# Patient Record
Sex: Female | Born: 1960
Health system: Southern US, Community
[De-identification: ages and names within clinical notes are randomized; demographics above are authoritative.]

## PROBLEM LIST (undated history)

## (undated) DIAGNOSIS — I5042 Chronic combined systolic (congestive) and diastolic (congestive) heart failure: Secondary | ICD-10-CM

## (undated) DIAGNOSIS — K219 Gastro-esophageal reflux disease without esophagitis: Secondary | ICD-10-CM

## (undated) DIAGNOSIS — E669 Obesity, unspecified: Secondary | ICD-10-CM

## (undated) DIAGNOSIS — B019 Varicella without complication: Secondary | ICD-10-CM

## (undated) DIAGNOSIS — I2721 Secondary pulmonary arterial hypertension: Secondary | ICD-10-CM

## (undated) DIAGNOSIS — M1990 Unspecified osteoarthritis, unspecified site: Secondary | ICD-10-CM

## (undated) DIAGNOSIS — G43909 Migraine, unspecified, not intractable, without status migrainosus: Secondary | ICD-10-CM

## (undated) DIAGNOSIS — I255 Ischemic cardiomyopathy: Secondary | ICD-10-CM

## (undated) DIAGNOSIS — G35 Multiple sclerosis: Secondary | ICD-10-CM

## (undated) DIAGNOSIS — I340 Nonrheumatic mitral (valve) insufficiency: Secondary | ICD-10-CM

## (undated) DIAGNOSIS — H469 Unspecified optic neuritis: Secondary | ICD-10-CM

## (undated) DIAGNOSIS — I2510 Atherosclerotic heart disease of native coronary artery without angina pectoris: Secondary | ICD-10-CM

## (undated) DIAGNOSIS — J449 Chronic obstructive pulmonary disease, unspecified: Secondary | ICD-10-CM

## (undated) DIAGNOSIS — Z8619 Personal history of other infectious and parasitic diseases: Secondary | ICD-10-CM

## (undated) DIAGNOSIS — R269 Unspecified abnormalities of gait and mobility: Secondary | ICD-10-CM

## (undated) DIAGNOSIS — H9190 Unspecified hearing loss, unspecified ear: Secondary | ICD-10-CM

## (undated) DIAGNOSIS — R51 Headache: Secondary | ICD-10-CM

## (undated) DIAGNOSIS — T7840XA Allergy, unspecified, initial encounter: Secondary | ICD-10-CM

## (undated) HISTORY — PX: OTHER SURGICAL HISTORY: SHX169

## (undated) HISTORY — DX: Unspecified abnormalities of gait and mobility: R26.9

## (undated) HISTORY — DX: Unspecified optic neuritis: H46.9

## (undated) HISTORY — DX: Multiple sclerosis: G35

## (undated) HISTORY — DX: Obesity, unspecified: E66.9

## (undated) HISTORY — DX: Headache: R51

## (undated) HISTORY — DX: Unspecified osteoarthritis, unspecified site: M19.90

## (undated) HISTORY — DX: Varicella without complication: B01.9

## (undated) HISTORY — DX: Migraine, unspecified, not intractable, without status migrainosus: G43.909

## (undated) HISTORY — PX: TUMOR REMOVAL: SHX12

## (undated) HISTORY — PX: PILONIDAL CYST EXCISION: SHX744

## (undated) HISTORY — DX: Personal history of other infectious and parasitic diseases: Z86.19

## (undated) HISTORY — DX: Allergy, unspecified, initial encounter: T78.40XA

## (undated) HISTORY — DX: Gastro-esophageal reflux disease without esophagitis: K21.9

## (undated) HISTORY — DX: Unspecified hearing loss, unspecified ear: H91.90

## (undated) HISTORY — PX: TUBAL LIGATION: SHX77

---
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
## 1999-03-07 ENCOUNTER — Ambulatory Visit (HOSPITAL_COMMUNITY): Admission: RE | Admit: 1999-03-07 | Discharge: 1999-03-07 | Payer: Self-pay | Admitting: Family Medicine

## 1999-04-05 ENCOUNTER — Ambulatory Visit (HOSPITAL_COMMUNITY): Admission: RE | Admit: 1999-04-05 | Discharge: 1999-04-05 | Payer: Self-pay | Admitting: Specialist

## 1999-04-05 ENCOUNTER — Encounter: Payer: Self-pay | Admitting: Specialist

## 1999-04-08 ENCOUNTER — Inpatient Hospital Stay (HOSPITAL_COMMUNITY): Admission: AD | Admit: 1999-04-08 | Discharge: 1999-04-11 | Payer: Self-pay | Admitting: Neurology

## 2001-03-26 ENCOUNTER — Encounter: Payer: Self-pay | Admitting: Psychiatry

## 2001-03-26 ENCOUNTER — Ambulatory Visit (HOSPITAL_COMMUNITY): Admission: RE | Admit: 2001-03-26 | Discharge: 2001-03-26 | Payer: Self-pay | Admitting: Psychiatry

## 2001-08-30 ENCOUNTER — Emergency Department (HOSPITAL_COMMUNITY): Admission: EM | Admit: 2001-08-30 | Discharge: 2001-08-30 | Payer: Self-pay | Admitting: Emergency Medicine

## 2001-10-24 ENCOUNTER — Encounter: Payer: Self-pay | Admitting: Emergency Medicine

## 2001-10-24 ENCOUNTER — Emergency Department (HOSPITAL_COMMUNITY): Admission: EM | Admit: 2001-10-24 | Discharge: 2001-10-24 | Payer: Self-pay | Admitting: Emergency Medicine

## 2002-08-12 ENCOUNTER — Other Ambulatory Visit: Admission: RE | Admit: 2002-08-12 | Discharge: 2002-08-12 | Payer: Self-pay | Admitting: Obstetrics and Gynecology

## 2003-10-25 ENCOUNTER — Ambulatory Visit (HOSPITAL_COMMUNITY): Admission: RE | Admit: 2003-10-25 | Discharge: 2003-10-25 | Payer: Self-pay | Admitting: Neurology

## 2004-05-09 ENCOUNTER — Encounter (INDEPENDENT_AMBULATORY_CARE_PROVIDER_SITE_OTHER): Payer: Self-pay | Admitting: Cardiology

## 2004-05-09 ENCOUNTER — Ambulatory Visit: Admission: RE | Admit: 2004-05-09 | Discharge: 2004-05-09 | Payer: Self-pay | Admitting: Neurology

## 2004-05-31 ENCOUNTER — Ambulatory Visit (HOSPITAL_COMMUNITY): Admission: RE | Admit: 2004-05-31 | Discharge: 2004-05-31 | Payer: Self-pay | Admitting: Neurology

## 2004-06-13 ENCOUNTER — Encounter (HOSPITAL_COMMUNITY): Admission: RE | Admit: 2004-06-13 | Discharge: 2004-09-11 | Payer: Self-pay | Admitting: Neurology

## 2004-08-27 ENCOUNTER — Encounter (INDEPENDENT_AMBULATORY_CARE_PROVIDER_SITE_OTHER): Payer: Self-pay | Admitting: Cardiology

## 2004-08-27 ENCOUNTER — Ambulatory Visit (HOSPITAL_COMMUNITY): Admission: RE | Admit: 2004-08-27 | Discharge: 2004-08-27 | Payer: Self-pay | Admitting: Psychiatry

## 2006-03-15 ENCOUNTER — Observation Stay (HOSPITAL_COMMUNITY): Admission: EM | Admit: 2006-03-15 | Discharge: 2006-03-17 | Payer: Self-pay | Admitting: Emergency Medicine

## 2006-03-16 ENCOUNTER — Ambulatory Visit: Payer: Self-pay | Admitting: Oncology

## 2006-03-17 ENCOUNTER — Ambulatory Visit: Payer: Self-pay | Admitting: Oncology

## 2006-03-19 ENCOUNTER — Ambulatory Visit (HOSPITAL_COMMUNITY): Admission: RE | Admit: 2006-03-19 | Discharge: 2006-03-19 | Payer: Self-pay | Admitting: Oncology

## 2006-04-02 ENCOUNTER — Ambulatory Visit (HOSPITAL_COMMUNITY): Admission: RE | Admit: 2006-04-02 | Discharge: 2006-04-02 | Payer: Self-pay | Admitting: Oncology

## 2006-04-02 ENCOUNTER — Encounter (INDEPENDENT_AMBULATORY_CARE_PROVIDER_SITE_OTHER): Payer: Self-pay | Admitting: *Deleted

## 2007-11-26 ENCOUNTER — Emergency Department (HOSPITAL_COMMUNITY): Admission: EM | Admit: 2007-11-26 | Discharge: 2007-11-27 | Payer: Self-pay | Admitting: Emergency Medicine

## 2008-07-07 ENCOUNTER — Encounter (HOSPITAL_COMMUNITY): Admission: RE | Admit: 2008-07-07 | Discharge: 2008-10-05 | Payer: Self-pay | Admitting: Neurology

## 2008-10-26 ENCOUNTER — Encounter (HOSPITAL_COMMUNITY): Admission: RE | Admit: 2008-10-26 | Discharge: 2009-01-24 | Payer: Self-pay | Admitting: Neurology

## 2009-02-15 ENCOUNTER — Encounter (HOSPITAL_COMMUNITY): Admission: RE | Admit: 2009-02-15 | Discharge: 2009-05-16 | Payer: Self-pay | Admitting: Neurology

## 2009-05-17 ENCOUNTER — Encounter (HOSPITAL_COMMUNITY): Admission: RE | Admit: 2009-05-17 | Discharge: 2009-08-15 | Payer: Self-pay | Admitting: Neurology

## 2009-09-06 ENCOUNTER — Encounter (HOSPITAL_COMMUNITY): Admission: RE | Admit: 2009-09-06 | Discharge: 2009-12-05 | Payer: Self-pay | Admitting: Neurology

## 2010-01-03 ENCOUNTER — Encounter (HOSPITAL_COMMUNITY): Admission: RE | Admit: 2010-01-03 | Discharge: 2010-04-03 | Payer: Self-pay | Admitting: Neurology

## 2010-04-04 ENCOUNTER — Encounter (HOSPITAL_COMMUNITY): Admission: RE | Admit: 2010-04-04 | Discharge: 2010-07-03 | Payer: Self-pay | Admitting: Neurology

## 2010-07-25 ENCOUNTER — Encounter (HOSPITAL_COMMUNITY)
Admission: RE | Admit: 2010-07-25 | Discharge: 2010-10-16 | Payer: Self-pay | Source: Home / Self Care | Admitting: Neurology

## 2010-10-17 ENCOUNTER — Encounter (HOSPITAL_COMMUNITY)
Admission: RE | Admit: 2010-10-17 | Discharge: 2011-01-07 | Payer: Self-pay | Source: Home / Self Care | Attending: Neurology | Admitting: Neurology

## 2011-01-03 ENCOUNTER — Emergency Department (HOSPITAL_COMMUNITY)
Admission: EM | Admit: 2011-01-03 | Discharge: 2011-01-04 | Payer: Self-pay | Source: Home / Self Care | Admitting: Emergency Medicine

## 2011-01-03 ENCOUNTER — Encounter: Payer: Self-pay | Admitting: Cardiology

## 2011-01-03 LAB — POCT CARDIAC MARKERS: Troponin i, poc: <0.05 ng/mL (ref 0.00–0.09)

## 2011-01-03 LAB — PROTIME-INR: INR: 1.02 (ref 0.00–1.49)

## 2011-01-03 LAB — CBC
HCT: 38.2 % (ref 36.0–46.0)
MCV: 90.5 fL (ref 78.0–100.0)
Platelets: 195 10*3/uL (ref 150–400)
RDW: 15.1 % (ref 11.5–15.5)
WBC: 10.5 10*3/uL (ref 4.0–10.5)

## 2011-01-03 LAB — BASIC METABOLIC PANEL
CO2: 22 mEq/L (ref 19–32)
Chloride: 111 mEq/L (ref 96–112)
Creatinine, Ser: 1.08 mg/dL (ref 0.4–1.2)
Glucose, Bld: 94 mg/dL (ref 70–99)
Potassium: 3.4 mEq/L — ABNORMAL LOW (ref 3.5–5.1)

## 2011-01-03 LAB — DIFFERENTIAL
Eosinophils Relative: 2 % (ref 0–5)
Lymphs Abs: 5.1 10*3/uL — ABNORMAL HIGH (ref 0.7–4.0)
Neutro Abs: 4.5 10*3/uL (ref 1.7–7.7)
Neutrophils Relative %: 43 % (ref 43–77)

## 2011-01-04 LAB — POCT CARDIAC MARKERS
Myoglobin, poc: 51.5 ng/mL (ref 12–200)
Myoglobin, poc: 46.5 ng/mL (ref 12–200)
Troponin i, poc: <0.05 ng/mL (ref 0.00–0.09)
Troponin i, poc: <0.05 ng/mL (ref 0.00–0.09)

## 2011-01-06 ENCOUNTER — Ambulatory Visit
Admission: RE | Admit: 2011-01-06 | Discharge: 2011-01-06 | Payer: Self-pay | Source: Home / Self Care | Attending: Cardiology | Admitting: Cardiology

## 2011-01-06 DIAGNOSIS — F17200 Nicotine dependence, unspecified, uncomplicated: Secondary | ICD-10-CM | POA: Insufficient documentation

## 2011-01-06 DIAGNOSIS — R072 Precordial pain: Secondary | ICD-10-CM | POA: Insufficient documentation

## 2011-01-08 ENCOUNTER — Encounter: Payer: Self-pay | Admitting: Cardiology

## 2011-01-10 ENCOUNTER — Encounter (HOSPITAL_COMMUNITY): Payer: BC Managed Care – PPO | Attending: Neurology

## 2011-01-10 DIAGNOSIS — Z79899 Other long term (current) drug therapy: Secondary | ICD-10-CM | POA: Insufficient documentation

## 2011-01-10 DIAGNOSIS — G35 Multiple sclerosis: Secondary | ICD-10-CM | POA: Insufficient documentation

## 2011-01-15 ENCOUNTER — Encounter: Payer: Self-pay | Admitting: Physician Assistant

## 2011-01-15 NOTE — Assessment & Plan Note (Signed)
Summary: NP6/PT SEEN IN [place] FOR CHEST PAIN BY DR. Rogelia Boga OUR FELLOW/LG   Visit Type:  Initial Consult Primary Provider:  Dr. Marjory Lies  CC:  CAD.  History of Present Illness: The patient presents for evaluation of chest discomfort. She was in the emergency room 2 days ago with a discomfort that started that day. It was mild and started as a stabbing discomfort but became dull and then pressure-like. It was midsternal without radiation. It was unlike previous reflux. She became mildly dizzy and slightly nauseated and then presented to the emergency room. There she had no acute changes on EKG and cardiac markers x3 were negative. She was treated with aspirin but still had some discomfort at the time of discharge from the ER. She has since had no further discomfort over the past 24 hours. She does have risk factors to include family history and tobacco. She denies any shortness of breath, PND or orthopnea. She denies any palpitations, presyncope or syncope. She is slightly limited in activities because of multiple sclerosis. However, she takes stairs and can vacuum without bringing on these symptoms.  Current Medications (verified): 1)  Mobic 15 Mg Tabs (Meloxicam) .Marland Kitchen.. 1 By Mouth Daily 2)  Zyrtec Allergy 10 Mg Tabs (Cetirizine Hcl) .... 1/2 By Mouth Daily 3)  Topiramate 50 Mg Tabs (Topiramate) .... 3 1/2 By Mouth Daily 4)  Ambien 5 Mg Tabs (Zolpidem Tartrate) .... As Needed 5)  Tysabri 300 Mg/65ml Conc (Natalizumab) .... Monthly  Allergies (verified): 1)  ! Codeine 2)  ! Prednisone  Past History:  Past Medical History: Multiple Sclerosis Tobacco user  Past Surgical History: Ganglion cyst resected (left kidney) Cyst removed from cocyx  Family History: Brother with MI age 48  Social History: Divorced, two children, recptionist Cigarettes x 30 years/1 ppd  Review of Systems       Positive for headaches, joint pains, reflux. Otherwise as stated in each eye and negative for  all other systems.  Vital Signs:  Patient profile:   50 year old female Height:      63 inches Weight:      169 pounds BMI:     30.05 Pulse rate:   78 / minute Resp:     18 per minute BP sitting:   149 / 87  (left arm)  Vitals Entered By: Marrion Coy, CNA (January 06, 2011 11:57 AM)  Physical Exam  General:  Well developed, well nourished, in no acute distress. Head:  normocephalic and atraumatic Eyes:  PERRLA/EOM intact; conjunctiva and lids normal. Mouth:  Edentulous. Oral mucosa normal. Neck:  Neck supple, no JVD. No masses, thyromegaly or abnormal cervical nodes. Chest Wall:  no deformities or breast masses noted Lungs:  Clear bilaterally to auscultation and percussion. Abdomen:  Bowel sounds positive; abdomen soft and non-tender without masses, organomegaly, or hernias noted. No hepatosplenomegaly. Msk:  Back normal, normal gait. Muscle strength and tone normal. Extremities:  No clubbing or cyanosis. Neurologic:  Alert and oriented x 3. Skin:  Intact without lesions or rashes. Cervical Nodes:  no significant adenopathy Axillary Nodes:  no significant adenopathy Inguinal Nodes:  no significant adenopathy Psych:  Normal affect.   Detailed Cardiovascular Exam  Neck    Carotids: Carotids full and equal bilaterally without bruits.      Neck Veins: Normal, no JVD.    Heart    Inspection: no deformities or lifts noted.      Palpation: normal PMI with no thrills palpable.  Auscultation: regular rate and rhythm, S1, S2 without murmurs, rubs, gallops, or clicks.    Vascular    Abdominal Aorta: no palpable masses, pulsations, or audible bruits.      Femoral Pulses: normal femoral pulses bilaterally.      Pedal Pulses: normal pedal pulses bilaterally.      Radial Pulses: normal radial pulses bilaterally.      Peripheral Circulation: no clubbing, cyanosis, or edema noted with normal capillary refill.     EKG  Procedure date:  01/03/2011  Findings:      Sinus  rhythm, rate 77, axis within normal limits, intervals within normal limits, no acute ST-T wave changes.  Impression & Recommendations:  Problem # 1:  PRECORDIAL PAIN (ICD-786.51) Her chest pain is somewhat atypical but she does have cardiovascular risk factors. Despite her MS she thinks she could walk on a treadmill. I will order an exercise treadmill test. Orders: Treadmill (Treadmill)  Problem # 2:  TOBACCO ABUSE (ICD-305.1) We spent greater than 3 minutes talking about strategies to quit smoking. She did not tolerate Chantix in the past. I have suggested patches, nicotine lozenge and the electronic cigarette.  Patient Instructions: 1)  Your physician recommends that you schedule a follow-up appointment at the time of your Treadmill 2)  Your physician recommends that you continue on your current medications as directed. Please refer to the Current Medication list given to you today. 3)  Your physician has requested that you have an exercise tolerance test.  For further information please visit [email or web address]  Please also follow instruction sheet, as given.

## 2011-02-04 NOTE — Progress Notes (Signed)
Summary: Current Med List   Current Med List   Imported By: Roderic Ovens 01/29/2011 12:55:47  _____________________________________________________________________  External Attachment:    Type:   Image     Comment:   External Document

## 2011-02-07 ENCOUNTER — Encounter (HOSPITAL_COMMUNITY)
Admission: RE | Admit: 2011-02-07 | Discharge: 2011-02-07 | Disposition: A | Discharge status: Home / Self Care | Payer: BC Managed Care – PPO | Source: Ambulatory Visit | Attending: Neurology | Admitting: Neurology

## 2011-02-07 DIAGNOSIS — G35 Multiple sclerosis: Secondary | ICD-10-CM | POA: Insufficient documentation

## 2011-02-07 DIAGNOSIS — Z79899 Other long term (current) drug therapy: Secondary | ICD-10-CM | POA: Insufficient documentation

## 2011-02-24 LAB — COMPREHENSIVE METABOLIC PANEL
ALT: 12 U/L (ref 0–35)
AST: 12 U/L (ref 0–37)
Albumin: 3.6 g/dL (ref 3.5–5.2)
Alkaline Phosphatase: 51 U/L (ref 39–117)
CO2: 23 mEq/L (ref 19–32)
GFR calc non Af Amer: 55 mL/min — ABNORMAL LOW (ref >60)
Glucose, Bld: 110 mg/dL — ABNORMAL HIGH (ref 70–99)
Total Bilirubin: 0.2 mg/dL — ABNORMAL LOW (ref 0.3–1.2)

## 2011-02-24 LAB — CBC
Hemoglobin: 12.4 g/dL (ref 12.0–15.0)
MCHC: 34 g/dL (ref 30.0–36.0)
MCV: 87.6 fL (ref 78.0–100.0)
RDW: 16.5 % — ABNORMAL HIGH (ref 11.5–15.5)

## 2011-02-24 LAB — DIFFERENTIAL
Basophils Absolute: 0.1 10*3/uL (ref 0.0–0.1)
Eosinophils Relative: 3 % (ref 0–5)
Lymphocytes Relative: 39 % (ref 12–46)
Lymphs Abs: 3.5 10*3/uL (ref 0.7–4.0)
Monocytes Absolute: 0.6 10*3/uL (ref 0.1–1.0)
Neutrophils Relative %: 50 % (ref 43–77)

## 2011-03-07 ENCOUNTER — Encounter (HOSPITAL_COMMUNITY): Payer: BC Managed Care – PPO

## 2011-04-04 ENCOUNTER — Encounter (HOSPITAL_COMMUNITY)
Admission: RE | Admit: 2011-04-04 | Discharge: 2011-04-04 | Disposition: A | Discharge status: Home / Self Care | Payer: BC Managed Care – PPO | Source: Ambulatory Visit | Attending: Neurology | Admitting: Neurology

## 2011-04-04 DIAGNOSIS — G35 Multiple sclerosis: Secondary | ICD-10-CM | POA: Insufficient documentation

## 2011-04-25 NOTE — H&P (Signed)
Anita [name], Anita [name]         ACCOUNT NO.:  [id number]   MEDICAL RECORD NO.:  [id number]          PATIENT TYPE:  INP   LOCATION:  0102                         FACILITY:  [facility]   PHYSICIAN:  Lonia Blood, M.D.DATE OF BIRTH:  [birth year]   DATE OF ADMISSION:  03/15/2006  DATE OF DISCHARGE:                                HISTORY & PHYSICAL   PRIMARY CARE PHYSICIAN:  Dr. Doristine Counter at [facility].   NEUROLOGIST:  Dr. Marlan Palau.   CHIEF COMPLAINT:  Abdominal pain.   HISTORY OF PRESENT ILLNESS:  [name] is a 50 year old  female who presented to the [facility] Emergency Room with  complaints of right lower quadrant, sharp continuous type abdominal pain.  She reports that she was diagnosed with chronic appendicitis in her late  teens and has had intermittent migratory abdominal pain since that time.  Therefore, when her pain began this morning at around 9 she paid little  attention to it.  She reports that her usual abdominal discomfort, however,  is self-limited and disappears within a number of minutes if not hours at  most.  The patient's pain today was different because it was continuous.  It  did not radiate.  It did not improve.  Because her pain continued all  morning, the family decided that she should be evaluated and encouraged her  to present to the emergency room.  In the emergency room, urinalysis was  done which was not consistent with a significant urinary tract infection.  Urine pregnancy was found to be negative.  CT scan of the abdomen was  obtained and revealed a large left-sided retroperitoneal mass.  I am asked  to evaluate the patient for this issue.   At the present time, the patient's abdominal discomfort has resolved.  She  says that it has responded well to the Dilaudid she was given in the  emergency room.  The patient is not nauseated or vomiting.  She is not  having diarrhea or severe constipation.   There has been no unintentional  weight loss or weight gain.  There has been no change in appetite.  There  have been no flushing episodes.  There has been no unexplained diaphoresis.  There have been no palpitations or extreme nervousness.  The patient's  family reports that she has been in her usual state of health and agrees  with the patient in supporting this.   REVIEW OF SYSTEMS:  Comprehensive review of systems is wholly unremarkable  with the exception that was noted in the history of present illness above.   PAST MEDICAL HISTORY:  1.  Multiple sclerosis which is presently quiescent and for which the      patient takes no medications.  2.  Tobacco abuse in the amount of one pack per day since the patient was a      teenager.  3.  Chronic appendicitis since late teens.  4.  Right knee cyst followed by Dr. Priscille Kluver.   MEDICATIONS:  1.  Relafen.  2.  Allegra.   ALLERGIES:  CODEINE and PREDNISONE.   FAMILY HISTORY:  The  patient's mother is alive and has hypertension,  Meniere's disease and diet controlled diabetes.  The patient's father is  alive and has COPD.  The patient has a brother with coronary artery disease  who suffered a very large MI in his early 60s.  The patient has a sister who  was recently diagnosed with possibly ovarian cancer.  The patient has  another sister who is bipolar and committed suicide.   SOCIAL HISTORY:  The patient lives in the [place] area.  She does not  drink.   LABORATORY DATA:  Urinalysis reveals moderate hemoglobin, 0 to 2 white blood  cells and red cells, and small leukocyte esterase.  Urine pregnancy is  negative.  CT scan of the abdomen reveals a large retroperitoneal mass  displacing the left adrenal gland/kidney measuring 6.6 x 6 cm roughly.  Hemoglobin, white count, platelet count, MCV are all normal.  Electrolytes  are normal and balanced.  Serum glucose is normal.  Albumin is 3.6 with  total protein of 6.7.  Coagulases are  normal.   PHYSICAL EXAMINATION:  VITAL SIGNS:  Temperature 99.1, blood pressure  149/98, respiratory rate 20, heart rate 76, O2 saturation 99% on room air.  Weight is 190.  GENERAL:  Well-developed, well-nourished mildly overweight female in no  acute respiratory distress.  HEENT:  Normocephalic and atraumatic.  Pupils are equal, round, and reactive  to light and accommodation.  Extraocular movements intact bilaterally.  OC/OP clear.  NECK:  No JVD and no lymphadenopathy.  LUNGS:  Clear to auscultation bilaterally without wheezes or rhonchi.  CARDIOVASCULAR:  Regular rate and rhythm without murmur, rub, or gallop.  Normal S1 and S2.  ABDOMEN:  Mildly overweight.  Bowel sounds positive.  No hepatosplenomegaly,  no rebound and no ascites.  EXTREMITIES:  No significant clubbing, cyanosis, or edema bilateral lower  extremities.  NEUROLOGICAL:  Nonfocal.  The patient was alert and oriented x4.  Cranial  nerves II through XII intact bilaterally.  5/5 strength is displayed in  bilateral upper and lower extremities.   IMPRESSION/PLAN:  1.  Large retroperitoneal mass:  This definitely has the appearance of a      neoplastic lesion.  Primary elements on the differential would be      pheochromocytoma, lymphoma or other solid organ tumors.  We will begin      workup with 24-hour urine for metanephrine and catecholamines.  If this      does not reveal evidence of pheochromocytoma, we will then pursue CT      scan guided biopsy of the lesion. I am holding off on further scanning      until we have a tissue biopsy on the off chance that this does not      represent a neoplasm.  I have spent lengthy amount of time at the      patient's bedside discussing the possibility and the differential and      detailing possible courses of treatment to be pursued based upon our      results.  2.  Multiple sclerosis:  The patient's multiple sclerosis appears to be very     well controlled at present.  The  patient is not on any multiple      sclerosis altering medications.  We will simply follow the patient.  If      her multiple sclerosis becomes active, we will consult Dr. Anne Hahn for      evaluation.  3.  Tobacco abuse:  I have not  counseled the patient concerning her tobacco      abuse at present because of the weight of the recent diagnosis that I      have just given her.  We will encourage this, however, during her      hospital stay.      Lonia Blood, M.D.  Electronically Signed     JTM/MEDQ  D:  03/15/2006  T:  03/16/2006  Job:  [id number]   cc:   Marjory Lies, M.D.  Fax: [phone]   C. Lesia Sago, M.D.  Fax: [phone]

## 2011-04-25 NOTE — Consult Note (Signed)
Anita [name], Anita [name]         ACCOUNT NO.:  [id number]   MEDICAL RECORD NO.:  [id number]          PATIENT TYPE:  INP   LOCATION:  1416                         FACILITY:  [facility]   PHYSICIAN:  Pierce Crane, M.D.      DATE OF BIRTH:  26-Aug-1961   DATE OF CONSULTATION:  03/19/2006  DATE OF DISCHARGE:  03/17/2006                                   CONSULTATION   CONSULTING PHYSICIAN:  Dr. Donnie Coffin   REASON FOR CONSULTATION:  Retroperitoneal mass.   REFERRING PHYSICIAN:  Dr. Lavera Guise   HISTORY OF PRESENT ILLNESS:  Anita [name] is a pleasant 50 year old white  female with a history of multiple sclerosis and a history of tobacco use  found to have a large retroperitoneal mass displacing the left adrenal gland  and kidney measuring about 6.6 x 6 cm suspicious for neoplasm as she was  being admitted with recurrent abdominal pain for which a CT of the abdomen  had been performed.  Few small retroperitoneal lymph nodes were identified.  Per radiology report differential diagnosis would include retroperitoneal  sarcoma versus lymphoma.  CT of the pelvis was negative.  No other radiology  studies are available for review at this time.  No tissue diagnosis at this  time.  We were asked to see the patient anticipating that she will need  follow-up regarding potential disease.   PAST MEDICAL HISTORY:  1.  Multiple sclerosis on no medications diagnosed in May of 2000.  2.  History of what is described by her as chronic appendicitis since the      1980s.  3.  Chronic tobacco use.  4.  Right knee pilonidal cyst followed by Dr. Priscille Kluver  5.  Seasonal allergies.  6.  History of optic neuritis in the past.  7.  Insomnia.   SURGERY:  Status post pilonidal cyst removal 20 years ago.   ALLERGIES:  CODEINE and PREDNISONE.   CURRENT MEDICATIONS:  Dulcolax, Dilaudid, Ativan, Maalox, Zofran,  Roxicodone, and Phenergan p.r.n.  The patient was on no medications on  admission.   REVIEW OF SYSTEMS:   Remarkable for fatigue, migraine headaches, and  intermittent abdominal pain secondary for what she describes as chronic  appendicitis.  No medical records available for review regarding this  diagnosis.  No GI or GU complaints.  She continues to smoke.   FAMILY HISTORY:  Mother alive with a history of diabetes.  Father alive with  COPD.  One sister alive with a history of ovarian cancer.  One sister died  secondary to bipolar disorder complicated with suicide.  One brother alive  status post MI in his 70s.  No history of lymphoma in the family.   SOCIAL HISTORY:  Patient is married.  She is working the account in  department of a firm.  No alcohol history.  She smokes one pack of tobacco a  day for at least 20 years.  Lives in [place].   PHYSICAL EXAMINATION:  GENERAL:  This is a moderately obese 50 year old  white female in no acute distress, alert and oriented x3.  VITAL SIGNS:  Blood pressure 108/65, pulse 74, respirations 17,  temperature  97.8, pulse oxygen saturation 98% on room air, weight 190 pounds, height 5  feet 4 inches.  HEENT:  Normocephalic, atraumatic.  PERRLA.  Mucous without thrush or  lesions.  NECK:  Supple.  No cervical or supraclavicular masses.  LUNGS:  Clear to auscultation bilaterally.  No axillary masses.  CARDIOVASCULAR:  Regular rate and rhythm without murmurs, rubs, or gallops.  ABDOMEN:  Soft, nontender.  Bowel sounds x4.  Cannot palpate the  retroperitoneal mass mentioned.  No palpable spleen or liver.  GENITOURINARY:  Deferred.  RECTAL:  Deferred.  EXTREMITIES:  No clubbing or cyanosis.  No edema.  SKIN:  Without lesions, bruising, or petechiae.  NEUROLOGIC:  Nonfocal.   Last hemoglobin 13.3, hematocrit 38.8, white count 9.8, platelets 271,  neutrophils 5.1, MCV 83.3. PT 12.3, PTT 12.8, INR 0.9.  LDH 131.  Sodium  139, potassium 3.7, BUN 9, creatinine 1, glucose 90, total bilirubin 0.6,  alkaline phosphatase 66, AST 19, ALT 18, total protein 6.7,  albumin 3.6,  calcium 9.4.  CEA 1.9.  Pregnancy test negative.  UA shows moderate blood.  Urinary culture negative.  Metanephrines are pending.   ASSESSMENT/PLAN:  Dr. Donnie Coffin has seen and evaluated the patient and the chart  has been reviewed.  This is a 50 year old white female asked to see for  evaluation of a retroperitoneal mass suspicious for neoplasm.  Dr. Donnie Coffin has  recommended that a tissue diagnosis because made in order to proceed with  recommendations.  She may benefit from a PET CT done at [facility]  to rule out occult malignancy.  If the patient is being discharged prior to  all the results being available I will be glad to follow the patient in  outpatient basis at the [facility], once again, if the diagnosis  of malignancy has been made.  Thank you very much for allowing [place] the  opportunity to participate in the care of Anita [name].      Marlowe Kays, P.A.      Pierce Crane, M.D.  Electronically Signed    SW/MEDQ  D:  03/19/2006  T:  03/19/2006  Job:  [id number]   cc:   Marlan Palau, M.D.  Fax: [phone]   Marjory Lies, M.D.  Fax: [phone]

## 2011-04-25 NOTE — Discharge Summary (Signed)
NAMECLEONE, [name]         ACCOUNT NO.:  [id number]   MEDICAL RECORD NO.:  [id number]          PATIENT TYPE:  INP   LOCATION:  1416                         FACILITY:  [facility]   PHYSICIAN:  Lonia Blood, M.D.       DATE OF BIRTH:  1961-02-05   DATE OF ADMISSION:  03/15/2006  DATE OF DISCHARGE:  03/17/2006                                 DISCHARGE SUMMARY   PRIMARY CARE PHYSICIAN:  [facility].   DISCHARGE DIAGNOSES:  1.  Left retroperitoneal mass.  2.  Abdominal pain, resolved.  3.  Multiple sclerosis.  4.  Tobacco abuse.  5.  History of chronic appendicitis.   DISCHARGE MEDICATIONS:  Allegra daily.   PROCEDURES DURING ADMISSION:  Patient had a computerized tomography scan of  the abdomen that showed a left retroperitoneal mass.   CONSULTATIONS:  Patient was seen in consultation by Dr. Donnie Coffin with oncology.   CONDITION ON DISCHARGE:  Anita [name] was discharged in good condition.   At the time of discharge, she was instructed to follow up with Dr. Donnie Coffin  with oncology.  She will also have a PET CT done at [facility] if  the insurance company will allow it.  Patient will need to follow up with  her primary care physician as well as Dr. Donnie Coffin for the results of the 24-  hour urine collection for metanephrines, mandelic acid, and creatinine.   For admission history and physical, refer to the dictated H&P done by Dr.  Sharon Seller.   HOSPITAL COURSE:  Problem 1:  Left retroperitoneal mass:  Anita [name] was  admitted, and she had a 24-hour urine collection for metanephrines.  She was  seen in consultation by Dr. Donnie Coffin from oncology.  At this point in time, the  workup for her left retroperitoneal mass will be completed as an outpatient  with possible biopsy versus  excision versus further testing, PET scan.  Problem 2:  Abdominal pain:  Anita [name] had no recurrence of her  abdominal pain in the hospital.  Problem 3:  Multiple sclerosis:  [name] will continue her treatment  as needed with Dr. Anne Hahn.      Lonia Blood, M.D.  Electronically Signed     SL/MEDQ  D:  03/17/2006  T:  03/17/2006  Job:  [id number]   cc:   Pierce Crane, M.D.  Fax: [phone]   Marjory Lies, M.D.  Fax: [phone]   C. Lesia Sago, M.D.  Fax: [phone]

## 2011-05-02 ENCOUNTER — Encounter (HOSPITAL_COMMUNITY): Payer: BC Managed Care – PPO

## 2011-09-12 LAB — CBC
HCT: 37.9
Hemoglobin: 13.4
MCHC: 35.3
Platelets: 288
RBC: 4.41
WBC: 10.2

## 2011-09-12 LAB — COMPREHENSIVE METABOLIC PANEL
ALT: 16
AST: 17
CO2: 24
Calcium: 9.3
Chloride: 105
Potassium: 3.6
Sodium: 137

## 2011-09-12 LAB — DIFFERENTIAL
Basophils Relative: 0
Lymphs Abs: 3.4
Neutro Abs: 6

## 2012-02-19 DIAGNOSIS — Z5181 Encounter for therapeutic drug level monitoring: Secondary | ICD-10-CM | POA: Insufficient documentation

## 2012-03-26 DIAGNOSIS — G35 Multiple sclerosis: Secondary | ICD-10-CM | POA: Insufficient documentation

## 2012-03-26 DIAGNOSIS — R209 Unspecified disturbances of skin sensation: Secondary | ICD-10-CM | POA: Insufficient documentation

## 2012-03-26 DIAGNOSIS — M549 Dorsalgia, unspecified: Secondary | ICD-10-CM | POA: Insufficient documentation

## 2012-03-26 DIAGNOSIS — M79609 Pain in unspecified limb: Secondary | ICD-10-CM | POA: Insufficient documentation

## 2012-03-26 DIAGNOSIS — H469 Unspecified optic neuritis: Secondary | ICD-10-CM | POA: Insufficient documentation

## 2013-02-24 ENCOUNTER — Telehealth: Payer: Self-pay | Admitting: Neurology

## 2013-02-24 NOTE — Telephone Encounter (Signed)
Our old records indicate that the patient has an intolerance to prednisone, not an allergy. This was documented in may of 2011, but I see nothing that correlates with any conversation with the patient at around that time. I have asked the home health nurse to reduce the Solu-Medrol dose to 250 mg daily for 4 days, rather than the 500 mg dose. They will contact me if there are any problems.

## 2013-02-24 NOTE — Telephone Encounter (Signed)
Larita Fife called from [facility] and wanted to relay that pt has allerg to prednisone.  Wanting clarification about giving the IV solumedrol to the pt.  I called and spoke to pt and then Lyla Son, RN from [facility] at the home and pt has had type of rage, psychosis? , pt found in bed lying looking like in shock when she has had oral prednisone before.  I consulted Dr. Anne Hahn and he is to call and speak to pt and Lyla Son, Charity fundraiser.

## 2013-02-28 ENCOUNTER — Telehealth: Payer: Self-pay | Admitting: Neurology

## 2013-02-28 NOTE — Telephone Encounter (Addendum)
Anita [name] called and is letting us know that pt did improve with IV solumedrol treatments.  (250mg  IV x 4 days).  Is ambulating now along with upper thigh pain dissipated.  Pt wanting to go back to work [employer]).  FYI

## 2013-02-28 NOTE — Telephone Encounter (Signed)
Pt is back at work.  I called pt at work.   She was away from desk.   She may call if needed. ssy

## 2013-02-28 NOTE — Telephone Encounter (Signed)
Noted. The patient may return to work if she feels ready.

## 2013-03-02 ENCOUNTER — Telehealth: Payer: Self-pay | Admitting: *Deleted

## 2013-03-02 ENCOUNTER — Ambulatory Visit (INDEPENDENT_AMBULATORY_CARE_PROVIDER_SITE_OTHER): Payer: BC Managed Care – PPO | Admitting: Neurology

## 2013-03-02 ENCOUNTER — Encounter: Payer: Self-pay | Admitting: Neurology

## 2013-03-02 VITALS — BP 143/98 | HR 109

## 2013-03-02 DIAGNOSIS — M549 Dorsalgia, unspecified: Secondary | ICD-10-CM

## 2013-03-02 DIAGNOSIS — H469 Unspecified optic neuritis: Secondary | ICD-10-CM

## 2013-03-02 DIAGNOSIS — M79609 Pain in unspecified limb: Secondary | ICD-10-CM

## 2013-03-02 DIAGNOSIS — Z5181 Encounter for therapeutic drug level monitoring: Secondary | ICD-10-CM

## 2013-03-02 DIAGNOSIS — G35 Multiple sclerosis: Secondary | ICD-10-CM

## 2013-03-02 DIAGNOSIS — R209 Unspecified disturbances of skin sensation: Secondary | ICD-10-CM

## 2013-03-02 MED ORDER — DEXAMETHASONE 0.5 MG PO TABS: ORAL_TABLET | ORAL | Status: DC

## 2013-03-02 NOTE — Progress Notes (Signed)
   Reason for visit: Multiple sclerosis  Anita [name] is an 53 y.o. female  History of present illness:  Anita [name] is a 52 year old right-handed white female with a history of multiple sclerosis. The patient in the past had been on Tysabri, but she was taken off the medication as she was JC antibody positive. The patient has elected not to be on any medications for her multiple sclerosis. The patient has done relatively well over the last year, but within the last 3 weeks, the patient began having issues with ambulation. The patient feels as if there is some weakness of the right greater than left lower extremities. The patient has not had any falls, but it is difficult for her to ambulate. The patient has had some urinary frequency, but no urinary incontinence. The patient has had some constipation problems. The patient denies any changes in the way her arms are working, and she denies any visual changes, speech changes, or problems swallowing. The patient was placed on a four-day course of Solu-Medrol, and she had excellent benefit, but she has since relapsed over the last 1 or 2 days. The patient returns to this office for an evaluation.  ROS:  Out of a complete 14 system review of symptoms, the patient complains only of the following symptoms, and all other reviewed systems are negative.  Gait disturbance Weakness Constipation  Blood pressure 143/98, pulse 109.  Physical Exam  General: The patient is alert and cooperative at the time of the examination. The patient is moderately obese.  Skin: No significant peripheral edema is noted.   Neurologic Exam  Cranial nerves: Facial symmetry is present. Speech is normal, no aphasia or dysarthria is noted. Extraocular movements are full. Visual fields are full. Pupils are equal, round, and reactive to light. Discs are flat bilaterally.  Motor: The patient has good strength in all 4 extremities to direct  testing.  Coordination: The patient has good finger-nose-finger and heel-to-shin bilaterally.  Gait and station: The patient has a wide-based gait, unsteady and slow. Tandem gait is poor. Romberg is negative, but it is unsteady.. No drift is seen.  Reflexes: Deep tendon reflexes are symmetric.   Assessment/Plan:  1. Multiple sclerosis  2. Gait disturbance  The patient will require another tapering course of steroids. In the past, the patient has been intolerant of prednisone. The patient will be placed on dexamethasone with a taper over 10-12 days. The patient will need to decide about instituting a disease modifying medication. We have discussed possibly going on Gilenya. Information was given to the patient, and she is to contact our office in the near future about her decision to initiate the medication. The patient will followup through this office in 3 months. If the gait does not improve, physical therapy will be ordered. Blood work will be done today.  Marlan Palau MD 03/02/2013 4:23 PM

## 2013-03-02 NOTE — Telephone Encounter (Signed)
The patient will be seen in our office today. Please refer to this revisit note.

## 2013-03-02 NOTE — Patient Instructions (Signed)
  Dexamethasone taper over the next 10 to 12 days. Need to decide on therapy for the MS.

## 2013-03-02 NOTE — Telephone Encounter (Signed)
Patient called stating she needs to speak with physician concerning her legs because she thinks she back to square one when the flare up begin.

## 2013-03-14 ENCOUNTER — Telehealth: Payer: Self-pay | Admitting: *Deleted

## 2013-03-14 DIAGNOSIS — G35 Multiple sclerosis: Secondary | ICD-10-CM

## 2013-03-14 MED ORDER — CORTICOTROPIN 80 UNIT/ML IJ GEL: 80.0000 [IU] | Freq: Every day | INTRAMUSCULAR | Status: DC

## 2013-03-14 NOTE — Telephone Encounter (Signed)
I called patient. The patient is having ongoing problems with leg weakness, and difficulty with the arm on the right. The patient is finding it difficult to work. The patient indicated no benefit from the dexamethasone. We will give a trial on ACTHAR. The patient will receive 80 units intramuscularly daily for 2 weeks. The patient may pursue disability through work. The patient needs to make a decision on her medical therapy, possibly going on Gilenya. The patient has not been on any disease modifying agents for some time.

## 2013-03-14 NOTE — Telephone Encounter (Signed)
Patient called stating she is finishing the steroid and her condition isn't getting any better. Patient is having problems with her right arm, and having congnitive issues.

## 2013-04-12 ENCOUNTER — Telehealth: Payer: Self-pay | Admitting: Neurology

## 2013-04-12 DIAGNOSIS — G35 Multiple sclerosis: Secondary | ICD-10-CM

## 2013-04-12 NOTE — Telephone Encounter (Signed)
Pt here has questions about gilenya.  She was told that she would be starting gilenya.    Anita [name], gave her information about Velora Heckler, had her sign service request form.  S he needs to have lab work drawn, this she would do today.  Needs orders for cardiac consult and opthamology exam.  Gave letter to her for her eye doctor re: gilenya.  Form filled out and will fax today to [employer].

## 2013-04-12 NOTE — Addendum Note (Signed)
Addended by: Guy Begin on: 04/12/2013 02:02 PM   Modules accepted: Orders

## 2013-04-12 NOTE — Telephone Encounter (Signed)
Orders done for pt relating to gilenya workup.  (amb referral for cardiology and opthamology).

## 2013-04-13 ENCOUNTER — Telehealth: Payer: Self-pay | Admitting: Neurology

## 2013-04-13 NOTE — Telephone Encounter (Signed)
No one call the patient.

## 2013-04-14 ENCOUNTER — Ambulatory Visit: Payer: BC Managed Care – PPO | Attending: Neurology | Admitting: Physical Therapy

## 2013-04-14 DIAGNOSIS — R269 Unspecified abnormalities of gait and mobility: Secondary | ICD-10-CM | POA: Insufficient documentation

## 2013-04-14 DIAGNOSIS — M6281 Muscle weakness (generalized): Secondary | ICD-10-CM | POA: Insufficient documentation

## 2013-04-14 DIAGNOSIS — IMO0001 Reserved for inherently not codable concepts without codable children: Secondary | ICD-10-CM | POA: Insufficient documentation

## 2013-04-14 DIAGNOSIS — Z9181 History of falling: Secondary | ICD-10-CM | POA: Insufficient documentation

## 2013-04-15 ENCOUNTER — Telehealth: Payer: Self-pay | Admitting: Neurology

## 2013-04-20 ENCOUNTER — Ambulatory Visit: Payer: BC Managed Care – PPO

## 2013-04-20 ENCOUNTER — Encounter: Payer: Self-pay | Admitting: Neurology

## 2013-04-20 ENCOUNTER — Telehealth: Payer: Self-pay

## 2013-04-20 NOTE — Telephone Encounter (Signed)
Catina from [employer] called and left a message saying Acthar was denied by insurance.  They would like a letter of medical necessity to try and appeal denial, if the provider wishes to proceed with appeal.  If the letter is written, they would like ot faxed to [phone].  Any questions, call back number is [phone]

## 2013-04-20 NOTE — Telephone Encounter (Signed)
I'll write a letter of medical necessity.

## 2013-04-22 ENCOUNTER — Ambulatory Visit: Payer: BC Managed Care – PPO

## 2013-04-27 ENCOUNTER — Ambulatory Visit: Payer: BC Managed Care – PPO

## 2013-04-29 ENCOUNTER — Ambulatory Visit: Payer: BC Managed Care – PPO | Admitting: Physical Therapy

## 2013-05-04 ENCOUNTER — Ambulatory Visit: Payer: BC Managed Care – PPO | Admitting: Physical Therapy

## 2013-05-06 ENCOUNTER — Ambulatory Visit: Payer: BC Managed Care – PPO | Admitting: Physical Therapy

## 2013-05-06 ENCOUNTER — Telehealth: Payer: Self-pay | Admitting: *Deleted

## 2013-05-06 NOTE — Telephone Encounter (Signed)
Noted. The patient will hopefully contact her office when she desires to go on Gilenya.

## 2013-05-06 NOTE — Telephone Encounter (Signed)
Toniann Fail from [employer] in today to go over [place] work up progress.   She relayed that pt stated to her that she was not going to proceed with gilenya at this time due to could not handle all that was going on in her life at this time.  Case closed, can reopen if and when pt decides further course in the future.  FYI

## 2013-05-09 ENCOUNTER — Ambulatory Visit: Payer: BC Managed Care – PPO | Attending: Neurology | Admitting: Physical Therapy

## 2013-05-09 DIAGNOSIS — R269 Unspecified abnormalities of gait and mobility: Secondary | ICD-10-CM | POA: Insufficient documentation

## 2013-05-09 DIAGNOSIS — M6281 Muscle weakness (generalized): Secondary | ICD-10-CM | POA: Insufficient documentation

## 2013-05-09 DIAGNOSIS — Z9181 History of falling: Secondary | ICD-10-CM | POA: Insufficient documentation

## 2013-05-09 DIAGNOSIS — IMO0001 Reserved for inherently not codable concepts without codable children: Secondary | ICD-10-CM | POA: Insufficient documentation

## 2013-05-11 ENCOUNTER — Ambulatory Visit: Payer: BC Managed Care – PPO | Admitting: Occupational Therapy

## 2013-05-11 ENCOUNTER — Ambulatory Visit: Payer: BC Managed Care – PPO | Admitting: Speech Pathology

## 2013-05-11 ENCOUNTER — Ambulatory Visit: Payer: BC Managed Care – PPO | Admitting: Physical Therapy

## 2013-05-16 ENCOUNTER — Ambulatory Visit: Payer: BC Managed Care – PPO | Admitting: Physical Therapy

## 2013-05-16 ENCOUNTER — Telehealth: Payer: Self-pay | Admitting: Nurse Practitioner

## 2013-05-16 NOTE — Telephone Encounter (Signed)
Debbie from [facility] [phone] is calling to tell us she needs someone to call and authorize her new medication.  ASAP please

## 2013-05-16 NOTE — Telephone Encounter (Signed)
Rx has been faxed to Pharmacy at [phone] per Lafonda Mosses at [facility]

## 2013-05-17 ENCOUNTER — Telehealth: Payer: Self-pay | Admitting: Nurse Practitioner

## 2013-05-17 NOTE — Telephone Encounter (Signed)
Please call Debbie back regarding this prescription---Acthar HP 80 unit/ML injection---conflicting directions.

## 2013-05-18 ENCOUNTER — Ambulatory Visit: Payer: BC Managed Care – PPO | Admitting: Physical Therapy

## 2013-05-18 ENCOUNTER — Ambulatory Visit: Payer: BC Managed Care – PPO | Admitting: Occupational Therapy

## 2013-05-20 ENCOUNTER — Ambulatory Visit: Payer: BC Managed Care – PPO | Admitting: *Deleted

## 2013-05-20 ENCOUNTER — Ambulatory Visit: Payer: BC Managed Care – PPO | Admitting: Occupational Therapy

## 2013-05-23 ENCOUNTER — Ambulatory Visit: Payer: BC Managed Care – PPO

## 2013-05-25 ENCOUNTER — Ambulatory Visit: Payer: BC Managed Care – PPO | Admitting: Physical Therapy

## 2013-05-25 ENCOUNTER — Ambulatory Visit: Payer: BC Managed Care – PPO

## 2013-05-30 ENCOUNTER — Ambulatory Visit: Payer: BC Managed Care – PPO | Admitting: Physical Therapy

## 2013-06-01 ENCOUNTER — Ambulatory Visit: Payer: BC Managed Care – PPO | Admitting: Physical Therapy

## 2013-06-06 ENCOUNTER — Ambulatory Visit: Payer: BC Managed Care – PPO | Admitting: Physical Therapy

## 2013-06-08 ENCOUNTER — Ambulatory Visit: Payer: BC Managed Care – PPO | Attending: Neurology | Admitting: Occupational Therapy

## 2013-06-08 ENCOUNTER — Ambulatory Visit: Payer: BC Managed Care – PPO | Admitting: Physical Therapy

## 2013-06-08 ENCOUNTER — Ambulatory Visit: Payer: BC Managed Care – PPO | Admitting: Speech Pathology

## 2013-06-08 DIAGNOSIS — R269 Unspecified abnormalities of gait and mobility: Secondary | ICD-10-CM | POA: Insufficient documentation

## 2013-06-08 DIAGNOSIS — M6281 Muscle weakness (generalized): Secondary | ICD-10-CM | POA: Insufficient documentation

## 2013-06-08 DIAGNOSIS — IMO0001 Reserved for inherently not codable concepts without codable children: Secondary | ICD-10-CM | POA: Insufficient documentation

## 2013-06-08 DIAGNOSIS — Z9181 History of falling: Secondary | ICD-10-CM | POA: Insufficient documentation

## 2013-06-13 ENCOUNTER — Ambulatory Visit: Payer: BC Managed Care – PPO | Admitting: Physical Therapy

## 2013-06-13 ENCOUNTER — Ambulatory Visit: Payer: BC Managed Care – PPO | Admitting: Occupational Therapy

## 2013-06-13 ENCOUNTER — Ambulatory Visit: Payer: BC Managed Care – PPO | Admitting: Speech Pathology

## 2013-06-15 ENCOUNTER — Ambulatory Visit: Payer: BC Managed Care – PPO | Admitting: Occupational Therapy

## 2013-06-15 ENCOUNTER — Ambulatory Visit: Payer: BC Managed Care – PPO | Admitting: Physical Therapy

## 2013-06-15 ENCOUNTER — Ambulatory Visit: Payer: BC Managed Care – PPO

## 2013-06-20 ENCOUNTER — Ambulatory Visit: Payer: BC Managed Care – PPO

## 2013-06-20 ENCOUNTER — Ambulatory Visit: Payer: BC Managed Care – PPO | Admitting: Occupational Therapy

## 2013-06-22 ENCOUNTER — Ambulatory Visit: Payer: BC Managed Care – PPO | Admitting: Occupational Therapy

## 2013-06-22 ENCOUNTER — Ambulatory Visit: Payer: BC Managed Care – PPO

## 2013-06-28 ENCOUNTER — Telehealth: Payer: Self-pay | Admitting: *Deleted

## 2013-06-28 NOTE — Telephone Encounter (Signed)
Medication reaction to Acthar noted.

## 2013-06-28 NOTE — Telephone Encounter (Signed)
Received call from [employer] about not being able to get in touch with pt about upcoming deliver of med. Pt then proceeded to tell me that she only took for 10days and then had a reaction of throat swelling and coughing up blood.  I called and spoke to [facility] with [employer] and gave her the information pt is not taking anymore and had adverse reaction.  Gave her as much information about reaction and details as I could from pts chart and her conversation with me.

## 2013-07-06 ENCOUNTER — Other Ambulatory Visit: Payer: Self-pay | Admitting: Neurology

## 2013-07-26 ENCOUNTER — Ambulatory Visit (INDEPENDENT_AMBULATORY_CARE_PROVIDER_SITE_OTHER): Payer: BC Managed Care – PPO | Admitting: Neurology

## 2013-07-26 ENCOUNTER — Encounter: Payer: Self-pay | Admitting: Neurology

## 2013-07-26 VITALS — BP 150/93 | HR 74 | Wt 147.0 lb

## 2013-07-26 DIAGNOSIS — G35 Multiple sclerosis: Secondary | ICD-10-CM

## 2013-07-26 DIAGNOSIS — Z5181 Encounter for therapeutic drug level monitoring: Secondary | ICD-10-CM

## 2013-07-26 DIAGNOSIS — R269 Unspecified abnormalities of gait and mobility: Secondary | ICD-10-CM

## 2013-07-26 HISTORY — DX: Unspecified abnormalities of gait and mobility: R26.9

## 2013-07-26 MED ORDER — MELOXICAM 15 MG PO TABS: 15.0000 mg | ORAL_TABLET | Freq: Every day | ORAL | Status: DC

## 2013-07-26 NOTE — Progress Notes (Signed)
Reason for visit: Multiple sclerosis  Anita [name] is an 52 y.o. female  History of present illness:  Anita [name] is a 52 year old right-handed white female with a history of multiple sclerosis. The patient has not wished to be on disease modifying agents over the last several years, and she has begun to have significant progression of her disease within the last 6 months. The patient has developed a gait disorder, and she likely is developing some mild cognitive impairment as well. The patient reports some occasional fecal incontinence. The patient indicates that the bladder is working relatively well. The patient is now using a walker since May of 2014. The patient went on ACTHAR, and she had swelling in the throat and coughing up blood. The patient had to stop this medication. The patient was set up for Gilenya, but she decided not to go on this medication. The patient remains off of all disease modifying agents. The patient returns for an evaluation. The patient is now not employed, and she indicates that she will be losing her health insurance within the next week.   Past Medical History  Diagnosis Date  . Multiple sclerosis   . Obese   . Headache(784.0)     Migraine  . Hearing loss     Right ear secondary to infection  . Abnormality of gait 07/26/2013  . History of shingles     Past Surgical History  Procedure Laterality Date  . Pilonidal cyst excision    . Ganglioneuroma      Resection    Family History  Problem Relation Age of Onset  . Cancer Father   . Cancer Sister   . Heart disease Brother   . Bipolar disorder Sister     Social history:  reports that she has been smoking Cigarettes.  She has been smoking about 1.00 pack per day. She has never used smokeless tobacco. She reports that  drinks alcohol. Her drug history is not on file.  Allergies:  Allergies  Allergen Reactions  . Acthar Hp [Corticotropin] Other (See Comments)    Throat swelling and  coughing up blood  . Codeine   . Prednisone     Medications:  Current Outpatient Prescriptions on File Prior to Visit  Medication Sig Dispense Refill  . cetirizine (ZYRTEC) 10 MG chewable tablet Chew 10 mg by mouth daily.      Marland Kitchen topiramate (TOPAMAX) 200 MG tablet Take 200 mg by mouth 2 (two) times daily.      Marland Kitchen zolpidem (AMBIEN) 10 MG tablet TAKE 1 TABLET BY MOUTH AT NIGHT IF NEEDED  30 tablet  5   No current facility-administered medications on file prior to visit.    ROS:  Out of a complete 14 system review of symptoms, the patient complains only of the following symptoms, and all other reviewed systems are negative.  Weight loss  Joint pain Weakness, slurred speech Gait disorder Change in appetite  Blood pressure 150/93, pulse 74, weight 147 lb (66.679 kg).  Physical Exam  General: The patient is alert and cooperative at the time of the examination.  Skin: No significant peripheral edema is noted.   Neurologic Exam  Cranial nerves: Facial symmetry is present. Speech is normal, no aphasia or dysarthria is noted. Extraocular movements are full. Visual fields are full. pupils are equal, round, and reactive to light. Discs are flat bilaterally.   Motor: The patient has good strength in all 4 extremities.  Coordination: The patient has good finger-nose-finger and heel-to-shin  bilaterally.  Gait and station: The patient has a wide-based, unsteady gait. Tandem gait is poor. The patient walks with a walker. Romberg is negative. No drift is seen.  Reflexes: Deep tendon reflexes are symmetric.   Assessment/Plan:   1. Multiple sclerosis  2. Gait disorder  I have once again discussed with her the possibility of getting back on any disease modifying agent. The patient has been given information regarding Tecfidera and Gilenya. The patient will try to make a decision within the next several days, and signed the appropriate form and mail it back to our office. The patient  will have blood work done today. A prescription was sent for Mobic. The patient will followup in 5 or 6 months.   Marlan Palau MD 07/26/2013 12:42 PM  [facility] [address] [place], [place] [address]  Phone [phone] Fax [phone]

## 2014-01-26 ENCOUNTER — Telehealth: Payer: Self-pay | Admitting: Nurse Practitioner

## 2014-01-26 ENCOUNTER — Ambulatory Visit: Payer: BC Managed Care – PPO | Admitting: Nurse Practitioner

## 2014-01-26 NOTE — Telephone Encounter (Signed)
No show for scheduled appt 

## 2014-02-01 ENCOUNTER — Other Ambulatory Visit: Payer: Self-pay | Admitting: Neurology

## 2014-02-01 NOTE — Telephone Encounter (Signed)
Patient No Showed last appt.  I called her, got no answer.  Left message asking that she call our office to reschedule appt.

## 2014-02-03 ENCOUNTER — Other Ambulatory Visit: Payer: Self-pay

## 2014-02-03 MED ORDER — ZOLPIDEM TARTRATE 10 MG PO TABS: 10.0000 mg | ORAL_TABLET | Freq: Every evening | ORAL | Status: DC | PRN

## 2014-02-03 NOTE — Telephone Encounter (Signed)
Rx signed and faxed.

## 2018-02-15 ENCOUNTER — Ambulatory Visit (INDEPENDENT_AMBULATORY_CARE_PROVIDER_SITE_OTHER): Payer: Medicare Other | Admitting: Primary Care

## 2018-02-15 ENCOUNTER — Encounter: Payer: Self-pay | Admitting: Primary Care

## 2018-02-15 VITALS — BP 152/96 | HR 87 | Temp 98.2°F | Ht 63.0 in | Wt 145.0 lb

## 2018-02-15 DIAGNOSIS — Z30431 Encounter for routine checking of intrauterine contraceptive device: Secondary | ICD-10-CM | POA: Diagnosis not present

## 2018-02-15 DIAGNOSIS — M150 Primary generalized (osteo)arthritis: Secondary | ICD-10-CM

## 2018-02-15 DIAGNOSIS — K219 Gastro-esophageal reflux disease without esophagitis: Secondary | ICD-10-CM | POA: Insufficient documentation

## 2018-02-15 DIAGNOSIS — M159 Polyosteoarthritis, unspecified: Secondary | ICD-10-CM

## 2018-02-15 DIAGNOSIS — M1990 Unspecified osteoarthritis, unspecified site: Secondary | ICD-10-CM | POA: Insufficient documentation

## 2018-02-15 DIAGNOSIS — G43701 Chronic migraine without aura, not intractable, with status migrainosus: Secondary | ICD-10-CM

## 2018-02-15 DIAGNOSIS — I10 Essential (primary) hypertension: Secondary | ICD-10-CM | POA: Diagnosis not present

## 2018-02-15 DIAGNOSIS — G35 Multiple sclerosis: Secondary | ICD-10-CM

## 2018-02-15 MED ORDER — TOPIRAMATE 25 MG PO TABS: ORAL_TABLET | ORAL | 0 refills | Status: DC

## 2018-02-15 MED ORDER — RANITIDINE HCL 150 MG PO TABS: ORAL_TABLET | ORAL | 1 refills | Status: DC

## 2018-02-15 MED ORDER — SUMATRIPTAN SUCCINATE 25 MG PO TABS: ORAL_TABLET | ORAL | 0 refills | Status: DC

## 2018-02-15 NOTE — Progress Notes (Signed)
Subjective:    Patient ID: Anita [name], female    DOB: [birth year], 57 y.o.   MRN: [id number]  HPI  Ms. Attwood is a 57 year old female who presents today to establish care and discuss the problems mentioned below. Will obtain old records. Her last physical was years ago.   1) Multiple Sclerosis: Diagnosed in 2000. Previously following with Dr. Jannifer Franklin through [facility], no recent visit in years as she could no longer get to appointments. She's having difficulty with walking, speech, walking, overall strength that has progressed over the last 6+ months. She uses a walker to assist with ambulation and balance.    2) Migraines: Diagnosed years ago. She was once managed on Topamax in the past which helped with migraine prevention. She's not taken this in years as she lost her insurance at one point. She typically experienced migraines once every 1-2 months, but she's had daily migraines over the past 1 month. Her migraines are located to either side of her frontal, parietal, occipital lobes. She'll experience photophobia. She's currently taking Iburpofen (600 mg once daily) without improvement.   She has an IUD for which has been in place since 2009. She has not had a menstrual cycle in 3 years.   3) GERD: Chronic for years. She'll experience symptoms with certain foods and is aware of her triggers. She'll take Tums 5 times weekly on average depending on her diet. She's not tried Pepcid or Zantac.   4) Arthritis: Located to bilateral knees and hands for which she's been struggling with for years. She is mostly inactive during the day as she sits on her couch. She has a known "cyst" to the right knee that was discovered 10 years ago and was told that they'd have to "break the knee cap" to remove it.   Review of Systems  Eyes: Positive for photophobia.  Respiratory: Negative for shortness of breath.   Cardiovascular: Negative for chest pain.  Gastrointestinal:    GERD  Genitourinary:       IUD in place  Musculoskeletal: Positive for arthralgias.  Neurological: Positive for speech difficulty, weakness and headaches.       Multiple sclerosis       Past Medical History:  Diagnosis Date  . Abnormality of gait 07/26/2013  . Allergy   . Arthritis   . Chickenpox   . GERD (gastroesophageal reflux disease)   . Headache(784.0)    Migraine  . Hearing loss    Right ear secondary to infection  . History of shingles   . Migraines   . Multiple sclerosis ([place])   . Obese      Social History   Socioeconomic History  . Marital status: Divorced    Spouse name: Not on file  . Number of children: Not on file  . Years of education: Not on file  . Highest education level: Not on file  Social Needs  . Financial resource strain: Not on file  . Food insecurity - worry: Not on file  . Food insecurity - inability: Not on file  . Transportation needs - medical: Not on file  . Transportation needs - non-medical: Not on file  Occupational History  . Not on file  Tobacco Use  . Smoking status: Current Every Day Smoker    Packs/day: 1.00    Types: Cigarettes  . Smokeless tobacco: Never Used  Substance and Sexual Activity  . Alcohol use: Yes    Comment: Occasional  . Drug use:  Not on file  . Sexual activity: Not on file  Other Topics Concern  . Not on file  Social History Narrative  . Not on file    Past Surgical History:  Procedure Laterality Date  . Ganglioneuroma     Resection  . PILONIDAL CYST EXCISION    . PILONIDAL CYST EXCISION  1983  . TUMOR REMOVAL  2007/2008    Family History  Problem Relation Age of Onset  . Cancer Father   . Cancer Sister   . Heart disease Brother   . Bipolar disorder Sister     Allergies  Allergen Reactions  . Acthar Hp [Corticotropin] Other (See Comments)    Throat swelling and coughing up blood  . Codeine   . Prednisone     Current Outpatient Medications on File Prior to Visit  Medication Sig  Dispense Refill  . Doxylamine Succinate, Sleep, (SLEEP AID PO) Take by mouth as needed.    Marland Kitchen ibuprofen (ADVIL,MOTRIN) 200 MG tablet Take 200 mg by mouth every 6 (six) hours as needed.     No current facility-administered medications on file prior to visit.     BP (!) 152/96   Pulse 87   Temp 98.2 F (36.8 C) (Oral)   Ht 5\' 3"  (1.6 m)   Wt 145 lb (65.8 kg)   SpO2 99%   BMI 25.69 kg/m    Objective:   Physical Exam  Constitutional: She appears well-nourished.  Neck: Neck supple.  Cardiovascular: Normal rate and regular rhythm.  Pulmonary/Chest: Effort normal and breath sounds normal.  Skin: Skin is warm and dry.  Psychiatric: She has a normal mood and affect.          Assessment & Plan:  IUD In Place:  In place for the past 9-10 years. Could be the cause for increased migraines recently as the IUD is likely too old. Referral placed to GYN for further evaluation and removal.  [name], NP

## 2018-02-15 NOTE — Assessment & Plan Note (Signed)
Will start low dose Topamax nightly for daily migraines. Rx for sumatriptan sent for abortive treatment. She'll up date if no improvement.

## 2018-02-15 NOTE — Assessment & Plan Note (Signed)
Deteriorated, no recent neurology evaluation.  Referral placed today.

## 2018-02-15 NOTE — Patient Instructions (Signed)
Stop by the front desk and speak with either Rosaria Ferries  regarding your referral to Gynecology to have the IUD removed and to Neurology.  Start topirimate (Topamax) 25 mg tablets for migraine prevention. Take 1 tablet by mouth every evening for migraine prevention. Please call me if no improvement in 2 weeks.   You can take sumatriptan 25 mg tablets as needed for migraine. Take 1 tablet by mouth at migraine onset, may repeat in 2 hours if migraine persists. Do not exceed 2 tablets in 24 hours.  Start ranitidine 150 mg tablets once daily for heartburn symptoms. Try to avoid recurrent use of Tums. Please call me if no improvement in 2 weeks.  Please schedule a follow up appointment in 3 months.  It was a pleasure to meet you today! Please don't hesitate to call or message me with any questions. Welcome to [facility]!

## 2018-02-15 NOTE — Assessment & Plan Note (Signed)
No prior diagnosis, noted elevated readings in chart from 2014. Did not get to discuss this today given numerous other complaints. Will send phone note to have patient start monitoring, report readings at or above 140/90.  Will re-evaluate at next visit.

## 2018-02-15 NOTE — Assessment & Plan Note (Signed)
Likely secondary to inactivity and MS.  Recommend home PT, will get set up with neurology first.  Discussed importance of regular movement throughout the day to increase ROM and reduce stiffness.

## 2018-02-15 NOTE — Assessment & Plan Note (Signed)
Uncontrolled, recurrent use of Tums. Start Zantac 150 mg once to twice daily. Discussed to avoid triggers.

## 2018-03-09 ENCOUNTER — Encounter: Payer: Medicare Other | Admitting: Obstetrics and Gynecology

## 2018-03-19 ENCOUNTER — Encounter: Payer: Self-pay | Admitting: Obstetrics & Gynecology

## 2018-03-19 ENCOUNTER — Ambulatory Visit (INDEPENDENT_AMBULATORY_CARE_PROVIDER_SITE_OTHER): Payer: Medicare Other | Admitting: Obstetrics & Gynecology

## 2018-03-19 VITALS — BP 160/90 | Ht 63.5 in | Wt 141.0 lb

## 2018-03-19 DIAGNOSIS — Z1239 Encounter for other screening for malignant neoplasm of breast: Secondary | ICD-10-CM

## 2018-03-19 DIAGNOSIS — Z1231 Encounter for screening mammogram for malignant neoplasm of breast: Secondary | ICD-10-CM

## 2018-03-19 DIAGNOSIS — Z30432 Encounter for removal of intrauterine contraceptive device: Secondary | ICD-10-CM | POA: Diagnosis not present

## 2018-03-19 DIAGNOSIS — Z124 Encounter for screening for malignant neoplasm of cervix: Secondary | ICD-10-CM | POA: Diagnosis not present

## 2018-03-19 DIAGNOSIS — Z780 Asymptomatic menopausal state: Secondary | ICD-10-CM | POA: Diagnosis not present

## 2018-03-19 NOTE — Progress Notes (Signed)
HPI:      [name] is a 57 y.o.  who is postmenopausal, presents today for a problem visit.  She complains of hot flashes, insomnia, moodiness, no energy.   Symptoms have been present for a few years.  Stopped periods 3 years ago. Has MS and has been dealing with that for the last few years.  Has not had PAP in 12 years.  Had Mirena placed 12 years ago. Denies PostMenopausal Bleeding  PMHx: She  has a past medical history of Abnormality of gait (07/26/2013), Allergy, Arthritis, Chickenpox, GERD (gastroesophageal reflux disease), Headache(784.0), Hearing loss, History of shingles, Migraines, Multiple sclerosis (Anita [name]), Obese, and Optic neuritis. Also,  has a past surgical history that includes Pilonidal cyst excision; Ganglioneuroma; Tumor removal (2007/2008); and Pilonidal cyst excision (1983)., family history includes Bipolar disorder in her sister; Cancer in her father and sister; Heart disease in her brother.,  reports that she has been smoking cigarettes.  She has been smoking about 1.00 pack per day. She has never used smokeless tobacco. She reports that she drinks alcohol.  She has a current medication list which includes the following prescription(s): levonorgestrel, doxylamine succinate (sleep), ibuprofen, ranitidine, sumatriptan, and topiramate.  Also, is allergic to acthar hp [corticotropin]; codeine; and prednisone.  Review of Systems  Constitutional: Negative for chills, fever and malaise/fatigue.  HENT: Negative for congestion, sinus pain and sore throat.   Eyes: Negative for blurred vision and pain.  Respiratory: Negative for cough and wheezing.   Cardiovascular: Negative for chest pain and leg swelling.  Gastrointestinal: Negative for abdominal pain, constipation, diarrhea, heartburn, nausea and vomiting.  Genitourinary: Negative for dysuria, frequency, hematuria and urgency.  Musculoskeletal: Negative for back pain, joint pain, myalgias and neck pain.  Skin: Negative  for itching and rash.  Neurological: Negative for dizziness, tremors and weakness.  Endo/Heme/Allergies: Does not bruise/bleed easily.  Psychiatric/Behavioral: Negative for depression. The patient is not nervous/anxious and does not have insomnia.     Objective: BP (!) 160/90   Ht 5' 3.5" (1.613 m)   Wt 141 lb (64 kg)   BMI 24.59 kg/m  Physical Exam  Constitutional: She is oriented to person, place, and time. She appears well-developed and well-nourished. No distress.  Genitourinary: Rectum normal, vagina normal and uterus normal. Pelvic exam was performed with patient supine. There is no rash or lesion on the right labia. There is no rash or lesion on the left labia. Vagina exhibits no lesion. No bleeding in the vagina. Right adnexum does not display mass and does not display tenderness. Left adnexum does not display mass and does not display tenderness. Cervix does not exhibit motion tenderness, lesion, friability or polyp.   Uterus is mobile and midaxial. Uterus is not enlarged or exhibiting a mass.  HENT:  Head: Normocephalic and atraumatic. Head is without laceration.  Right Ear: Hearing normal.  Left Ear: Hearing normal.  Nose: No epistaxis.  No foreign bodies.  Mouth/Throat: Uvula is midline, oropharynx is clear and moist and mucous membranes are normal.  Eyes: Pupils are equal, round, and reactive to light.  Neck: Normal range of motion. Neck supple. No thyromegaly present.  Cardiovascular: Normal rate and regular rhythm. Exam reveals no gallop and no friction rub.  No murmur heard. Pulmonary/Chest: Effort normal and breath sounds normal. No respiratory distress. She has no wheezes. Right breast exhibits no mass, no skin change and no tenderness. Left breast exhibits no mass, no skin change and no tenderness.  Abdominal: Soft. Bowel sounds  are normal. She exhibits no distension. There is no tenderness. There is no rebound.  Musculoskeletal: Normal range of motion.  Neurological:  She is alert and oriented to person, place, and time. No cranial nerve deficit.  Skin: Skin is warm and dry.  Psychiatric: She has a normal mood and affect. Judgment normal.  Vitals reviewed.   Patient Active Problem List   Diagnosis Date Noted  . Gastroesophageal reflux disease 02/15/2018  . Chronic migraine without aura with status migrainosus, not intractable 02/15/2018  . Osteoarthritis 02/15/2018  . Essential hypertension 02/15/2018  . Disturbance of skin sensation 03/26/2012  . Multiple sclerosis (Anita [name]) 03/26/2012  . TOBACCO ABUSE 01/06/2011   Physical Exam:  BP (!) 160/90   Ht 5' 3.5" (1.613 m)   Wt 141 lb (64 kg)   BMI 24.59 kg/m  Body mass index is 24.59 kg/m. Constitutional: Well nourished, well developed female in no acute distress.  Chest clear B Heart Reg R/R, no m/g/r Extr 2+ pulses, no edema Skin- no lesions Abdomen: diffusely non tender to palpation, non distended, and no masses, hernias Neuro: Grossly intact Psych:  Normal mood and affect.   Pelvic exam:  Two IUD strings present seen coming from the cervical os. EGBUS, vaginal vault and cervix: within normal limits Uterus small, no mass Adnexa no mass, NT  IUD Removal Strings of IUD identified and grasped.  IUD removed without problem.  Pt tolerated this well.  IUD noted to be intact.  ASSESSMENT/PLAN:  Menopause. IUD removal. IUD removed, monitor for bleeding PAP MMG needed, encouraged Colonoscopy encouraged, declined Monitor for worsening sx's of menopause HTN- address w PCP  Barnett Applebaum, MD, [facility] 03/19/2018  3:49 PM

## 2018-03-19 NOTE — Patient Instructions (Signed)
PAP every three years Mammogram every year    Call [phone] to schedule at [place] Colonoscopy every 10 years Labs yearly (with PCP) 

## 2018-03-23 LAB — PAP IG (IMAGE GUIDED): PAP Smear Comment: 0

## 2018-05-19 ENCOUNTER — Ambulatory Visit: Payer: Medicare Other | Admitting: Primary Care

## 2018-06-23 ENCOUNTER — Telehealth: Payer: Self-pay

## 2018-06-23 NOTE — Telephone Encounter (Signed)
-----   Message from Gae Dry, MD sent at 06/23/2018 10:41 AM EDT ----- Regarding: MMG Received notice she has not received MMG yet as ordered at her last appt. Please check and encourage her to do this, and document conversation.

## 2018-06-23 NOTE — Telephone Encounter (Signed)
Called pt and left vm stating we have seen that she had not had her MMG done yet and we encourage her to get that done but to give [place] a call if she has any concerns or questions. I stated in the vm I would try again in a day or so

## 2020-01-23 DIAGNOSIS — L02213 Cutaneous abscess of chest wall: Secondary | ICD-10-CM | POA: Diagnosis not present

## 2020-03-20 ENCOUNTER — Emergency Department (HOSPITAL_COMMUNITY): Payer: Medicare Other

## 2020-03-20 ENCOUNTER — Inpatient Hospital Stay (HOSPITAL_COMMUNITY): Payer: Medicare Other

## 2020-03-20 ENCOUNTER — Inpatient Hospital Stay (HOSPITAL_COMMUNITY)
Admission: EM | Admit: 2020-03-20 | Discharge: 2020-03-28 | DRG: 286 | Disposition: A | Discharge status: Skilled Nursing Facility | Payer: Medicare Other | Attending: Internal Medicine | Admitting: Internal Medicine

## 2020-03-20 ENCOUNTER — Encounter (HOSPITAL_COMMUNITY): Payer: Self-pay | Admitting: Emergency Medicine

## 2020-03-20 DIAGNOSIS — G35 Multiple sclerosis: Secondary | ICD-10-CM | POA: Diagnosis not present

## 2020-03-20 DIAGNOSIS — E872 Acidosis: Secondary | ICD-10-CM | POA: Diagnosis not present

## 2020-03-20 DIAGNOSIS — I340 Nonrheumatic mitral (valve) insufficiency: Secondary | ICD-10-CM

## 2020-03-20 DIAGNOSIS — F5101 Primary insomnia: Secondary | ICD-10-CM | POA: Diagnosis not present

## 2020-03-20 DIAGNOSIS — R6510 Systemic inflammatory response syndrome (SIRS) of non-infectious origin without acute organ dysfunction: Secondary | ICD-10-CM

## 2020-03-20 DIAGNOSIS — I081 Rheumatic disorders of both mitral and tricuspid valves: Secondary | ICD-10-CM | POA: Diagnosis present

## 2020-03-20 DIAGNOSIS — I429 Cardiomyopathy, unspecified: Secondary | ICD-10-CM | POA: Diagnosis not present

## 2020-03-20 DIAGNOSIS — Z79899 Other long term (current) drug therapy: Secondary | ICD-10-CM

## 2020-03-20 DIAGNOSIS — R7989 Other specified abnormal findings of blood chemistry: Secondary | ICD-10-CM | POA: Diagnosis present

## 2020-03-20 DIAGNOSIS — I5023 Acute on chronic systolic (congestive) heart failure: Secondary | ICD-10-CM | POA: Diagnosis not present

## 2020-03-20 DIAGNOSIS — J9601 Acute respiratory failure with hypoxia: Secondary | ICD-10-CM

## 2020-03-20 DIAGNOSIS — Z809 Family history of malignant neoplasm, unspecified: Secondary | ICD-10-CM | POA: Diagnosis not present

## 2020-03-20 DIAGNOSIS — R9431 Abnormal electrocardiogram [ECG] [EKG]: Secondary | ICD-10-CM | POA: Diagnosis not present

## 2020-03-20 DIAGNOSIS — F17200 Nicotine dependence, unspecified, uncomplicated: Secondary | ICD-10-CM | POA: Diagnosis not present

## 2020-03-20 DIAGNOSIS — R1909 Other intra-abdominal and pelvic swelling, mass and lump: Secondary | ICD-10-CM | POA: Diagnosis present

## 2020-03-20 DIAGNOSIS — R000 Tachycardia, unspecified: Secondary | ICD-10-CM | POA: Diagnosis not present

## 2020-03-20 DIAGNOSIS — Z6827 Body mass index (BMI) 27.0-27.9, adult: Secondary | ICD-10-CM

## 2020-03-20 DIAGNOSIS — I2510 Atherosclerotic heart disease of native coronary artery without angina pectoris: Secondary | ICD-10-CM | POA: Diagnosis not present

## 2020-03-20 DIAGNOSIS — R042 Hemoptysis: Secondary | ICD-10-CM | POA: Diagnosis not present

## 2020-03-20 DIAGNOSIS — R778 Other specified abnormalities of plasma proteins: Secondary | ICD-10-CM | POA: Diagnosis present

## 2020-03-20 DIAGNOSIS — R1900 Intra-abdominal and pelvic swelling, mass and lump, unspecified site: Secondary | ICD-10-CM | POA: Diagnosis not present

## 2020-03-20 DIAGNOSIS — Z885 Allergy status to narcotic agent status: Secondary | ICD-10-CM | POA: Diagnosis not present

## 2020-03-20 DIAGNOSIS — R0603 Acute respiratory distress: Secondary | ICD-10-CM | POA: Diagnosis present

## 2020-03-20 DIAGNOSIS — Z7401 Bed confinement status: Secondary | ICD-10-CM | POA: Diagnosis not present

## 2020-03-20 DIAGNOSIS — Z818 Family history of other mental and behavioral disorders: Secondary | ICD-10-CM

## 2020-03-20 DIAGNOSIS — N179 Acute kidney failure, unspecified: Secondary | ICD-10-CM

## 2020-03-20 DIAGNOSIS — Z888 Allergy status to other drugs, medicaments and biological substances status: Secondary | ICD-10-CM

## 2020-03-20 DIAGNOSIS — I214 Non-ST elevation (NSTEMI) myocardial infarction: Secondary | ICD-10-CM

## 2020-03-20 DIAGNOSIS — I272 Pulmonary hypertension, unspecified: Secondary | ICD-10-CM | POA: Diagnosis present

## 2020-03-20 DIAGNOSIS — D649 Anemia, unspecified: Secondary | ICD-10-CM | POA: Diagnosis not present

## 2020-03-20 DIAGNOSIS — Z9981 Dependence on supplemental oxygen: Secondary | ICD-10-CM

## 2020-03-20 DIAGNOSIS — H469 Unspecified optic neuritis: Secondary | ICD-10-CM | POA: Diagnosis present

## 2020-03-20 DIAGNOSIS — G4700 Insomnia, unspecified: Secondary | ICD-10-CM | POA: Diagnosis present

## 2020-03-20 DIAGNOSIS — R2689 Other abnormalities of gait and mobility: Secondary | ICD-10-CM | POA: Diagnosis present

## 2020-03-20 DIAGNOSIS — Z720 Tobacco use: Secondary | ICD-10-CM | POA: Diagnosis not present

## 2020-03-20 DIAGNOSIS — I5021 Acute systolic (congestive) heart failure: Secondary | ICD-10-CM | POA: Diagnosis present

## 2020-03-20 DIAGNOSIS — M62838 Other muscle spasm: Secondary | ICD-10-CM | POA: Diagnosis present

## 2020-03-20 DIAGNOSIS — R197 Diarrhea, unspecified: Secondary | ICD-10-CM | POA: Diagnosis present

## 2020-03-20 DIAGNOSIS — K219 Gastro-esophageal reflux disease without esophagitis: Secondary | ICD-10-CM | POA: Diagnosis present

## 2020-03-20 DIAGNOSIS — J189 Pneumonia, unspecified organism: Secondary | ICD-10-CM | POA: Diagnosis present

## 2020-03-20 DIAGNOSIS — I361 Nonrheumatic tricuspid (valve) insufficiency: Secondary | ICD-10-CM | POA: Diagnosis not present

## 2020-03-20 DIAGNOSIS — G43909 Migraine, unspecified, not intractable, without status migrainosus: Secondary | ICD-10-CM | POA: Diagnosis present

## 2020-03-20 DIAGNOSIS — M1990 Unspecified osteoarthritis, unspecified site: Secondary | ICD-10-CM | POA: Diagnosis present

## 2020-03-20 DIAGNOSIS — F17210 Nicotine dependence, cigarettes, uncomplicated: Secondary | ICD-10-CM | POA: Diagnosis not present

## 2020-03-20 DIAGNOSIS — Z20822 Contact with and (suspected) exposure to covid-19: Secondary | ICD-10-CM | POA: Diagnosis not present

## 2020-03-20 DIAGNOSIS — D3610 Benign neoplasm of peripheral nerves and autonomic nervous system, unspecified: Secondary | ICD-10-CM | POA: Diagnosis present

## 2020-03-20 DIAGNOSIS — Z741 Need for assistance with personal care: Secondary | ICD-10-CM | POA: Diagnosis present

## 2020-03-20 DIAGNOSIS — I051 Rheumatic mitral insufficiency: Secondary | ICD-10-CM | POA: Diagnosis not present

## 2020-03-20 DIAGNOSIS — E669 Obesity, unspecified: Secondary | ICD-10-CM | POA: Diagnosis present

## 2020-03-20 DIAGNOSIS — R0602 Shortness of breath: Secondary | ICD-10-CM | POA: Diagnosis not present

## 2020-03-20 DIAGNOSIS — F419 Anxiety disorder, unspecified: Secondary | ICD-10-CM | POA: Diagnosis present

## 2020-03-20 DIAGNOSIS — I255 Ischemic cardiomyopathy: Secondary | ICD-10-CM | POA: Diagnosis not present

## 2020-03-20 DIAGNOSIS — D509 Iron deficiency anemia, unspecified: Secondary | ICD-10-CM | POA: Diagnosis present

## 2020-03-20 DIAGNOSIS — M6259 Muscle wasting and atrophy, not elsewhere classified, multiple sites: Secondary | ICD-10-CM | POA: Diagnosis present

## 2020-03-20 DIAGNOSIS — R5381 Other malaise: Secondary | ICD-10-CM | POA: Diagnosis not present

## 2020-03-20 DIAGNOSIS — H9191 Unspecified hearing loss, right ear: Secondary | ICD-10-CM | POA: Diagnosis not present

## 2020-03-20 DIAGNOSIS — M2550 Pain in unspecified joint: Secondary | ICD-10-CM | POA: Diagnosis not present

## 2020-03-20 DIAGNOSIS — R531 Weakness: Secondary | ICD-10-CM | POA: Diagnosis not present

## 2020-03-20 DIAGNOSIS — N189 Chronic kidney disease, unspecified: Secondary | ICD-10-CM | POA: Diagnosis present

## 2020-03-20 DIAGNOSIS — Z8249 Family history of ischemic heart disease and other diseases of the circulatory system: Secondary | ICD-10-CM

## 2020-03-20 DIAGNOSIS — M6281 Muscle weakness (generalized): Secondary | ICD-10-CM | POA: Diagnosis present

## 2020-03-20 HISTORY — DX: Acute respiratory distress: R06.03

## 2020-03-20 HISTORY — DX: Abnormal electrocardiogram (ECG) (EKG): R94.31

## 2020-03-20 HISTORY — DX: Systemic inflammatory response syndrome (sirs) of non-infectious origin without acute organ dysfunction: R65.10

## 2020-03-20 HISTORY — DX: Chronic kidney disease, unspecified: N17.9

## 2020-03-20 HISTORY — DX: Acute respiratory failure with hypoxia: J96.01

## 2020-03-20 LAB — BASIC METABOLIC PANEL
Anion gap: 18 — ABNORMAL HIGH (ref 5–15)
Anion gap: 13 (ref 5–15)
BUN: 21 mg/dL — ABNORMAL HIGH (ref 6–20)
BUN: 19 mg/dL (ref 6–20)
CO2: 16 mmol/L — ABNORMAL LOW (ref 22–32)
CO2: 16 mmol/L — ABNORMAL LOW (ref 22–32)
Calcium: 9.1 mg/dL (ref 8.9–10.3)
Calcium: 8.3 mg/dL — ABNORMAL LOW (ref 8.9–10.3)
Chloride: 102 mmol/L (ref 98–111)
Chloride: 105 mmol/L (ref 98–111)
Creatinine, Ser: 1.53 mg/dL — ABNORMAL HIGH (ref 0.44–1.00)
Creatinine, Ser: 1.48 mg/dL — ABNORMAL HIGH (ref 0.44–1.00)
GFR calc Af Amer: 43 mL/min — ABNORMAL LOW (ref >60)
GFR calc Af Amer: 45 mL/min — ABNORMAL LOW (ref >60)
GFR calc non Af Amer: 37 mL/min — ABNORMAL LOW (ref >60)
GFR calc non Af Amer: 39 mL/min — ABNORMAL LOW (ref >60)
Glucose, Bld: 185 mg/dL — ABNORMAL HIGH (ref 70–99)
Glucose, Bld: 115 mg/dL — ABNORMAL HIGH (ref 70–99)
Potassium: 4 mmol/L (ref 3.5–5.1)
Potassium: 3.2 mmol/L — ABNORMAL LOW (ref 3.5–5.1)
Sodium: 136 mmol/L (ref 135–145)
Sodium: 134 mmol/L — ABNORMAL LOW (ref 135–145)

## 2020-03-20 LAB — CBC
HCT: 33.3 % — ABNORMAL LOW (ref 36.0–46.0)
Hemoglobin: 10.3 g/dL — ABNORMAL LOW (ref 12.0–15.0)
MCH: 29.5 pg (ref 26.0–34.0)
MCHC: 30.9 g/dL (ref 30.0–36.0)
MCV: 95.4 fL (ref 80.0–100.0)
Platelets: 363 10*3/uL (ref 150–400)
RBC: 3.49 MIL/uL — ABNORMAL LOW (ref 3.87–5.11)
RDW: 14.9 % (ref 11.5–15.5)
WBC: 8.1 10*3/uL (ref 4.0–10.5)
nRBC: 0 % (ref 0.0–0.2)

## 2020-03-20 LAB — HIV ANTIBODY (ROUTINE TESTING W REFLEX): HIV Screen 4th Generation wRfx: NONREACTIVE

## 2020-03-20 LAB — RESPIRATORY PANEL BY RT PCR (FLU A&B, COVID)
Influenza A by PCR: NEGATIVE
Influenza B by PCR: NEGATIVE
SARS Coronavirus 2 by RT PCR: NEGATIVE

## 2020-03-20 LAB — URINALYSIS, ROUTINE W REFLEX MICROSCOPIC
Bilirubin Urine: NEGATIVE
Glucose, UA: NEGATIVE mg/dL
Ketones, ur: NEGATIVE mg/dL
Nitrite: NEGATIVE
Protein, ur: NEGATIVE mg/dL
Specific Gravity, Urine: 1.026 (ref 1.005–1.030)
pH: 5 (ref 5.0–8.0)

## 2020-03-20 LAB — RESPIRATORY PANEL BY PCR

## 2020-03-20 LAB — HEPATIC FUNCTION PANEL
ALT: 11 U/L (ref 0–44)
AST: 18 U/L (ref 15–41)
Albumin: 3.1 g/dL — ABNORMAL LOW (ref 3.5–5.0)
Alkaline Phosphatase: 56 U/L (ref 38–126)
Bilirubin, Direct: 0.1 mg/dL (ref 0.0–0.2)
Indirect Bilirubin: 0.6 mg/dL (ref 0.3–0.9)
Total Bilirubin: 0.7 mg/dL (ref 0.3–1.2)
Total Protein: 6.2 g/dL — ABNORMAL LOW (ref 6.5–8.1)

## 2020-03-20 LAB — LACTATE DEHYDROGENASE: LDH: 273 U/L — ABNORMAL HIGH (ref 98–192)

## 2020-03-20 LAB — CK: Total CK: 205 U/L (ref 38–234)

## 2020-03-20 LAB — MAGNESIUM: Magnesium: 1.8 mg/dL (ref 1.7–2.4)

## 2020-03-20 LAB — HEMOGLOBIN AND HEMATOCRIT, BLOOD
HCT: 28.9 % — ABNORMAL LOW (ref 36.0–46.0)
Hemoglobin: 9.2 g/dL — ABNORMAL LOW (ref 12.0–15.0)

## 2020-03-20 LAB — ECHOCARDIOGRAM COMPLETE
Height: 63 in
Weight: 2491.2 oz

## 2020-03-20 LAB — C-REACTIVE PROTEIN: CRP: 5.9 mg/dL — ABNORMAL HIGH (ref <1.0)

## 2020-03-20 LAB — SEDIMENTATION RATE: Sed Rate: 64 mm/hr — ABNORMAL HIGH (ref 0–22)

## 2020-03-20 LAB — FIBRINOGEN: Fibrinogen: 542 mg/dL — ABNORMAL HIGH (ref 210–475)

## 2020-03-20 LAB — TROPONIN I (HIGH SENSITIVITY)
Troponin I (High Sensitivity): 2581 ng/L (ref <18)
Troponin I (High Sensitivity): 2513 ng/L (ref <18)

## 2020-03-20 LAB — MRSA PCR SCREENING: MRSA by PCR: NEGATIVE

## 2020-03-20 LAB — STREP PNEUMONIAE URINARY ANTIGEN: Strep Pneumo Urinary Antigen: NEGATIVE

## 2020-03-20 LAB — PROCALCITONIN: Procalcitonin: 1.01 ng/mL

## 2020-03-20 LAB — BRAIN NATRIURETIC PEPTIDE: B Natriuretic Peptide: 1255.6 pg/mL — ABNORMAL HIGH (ref 0.0–100.0)

## 2020-03-20 LAB — FERRITIN: Ferritin: 123 ng/mL (ref 11–307)

## 2020-03-20 LAB — ACETAMINOPHEN LEVEL: Acetaminophen (Tylenol), Serum: <10 ug/mL — ABNORMAL LOW (ref 10–30)

## 2020-03-20 LAB — LACTIC ACID, PLASMA: Lactic Acid, Venous: 1 mmol/L (ref 0.5–1.9)

## 2020-03-20 LAB — TRIGLYCERIDES: Triglycerides: 80 mg/dL (ref <150)

## 2020-03-20 LAB — TSH: TSH: 0.963 u[IU]/mL (ref 0.350–4.500)

## 2020-03-20 LAB — SALICYLATE LEVEL: Salicylate Lvl: <7 mg/dL — ABNORMAL LOW (ref 7.0–30.0)

## 2020-03-20 MED ORDER — ONDANSETRON HCL 4 MG PO TABS: 4.0000 mg | ORAL_TABLET | Freq: Four times a day (QID) | ORAL | Status: DC | PRN

## 2020-03-20 MED ORDER — SODIUM CHLORIDE 0.9 % IV SOLN: INTRAVENOUS | Status: DC

## 2020-03-20 MED ORDER — SODIUM CHLORIDE 0.9 % IV SOLN
1.0000 g | INTRAVENOUS | Status: DC
  Administered 2020-03-21 – 2020-03-27 (×7): 1 g via INTRAVENOUS
  Filled 2020-03-20 (×7): qty 10

## 2020-03-20 MED ORDER — POTASSIUM CHLORIDE CRYS ER 20 MEQ PO TBCR
40.0000 meq | EXTENDED_RELEASE_TABLET | Freq: Once | ORAL | Status: AC
  Administered 2020-03-20: 40 meq via ORAL
  Filled 2020-03-20: qty 2

## 2020-03-20 MED ORDER — SODIUM CHLORIDE 0.9% FLUSH
3.0000 mL | Freq: Two times a day (BID) | INTRAVENOUS | Status: DC
  Administered 2020-03-20 – 2020-03-27 (×10): 3 mL via INTRAVENOUS

## 2020-03-20 MED ORDER — ALBUTEROL SULFATE (2.5 MG/3ML) 0.083% IN NEBU: 2.5000 mg | INHALATION_SOLUTION | Freq: Four times a day (QID) | RESPIRATORY_TRACT | Status: DC | PRN

## 2020-03-20 MED ORDER — MELATONIN 3 MG PO TABS
3.0000 mg | ORAL_TABLET | Freq: Every day | ORAL | Status: DC
  Administered 2020-03-20 – 2020-03-21 (×2): 3 mg via ORAL
  Filled 2020-03-20 (×2): qty 1

## 2020-03-20 MED ORDER — METHOCARBAMOL 500 MG PO TABS
500.0000 mg | ORAL_TABLET | Freq: Four times a day (QID) | ORAL | Status: DC | PRN
  Administered 2020-03-21 – 2020-03-27 (×4): 500 mg via ORAL
  Filled 2020-03-20 (×4): qty 1

## 2020-03-20 MED ORDER — ACETAMINOPHEN 650 MG RE SUPP
650.0000 mg | Freq: Four times a day (QID) | RECTAL | Status: DC | PRN
  Filled 2020-03-20: qty 1

## 2020-03-20 MED ORDER — DOXYLAMINE SUCCINATE (SLEEP) 25 MG PO TABS: 25.0000 mg | ORAL_TABLET | Freq: Every evening | ORAL | Status: DC | PRN

## 2020-03-20 MED ORDER — ACETAMINOPHEN 325 MG PO TABS
650.0000 mg | ORAL_TABLET | Freq: Four times a day (QID) | ORAL | Status: DC | PRN
  Administered 2020-03-20 – 2020-03-28 (×15): 650 mg via ORAL
  Filled 2020-03-20 (×16): qty 2

## 2020-03-20 MED ORDER — DOXYCYCLINE HYCLATE 100 MG PO TABS
100.0000 mg | ORAL_TABLET | Freq: Two times a day (BID) | ORAL | Status: DC
  Administered 2020-03-20 – 2020-03-27 (×15): 100 mg via ORAL
  Filled 2020-03-20 (×15): qty 1

## 2020-03-20 MED ORDER — MAGNESIUM SULFATE IN D5W 1-5 GM/100ML-% IV SOLN
1.0000 g | Freq: Once | INTRAVENOUS | Status: AC
  Administered 2020-03-20: 20:00:00 1 g via INTRAVENOUS
  Filled 2020-03-20: qty 100

## 2020-03-20 MED ORDER — SODIUM CHLORIDE 0.9 % IV SOLN
1.0000 g | Freq: Once | INTRAVENOUS | Status: AC
  Administered 2020-03-20: 1 g via INTRAVENOUS
  Filled 2020-03-20: qty 10

## 2020-03-20 MED ORDER — AZITHROMYCIN 250 MG PO TABS
500.0000 mg | ORAL_TABLET | Freq: Once | ORAL | Status: AC
  Administered 2020-03-20: 500 mg via ORAL
  Filled 2020-03-20: qty 2

## 2020-03-20 MED ORDER — SODIUM CHLORIDE 0.9 % IV SOLN: Freq: Once | INTRAVENOUS | Status: AC

## 2020-03-20 MED ORDER — HEPARIN (PORCINE) 25000 UT/250ML-% IV SOLN
1100.0000 [IU]/h | INTRAVENOUS | Status: DC
  Administered 2020-03-20: 05:00:00 1100 [IU]/h via INTRAVENOUS
  Filled 2020-03-20: qty 250

## 2020-03-20 MED ORDER — NICOTINE 14 MG/24HR TD PT24
14.0000 mg | MEDICATED_PATCH | Freq: Every day | TRANSDERMAL | Status: DC
  Administered 2020-03-20 – 2020-03-22 (×3): 14 mg via TRANSDERMAL
  Filled 2020-03-20 (×4): qty 1

## 2020-03-20 MED ORDER — SUMATRIPTAN SUCCINATE 25 MG PO TABS
25.0000 mg | ORAL_TABLET | ORAL | Status: DC | PRN
  Filled 2020-03-20: qty 1

## 2020-03-20 MED ORDER — SODIUM CHLORIDE 0.9% FLUSH
3.0000 mL | Freq: Once | INTRAVENOUS | Status: AC
  Administered 2020-03-20: 05:00:00 3 mL via INTRAVENOUS

## 2020-03-20 MED ORDER — IOHEXOL 350 MG/ML SOLN
60.0000 mL | Freq: Once | INTRAVENOUS | Status: AC | PRN
  Administered 2020-03-20: 05:00:00 60 mL via INTRAVENOUS

## 2020-03-20 MED ORDER — GUAIFENESIN ER 600 MG PO TB12: 600.0000 mg | ORAL_TABLET | Freq: Two times a day (BID) | ORAL | Status: DC

## 2020-03-20 MED ORDER — ONDANSETRON HCL 4 MG/2ML IJ SOLN: 4.0000 mg | Freq: Four times a day (QID) | INTRAMUSCULAR | Status: DC | PRN

## 2020-03-20 MED ORDER — HEPARIN BOLUS VIA INFUSION
4000.0000 [IU] | Freq: Once | INTRAVENOUS | Status: AC
  Administered 2020-03-20: 4000 [IU] via INTRAVENOUS
  Filled 2020-03-20: qty 4000

## 2020-03-20 MED ORDER — DIPHENHYDRAMINE HCL 25 MG PO CAPS
25.0000 mg | ORAL_CAPSULE | Freq: Every evening | ORAL | Status: DC | PRN
  Administered 2020-03-21 – 2020-03-27 (×7): 25 mg via ORAL
  Filled 2020-03-20 (×7): qty 1

## 2020-03-20 NOTE — ED Notes (Signed)
Patient taken to CT.

## 2020-03-20 NOTE — Progress Notes (Signed)
  Echocardiogram 2D Echocardiogram has been performed.  Anita [name] 03/20/2020, 8:54 AM

## 2020-03-20 NOTE — Progress Notes (Signed)
Patient is stable with her existing Tachycardia and Tachypnea. She was admitted with these same symptoms and patient states she feels normal. MD noted these symptoms in their progress notes as well. Patient is in no distress. Will continue to monitor closely.

## 2020-03-20 NOTE — ED Provider Notes (Signed)
[facility] EMERGENCY DEPARTMENT Provider Note   CSN: [id number] Arrival date & time: 03/20/20  0204     History Chief Complaint  Patient presents with  . Shortness of Breath    Anita [name] is a 59 y.o. female.  59 y/o female with hx of MS, GERD, migraines, obesity presents to the ED for c/o SOB.  She reports having fleeting episodes of shortness of breath over the course of the weekend with an associated congested sounding cough.  Tried Mucinex for this, but discontinued due to diarrheal side effects.  Her shortness of breath became more severe and constant at 1 AM this morning.  Reports slight aggravation of her SOB when lying back or supine.  Has had some difficulty with mobility and sitting up as a result of her MS.  She has felt lightheaded at times, but has not experienced any syncope or near syncope.  Denies hemoptysis, leg swelling, weight gain, chest pain, new or worsening back or abdominal pain, vomiting.  No sick contacts, recent surgeries or hospitalizations.  Patient, herself, without known HTN, HLD, DM, PE/VTE. No hx of ACS.  She is a 0.5ppd smoker. Brother survived "widow-maker" at [employer] y/o. Patient's mother with hx of HTN.   PCP: [place] @ [facility] with walker per outpatient notes in 2019.   Shortness of Breath      Past Medical History:  Diagnosis Date  . Abnormality of gait 07/26/2013  . Allergy   . Arthritis   . Chickenpox   . GERD (gastroesophageal reflux disease)   . Headache(784.0)    Migraine  . Hearing loss    Right ear secondary to infection  . History of shingles   . Migraines   . Multiple sclerosis ([place])   . Obese   . Optic neuritis     Patient Active Problem List   Diagnosis Date Noted  . Acute respiratory failure with hypoxia ([place]) 03/20/2020  . Gastroesophageal reflux disease 02/15/2018  . Chronic migraine without aura with status migrainosus, not intractable 02/15/2018  . Osteoarthritis 02/15/2018   . Essential hypertension 02/15/2018  . Disturbance of skin sensation 03/26/2012  . Multiple sclerosis ([place]) 03/26/2012  . TOBACCO ABUSE 01/06/2011    Past Surgical History:  Procedure Laterality Date  . Ganglioneuroma     Resection  . PILONIDAL CYST EXCISION    . PILONIDAL CYST EXCISION  1983  . TUMOR REMOVAL  2007/2008     OB History   No obstetric history on file.     Family History  Problem Relation Age of Onset  . Cancer Father   . Cancer Sister   . Heart disease Brother   . Bipolar disorder Sister     Social History   Tobacco Use  . Smoking status: Current Every Day Smoker    Packs/day: 1.00    Types: Cigarettes  . Smokeless tobacco: Never Used  Substance Use Topics  . Alcohol use: Yes    Comment: Occasional  . Drug use: Not on file    Home Medications Prior to Admission medications   Medication Sig Start Date End Date Taking? Authorizing Provider  Doxylamine Succinate, Sleep, (SLEEP AID PO) Take by mouth as needed.    [provider]  ibuprofen (ADVIL,MOTRIN) 200 MG tablet Take 200 mg by mouth every 6 (six) hours as needed.    [provider]  levonorgestrel (MIRENA) 20 MCG/24HR IUD 1 each by Intrauterine route once.    [provider]  ranitidine (  ZANTAC) 150 MG tablet Take 1 tablet by mouth once to twice daily for recurrent heartburn. Patient not taking: Reported on 03/20/2020 02/15/18   [name], NP  SUMAtriptan (IMITREX) 25 MG tablet Take 1 tablet by mouth at migraine onset, may repeat in 2 hours if headache persists or recurs. Do not exceed 2 tablets in 24 hours. 02/15/18   [name], NP  topiramate (TOPAMAX) 25 MG tablet Take 1 tablet by mouth every evening for migraine prevention. 02/15/18   [name], NP    Allergies    Acthar hp [corticotropin], Codeine, and Prednisone  Review of Systems   Review of Systems  Respiratory: Positive for shortness of breath.   Ten systems reviewed and are  negative for acute change, except as noted in the HPI.    Physical Exam Updated Vital Signs BP (!) 133/91   Pulse (!) 115   Temp 98.4 F (36.9 C) (Oral)   Resp (!) 23   Ht 5\' 3"  (1.6 m)   Wt 70.6 kg   SpO2 96%   BMI 27.58 kg/m   Physical Exam Vitals and nursing note reviewed.  Constitutional:      General: She is not in acute distress.    Appearance: She is well-developed. She is not diaphoretic.     Comments: Alert and pleasant.  HENT:     Head: Normocephalic and atraumatic.  Eyes:     General: No scleral icterus.    Conjunctiva/sclera: Conjunctivae normal.  Cardiovascular:     Rate and Rhythm: Regular rhythm. Tachycardia present.     Pulses: Normal pulses.     Comments: Tachycardia in the 120-130's Pulmonary:     Effort: Tachypnea present. No accessory muscle usage or respiratory distress.     Breath sounds: Examination of the right-lower field reveals decreased breath sounds. Examination of the left-lower field reveals decreased breath sounds. Decreased breath sounds present. No wheezing.     Comments: Patient with tachypnea and dyspnea. Speaks in truncated sentences. Decreased breath sounds b/l bases. No wheezes or rales noted.  Musculoskeletal:        General: Normal range of motion.     Cervical back: Normal range of motion.     Comments: No BLE edema  Skin:    General: Skin is warm and dry.     Coloration: Skin is not pale.     Findings: No erythema or rash.  Neurological:     Mental Status: She is alert and oriented to person, place, and time.     Coordination: Coordination normal.     Comments: GCS 15. Speech is clear, goal oriented. Able to transition in the bed without assistance.  Psychiatric:        Behavior: Behavior normal.     ED Results / Procedures / Treatments   Labs (all labs ordered are listed, but only abnormal results are displayed) Labs Reviewed  BASIC METABOLIC PANEL - Abnormal; Notable for the following components:      Result Value     CO2 16 (*)    Glucose, Bld 185 (*)    BUN 21 (*)    Creatinine, Ser 1.53 (*)    GFR calc non Af Amer 37 (*)    GFR calc Af Amer 43 (*)    Anion gap 18 (*)    All other components within normal limits  CBC - Abnormal; Notable for the following components:   RBC 3.49 (*)    Hemoglobin 10.3 (*)  HCT 33.3 (*)    All other components within normal limits  BRAIN NATRIURETIC PEPTIDE - Abnormal; Notable for the following components:   B Natriuretic Peptide 1,255.6 (*)    All other components within normal limits  TROPONIN I (HIGH SENSITIVITY) - Abnormal; Notable for the following components:   Troponin I (High Sensitivity) 2,581 (*)    All other components within normal limits  TROPONIN I (HIGH SENSITIVITY) - Abnormal; Notable for the following components:   Troponin I (High Sensitivity) 2,513 (*)    All other components within normal limits  CULTURE, BLOOD (ROUTINE X 2)  CULTURE, BLOOD (ROUTINE X 2)  RESPIRATORY PANEL BY RT PCR (FLU A&B, COVID)  HEPARIN LEVEL (UNFRACTIONATED)  LACTIC ACID, PLASMA  PROCALCITONIN  LACTATE DEHYDROGENASE  FERRITIN  TRIGLYCERIDES  FIBRINOGEN  C-REACTIVE PROTEIN    EKG EKG Interpretation  Date/Time:  Tuesday March 20 2020 02:06:24 EDT Ventricular Rate:  129 PR Interval:  82 QRS Duration: 88 QT Interval:  400 QTC Calculation: 586 R Axis:   41 Text Interpretation: ** Critical Test Result: Long QTc Sinus tachycardia with short PR Low voltage QRS Nonspecific ST and T wave abnormality Abnormal ECG Confirmed by Orpah Greek [phone]) on 03/20/2020 3:54:34 AM   Radiology DG Chest 2 View  Result Date: 03/20/2020 CLINICAL DATA:  Shortness of breath EXAM: CHEST - 2 VIEW COMPARISON:  01/03/2011 FINDINGS: No consolidation or effusion. Emphysematous disease. Mild diffuse increased interstitial opacity. Heart size within normal limits. No pneumothorax. Aortic atherosclerosis. IMPRESSION: Diffuse interstitial process. Consider atypical infection  versus interstitial edema. There is probable underlying emphysema. Electronically Signed   By: Donavan Foil M.D.   On: 03/20/2020 02:39   CT Angio Chest PE W and/or Wo Contrast  Result Date: 03/20/2020 CLINICAL DATA:  Fever and chills with shortness of breath that began last night. EXAM: CT ANGIOGRAPHY CHEST WITH CONTRAST TECHNIQUE: Multidetector CT imaging of the chest was performed using the standard protocol during bolus administration of intravenous contrast. Multiplanar CT image reconstructions and MIPs were obtained to evaluate the vascular anatomy. CONTRAST:  27mL OMNIPAQUE IOHEXOL 350 MG/ML SOLN COMPARISON:  03/19/2006 chest CT FINDINGS: Cardiovascular: Normal heart size. No pericardial effusion. Aortic atherosclerosis. No pulmonary artery filling defect. Mediastinum/Nodes: Mild prominence of hilar lymph nodes, likely reactive/congestive. Lungs/Pleura: Patchy ground-glass opacity in the upper more than lower lungs. Small right and trace left pleural effusion with interlobular septal thickening at the bases. Upper Abdomen: Left-sided retroperitoneal mass measuring 5.6 cm. Along the inferior aspect of the mass or surgical clips and the mass is smaller than on 2007 abdominal CT. Musculoskeletal: No acute finding Review of the MIP images confirms the above findings. IMPRESSION: 1. Ground-glass opacities with history favoring atypical/viral pneumonia. 2. Small pleural effusions and interlobular septal thickening not typically associated with viral pneumonia, findings could reflect atypical edema or failure superimposed on infection. 3. 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 There is residual or recurrent mass adjacent to the retroperitoneal clips, 5.6 cm. Was the patient is prior resection subtotal? Electronically Signed   By: Monte Fantasia M.D.   On: 03/20/2020 05:27    Procedures .Critical Care Performed by: Antonietta Breach, PA-C Authorized by: Antonietta Breach, PA-C   Critical  care provider statement:    Critical care time (minutes):  45   Critical care was necessary to treat or prevent imminent or life-threatening deterioration of the following conditions: NSTEMI.   Critical care was time spent personally by me on the following activities:  Discussions with  consultants, evaluation of patient's response to treatment, examination of patient, ordering and performing treatments and interventions, ordering and review of laboratory studies, ordering and review of radiographic studies, pulse oximetry, re-evaluation of patient's condition, obtaining history from patient or surrogate and review of old charts   (including critical care time)  Medications Ordered in ED Medications  cefTRIAXone (ROCEPHIN) 1 g in sodium chloride 0.9 % 100 mL IVPB (has no administration in time range)  azithromycin (ZITHROMAX) tablet 500 mg (has no administration in time range)  sodium chloride flush (NS) 0.9 % injection 3 mL (3 mLs Intravenous Given 03/20/20 0444)  0.9 %  sodium chloride infusion ( Intravenous New Bag/Given 03/20/20 0507)  heparin bolus via infusion 4,000 Units (4,000 Units Intravenous Bolus from Bag 03/20/20 0509)  iohexol (OMNIPAQUE) 350 MG/ML injection 60 mL (60 mLs Intravenous Contrast Given 03/20/20 0504)    ED Course  I have reviewed the triage vital signs and the nursing notes.  Pertinent labs & imaging results that were available during my care of the patient were reviewed by me and considered in my medical decision making (see chart for details).  Clinical Course as of Mar 21 615  Tue Mar 20, 2020  0530 CTA without evidence of PE. There are findings suggestive of atypical or viral PNA. This would fit with preceding, intermittent SOB and cough. Will give initial dose of Rocephin/Azithromycin in the ED. Admitting team can continue if felt clinically indicated. COVID pending.  Given degree of troponin elevation, there may be a component of viral myocarditis. Imaging does  not presently suggest large pericardial effusion. BNP is pending to assess for heart failure superimposed on presumed infection. No prior hx of CHF.   [KH]  0540 BNP elevated at 1255. Troponin is stable on recheck. Cardiology paged to discuss whether or not to continue with heparin infusion.   [KH]  [phone] Spoke with Dr. Kalman Shan of Cardiology. Feels that heparin can presently be discontinued, especially in light of stable delta. Suspects troponin elevation may be related to stress cardiomyopathy versus myocarditis rather than ACS. TRH paged for admit.   [KH]    Clinical Course User Index [KH] Antonietta Breach, PA-C   MDM Rules/Calculators/A&P                      59 year old female with hx of MS to be admitted for further management of respiratory infection.  Presumed atypical versus viral pneumonia based on chest CT.  There is no evidence of pulmonary embolus.  This was initially a clinical concern on arrival given degree of tachypnea and tachycardia as well as oxygen saturations dropping below 95%.  Clinical picture is presently most consistent with atypical/viral pneumonia resulting in myocarditis versus stress cardiomyopathy contributing to troponin elevation.  There is also suspected to be a degree of superimposed edema based on imaging results and elevated BNP.  The patient does not appear overtly fluid overloaded on exam.  She has no prior hx of CHF or ACS.  Denies any associated chest pain.  Initially given heparin due to clinical concern for PE.  This has since been discontinued following consultation with cardiology.  Patient ordered to receive Rocephin and azithromycin for coverage of bacterial pneumonia pending COVID results.  Will require admission for further management.  The patient is presently stable, in no distress, with no supplemental O2 requirement.    Case discussed with Dr. Hal Hope. AM hospitalist team to assess in the ED for admission.  Vitals:   03/20/20  [id number] 03/20/20 0430  03/20/20 0445 03/20/20 0506  BP: (!) 152/92 (!) 133/94 (!) 133/91   Pulse: (!) 127 (!) 118 (!) 115   Resp: (!) 30 (!) 25 (!) 23   Temp:      TempSrc:      SpO2: 98% 97% 96%   Weight:    70.6 kg  Height:    5\' 3"  (1.6 m)    Final Clinical Impression(s) / ED Diagnoses Final diagnoses:  Atypical pneumonia  SIRS (systemic inflammatory response syndrome) (HCC)  Elevated troponin  AKI (acute kidney injury) [facility])    Rx / DC Orders ED Discharge Orders    None       Antonietta Breach, PA-C 03/20/20 [id number]    Orpah Greek, MD 03/25/20 [phone]

## 2020-03-20 NOTE — H&P (Addendum)
History and Physical    Anita [name] [id number] DOB: March 04, 1961 DOA: 03/20/2020  Referring MD/NP/PA: Gean Birchwood, MD PCP: [name], NP  Patient coming from: Home  Chief Complaint: Shortness of breath  I have personally briefly reviewed patient's old medical records in [place]   HPI: Anita [name] is a 59 y.o. female with medical history significant of multiple sclerosis with optic neuritis, right ear hearing loss, GERD, arthritis, and allergies presents with complaints of shortness of breath.  Reports having intermittent episodes of shortness of breath lasting approximately 2 minutes before self resolving over the last 3-4 days.  During episode she would have palpitations, diaphoresis, and feel anxious.  Symptoms would gradually resolve on their own.  Over this weekend she also has reported having diarrhea 3-4 episodes per day, nausea, and mostly nonproductive cough.  Diarrhea was noted to be yellowish-orange in she did not notice any blood.  She had tried taking Mucinex, but thought that it may have been causing her to have diarrhea.  Denied having any recent sick contacts or knowledge of chest pain, abdominal pain, leg swelling, weight gain, dysuria, or vomiting,.  However, last night while trying to go to bed developed severe shortness of breath which did not go away. Laying seem to worsen symptoms.   Reports smoking half a pack cigarettes per day on average.  She has been off of treatment for MS for at least 10 years now because she felt that they did not help.  Ambulates with difficulty using a walker.  She has been muscle spasms and utilizes Tylenol to treat symptoms.  Patient notes family history significant for mother who had a heart attack at age 63.   ED Course: Upon admission into the emergency department patient was seen to be afebrile with pulse 113-153, respirations 23-30, blood pressures maintained, and O2 saturations 93-98% on room air.   Labs significant for WBC 8.1, hemoglobin 10.3, CO2 16, BUN 21, creatinine 1.3, glucose 195, anion gap 18, troponin 2581-> 2513, and BNP 1255.6.  Chest x-ray showed diffuse interstitial process concerning for atypical pneumonia versus edema.  Patient had been given heparin bolus of 4000 units, Rocephin azithromycin, and 1 L normal saline IV fluids.  Cardiology has been consulted due to the elevated troponin.  TRH called to admit.  Review of Systems  Constitutional: Positive for diaphoresis and malaise/fatigue.  HENT: Negative for ear discharge and nosebleeds.   Eyes: Negative for photophobia and pain.  Respiratory: Positive for cough and shortness of breath.   Cardiovascular: Positive for palpitations. Negative for chest pain and leg swelling.  Gastrointestinal: Positive for diarrhea and nausea. Negative for blood in stool and vomiting.  Genitourinary: Positive for frequency (Chronic). Negative for dysuria.  Musculoskeletal: Positive for joint pain and myalgias.  Skin: Negative for itching.  Neurological: Positive for weakness and headaches. Negative for loss of consciousness.  Psychiatric/Behavioral: Positive for substance abuse. The patient has insomnia.     Past Medical History:  Diagnosis Date  . Abnormality of gait 07/26/2013  . Allergy   . Arthritis   . Chickenpox   . GERD (gastroesophageal reflux disease)   . Headache(784.0)    Migraine  . Hearing loss    Right ear secondary to infection  . History of shingles   . Migraines   . Multiple sclerosis ([place])   . Obese   . Optic neuritis     Past Surgical History:  Procedure Laterality Date  . Ganglioneuroma  Resection  . PILONIDAL CYST EXCISION    . PILONIDAL CYST EXCISION  1983  . TUMOR REMOVAL  2007/2008     reports that she has been smoking cigarettes. She has been smoking about 1.00 pack per day. She has never used smokeless tobacco. She reports current alcohol use. No history on file for drug.  Allergies  Allergen  Reactions  . Acthar Hp [Corticotropin] Other (See Comments)    Throat swelling and coughing up blood  . Codeine   . Prednisone     Family History  Problem Relation Age of Onset  . Cancer Father   . Cancer Sister   . Heart disease Brother   . Bipolar disorder Sister     Prior to Admission medications   Medication Sig Start Date End Date Taking? Authorizing Provider  Doxylamine Succinate, Sleep, (SLEEP AID PO) Take by mouth as needed.    [provider]  ibuprofen (ADVIL,MOTRIN) 200 MG tablet Take 200 mg by mouth every 6 (six) hours as needed.    [provider]  levonorgestrel (MIRENA) 20 MCG/24HR IUD 1 each by Intrauterine route once.    [provider]  ranitidine (ZANTAC) 150 MG tablet Take 1 tablet by mouth once to twice daily for recurrent heartburn. Patient not taking: Reported on 03/20/2020 02/15/18   [name], NP  SUMAtriptan (IMITREX) 25 MG tablet Take 1 tablet by mouth at migraine onset, may repeat in 2 hours if headache persists or recurs. Do not exceed 2 tablets in 24 hours. 02/15/18   [name], NP  topiramate (TOPAMAX) 25 MG tablet Take 1 tablet by mouth every evening for migraine prevention. 02/15/18   [name], NP    Physical Exam:  Constitutional: Middle-aged female who appears NAD, calm, comfortable Vitals:   03/20/20 0530 03/20/20 0545 03/20/20 0600 03/20/20 0615  BP: 130/86 127/83 117/75 115/76  Pulse: (!) 117 (!) 113 (!) 116 (!) 113  Resp: (!) 25 (!) 25 (!) 23 (!) 24  Temp:      TempSrc:      SpO2: 96% 96% 96% 96%  Weight:      Height:       Eyes: PERRL, lids and conjunctivae normal ENMT: Mucous membranes are dry. Posterior pharynx clear of any exudate or lesions. Neck: normal, supple, no masses, no thyromegaly Respiratory: Tachypneic with some crackles noted in the mid to lower lung fields.  No significant wheezing appreciated. Cardiovascular: Tachycardic, no murmurs / rubs / gallops.  No significant  lower extremity edema. 2+ pedal pulses. No carotid bruits.  Abdomen: no tenderness, no masses palpated. No hepatosplenomegaly. Bowel sounds positive.  Musculoskeletal: no clubbing / cyanosis. No joint deformity upper and lower extremities. Good ROM, no contractures. Normal muscle tone.  Skin: no rashes, lesions, ulcers. No induration Neurologic: CN 2-12 grossly intact.  Able to move all extremities. Psychiatric: Normal judgment and insight. Alert and oriented x 3. Normal mood.     Labs on Admission: I have personally reviewed following labs and imaging studies  CBC: Recent Labs  Lab 03/20/20 0232  WBC 8.1  HGB 10.3*  HCT 33.3*  MCV 95.4  PLT [id number]   Basic Metabolic Panel: Recent Labs  Lab 03/20/20 0232  NA 136  K 4.0  CL 102  CO2 16*  GLUCOSE 185*  BUN 21*  CREATININE 1.53*  CALCIUM 9.1   GFR: Estimated Creatinine Clearance: 38.2 mL/min (A) (by C-G formula based on SCr of 1.53 mg/dL (H)). Liver Function Tests: No  results for input(s): AST, ALT, ALKPHOS, BILITOT, PROT, ALBUMIN in the last 168 hours. No results for input(s): LIPASE, AMYLASE in the last 168 hours. No results for input(s): AMMONIA in the last 168 hours. Coagulation Profile: No results for input(s): INR, PROTIME in the last 168 hours. Cardiac Enzymes: No results for input(s): CKTOTAL, CKMB, CKMBINDEX, TROPONINI in the last 168 hours. BNP (last 3 results) No results for input(s): PROBNP in the last 8760 hours. HbA1C: No results for input(s): HGBA1C in the last 72 hours. CBG: No results for input(s): GLUCAP in the last 168 hours. Lipid Profile: No results for input(s): CHOL, HDL, LDLCALC, TRIG, CHOLHDL, LDLDIRECT in the last 72 hours. Thyroid Function Tests: No results for input(s): TSH, T4TOTAL, FREET4, T3FREE, THYROIDAB in the last 72 hours. Anemia Panel: No results for input(s): VITAMINB12, FOLATE, FERRITIN, TIBC, IRON, RETICCTPCT in the last 72 hours. Urine analysis: No results found for:  COLORURINE, APPEARANCEUR, LABSPEC, [place], GLUCOSEU, HGBUR, BILIRUBINUR, KETONESUR, PROTEINUR, UROBILINOGEN, NITRITE, LEUKOCYTESUR Sepsis Labs: Recent Results (from the past 240 hour(s))  Respiratory Panel by RT PCR (Flu A&B, Covid) -     Status: None   Collection Time: 03/20/20  5:54 AM  Result Value Ref Range Status   SARS Coronavirus 2 by RT PCR NEGATIVE NEGATIVE Final    Comment: (NOTE) SARS-CoV-2 target nucleic acids are NOT DETECTED. The SARS-CoV-2 RNA is generally detectable in upper respiratoy specimens during the acute phase of infection. The lowest concentration of SARS-CoV-2 viral copies this assay can detect is 131 copies/mL. A negative result does not preclude SARS-Cov-2 infection and should not be used as the sole basis for treatment or other patient management decisions. A negative result may occur with  improper specimen collection/handling, submission of specimen other than nasopharyngeal swab, presence of viral mutation(s) within the areas targeted by this assay, and inadequate number of viral copies (<131 copies/mL). A negative result must be combined with clinical observations, patient history, and epidemiological information. The expected result is Negative. Fact Sheet for Patients:  PinkCheek.be Fact Sheet for Healthcare Providers:  GravelBags.it This test is not yet ap proved or cleared by the [place] FDA and  has been authorized for detection and/or diagnosis of SARS-CoV-2 by FDA under an Emergency Use Authorization (EUA). This EUA will remain  in effect (meaning this test can be used) for the duration of the COVID-19 declaration under Section 564(b)(1) of the Act, 21 U.S.C. section 360bbb-3(b)(1), unless the authorization is terminated or revoked sooner.    Influenza A by PCR NEGATIVE NEGATIVE Final   Influenza B by PCR NEGATIVE NEGATIVE Final    Comment: (NOTE) The Xpert Xpress  SARS-CoV-2/FLU/RSV assay is intended as an aid in  the diagnosis of influenza from Nasopharyngeal swab specimens and  should not be used as a sole basis for treatment. Nasal washings and  aspirates are unacceptable for Xpert Xpress SARS-CoV-2/FLU/RSV  testing. Fact Sheet for Patients: PinkCheek.be Fact Sheet for Healthcare Providers: GravelBags.it This test is not yet approved or cleared by the [place] FDA and  has been authorized for detection and/or diagnosis of SARS-CoV-2 by  FDA under an Emergency Use Authorization (EUA). This EUA will remain  in effect (meaning this test can be used) for the duration of the  Covid-19 declaration under Section 564(b)(1) of the Act, 21  U.S.C. section 360bbb-3(b)(1), unless the authorization is  terminated or revoked. Performed at [facility], [place] [address] [place], [place] [address]      Radiological Exams on Admission:  DG Chest 2 View  Result Date: 03/20/2020 CLINICAL DATA:  Shortness of breath EXAM: CHEST - 2 VIEW COMPARISON:  01/03/2011 FINDINGS: No consolidation or effusion. Emphysematous disease. Mild diffuse increased interstitial opacity. Heart size within normal limits. No pneumothorax. Aortic atherosclerosis. IMPRESSION: Diffuse interstitial process. Consider atypical infection versus interstitial edema. There is probable underlying emphysema. Electronically Signed   By: Donavan Foil M.D.   On: 03/20/2020 02:39   CT Angio Chest PE W and/or Wo Contrast  Result Date: 03/20/2020 CLINICAL DATA:  Fever and chills with shortness of breath that began last night. EXAM: CT ANGIOGRAPHY CHEST WITH CONTRAST TECHNIQUE: Multidetector CT imaging of the chest was performed using the standard protocol during bolus administration of intravenous contrast. Multiplanar CT image reconstructions and MIPs were obtained to evaluate the vascular anatomy. CONTRAST:  84mL OMNIPAQUE IOHEXOL 350  MG/ML SOLN COMPARISON:  03/19/2006 chest CT FINDINGS: Cardiovascular: Normal heart size. No pericardial effusion. Aortic atherosclerosis. No pulmonary artery filling defect. Mediastinum/Nodes: Mild prominence of hilar lymph nodes, likely reactive/congestive. Lungs/Pleura: Patchy ground-glass opacity in the upper more than lower lungs. Small right and trace left pleural effusion with interlobular septal thickening at the bases. Upper Abdomen: Left-sided retroperitoneal mass measuring 5.6 cm. Along the inferior aspect of the mass or surgical clips and the mass is smaller than on 2007 abdominal CT. Musculoskeletal: No acute finding Review of the MIP images confirms the above findings. IMPRESSION: 1. Ground-glass opacities with history favoring atypical/viral pneumonia. 2. Small pleural effusions and interlobular septal thickening not typically associated with viral pneumonia, findings could reflect atypical edema or failure superimposed on infection. 3. 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 There is residual or recurrent mass adjacent to the retroperitoneal clips, 5.6 cm. Was the patient is prior resection subtotal? Electronically Signed   By: Monte Fantasia M.D.   On: 03/20/2020 05:27    EKG: Independently reviewed. Sinus tachycardia at 129 bpm with QT prolonged at 586.  Assessment/Plan Acute respiratory distress: Patient presents with acute onset of shortness of breath.  Patient currently maintaining O2 saturations on room air.  COVID-19 and influenza screening were negative.  Based off imaging studies question pneumonia and/or edema. -Admit to a cardiac telemetry bed -Continuous pulse oximetry with nasal cannula oxygen as needed to maintain O2 saturations greater than 90% -Albuterol inhaler as needed  Elevated troponin: Acute.  High-sensitivity troponin elevated at 2581->2513.  She denies any complaints of chest pain.  Patient had initially been given heparin bolus, but was  discontinued after discussions with cardiology.  Question of secondary to demand. -Check echocardiogram  -Appreciate cardiology consultative services, will follow-up for any further recommendations.  Elevated BNP: Acute.  Initial BNP elevated at 1255.6.   Patient does not appear to be fluid overloaded. No significant lower extremity edema or JVD appreciated on physical exam, but did have some lower lung field crackles.  Imaging studies significant for possible edema and some small pleural effusion.   -Strict intake and output -Daily weight -Check TSH  -Will avoid diuretics at this time and monitor as heart rate and respiratory rates have improved since receiving IV fluids.  Possible pneumonia: She complains of cough and diarrhea.  Imaging studies concerning for possible pneumonia.  Influenza and COVID-19 screening both negative.  Initially given Rocephin and azithromycin.  -Follow-up procalcitonin -Add-on a respiratory virus panel  -Discontinue azithromycin due to prolonged QT interval -Continue Rocephin and add doxycycline  SIRS: Patient initially presented with tachycardia and tachypnea.  White blood cell count was within normal  limits.  Suspect respiratory cause for patient symptoms. -Follow-up blood cultures  Acute kidney injury: On admission creatinine elevated up to 1.53 with BUN 21.  No recent labs to compare.  Patient had been given 1 L normal saline IV fluids.  Suspect symptoms could be secondary to dehydration with reports of diarrhea. -Check urinalysis and CK -IV fluids at 75 ml/hr -Recheck kidney function in a.m.  Prolonged QT interval: Acute.  QTc initially 586. -Correct any electrolyte abnormalities -Avoid any QT prolonging medication -Recheck EKG in a.m.  Metabolic acidosis with elevated anion gap: On admission anion gap noted to be 18 with CO2 16.  Unclear cause of symptoms at this time. -  Multiple sclerosis: Patient reports that she has been off of any kind of  treatment over the last 10 years as she felt they were having no effect.  Patient reports that she has been using Tylenol and ibuprofen. -Check acetaminophen and salicylate level -Robaxin as needed for muscle spasms   Normocytic anemia: Acute.  Hemoglobin 10.3 g/dL on admission, but previously noted to be within normal limits. -Recheck H&H  Left retroperitoneal mass: Patient with prior history of ganglioneuroma back in 2007. -May warrant further investigation  Tobacco abuse: Patient smokes 0.5 pack cigarettes per day on average but has recently cut back. -Counseled on the need of cessation of tobacco use -Nicotine patch offered  DVT prophylaxis: SCDs Code Status: Full Family Communication: Family updated Disposition Plan: Likely discharge home in 2 to 3 days once medically stable Consults called: Cardiology Admission status: Inpatient  Norval Morton MD [facility] Pager [phone]   If 7PM-7AM, please contact night-coverage [email or web address] Password [facility]  03/20/2020, 7:12 AM

## 2020-03-20 NOTE — ED Triage Notes (Signed)
Pt in [place] with daughter. Daughter states pt woke up her C/O sudden onset SOB. Reports dry cough, subjective fevers, chills/body aches.

## 2020-03-20 NOTE — Plan of Care (Signed)

## 2020-03-20 NOTE — Progress Notes (Signed)
ANTICOAGULATION CONSULT NOTE - Initial Consult  Pharmacy Consult for Heparin Indication: R/PE/ACS  Allergies  Allergen Reactions  . Acthar Hp [Corticotropin] Other (See Comments)    Throat swelling and coughing up blood  . Codeine   . Prednisone     Patient Measurements:     Vital Signs: Temp: 98.4 F (36.9 C) (04/13 0209) Temp Source: Oral (04/13 0209) BP: 174/99 (04/13 0209) Pulse Rate: 152 (04/13 0209)  Labs: Recent Labs    03/20/20 0232  HGB 10.3*  HCT 33.3*  PLT 363  CREATININE 1.53*  TROPONINIHS 2,581*    CrCl cannot be calculated (Unknown ideal weight.).   Medical History: Past Medical History:  Diagnosis Date  . Abnormality of gait 07/26/2013  . Allergy   . Arthritis   . Chickenpox   . GERD (gastroesophageal reflux disease)   . Headache(784.0)    Migraine  . Hearing loss    Right ear secondary to infection  . History of shingles   . Migraines   . Multiple sclerosis ([place])   . Obese   . Optic neuritis     Medications:  No current facility-administered medications on file prior to encounter.   Current Outpatient Medications on File Prior to Encounter  Medication Sig Dispense Refill  . Doxylamine Succinate, Sleep, (SLEEP AID PO) Take by mouth as needed.    Marland Kitchen ibuprofen (ADVIL,MOTRIN) 200 MG tablet Take 200 mg by mouth every 6 (six) hours as needed.    Marland Kitchen levonorgestrel (MIRENA) 20 MCG/24HR IUD 1 each by Intrauterine route once.    . ranitidine (ZANTAC) 150 MG tablet Take 1 tablet by mouth once to twice daily for recurrent heartburn. 180 tablet 1  . SUMAtriptan (IMITREX) 25 MG tablet Take 1 tablet by mouth at migraine onset, may repeat in 2 hours if headache persists or recurs. Do not exceed 2 tablets in 24 hours. 10 tablet 0  . topiramate (TOPAMAX) 25 MG tablet Take 1 tablet by mouth every evening for migraine prevention. 90 tablet 0     Assessment: 59 y.o. female with SOB, possible PE vs ACS, for heparin  Goal of Therapy:  Heparin level  0.3-0.7 units/ml Monitor platelets by anticoagulation protocol: Yes   Plan:  Heparin 4000 units IV bolus, then start heparin 1100 units/hr Check heparin level in 8 hours.   [name] 03/20/2020,4:25 AM

## 2020-03-20 NOTE — Consult Note (Signed)
Cardiology Consultation:   Patient ID: Anita [name] MRN: [id number]; DOB: [birth year]  Admit date: 03/20/2020 Date of Consult: 03/20/2020  Primary Care Provider: [name], NP Primary Cardiologist:New   Patient Profile:   Anita [name] is a 59 y.o. female with a hx of multiple sclerosis, optic neuritis, right-sided hearing loss and ongoing tobacco smoking who is being seen today for the evaluation of shortness of breath/CHF at the request of Dr. Tamala Julian.  Remotely seen by Dr. Percival Spanish for atypical chest pain.  No work-up.  History of Present Illness:   Anita [name] reported few days history of intermittent shortness of breath, fever, cough, congestion and palpitation.  Same subjective fever.  No Covid exposure.  Over the weekend she had sudden onset shortness of breath with and without association of palpitation.  Last for few minutes with self resolution.  Also had diarrhea, cough and nausea.  Tried Mucinex without any improvement.  Yesterday had worsening symptoms with long duration and came to ER.  She was noted tachycardic in 130s.  Afebrile.  BUN serum creatinine 21/1.3.  Anion gap 18.  High-sensitivity troponin 2581>>2513.  BNP 1255.  Chest x-ray with pulmonary edema versus atypical pneumonia.  Elevated LDH, fibrinogen, sed rate and CRP.  CT Angie of the chest negative for pulmonary embolism with concern for pulmonary edema versus atypical pneumonia.  Respiratory panel negative for influenza and Covid.  Normal TSH.  Admitted for acute respiratory failure secondary to CHF versus atypical viral pneumonia.  Given 1 dose of azithromycin and ceftriaxone.  On heparin.  Pending blood culture  Echocardiogram abnormal showing mildly reduced LV function and rheumatic mitral valve as below.  1. Left ventricular ejection fraction, by estimation, is 40 to 45%. The  left ventricle has mildly decreased function. The left ventricle  demonstrates global hypokinesis. The left  ventricular internal cavity size  was mildly dilated. Left ventricular  diastolic function could not be evaluated.  2. Right ventricular systolic function is normal. The right ventricular  size is normal. There is moderately elevated pulmonary artery systolic  pressure.  3. Left atrial size was mildly dilated.  4. Moderate mitral subvalvular thickening/fibrosis.  5. The mitral valve is rheumatic. Moderate to severe mitral valve  regurgitation. Mild mitral stenosis. The mean mitral valve gradient is 7.3  mmHg with average heart rate of 105 bpm.  6. The aortic valve is normal in structure. Aortic valve regurgitation is  not visualized.  7. The inferior vena cava is dilated in size with <50% respiratory  variability, suggesting right atrial pressure of 15 mmHg.    Past Medical History:  Diagnosis Date   Abnormality of gait 07/26/2013   Allergy    Arthritis    Chickenpox    GERD (gastroesophageal reflux disease)    Headache(784.0)    Migraine   Hearing loss    Right ear secondary to infection   History of shingles    Migraines    Multiple sclerosis (HCC)    Obese    Optic neuritis     Past Surgical History:  Procedure Laterality Date   Ganglioneuroma     Resection   PILONIDAL CYST EXCISION     PILONIDAL CYST EXCISION  1983   TUMOR REMOVAL  2007/2008     Inpatient Medications: Scheduled Meds:  nicotine  14 mg Transdermal Daily   sodium chloride flush  3 mL Intravenous Q12H   Continuous Infusions:  sodium chloride 75 mL/hr at 03/20/20 0947   PRN Meds: acetaminophen **OR**  acetaminophen, albuterol, methocarbamol  Allergies:    Allergies  Allergen Reactions   Acthar Hp [Corticotropin] Other (See Comments)    Throat swelling and coughing up blood   Codeine    Prednisone     Social History:   Social History   Socioeconomic History   Marital status: Divorced    Spouse name: Not on file   Number of children: Not on file   Years  of education: Not on file   Highest education level: Not on file  Occupational History   Not on file  Tobacco Use   Smoking status: Current Every Day Smoker    Packs/day: 1.00    Types: Cigarettes   Smokeless tobacco: Never Used  Substance and Sexual Activity   Alcohol use: Yes    Comment: Occasional   Drug use: Not on file   Sexual activity: Not on file  Other Topics Concern   Not on file  Social History Narrative   Not on file   Social Determinants of Health   Financial Resource Strain:    Difficulty of Paying Living Expenses:   Food Insecurity:    Worried About Charity fundraiser in the Last Year:    Arboriculturist in the Last Year:   Transportation Needs:    Film/video editor (Medical):    Lack of Transportation (Non-Medical):   Physical Activity:    Days of Exercise per Week:    Minutes of Exercise per Session:   Stress:    Feeling of Stress :   Social Connections:    Frequency of Communication with Friends and Family:    Frequency of Social Gatherings with Friends and Family:    Attends Religious Services:    Active Member of Clubs or Organizations:    Attends Music therapist:    Marital Status:   Intimate Partner Violence:    Fear of Current or Ex-Partner:    Emotionally Abused:    Physically Abused:    Sexually Abused:     Family History:   Family History  Problem Relation Age of Onset   Cancer Father    Cancer Sister    Heart disease Brother    Bipolar disorder Sister      ROS:  Please see the history of present illness.  All other ROS reviewed and negative.     Physical Exam/Data:   Vitals:   03/20/20 0830 03/20/20 0900 03/20/20 0930 03/20/20 1104  BP: 124/86   120/81  Pulse: (!) 111 (!) 107 (!) 104 (!) 106  Resp: (!) 30 (!) 23 (!) 27 (!) 22  Temp:      TempSrc:      SpO2: 96% 96% 95% 97%  Weight:      Height:        Intake/Output Summary (Last 24 hours) at 03/20/2020 1215 Last data  filed at 03/20/2020 0730 Gross per 24 hour  Intake 100 ml  Output --  Net 100 ml   Last 3 Weights 03/20/2020 03/19/2018 02/15/2018  Weight (lbs) 155 lb 11.2 oz 141 lb 145 lb  Weight (kg) 70.625 kg 63.957 kg 65.772 kg     Body mass index is 27.58 kg/m.  General: Ill-appearing female in no acute distress HEENT: normal Lymph: no adenopathy Neck: no JVD Endocrine:  No thryomegaly Vascular: No carotid bruits; FA pulses 2+ bilaterally without bruits  Cardiac:  normal S1, S2, regular tachycardic, 3 out of 6 systolic murmur Lungs: Scattered rales Abd: soft, nontender,  no hepatomegaly  Ext: no edema Musculoskeletal:  No deformities, BUE and BLE strength normal and equal Skin: warm and dry  Neuro:  no focal abnormalities noted Psych:  Normal affect   EKG:  The EKG was personally reviewed and demonstrates: Sinus tachycardia, QTC 586 MS Telemetry:  Telemetry was personally reviewed and demonstrates: Sinus tachycardia at 100-130s  Relevant CV Studies:  As summarized above  Laboratory Data:  High Sensitivity Troponin:   Recent Labs  Lab 03/20/20 0232 03/20/20 0441  TROPONINIHS 2,581* 2,513*     Chemistry Recent Labs  Lab 03/20/20 0232  NA 136  K 4.0  CL 102  CO2 16*  GLUCOSE 185*  BUN 21*  CREATININE 1.53*  CALCIUM 9.1  GFRNONAA 37*  GFRAA 43*  ANIONGAP 18*    Recent Labs  Lab 03/20/20 0826  PROT 6.2*  ALBUMIN 3.1*  AST 18  ALT 11  ALKPHOS 56  BILITOT 0.7   Hematology Recent Labs  Lab 03/20/20 0232  WBC 8.1  RBC 3.49*  HGB 10.3*  HCT 33.3*  MCV 95.4  MCH 29.5  MCHC 30.9  RDW 14.9  PLT 363   BNP Recent Labs  Lab 03/20/20 0441  BNP 1,255.6*     Radiology/Studies:  DG Chest 2 View  Result Date: 03/20/2020 CLINICAL DATA:  Shortness of breath EXAM: CHEST - 2 VIEW COMPARISON:  01/03/2011 FINDINGS: No consolidation or effusion. Emphysematous disease. Mild diffuse increased interstitial opacity. Heart size within normal limits. No pneumothorax.  Aortic atherosclerosis. IMPRESSION: Diffuse interstitial process. Consider atypical infection versus interstitial edema. There is probable underlying emphysema. Electronically Signed   By: Donavan Foil M.D.   On: 03/20/2020 02:39   CT Angio Chest PE W and/or Wo Contrast  Result Date: 03/20/2020 CLINICAL DATA:  Fever and chills with shortness of breath that began last night. EXAM: CT ANGIOGRAPHY CHEST WITH CONTRAST TECHNIQUE: Multidetector CT imaging of the chest was performed using the standard protocol during bolus administration of intravenous contrast. Multiplanar CT image reconstructions and MIPs were obtained to evaluate the vascular anatomy. CONTRAST:  84mL OMNIPAQUE IOHEXOL 350 MG/ML SOLN COMPARISON:  03/19/2006 chest CT FINDINGS: Cardiovascular: Normal heart size. No pericardial effusion. Aortic atherosclerosis. No pulmonary artery filling defect. Mediastinum/Nodes: Mild prominence of hilar lymph nodes, likely reactive/congestive. Lungs/Pleura: Patchy ground-glass opacity in the upper more than lower lungs. Small right and trace left pleural effusion with interlobular septal thickening at the bases. Upper Abdomen: Left-sided retroperitoneal mass measuring 5.6 cm. Along the inferior aspect of the mass or surgical clips and the mass is smaller than on 2007 abdominal CT. Musculoskeletal: No acute finding Review of the MIP images confirms the above findings. IMPRESSION: 1. Ground-glass opacities with history favoring atypical/viral pneumonia. 2. Small pleural effusions and interlobular septal thickening not typically associated with viral pneumonia, findings could reflect atypical edema or failure superimposed on infection. 3. 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 There is residual or recurrent mass adjacent to the retroperitoneal clips, 5.6 cm. Was the patient is prior resection subtotal? Electronically Signed   By: Monte Fantasia M.D.   On: 03/20/2020 05:27   ECHOCARDIOGRAM  COMPLETE  Result Date: 03/20/2020    ECHOCARDIOGRAM REPORT   Patient Name:   Anita [name] Date of Exam: 03/20/2020 Medical Rec #:  [id number]             Height:       63.0 in Accession #:    [id number]  Weight:       155.7 lb Date of Birth:  [birth year]             BSA:          1.738 m Patient Age:    89 years              BP:           124/91 mmHg Patient Gender: F                     HR:           111 bpm. Exam Location:  Inpatient Procedure: 2D Echo Indications:    dyspnea 428.31  History:        Patient has no prior history of Echocardiogram examinations.                 Risk Factors:Current Smoker.  Sonographer:    Jannett Celestine RDCS (AE) Referring Phys: [phone] RONDELL A SMITH IMPRESSIONS  1. Left ventricular ejection fraction, by estimation, is 40 to 45%. The left ventricle has mildly decreased function. The left ventricle demonstrates global hypokinesis. The left ventricular internal cavity size was mildly dilated. Left ventricular diastolic function could not be evaluated.  2. Right ventricular systolic function is normal. The right ventricular size is normal. There is moderately elevated pulmonary artery systolic pressure.  3. Left atrial size was mildly dilated.  4. Moderate mitral subvalvular thickening/fibrosis.  5. The mitral valve is rheumatic. Moderate to severe mitral valve regurgitation. Mild mitral stenosis. The mean mitral valve gradient is 7.3 mmHg with average heart rate of 105 bpm.  6. The aortic valve is normal in structure. Aortic valve regurgitation is not visualized.  7. The inferior vena cava is dilated in size with <50% respiratory variability, suggesting right atrial pressure of 15 mmHg. Conclusion(s)/Recommendation(s): Consider TEE for better mitral regurgitation evaluation. FINDINGS  Left Ventricle: Left ventricular ejection fraction, by estimation, is 40 to 45%. The left ventricle has mildly decreased function. The left ventricle demonstrates global hypokinesis.  The left ventricular internal cavity size was mildly dilated. There is  no left ventricular hypertrophy. Left ventricular diastolic function could not be evaluated due to mitral stenosis. Left ventricular diastolic function could not be evaluated. Right Ventricle: The right ventricular size is normal. No increase in right ventricular wall thickness. Right ventricular systolic function is normal. There is moderately elevated pulmonary artery systolic pressure. The tricuspid regurgitant velocity is 3.30 m/s, and with an assumed right atrial pressure of 15 mmHg, the estimated right ventricular systolic pressure is 0000000 mmHg. Left Atrium: Left atrial size was mildly dilated. Right Atrium: Right atrial size was normal in size. Pericardium: There is no evidence of pericardial effusion. Mitral Valve: The mitral valve is rheumatic. There is moderate thickening of the mitral valve leaflet(s). Moderate mitral subvalvular thickening/fibrosis. Moderate to severe mitral valve regurgitation, with posteriorly-directed jet. Mild mitral valve stenosis. The mean mitral valve gradient is 7.3 mmHg with average heart rate of 105 bpm. Tricuspid Valve: The tricuspid valve is normal in structure. Tricuspid valve regurgitation is mild. Aortic Valve: The aortic valve is normal in structure. Aortic valve regurgitation is not visualized. Pulmonic Valve: The pulmonic valve was not well visualized. Pulmonic valve regurgitation is not visualized. Aorta: The aortic root and ascending aorta are structurally normal, with no evidence of dilitation. Venous: The inferior vena cava is dilated in size with less than 50% respiratory variability, suggesting right atrial pressure of 15 mmHg. IAS/Shunts: No  atrial level shunt detected by color flow Doppler.  LEFT VENTRICLE PLAX 2D LVIDd:         5.70 cm LVIDs:         4.60 cm LV PW:         0.90 cm LV IVS:        0.80 cm LVOT diam:     2.30 cm LV SV:         62 LV SV Index:   36 LVOT Area:     4.15 cm   RIGHT VENTRICLE TAPSE (M-mode): 1.3 cm LEFT ATRIUM             Index LA diam:        3.30 cm 1.90 cm/m LA Vol (A2C):   48.2 ml 27.73 ml/m LA Vol (A4C):   52.5 ml 30.20 ml/m LA Biplane Vol: 52.4 ml 30.14 ml/m  AORTIC VALVE LVOT Vmax:   102.31 cm/s LVOT Vmean:  65.204 cm/s LVOT VTI:    0.150 m  AORTA Ao Root diam: 3.30 cm MITRAL VALVE                 TRICUSPID VALVE MV Area (PHT): 2.42 cm      TR Peak grad:   43.6 mmHg MV Mean grad:  7.3 mmHg      TR Vmax:        330.00 cm/s MV Decel Time: 313 msec MR Peak grad:    76.7 mmHg   SHUNTS MR Mean grad:    49.0 mmHg   Systemic VTI:  0.15 m MR Vmax:         438.00 cm/s Systemic Diam: 2.30 cm MR Vmean:        328.0 cm/s MR PISA:         1.90 cm MR PISA Eff ROA: 17 mm MR PISA Radius:  0.55 cm MV E velocity: 145.00 cm/s MV A velocity: 120.00 cm/s MV E/A ratio:  1.21 Mihai Croitoru MD Electronically signed by Sanda Klein MD Signature Date/Time: 03/20/2020/10:00:41 AM    Final    {  Assessment and Plan:   1.  Acute respiratory distress secondary to pulmonary edema versus atypical viral pneumonia -No pulmonary embolism on CTA of the chest.  COVID-19 and influenza panel negative. -Elevated BNP with inflammatory marker  2.  Rheumatic heart disease/mitral regurgitation -Echocardiogram showed LV function at 40 to 45%.  Moderately elevated pulmonary artery systolic pressure.  Rheumatic mitral valve with moderate to severe mitral valve regurgitation and mild mitral valve stenosis.  Mean gradient is 7.3 mmHg.  3.  Sinus tachycardia -No arrhythmia on telemetry -Given rheumatic mitral valve watch for arrhythmia  4.  Chronic systolic heart failure -Echo with LV function of 40 to 45%.  Work-up with pulmonary edema versus atypical pneumonia.  No significant volume overload.  5.  Elevated troponin -No chest pain but some pleuritic component. -Given heparin bolus and is discontinued -Continue to observe for now -She certainly has risk factor for cardiac  disease  6.  Multiple sclerosis -Not on treatment for last 10 years -Per primary team  7.  AKI -Follow  8.  Prolonged QT -Avoid QT prolonging agent -Repeat EKG tomorrow  9.  Retroperitoneal mass  Seems patient has some sort of inflammatory process going on, continue work-up.  Dr. Harrell Gave to see.  We will continue to monitor her symptoms.  No ischemic evaluation today, may be cath after few days once clear understanding of etiology.  For questions or updates, please  contact [place] Please consult [email or web address] for contact info under     Jarrett Soho, PA  03/20/2020 12:15 PM

## 2020-03-20 NOTE — ED Notes (Signed)
Patient returned from CT

## 2020-03-20 NOTE — Plan of Care (Signed)
  Problem: Education: Goal: Knowledge of General Education information will improve Description Including pain rating scale, medication(s)/side effects and non-pharmacologic comfort measures Outcome: Progressing   

## 2020-03-20 NOTE — ED Triage Notes (Signed)
Labored breathing in triage, sats 93% RA.

## 2020-03-21 ENCOUNTER — Other Ambulatory Visit: Payer: Self-pay

## 2020-03-21 DIAGNOSIS — R0603 Acute respiratory distress: Secondary | ICD-10-CM | POA: Diagnosis not present

## 2020-03-21 DIAGNOSIS — I272 Pulmonary hypertension, unspecified: Secondary | ICD-10-CM

## 2020-03-21 DIAGNOSIS — I051 Rheumatic mitral insufficiency: Secondary | ICD-10-CM

## 2020-03-21 DIAGNOSIS — F5101 Primary insomnia: Secondary | ICD-10-CM

## 2020-03-21 DIAGNOSIS — R778 Other specified abnormalities of plasma proteins: Secondary | ICD-10-CM | POA: Diagnosis not present

## 2020-03-21 DIAGNOSIS — Z720 Tobacco use: Secondary | ICD-10-CM

## 2020-03-21 DIAGNOSIS — I5021 Acute systolic (congestive) heart failure: Secondary | ICD-10-CM

## 2020-03-21 DIAGNOSIS — R1900 Intra-abdominal and pelvic swelling, mass and lump, unspecified site: Secondary | ICD-10-CM

## 2020-03-21 DIAGNOSIS — J9601 Acute respiratory failure with hypoxia: Secondary | ICD-10-CM

## 2020-03-21 DIAGNOSIS — I429 Cardiomyopathy, unspecified: Secondary | ICD-10-CM | POA: Diagnosis not present

## 2020-03-21 LAB — MAGNESIUM: Magnesium: 2 mg/dL (ref 1.7–2.4)

## 2020-03-21 LAB — BASIC METABOLIC PANEL
Anion gap: 13 (ref 5–15)
BUN: 20 mg/dL (ref 6–20)
CO2: 19 mmol/L — ABNORMAL LOW (ref 22–32)
Calcium: 8.5 mg/dL — ABNORMAL LOW (ref 8.9–10.3)
Chloride: 106 mmol/L (ref 98–111)
Creatinine, Ser: 1.29 mg/dL — ABNORMAL HIGH (ref 0.44–1.00)
GFR calc Af Amer: 53 mL/min — ABNORMAL LOW (ref >60)
GFR calc non Af Amer: 46 mL/min — ABNORMAL LOW (ref >60)
Glucose, Bld: 107 mg/dL — ABNORMAL HIGH (ref 70–99)
Potassium: 3.8 mmol/L (ref 3.5–5.1)
Sodium: 138 mmol/L (ref 135–145)

## 2020-03-21 LAB — CBC
HCT: 28.6 % — ABNORMAL LOW (ref 36.0–46.0)
Hemoglobin: 9.2 g/dL — ABNORMAL LOW (ref 12.0–15.0)
MCH: 30.2 pg (ref 26.0–34.0)
MCHC: 32.2 g/dL (ref 30.0–36.0)
MCV: 93.8 fL (ref 80.0–100.0)
Platelets: 306 10*3/uL (ref 150–400)
RBC: 3.05 MIL/uL — ABNORMAL LOW (ref 3.87–5.11)
RDW: 15.3 % (ref 11.5–15.5)
WBC: 8.6 10*3/uL (ref 4.0–10.5)
nRBC: 0 % (ref 0.0–0.2)

## 2020-03-21 MED ORDER — LEVALBUTEROL HCL 1.25 MG/0.5ML IN NEBU
1.2500 mg | INHALATION_SOLUTION | Freq: Four times a day (QID) | RESPIRATORY_TRACT | Status: DC
  Administered 2020-03-21 (×2): 1.25 mg via RESPIRATORY_TRACT
  Filled 2020-03-21 (×2): qty 0.5

## 2020-03-21 NOTE — TOC Transition Note (Signed)
Transition of Care [facility]) - CM/SW Discharge Note   Patient Details  Name: Anita [name] MRN: [id number] Date of Birth: [birth year]  Transition of Care [facility]) CM/SW Contact:  Zenon Mayo, RN Phone Number: 03/21/2020, 4:06 PM   Clinical Narrative:    Patient from home, NCM asked if she wanted [facility] for CHF disease management, she states yes, NCM offered choice, she chose [facility].  Referral given to [facility] , he is able to take referral . Soc will begin in 24 to 48 hrs post dc. Will need HHRN orders with face to face.     Final next level of care: [place] Barriers to Discharge: Continued Medical Work up   Patient Goals and CMS Choice Patient states their goals for this hospitalization and ongoing recovery are:: get better CMS Medicare.gov Compare Post Acute Care list provided to:: Patient Choice offered to / list presented to : Patient  Discharge Placement                       Discharge Plan and Services                  DME Agency: NA       HH Arranged: RN, Disease Management [place] Agency: [place] Date [place]: 03/21/20 Time [place]: [id number] Representative spoke with at [place]: [place] ([place]) Interventions     Readmission Risk Interventions No flowsheet data found.

## 2020-03-21 NOTE — Progress Notes (Signed)
Progress Note  Patient Name: Anita [name] Date of Encounter: 03/21/2020  Primary Cardiologist: Buford Dresser, MD   Subjective   Remains tachycardic and mildly tachypneic. Last fever yesterday afternoon. Cultures negative thus far, respiratory panel negative. UA mildly abnormal but culture not positive at this time. White count remains normal. ESR 64, CRP 5.9. She reports that she has had nearly incessant diarrhea, no blood. Stomach is very upset. No chest pressure/pain. Shortness of breath similar to yesterday.  Inpatient Medications    Scheduled Meds: . doxycycline  100 mg Oral Q12H  . melatonin  3 mg Oral QHS  . nicotine  14 mg Transdermal Daily  . sodium chloride flush  3 mL Intravenous Q12H   Continuous Infusions: . cefTRIAXone (ROCEPHIN)  IV 1 g (03/21/20 0618)   PRN Meds: acetaminophen **OR** acetaminophen, albuterol, diphenhydrAMINE, methocarbamol, SUMAtriptan   Vital Signs    Vitals:   03/21/20 0000 03/21/20 0243 03/21/20 0400 03/21/20 0749  BP:  127/87  120/79  Pulse: (!) 115 (!) 120 (!) 119 (!) 120  Resp: (!) 25 (!) 24 (!) 28 20  Temp:  98.7 F (37.1 C)  99.6 F (37.6 C)  TempSrc:  Oral  Oral  SpO2: 98% 96% 95% 98%  Weight:  68.7 kg    Height:        Intake/Output Summary (Last 24 hours) at 03/21/2020 0911 Last data filed at 03/21/2020 0400 Gross per 24 hour  Intake 1085 ml  Output 1000 ml  Net 85 ml   Last 3 Weights 03/21/2020 03/20/2020 03/19/2018  Weight (lbs) 151 lb 7.3 oz 155 lb 11.2 oz 141 lb  Weight (kg) 68.7 kg 70.625 kg 63.957 kg      Telemetry    Sinus tachycardia - Personally Reviewed  ECG    Sinus tachycardia - Personally Reviewed  Physical Exam   GEN: No acute distress.   Neck: No JVD visible Cardiac: tachycardic but RRR, no gallops. Did appreciate a rub today, though in more of a pleuritic pattern (across chest) than pericardial. Softer 2/6 HSM today. Respiratory: diffusely coarse, with faint rales and mild  wheezing. GI: Soft, mildly tender, non-distended  MS: No edema; No deformity. Neuro:  Nonfocal  Psych: Normal affect   Labs    High Sensitivity Troponin:   Recent Labs  Lab 03/20/20 0232 03/20/20 0441  TROPONINIHS 2,581* 2,513*      Chemistry Recent Labs  Lab 03/20/20 0232 03/20/20 0826 03/20/20 1701 03/21/20 0645  NA 136  --  134* 138  K 4.0  --  3.2* 3.8  CL 102  --  105 106  CO2 16*  --  16* 19*  GLUCOSE 185*  --  115* 107*  BUN 21*  --  19 20  CREATININE 1.53*  --  1.48* 1.29*  CALCIUM 9.1  --  8.3* 8.5*  PROT  --  6.2*  --   --   ALBUMIN  --  3.1*  --   --   AST  --  18  --   --   ALT  --  11  --   --   ALKPHOS  --  56  --   --   BILITOT  --  0.7  --   --   GFRNONAA 37*  --  39* 46*  GFRAA 43*  --  45* 53*  ANIONGAP 18*  --  13 13     Hematology Recent Labs  Lab 03/20/20 0232 03/20/20 1701 03/21/20 0645  WBC 8.1  --  8.6  RBC 3.49*  --  3.05*  HGB 10.3* 9.2* 9.2*  HCT 33.3* 28.9* 28.6*  MCV 95.4  --  93.8  MCH 29.5  --  30.2  MCHC 30.9  --  32.2  RDW 14.9  --  15.3  PLT 363  --  306    BNP Recent Labs  Lab 03/20/20 0441  BNP 1,255.6*     DDimer No results for input(s): DDIMER in the last 168 hours.   Radiology    DG Chest 2 View  Result Date: 03/20/2020 CLINICAL DATA:  Shortness of breath EXAM: CHEST - 2 VIEW COMPARISON:  01/03/2011 FINDINGS: No consolidation or effusion. Emphysematous disease. Mild diffuse increased interstitial opacity. Heart size within normal limits. No pneumothorax. Aortic atherosclerosis. IMPRESSION: Diffuse interstitial process. Consider atypical infection versus interstitial edema. There is probable underlying emphysema. Electronically Signed   By: Donavan Foil M.D.   On: 03/20/2020 02:39   CT Angio Chest PE W and/or Wo Contrast  Result Date: 03/20/2020 CLINICAL DATA:  Fever and chills with shortness of breath that began last night. EXAM: CT ANGIOGRAPHY CHEST WITH CONTRAST TECHNIQUE: Multidetector CT imaging of  the chest was performed using the standard protocol during bolus administration of intravenous contrast. Multiplanar CT image reconstructions and MIPs were obtained to evaluate the vascular anatomy. CONTRAST:  71m OMNIPAQUE IOHEXOL 350 MG/ML SOLN COMPARISON:  03/19/2006 chest CT FINDINGS: Cardiovascular: Normal heart size. No pericardial effusion. Aortic atherosclerosis. No pulmonary artery filling defect. Mediastinum/Nodes: Mild prominence of hilar lymph nodes, likely reactive/congestive. Lungs/Pleura: Patchy ground-glass opacity in the upper more than lower lungs. Small right and trace left pleural effusion with interlobular septal thickening at the bases. Upper Abdomen: Left-sided retroperitoneal mass measuring 5.6 cm. Along the inferior aspect of the mass or surgical clips and the mass is smaller than on 2007 abdominal CT. Musculoskeletal: No acute finding Review of the MIP images confirms the above findings. IMPRESSION: 1. Ground-glass opacities with history favoring atypical/viral pneumonia. 2. Small pleural effusions and interlobular septal thickening not typically associated with viral pneumonia, findings could reflect atypical edema or failure superimposed on infection. 3. 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 There is residual or recurrent mass adjacent to the retroperitoneal clips, 5.6 cm. Was the patient is prior resection subtotal? Electronically Signed   By: JMonte FantasiaM.D.   On: 03/20/2020 05:27   ECHOCARDIOGRAM COMPLETE  Result Date: 03/20/2020    ECHOCARDIOGRAM REPORT   Patient Name:   Anita MASRIDate of Exam: 03/20/2020 Medical Rec #:  [id number]            Height:       63.0 in Accession #:    [id number]           Weight:       155.7 lb Date of Birth:  327-Jul-1962            BSA:          1.738 m Patient Age:    89years              BP:           124/91 mmHg Patient Gender: F                     HR:           111 bpm. Exam Location:  Inpatient  Procedure: 2D Echo Indications:    dyspnea 428.3[place]  History:        Patient has no prior history of Echocardiogram examinations.                 Risk Factors:Current Smoker.  Sonographer:    Jannett Celestine RDCS (AE) Referring Phys: [phone] RONDELL A SMITH IMPRESSIONS  1. Left ventricular ejection fraction, by estimation, is 40 to 45%. The left ventricle has mildly decreased function. The left ventricle demonstrates global hypokinesis. The left ventricular internal cavity size was mildly dilated. Left ventricular diastolic function could not be evaluated.  2. Right ventricular systolic function is normal. The right ventricular size is normal. There is moderately elevated pulmonary artery systolic pressure.  3. Left atrial size was mildly dilated.  4. Moderate mitral subvalvular thickening/fibrosis.  5. The mitral valve is rheumatic. Moderate to severe mitral valve regurgitation. Mild mitral stenosis. The mean mitral valve gradient is 7.3 mmHg with average heart rate of 105 bpm.  6. The aortic valve is normal in structure. Aortic valve regurgitation is not visualized.  7. The inferior vena cava is dilated in size with <50% respiratory variability, suggesting right atrial pressure of 15 mmHg. Conclusion(s)/Recommendation(s): Consider TEE for better mitral regurgitation evaluation. FINDINGS  Left Ventricle: Left ventricular ejection fraction, by estimation, is 40 to 45%. The left ventricle has mildly decreased function. The left ventricle demonstrates global hypokinesis. The left ventricular internal cavity size was mildly dilated. There is  no left ventricular hypertrophy. Left ventricular diastolic function could not be evaluated due to mitral stenosis. Left ventricular diastolic function could not be evaluated. Right Ventricle: The right ventricular size is normal. No increase in right ventricular wall thickness. Right ventricular systolic function is normal. There is moderately elevated pulmonary artery systolic  pressure. The tricuspid regurgitant velocity is 3.30 m/s, and with an assumed right atrial pressure of 15 mmHg, the estimated right ventricular systolic pressure is 23.5 mmHg. Left Atrium: Left atrial size was mildly dilated. Right Atrium: Right atrial size was normal in size. Pericardium: There is no evidence of pericardial effusion. Mitral Valve: The mitral valve is rheumatic. There is moderate thickening of the mitral valve leaflet(s). Moderate mitral subvalvular thickening/fibrosis. Moderate to severe mitral valve regurgitation, with posteriorly-directed jet. Mild mitral valve stenosis. The mean mitral valve gradient is 7.3 mmHg with average heart rate of 105 bpm. Tricuspid Valve: The tricuspid valve is normal in structure. Tricuspid valve regurgitation is mild. Aortic Valve: The aortic valve is normal in structure. Aortic valve regurgitation is not visualized. Pulmonic Valve: The pulmonic valve was not well visualized. Pulmonic valve regurgitation is not visualized. Aorta: The aortic root and ascending aorta are structurally normal, with no evidence of dilitation. Venous: The inferior vena cava is dilated in size with less than 50% respiratory variability, suggesting right atrial pressure of 15 mmHg. IAS/Shunts: No atrial level shunt detected by color flow Doppler.  LEFT VENTRICLE PLAX 2D LVIDd:         5.70 cm LVIDs:         4.60 cm LV PW:         0.90 cm LV IVS:        0.80 cm LVOT diam:     2.30 cm LV SV:         62 LV SV Index:   36 LVOT Area:     4.15 cm  RIGHT VENTRICLE TAPSE (M-mode): 1.3 cm LEFT ATRIUM             Index LA diam:  3.30 cm 1.90 cm/m LA Vol (A2C):   48.2 ml 27.73 ml/m LA Vol (A4C):   52.5 ml 30.20 ml/m LA Biplane Vol: 52.4 ml 30.14 ml/m  AORTIC VALVE LVOT Vmax:   102.31 cm/s LVOT Vmean:  65.204 cm/s LVOT VTI:    0.150 m  AORTA Ao Root diam: 3.30 cm MITRAL VALVE                 TRICUSPID VALVE MV Area (PHT): 2.42 cm      TR Peak grad:   43.6 mmHg MV Mean grad:  7.3 mmHg      TR  Vmax:        330.00 cm/s MV Decel Time: 313 msec MR Peak grad:    76.7 mmHg   SHUNTS MR Mean grad:    49.0 mmHg   Systemic VTI:  0.15 m MR Vmax:         438.00 cm/s Systemic Diam: 2.30 cm MR Vmean:        328.0 cm/s MR PISA:         1.90 cm MR PISA Eff ROA: 17 mm MR PISA Radius:  0.55 cm MV E velocity: 145.00 cm/s MV A velocity: 120.00 cm/s MV E/A ratio:  1.21 Mihai Croitoru MD Electronically signed by Sanda Klein MD Signature Date/Time: 03/20/2020/10:00:41 AM    Final     Cardiac Studies   Echo 03/20/20 1. Left ventricular ejection fraction, by estimation, is 40 to 45%. The  left ventricle has mildly decreased function. The left ventricle  demonstrates global hypokinesis. The left ventricular internal cavity size  was mildly dilated. Left ventricular  diastolic function could not be evaluated.  2. Right ventricular systolic function is normal. The right ventricular  size is normal. There is moderately elevated pulmonary artery systolic  pressure.  3. Left atrial size was mildly dilated.  4. Moderate mitral subvalvular thickening/fibrosis.  5. The mitral valve is rheumatic. Moderate to severe mitral valve  regurgitation. Mild mitral stenosis. The mean mitral valve gradient is 7.3  mmHg with average heart rate of 105 bpm.  6. The aortic valve is normal in structure. Aortic valve regurgitation is  not visualized.  7. The inferior vena cava is dilated in size with <50% respiratory  variability, suggesting right atrial pressure of 15 mmHg.  Patient Profile     59 y.o. female with PMH multiple sclerosis presented with acute respiratory distress and fevers. Echo with mildly reduced EF, elevated troponins, but no chest pain  Assessment & Plan    Respiratory distress, pulmonary infiltrates, fever, elevated inflammatory markers: -diarrhea as well, since 4/8 per her report -cultures NGTD -highly suspicious for inflammatory process -sinus tachycardia, tachypnea likely reactive to  this process -no PE on CT  Abnormal EF (40-45%), no prior. Unclear cardiomyopathy. Elevated troponins -denies any chest discomfort. No prior cardiac evaluation or history.  -would be reasonable to do [facility] this admission vs. Nuclear stress test, but would like her fever free at least 48 hours prior to this, and ideally would like more sense of whether/what infectious process is causing her symptoms. She would like to wait as well until more is understood about what is causing her fevers and diarrhea. Would also like to make sure her acute kidney injury continues to improve. -CT with patchy infiltrates, less common for heart failure and more common for infection -she is saturating well on room air. No LE edema or JVD. Has bilateral pleural effusions, but small, may be reactive. Would use lasix  PRN, does not appear to need today.  Moderate to severe mitral regurgitation with rheumatic appearing mitral valve, without severe mitral stenosis -thickened leaflets with rheumatic movement -MVA >3 based on pressure half time -no indication for intervention at this time.  For questions or updates, please contact [place] Please consult [email or web address] for contact info under     Signed, Buford Dresser, MD  03/21/2020, 9:11 AM

## 2020-03-21 NOTE — Plan of Care (Signed)

## 2020-03-21 NOTE — Progress Notes (Signed)
PROGRESS NOTE    Anita [name]  [id number] DOB: 02/11/1961 DOA: 03/20/2020 PCP: [name], NP   Brief Narrative:  Anita [name] is a 59 y.o. WF PMHx Multiple Sclerosis with Optic neuritis, RIGHT ear hearing loss, migraine headache, obese, GERD, arthritis, tobacco abuse, and allergies   Presents with complaints of shortness of breath.  Reports having intermittent episodes of shortness of breath lasting approximately 2 minutes before self resolving over the last 3-4 days.  During episode she would have palpitations, diaphoresis, and feel anxious.  Symptoms would gradually resolve on their own.  Over this weekend she also has reported having diarrhea 3-4 episodes per day, nausea, and mostly nonproductive cough.  Diarrhea was noted to be yellowish-orange in she did not notice any blood.  She had tried taking Mucinex, but thought that it may have been causing her to have diarrhea.  Denied having any recent sick contacts or knowledge of chest pain, abdominal pain, leg swelling, weight gain, dysuria, or vomiting,.  However, last night while trying to go to bed developed severe shortness of breath which did not go away. Laying seem to worsen symptoms.   Reports smoking half a pack cigarettes per day on average.  She has been off of treatment for MS for at least 10 years now because she felt that they did not help.  Ambulates with difficulty using a walker.  She has been muscle spasms and utilizes Tylenol to treat symptoms.  Patient notes family history significant for mother who had a heart attack at age 52.   ED Course: Upon admission into the emergency department patient was seen to be afebrile with pulse 113-153, respirations 23-30, blood pressures maintained, and O2 saturations 93-98% on room air.  Labs significant for WBC 8.1, hemoglobin 10.3, CO2 16, BUN 21, creatinine 1.3, glucose 195, anion gap 18, troponin 2581-> 2513, and BNP 1255.6.  Chest x-ray showed diffuse  interstitial process concerning for atypical pneumonia versus edema.  Patient had been given heparin bolus of 4000 units, Rocephin azithromycin, and 1 L normal saline IV fluids.  Cardiology has been consulted due to the elevated troponin.  TRH called to admit.   Subjective: Afebrile overnight, A/O x4, negative CP, positive S OB.  Not on home O2.  Smokes 1/2 PPD since she was 59 years old.  States watery/soft diarrhea since Thursday yellow/green in color.   Assessment & Plan:   Principal Problem:   Acute respiratory distress Active Problems:   TOBACCO ABUSE   Multiple sclerosis (HCC)   Acute respiratory failure with hypoxia (HCC)   Prolonged QT interval   NSTEMI (non-ST elevated myocardial infarction) (HCC)   SIRS (systemic inflammatory response syndrome) (HCC)   Normocytic anemia   Pneumonia   AKI (acute kidney injury) ([place])   Respiratory distress   Atypical pneumonia   Tobacco abuse   Elevated troponin   Acute systolic CHF (congestive heart failure) (HCC)   Pulmonary hypertension (HCC)   Retroperitoneal mass   Insomnia  Acute respiratory failure with hypoxia -Xopenex QID -Flutter valve -Incentive spirometer -Complete 7-day course antibiotics.  Azithromycin discontinued secondary to prolonged QT interval. -Would like to start Solu-Medrol 60 mg daily however patient lists steroids as an allergy.  Pharmacy will clarify. -Titrate O2 to maintain SPO2> 88%  Atypical pneumonia -Patient CT angiogram 4/13 consistent with viral/atypical pneumonia see results below -See acute respiratory distress -All viral panels negative see results below   SIRS:  -Initially presented tachycardia, tachypnea, WBC were WNL. -Most likely respiratory  cause. -Blood cultures NGTD   Tobacco abuse -Patient was counseled on need to absolutely discontinue smoking forever.  Had been smoking 1/2 PPD since 60 years old -Nicotine patch  Elevated troponin -Initially thought to be demand ischemia however  echocardiogram shows acute systolic CHF and acute pulmonary HTN. -4/14 cardiology notified concerning findings awaiting recommendations on if cardiac catheterization to occur. -Cardiology recommends [facility] this admission but would like her to be fever free at least 48 hours prior to procedure. -We will monitor patient's symptoms to ensure she meets cardiology parameters for cardiac catheterization.  Acute systolic CHF/NSTEMI -EF 40 to 45% see echocardiogram results below -Strict in and out -Daily weight -See elevated troponin  Acute pulmonary hypertension -See CHF/elevated troponin  Acute kidney injury vs CKD -Previous creatinine= 1.08 on 01/03/2011 -Most likely patient's renal function has been worsening over the years and she has not known this. Recent Labs  Lab 03/20/20 0232 03/20/20 1701 03/21/20 0645 03/22/20 0233  CREATININE 1.53* 1.48* 1.29* 1.21*  -Gently hydrate normal saline 61ml/hr  Prolonged QT interval: Acute.  QTc initially 586. -Correct any electrolyte abnormalities -Avoid any QT prolonging medication -Recheck EKG in a.m.    Left retroperitoneal mass -Patient with prior history of ganglioneuroma back in 2007.-May warrant further investigation  Metabolic acidosis with elevated anion gap -4/14 resolved  Multiple sclerosis:  -Patient reports that she has been off of any kind of treatment over the last 10 years as she felt they were having no effect.  Patient reports that she has been using Tylenol and ibuprofen. -Check acetaminophen and salicylate level -Robaxin as needed for muscle spasms   Normocytic anemia: Acute.  Hemoglobin 10.3 g/dL on admission, but previously noted to be within normal limits. -Recheck H&H -Transfuse for hemoglobin<7   Diarrhea  -C. difficile pending -Stool culture pending  Insomnia -Trazodone 50 mg QHS PRN     DVT prophylaxis: Subcu heparin Code Status:  Family Communication:  Disposition Plan:  1.  Where the  patient is from 2.  Anticipated d/c place. 3.  Barriers to d/c per cardiology   Consultants:  Cardiology    Procedures/Significant Events:  4/13 Echocardiogram;Left Ventricle: LVEF=40 -45%.-Global hypokinesis.  -Left ventricular diastolic function could not be evaluated due to mitral stenosis.  Right Ventricle:  moderately elevated pulmonary artery systolic pressure.  The tricuspid regurgitant velocity is 3.30 m/s, and with an assumed right atrial pressure of 15 mmHg, the estimated right ventricular systolic pressure is 0000000 mmHg.  Mitral Valve: Rheumatic. There is moderate thickening of the mitral valve leaflet(s). Moderate mitral subvalvular thickening/fibrosis. Moderate to severe mitral valve regurgitation, with  posteriorly-directed jet. Mild mitral valve stenosis. The mean mitral valve gradient is 7.3 mmHg with average heart  rate of 105 bpm.  Venous: The inferior vena cava is dilated in size with less than 50% respiratory variability, suggesting right atrial pressure of 15 mmHg.  4/14 CTA chest PE protocol;-ground-glass opacities with history favoring atypical/viral pneumonia. -Small pleural effusions and interlobular septal thickening not typically associated with viral pneumonia, findings could reflect atypical edema or failure superimposed on infection. -Left retroperitoneal mass, ganglioneuroma at 2007 pathology.There is residual or recurrent mass adjacent to the retroperitoneal clips, 5.6 cm. Was the patient is prior resection subtotal?    I have personally reviewed and interpreted all radiology studies and my findings are as above.  VENTILATOR SETTINGS:    Cultures 4/13 SARS coronavirus negative 4/13 influenza A/B negative 4/13 respiratory virus panel negative 4/13 HIV nonreactive    Antimicrobials: Anti-infectives (From  admission, onward)   Start     Stop   03/21/20 0630  cefTRIAXone (ROCEPHIN) 1 g in sodium chloride 0.9 % 100 mL IVPB         03/20/20 1430   doxycycline (VIBRA-TABS) tablet 100 mg         03/20/20 0545  cefTRIAXone (ROCEPHIN) 1 g in sodium chloride 0.9 % 100 mL IVPB     03/20/20 0730   03/20/20 0545  azithromycin (ZITHROMAX) tablet 500 mg     03/20/20 [id number]       Devices    LINES / TUBES:      Continuous Infusions: . sodium chloride Stopped (03/22/20 0543)  . sodium chloride    . cefTRIAXone (ROCEPHIN)  IV 200 mL/hr at 03/22/20 0600     Objective: Vitals:   03/22/20 0031 03/22/20 0308 03/22/20 0540 03/22/20 0715  BP: 122/80 122/86  (!) 136/94  Pulse: (!) 110 (!) 115  74  Resp: (!) 25 20  (!) 24  Temp: 98.3 F (36.8 C) 98.4 F (36.9 C)  99.1 F (37.3 C)  TempSrc: Oral Oral  Oral  SpO2: 94% 97%  98%  Weight:   67.2 kg   Height:        Intake/Output Summary (Last 24 hours) at 03/22/2020 0813 Last data filed at 03/22/2020 0600 Gross per 24 hour  Intake 644.83 ml  Output 350 ml  Net 294.83 ml   Filed Weights   03/20/20 0506 03/21/20 0243 03/22/20 0540  Weight: 70.6 kg 68.7 kg 67.2 kg    Examination:  General: A/O x4, positive acute respiratory distress Eyes: negative scleral hemorrhage, negative anisocoria, negative icterus ENT: Negative Runny nose, negative gingival bleeding, Neck:  Negative scars, masses, torticollis, lymphadenopathy, JVD Lungs: Tachypneic positive expiratory wheezes, negative crackles Cardiovascular: Tachycardic without murmur gallop or rub normal S1 and S2 Abdomen: negative abdominal pain, nondistended, positive soft, bowel sounds, no rebound, no ascites, no appreciable mass Extremities: No significant cyanosis, clubbing, or edema bilateral lower extremities Skin: Negative rashes, lesions, ulcers Psychiatric:  Negative depression, negative anxiety, negative fatigue, negative mania  Central nervous system:  Cranial nerves II through XII intact, tongue/uvula midline, all extremities muscle strength 5/5, sensation intact throughout, negative dysarthria, negative expressive  aphasia, negative receptive aphasia.  .     Data Reviewed: Care during the described time interval was provided by me .  I have reviewed this patient's available data, including medical history, events of note, physical examination, and all test results as part of my evaluation.   CBC: Recent Labs  Lab 03/20/20 0232 03/20/20 1701 03/21/20 0645 03/22/20 0233  WBC 8.1  --  8.6 8.9  HGB 10.3* 9.2* 9.2* 8.7*  HCT 33.3* 28.9* 28.6* 27.3*  MCV 95.4  --  93.8 93.8  PLT 363  --  306 [id number]   Basic Metabolic Panel: Recent Labs  Lab 03/20/20 0232 03/20/20 0743 03/20/20 1701 03/21/20 0645 03/22/20 0233  NA 136  --  134* 138 137  K 4.0  --  3.2* 3.8 3.9  CL 102  --  105 106 105  CO2 16*  --  16* 19* 19*  GLUCOSE 185*  --  115* 107* 106*  BUN 21*  --  19 20 18   CREATININE 1.53*  --  1.48* 1.29* 1.21*  CALCIUM 9.1  --  8.3* 8.5* 8.7*  MG  --  1.8  --  2.0 2.1  PHOS  --   --   --   --  3.6  GFR: Estimated Creatinine Clearance: 47.2 mL/min (A) (by C-G formula based on SCr of 1.21 mg/dL (H)). Liver Function Tests: Recent Labs  Lab 03/20/20 0826 03/22/20 0233  AST 18 30  ALT 11 17  ALKPHOS 56 55  BILITOT 0.7 0.7  PROT 6.2* 6.3*  ALBUMIN 3.1* 2.8*   No results for input(s): LIPASE, AMYLASE in the last 168 hours. No results for input(s): AMMONIA in the last 168 hours. Coagulation Profile: No results for input(s): INR, PROTIME in the last 168 hours. Cardiac Enzymes: Recent Labs  Lab 03/20/20 0810  CKTOTAL 205   BNP (last 3 results) No results for input(s): PROBNP in the last 8760 hours. HbA1C: No results for input(s): HGBA1C in the last 72 hours. CBG: No results for input(s): GLUCAP in the last 168 hours. Lipid Profile: Recent Labs    03/20/20 0743  TRIG 80   Thyroid Function Tests: Recent Labs    03/20/20 0743  TSH 0.963   Anemia Panel: Recent Labs    03/20/20 0743  FERRITIN 123   Urine analysis:    Component Value Date/Time   COLORURINE YELLOW  03/20/2020 1600   APPEARANCEUR CLEAR 03/20/2020 1600   LABSPEC 1.026 03/20/2020 1600   PHURINE 5.0 03/20/2020 1600   GLUCOSEU NEGATIVE 03/20/2020 1600   HGBUR MODERATE (A) 03/20/2020 1600   BILIRUBINUR NEGATIVE 03/20/2020 1600   KETONESUR NEGATIVE 03/20/2020 1600   PROTEINUR NEGATIVE 03/20/2020 1600   NITRITE NEGATIVE 03/20/2020 1600   LEUKOCYTESUR SMALL (A) 03/20/2020 1600   Sepsis Labs: @LABRCNTIP (procalcitonin:4,lacticidven:4)  ) Recent Results (from the past 240 hour(s))  Respiratory Panel by RT PCR (Flu A&B, Covid) -     Status: None   Collection Time: 03/20/20  5:54 AM  Result Value Ref Range Status   SARS Coronavirus 2 by RT PCR NEGATIVE NEGATIVE Final    Comment: (NOTE) SARS-CoV-2 target nucleic acids are NOT DETECTED. The SARS-CoV-2 RNA is generally detectable in upper respiratoy specimens during the acute phase of infection. The lowest concentration of SARS-CoV-2 viral copies this assay can detect is 131 copies/mL. A negative result does not preclude SARS-Cov-2 infection and should not be used as the sole basis for treatment or other patient management decisions. A negative result may occur with  improper specimen collection/handling, submission of specimen other than nasopharyngeal swab, presence of viral mutation(s) within the areas targeted by this assay, and inadequate number of viral copies (<131 copies/mL). A negative result must be combined with clinical observations, patient history, and epidemiological information. The expected result is Negative. Fact Sheet for Patients:  PinkCheek.be Fact Sheet for Healthcare Providers:  GravelBags.it This test is not yet ap proved or cleared by the [place] FDA and  has been authorized for detection and/or diagnosis of SARS-CoV-2 by FDA under an Emergency Use Authorization (EUA). This EUA will remain  in effect (meaning this test can be used) for the duration  of the COVID-19 declaration under Section 564(b)(1) of the Act, 21 U.S.C. section 360bbb-3(b)(1), unless the authorization is terminated or revoked sooner.    Influenza A by PCR NEGATIVE NEGATIVE Final   Influenza B by PCR NEGATIVE NEGATIVE Final    Comment: (NOTE) The Xpert Xpress SARS-CoV-2/FLU/RSV assay is intended as an aid in  the diagnosis of influenza from Nasopharyngeal swab specimens and  should not be used as a sole basis for treatment. Nasal washings and  aspirates are unacceptable for Xpert Xpress SARS-CoV-2/FLU/RSV  testing. Fact Sheet for Patients: PinkCheek.be Fact Sheet for Healthcare Providers: GravelBags.it This test  is not yet approved or cleared by the [place] and  has been authorized for detection and/or diagnosis of SARS-CoV-2 by  FDA under an Emergency Use Authorization (EUA). This EUA will remain  in effect (meaning this test can be used) for the duration of the  Covid-19 declaration under Section 564(b)(1) of the Act, 21  U.S.C. section 360bbb-3(b)(1), unless the authorization is  terminated or revoked. Performed at [facility], [place] [address], [place], [place] [address]   Respiratory Panel by PCR     Status: None   Collection Time: 03/20/20  5:54 AM   Specimen: Nasopharyngeal Swab; Respiratory  Result Value Ref Range Status   Adenovirus NOT DETECTED NOT DETECTED Final   Coronavirus 229E NOT DETECTED NOT DETECTED Final    Comment: (NOTE) The Coronavirus on the Respiratory Panel, DOES NOT test for the novel  Coronavirus (2019 nCoV)    Coronavirus HKU1 NOT DETECTED NOT DETECTED Final   Coronavirus NL63 NOT DETECTED NOT DETECTED Final   Coronavirus OC43 NOT DETECTED NOT DETECTED Final   Metapneumovirus NOT DETECTED NOT DETECTED Final   Rhinovirus / Enterovirus NOT DETECTED NOT DETECTED Final   Influenza A NOT DETECTED NOT DETECTED Final   Influenza B NOT DETECTED NOT DETECTED  Final   Parainfluenza Virus 1 NOT DETECTED NOT DETECTED Final   Parainfluenza Virus 2 NOT DETECTED NOT DETECTED Final   Parainfluenza Virus 3 NOT DETECTED NOT DETECTED Final   Parainfluenza Virus 4 NOT DETECTED NOT DETECTED Final   Respiratory Syncytial Virus NOT DETECTED NOT DETECTED Final   Bordetella pertussis NOT DETECTED NOT DETECTED Final   Chlamydophila pneumoniae NOT DETECTED NOT DETECTED Final   Mycoplasma pneumoniae NOT DETECTED NOT DETECTED Final    Comment: Performed at [facility], [place]. [address], [place], [place] [address]  Blood Culture (routine x 2)     Status: None (Preliminary result)   Collection Time: 03/20/20  7:31 AM   Specimen: BLOOD RIGHT FOREARM  Result Value Ref Range Status   Specimen Description BLOOD RIGHT FOREARM  Final   Special Requests   Final    BOTTLES DRAWN AEROBIC AND ANAEROBIC Blood Culture adequate volume Performed at [facility], [place] [address], [place], [place] [address]    Culture NO GROWTH 1 DAY  Final   Report Status PENDING  Incomplete  Blood Culture (routine x 2)     Status: None (Preliminary result)   Collection Time: 03/20/20  8:06 AM   Specimen: BLOOD LEFT ARM  Result Value Ref Range Status   Specimen Description BLOOD LEFT ARM  Final   Special Requests   Final    BOTTLES DRAWN AEROBIC AND ANAEROBIC Blood Culture adequate volume Performed at [facility], [place] [address] [place], [place] [address]    Culture NO GROWTH 1 DAY  Final   Report Status PENDING  Incomplete  MRSA PCR Screening     Status: None   Collection Time: 03/20/20  3:52 PM   Specimen: Nasopharyngeal  Result Value Ref Range Status   MRSA by PCR NEGATIVE NEGATIVE Final    Comment:        The GeneXpert MRSA Assay (FDA approved for NASAL specimens only), is one component of a comprehensive MRSA colonization surveillance program. It is not intended to diagnose MRSA infection nor to guide or monitor treatment for MRSA infections. Performed at  [facility], [place] [address] [place], [place] [address]  Radiology Studies: ECHOCARDIOGRAM COMPLETE  Result Date: 03/20/2020    ECHOCARDIOGRAM REPORT   Patient Name:   TANICE CONDOR Date of Exam: 03/20/2020 Medical Rec #:  [id number]             Height:       63.0 in Accession #:    [id number]            Weight:       155.7 lb Date of Birth:  1961-02-20             BSA:          1.738 m Patient Age:    72 years              BP:           124/91 mmHg Patient Gender: F                     HR:           111 bpm. Exam Location:  Inpatient Procedure: 2D Echo Indications:    dyspnea 428.31  History:        Patient has no prior history of Echocardiogram examinations.                 Risk Factors:Current Smoker.  Sonographer:    Jannett Celestine RDCS (AE) Referring Phys: [id number] RONDELL A SMITH IMPRESSIONS  1. Left ventricular ejection fraction, by estimation, is 40 to 45%. The left ventricle has mildly decreased function. The left ventricle demonstrates global hypokinesis. The left ventricular internal cavity size was mildly dilated. Left ventricular diastolic function could not be evaluated.  2. Right ventricular systolic function is normal. The right ventricular size is normal. There is moderately elevated pulmonary artery systolic pressure.  3. Left atrial size was mildly dilated.  4. Moderate mitral subvalvular thickening/fibrosis.  5. The mitral valve is rheumatic. Moderate to severe mitral valve regurgitation. Mild mitral stenosis. The mean mitral valve gradient is 7.3 mmHg with average heart rate of 105 bpm.  6. The aortic valve is normal in structure. Aortic valve regurgitation is not visualized.  7. The inferior vena cava is dilated in size with <50% respiratory variability, suggesting right atrial pressure of 15 mmHg. Conclusion(s)/Recommendation(s): Consider TEE for better mitral regurgitation evaluation. FINDINGS  Left Ventricle: Left ventricular ejection fraction, by estimation, is  40 to 45%. The left ventricle has mildly decreased function. The left ventricle demonstrates global hypokinesis. The left ventricular internal cavity size was mildly dilated. There is  no left ventricular hypertrophy. Left ventricular diastolic function could not be evaluated due to mitral stenosis. Left ventricular diastolic function could not be evaluated. Right Ventricle: The right ventricular size is normal. No increase in right ventricular wall thickness. Right ventricular systolic function is normal. There is moderately elevated pulmonary artery systolic pressure. The tricuspid regurgitant velocity is 3.30 m/s, and with an assumed right atrial pressure of 15 mmHg, the estimated right ventricular systolic pressure is 0000000 mmHg. Left Atrium: Left atrial size was mildly dilated. Right Atrium: Right atrial size was normal in size. Pericardium: There is no evidence of pericardial effusion. Mitral Valve: The mitral valve is rheumatic. There is moderate thickening of the mitral valve leaflet(s). Moderate mitral subvalvular thickening/fibrosis. Moderate to severe mitral valve regurgitation, with posteriorly-directed jet. Mild mitral valve stenosis. The mean mitral valve gradient is 7.3 mmHg with average heart rate of 105 bpm. Tricuspid Valve: The tricuspid valve is normal in structure. Tricuspid valve regurgitation is mild.  Aortic Valve: The aortic valve is normal in structure. Aortic valve regurgitation is not visualized. Pulmonic Valve: The pulmonic valve was not well visualized. Pulmonic valve regurgitation is not visualized. Aorta: The aortic root and ascending aorta are structurally normal, with no evidence of dilitation. Venous: The inferior vena cava is dilated in size with less than 50% respiratory variability, suggesting right atrial pressure of 15 mmHg. IAS/Shunts: No atrial level shunt detected by color flow Doppler.  LEFT VENTRICLE PLAX 2D LVIDd:         5.70 cm LVIDs:         4.60 cm LV PW:         0.90  cm LV IVS:        0.80 cm LVOT diam:     2.30 cm LV SV:         62 LV SV Index:   36 LVOT Area:     4.15 cm  RIGHT VENTRICLE TAPSE (M-mode): 1.3 cm LEFT ATRIUM             Index LA diam:        3.30 cm 1.90 cm/m LA Vol (A2C):   48.2 ml 27.73 ml/m LA Vol (A4C):   52.5 ml 30.20 ml/m LA Biplane Vol: 52.4 ml 30.14 ml/m  AORTIC VALVE LVOT Vmax:   102.31 cm/s LVOT Vmean:  65.204 cm/s LVOT VTI:    0.150 m  AORTA Ao Root diam: 3.30 cm MITRAL VALVE                 TRICUSPID VALVE MV Area (PHT): 2.42 cm      TR Peak grad:   43.6 mmHg MV Mean grad:  7.3 mmHg      TR Vmax:        330.00 cm/s MV Decel Time: 313 msec MR Peak grad:    76.7 mmHg   SHUNTS MR Mean grad:    49.0 mmHg   Systemic VTI:  0.15 m MR Vmax:         438.00 cm/s Systemic Diam: 2.30 cm MR Vmean:        328.0 cm/s MR PISA:         1.90 cm MR PISA Eff ROA: 17 mm MR PISA Radius:  0.55 cm MV E velocity: 145.00 cm/s MV A velocity: 120.00 cm/s MV E/A ratio:  1.21 Mihai Croitoru MD Electronically signed by Sanda Klein MD Signature Date/Time: 03/20/2020/10:00:41 AM    Final         Scheduled Meds: . doxycycline  100 mg Oral Q12H  . levalbuterol  1.25 mg Nebulization BID  . nicotine  21 mg Transdermal Daily  . sodium chloride flush  3 mL Intravenous Q12H   Continuous Infusions: . sodium chloride Stopped (03/22/20 0543)  . sodium chloride    . cefTRIAXone (ROCEPHIN)  IV 200 mL/hr at 03/22/20 0600     LOS: 2 days   The patient is critically ill with multiple organ systems failure and requires high complexity decision making for assessment and support, frequent evaluation and titration of therapies, application of advanced monitoring technologies and extensive interpretation of multiple databases. Critical Care Time devoted to patient care services described in this note  Time spent: 40 minutes     Ambrose Wile, Geraldo Docker, MD [facility] Pager [phone]  If 7PM-7AM, please contact night-coverage [email or web address] Password  [facility] 03/22/2020, 8:13 AM

## 2020-03-22 DIAGNOSIS — I5023 Acute on chronic systolic (congestive) heart failure: Secondary | ICD-10-CM

## 2020-03-22 DIAGNOSIS — G4700 Insomnia, unspecified: Secondary | ICD-10-CM | POA: Diagnosis present

## 2020-03-22 DIAGNOSIS — R7989 Other specified abnormal findings of blood chemistry: Secondary | ICD-10-CM

## 2020-03-22 DIAGNOSIS — J189 Pneumonia, unspecified organism: Secondary | ICD-10-CM

## 2020-03-22 DIAGNOSIS — R778 Other specified abnormalities of plasma proteins: Secondary | ICD-10-CM | POA: Diagnosis present

## 2020-03-22 DIAGNOSIS — Z720 Tobacco use: Secondary | ICD-10-CM | POA: Diagnosis present

## 2020-03-22 DIAGNOSIS — R1900 Intra-abdominal and pelvic swelling, mass and lump, unspecified site: Secondary | ICD-10-CM | POA: Diagnosis present

## 2020-03-22 DIAGNOSIS — I272 Pulmonary hypertension, unspecified: Secondary | ICD-10-CM | POA: Diagnosis present

## 2020-03-22 DIAGNOSIS — I5021 Acute systolic (congestive) heart failure: Secondary | ICD-10-CM | POA: Diagnosis present

## 2020-03-22 DIAGNOSIS — I429 Cardiomyopathy, unspecified: Secondary | ICD-10-CM | POA: Diagnosis not present

## 2020-03-22 HISTORY — DX: Acute on chronic systolic (congestive) heart failure: I50.23

## 2020-03-22 HISTORY — DX: Pneumonia, unspecified organism: J18.9

## 2020-03-22 HISTORY — DX: Other specified abnormal findings of blood chemistry: R79.89

## 2020-03-22 HISTORY — DX: Other specified abnormalities of plasma proteins: R77.8

## 2020-03-22 LAB — CBC
HCT: 27.3 % — ABNORMAL LOW (ref 36.0–46.0)
Hemoglobin: 8.7 g/dL — ABNORMAL LOW (ref 12.0–15.0)
MCH: 29.9 pg (ref 26.0–34.0)
MCHC: 31.9 g/dL (ref 30.0–36.0)
MCV: 93.8 fL (ref 80.0–100.0)
Platelets: 294 10*3/uL (ref 150–400)
RBC: 2.91 MIL/uL — ABNORMAL LOW (ref 3.87–5.11)
RDW: 15.3 % (ref 11.5–15.5)
WBC: 8.9 10*3/uL (ref 4.0–10.5)
nRBC: 0 % (ref 0.0–0.2)

## 2020-03-22 LAB — MAGNESIUM: Magnesium: 2.1 mg/dL (ref 1.7–2.4)

## 2020-03-22 LAB — COMPREHENSIVE METABOLIC PANEL
ALT: 17 U/L (ref 0–44)
AST: 30 U/L (ref 15–41)
Albumin: 2.8 g/dL — ABNORMAL LOW (ref 3.5–5.0)
Alkaline Phosphatase: 55 U/L (ref 38–126)
Anion gap: 13 (ref 5–15)
BUN: 18 mg/dL (ref 6–20)
CO2: 19 mmol/L — ABNORMAL LOW (ref 22–32)
Calcium: 8.7 mg/dL — ABNORMAL LOW (ref 8.9–10.3)
Chloride: 105 mmol/L (ref 98–111)
Creatinine, Ser: 1.21 mg/dL — ABNORMAL HIGH (ref 0.44–1.00)
GFR calc Af Amer: 58 mL/min — ABNORMAL LOW (ref >60)
GFR calc non Af Amer: 50 mL/min — ABNORMAL LOW (ref >60)
Glucose, Bld: 106 mg/dL — ABNORMAL HIGH (ref 70–99)
Potassium: 3.9 mmol/L (ref 3.5–5.1)
Sodium: 137 mmol/L (ref 135–145)
Total Bilirubin: 0.7 mg/dL (ref 0.3–1.2)
Total Protein: 6.3 g/dL — ABNORMAL LOW (ref 6.5–8.1)

## 2020-03-22 LAB — C DIFFICILE QUICK SCREEN W PCR REFLEX
C Diff antigen: NEGATIVE
C Diff interpretation: NOT DETECTED
C Diff toxin: NEGATIVE

## 2020-03-22 LAB — PHOSPHORUS: Phosphorus: 3.6 mg/dL (ref 2.5–4.6)

## 2020-03-22 MED ORDER — SODIUM CHLORIDE 0.9 % IV SOLN
INTRAVENOUS | Status: DC | PRN
  Administered 2020-03-22: 250 mL via INTRAVENOUS

## 2020-03-22 MED ORDER — NICOTINE 21 MG/24HR TD PT24
21.0000 mg | MEDICATED_PATCH | Freq: Every day | TRANSDERMAL | Status: DC
  Administered 2020-03-22 – 2020-03-28 (×7): 21 mg via TRANSDERMAL
  Filled 2020-03-22 (×7): qty 1

## 2020-03-22 MED ORDER — ASPIRIN 81 MG PO CHEW
81.0000 mg | CHEWABLE_TABLET | ORAL | Status: AC
  Administered 2020-03-23: 81 mg via ORAL
  Filled 2020-03-22: qty 1

## 2020-03-22 MED ORDER — SODIUM CHLORIDE 0.9% FLUSH: 3.0000 mL | INTRAVENOUS | Status: DC | PRN

## 2020-03-22 MED ORDER — TRAZODONE HCL 50 MG PO TABS
50.0000 mg | ORAL_TABLET | Freq: Every evening | ORAL | Status: DC | PRN
  Administered 2020-03-22 – 2020-03-23 (×2): 50 mg via ORAL
  Filled 2020-03-22 (×3): qty 1

## 2020-03-22 MED ORDER — SODIUM CHLORIDE 0.9 % IV SOLN: 250.0000 mL | INTRAVENOUS | Status: DC | PRN

## 2020-03-22 MED ORDER — SODIUM CHLORIDE 0.9 % IV SOLN: INTRAVENOUS | Status: DC

## 2020-03-22 MED ORDER — LEVALBUTEROL HCL 1.25 MG/0.5ML IN NEBU: 1.2500 mg | INHALATION_SOLUTION | Freq: Two times a day (BID) | RESPIRATORY_TRACT | Status: DC

## 2020-03-22 MED ORDER — LEVALBUTEROL HCL 1.25 MG/0.5ML IN NEBU
1.2500 mg | INHALATION_SOLUTION | Freq: Four times a day (QID) | RESPIRATORY_TRACT | Status: DC | PRN
  Administered 2020-03-22 – 2020-03-23 (×2): 1.25 mg via RESPIRATORY_TRACT
  Filled 2020-03-22 (×2): qty 0.5

## 2020-03-22 MED ORDER — SODIUM CHLORIDE 0.9% FLUSH
3.0000 mL | Freq: Two times a day (BID) | INTRAVENOUS | Status: DC
  Administered 2020-03-24 – 2020-03-26 (×3): 3 mL via INTRAVENOUS

## 2020-03-22 MED ORDER — HEPARIN SODIUM (PORCINE) 5000 UNIT/ML IJ SOLN
5000.0000 [IU] | Freq: Three times a day (TID) | INTRAMUSCULAR | Status: DC
  Administered 2020-03-22 – 2020-03-23 (×4): 5000 [IU] via SUBCUTANEOUS
  Filled 2020-03-22 (×4): qty 1

## 2020-03-22 NOTE — Progress Notes (Signed)
ANTICOAGULATION CONSULT NOTE - Initial Consult  Pharmacy Consult for heparin Indication: VTE prophylaxis  Allergies  Allergen Reactions  . Acthar Hp [Corticotropin] Other (See Comments)    Throat swelling and coughing up blood  . Codeine   . Prednisone     Patient Measurements: Height: 5\' 3"  (160 cm) Weight: 67.2 kg (148 lb 2.4 oz) IBW/kg (Calculated) : 52.4   Labs: Recent Labs    03/20/20 0232 03/20/20 0232 03/20/20 0441 03/20/20 0810 03/20/20 1701 03/20/20 1701 03/21/20 0645 03/22/20 0233  HGB 10.3*   < >  --   --  9.2*   < > 9.2* 8.7*  HCT 33.3*   < >  --   --  28.9*  --  28.6* 27.3*  PLT 363  --   --   --   --   --  306 294  CREATININE 1.53*   < >  --   --  1.48*  --  1.29* 1.21*  CKTOTAL  --   --   --  205  --   --   --   --   TROPONINIHS 2,581*  --  2,513*  --   --   --   --   --    < > = values in this interval not displayed.    Estimated Creatinine Clearance: 47.2 mL/min (A) (by C-G formula based on SCr of 1.21 mg/dL (H)).   Assessment: 59 yo female initially on IV heparin for possible PE/NSTEMI, turned off 4/13.  Pharmacy asked to add heparin for DVT prophylaxis.  Goal of Therapy:  Monitor platelets by anticoagulation protocol: Yes   Plan:  Heparin 5000 units sq q 8 hrs. Pharmacy will sign-off.  Marguerite Olea, [name] Clinical Pharmacist Phone [phone]  03/22/2020 9:33 AM

## 2020-03-22 NOTE — Progress Notes (Signed)
PROGRESS NOTE    Anita [name]  [id number] DOB: March 05, 1961 DOA: 03/20/2020 PCP: [name], NP   Brief Narrative:  SHARLENE [name] is a 59 y.o. WF PMHx Multiple Sclerosis with Optic neuritis, RIGHT ear hearing loss, migraine headache, obese, GERD, arthritis, tobacco abuse, and allergies   Presents with complaints of shortness of breath.  Reports having intermittent episodes of shortness of breath lasting approximately 2 minutes before self resolving over the last 3-4 days.  During episode she would have palpitations, diaphoresis, and feel anxious.  Symptoms would gradually resolve on their own.  Over this weekend she also has reported having diarrhea 3-4 episodes per day, nausea, and mostly nonproductive cough.  Diarrhea was noted to be yellowish-orange in she did not notice any blood.  She had tried taking Mucinex, but thought that it may have been causing her to have diarrhea.  Denied having any recent sick contacts or knowledge of chest pain, abdominal pain, leg swelling, weight gain, dysuria, or vomiting,.  However, last night while trying to go to bed developed severe shortness of breath which did not go away. Laying seem to worsen symptoms.   Reports smoking half a pack cigarettes per day on average.  She has been off of treatment for MS for at least 10 years now because she felt that they did not help.  Ambulates with difficulty using a walker.  She has been muscle spasms and utilizes Tylenol to treat symptoms.  Patient notes family history significant for mother who had a heart attack at age 52.   ED Course: Upon admission into the emergency department patient was seen to be afebrile with pulse 113-153, respirations 23-30, blood pressures maintained, and O2 saturations 93-98% on room air.  Labs significant for WBC 8.1, hemoglobin 10.3, CO2 16, BUN 21, creatinine 1.3, glucose 195, anion gap 18, troponin 2581-> 2513, and BNP 1255.6.  Chest x-ray showed diffuse  interstitial process concerning for atypical pneumonia versus edema.  Patient had been given heparin bolus of 4000 units, Rocephin azithromycin, and 1 L normal saline IV fluids.  Cardiology has been consulted due to the elevated troponin.  TRH called to admit.   Subjective: 4/15 afebrile overnight, A/O x4, negative CP, positive S OB but improving.  Positive diarrhea but improving.  Assessment & Plan:   Principal Problem:   Acute respiratory distress Active Problems:   TOBACCO ABUSE   Multiple sclerosis (HCC)   Acute respiratory failure with hypoxia (HCC)   Prolonged QT interval   NSTEMI (non-ST elevated myocardial infarction) (HCC)   SIRS (systemic inflammatory response syndrome) (HCC)   Normocytic anemia   Pneumonia   AKI (acute kidney injury) ([place])   Respiratory distress   Atypical pneumonia   Tobacco abuse   Elevated troponin   Acute systolic CHF (congestive heart failure) (HCC)   Pulmonary hypertension (HCC)   Retroperitoneal mass   Insomnia  Acute respiratory failure with hypoxia -Xopenex QID -Flutter valve -Incentive spirometer -Complete 7-day course antibiotics.  Azithromycin discontinued secondary to prolonged QT interval. -Discussed case with pharmacy and patient has been on high dose of steroids in the past for her MS.  Solu-Medrol 60 mg daily. -Titrate O2 to maintain SPO2> 88%  Atypical pneumonia -Patient CT angiogram 4/13 consistent with viral/atypical pneumonia see results below -See acute respiratory distress -All viral panels negative see results below   SIRS:  -Initially presented tachycardia, tachypnea, WBC were WNL. -Most likely respiratory cause. -Blood cultures NGTD   Tobacco abuse -Patient was counseled on  need to absolutely discontinue smoking forever.  Had been smoking 1/2 PPD since 59 years old -Nicotine patch  Elevated troponin -Initially thought to be demand ischemia however echocardiogram shows acute systolic CHF and acute pulmonary  HTN. -4/14 cardiology notified concerning findings awaiting recommendations on if cardiac catheterization to occur. -Cardiology recommends [facility] this admission but would like her to be fever free at least 48 hours prior to procedure. -We will monitor patient's symptoms to ensure she meets cardiology parameters for cardiac catheterization. -4/15 patient has been fever free for 48 hours, normal WBC.  Should meet criteria for cardiology to perform cardiac catheterization.  Acute systolic CHF/NSTEMI -EF 40 to 45% see echocardiogram results below -Strict in and out +683.54ml -Daily weight -See elevated troponin -Scheduled for cardiac catheterization 4/16  Acute pulmonary hypertension -See CHF/elevated troponin  Acute kidney injury vs CKD -Previous creatinine= 1.08 on 01/03/2011 -Most likely patient's renal function has been worsening over the years and she has not known this. Recent Labs  Lab 03/20/20 0232 03/20/20 1701 03/21/20 0645 03/22/20 0233  CREATININE 1.53* 1.48* 1.29* 1.21*  -Gently hydrate normal saline 46ml/hr  Prolonged QT interval: Acute.  QTc initially 586. -Correct any electrolyte abnormalities -Avoid any QT prolonging medication -Recheck EKG in a.m.   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 -After resolution of cardiac issues will consult surgery and address.    Metabolic acidosis with elevated anion gap -4/14 resolved  Multiple sclerosis:  -Patient reports that she has been off of any kind of treatment over the last 10 years as she felt they were having no effect.  Patient reports that she has been using Tylenol and ibuprofen. -Check acetaminophen and salicylate level -Robaxin as needed for muscle spasms   Normocytic anemia: Acute.  Hemoglobin 10.3 g/dL on admission, but previously noted to be within normal limits. -Recheck H&H -Transfuse for hemoglobin<7   Diarrhea  -C. difficile pending -Stool culture  pending  Insomnia -Trazodone 50 mg QHS PRN     DVT prophylaxis: Subcu heparin Code Status:  Family Communication: 4/15 spoke with [name] (daughter) counseled on plan of care answered all questions Disposition Plan:  1.  Where the patient is from 2.  Anticipated d/c place. 3.  Barriers to d/c per cardiology   Consultants:  Cardiology    Procedures/Significant Events:  4/13 Echocardiogram;Left Ventricle: LVEF=40 -45%.-Global hypokinesis.  -Left ventricular diastolic function could not be evaluated due to mitral stenosis.  Right Ventricle:  moderately elevated pulmonary artery systolic pressure.  The tricuspid regurgitant velocity is 3.30 m/s, and with an assumed right atrial pressure of 15 mmHg, the estimated right ventricular systolic pressure is 0000000 mmHg.  Mitral Valve: Rheumatic. There is moderate thickening of the mitral valve leaflet(s). Moderate mitral subvalvular thickening/fibrosis. Moderate to severe mitral valve regurgitation, with  posteriorly-directed jet. Mild mitral valve stenosis. The mean mitral valve gradient is 7.3 mmHg with average heart  rate of 105 bpm.  Venous: The inferior vena cava is dilated in size with less than 50% respiratory variability, suggesting right atrial pressure of 15 mmHg.  4/14 CTA chest PE protocol;-ground-glass opacities with history favoring atypical/viral pneumonia. -Small pleural effusions and interlobular septal thickening not typically associated with viral pneumonia, findings could reflect atypical edema or failure superimposed on infection. -Left retroperitoneal mass, ganglioneuroma at 2007 pathology.There is residual or recurrent mass adjacent to the retroperitoneal clips, 5.6 cm. Was the patient is prior resection subtotal?    I have personally reviewed and interpreted all radiology studies  and my findings are as above.  VENTILATOR SETTINGS:    Cultures 4/13 SARS coronavirus negative 4/13 influenza A/B negative 4/13  respiratory virus panel negative 4/13 HIV nonreactive 4/13 blood right forearm NGTD 4/13 blood left arm NGTD 4/14 C. difficile antigen/toxin negative 4/14 GI panel pending     Antimicrobials: Anti-infectives (From admission, onward)   Start     Stop   03/21/20 0630  cefTRIAXone (ROCEPHIN) 1 g in sodium chloride 0.9 % 100 mL IVPB         03/20/20 1430  doxycycline (VIBRA-TABS) tablet 100 mg         03/20/20 0545  cefTRIAXone (ROCEPHIN) 1 g in sodium chloride 0.9 % 100 mL IVPB     03/20/20 0730   03/20/20 0545  azithromycin (ZITHROMAX) tablet 500 mg     03/20/20 [id number]       Devices    LINES / TUBES:      Continuous Infusions: . sodium chloride Stopped (03/22/20 0543)  . sodium chloride    . cefTRIAXone (ROCEPHIN)  IV 200 mL/hr at 03/22/20 0600     Objective: Vitals:   03/22/20 0031 03/22/20 0308 03/22/20 0540 03/22/20 0715  BP: 122/80 122/86  (!) 136/94  Pulse: (!) 110 (!) 115  74  Resp: (!) 25 20  (!) 24  Temp: 98.3 F (36.8 C) 98.4 F (36.9 C)  99.1 F (37.3 C)  TempSrc: Oral Oral  Oral  SpO2: 94% 97%  98%  Weight:   67.2 kg   Height:        Intake/Output Summary (Last 24 hours) at 03/22/2020 0817 Last data filed at 03/22/2020 0600 Gross per 24 hour  Intake 644.83 ml  Output 350 ml  Net 294.83 ml   Filed Weights   03/20/20 0506 03/21/20 0243 03/22/20 0540  Weight: 70.6 kg 68.7 kg 67.2 kg   Physical Exam:  General: A/O x4, positive acute respiratory distress Eyes: negative scleral hemorrhage, negative anisocoria, negative icterus ENT: Negative Runny nose, negative gingival bleeding, Neck:  Negative scars, masses, torticollis, lymphadenopathy, JVD Lungs: Tachypneic clear to auscultation bilaterally without wheezes or crackles Cardiovascular: Tachycardic without murmur gallop or rub normal S1 and S2 Abdomen: negative abdominal pain, nondistended, positive soft, bowel sounds, no rebound, no ascites, no appreciable mass Extremities: No significant  cyanosis, clubbing, or edema bilateral lower extremities Skin: Negative rashes, lesions, ulcers Psychiatric:  Negative depression, positive anxiety, negative fatigue, negative mania  Central nervous system:  Cranial nerves II through XII intact, tongue/uvula midline, all extremities muscle strength 5/5, sensation intact throughout, negative dysarthria, negative expressive aphasia, negative receptive aphasia.  .     Data Reviewed: Care during the described time interval was provided by me .  I have reviewed this patient's available data, including medical history, events of note, physical examination, and all test results as part of my evaluation.   CBC: Recent Labs  Lab 03/20/20 0232 03/20/20 1701 03/21/20 0645 03/22/20 0233  WBC 8.1  --  8.6 8.9  HGB 10.3* 9.2* 9.2* 8.7*  HCT 33.3* 28.9* 28.6* 27.3*  MCV 95.4  --  93.8 93.8  PLT 363  --  306 [id number]   Basic Metabolic Panel: Recent Labs  Lab 03/20/20 0232 03/20/20 0743 03/20/20 1701 03/21/20 0645 03/22/20 0233  NA 136  --  134* 138 137  K 4.0  --  3.2* 3.8 3.9  CL 102  --  105 106 105  CO2 16*  --  16* 19* 19*  GLUCOSE 185*  --  115* 107* 106*  BUN 21*  --  19 20 18   CREATININE 1.53*  --  1.48* 1.29* 1.21*  CALCIUM 9.1  --  8.3* 8.5* 8.7*  MG  --  1.8  --  2.0 2.1  PHOS  --   --   --   --  3.6   GFR: Estimated Creatinine Clearance: 47.2 mL/min (A) (by C-G formula based on SCr of 1.21 mg/dL (H)). Liver Function Tests: Recent Labs  Lab 03/20/20 0826 03/22/20 0233  AST 18 30  ALT 11 17  ALKPHOS 56 55  BILITOT 0.7 0.7  PROT 6.2* 6.3*  ALBUMIN 3.1* 2.8*   No results for input(s): LIPASE, AMYLASE in the last 168 hours. No results for input(s): AMMONIA in the last 168 hours. Coagulation Profile: No results for input(s): INR, PROTIME in the last 168 hours. Cardiac Enzymes: Recent Labs  Lab 03/20/20 0810  CKTOTAL 205   BNP (last 3 results) No results for input(s): PROBNP in the last 8760 hours. HbA1C: No  results for input(s): HGBA1C in the last 72 hours. CBG: No results for input(s): GLUCAP in the last 168 hours. Lipid Profile: Recent Labs    03/20/20 0743  TRIG 80   Thyroid Function Tests: Recent Labs    03/20/20 0743  TSH 0.963   Anemia Panel: Recent Labs    03/20/20 0743  FERRITIN 123   Urine analysis:    Component Value Date/Time   COLORURINE YELLOW 03/20/2020 1600   APPEARANCEUR CLEAR 03/20/2020 1600   LABSPEC 1.026 03/20/2020 1600   PHURINE 5.0 03/20/2020 1600   GLUCOSEU NEGATIVE 03/20/2020 1600   HGBUR MODERATE (A) 03/20/2020 1600   BILIRUBINUR NEGATIVE 03/20/2020 1600   KETONESUR NEGATIVE 03/20/2020 1600   PROTEINUR NEGATIVE 03/20/2020 1600   NITRITE NEGATIVE 03/20/2020 1600   LEUKOCYTESUR SMALL (A) 03/20/2020 1600   Sepsis Labs: @LABRCNTIP (procalcitonin:4,lacticidven:4)  ) Recent Results (from the past 240 hour(s))  Respiratory Panel by RT PCR (Flu A&B, Covid) -     Status: None   Collection Time: 03/20/20  5:54 AM  Result Value Ref Range Status   SARS Coronavirus 2 by RT PCR NEGATIVE NEGATIVE Final    Comment: (NOTE) SARS-CoV-2 target nucleic acids are NOT DETECTED. The SARS-CoV-2 RNA is generally detectable in upper respiratoy specimens during the acute phase of infection. The lowest concentration of SARS-CoV-2 viral copies this assay can detect is 131 copies/mL. A negative result does not preclude SARS-Cov-2 infection and should not be used as the sole basis for treatment or other patient management decisions. A negative result may occur with  improper specimen collection/handling, submission of specimen other than nasopharyngeal swab, presence of viral mutation(s) within the areas targeted by this assay, and inadequate number of viral copies (<131 copies/mL). A negative result must be combined with clinical observations, patient history, and epidemiological information. The expected result is Negative. Fact Sheet for Patients:    PinkCheek.be Fact Sheet for Healthcare Providers:  GravelBags.it This test is not yet ap proved or cleared by the [place] FDA and  has been authorized for detection and/or diagnosis of SARS-CoV-2 by FDA under an Emergency Use Authorization (EUA). This EUA will remain  in effect (meaning this test can be used) for the duration of the COVID-19 declaration under Section 564(b)(1) of the Act, 21 U.S.C. section 360bbb-3(b)(1), unless the authorization is terminated or revoked sooner.    Influenza A by PCR NEGATIVE NEGATIVE Final   Influenza B by PCR NEGATIVE NEGATIVE Final    Comment: (  NOTE) The Xpert Xpress SARS-CoV-2/FLU/RSV assay is intended as an aid in  the diagnosis of influenza from Nasopharyngeal swab specimens and  should not be used as a sole basis for treatment. Nasal washings and  aspirates are unacceptable for Xpert Xpress SARS-CoV-2/FLU/RSV  testing. Fact Sheet for Patients: PinkCheek.be Fact Sheet for Healthcare Providers: GravelBags.it This test is not yet approved or cleared by the [place] FDA and  has been authorized for detection and/or diagnosis of SARS-CoV-2 by  FDA under an Emergency Use Authorization (EUA). This EUA will remain  in effect (meaning this test can be used) for the duration of the  Covid-19 declaration under Section 564(b)(1) of the Act, 21  U.S.C. section 360bbb-3(b)(1), unless the authorization is  terminated or revoked. Performed at [facility], [place] [address] [place], [place] [address]   Respiratory Panel by PCR     Status: None   Collection Time: 03/20/20  5:54 AM   Specimen: Nasopharyngeal Swab; Respiratory  Result Value Ref Range Status   Adenovirus NOT DETECTED NOT DETECTED Final   Coronavirus 229E NOT DETECTED NOT DETECTED Final    Comment: (NOTE) The Coronavirus on the Respiratory Panel, DOES NOT test  for the novel  Coronavirus (2019 nCoV)    Coronavirus HKU1 NOT DETECTED NOT DETECTED Final   Coronavirus NL63 NOT DETECTED NOT DETECTED Final   Coronavirus OC43 NOT DETECTED NOT DETECTED Final   Metapneumovirus NOT DETECTED NOT DETECTED Final   Rhinovirus / Enterovirus NOT DETECTED NOT DETECTED Final   Influenza A NOT DETECTED NOT DETECTED Final   Influenza B NOT DETECTED NOT DETECTED Final   Parainfluenza Virus 1 NOT DETECTED NOT DETECTED Final   Parainfluenza Virus 2 NOT DETECTED NOT DETECTED Final   Parainfluenza Virus 3 NOT DETECTED NOT DETECTED Final   Parainfluenza Virus 4 NOT DETECTED NOT DETECTED Final   Respiratory Syncytial Virus NOT DETECTED NOT DETECTED Final   Bordetella pertussis NOT DETECTED NOT DETECTED Final   Chlamydophila pneumoniae NOT DETECTED NOT DETECTED Final   Mycoplasma pneumoniae NOT DETECTED NOT DETECTED Final    Comment: Performed at [facility], [place]. [address] [place], [place] [address]  Blood Culture (routine x 2)     Status: None (Preliminary result)   Collection Time: 03/20/20  7:31 AM   Specimen: BLOOD RIGHT FOREARM  Result Value Ref Range Status   Specimen Description BLOOD RIGHT FOREARM  Final   Special Requests   Final    BOTTLES DRAWN AEROBIC AND ANAEROBIC Blood Culture adequate volume Performed at [facility], [place] [address], [place], [place] [address]    Culture NO GROWTH 1 DAY  Final   Report Status PENDING  Incomplete  Blood Culture (routine x 2)     Status: None (Preliminary result)   Collection Time: 03/20/20  8:06 AM   Specimen: BLOOD LEFT ARM  Result Value Ref Range Status   Specimen Description BLOOD LEFT ARM  Final   Special Requests   Final    BOTTLES DRAWN AEROBIC AND ANAEROBIC Blood Culture adequate volume Performed at [facility], [place] [address] [place], [place] [address]    Culture NO GROWTH 1 DAY  Final   Report Status PENDING  Incomplete  MRSA PCR Screening     Status: None   Collection Time:  03/20/20  3:52 PM   Specimen: Nasopharyngeal  Result Value Ref Range Status   MRSA by PCR NEGATIVE NEGATIVE Final    Comment:  The GeneXpert MRSA Assay (FDA approved for NASAL specimens only), is one component of a comprehensive MRSA colonization surveillance program. It is not intended to diagnose MRSA infection nor to guide or monitor treatment for MRSA infections. Performed at [facility], [place] [address] [place], [place] [address]          Radiology Studies: ECHOCARDIOGRAM COMPLETE  Result Date: 03/20/2020    ECHOCARDIOGRAM REPORT   Patient Name:   Anita [name] Date of Exam: 03/20/2020 Medical Rec #:  [id number]             Height:       63.0 in Accession #:    [id number]            Weight:       155.7 lb Date of Birth:  [birth year]             BSA:          1.738 m Patient Age:    59 years              BP:           124/91 mmHg Patient Gender: F                     HR:           111 bpm. Exam Location:  Inpatient Procedure: 2D Echo Indications:    dyspnea 428.31  History:        Patient has no prior history of Echocardiogram examinations.                 Risk Factors:Current Smoker.  Sonographer:    Jannett Celestine RDCS (AE) Referring Phys: [phone] RONDELL A SMITH IMPRESSIONS  1. Left ventricular ejection fraction, by estimation, is 40 to 45%. The left ventricle has mildly decreased function. The left ventricle demonstrates global hypokinesis. The left ventricular internal cavity size was mildly dilated. Left ventricular diastolic function could not be evaluated.  2. Right ventricular systolic function is normal. The right ventricular size is normal. There is moderately elevated pulmonary artery systolic pressure.  3. Left atrial size was mildly dilated.  4. Moderate mitral subvalvular thickening/fibrosis.  5. The mitral valve is rheumatic. Moderate to severe mitral valve regurgitation. Mild mitral stenosis. The mean mitral valve gradient is 7.3 mmHg with average heart  rate of 105 bpm.  6. The aortic valve is normal in structure. Aortic valve regurgitation is not visualized.  7. The inferior vena cava is dilated in size with <50% respiratory variability, suggesting right atrial pressure of 15 mmHg. Conclusion(s)/Recommendation(s): Consider TEE for better mitral regurgitation evaluation. FINDINGS  Left Ventricle: Left ventricular ejection fraction, by estimation, is 40 to 45%. The left ventricle has mildly decreased function. The left ventricle demonstrates global hypokinesis. The left ventricular internal cavity size was mildly dilated. There is  no left ventricular hypertrophy. Left ventricular diastolic function could not be evaluated due to mitral stenosis. Left ventricular diastolic function could not be evaluated. Right Ventricle: The right ventricular size is normal. No increase in right ventricular wall thickness. Right ventricular systolic function is normal. There is moderately elevated pulmonary artery systolic pressure. The tricuspid regurgitant velocity is 3.30 m/s, and with an assumed right atrial pressure of 15 mmHg, the estimated right ventricular systolic pressure is 0000000 mmHg. Left Atrium: Left atrial size was mildly dilated. Right Atrium: Right atrial size was normal in size. Pericardium: There is no evidence of pericardial effusion. Mitral Valve: The  mitral valve is rheumatic. There is moderate thickening of the mitral valve leaflet(s). Moderate mitral subvalvular thickening/fibrosis. Moderate to severe mitral valve regurgitation, with posteriorly-directed jet. Mild mitral valve stenosis. The mean mitral valve gradient is 7.3 mmHg with average heart rate of 105 bpm. Tricuspid Valve: The tricuspid valve is normal in structure. Tricuspid valve regurgitation is mild. Aortic Valve: The aortic valve is normal in structure. Aortic valve regurgitation is not visualized. Pulmonic Valve: The pulmonic valve was not well visualized. Pulmonic valve regurgitation is not  visualized. Aorta: The aortic root and ascending aorta are structurally normal, with no evidence of dilitation. Venous: The inferior vena cava is dilated in size with less than 50% respiratory variability, suggesting right atrial pressure of 15 mmHg. IAS/Shunts: No atrial level shunt detected by color flow Doppler.  LEFT VENTRICLE PLAX 2D LVIDd:         5.70 cm LVIDs:         4.60 cm LV PW:         0.90 cm LV IVS:        0.80 cm LVOT diam:     2.30 cm LV SV:         62 LV SV Index:   36 LVOT Area:     4.15 cm  RIGHT VENTRICLE TAPSE (M-mode): 1.3 cm LEFT ATRIUM             Index LA diam:        3.30 cm 1.90 cm/m LA Vol (A2C):   48.2 ml 27.73 ml/m LA Vol (A4C):   52.5 ml 30.20 ml/m LA Biplane Vol: 52.4 ml 30.14 ml/m  AORTIC VALVE LVOT Vmax:   102.31 cm/s LVOT Vmean:  65.204 cm/s LVOT VTI:    0.150 m  AORTA Ao Root diam: 3.30 cm MITRAL VALVE                 TRICUSPID VALVE MV Area (PHT): 2.42 cm      TR Peak grad:   43.6 mmHg MV Mean grad:  7.3 mmHg      TR Vmax:        330.00 cm/s MV Decel Time: 313 msec MR Peak grad:    76.7 mmHg   SHUNTS MR Mean grad:    49.0 mmHg   Systemic VTI:  0.15 m MR Vmax:         438.00 cm/s Systemic Diam: 2.30 cm MR Vmean:        328.0 cm/s MR PISA:         1.90 cm MR PISA Eff ROA: 17 mm MR PISA Radius:  0.55 cm MV E velocity: 145.00 cm/s MV A velocity: 120.00 cm/s MV E/A ratio:  1.21 Mihai Croitoru MD Electronically signed by Sanda Klein MD Signature Date/Time: 03/20/2020/10:00:41 AM    Final         Scheduled Meds: . doxycycline  100 mg Oral Q12H  . levalbuterol  1.25 mg Nebulization BID  . nicotine  21 mg Transdermal Daily  . sodium chloride flush  3 mL Intravenous Q12H   Continuous Infusions: . sodium chloride Stopped (03/22/20 0543)  . sodium chloride    . cefTRIAXone (ROCEPHIN)  IV 200 mL/hr at 03/22/20 0600     LOS: 2 days   The patient is critically ill with multiple organ systems failure and requires high complexity decision making for assessment and  support, frequent evaluation and titration of therapies, application of advanced monitoring technologies and extensive interpretation of multiple databases. Critical Care  Time devoted to patient care services described in this note  Time spent: 40 minutes     Lynnett Langlinais, Geraldo Docker, MD [facility] Pager [phone]  If 7PM-7AM, please contact night-coverage [email or web address] Password [facility] 03/22/2020, 8:17 AM

## 2020-03-22 NOTE — Progress Notes (Signed)
Progress Note  Patient Name: Anita [name] Date of Encounter: 03/22/2020  Primary Cardiologist: Buford Dresser, MD   Subjective   No further fevers. HR improved from 120s, though still sinus tach. She is asking about the next steps, see below. Feels better since starting antibiotics, with some residual cough. No chest pain.  ID workup: -Blood Cultures negative thus far -respiratory panel negative.  -UA mildly abnormal but culture not positive at this time.  -White count remains normal.  -ESR 64, CRP 5.9 -c diff negative  Inpatient Medications    Scheduled Meds: . doxycycline  100 mg Oral Q12H  . heparin injection (subcutaneous)  5,000 Units Subcutaneous Q8H  . nicotine  21 mg Transdermal Daily  . sodium chloride flush  3 mL Intravenous Q12H   Continuous Infusions: . sodium chloride Stopped (03/22/20 0543)  . sodium chloride 50 mL/hr at 03/22/20 1013  . cefTRIAXone (ROCEPHIN)  IV 200 mL/hr at 03/22/20 0600   PRN Meds: sodium chloride, acetaminophen **OR** acetaminophen, diphenhydrAMINE, levalbuterol, methocarbamol, SUMAtriptan, traZODone   Vital Signs    Vitals:   03/22/20 0308 03/22/20 0540 03/22/20 0715 03/22/20 1137  BP: 122/86  (!) 136/94 117/90  Pulse: (!) 115  (!) 112 (!) 104  Resp: 20  (!) 24 (!) 25  Temp: 98.4 F (36.9 C)  99.1 F (37.3 C) 97.8 F (36.6 C)  TempSrc: Oral  Oral Oral  SpO2: 97%  98% 98%  Weight:  67.2 kg    Height:        Intake/Output Summary (Last 24 hours) at 03/22/2020 1322 Last data filed at 03/22/2020 0900 Gross per 24 hour  Intake 64.83 ml  Output 550 ml  Net -485.17 ml   Last 3 Weights 03/22/2020 03/21/2020 03/20/2020  Weight (lbs) 148 lb 2.4 oz 151 lb 7.3 oz 155 lb 11.2 oz  Weight (kg) 67.2 kg 68.7 kg 70.625 kg      Telemetry    Sinus tachycardia - Personally Reviewed  ECG    Sinus tachycardia - Personally Reviewed  Physical Exam   GEN: in NAD HEENT: Normal, moist mucous membranes NECK: No  JVD CARDIAC: tachycardic, regular rhythm, normal S1 and S2, no gallops. 2/6 HS murmur.Rub not appreciable today. VASCULAR: Radial pulses 2+ bilaterally.  RESPIRATORY:  Diffusely coarse ABDOMEN: Soft, non-tender, non-distended MUSCULOSKELETAL: moves all 4 limbs independently SKIN: Warm and dry, no edema NEUROLOGIC:  Alert and oriented x 3. No focal neuro deficits noted. PSYCHIATRIC:  Normal affect   Labs    High Sensitivity Troponin:   Recent Labs  Lab 03/20/20 0232 03/20/20 0441  TROPONINIHS 2,581* 2,513*      Chemistry Recent Labs  Lab 03/20/20 0232 03/20/20 0826 03/20/20 1701 03/21/20 0645 03/22/20 0233  NA   < >  --  134* 138 137  K   < >  --  3.2* 3.8 3.9  CL   < >  --  105 106 105  CO2   < >  --  16* 19* 19*  GLUCOSE   < >  --  115* 107* 106*  BUN   < >  --  _0 CREATININE   < >  --  1.48* 1.29* 1.21*  CALCIUM   < >  --  8.3* 8.5* 8.7*  PROT  --  6.2*  --   --  6.3*  ALBUMIN  --  3.1*  --   --  2.8*  AST  --  18  --   --  30  ALT  --  11  --   --  17  ALKPHOS  --  56  --   --  55  BILITOT  --  0.7  --   --  0.7  GFRNONAA   < >  --  39* 46* 50*  GFRAA   < >  --  45* 53* 58*  ANIONGAP   < >  --  _0 < > = values in this interval not displayed.     Hematology Recent Labs  Lab 03/20/20 0232 03/20/20 0232 03/20/20 1701 03/21/20 0645 03/22/20 0233  WBC 8.1  --   --  8.6 8.9  RBC 3.49*  --   --  3.05* 2.91*  HGB 10.3*   < > 9.2* 9.2* 8.7*  HCT 33.3*   < > 28.9* 28.6* 27.3*  MCV 95.4  --   --  93.8 93.8  MCH 29.5  --   --  30.2 29.9  MCHC 30.9  --   --  32.2 31.9  RDW 14.9  --   --  15.3 15.3  PLT 363  --   --  306 294   < > = values in this interval not displayed.    BNP Recent Labs  Lab 03/20/20 0441  BNP 1,255.6*     DDimer No results for input(s): DDIMER in the last 168 hours.   Radiology    No results found.  Cardiac Studies   Echo 03/20/20 1. Left ventricular ejection fraction, by estimation, is 40 to 45%. The  left  ventricle has mildly decreased function. The left ventricle  demonstrates global hypokinesis. The left ventricular internal cavity size  was mildly dilated. Left ventricular  diastolic function could not be evaluated.  2. Right ventricular systolic function is normal. The right ventricular  size is normal. There is moderately elevated pulmonary artery systolic  pressure.  3. Left atrial size was mildly dilated.  4. Moderate mitral subvalvular thickening/fibrosis.  5. The mitral valve is rheumatic. Moderate to severe mitral valve  regurgitation. Mild mitral stenosis. The mean mitral valve gradient is 7.3  mmHg with average heart rate of 105 bpm.  6. The aortic valve is normal in structure. Aortic valve regurgitation is  not visualized.  7. The inferior vena cava is dilated in size with <50% respiratory  variability, suggesting right atrial pressure of 15 mmHg.  Patient Profile     59 y.o. female with PMH multiple sclerosis presented with acute respiratory distress and fevers. Echo with mildly reduced EF, elevated troponins, but no chest pain  Assessment & Plan    Respiratory distress, pulmonary infiltrates, fever, elevated inflammatory markers: -diarrhea as well, since 4/8 per her report -cultures NGTD, c diff negative, no clear source -highly suspicious for inflammatory process -sinus tachycardia, tachypnea likely reactive to this process -no PE on CT  Abnormal EF (40-45%), no prior. Unclear cardiomyopathy. Elevated troponins -denies any chest discomfort. No prior cardiac evaluation or history. Marland Kitchen -CT with patchy infiltrates, less common for heart failure and more common for infection -we discussed cath today. With her lung abnormalities, low utility for RHC at this time. Will pursue LHC tomorrow to determine if there is an ischemic component to her cardiomyopathy -we discussed cath at length, including the entire procedure and risk of contrast nephropathy. We also discussed  options for management, depending on findings. She is in agreement for diagnostic cath and PCI if straightforward/required. She would like to avoid complex PCI  and CABG if possible, preferring medical management over those. I discussed with her that we will not know what we are dealing with until we take the pictures, which she understands. -Risks and benefits of cardiac catheterization have been discussed with the patient.  These include bleeding, infection, kidney damage, stroke, heart attack, death.  The patient understands these risks and is willing to proceed. -we spent >40 minutes discussing options at length. -NPO at midnight, LHC tomorrow.  Moderate to severe mitral regurgitation with rheumatic appearing mitral valve, without severe mitral stenosis -thickened leaflets with rheumatic movement -MVA >3 based on pressure half time -no indication for intervention at this time.  For questions or updates, please contact [place] Please consult [email or web address] for contact info under     Signed, Buford Dresser, MD  03/22/2020, 1:22 PM

## 2020-03-22 NOTE — Plan of Care (Signed)
  Problem: Education: Goal: Knowledge of General Education information will improve Description: Including pain rating scale, medication(s)/side effects and non-pharmacologic comfort measures Outcome: Progressing   Problem: Health Behavior/Discharge Planning: Goal: Ability to manage health-related needs will improve Outcome: Progressing   Problem: Clinical Measurements: Goal: Diagnostic test results will improve Outcome: Progressing Goal: Respiratory complications will improve Outcome: Progressing   Problem: Nutrition: Goal: Adequate nutrition will be maintained Outcome: Progressing   Problem: Coping: Goal: Level of anxiety will decrease Outcome: Progressing

## 2020-03-23 ENCOUNTER — Encounter (HOSPITAL_COMMUNITY)
Admission: EM | Disposition: A | Discharge status: Skilled Nursing Facility | Payer: Self-pay | Source: Home / Self Care | Attending: Internal Medicine

## 2020-03-23 ENCOUNTER — Ambulatory Visit (HOSPITAL_COMMUNITY): Admission: RE | Admit: 2020-03-23 | Payer: Medicare Other | Source: Home / Self Care | Admitting: Cardiology

## 2020-03-23 DIAGNOSIS — I2510 Atherosclerotic heart disease of native coronary artery without angina pectoris: Secondary | ICD-10-CM | POA: Diagnosis not present

## 2020-03-23 DIAGNOSIS — I429 Cardiomyopathy, unspecified: Secondary | ICD-10-CM | POA: Diagnosis not present

## 2020-03-23 DIAGNOSIS — R778 Other specified abnormalities of plasma proteins: Secondary | ICD-10-CM | POA: Diagnosis not present

## 2020-03-23 DIAGNOSIS — J189 Pneumonia, unspecified organism: Secondary | ICD-10-CM | POA: Diagnosis not present

## 2020-03-23 HISTORY — PX: LEFT HEART CATH AND CORONARY ANGIOGRAPHY: CATH118249

## 2020-03-23 LAB — GASTROINTESTINAL PANEL BY PCR, STOOL (REPLACES STOOL CULTURE)

## 2020-03-23 LAB — COMPREHENSIVE METABOLIC PANEL
ALT: 39 U/L (ref 0–44)
AST: 57 U/L — ABNORMAL HIGH (ref 15–41)
Albumin: 2.7 g/dL — ABNORMAL LOW (ref 3.5–5.0)
Alkaline Phosphatase: 64 U/L (ref 38–126)
Anion gap: 13 (ref 5–15)
BUN: 17 mg/dL (ref 6–20)
CO2: 18 mmol/L — ABNORMAL LOW (ref 22–32)
Calcium: 8.3 mg/dL — ABNORMAL LOW (ref 8.9–10.3)
Chloride: 106 mmol/L (ref 98–111)
Creatinine, Ser: 1.24 mg/dL — ABNORMAL HIGH (ref 0.44–1.00)
GFR calc Af Amer: 56 mL/min — ABNORMAL LOW (ref >60)
GFR calc non Af Amer: 48 mL/min — ABNORMAL LOW (ref >60)
Glucose, Bld: 114 mg/dL — ABNORMAL HIGH (ref 70–99)
Potassium: 3.4 mmol/L — ABNORMAL LOW (ref 3.5–5.1)
Sodium: 137 mmol/L (ref 135–145)
Total Bilirubin: 0.4 mg/dL (ref 0.3–1.2)
Total Protein: 6.1 g/dL — ABNORMAL LOW (ref 6.5–8.1)

## 2020-03-23 LAB — CBC
HCT: 26.6 % — ABNORMAL LOW (ref 36.0–46.0)
Hemoglobin: 8.5 g/dL — ABNORMAL LOW (ref 12.0–15.0)
MCH: 29.9 pg (ref 26.0–34.0)
MCHC: 32 g/dL (ref 30.0–36.0)
MCV: 93.7 fL (ref 80.0–100.0)
Platelets: 285 10*3/uL (ref 150–400)
RBC: 2.84 MIL/uL — ABNORMAL LOW (ref 3.87–5.11)
RDW: 15.2 % (ref 11.5–15.5)
WBC: 7.1 10*3/uL (ref 4.0–10.5)
nRBC: 0 % (ref 0.0–0.2)

## 2020-03-23 LAB — PHOSPHORUS: Phosphorus: 3.3 mg/dL (ref 2.5–4.6)

## 2020-03-23 LAB — LACTOFERRIN, FECAL, QUALITATIVE: Lactoferrin, Fecal, Qual: NEGATIVE

## 2020-03-23 LAB — MAGNESIUM: Magnesium: 2.2 mg/dL (ref 1.7–2.4)

## 2020-03-23 SURGERY — LEFT HEART CATH AND CORONARY ANGIOGRAPHY
Anesthesia: LOCAL

## 2020-03-23 MED ORDER — VERAPAMIL HCL 2.5 MG/ML IV SOLN
INTRAVENOUS | Status: DC | PRN
  Administered 2020-03-23: 10 mL via INTRA_ARTERIAL

## 2020-03-23 MED ORDER — METHYLPREDNISOLONE SODIUM SUCC 125 MG IJ SOLR
60.0000 mg | INTRAMUSCULAR | Status: AC
  Administered 2020-03-23 – 2020-03-26 (×4): 60 mg via INTRAVENOUS
  Filled 2020-03-23 (×4): qty 2

## 2020-03-23 MED ORDER — SODIUM CHLORIDE 0.9% FLUSH
3.0000 mL | Freq: Two times a day (BID) | INTRAVENOUS | Status: DC
  Administered 2020-03-23 – 2020-03-28 (×7): 3 mL via INTRAVENOUS

## 2020-03-23 MED ORDER — MIDAZOLAM HCL 2 MG/2ML IJ SOLN
INTRAMUSCULAR | Status: AC
  Filled 2020-03-23: qty 2

## 2020-03-23 MED ORDER — HYDRALAZINE HCL 20 MG/ML IJ SOLN: 10.0000 mg | INTRAMUSCULAR | Status: AC | PRN

## 2020-03-23 MED ORDER — LABETALOL HCL 5 MG/ML IV SOLN: 10.0000 mg | INTRAVENOUS | Status: AC | PRN

## 2020-03-23 MED ORDER — VERAPAMIL HCL 2.5 MG/ML IV SOLN
INTRAVENOUS | Status: AC
  Filled 2020-03-23: qty 2

## 2020-03-23 MED ORDER — LEVALBUTEROL HCL 1.25 MG/0.5ML IN NEBU: 1.2500 mg | INHALATION_SOLUTION | Freq: Four times a day (QID) | RESPIRATORY_TRACT | Status: DC

## 2020-03-23 MED ORDER — SODIUM CHLORIDE 0.9% FLUSH: 3.0000 mL | INTRAVENOUS | Status: DC | PRN

## 2020-03-23 MED ORDER — LIDOCAINE HCL (PF) 1 % IJ SOLN
INTRAMUSCULAR | Status: AC
  Filled 2020-03-23: qty 30

## 2020-03-23 MED ORDER — CARVEDILOL 3.125 MG PO TABS
3.1250 mg | ORAL_TABLET | Freq: Two times a day (BID) | ORAL | Status: DC
  Administered 2020-03-23 – 2020-03-25 (×4): 3.125 mg via ORAL
  Filled 2020-03-23 (×4): qty 1

## 2020-03-23 MED ORDER — POTASSIUM CHLORIDE CRYS ER 20 MEQ PO TBCR: 20.0000 meq | EXTENDED_RELEASE_TABLET | Freq: Once | ORAL | Status: AC

## 2020-03-23 MED ORDER — SODIUM CHLORIDE 0.9 % IV SOLN: INTRAVENOUS | Status: AC

## 2020-03-23 MED ORDER — HEPARIN (PORCINE) IN NACL 1000-0.9 UT/500ML-% IV SOLN
INTRAVENOUS | Status: DC | PRN
  Administered 2020-03-23: 500 mL

## 2020-03-23 MED ORDER — HEPARIN SODIUM (PORCINE) 1000 UNIT/ML IJ SOLN
INTRAMUSCULAR | Status: AC
  Filled 2020-03-23: qty 1

## 2020-03-23 MED ORDER — FENTANYL CITRATE (PF) 100 MCG/2ML IJ SOLN
INTRAMUSCULAR | Status: DC | PRN
  Administered 2020-03-23 (×2): 25 ug via INTRAVENOUS

## 2020-03-23 MED ORDER — SODIUM CHLORIDE 0.9 % IV SOLN
250.0000 mL | INTRAVENOUS | Status: DC | PRN
  Administered 2020-03-25: 08:00:00 1000 mL via INTRAVENOUS

## 2020-03-23 MED ORDER — HEPARIN SODIUM (PORCINE) 5000 UNIT/ML IJ SOLN
5000.0000 [IU] | Freq: Three times a day (TID) | INTRAMUSCULAR | Status: DC
  Administered 2020-03-24 – 2020-03-28 (×14): 5000 [IU] via SUBCUTANEOUS
  Filled 2020-03-23 (×15): qty 1

## 2020-03-23 MED ORDER — IOHEXOL 350 MG/ML SOLN
INTRAVENOUS | Status: DC | PRN
  Administered 2020-03-23: 45 mL

## 2020-03-23 MED ORDER — LEVALBUTEROL HCL 1.25 MG/0.5ML IN NEBU
1.2500 mg | INHALATION_SOLUTION | Freq: Four times a day (QID) | RESPIRATORY_TRACT | Status: DC | PRN
  Administered 2020-03-23: 15:00:00 1.25 mg via RESPIRATORY_TRACT
  Filled 2020-03-23: qty 0.5

## 2020-03-23 MED ORDER — HEPARIN (PORCINE) IN NACL 1000-0.9 UT/500ML-% IV SOLN
INTRAVENOUS | Status: AC
  Filled 2020-03-23: qty 1000

## 2020-03-23 MED ORDER — HEPARIN SODIUM (PORCINE) 1000 UNIT/ML IJ SOLN
INTRAMUSCULAR | Status: DC | PRN
  Administered 2020-03-23: 4000 [IU] via INTRAVENOUS

## 2020-03-23 MED ORDER — FENTANYL CITRATE (PF) 100 MCG/2ML IJ SOLN
INTRAMUSCULAR | Status: AC
  Filled 2020-03-23: qty 2

## 2020-03-23 MED ORDER — LIDOCAINE HCL (PF) 1 % IJ SOLN
INTRAMUSCULAR | Status: DC | PRN
  Administered 2020-03-23: 2 mL

## 2020-03-23 MED ORDER — POTASSIUM CHLORIDE CRYS ER 20 MEQ PO TBCR
40.0000 meq | EXTENDED_RELEASE_TABLET | Freq: Once | ORAL | Status: AC
  Administered 2020-03-23: 40 meq via ORAL
  Filled 2020-03-23: qty 2

## 2020-03-23 MED ORDER — MIDAZOLAM HCL 2 MG/2ML IJ SOLN
INTRAMUSCULAR | Status: DC | PRN
  Administered 2020-03-23 (×2): 1 mg via INTRAVENOUS

## 2020-03-23 MED ORDER — BUDESONIDE 0.25 MG/2ML IN SUSP
0.2500 mg | Freq: Two times a day (BID) | RESPIRATORY_TRACT | Status: DC
  Administered 2020-03-24 – 2020-03-28 (×9): 0.25 mg via RESPIRATORY_TRACT
  Filled 2020-03-23 (×9): qty 2

## 2020-03-23 SURGICAL SUPPLY — 11 items
CATH INFINITI 5FR ANG PIGTAIL (CATHETERS) ×2 IMPLANT
CATH OPTITORQUE TIG 4.0 5F (CATHETERS) ×2 IMPLANT
DEVICE RAD COMP TR BAND LRG (VASCULAR PRODUCTS) ×2 IMPLANT
GLIDESHEATH SLEND SS 6F .021 (SHEATH) ×2 IMPLANT
GUIDEWIRE INQWIRE 1.5J.035X260 (WIRE) ×1 IMPLANT
INQWIRE 1.5J .035X260CM (WIRE) ×2
KIT HEART LEFT (KITS) ×2 IMPLANT
PACK CARDIAC CATHETERIZATION (CUSTOM PROCEDURE TRAY) ×2 IMPLANT
SHEATH PROBE COVER 6X72 (BAG) ×2 IMPLANT
TRANSDUCER W/STOPCOCK (MISCELLANEOUS) ×2 IMPLANT
TUBING CIL FLEX 10 FLL-RA (TUBING) ×2 IMPLANT

## 2020-03-23 NOTE — Interval H&P Note (Signed)
History and Physical Interval Note:  03/23/2020 9:16 AM  Anita [name]  has presented today for surgery, with the diagnosis of cardiomyopathy.  The various methods of treatment have been discussed with the patient and family. After consideration of risks, benefits and other options for treatment, the patient has consented to  Procedure(s): LEFT HEART CATH AND CORONARY ANGIOGRAPHY (N/A) as a surgical intervention.  The patient's history has been reviewed, patient examined, no change in status, stable for surgery.  I have reviewed the patient's chart and labs.  Questions were answered to the patient's satisfaction.    Cath Lab Visit (complete for each Cath Lab visit)  Clinical Evaluation Leading to the Procedure:   ACS: No.  Non-ACS:    Anginal Classification: No Symptoms CHF symptoms   Anti-ischemic medical therapy: Minimal Therapy (1 class of medications)  Non-Invasive Test Results: Equivocal test results + Troponin with mildly reduced LVEF & grossly abnormal Chest CT  Prior CABG: No previous CABG   Anita [name]

## 2020-03-23 NOTE — Progress Notes (Signed)
Progress Note  Patient Name: Anita [name] Date of Encounter: 03/23/2020  Primary Cardiologist: Buford Dresser, MD   Subjective   Remains afebrile. Reviewed plans for cath today. Again she emphasized that she would not like complex PCI or CABG, but she would like to know if there is any disease, and she would be open to a regular risk PCI. She is most concerned about the dye affecting her kidneys. Her Cr has trended down this admission, but she wants to minimize risk of it worsening.  Inpatient Medications    Scheduled Meds: . doxycycline  100 mg Oral Q12H  . heparin injection (subcutaneous)  5,000 Units Subcutaneous Q8H  . methylPREDNISolone (SOLU-MEDROL) injection  60 mg Intravenous Q24H  . nicotine  21 mg Transdermal Daily  . sodium chloride flush  3 mL Intravenous Q12H  . sodium chloride flush  3 mL Intravenous Q12H   Continuous Infusions: . sodium chloride Stopped (03/22/20 0543)  . sodium chloride 50 mL/hr at 03/23/20 0546  . sodium chloride    . sodium chloride    . cefTRIAXone (ROCEPHIN)  IV 1 g (03/23/20 0547)   PRN Meds: sodium chloride, sodium chloride, acetaminophen **OR** acetaminophen, diphenhydrAMINE, levalbuterol, methocarbamol, sodium chloride flush, SUMAtriptan, traZODone   Vital Signs    Vitals:   03/23/20 0000 03/23/20 0321 03/23/20 0500 03/23/20 0754  BP: 120/81 124/83  (!) 131/95  Pulse:  (!) 110  (!) 111  Resp: 20 (!) 25  (!) 25  Temp: 98.6 F (37 C) 98 F (36.7 C)  98.8 F (37.1 C)  TempSrc: Oral Oral  Oral  SpO2: 97% 98%  98%  Weight:   67.9 kg   Height:        Intake/Output Summary (Last 24 hours) at 03/23/2020 0837 Last data filed at 03/23/2020 0321 Gross per 24 hour  Intake 1003.91 ml  Output 900 ml  Net 103.91 ml   Last 3 Weights 03/23/2020 03/22/2020 03/21/2020  Weight (lbs) 149 lb 11.1 oz 148 lb 2.4 oz 151 lb 7.3 oz  Weight (kg) 67.9 kg 67.2 kg 68.7 kg      Telemetry    Sinus tachycardia, 100s-120s - Personally  Reviewed  ECG    4/15 Sinus tachycardia - Personally Reviewed  Physical Exam   GEN:  in no acute distress HEENT: Normal, moist mucous membranes NECK: No JVD CARDIAC: tachycardic, regular rhythm, normal S1 and S2, no rubs or gallops. 2/6 HS murmur. VASCULAR: Radial pulses 2+ bilaterally.  RESPIRATORY:  Diffusely coarse ABDOMEN: Soft, non-tender, non-distended MUSCULOSKELETAL:  Moves all 4 limbs independently SKIN: Warm and dry, no edema NEUROLOGIC:  Alert and oriented x 3. No focal neuro deficits noted. PSYCHIATRIC:  Normal affect   Labs    High Sensitivity Troponin:   Recent Labs  Lab 03/20/20 0232 03/20/20 0441  TROPONINIHS 2,581* 2,513*      Chemistry Recent Labs  Lab 03/20/20 0826 03/20/20 1701 03/21/20 0645 03/22/20 0233 03/23/20 0218  NA  --    < > 138 137 137  K  --    < > 3.8 3.9 3.4*  CL  --    < > 106 105 106  CO2  --    < > 19* 19* 18*  GLUCOSE  --    < > 107* 106* 114*  BUN  --    < > _0 CREATININE  --    < > 1.29* 1.21* 1.24*  CALCIUM  --    < > 8.5*  8.7* 8.3*  PROT 6.2*  --   --  6.3* 6.1*  ALBUMIN 3.1*  --   --  2.8* 2.7*  AST 18  --   --  30 57*  ALT 11  --   --  17 39  ALKPHOS 56  --   --  55 64  BILITOT 0.7  --   --  0.7 0.4  GFRNONAA  --    < > 46* 50* 48*  GFRAA  --    < > 53* 58* 56*  ANIONGAP  --    < > _0 < > = values in this interval not displayed.     Hematology Recent Labs  Lab 03/21/20 0645 03/22/20 0233 03/23/20 0218  WBC 8.6 8.9 7.1  RBC 3.05* 2.91* 2.84*  HGB 9.2* 8.7* 8.5*  HCT 28.6* 27.3* 26.6*  MCV 93.8 93.8 93.7  MCH 30.2 29.9 29.9  MCHC 32.2 31.9 32.0  RDW 15.3 15.3 15.2  PLT 306 294 285    BNP Recent Labs  Lab 03/20/20 0441  BNP 1,255.6*     DDimer No results for input(s): DDIMER in the last 168 hours.   Radiology    No results found.  Cardiac Studies   Echo 03/20/20 1. Left ventricular ejection fraction, by estimation, is 40 to 45%. The  left ventricle has mildly decreased  function. The left ventricle  demonstrates global hypokinesis. The left ventricular internal cavity size  was mildly dilated. Left ventricular  diastolic function could not be evaluated.  2. Right ventricular systolic function is normal. The right ventricular  size is normal. There is moderately elevated pulmonary artery systolic  pressure.  3. Left atrial size was mildly dilated.  4. Moderate mitral subvalvular thickening/fibrosis.  5. The mitral valve is rheumatic. Moderate to severe mitral valve  regurgitation. Mild mitral stenosis. The mean mitral valve gradient is 7.3  mmHg with average heart rate of 105 bpm.  6. The aortic valve is normal in structure. Aortic valve regurgitation is  not visualized.  7. The inferior vena cava is dilated in size with <50% respiratory  variability, suggesting right atrial pressure of 15 mmHg.  Patient Profile     59 y.o. female with PMH multiple sclerosis presented with acute respiratory distress and fevers. Echo with mildly reduced EF, elevated troponins, but no chest pain  Assessment & Plan    Respiratory distress, pulmonary infiltrates, fever, elevated inflammatory markers: -Blood Cultures negative thus far -respiratory panel negative.  -UA mildly abnormal but culture not positive at this time.  -White count remains normal.  -ESR 64, CRP 5.9 -c diff negative, diarrhea as well, since 4/8 per her report -highly suspicious for inflammatory process -sinus tachycardia, tachypnea likely reactive to this process -no PE on CT  Abnormal EF (40-45%), no prior. Unclear cardiomyopathy. -Elevated troponins this admission, but denies any chest discomfort. No prior cardiac evaluation or history. -CT with patchy infiltrates, less common for heart failure and more common for infection -see extensive discussion re: cath yesterday. She is amenable and wants to know if there is CAD, but we both agreed that with minimal symptoms a high risk intervention  is unlikely to give long term benefit. She would prefer medical management in that case. The goal is to further evaluate her cardiomyopathy. Given her active lung process (likely infectious), right heart cath low yield at this time, so will pursue coronary angiography to determine if there is an ischemic component to her cardiomyopathy. She  is open to straightforward PCI (though still understands this carries some risk) if it would help her heart pump function potentially.  Moderate to severe mitral regurgitation with rheumatic appearing mitral valve, without severe mitral stenosis -thickened leaflets with rheumatic movement -MVA >3 based on pressure half time -no indication for intervention at this time.  Acute kidney injury -peak Cr 1.52 on admission, today 1.24 -K 3.4, supplemented this AM  For questions or updates, please contact [place] Please consult [email or web address] for contact info under     Signed, Buford Dresser, MD  03/23/2020, 8:37 AM

## 2020-03-23 NOTE — H&P (View-Only) (Signed)
 Progress Note  Patient Name: Anita [name] Date of Encounter: 03/23/2020  Primary Cardiologist: Inger Wiest, MD   Subjective   Remains afebrile. Reviewed plans for cath today. Again she emphasized that she would not like complex PCI or CABG, but she would like to know if there is any disease, and she would be open to a regular risk PCI. She is most concerned about the dye affecting her kidneys. Her Cr has trended down this admission, but she wants to minimize risk of it worsening.  Inpatient Medications    Scheduled Meds: . doxycycline  100 mg Oral Q12H  . heparin injection (subcutaneous)  5,000 Units Subcutaneous Q8H  . methylPREDNISolone (SOLU-MEDROL) injection  60 mg Intravenous Q24H  . nicotine  21 mg Transdermal Daily  . sodium chloride flush  3 mL Intravenous Q12H  . sodium chloride flush  3 mL Intravenous Q12H   Continuous Infusions: . sodium chloride Stopped (03/22/20 0543)  . sodium chloride 50 mL/hr at 03/23/20 0546  . sodium chloride    . sodium chloride    . cefTRIAXone (ROCEPHIN)  IV 1 g (03/23/20 0547)   PRN Meds: sodium chloride, sodium chloride, acetaminophen **OR** acetaminophen, diphenhydrAMINE, levalbuterol, methocarbamol, sodium chloride flush, SUMAtriptan, traZODone   Vital Signs    Vitals:   03/23/20 0000 03/23/20 0321 03/23/20 0500 03/23/20 0754  BP: 120/81 124/83  (!) 131/95  Pulse:  (!) 110  (!) 111  Resp: 20 (!) 25  (!) 25  Temp: 98.6 F (37 C) 98 F (36.7 C)  98.8 F (37.1 C)  TempSrc: Oral Oral  Oral  SpO2: 97% 98%  98%  Weight:   67.9 kg   Height:        Intake/Output Summary (Last 24 hours) at 03/23/2020 0837 Last data filed at 03/23/2020 0321 Gross per 24 hour  Intake 1003.91 ml  Output 900 ml  Net 103.91 ml   Last 3 Weights 03/23/2020 03/22/2020 03/21/2020  Weight (lbs) 149 lb 11.1 oz 148 lb 2.4 oz 151 lb 7.3 oz  Weight (kg) 67.9 kg 67.2 kg 68.7 kg      Telemetry    Sinus tachycardia, 100s-120s - Personally  Reviewed  ECG    4/15 Sinus tachycardia - Personally Reviewed  Physical Exam   GEN:  in no acute distress HEENT: Normal, moist mucous membranes NECK: No JVD CARDIAC: tachycardic, regular rhythm, normal S1 and S2, no rubs or gallops. 2/6 HS murmur. VASCULAR: Radial pulses 2+ bilaterally.  RESPIRATORY:  Diffusely coarse ABDOMEN: Soft, non-tender, non-distended MUSCULOSKELETAL:  Moves all 4 limbs independently SKIN: Warm and dry, no edema NEUROLOGIC:  Alert and oriented x 3. No focal neuro deficits noted. PSYCHIATRIC:  Normal affect   Labs    High Sensitivity Troponin:   Recent Labs  Lab 03/20/20 0232 03/20/20 0441  TROPONINIHS 2,581* 2,513*      Chemistry Recent Labs  Lab 03/20/20 0826 03/20/20 1701 03/21/20 0645 03/22/20 0233 03/23/20 0218  NA  --    < > 138 137 137  K  --    < > 3.8 3.9 3.4*  CL  --    < > 106 105 106  CO2  --    < > 19* 19* 18*  GLUCOSE  --    < > 107* 106* 114*  BUN  --    < > 20 18 17  CREATININE  --    < > 1.29* 1.21* 1.24*  CALCIUM  --    < > 8.5*   8.7* 8.3*  PROT 6.2*  --   --  6.3* 6.1*  ALBUMIN 3.1*  --   --  2.8* 2.7*  AST 18  --   --  30 57*  ALT 11  --   --  17 39  ALKPHOS 56  --   --  55 64  BILITOT 0.7  --   --  0.7 0.4  GFRNONAA  --    < > 46* 50* 48*  GFRAA  --    < > 53* 58* 56*  ANIONGAP  --    < > 13 13 13   < > = values in this interval not displayed.     Hematology Recent Labs  Lab 03/21/20 0645 03/22/20 0233 03/23/20 0218  WBC 8.6 8.9 7.1  RBC 3.05* 2.91* 2.84*  HGB 9.2* 8.7* 8.5*  HCT 28.6* 27.3* 26.6*  MCV 93.8 93.8 93.7  MCH 30.2 29.9 29.9  MCHC 32.2 31.9 32.0  RDW 15.3 15.3 15.2  PLT 306 294 285    BNP Recent Labs  Lab 03/20/20 0441  BNP 1,255.6*     DDimer No results for input(s): DDIMER in the last 168 hours.   Radiology    No results found.  Cardiac Studies   Echo 03/20/20 1. Left ventricular ejection fraction, by estimation, is 40 to 45%. The  left ventricle has mildly decreased  function. The left ventricle  demonstrates global hypokinesis. The left ventricular internal cavity size  was mildly dilated. Left ventricular  diastolic function could not be evaluated.  2. Right ventricular systolic function is normal. The right ventricular  size is normal. There is moderately elevated pulmonary artery systolic  pressure.  3. Left atrial size was mildly dilated.  4. Moderate mitral subvalvular thickening/fibrosis.  5. The mitral valve is rheumatic. Moderate to severe mitral valve  regurgitation. Mild mitral stenosis. The mean mitral valve gradient is 7.3  mmHg with average heart rate of 105 bpm.  6. The aortic valve is normal in structure. Aortic valve regurgitation is  not visualized.  7. The inferior vena cava is dilated in size with <50% respiratory  variability, suggesting right atrial pressure of 15 mmHg.  Patient Profile     59 y.o. female with PMH multiple sclerosis presented with acute respiratory distress and fevers. Echo with mildly reduced EF, elevated troponins, but no chest pain  Assessment & Plan    Respiratory distress, pulmonary infiltrates, fever, elevated inflammatory markers: -Blood Cultures negative thus far -respiratory panel negative.  -UA mildly abnormal but culture not positive at this time.  -White count remains normal.  -ESR 64, CRP 5.9 -c diff negative, diarrhea as well, since 4/8 per her report -highly suspicious for inflammatory process -sinus tachycardia, tachypnea likely reactive to this process -no PE on CT  Abnormal EF (40-45%), no prior. Unclear cardiomyopathy. -Elevated troponins this admission, but denies any chest discomfort. No prior cardiac evaluation or history. -CT with patchy infiltrates, less common for heart failure and more common for infection -see extensive discussion re: cath yesterday. She is amenable and wants to know if there is CAD, but we both agreed that with minimal symptoms a high risk intervention  is unlikely to give long term benefit. She would prefer medical management in that case. The goal is to further evaluate her cardiomyopathy. Given her active lung process (likely infectious), right heart cath low yield at this time, so will pursue coronary angiography to determine if there is an ischemic component to her cardiomyopathy. She   is open to straightforward PCI (though still understands this carries some risk) if it would help her heart pump function potentially.  Moderate to severe mitral regurgitation with rheumatic appearing mitral valve, without severe mitral stenosis -thickened leaflets with rheumatic movement -MVA >3 based on pressure half time -no indication for intervention at this time.  Acute kidney injury -peak Cr 1.52 on admission, today 1.24 -K 3.4, supplemented this AM  For questions or updates, please contact [facility] Please consult [email or web address] for contact info under     Signed, Sharbel Sahagun, MD  03/23/2020, 8:37 AM   

## 2020-03-23 NOTE — Plan of Care (Signed)

## 2020-03-23 NOTE — Plan of Care (Signed)
  Problem: Education: Goal: Knowledge of General Education information will improve Description: Including pain rating scale, medication(s)/side effects and non-pharmacologic comfort measures Outcome: Progressing   Problem: Health Behavior/Discharge Planning: Goal: Ability to manage health-related needs will improve Outcome: Progressing   Problem: Clinical Measurements: Goal: Will remain free from infection Outcome: Progressing Goal: Respiratory complications will improve Outcome: Progressing   Problem: Activity: Goal: Risk for activity intolerance will decrease Outcome: Progressing   Problem: Coping: Goal: Level of anxiety will decrease Outcome: Progressing   

## 2020-03-23 NOTE — Progress Notes (Signed)
PROGRESS NOTE    Anita [name]  [id number] DOB: March 09, 1961 DOA: 03/20/2020 PCP: [name], NP   Brief Narrative:  Anita [name] is a 59 y.o. WF PMHx Multiple Sclerosis with Optic neuritis, RIGHT ear hearing loss, migraine headache, obese, GERD, arthritis, tobacco abuse, and allergies   Presents with complaints of shortness of breath.  Reports having intermittent episodes of shortness of breath lasting approximately 2 minutes before self resolving over the last 3-4 days.  During episode she would have palpitations, diaphoresis, and feel anxious.  Symptoms would gradually resolve on their own.  Over this weekend she also has reported having diarrhea 3-4 episodes per day, nausea, and mostly nonproductive cough.  Diarrhea was noted to be yellowish-orange in she did not notice any blood.  She had tried taking Mucinex, but thought that it may have been causing her to have diarrhea.  Denied having any recent sick contacts or knowledge of chest pain, abdominal pain, leg swelling, weight gain, dysuria, or vomiting,.  However, last night while trying to go to bed developed severe shortness of breath which did not go away. Laying seem to worsen symptoms.   Reports smoking half a pack cigarettes per day on average.  She has been off of treatment for MS for at least 10 years now because she felt that they did not help.  Ambulates with difficulty using a walker.  She has been muscle spasms and utilizes Tylenol to treat symptoms.  Patient notes family history significant for mother who had a heart attack at age 81.   ED Course: Upon admission into the emergency department patient was seen to be afebrile with pulse 113-153, respirations 23-30, blood pressures maintained, and O2 saturations 93-98% on room air.  Labs significant for WBC 8.1, hemoglobin 10.3, CO2 16, BUN 21, creatinine 1.3, glucose 195, anion gap 18, troponin 2581-> 2513, and BNP 1255.6.  Chest x-ray showed diffuse  interstitial process concerning for atypical pneumonia versus edema.  Patient had been given heparin bolus of 4000 units, Rocephin azithromycin, and 1 L normal saline IV fluids.  Cardiology has been consulted due to the elevated troponin.  TRH called to admit.   Subjective: 4/16    afebrile overnight, A/O x4, negative CP, positive S OB but improving.  Positive diarrhea but improving.  Assessment & Plan:   Principal Problem:   Acute respiratory distress Active Problems:   TOBACCO ABUSE   Multiple sclerosis (HCC)   Acute respiratory failure with hypoxia (HCC)   Prolonged QT interval   NSTEMI (non-ST elevated myocardial infarction) (HCC)   SIRS (systemic inflammatory response syndrome) (HCC)   Normocytic anemia   Pneumonia   AKI (acute kidney injury) ([place])   Respiratory distress   Atypical pneumonia   Tobacco abuse   Elevated troponin   Acute systolic CHF (congestive heart failure) (HCC)   Pulmonary hypertension (HCC)   Retroperitoneal mass   Insomnia  Acute respiratory failure with hypoxia -Xopenex QID -Flutter valve -Incentive spirometer -Complete 7-day course antibiotics.  Azithromycin discontinued secondary to prolonged QT interval. -Discussed case with pharmacy and patient has been on high dose of steroids in the past for her MS.  Solu-Medrol 60 mg daily.  Short course. -4/16 Pulmicort nebulizer BID -4/17 PCXR pending -Continuous pulse ox -Titrate O2 to maintain SPO2> 88%  Atypical pneumonia -Patient CT angiogram 4/13 consistent with viral/atypical pneumonia see results below -See acute respiratory distress -All viral panels negative see results below   SIRS:  -Initially presented tachycardia, tachypnea, WBC were  WNL. -Most likely respiratory cause. -Blood cultures NGTD   Tobacco abuse -Patient was counseled on need to absolutely discontinue smoking forever.  Had been smoking 1/2 PPD since 59 years old -Nicotine patch  Elevated troponin -Initially thought to  be demand ischemia however echocardiogram shows acute systolic CHF and acute pulmonary HTN. -4/14 cardiology notified concerning findings awaiting recommendations on if cardiac catheterization to occur. -Cardiology recommends [facility] this admission but would like her to be fever free at least 48 hours prior to procedure. -We will monitor patient's symptoms to ensure she meets cardiology parameters for cardiac catheterization. -4/15 patient has been fever free for 48 hours, normal WBC.  Should meet criteria for cardiology to perform cardiac catheterization.  Acute systolic CHF/NSTEMI -EF 40 to 45% see echocardiogram results below -Strict in and out +683.10ml -Daily weight -See elevated troponin -Scheduled for cardiac catheterization 4/16 -4/16 Coreg 3.125 mg BID -We will also start patient on ACE I if BP tolerates Coreg.  Acute pulmonary hypertension -See CHF/elevated troponin  Acute kidney injury vs CKD -Previous creatinine= 1.08 on 01/03/2011 -Most likely patient's renal function has been worsening over the years and she has not known this. Recent Labs  Lab 03/20/20 0232 03/20/20 1701 03/21/20 0645 03/22/20 0233 03/23/20 0218  CREATININE 1.53* 1.48* 1.29* 1.21* 1.24*  -Gently hydrate normal saline 28ml/hr  Prolonged QT interval: Acute.  QTc initially 586. -Correct any electrolyte abnormalities -Avoid any QT prolonging medication -Recheck EKG in a.m.   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 -After resolution of cardiac issues will consult surgery and address.    Metabolic acidosis with elevated anion gap -4/14 resolved  Multiple sclerosis:  -Patient reports that she has been off of any kind of treatment over the last 10 years as she felt they were having no effect.  Patient reports that she has been using Tylenol and ibuprofen. -Check acetaminophen and salicylate level -Robaxin as needed for muscle spasms   Normocytic  anemia: Acute.  Hemoglobin 10.3 g/dL on admission, but previously noted to be within normal limits. -Recheck H&H -Transfuse for hemoglobin<7  Diarrhea  -C. difficile pending -Stool culture pending  Insomnia -Trazodone 50 mg QHS PRN  Hypokalemia --K-Dur 71meq      DVT prophylaxis: Subcu heparin Code Status:  Family Communication: 4/16 spoke with [name] (daughter) counseled on plan of care answered all questions Disposition Plan:  1.  Where the patient is from 2.  Anticipated d/c place. 3.  Barriers to d/c per cardiology   Consultants:  Cardiology    Procedures/Significant Events:  4/13 Echocardiogram;Left Ventricle: LVEF=40 -45%.-Global hypokinesis.  -Left ventricular diastolic function could not be evaluated due to mitral stenosis.  Right Ventricle:  moderately elevated pulmonary artery systolic pressure.  The tricuspid regurgitant velocity is 3.30 m/s, and with an assumed right atrial pressure of 15 mmHg, the estimated right ventricular systolic pressure is 0000000 mmHg.  Mitral Valve: Rheumatic. There is moderate thickening of the mitral valve leaflet(s). Moderate mitral subvalvular thickening/fibrosis. Moderate to severe mitral valve regurgitation, with  posteriorly-directed jet. Mild mitral valve stenosis. The mean mitral valve gradient is 7.3 mmHg with average heart  rate of 105 bpm.  Venous: The inferior vena cava is dilated in size with less than 50% respiratory variability, suggesting right atrial pressure of 15 mmHg.  4/14 CTA chest PE protocol;-ground-glass opacities with history favoring atypical/viral pneumonia. -Small pleural effusions and interlobular septal thickening not typically associated with viral pneumonia, findings could reflect atypical edema or failure superimposed  on infection. -Left retroperitoneal mass, ganglioneuroma at 2007 pathology.There is residual or recurrent mass adjacent to the retroperitoneal clips, 5.6 cm. Was the patient is prior  resection subtotal? 4/16 cardiac catheterization;  Hemodynamics: LV end diastolic pressure is moderately elevated.  ----Coronary Angiography-----  Ost RCA to Dist RCA lesion is 100% stenosed.-The PDA and PL system fills via faint collaterals from the AV groove LCx and LAD septals.  Mid LAD-1 lesion is 60% stenosed. Mid LAD-2 lesion is 50% stenosed. Dist LAD lesion is 55% stenosed with 70% stenosed side branch in 2nd Diag.  1st Diag lesion is 70% stenosed.  Ost Cx to Prox Cx lesion is 45% stenosed. Prox-MID Cx lesion is 65% stenosed.    MODERATE-SEVERE THREE-VESSEL CAD: ? 100% proximal RCA occlusion with left-to-right collaterals faintly filling PDA and PL system (AV groove LCx-PL and LAD septal-PDA) ? Diffuse moderate mid LAD disease with 60% to 50% stenosis. ? Tandem 50% and 65% proximal and mid LCx with the 65% lesion being the most significant lesion.  Moderately elevated LVEDP of 18-20 mmHg  Given the extent of disease in the LAD and LCx, neither lesion is very amenable to PCI, and RCA is chronically occluded.  Given her comorbidities, I do not think she would be a good CABG candidate especially in light of the fact that she would likely require valve surgery as well.  She would not recover well.  Recommendations aggressive medical management.   I have personally reviewed and interpreted all radiology studies and my findings are as above.  VENTILATOR SETTINGS:    Cultures 4/13 SARS coronavirus negative 4/13 influenza A/B negative 4/13 respiratory virus panel negative 4/13 HIV nonreactive 4/13 blood right forearm NGTD 4/13 blood left arm NGTD 4/14 C. difficile antigen/toxin negative 4/14 GI panel pending     Antimicrobials: Anti-infectives (From admission, onward)   Start     Stop   03/21/20 0630  cefTRIAXone (ROCEPHIN) 1 g in sodium chloride 0.9 % 100 mL IVPB         03/20/20 1430  doxycycline (VIBRA-TABS) tablet 100 mg         03/20/20 0545  cefTRIAXone  (ROCEPHIN) 1 g in sodium chloride 0.9 % 100 mL IVPB     03/20/20 0730   03/20/20 0545  azithromycin (ZITHROMAX) tablet 500 mg     03/20/20 [id number]       Devices    LINES / TUBES:      Continuous Infusions:  sodium chloride Stopped (03/22/20 0543)   sodium chloride 50 mL/hr at 03/23/20 0546   sodium chloride     sodium chloride     cefTRIAXone (ROCEPHIN)  IV 1 g (03/23/20 0547)     Objective: Vitals:   03/22/20 2359 03/23/20 0000 03/23/20 0321 03/23/20 0500  BP: 120/81 120/81 124/83   Pulse: (!) 118  (!) 110   Resp: (!) 27 20 (!) 25   Temp: 98.6 F (37 C) 98.6 F (37 C) 98 F (36.7 C)   TempSrc: Oral Oral Oral   SpO2: 97% 97% 98%   Weight:    67.9 kg  Height:        Intake/Output Summary (Last 24 hours) at 03/23/2020 0750 Last data filed at 03/23/2020 0321 Gross per 24 hour  Intake 1003.91 ml  Output 900 ml  Net 103.91 ml   Filed Weights   03/21/20 0243 03/22/20 0540 03/23/20 0500  Weight: 68.7 kg 67.2 kg 67.9 kg   Physical Exam:  General: A/O x4, positive  acute  respiratory distress Eyes: negative scleral hemorrhage, negative anisocoria, negative icterus ENT: Negative Runny nose, negative gingival bleeding, Neck:  Negative scars, masses, torticollis, lymphadenopathy, JVD Lungs: Tachypnea, diffuse expiratory wheeze, negative crackles  Cardiovascular: Tachycardic, without murmur gallop or rub normal S1 and S2 Abdomen: negative abdominal pain, nondistended, positive soft, bowel sounds, no rebound, no ascites, no appreciable mass Extremities: No significant cyanosis, clubbing, or edema bilateral lower extremities Skin: Negative rashes, lesions, ulcers Psychiatric:  Negative depression, positive anxiety, negative fatigue, negative mania  Central nervous system:  Cranial nerves II through XII intact, tongue/uvula midline, all extremities muscle strength 5/5, sensation intact throughout, negative dysarthria, negative expressive aphasia, negative receptive  aphasia.      Data Reviewed: Care during the described time interval was provided by me .  I have reviewed this patient's available data, including medical history, events of note, physical examination, and all test results as part of my evaluation.   CBC: Recent Labs  Lab 03/20/20 0232 03/20/20 1701 03/21/20 0645 03/22/20 0233 03/23/20 0218  WBC 8.1  --  8.6 8.9 7.1  HGB 10.3* 9.2* 9.2* 8.7* 8.5*  HCT 33.3* 28.9* 28.6* 27.3* 26.6*  MCV 95.4  --  93.8 93.8 93.7  PLT 363  --  306 294 [id number]   Basic Metabolic Panel: Recent Labs  Lab 03/20/20 0232 03/20/20 0743 03/20/20 1701 03/21/20 0645 03/22/20 0233 03/23/20 0218  NA 136  --  134* 138 137 137  K 4.0  --  3.2* 3.8 3.9 3.4*  CL 102  --  105 106 105 106  CO2 16*  --  16* 19* 19* 18*  GLUCOSE 185*  --  115* 107* 106* 114*  BUN 21*  --  19 20 18 17   CREATININE 1.53*  --  1.48* 1.29* 1.21* 1.24*  CALCIUM 9.1  --  8.3* 8.5* 8.7* 8.3*  MG  --  1.8  --  2.0 2.1 2.2  PHOS  --   --   --   --  3.6 3.3   GFR: Estimated Creatinine Clearance: 46.3 mL/min (A) (by C-G formula based on SCr of 1.24 mg/dL (H)). Liver Function Tests: Recent Labs  Lab 03/20/20 0826 03/22/20 0233 03/23/20 0218  AST 18 30 57*  ALT 11 17 39  ALKPHOS 56 55 64  BILITOT 0.7 0.7 0.4  PROT 6.2* 6.3* 6.1*  ALBUMIN 3.1* 2.8* 2.7*   No results for input(s): LIPASE, AMYLASE in the last 168 hours. No results for input(s): AMMONIA in the last 168 hours. Coagulation Profile: No results for input(s): INR, PROTIME in the last 168 hours. Cardiac Enzymes: Recent Labs  Lab 03/20/20 0810  CKTOTAL 205   BNP (last 3 results) No results for input(s): PROBNP in the last 8760 hours. HbA1C: No results for input(s): HGBA1C in the last 72 hours. CBG: No results for input(s): GLUCAP in the last 168 hours. Lipid Profile: No results for input(s): CHOL, HDL, LDLCALC, TRIG, CHOLHDL, LDLDIRECT in the last 72 hours. Thyroid Function Tests: No results for input(s): TSH,  T4TOTAL, FREET4, T3FREE, THYROIDAB in the last 72 hours. Anemia Panel: No results for input(s): VITAMINB12, FOLATE, FERRITIN, TIBC, IRON, RETICCTPCT in the last 72 hours. Urine analysis:    Component Value Date/Time   COLORURINE YELLOW 03/20/2020 1600   APPEARANCEUR CLEAR 03/20/2020 1600   LABSPEC 1.026 03/20/2020 1600   PHURINE 5.0 03/20/2020 1600   GLUCOSEU NEGATIVE 03/20/2020 1600   HGBUR MODERATE (A) 03/20/2020 1600   BILIRUBINUR NEGATIVE 03/20/2020 1600   KETONESUR NEGATIVE 03/20/2020  [place] 03/20/2020 1600   NITRITE NEGATIVE 03/20/2020 1600   LEUKOCYTESUR SMALL (A) 03/20/2020 1600   Sepsis Labs: @LABRCNTIP (procalcitonin:4,lacticidven:4)  ) Recent Results (from the past 240 hour(s))  Respiratory Panel by RT PCR (Flu A&B, Covid) -     Status: None   Collection Time: 03/20/20  5:54 AM  Result Value Ref Range Status   SARS Coronavirus 2 by RT PCR NEGATIVE NEGATIVE Final    Comment: (NOTE) SARS-CoV-2 target nucleic acids are NOT DETECTED. The SARS-CoV-2 RNA is generally detectable in upper respiratoy specimens during the acute phase of infection. The lowest concentration of SARS-CoV-2 viral copies this assay can detect is 131 copies/mL. A negative result does not preclude SARS-Cov-2 infection and should not be used as the sole basis for treatment or other patient management decisions. A negative result may occur with  improper specimen collection/handling, submission of specimen other than nasopharyngeal swab, presence of viral mutation(s) within the areas targeted by this assay, and inadequate number of viral copies (<131 copies/mL). A negative result must be combined with clinical observations, patient history, and epidemiological information. The expected result is Negative. Fact Sheet for Patients:  PinkCheek.be Fact Sheet for Healthcare Providers:  GravelBags.it This test is not yet ap  proved or cleared by the [place] FDA and  has been authorized for detection and/or diagnosis of SARS-CoV-2 by FDA under an Emergency Use Authorization (EUA). This EUA will remain  in effect (meaning this test can be used) for the duration of the COVID-19 declaration under Section 564(b)(1) of the Act, 21 U.S.C. section 360bbb-3(b)(1), unless the authorization is terminated or revoked sooner.    Influenza A by PCR NEGATIVE NEGATIVE Final   Influenza B by PCR NEGATIVE NEGATIVE Final    Comment: (NOTE) The Xpert Xpress SARS-CoV-2/FLU/RSV assay is intended as an aid in  the diagnosis of influenza from Nasopharyngeal swab specimens and  should not be used as a sole basis for treatment. Nasal washings and  aspirates are unacceptable for Xpert Xpress SARS-CoV-2/FLU/RSV  testing. Fact Sheet for Patients: PinkCheek.be Fact Sheet for Healthcare Providers: GravelBags.it This test is not yet approved or cleared by the [place] FDA and  has been authorized for detection and/or diagnosis of SARS-CoV-2 by  FDA under an Emergency Use Authorization (EUA). This EUA will remain  in effect (meaning this test can be used) for the duration of the  Covid-19 declaration under Section 564(b)(1) of the Act, 21  U.S.C. section 360bbb-3(b)(1), unless the authorization is  terminated or revoked. Performed at [facility], [place] [address], [place], [place] [address]   Respiratory Panel by PCR     Status: None   Collection Time: 03/20/20  5:54 AM   Specimen: Nasopharyngeal Swab; Respiratory  Result Value Ref Range Status   Adenovirus NOT DETECTED NOT DETECTED Final   Coronavirus 229E NOT DETECTED NOT DETECTED Final    Comment: (NOTE) The Coronavirus on the Respiratory Panel, DOES NOT test for the novel  Coronavirus (2019 nCoV)    Coronavirus HKU1 NOT DETECTED NOT DETECTED Final   Coronavirus NL63 NOT DETECTED NOT DETECTED Final    Coronavirus OC43 NOT DETECTED NOT DETECTED Final   Metapneumovirus NOT DETECTED NOT DETECTED Final   Rhinovirus / Enterovirus NOT DETECTED NOT DETECTED Final   Influenza A NOT DETECTED NOT DETECTED Final   Influenza B NOT DETECTED NOT DETECTED Final   Parainfluenza Virus 1 NOT DETECTED NOT DETECTED Final   Parainfluenza Virus 2 NOT DETECTED NOT  DETECTED Final   Parainfluenza Virus 3 NOT DETECTED NOT DETECTED Final   Parainfluenza Virus 4 NOT DETECTED NOT DETECTED Final   Respiratory Syncytial Virus NOT DETECTED NOT DETECTED Final   Bordetella pertussis NOT DETECTED NOT DETECTED Final   Chlamydophila pneumoniae NOT DETECTED NOT DETECTED Final   Mycoplasma pneumoniae NOT DETECTED NOT DETECTED Final    Comment: Performed at [facility], [place] [address], [place], [place] [address]  Blood Culture (routine x 2)     Status: None (Preliminary result)   Collection Time: 03/20/20  7:31 AM   Specimen: BLOOD RIGHT FOREARM  Result Value Ref Range Status   Specimen Description BLOOD RIGHT FOREARM  Final   Special Requests   Final    BOTTLES DRAWN AEROBIC AND ANAEROBIC Blood Culture adequate volume   Culture   Final    NO GROWTH 2 DAYS Performed at [facility], [place] [address], [place], [place] [address]    Report Status PENDING  Incomplete  Blood Culture (routine x 2)     Status: None (Preliminary result)   Collection Time: 03/20/20  8:06 AM   Specimen: BLOOD LEFT ARM  Result Value Ref Range Status   Specimen Description BLOOD LEFT ARM  Final   Special Requests   Final    BOTTLES DRAWN AEROBIC AND ANAEROBIC Blood Culture adequate volume   Culture   Final    NO GROWTH 2 DAYS Performed at [facility], [place] [address] [place], [place] [address]    Report Status PENDING  Incomplete  MRSA PCR Screening     Status: None   Collection Time: 03/20/20  3:52 PM   Specimen: Nasopharyngeal  Result Value Ref Range Status   MRSA by PCR NEGATIVE NEGATIVE Final    Comment:        The  GeneXpert MRSA Assay (FDA approved for NASAL specimens only), is one component of a comprehensive MRSA colonization surveillance program. It is not intended to diagnose MRSA infection nor to guide or monitor treatment for MRSA infections. Performed at [facility], [place] [address], [place], [place] [address]   Gastrointestinal Panel by PCR , Stool     Status: None   Collection Time: 03/21/20  1:50 PM   Specimen: STOOL  Result Value Ref Range Status   Campylobacter species NOT DETECTED NOT DETECTED Final   Plesimonas shigelloides NOT DETECTED NOT DETECTED Final   Salmonella species NOT DETECTED NOT DETECTED Final   Yersinia enterocolitica NOT DETECTED NOT DETECTED Final   Vibrio species NOT DETECTED NOT DETECTED Final   Vibrio cholerae NOT DETECTED NOT DETECTED Final   Enteroaggregative E coli (EAEC) NOT DETECTED NOT DETECTED Final   Enteropathogenic E coli (EPEC) NOT DETECTED NOT DETECTED Final   Enterotoxigenic E coli (ETEC) NOT DETECTED NOT DETECTED Final   Shiga like toxin producing E coli (STEC) NOT DETECTED NOT DETECTED Final   Shigella/Enteroinvasive E coli (EIEC) NOT DETECTED NOT DETECTED Final   Cryptosporidium NOT DETECTED NOT DETECTED Final   Cyclospora cayetanensis NOT DETECTED NOT DETECTED Final   Entamoeba histolytica NOT DETECTED NOT DETECTED Final   Giardia lamblia NOT DETECTED NOT DETECTED Final   Adenovirus F40/41 NOT DETECTED NOT DETECTED Final   Astrovirus NOT DETECTED NOT DETECTED Final   Norovirus GI/GII NOT DETECTED NOT DETECTED Final   Rotavirus A NOT DETECTED NOT DETECTED Final   Sapovirus (I, II, IV, and V) NOT DETECTED NOT DETECTED Final    Comment: Performed at [facility]  [facility], [place], [place] [address]  C Difficile Quick Screen w PCR reflex     Status: None   Collection Time: 03/21/20  1:50 PM   Specimen: STOOL  Result Value Ref Range Status   C Diff antigen NEGATIVE NEGATIVE Final   C Diff toxin NEGATIVE NEGATIVE Final    C Diff interpretation No C. difficile detected.  Final    Comment: Performed at [facility], [place] [address] [place], [place] [address]         Radiology Studies: No results found.      Scheduled Meds:  doxycycline  100 mg Oral Q12H   heparin injection (subcutaneous)  5,000 Units Subcutaneous Q8H   methylPREDNISolone (SOLU-MEDROL) injection  60 mg Intravenous Q24H   nicotine  21 mg Transdermal Daily   potassium chloride  40 mEq Oral Once   sodium chloride flush  3 mL Intravenous Q12H   sodium chloride flush  3 mL Intravenous Q12H   Continuous Infusions:  sodium chloride Stopped (03/22/20 0543)   sodium chloride 50 mL/hr at 03/23/20 0546   sodium chloride     sodium chloride     cefTRIAXone (ROCEPHIN)  IV 1 g (03/23/20 0547)     LOS: 3 days   The patient is critically ill with multiple organ systems failure and requires high complexity decision making for assessment and support, frequent evaluation and titration of therapies, application of advanced monitoring technologies and extensive interpretation of multiple databases. Critical Care Time devoted to patient care services described in this note  Time spent: 40 minutes     Kory Panjwani, Geraldo Docker, MD [facility] Pager [phone]  If 7PM-7AM, please contact night-coverage [email or web address] Password [email or web address] 03/23/2020, 7:50 AM

## 2020-03-24 ENCOUNTER — Inpatient Hospital Stay (HOSPITAL_COMMUNITY): Payer: Medicare Other

## 2020-03-24 DIAGNOSIS — J189 Pneumonia, unspecified organism: Secondary | ICD-10-CM | POA: Diagnosis not present

## 2020-03-24 DIAGNOSIS — R778 Other specified abnormalities of plasma proteins: Secondary | ICD-10-CM | POA: Diagnosis not present

## 2020-03-24 DIAGNOSIS — I255 Ischemic cardiomyopathy: Secondary | ICD-10-CM

## 2020-03-24 DIAGNOSIS — I2510 Atherosclerotic heart disease of native coronary artery without angina pectoris: Secondary | ICD-10-CM | POA: Diagnosis not present

## 2020-03-24 LAB — COMPREHENSIVE METABOLIC PANEL
ALT: 41 U/L (ref 0–44)
AST: 41 U/L (ref 15–41)
Albumin: 2.7 g/dL — ABNORMAL LOW (ref 3.5–5.0)
Alkaline Phosphatase: 68 U/L (ref 38–126)
Anion gap: 12 (ref 5–15)
BUN: 24 mg/dL — ABNORMAL HIGH (ref 6–20)
CO2: 16 mmol/L — ABNORMAL LOW (ref 22–32)
Calcium: 9 mg/dL (ref 8.9–10.3)
Chloride: 110 mmol/L (ref 98–111)
Creatinine, Ser: 1.25 mg/dL — ABNORMAL HIGH (ref 0.44–1.00)
GFR calc Af Amer: 55 mL/min — ABNORMAL LOW (ref >60)
GFR calc non Af Amer: 48 mL/min — ABNORMAL LOW (ref >60)
Glucose, Bld: 133 mg/dL — ABNORMAL HIGH (ref 70–99)
Potassium: 4.4 mmol/L (ref 3.5–5.1)
Sodium: 138 mmol/L (ref 135–145)
Total Bilirubin: 0.5 mg/dL (ref 0.3–1.2)
Total Protein: 6.6 g/dL (ref 6.5–8.1)

## 2020-03-24 LAB — MAGNESIUM: Magnesium: 2.1 mg/dL (ref 1.7–2.4)

## 2020-03-24 LAB — CBC
HCT: 26 % — ABNORMAL LOW (ref 36.0–46.0)
Hemoglobin: 8.3 g/dL — ABNORMAL LOW (ref 12.0–15.0)
MCH: 30 pg (ref 26.0–34.0)
MCHC: 31.9 g/dL (ref 30.0–36.0)
MCV: 93.9 fL (ref 80.0–100.0)
Platelets: 357 10*3/uL (ref 150–400)
RBC: 2.77 MIL/uL — ABNORMAL LOW (ref 3.87–5.11)
RDW: 15.5 % (ref 11.5–15.5)
WBC: 12.5 10*3/uL — ABNORMAL HIGH (ref 4.0–10.5)
nRBC: 0 % (ref 0.0–0.2)

## 2020-03-24 LAB — PHOSPHORUS: Phosphorus: 4.1 mg/dL (ref 2.5–4.6)

## 2020-03-24 MED ORDER — ROSUVASTATIN CALCIUM 20 MG PO TABS
20.0000 mg | ORAL_TABLET | Freq: Every day | ORAL | Status: DC
  Administered 2020-03-24 – 2020-03-28 (×5): 20 mg via ORAL
  Filled 2020-03-24 (×5): qty 1

## 2020-03-24 MED ORDER — ASPIRIN EC 81 MG PO TBEC
81.0000 mg | DELAYED_RELEASE_TABLET | Freq: Every day | ORAL | Status: DC
  Administered 2020-03-24 – 2020-03-28 (×5): 81 mg via ORAL
  Filled 2020-03-24 (×5): qty 1

## 2020-03-24 MED ORDER — LEVALBUTEROL HCL 1.25 MG/0.5ML IN NEBU: 1.2500 mg | INHALATION_SOLUTION | Freq: Four times a day (QID) | RESPIRATORY_TRACT | Status: DC

## 2020-03-24 MED ORDER — LEVALBUTEROL HCL 1.25 MG/0.5ML IN NEBU
1.2500 mg | INHALATION_SOLUTION | Freq: Four times a day (QID) | RESPIRATORY_TRACT | Status: DC
  Administered 2020-03-24 (×2): 1.25 mg via RESPIRATORY_TRACT
  Filled 2020-03-24 (×2): qty 0.5

## 2020-03-24 MED ORDER — ALPRAZOLAM 0.25 MG PO TABS
0.2500 mg | ORAL_TABLET | Freq: Once | ORAL | Status: AC
  Administered 2020-03-24: 04:00:00 0.25 mg via ORAL
  Filled 2020-03-24: qty 1

## 2020-03-24 MED ORDER — LOSARTAN POTASSIUM 25 MG PO TABS
25.0000 mg | ORAL_TABLET | Freq: Every day | ORAL | Status: DC
  Administered 2020-03-24 – 2020-03-25 (×2): 25 mg via ORAL
  Filled 2020-03-24 (×2): qty 1

## 2020-03-24 NOTE — Plan of Care (Signed)

## 2020-03-24 NOTE — Progress Notes (Signed)
Progress Note  Patient Name: Anita [name] Date of Encounter: 03/24/2020  Primary Cardiologist: Buford Dresser, MD   Subjective   I saw her late yesterday and reviewed the cath, and we reviewed again this AM. She has diffuse moderate CAD in the left system and a chronically occluded right that fills via collaterals. This is not acute in appearance. I watched her cath, and her initial LVEDP appeared normal to me, and then repeat was mildly elevated.   We discussed this at length today. Her CAD is not acute, but it is likely she has not had strenuous enough activity to be symptomatic prior. We discussed medical management for this, and she is amenable. She remains very short of breath, which does not appear to match her cardiac testing.   She is frustrated, hasn't been sleeping, had an episode yesterday when she felt very stressed, started breathing rapidly, felt like her heart was pounding. She received a Xanax and this helped. Overall she is frustrated and wants to do whatever needs done to leave the hospital.  She is bringing up more sputum, and today it has small streaks of blood. She showed me thick beige mucus. Coughing frequently.   Inpatient Medications    Scheduled Meds: . aspirin EC  81 mg Oral Daily  . budesonide (PULMICORT) nebulizer solution  0.25 mg Nebulization BID  . carvedilol  3.125 mg Oral BID WC  . doxycycline  100 mg Oral Q12H  . heparin  5,000 Units Subcutaneous Q8H  . losartan  25 mg Oral Daily  . methylPREDNISolone (SOLU-MEDROL) injection  60 mg Intravenous Q24H  . nicotine  21 mg Transdermal Daily  . rosuvastatin  20 mg Oral Daily  . sodium chloride flush  3 mL Intravenous Q12H  . sodium chloride flush  3 mL Intravenous Q12H  . sodium chloride flush  3 mL Intravenous Q12H   Continuous Infusions: . sodium chloride    . cefTRIAXone (ROCEPHIN)  IV 1 g (03/24/20 0607)   PRN Meds: sodium chloride, acetaminophen **OR** acetaminophen,  diphenhydrAMINE, levalbuterol, methocarbamol, sodium chloride flush, traZODone   Vital Signs    Vitals:   03/24/20 0559 03/24/20 0749 03/24/20 0753 03/24/20 0755  BP:  (!) 122/92    Pulse:   (!) 104 (!) 105  Resp:  (!) 22 (!) 22 18  Temp:  98.1 F (36.7 C)  (!) 97.5 F (36.4 C)  TempSrc:  Oral  Oral  SpO2:   95% 95%  Weight: 70.6 kg     Height:        Intake/Output Summary (Last 24 hours) at 03/24/2020 0901 Last data filed at 03/23/2020 1600 Gross per 24 hour  Intake 764.17 ml  Output --  Net 764.17 ml   Last 3 Weights 03/24/2020 03/23/2020 03/22/2020  Weight (lbs) 155 lb 10.3 oz 149 lb 11.1 oz 148 lb 2.4 oz  Weight (kg) 70.6 kg 67.9 kg 67.2 kg      Telemetry    Sinus tachycardia, 100s-110s - Personally Reviewed  ECG    4/15 Sinus tachycardia - Personally Reviewed  Physical Exam   GEN: in no acute distress HEENT: Normal, moist mucous membranes NECK: No JVD CARDIAC: tachycardic regular rhythm, normal S1 and S2, no rubs or gallops. 2/6 HS murmur. VASCULAR: Radial pulses 2+ bilaterally. Cath site c/d/i RESPIRATORY:  Diffusely coarse ABDOMEN: Soft, non-tender, non-distended MUSCULOSKELETAL:  Moves all 4 limbs independently SKIN: Warm and dry, no edema NEUROLOGIC:  Alert and oriented x 3. No focal neuro deficits  noted. PSYCHIATRIC:  Normal affect   Labs    High Sensitivity Troponin:   Recent Labs  Lab 03/20/20 0232 03/20/20 0441  TROPONINIHS 2,581* 2,513*      Chemistry Recent Labs  Lab 03/22/20 0233 03/23/20 0218 03/24/20 0346  NA 137 137 138  K 3.9 3.4* 4.4  CL 105 106 110  CO2 19* 18* 16*  GLUCOSE 106* 114* 133*  BUN 18 17 24*  CREATININE 1.21* 1.24* 1.25*  CALCIUM 8.7* 8.3* 9.0  PROT 6.3* 6.1* 6.6  ALBUMIN 2.8* 2.7* 2.7*  AST 30 57* 41  ALT 17 39 41  ALKPHOS 55 64 68  BILITOT 0.7 0.4 0.5  GFRNONAA 50* 48* 48*  GFRAA 58* 56* 55*  ANIONGAP _0 Hematology Recent Labs  Lab 03/22/20 0233 03/23/20 0218 03/24/20 0346  WBC 8.9  7.1 12.5*  RBC 2.91* 2.84* 2.77*  HGB 8.7* 8.5* 8.3*  HCT 27.3* 26.6* 26.0*  MCV 93.8 93.7 93.9  MCH 29.9 29.9 30.0  MCHC 31.9 32.0 31.9  RDW 15.3 15.2 15.5  PLT 294 285 357    BNP Recent Labs  Lab 03/20/20 0441  BNP 1,255.6*     DDimer No results for input(s): DDIMER in the last 168 hours.   Radiology    CARDIAC CATHETERIZATION  Result Date: 03/23/2020  Hemodynamics: LV end diastolic pressure is moderately elevated.  ----Coronary Angiography-----  Ost RCA to Dist RCA lesion is 100% stenosed.-The PDA and PL system fills via faint collaterals from the AV groove LCx and LAD septals.  Mid LAD-1 lesion is 60% stenosed. Mid LAD-2 lesion is 50% stenosed. Dist LAD lesion is 55% stenosed with 70% stenosed side branch in 2nd Diag.  1st Diag lesion is 70% stenosed.  Ost Cx to Prox Cx lesion is 45% stenosed. Prox-MID Cx lesion is 65% stenosed.   MODERATE-SEVERE THREE-VESSEL CAD:  100% proximal RCA occlusion with left-to-right collaterals faintly filling PDA and PL system (AV groove LCx-PL and LAD septal-PDA)  Diffuse moderate mid LAD disease with 60% to 50% stenosis.  Tandem 50% and 65% proximal and mid LCx with the 65% lesion being the most significant lesion.  Moderately elevated LVEDP of 18-20 mmHg Given the extent of disease in the LAD and LCx, neither lesion is very amenable to PCI, and RCA is chronically occluded.  Given her comorbidities, I do not think she would be a good CABG candidate especially in light of the fact that she would likely require valve surgery as well.  She would not recover well. Recommendations aggressive medical management. [name], MD   Cardiac Studies   Echo 03/20/20 1. Left ventricular ejection fraction, by estimation, is 40 to 45%. The  left ventricle has mildly decreased function. The left ventricle  demonstrates global hypokinesis. The left ventricular internal cavity size  was mildly dilated. Left ventricular  diastolic function could not be  evaluated.  2. Right ventricular systolic function is normal. The right ventricular  size is normal. There is moderately elevated pulmonary artery systolic  pressure.  3. Left atrial size was mildly dilated.  4. Moderate mitral subvalvular thickening/fibrosis.  5. The mitral valve is rheumatic. Moderate to severe mitral valve  regurgitation. Mild mitral stenosis. The mean mitral valve gradient is 7.3  mmHg with average heart rate of 105 bpm.  6. The aortic valve is normal in structure. Aortic valve regurgitation is  not visualized.  7. The inferior vena cava is dilated in size with <50% respiratory  variability,  suggesting right atrial pressure of 15 mmHg.  Cath 03/23/20  Hemodynamics: LV end diastolic pressure is moderately elevated.  ----Coronary Angiography-----  Ost RCA to Dist RCA lesion is 100% stenosed.-The PDA and PL system fills via faint collaterals from the AV groove LCx and LAD septals.  Mid LAD-1 lesion is 60% stenosed. Mid LAD-2 lesion is 50% stenosed. Dist LAD lesion is 55% stenosed with 70% stenosed side branch in 2nd Diag.  1st Diag lesion is 70% stenosed.  Ost Cx to Prox Cx lesion is 45% stenosed. Prox-MID Cx lesion is 65% stenosed.    MODERATE-SEVERE THREE-VESSEL CAD: ? 100% proximal RCA occlusion with left-to-right collaterals faintly filling PDA and PL system (AV groove LCx-PL and LAD septal-PDA) ? Diffuse moderate mid LAD disease with 60% to 50% stenosis. ? Tandem 50% and 65% proximal and mid LCx with the 65% lesion being the most significant lesion.  Moderately elevated LVEDP of 18-20 mmHg  Given the extent of disease in the LAD and LCx, neither lesion is very amenable to PCI, and RCA is chronically occluded.  Given her comorbidities, I do not think she would be a good CABG candidate especially in light of the fact that she would likely require valve surgery as well.  She would not recover well.  Recommendations aggressive medical  management.  Patient Profile     59 y.o. female with PMH multiple sclerosis presented with acute respiratory distress and fevers. Echo with mildly reduced EF, elevated troponins, but no chest pain. Found to have chronic diffuse moderate CAD with CTO RCA that fills by collaterals.  Assessment & Plan    Respiratory distress, pulmonary infiltrates, fever, elevated inflammatory markers: -Blood Cultures negative thus far -respiratory panel negative.  -UA mildly abnormal but culture not positive at this time.  -White count remains normal.  -ESR 64, CRP 5.9 -c diff negative, diarrhea as well, since 4/8 per her report -highly suspicious for inflammatory process -sinus tachycardia, tachypnea likely reactive to this process -no PE on CT -initial LVEDP normal, then slightly elevated on cath. Out of proportion to her symptoms -still short of breath with frequent cough, with thick beige sputum with small streaks of blood. May need to consult pulmonology given lack of clear etiology, defer to primary team  Abnormal EF (40-45%), no prior. Cath with diffuse moderate disease and CTO RCA, not acute. Suggests ischemic cardiomyopathy. -started aspirin, high intensity rosuvastatin this admission -stopped sumatriptan given CAD. She reports she has not taken in a long time -already on low dose carvedilol -will give low dose ARB starting today, as blood pressure has more room and renal function stable -PRN diuresis, no JVD or edema today  Moderate to severe mitral regurgitation with rheumatic appearing mitral valve, without severe mitral stenosis -thickened leaflets with rheumatic movement -MVA >3 based on pressure half time -no indication for intervention at this time.  Acute kidney injury, resolved -peak Cr 1.52 on admission, today 1.25  I spent >40 minutes talking with her. She is understandably frustrated and wants to go home when she is able, as she has not been sleeping/eating well in the hospital  and has had considerable stress/anxiety. She is stable from a cardiac perspective, and she has gradually improved with antibiotics. We will follow while she is admitted, and I discussed with her that timing on discharge is up to her primary team and plan for her lungs.   For questions or updates, please contact [place] Please consult [email or web address] for contact info under  Signed, Buford Dresser, MD  03/24/2020, 9:01 AM

## 2020-03-24 NOTE — Progress Notes (Signed)
Pt states she has not slept in 2 days and would like to try and rest, so a bed weight was taken instead of standing. This RN will inform day shift that pt will stand for a weight after she has rested some.

## 2020-03-24 NOTE — Progress Notes (Signed)
PROGRESS NOTE    Anita [name]  [id number] DOB: [birth year] DOA: 03/20/2020 PCP: [name], NP   Brief Narrative:  Anita [name] is a 59 y.o. WF PMHx Multiple Sclerosis with Optic neuritis, RIGHT ear hearing loss, migraine headache, obese, GERD, arthritis, tobacco abuse, and allergies   Presents with complaints of shortness of breath.  Reports having intermittent episodes of shortness of breath lasting approximately 2 minutes before self resolving over the last 3-4 days.  During episode she would have palpitations, diaphoresis, and feel anxious.  Symptoms would gradually resolve on their own.  Over this weekend she also has reported having diarrhea 3-4 episodes per day, nausea, and mostly nonproductive cough.  Diarrhea was noted to be yellowish-orange in she did not notice any blood.  She had tried taking Mucinex, but thought that it may have been causing her to have diarrhea.  Denied having any recent sick contacts or knowledge of chest pain, abdominal pain, leg swelling, weight gain, dysuria, or vomiting,.  However, last night while trying to go to bed developed severe shortness of breath which did not go away. Laying seem to worsen symptoms.   Reports smoking half a pack cigarettes per day on average.  She has been off of treatment for MS for at least 10 years now because she felt that they did not help.  Ambulates with difficulty using a walker.  She has been muscle spasms and utilizes Tylenol to treat symptoms.  Patient notes family history significant for mother who had a heart attack at age 5.   ED Course: Upon admission into the emergency department patient was seen to be afebrile with pulse 113-153, respirations 23-30, blood pressures maintained, and O2 saturations 93-98% on room air.  Labs significant for WBC 8.1, hemoglobin 10.3, CO2 16, BUN 21, creatinine 1.3, glucose 195, anion gap 18, troponin 2581-> 2513, and BNP 1255.6.  Chest x-ray showed diffuse  interstitial process concerning for atypical pneumonia versus edema.  Patient had been given heparin bolus of 4000 units, Rocephin azithromycin, and 1 L normal saline IV fluids.  Cardiology has been consulted due to the elevated troponin.  TRH called to admit.   Subjective: 4/17 A/O x4, negative CP, positive S OB especially when she is talking (SPO2 decreases into the 80s).  Assessment & Plan:   Principal Problem:   Acute respiratory distress Active Problems:   TOBACCO ABUSE   Multiple sclerosis (HCC)   Acute respiratory failure with hypoxia (HCC)   Prolonged QT interval   NSTEMI (non-ST elevated myocardial infarction) (HCC)   SIRS (systemic inflammatory response syndrome) (HCC)   Normocytic anemia   Pneumonia   AKI (acute kidney injury) ([place])   Respiratory distress   Atypical pneumonia   Tobacco abuse   Elevated troponin   Acute systolic CHF (congestive heart failure) (HCC)   Pulmonary hypertension (HCC)   Retroperitoneal mass   Insomnia  Acute respiratory failure with hypoxia -Xopenex QID -Flutter valve -Incentive spirometer -Complete 7-day course antibiotics.  Azithromycin discontinued secondary to prolonged QT interval. -Discussed case with pharmacy and patient has been on high dose of steroids in the past for her MS.  Solu-Medrol 60 mg daily.  Short course. -4/16 Pulmicort nebulizer BID -4/17 PCXR pending -Continuous pulse ox -4/17 maintain patient on 2 L O2 at all times desats whenever she talks, most likely desats when sleeping. -Titrate O2 to maintain SPO2> 94%  Atypical pneumonia -Patient CT angiogram 4/13 consistent with viral/atypical pneumonia see results below -See acute respiratory distress -  All viral panels negative see results below   SIRS:  -Initially presented tachycardia, tachypnea, WBC were WNL. -Most likely respiratory cause. -Blood cultures NGTD   Tobacco abuse -Patient was counseled on need to absolutely discontinue smoking forever.  Had been  smoking 1/2 PPD since 59 years old -Nicotine patch  Elevated troponin -Initially thought to be demand ischemia however echocardiogram shows acute systolic CHF and acute pulmonary HTN. -4/14 cardiology notified concerning findings awaiting recommendations on if cardiac catheterization to occur. -Cardiology recommends [facility] this admission but would like her to be fever free at least 48 hours prior to procedure. -We will monitor patient's symptoms to ensure she meets cardiology parameters for cardiac catheterization. -4/15 patient has been fever free for 48 hours, normal WBC.  Should meet criteria for cardiology to perform cardiac catheterization.  Acute systolic CHF/NSTEMI/severe three-vessel disease -EF 40 to 45% see echocardiogram results below -Strict in and out +683.27ml -Daily weight -See elevated troponin -Scheduled for cardiac catheterization 4/16 -4/16 Coreg 3.125 mg BID.  Increase on 4/18 if BP tolerates -4/17 losartan 25 mg daily -4/16 cardiac catheterization showed severe three-vessel disease, given patient's other multiple medical conditions cardiology feels patient would not survive CABG see results below -Aggressive medical management  Acute pulmonary hypertension -See CHF/elevated troponin  Acute kidney injury vs CKD -Previous creatinine= 1.08 on 01/03/2011 -Most likely patient's renal function has been worsening over the years and she has not known this. Recent Labs  Lab 03/20/20 1701 03/21/20 0645 03/22/20 0233 03/23/20 0218 03/24/20 0346  CREATININE 1.48* 1.29* 1.21* 1.24* 1.25*  -Gently hydrate normal saline 70ml/hr  Prolonged QT interval: Acute.  QTc initially 586. -Correct any electrolyte abnormalities -Avoid any QT prolonging medication -Recheck EKG in a.m.   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 -After resolution of cardiac issues will consult surgery and address.    Metabolic acidosis with elevated  anion gap -4/14 resolved  Multiple sclerosis:  -Patient reports that she has been off of any kind of treatment over the last 10 years as she felt they were having no effect.  Patient reports that she has been using Tylenol and ibuprofen. -Check acetaminophen and salicylate level -Robaxin as needed for muscle spasms   Normocytic anemia: Acute.   -Hemoglobin 10.3 g/dL on admission, but previously noted to be within normal limits. Recent Labs  Lab 03/20/20 1701 03/21/20 0645 03/22/20 0233 03/23/20 0218 03/24/20 0346  HGB 9.2* 9.2* 8.7* 8.5* 8.3*  -Transfuse for hemoglobin<7  Diarrhea  -C. difficile negative -GI panel negative  Insomnia -Trazodone 50 mg QHS PRN  Hypokalemia --K-Dur 47meq      DVT prophylaxis: Subcu heparin Code Status:  Family Communication: 4/16 spoke with [name] (daughter) counseled on plan of care answered all questions Disposition Plan:  1.  Where the patient is from 2.  Anticipated d/c place. 3.  Barriers to d/c per cardiology   Consultants:  Cardiology    Procedures/Significant Events:  4/13 Echocardiogram;Left Ventricle: LVEF=40 -45%.-Global hypokinesis.  -Left ventricular diastolic function could not be evaluated due to mitral stenosis.  Right Ventricle:  moderately elevated pulmonary artery systolic pressure.  The tricuspid regurgitant velocity is 3.30 m/s, and with an assumed right atrial pressure of 15 mmHg, the estimated right ventricular systolic pressure is 0000000 mmHg.  Mitral Valve: Rheumatic. There is moderate thickening of the mitral valve leaflet(s). Moderate mitral subvalvular thickening/fibrosis. Moderate to severe mitral valve regurgitation, with  posteriorly-directed jet. Mild mitral valve stenosis. The mean mitral valve gradient is  7.3 mmHg with average heart  rate of 105 bpm.  Venous: The inferior vena cava is dilated in size with less than 50% respiratory variability, suggesting right atrial pressure of 15 mmHg.  4/14 CTA  chest PE protocol;-ground-glass opacities with history favoring atypical/viral pneumonia. -Small pleural effusions and interlobular septal thickening not typically associated with viral pneumonia, findings could reflect atypical edema or failure superimposed on infection. -Left retroperitoneal mass, ganglioneuroma at 2007 pathology.There is residual or recurrent mass adjacent to the retroperitoneal clips, 5.6 cm. Was the patient is prior resection subtotal? 4/16 cardiac catheterization;- LV end diastolic pressure is moderately elevated. -Ost RCA to Dist RCA lesion is 100% stenosed. - PDA and PL system fills via faint collaterals from the AV groove LCx and LAD septals. -Mid LAD-1 lesion is 60% stenosed. Mid LAD-2 lesion is 50% stenosed. Dist LAD lesion is 55% stenosed with 70% stenosed side branch in 2nd Diag. -1st Diag lesion is 70% stenosed. -Ost Cx to Prox Cx lesion is 45% stenosed. Prox-MID Cx lesion is 65% stenosed.    MODERATE-SEVERE THREE-VESSEL CAD: ? 100% proximal RCA occlusion with left-to-right collaterals faintly filling PDA and PL system (AV groove LCx-PL and LAD septal-PDA) ? Diffuse moderate mid LAD disease with 60% to 50% stenosis. ? Tandem 50% and 65% proximal and mid LCx with the 65% lesion being the most significant lesion.  Moderately elevated LVEDP of 18-20 mmHg  Given the extent of disease in the LAD and LCx, neither lesion is very amenable to PCI, and RCA is chronically occluded.  Given her comorbidities, I do not think she would be a good CABG candidate especially in light of the fact that she would likely require valve surgery as well.  She would not recover well.  4/17 PCXR;Stable bilateral interstitial densities are noted concerning for edema or possibly atypical infection   I have personally reviewed and interpreted all radiology studies and my findings are as above.  VENTILATOR SETTINGS:    Cultures 4/13 SARS coronavirus negative 4/13 influenza A/B  negative 4/13 respiratory virus panel negative 4/13 HIV nonreactive 4/13 blood right forearm NGTD 4/13 blood left arm NGTD 4/14 C. difficile antigen/toxin negative 4/14 GI panel negative     Antimicrobials: Anti-infectives (From admission, onward)   Start     Stop   03/21/20 0630  cefTRIAXone (ROCEPHIN) 1 g in sodium chloride 0.9 % 100 mL IVPB         03/20/20 1430  doxycycline (VIBRA-TABS) tablet 100 mg         03/20/20 0545  cefTRIAXone (ROCEPHIN) 1 g in sodium chloride 0.9 % 100 mL IVPB     03/20/20 0730   03/20/20 0545  azithromycin (ZITHROMAX) tablet 500 mg     03/20/20 [id number]       Devices    LINES / TUBES:      Continuous Infusions: . sodium chloride    . cefTRIAXone (ROCEPHIN)  IV 1 g (03/24/20 0607)     Objective: Vitals:   03/24/20 0559 03/24/20 0749 03/24/20 0753 03/24/20 0755  BP:  (!) 122/92  118/90  Pulse:   (!) 104 (!) 105  Resp:  (!) 22 (!) 22 18  Temp:  98.1 F (36.7 C)  (!) 97.5 F (36.4 C)  TempSrc:  Oral  Oral  SpO2:   95% 95%  Weight: 70.6 kg     Height:        Intake/Output Summary (Last 24 hours) at 03/24/2020 1125 Last data filed at 03/23/2020 1600 Gross per  24 hour  Intake 564.17 ml  Output --  Net 564.17 ml   Filed Weights   03/22/20 0540 03/23/20 0500 03/24/20 0559  Weight: 67.2 kg 67.9 kg 70.6 kg   Physical Exam:  General: No x4, positive acute respiratory distress Eyes: negative scleral hemorrhage, negative anisocoria, negative icterus ENT: Negative Runny nose, negative gingival bleeding, Neck:  Negative scars, masses, torticollis, lymphadenopathy, JVD Lungs: Tachypnea diffuse expiratory wheezes, negative crackles, poor air movement Cardiovascular: Tachycardic without murmur gallop or rub normal S1 and S2 Abdomen: negative abdominal pain, nondistended, positive soft, bowel sounds, no rebound, no ascites, no appreciable mass Extremities: No significant cyanosis, clubbing, or edema bilateral lower extremities Skin:  Negative rashes, lesions, ulcers Psychiatric:  Negative depression, positive anxiety, negative fatigue, negative mania  Central nervous system:  Cranial nerves II through XII intact, tongue/uvula midline, all extremities muscle strength 5/5, sensation intact throughout,  negative dysarthria, negative expressive aphasia, negative receptive aphasia.      Data Reviewed: Care during the described time interval was provided by me .  I have reviewed this patient's available data, including medical history, events of note, physical examination, and all test results as part of my evaluation.   CBC: Recent Labs  Lab 03/20/20 0232 03/20/20 0232 03/20/20 1701 03/21/20 0645 03/22/20 0233 03/23/20 0218 03/24/20 0346  WBC 8.1  --   --  8.6 8.9 7.1 12.5*  HGB 10.3*   < > 9.2* 9.2* 8.7* 8.5* 8.3*  HCT 33.3*   < > 28.9* 28.6* 27.3* 26.6* 26.0*  MCV 95.4  --   --  93.8 93.8 93.7 93.9  PLT 363  --   --  306 294 285 357   < > = values in this interval not displayed.   Basic Metabolic Panel: Recent Labs  Lab 03/20/20 0232 03/20/20 0743 03/20/20 1701 03/21/20 0645 03/22/20 0233 03/23/20 0218 03/24/20 0346  NA   < >  --  134* 138 137 137 138  K   < >  --  3.2* 3.8 3.9 3.4* 4.4  CL   < >  --  105 106 105 106 110  CO2   < >  --  16* 19* 19* 18* 16*  GLUCOSE   < >  --  115* 107* 106* 114* 133*  BUN   < >  --  19 20 18 17  24*  CREATININE   < >  --  1.48* 1.29* 1.21* 1.24* 1.25*  CALCIUM   < >  --  8.3* 8.5* 8.7* 8.3* 9.0  MG  --  1.8  --  2.0 2.1 2.2 2.1  PHOS  --   --   --   --  3.6 3.3 4.1   < > = values in this interval not displayed.   GFR: Estimated Creatinine Clearance: 46.8 mL/min (A) (by C-G formula based on SCr of 1.25 mg/dL (H)). Liver Function Tests: Recent Labs  Lab 03/20/20 0826 03/22/20 0233 03/23/20 0218 03/24/20 0346  AST 18 30 57* 41  ALT 11 17 39 41  ALKPHOS 56 55 64 68  BILITOT 0.7 0.7 0.4 0.5  PROT 6.2* 6.3* 6.1* 6.6  ALBUMIN 3.1* 2.8* 2.7* 2.7*   No results  for input(s): LIPASE, AMYLASE in the last 168 hours. No results for input(s): AMMONIA in the last 168 hours. Coagulation Profile: No results for input(s): INR, PROTIME in the last 168 hours. Cardiac Enzymes: Recent Labs  Lab 03/20/20 0810  CKTOTAL 205   BNP (last 3 results) No  results for input(s): PROBNP in the last 8760 hours. HbA1C: No results for input(s): HGBA1C in the last 72 hours. CBG: No results for input(s): GLUCAP in the last 168 hours. Lipid Profile: No results for input(s): CHOL, HDL, LDLCALC, TRIG, CHOLHDL, LDLDIRECT in the last 72 hours. Thyroid Function Tests: No results for input(s): TSH, T4TOTAL, FREET4, T3FREE, THYROIDAB in the last 72 hours. Anemia Panel: No results for input(s): VITAMINB12, FOLATE, FERRITIN, TIBC, IRON, RETICCTPCT in the last 72 hours. Urine analysis:    Component Value Date/Time   COLORURINE YELLOW 03/20/2020 1600   APPEARANCEUR CLEAR 03/20/2020 1600   LABSPEC 1.026 03/20/2020 1600   PHURINE 5.0 03/20/2020 1600   GLUCOSEU NEGATIVE 03/20/2020 1600   HGBUR MODERATE (A) 03/20/2020 1600   BILIRUBINUR NEGATIVE 03/20/2020 1600   KETONESUR NEGATIVE 03/20/2020 1600   PROTEINUR NEGATIVE 03/20/2020 1600   NITRITE NEGATIVE 03/20/2020 1600   LEUKOCYTESUR SMALL (A) 03/20/2020 1600   Sepsis Labs: @LABRCNTIP (procalcitonin:4,lacticidven:4)  ) Recent Results (from the past 240 hour(s))  Respiratory Panel by RT PCR (Flu A&B, Covid) -     Status: None   Collection Time: 03/20/20  5:54 AM  Result Value Ref Range Status   SARS Coronavirus 2 by RT PCR NEGATIVE NEGATIVE Final    Comment: (NOTE) SARS-CoV-2 target nucleic acids are NOT DETECTED. The SARS-CoV-2 RNA is generally detectable in upper respiratoy specimens during the acute phase of infection. The lowest concentration of SARS-CoV-2 viral copies this assay can detect is 131 copies/mL. A negative result does not preclude SARS-Cov-2 infection and should not be used as the sole basis for  treatment or other patient management decisions. A negative result may occur with  improper specimen collection/handling, submission of specimen other than nasopharyngeal swab, presence of viral mutation(s) within the areas targeted by this assay, and inadequate number of viral copies (<131 copies/mL). A negative result must be combined with clinical observations, patient history, and epidemiological information. The expected result is Negative. Fact Sheet for Patients:  PinkCheek.be Fact Sheet for Healthcare Providers:  GravelBags.it This test is not yet ap proved or cleared by the [place] FDA and  has been authorized for detection and/or diagnosis of SARS-CoV-2 by FDA under an Emergency Use Authorization (EUA). This EUA will remain  in effect (meaning this test can be used) for the duration of the COVID-19 declaration under Section 564(b)(1) of the Act, 21 U.S.C. section 360bbb-3(b)(1), unless the authorization is terminated or revoked sooner.    Influenza A by PCR NEGATIVE NEGATIVE Final   Influenza B by PCR NEGATIVE NEGATIVE Final    Comment: (NOTE) The Xpert Xpress SARS-CoV-2/FLU/RSV assay is intended as an aid in  the diagnosis of influenza from Nasopharyngeal swab specimens and  should not be used as a sole basis for treatment. Nasal washings and  aspirates are unacceptable for Xpert Xpress SARS-CoV-2/FLU/RSV  testing. Fact Sheet for Patients: PinkCheek.be Fact Sheet for Healthcare Providers: GravelBags.it This test is not yet approved or cleared by the [place] FDA and  has been authorized for detection and/or diagnosis of SARS-CoV-2 by  FDA under an Emergency Use Authorization (EUA). This EUA will remain  in effect (meaning this test can be used) for the duration of the  Covid-19 declaration under Section 564(b)(1) of the Act, 21  U.S.C. section  360bbb-3(b)(1), unless the authorization is  terminated or revoked. Performed at [facility], [place] [address], [place], [place] [address]   Respiratory Panel by PCR     Status: None  Collection Time: 03/20/20  5:54 AM   Specimen: Nasopharyngeal Swab; Respiratory  Result Value Ref Range Status   Adenovirus NOT DETECTED NOT DETECTED Final   Coronavirus 229E NOT DETECTED NOT DETECTED Final    Comment: (NOTE) The Coronavirus on the Respiratory Panel, DOES NOT test for the novel  Coronavirus (2019 nCoV)    Coronavirus HKU1 NOT DETECTED NOT DETECTED Final   Coronavirus NL63 NOT DETECTED NOT DETECTED Final   Coronavirus OC43 NOT DETECTED NOT DETECTED Final   Metapneumovirus NOT DETECTED NOT DETECTED Final   Rhinovirus / Enterovirus NOT DETECTED NOT DETECTED Final   Influenza A NOT DETECTED NOT DETECTED Final   Influenza B NOT DETECTED NOT DETECTED Final   Parainfluenza Virus 1 NOT DETECTED NOT DETECTED Final   Parainfluenza Virus 2 NOT DETECTED NOT DETECTED Final   Parainfluenza Virus 3 NOT DETECTED NOT DETECTED Final   Parainfluenza Virus 4 NOT DETECTED NOT DETECTED Final   Respiratory Syncytial Virus NOT DETECTED NOT DETECTED Final   Bordetella pertussis NOT DETECTED NOT DETECTED Final   Chlamydophila pneumoniae NOT DETECTED NOT DETECTED Final   Mycoplasma pneumoniae NOT DETECTED NOT DETECTED Final    Comment: Performed at [facility], [place]. [address] [place], [place] [address]  Blood Culture (routine x 2)     Status: None (Preliminary result)   Collection Time: 03/20/20  7:31 AM   Specimen: BLOOD RIGHT FOREARM  Result Value Ref Range Status   Specimen Description BLOOD RIGHT FOREARM  Final   Special Requests   Final    BOTTLES DRAWN AEROBIC AND ANAEROBIC Blood Culture adequate volume   Culture   Final    NO GROWTH 4 DAYS Performed at [facility], [place] [address], [place], [place] [address]    Report Status PENDING  Incomplete  Blood Culture (routine x 2)      Status: None (Preliminary result)   Collection Time: 03/20/20  8:06 AM   Specimen: BLOOD LEFT ARM  Result Value Ref Range Status   Specimen Description BLOOD LEFT ARM  Final   Special Requests   Final    BOTTLES DRAWN AEROBIC AND ANAEROBIC Blood Culture adequate volume   Culture   Final    NO GROWTH 4 DAYS Performed at [facility], [place] [address] [place], [place] [address]    Report Status PENDING  Incomplete  MRSA PCR Screening     Status: None   Collection Time: 03/20/20  3:52 PM   Specimen: Nasopharyngeal  Result Value Ref Range Status   MRSA by PCR NEGATIVE NEGATIVE Final    Comment:        The GeneXpert MRSA Assay (FDA approved for NASAL specimens only), is one component of a comprehensive MRSA colonization surveillance program. It is not intended to diagnose MRSA infection nor to guide or monitor treatment for MRSA infections. Performed at [facility], [place] [address], [place], [place] [address]   Gastrointestinal Panel by PCR , Stool     Status: None   Collection Time: 03/21/20  1:50 PM   Specimen: STOOL  Result Value Ref Range Status   Campylobacter species NOT DETECTED NOT DETECTED Final   Plesimonas shigelloides NOT DETECTED NOT DETECTED Final   Salmonella species NOT DETECTED NOT DETECTED Final   Yersinia enterocolitica NOT DETECTED NOT DETECTED Final   Vibrio species NOT DETECTED NOT DETECTED Final   Vibrio cholerae NOT DETECTED NOT DETECTED Final   Enteroaggregative E coli (EAEC) NOT DETECTED NOT DETECTED  Final   Enteropathogenic E coli (EPEC) NOT DETECTED NOT DETECTED Final   Enterotoxigenic E coli (ETEC) NOT DETECTED NOT DETECTED Final   Shiga like toxin producing E coli (STEC) NOT DETECTED NOT DETECTED Final   Shigella/Enteroinvasive E coli (EIEC) NOT DETECTED NOT DETECTED Final   Cryptosporidium NOT DETECTED NOT DETECTED Final   Cyclospora cayetanensis NOT DETECTED NOT DETECTED Final   Entamoeba histolytica NOT DETECTED NOT DETECTED Final     Giardia lamblia NOT DETECTED NOT DETECTED Final   Adenovirus F40/41 NOT DETECTED NOT DETECTED Final   Astrovirus NOT DETECTED NOT DETECTED Final   Norovirus GI/GII NOT DETECTED NOT DETECTED Final   Rotavirus A NOT DETECTED NOT DETECTED Final   Sapovirus (I, II, IV, and V) NOT DETECTED NOT DETECTED Final    Comment: Performed at [facility], [place] [place], [place] [address]  C Difficile Quick Screen w PCR reflex     Status: None   Collection Time: 03/21/20  1:50 PM   Specimen: STOOL  Result Value Ref Range Status   C Diff antigen NEGATIVE NEGATIVE Final   C Diff toxin NEGATIVE NEGATIVE Final   C Diff interpretation No C. difficile detected.  Final    Comment: Performed at [facility], [place] [address] [place], [place] [address]         Radiology Studies: CARDIAC CATHETERIZATION  Result Date: 03/23/2020  Hemodynamics: LV end diastolic pressure is moderately elevated.  ----Coronary Angiography-----  Ost RCA to Dist RCA lesion is 100% stenosed.-The PDA and PL system fills via faint collaterals from the AV groove LCx and LAD septals.  Mid LAD-1 lesion is 60% stenosed. Mid LAD-2 lesion is 50% stenosed. Dist LAD lesion is 55% stenosed with 70% stenosed side branch in 2nd Diag.  1st Diag lesion is 70% stenosed.  Ost Cx to Prox Cx lesion is 45% stenosed. Prox-MID Cx lesion is 65% stenosed.   MODERATE-SEVERE THREE-VESSEL CAD:  100% proximal RCA occlusion with left-to-right collaterals faintly filling PDA and PL system (AV groove LCx-PL and LAD septal-PDA)  Diffuse moderate mid LAD disease with 60% to 50% stenosis.  Tandem 50% and 65% proximal and mid LCx with the 65% lesion being the most significant lesion.  Moderately elevated LVEDP of 18-20 mmHg Given the extent of disease in the LAD and LCx, neither lesion is very amenable to PCI, and RCA is chronically occluded.  Given her comorbidities, I do not think she would be a good CABG candidate especially in light  of the fact that she would likely require valve surgery as well.  She would not recover well. Recommendations aggressive medical management. [name], MD  DG CHEST PORT 1 VIEW  Result Date: 03/24/2020 CLINICAL DATA:  Community-acquired pneumonia. EXAM: PORTABLE CHEST 1 VIEW COMPARISON:  March 20, 2020. FINDINGS: The heart size and mediastinal contours are within normal limits. No pneumothorax or pleural effusion is noted. Stable bilateral interstitial densities are noted concerning for edema or possibly atypical infection. The visualized skeletal structures are unremarkable. IMPRESSION: Stable bilateral interstitial densities are noted concerning for edema or possibly atypical infection. Electronically Signed   By: Marijo Conception M.D.   On: 03/24/2020 08:52        Scheduled Meds: . aspirin EC  81 mg Oral Daily  . budesonide (PULMICORT) nebulizer solution  0.25 mg Nebulization BID  . carvedilol  3.125 mg Oral BID WC  . doxycycline  100 mg Oral Q12H  . heparin  5,000 Units Subcutaneous Q8H  .  losartan  25 mg Oral Daily  . methylPREDNISolone (SOLU-MEDROL) injection  60 mg Intravenous Q24H  . nicotine  21 mg Transdermal Daily  . rosuvastatin  20 mg Oral Daily  . sodium chloride flush  3 mL Intravenous Q12H  . sodium chloride flush  3 mL Intravenous Q12H  . sodium chloride flush  3 mL Intravenous Q12H   Continuous Infusions: . sodium chloride    . cefTRIAXone (ROCEPHIN)  IV 1 g (03/24/20 0607)     LOS: 4 days   The patient is critically ill with multiple organ systems failure and requires high complexity decision making for assessment and support, frequent evaluation and titration of therapies, application of advanced monitoring technologies and extensive interpretation of multiple databases. Critical Care Time devoted to patient care services described in this note  Time spent: 40 minutes     Brecklynn Jian, Geraldo Docker, MD [facility] Pager [phone]  If 7PM-7AM, please  contact night-coverage [email or web address] Password [facility] 03/24/2020, 11:25 AM

## 2020-03-25 DIAGNOSIS — I255 Ischemic cardiomyopathy: Secondary | ICD-10-CM | POA: Diagnosis not present

## 2020-03-25 DIAGNOSIS — I2510 Atherosclerotic heart disease of native coronary artery without angina pectoris: Secondary | ICD-10-CM | POA: Diagnosis not present

## 2020-03-25 DIAGNOSIS — R778 Other specified abnormalities of plasma proteins: Secondary | ICD-10-CM | POA: Diagnosis not present

## 2020-03-25 DIAGNOSIS — J189 Pneumonia, unspecified organism: Secondary | ICD-10-CM | POA: Diagnosis not present

## 2020-03-25 DIAGNOSIS — C801 Malignant (primary) neoplasm, unspecified: Secondary | ICD-10-CM

## 2020-03-25 LAB — CBC
HCT: 25.2 % — ABNORMAL LOW (ref 36.0–46.0)
Hemoglobin: 7.9 g/dL — ABNORMAL LOW (ref 12.0–15.0)
MCH: 29.4 pg (ref 26.0–34.0)
MCHC: 31.3 g/dL (ref 30.0–36.0)
MCV: 93.7 fL (ref 80.0–100.0)
Platelets: 342 10*3/uL (ref 150–400)
RBC: 2.69 MIL/uL — ABNORMAL LOW (ref 3.87–5.11)
RDW: 15.6 % — ABNORMAL HIGH (ref 11.5–15.5)
WBC: 11.3 10*3/uL — ABNORMAL HIGH (ref 4.0–10.5)
nRBC: 0 % (ref 0.0–0.2)

## 2020-03-25 LAB — MAGNESIUM: Magnesium: 2.1 mg/dL (ref 1.7–2.4)

## 2020-03-25 LAB — COMPREHENSIVE METABOLIC PANEL
ALT: 181 U/L — ABNORMAL HIGH (ref 0–44)
AST: 265 U/L — ABNORMAL HIGH (ref 15–41)
Albumin: 2.7 g/dL — ABNORMAL LOW (ref 3.5–5.0)
Alkaline Phosphatase: 59 U/L (ref 38–126)
Anion gap: 12 (ref 5–15)
BUN: 34 mg/dL — ABNORMAL HIGH (ref 6–20)
CO2: 16 mmol/L — ABNORMAL LOW (ref 22–32)
Calcium: 8.9 mg/dL (ref 8.9–10.3)
Chloride: 107 mmol/L (ref 98–111)
Creatinine, Ser: 1.43 mg/dL — ABNORMAL HIGH (ref 0.44–1.00)
GFR calc Af Amer: 47 mL/min — ABNORMAL LOW (ref >60)
GFR calc non Af Amer: 41 mL/min — ABNORMAL LOW (ref >60)
Glucose, Bld: 146 mg/dL — ABNORMAL HIGH (ref 70–99)
Potassium: 4.7 mmol/L (ref 3.5–5.1)
Sodium: 135 mmol/L (ref 135–145)
Total Bilirubin: 0.7 mg/dL (ref 0.3–1.2)
Total Protein: 6.1 g/dL — ABNORMAL LOW (ref 6.5–8.1)

## 2020-03-25 LAB — CULTURE, BLOOD (ROUTINE X 2)
Culture: NO GROWTH
Culture: NO GROWTH
Special Requests: ADEQUATE
Special Requests: ADEQUATE

## 2020-03-25 LAB — LACTIC ACID, PLASMA
Lactic Acid, Venous: 1.7 mmol/L (ref 0.5–1.9)
Lactic Acid, Venous: 1.4 mmol/L (ref 0.5–1.9)

## 2020-03-25 LAB — PROCALCITONIN: Procalcitonin: 0.22 ng/mL

## 2020-03-25 LAB — PHOSPHORUS: Phosphorus: 5.3 mg/dL — ABNORMAL HIGH (ref 2.5–4.6)

## 2020-03-25 MED ORDER — CARVEDILOL 6.25 MG PO TABS
6.2500 mg | ORAL_TABLET | Freq: Two times a day (BID) | ORAL | Status: DC
  Administered 2020-03-25 – 2020-03-28 (×6): 6.25 mg via ORAL
  Filled 2020-03-25 (×6): qty 1

## 2020-03-25 MED ORDER — LEVALBUTEROL HCL 0.63 MG/3ML IN NEBU
0.6300 mg | INHALATION_SOLUTION | Freq: Four times a day (QID) | RESPIRATORY_TRACT | Status: DC
  Administered 2020-03-25 (×2): 0.63 mg via RESPIRATORY_TRACT
  Filled 2020-03-25 (×2): qty 3

## 2020-03-25 MED ORDER — LEVALBUTEROL HCL 0.63 MG/3ML IN NEBU
0.6300 mg | INHALATION_SOLUTION | Freq: Two times a day (BID) | RESPIRATORY_TRACT | Status: DC
  Administered 2020-03-25 – 2020-03-28 (×6): 0.63 mg via RESPIRATORY_TRACT
  Filled 2020-03-25 (×6): qty 3

## 2020-03-25 MED ORDER — LEVALBUTEROL HCL 0.63 MG/3ML IN NEBU: 0.6300 mg | INHALATION_SOLUTION | Freq: Four times a day (QID) | RESPIRATORY_TRACT | Status: DC | PRN

## 2020-03-25 MED ORDER — CALCIUM CARBONATE ANTACID 500 MG PO CHEW
1.0000 | CHEWABLE_TABLET | Freq: Three times a day (TID) | ORAL | Status: DC
  Administered 2020-03-25 – 2020-03-28 (×9): 200 mg via ORAL
  Filled 2020-03-25 (×9): qty 1

## 2020-03-25 NOTE — Progress Notes (Signed)
PROGRESS NOTE    Anita [name]  [id number] DOB: 1960-12-29 DOA: 03/20/2020 PCP: [name], NP   Brief Narrative:  Anita [name] is a 59 y.o. WF PMHx Multiple Sclerosis with Optic neuritis, RIGHT ear hearing loss, migraine headache, obese, GERD, arthritis, tobacco abuse, and allergies   Presents with complaints of shortness of breath.  Reports having intermittent episodes of shortness of breath lasting approximately 2 minutes before self resolving over the last 3-4 days.  During episode she would have palpitations, diaphoresis, and feel anxious.  Symptoms would gradually resolve on their own.  Over this weekend she also has reported having diarrhea 3-4 episodes per day, nausea, and mostly nonproductive cough.  Diarrhea was noted to be yellowish-orange in she did not notice any blood.  She had tried taking Mucinex, but thought that it may have been causing her to have diarrhea.  Denied having any recent sick contacts or knowledge of chest pain, abdominal pain, leg swelling, weight gain, dysuria, or vomiting,.  However, last night while trying to go to bed developed severe shortness of breath which did not go away. Laying seem to worsen symptoms.   Reports smoking half a pack cigarettes per day on average.  She has been off of treatment for MS for at least 10 years now because she felt that they did not help.  Ambulates with difficulty using a walker.  She has been muscle spasms and utilizes Tylenol to treat symptoms.  Patient notes family history significant for mother who had a heart attack at age 22.   ED Course: Upon admission into the emergency department patient was seen to be afebrile with pulse 113-153, respirations 23-30, blood pressures maintained, and O2 saturations 93-98% on room air.  Labs significant for WBC 8.1, hemoglobin 10.3, CO2 16, BUN 21, creatinine 1.3, glucose 195, anion gap 18, troponin 2581-> 2513, and BNP 1255.6.  Chest x-ray showed diffuse  interstitial process concerning for atypical pneumonia versus edema.  Patient had been given heparin bolus of 4000 units, Rocephin azithromycin, and 1 L normal saline IV fluids.  Cardiology has been consulted due to the elevated troponin.  TRH called to admit.   Subjective: 4/18 A/O x4, negative CP, positive S OB but improved from 4/17.  Positive productive cough with use of flutter valve.    Assessment & Plan:   Principal Problem:   Acute respiratory distress Active Problems:   TOBACCO ABUSE   Multiple sclerosis (HCC)   Acute respiratory failure with hypoxia (HCC)   Prolonged QT interval   NSTEMI (non-ST elevated myocardial infarction) (HCC)   SIRS (systemic inflammatory response syndrome) (HCC)   Normocytic anemia   Pneumonia   AKI (acute kidney injury) ([place])   Respiratory distress   Atypical pneumonia   Tobacco abuse   Elevated troponin   Acute systolic CHF (congestive heart failure) (HCC)   Pulmonary hypertension (HCC)   Retroperitoneal mass   Insomnia  Acute respiratory failure with hypoxia -Xopenex QID -Flutter valve -Incentive spirometer -Complete 7-day course antibiotics.  Azithromycin discontinued secondary to prolonged QT interval. -Discussed case with pharmacy and patient has been on high dose of steroids in the past for her MS.  Solu-Medrol 60 mg daily. -->  DC 4/19 -4/16 Pulmicort nebulizer BID -4/17 PCXR; bilateral interstitial densities see results below  -Continuous pulse ox -4/17 maintain patient on 2 L O2 at all times desats whenever she talks, most likely desats when sleeping. -Titrate O2 to maintain SPO2> 94%  Atypical pneumonia -Patient CT angiogram  4/13 consistent with viral/atypical pneumonia see results below -See acute respiratory distress -All viral panels negative see results below -4/19 PCXR pending -Procalcitonin and lactic acid pending   SIRS:  -Initially presented tachycardia, tachypnea, WBC were WNL. -Most likely respiratory  cause. -Blood cultures NGTD   Tobacco abuse -Patient was counseled on need to absolutely discontinue smoking forever.  Had been smoking 1/2 PPD since 59 years old -Nicotine patch  Elevated troponin -Initially thought to be demand ischemia however echocardiogram shows acute systolic CHF and acute pulmonary HTN. -4/14 cardiology notified concerning findings awaiting recommendations on if cardiac catheterization to occur. -Cardiology recommends [facility] this admission but would like her to be fever free at least 48 hours prior to procedure. -We will monitor patient's symptoms to ensure she meets cardiology parameters for cardiac catheterization. -4/15 patient has been fever free for 48 hours, normal WBC.  Should meet criteria for cardiology to perform cardiac catheterization.  Acute systolic CHF/NSTEMI/severe three-vessel disease -EF 40 to 45% see echocardiogram results below -Strict in and out +2.6 L -Daily weight Filed Weights   03/24/20 0559 03/24/20 1536 03/25/20 0500  Weight: 70.6 kg 66.4 kg 68.9 kg  -See elevated troponin -4/18 increase Coreg 6.25 mg BID, HR still hovering around 100  -4/17 losartan 25 mg daily -4/16 cardiac catheterization showed severe three-vessel disease, given patient's other multiple medical conditions cardiology feels patient would not survive CABG see results below -Aggressive medical management -4/18 Nutrition; patient requests to speak with nutritionist concerning cardiac diet at home  Acute pulmonary hypertension -See CHF/elevated troponin  Acute kidney injury vs CKD -Previous creatinine= 1.08 on 01/03/2011 -Most likely patient's renal function has been worsening over the years and she has not known this. Recent Labs  Lab 03/21/20 0645 03/22/20 0233 03/23/20 0218 03/24/20 0346 03/25/20 0226  CREATININE 1.29* 1.21* 1.24* 1.25* 1.43*  -Slight increase in creatinine most likely secondary to starting ARB  Prolonged QT interval: Acute.  QTc initially  586. -Correct any electrolyte abnormalities -Avoid any QT prolonging medication -Recheck EKG in a.m.   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 -Was seen by Dr. Eston Esters oncology in 2007; no longer practices -4/18 discussed case with NP Katie [facility] feels that patient probably not a surgical candidate, best course of action would be to reestablish care with Oncologist first if patient desires and then come up with plan..   -4/18 after explaining options patient not interested in pursuing further diagnosis/therapeutic options.  Metabolic acidosis with elevated anion gap -4/14 resolved  Multiple sclerosis:  -Patient reports that she has been off of any kind of treatment over the last 10 years as she felt they were having no effect.  Patient reports that she has been using Tylenol and ibuprofen. -Robaxin as needed for muscle spasms   Normocytic anemia: Acute.   -Hemoglobin 10.3 g/dL on admission, but previously noted to be within normal limits. Recent Labs  Lab 03/21/20 0645 03/22/20 0233 03/23/20 0218 03/24/20 0346 03/25/20 0226  HGB 9.2* 8.7* 8.5* 8.3* 7.9*  -Transfuse for hemoglobin<7  Diarrhea  -C. difficile negative -GI panel negative  Insomnia -Trazodone 50 mg QHS PRN  Hypokalemia --K-Dur 76meq      DVT prophylaxis: Subcu heparin Code Status:  Family Communication: 4/18 [name] (daughter) at bedside counseled on plan of care answered all questions Disposition Plan:  1.  Where the patient is from 2.  Anticipated d/c place. 3.  Barriers to d/c per cardiology   Consultants:  Cardiology Phone consult [facility] NP Katie   Procedures/Significant Events:  4/13 Echocardiogram;Left Ventricle: LVEF=40 -45%.-Global hypokinesis.  -Left ventricular diastolic function could not be evaluated due to mitral stenosis.  Right Ventricle:  moderately elevated pulmonary artery systolic pressure.  The tricuspid  regurgitant velocity is 3.30 m/s, and with an assumed right atrial pressure of 15 mmHg, the estimated right ventricular systolic pressure is 0000000 mmHg.  Mitral Valve: Rheumatic. There is moderate thickening of the mitral valve leaflet(s). Moderate mitral subvalvular thickening/fibrosis. Moderate to severe mitral valve regurgitation, with  posteriorly-directed jet. Mild mitral valve stenosis. The mean mitral valve gradient is 7.3 mmHg with average heart  rate of 105 bpm.  Venous: The inferior vena cava is dilated in size with less than 50% respiratory variability, suggesting right atrial pressure of 15 mmHg.  4/14 CTA chest PE protocol;-ground-glass opacities with history favoring atypical/viral pneumonia. -Small pleural effusions and interlobular septal thickening not typically associated with viral pneumonia, findings could reflect atypical edema or failure superimposed on infection. -Left retroperitoneal mass, ganglioneuroma at 2007 pathology.There is residual or recurrent mass adjacent to the retroperitoneal clips, 5.6 cm. Was the patient is prior resection subtotal? 4/16 cardiac catheterization;- LV end diastolic pressure is moderately elevated. -Ost RCA to Dist RCA lesion is 100% stenosed. - PDA and PL system fills via faint collaterals from the AV groove LCx and LAD septals. -Mid LAD-1 lesion is 60% stenosed. Mid LAD-2 lesion is 50% stenosed. Dist LAD lesion is 55% stenosed with 70% stenosed side branch in 2nd Diag. -1st Diag lesion is 70% stenosed. -Ost Cx to Prox Cx lesion is 45% stenosed. Prox-MID Cx lesion is 65% stenosed.    MODERATE-SEVERE THREE-VESSEL CAD: ? 100% proximal RCA occlusion with left-to-right collaterals faintly filling PDA and PL system (AV groove LCx-PL and LAD septal-PDA) ? Diffuse moderate mid LAD disease with 60% to 50% stenosis. ? Tandem 50% and 65% proximal and mid LCx with the 65% lesion being the most significant lesion.  Moderately elevated LVEDP of 18-20  mmHg  Given the extent of disease in the LAD and LCx, neither lesion is very amenable to PCI, and RCA is chronically occluded.  Given her comorbidities, I do not think she would be a good CABG candidate especially in light of the fact that she would likely require valve surgery as well.  She would not recover well.  4/17 PCXR;Stable bilateral interstitial densities are noted concerning for edema or possibly atypical infection   I have personally reviewed and interpreted all radiology studies and my findings are as above.  VENTILATOR SETTINGS:    Cultures 4/13 SARS coronavirus negative 4/13 influenza A/B negative 4/13 respiratory virus panel negative 4/13 HIV nonreactive 4/13 blood right forearm NGTD 4/13 blood left arm NGTD 4/14 C. difficile antigen/toxin negative 4/14 GI panel negative 4/18 sputum pending     Antimicrobials: Anti-infectives (From admission, onward)   Start     Stop   03/21/20 0630  cefTRIAXone (ROCEPHIN) 1 g in sodium chloride 0.9 % 100 mL IVPB         03/20/20 1430  doxycycline (VIBRA-TABS) tablet 100 mg         03/20/20 0545  cefTRIAXone (ROCEPHIN) 1 g in sodium chloride 0.9 % 100 mL IVPB     03/20/20 0730   03/20/20 0545  azithromycin (ZITHROMAX) tablet 500 mg     03/20/20 [id number]       Devices    LINES / TUBES:      Continuous Infusions: . sodium chloride 1,000  mL (03/25/20 0737)  . cefTRIAXone (ROCEPHIN)  IV 1 g (03/25/20 [id number])     Objective: Vitals:   03/25/20 0724 03/25/20 0800 03/25/20 0823 03/25/20 1106  BP: 103/83     Pulse: 90   93  Resp: 16 (!) 22 (!) 23 18  Temp: 98.3 F (36.8 C)     TempSrc: Axillary     SpO2: 96%  97% 97%  Weight:      Height:        Intake/Output Summary (Last 24 hours) at 03/25/2020 1108 Last data filed at 03/25/2020 0737 Gross per 24 hour  Intake 500 ml  Output --  Net 500 ml   Filed Weights   03/24/20 0559 03/24/20 1536 03/25/20 0500  Weight: 70.6 kg 66.4 kg 68.9 kg   Physical  Exam:  General: A/O x4, positive acute respiratory distress (improving) Eyes: negative scleral hemorrhage, negative anisocoria, negative icterus ENT: Negative Runny nose, negative gingival bleeding, Neck:  Negative scars, masses, torticollis, lymphadenopathy, JVD Lungs: Tachypnea positive expiratory wheezes, improving.  Negative crackles Cardiovascular: Tachycardic without murmur gallop or rub normal S1 and S2 Abdomen: negative abdominal pain, nondistended, positive soft, bowel sounds, no rebound, no ascites, no appreciable mass Extremities: No significant cyanosis, clubbing, or edema bilateral lower extremities Skin: Negative rashes, lesions, ulcers Psychiatric:  Negative depression, negative anxiety, negative fatigue, negative mania  Central nervous system:  Cranial nerves II through XII intact, tongue/uvula midline, all extremities muscle strength 5/5, sensation intact throughout, negative dysarthria, negative expressive aphasia, negative receptive aphasia. Physical Exam:       Data Reviewed: Care during the described time interval was provided by me .  I have reviewed this patient's available data, including medical history, events of note, physical examination, and all test results as part of my evaluation.   CBC: Recent Labs  Lab 03/21/20 0645 03/22/20 0233 03/23/20 0218 03/24/20 0346 03/25/20 0226  WBC 8.6 8.9 7.1 12.5* 11.3*  HGB 9.2* 8.7* 8.5* 8.3* 7.9*  HCT 28.6* 27.3* 26.6* 26.0* 25.2*  MCV 93.8 93.8 93.7 93.9 93.7  PLT 306 294 285 357 [id number]   Basic Metabolic Panel: Recent Labs  Lab 03/21/20 0645 03/22/20 0233 03/23/20 0218 03/24/20 0346 03/25/20 0226  NA 138 137 137 138 135  K 3.8 3.9 3.4* 4.4 4.7  CL 106 105 106 110 107  CO2 19* 19* 18* 16* 16*  GLUCOSE 107* 106* 114* 133* 146*  BUN 20 18 17  24* 34*  CREATININE 1.29* 1.21* 1.24* 1.25* 1.43*  CALCIUM 8.5* 8.7* 8.3* 9.0 8.9  MG 2.0 2.1 2.2 2.1 2.1  PHOS  --  3.6 3.3 4.1 5.3*   GFR: Estimated Creatinine  Clearance: 40.4 mL/min (A) (by C-G formula based on SCr of 1.43 mg/dL (H)). Liver Function Tests: Recent Labs  Lab 03/20/20 0826 03/22/20 0233 03/23/20 0218 03/24/20 0346 03/25/20 0226  AST 18 30 57* 41 265*  ALT 11 17 39 41 181*  ALKPHOS 56 55 64 68 59  BILITOT 0.7 0.7 0.4 0.5 0.7  PROT 6.2* 6.3* 6.1* 6.6 6.1*  ALBUMIN 3.1* 2.8* 2.7* 2.7* 2.7*   No results for input(s): LIPASE, AMYLASE in the last 168 hours. No results for input(s): AMMONIA in the last 168 hours. Coagulation Profile: No results for input(s): INR, PROTIME in the last 168 hours. Cardiac Enzymes: Recent Labs  Lab 03/20/20 0810  CKTOTAL 205   BNP (last 3 results) No results for input(s): PROBNP in the last 8760 hours. HbA1C: No results for input(s): HGBA1C in the  last 72 hours. CBG: No results for input(s): GLUCAP in the last 168 hours. Lipid Profile: No results for input(s): CHOL, HDL, LDLCALC, TRIG, CHOLHDL, LDLDIRECT in the last 72 hours. Thyroid Function Tests: No results for input(s): TSH, T4TOTAL, FREET4, T3FREE, THYROIDAB in the last 72 hours. Anemia Panel: No results for input(s): VITAMINB12, FOLATE, FERRITIN, TIBC, IRON, RETICCTPCT in the last 72 hours. Urine analysis:    Component Value Date/Time   COLORURINE YELLOW 03/20/2020 1600   APPEARANCEUR CLEAR 03/20/2020 1600   LABSPEC 1.026 03/20/2020 1600   PHURINE 5.0 03/20/2020 1600   GLUCOSEU NEGATIVE 03/20/2020 1600   HGBUR MODERATE (A) 03/20/2020 1600   BILIRUBINUR NEGATIVE 03/20/2020 1600   KETONESUR NEGATIVE 03/20/2020 1600   PROTEINUR NEGATIVE 03/20/2020 1600   NITRITE NEGATIVE 03/20/2020 1600   LEUKOCYTESUR SMALL (A) 03/20/2020 1600   Sepsis Labs: @LABRCNTIP (procalcitonin:4,lacticidven:4)  ) Recent Results (from the past 240 hour(s))  Respiratory Panel by RT PCR (Flu A&B, Covid) -     Status: None   Collection Time: 03/20/20  5:54 AM  Result Value Ref Range Status   SARS Coronavirus 2 by RT PCR NEGATIVE NEGATIVE Final     Comment: (NOTE) SARS-CoV-2 target nucleic acids are NOT DETECTED. The SARS-CoV-2 RNA is generally detectable in upper respiratoy specimens during the acute phase of infection. The lowest concentration of SARS-CoV-2 viral copies this assay can detect is 131 copies/mL. A negative result does not preclude SARS-Cov-2 infection and should not be used as the sole basis for treatment or other patient management decisions. A negative result may occur with  improper specimen collection/handling, submission of specimen other than nasopharyngeal swab, presence of viral mutation(s) within the areas targeted by this assay, and inadequate number of viral copies (<131 copies/mL). A negative result must be combined with clinical observations, patient history, and epidemiological information. The expected result is Negative. Fact Sheet for Patients:  PinkCheek.be Fact Sheet for Healthcare Providers:  GravelBags.it This test is not yet ap proved or cleared by the [place] FDA and  has been authorized for detection and/or diagnosis of SARS-CoV-2 by FDA under an Emergency Use Authorization (EUA). This EUA will remain  in effect (meaning this test can be used) for the duration of the COVID-19 declaration under Section 564(b)(1) of the Act, 21 U.S.C. section 360bbb-3(b)(1), unless the authorization is terminated or revoked sooner.    Influenza A by PCR NEGATIVE NEGATIVE Final   Influenza B by PCR NEGATIVE NEGATIVE Final    Comment: (NOTE) The Xpert Xpress SARS-CoV-2/FLU/RSV assay is intended as an aid in  the diagnosis of influenza from Nasopharyngeal swab specimens and  should not be used as a sole basis for treatment. Nasal washings and  aspirates are unacceptable for Xpert Xpress SARS-CoV-2/FLU/RSV  testing. Fact Sheet for Patients: PinkCheek.be Fact Sheet for Healthcare  Providers: GravelBags.it This test is not yet approved or cleared by the [place] FDA and  has been authorized for detection and/or diagnosis of SARS-CoV-2 by  FDA under an Emergency Use Authorization (EUA). This EUA will remain  in effect (meaning this test can be used) for the duration of the  Covid-19 declaration under Section 564(b)(1) of the Act, 21  U.S.C. section 360bbb-3(b)(1), unless the authorization is  terminated or revoked. Performed at [facility], [place] [address] [place], [place] [address]   Respiratory Panel by PCR     Status: None   Collection Time: 03/20/20  5:54 AM   Specimen: Nasopharyngeal Swab; Respiratory  Result Value Ref Range  Status   Adenovirus NOT DETECTED NOT DETECTED Final   Coronavirus 229E NOT DETECTED NOT DETECTED Final    Comment: (NOTE) The Coronavirus on the Respiratory Panel, DOES NOT test for the novel  Coronavirus (2019 nCoV)    Coronavirus HKU1 NOT DETECTED NOT DETECTED Final   Coronavirus NL63 NOT DETECTED NOT DETECTED Final   Coronavirus OC43 NOT DETECTED NOT DETECTED Final   Metapneumovirus NOT DETECTED NOT DETECTED Final   Rhinovirus / Enterovirus NOT DETECTED NOT DETECTED Final   Influenza A NOT DETECTED NOT DETECTED Final   Influenza B NOT DETECTED NOT DETECTED Final   Parainfluenza Virus 1 NOT DETECTED NOT DETECTED Final   Parainfluenza Virus 2 NOT DETECTED NOT DETECTED Final   Parainfluenza Virus 3 NOT DETECTED NOT DETECTED Final   Parainfluenza Virus 4 NOT DETECTED NOT DETECTED Final   Respiratory Syncytial Virus NOT DETECTED NOT DETECTED Final   Bordetella pertussis NOT DETECTED NOT DETECTED Final   Chlamydophila pneumoniae NOT DETECTED NOT DETECTED Final   Mycoplasma pneumoniae NOT DETECTED NOT DETECTED Final    Comment: Performed at [facility], [place] [address] [place], [place] [address]  Blood Culture (routine x 2)     Status: None   Collection Time: 03/20/20  7:31 AM    Specimen: BLOOD RIGHT FOREARM  Result Value Ref Range Status   Specimen Description BLOOD RIGHT FOREARM  Final   Special Requests   Final    BOTTLES DRAWN AEROBIC AND ANAEROBIC Blood Culture adequate volume   Culture   Final    NO GROWTH 5 DAYS Performed at [facility], [place] [address] [place], [place] [address]    Report Status 03/25/2020 FINAL  Final  Blood Culture (routine x 2)     Status: None   Collection Time: 03/20/20  8:06 AM   Specimen: BLOOD LEFT ARM  Result Value Ref Range Status   Specimen Description BLOOD LEFT ARM  Final   Special Requests   Final    BOTTLES DRAWN AEROBIC AND ANAEROBIC Blood Culture adequate volume   Culture   Final    NO GROWTH 5 DAYS Performed at [facility], [place] [address] [place], [place] [address]    Report Status 03/25/2020 FINAL  Final  MRSA PCR Screening     Status: None   Collection Time: 03/20/20  3:52 PM   Specimen: Nasopharyngeal  Result Value Ref Range Status   MRSA by PCR NEGATIVE NEGATIVE Final    Comment:        The GeneXpert MRSA Assay (FDA approved for NASAL specimens only), is one component of a comprehensive MRSA colonization surveillance program. It is not intended to diagnose MRSA infection nor to guide or monitor treatment for MRSA infections. Performed at [facility], [place] [address], [place], [place] [address]   Gastrointestinal Panel by PCR , Stool     Status: None   Collection Time: 03/21/20  1:50 PM   Specimen: STOOL  Result Value Ref Range Status   Campylobacter species NOT DETECTED NOT DETECTED Final   Plesimonas shigelloides NOT DETECTED NOT DETECTED Final   Salmonella species NOT DETECTED NOT DETECTED Final   Yersinia enterocolitica NOT DETECTED NOT DETECTED Final   Vibrio species NOT DETECTED NOT DETECTED Final   Vibrio cholerae NOT DETECTED NOT DETECTED Final   Enteroaggregative E coli (EAEC) NOT DETECTED NOT DETECTED Final   Enteropathogenic E coli (EPEC) NOT DETECTED NOT DETECTED  Final   Enterotoxigenic E coli (ETEC)  NOT DETECTED NOT DETECTED Final   Shiga like toxin producing E coli (STEC) NOT DETECTED NOT DETECTED Final   Shigella/Enteroinvasive E coli (EIEC) NOT DETECTED NOT DETECTED Final   Cryptosporidium NOT DETECTED NOT DETECTED Final   Cyclospora cayetanensis NOT DETECTED NOT DETECTED Final   Entamoeba histolytica NOT DETECTED NOT DETECTED Final   Giardia lamblia NOT DETECTED NOT DETECTED Final   Adenovirus F40/41 NOT DETECTED NOT DETECTED Final   Astrovirus NOT DETECTED NOT DETECTED Final   Norovirus GI/GII NOT DETECTED NOT DETECTED Final   Rotavirus A NOT DETECTED NOT DETECTED Final   Sapovirus (I, II, IV, and V) NOT DETECTED NOT DETECTED Final    Comment: Performed at [facility], [place] [place], [place] [address]  C Difficile Quick Screen w PCR reflex     Status: None   Collection Time: 03/21/20  1:50 PM   Specimen: STOOL  Result Value Ref Range Status   C Diff antigen NEGATIVE NEGATIVE Final   C Diff toxin NEGATIVE NEGATIVE Final   C Diff interpretation No C. difficile detected.  Final    Comment: Performed at [facility], [place] [address] [place], [place] [address]         Radiology Studies: DG CHEST PORT 1 VIEW  Result Date: 03/24/2020 CLINICAL DATA:  Community-acquired pneumonia. EXAM: PORTABLE CHEST 1 VIEW COMPARISON:  March 20, 2020. FINDINGS: The heart size and mediastinal contours are within normal limits. No pneumothorax or pleural effusion is noted. Stable bilateral interstitial densities are noted concerning for edema or possibly atypical infection. The visualized skeletal structures are unremarkable. IMPRESSION: Stable bilateral interstitial densities are noted concerning for edema or possibly atypical infection. Electronically Signed   By: Marijo Conception M.D.   On: 03/24/2020 08:52        Scheduled Meds: . aspirin EC  81 mg Oral Daily  . budesonide (PULMICORT) nebulizer solution  0.25 mg Nebulization  BID  . carvedilol  3.125 mg Oral BID WC  . doxycycline  100 mg Oral Q12H  . heparin  5,000 Units Subcutaneous Q8H  . levalbuterol  0.63 mg Nebulization QID  . losartan  25 mg Oral Daily  . methylPREDNISolone (SOLU-MEDROL) injection  60 mg Intravenous Q24H  . nicotine  21 mg Transdermal Daily  . rosuvastatin  20 mg Oral Daily  . sodium chloride flush  3 mL Intravenous Q12H  . sodium chloride flush  3 mL Intravenous Q12H  . sodium chloride flush  3 mL Intravenous Q12H   Continuous Infusions: . sodium chloride 1,000 mL (03/25/20 0737)  . cefTRIAXone (ROCEPHIN)  IV 1 g (03/25/20 [id number])     LOS: 5 days   The patient is critically ill with multiple organ systems failure and requires high complexity decision making for assessment and support, frequent evaluation and titration of therapies, application of advanced monitoring technologies and extensive interpretation of multiple databases. Critical Care Time devoted to patient care services described in this note  Time spent: 40 minutes     Jamarquis Crull, Geraldo Docker, MD [facility] Pager [phone]  If 7PM-7AM, please contact night-coverage [email or web address] Password [facility] 03/25/2020, 11:08 AM

## 2020-03-25 NOTE — Progress Notes (Addendum)
Respiratory therapist came to room to collect exp sputum assessment w/ rflex---patient unable to provide sample right now, sputum cup with label on it has been placed at bedside, pt to let nurse know when this is collected in cup, we need to send to lab

## 2020-03-25 NOTE — Progress Notes (Signed)
Progress Note  Patient Name: Anita [name] Date of Encounter: 03/25/2020  Primary Cardiologist: Buford Dresser, MD   Subjective   Spent >45 minutes today with patient and her daughter in the room. Many questions about steps once she leaves the hospital, all options reviewed.  She continues to cough and bring up sputum. Breathing somewhat better today.  Blood pressures have been 88S systolic. She has only been in bed, but has felt some lightheadedness. She says she had this intermittently before as well, so cannot be sure if it is due to the medication. No chest pain.  Inpatient Medications    Scheduled Meds: . aspirin EC  81 mg Oral Daily  . budesonide (PULMICORT) nebulizer solution  0.25 mg Nebulization BID  . carvedilol  6.25 mg Oral BID WC  . doxycycline  100 mg Oral Q12H  . heparin  5,000 Units Subcutaneous Q8H  . levalbuterol  0.63 mg Nebulization BID  . methylPREDNISolone (SOLU-MEDROL) injection  60 mg Intravenous Q24H  . nicotine  21 mg Transdermal Daily  . rosuvastatin  20 mg Oral Daily  . sodium chloride flush  3 mL Intravenous Q12H  . sodium chloride flush  3 mL Intravenous Q12H  . sodium chloride flush  3 mL Intravenous Q12H   Continuous Infusions: . sodium chloride 1,000 mL (03/25/20 0737)  . cefTRIAXone (ROCEPHIN)  IV 1 g (03/25/20 5733)   PRN Meds: sodium chloride, acetaminophen **OR** acetaminophen, diphenhydrAMINE, levalbuterol, methocarbamol, sodium chloride flush, traZODone   Vital Signs    Vitals:   03/25/20 0800 03/25/20 0823 03/25/20 1106 03/25/20 1200  BP:    (!) 91/57  Pulse:   93   Resp: (!) 22 (!) _0 Temp:    97.8 F (36.6 C)  TempSrc:    Oral  SpO2:  97% 97%   Weight:      Height:        Intake/Output Summary (Last 24 hours) at 03/25/2020 1437 Last data filed at 03/25/2020 1200 Gross per 24 hour  Intake 580 ml  Output --  Net 580 ml   Last 3 Weights 03/25/2020 03/24/2020 03/24/2020  Weight (lbs) 151 lb 14.4 oz  146 lb 6.2 oz 155 lb 10.3 oz  Weight (kg) 68.9 kg 66.4 kg 70.6 kg      Telemetry    Sinus rhythm - Personally Reviewed  ECG    4/15 Sinus tachycardia - Personally Reviewed  Physical Exam   GEN: in no acute distress HEENT: Normal, moist mucous membranes NECK: No JVD CARDIAC: regular rhythm, normal S1 and S2, no rubs or gallops. 2/6 HS murmur. VASCULAR: Radial pulses 2+ bilaterally. Cath site c/d/i RESPIRATORY:  Diffusely coarse ABDOMEN: Soft, non-tender, non-distended MUSCULOSKELETAL:  Moves all 4 limbs independently SKIN: Warm and dry, no edema NEUROLOGIC:  nonfocal PSYCHIATRIC:  Normal affect   Labs    High Sensitivity Troponin:   Recent Labs  Lab 03/20/20 0232 03/20/20 0441  TROPONINIHS 2,581* 2,513*      Chemistry Recent Labs  Lab 03/23/20 0218 03/24/20 0346 03/25/20 0226  NA 137 138 135  K 3.4* 4.4 4.7  CL 106 110 107  CO2 18* 16* 16*  GLUCOSE 114* 133* 146*  BUN 17 24* 34*  CREATININE 1.24* 1.25* 1.43*  CALCIUM 8.3* 9.0 8.9  PROT 6.1* 6.6 6.1*  ALBUMIN 2.7* 2.7* 2.7*  AST 57* 41 265*  ALT 39 41 181*  ALKPHOS 64 68 59  BILITOT 0.4 0.5 0.7  GFRNONAA 48* 48* 41*  GFRAA 56* 55* 47*  ANIONGAP _0 Hematology Recent Labs  Lab 03/23/20 0218 03/24/20 0346 03/25/20 0226  WBC 7.1 12.5* 11.3*  RBC 2.84* 2.77* 2.69*  HGB 8.5* 8.3* 7.9*  HCT 26.6* 26.0* 25.2*  MCV 93.7 93.9 93.7  MCH 29.9 30.0 29.4  MCHC 32.0 31.9 31.3  RDW 15.2 15.5 15.6*  PLT 285 357 342    BNP Recent Labs  Lab 03/20/20 0441  BNP 1,255.6*     DDimer No results for input(s): DDIMER in the last 168 hours.   Radiology    DG CHEST PORT 1 VIEW  Result Date: 03/24/2020 CLINICAL DATA:  Community-acquired pneumonia. EXAM: PORTABLE CHEST 1 VIEW COMPARISON:  March 20, 2020. FINDINGS: The heart size and mediastinal contours are within normal limits. No pneumothorax or pleural effusion is noted. Stable bilateral interstitial densities are noted concerning for edema or  possibly atypical infection. The visualized skeletal structures are unremarkable. IMPRESSION: Stable bilateral interstitial densities are noted concerning for edema or possibly atypical infection. Electronically Signed   By: Marijo Conception M.D.   On: 03/24/2020 08:52    Cardiac Studies   Echo 03/20/20 1. Left ventricular ejection fraction, by estimation, is 40 to 45%. The  left ventricle has mildly decreased function. The left ventricle  demonstrates global hypokinesis. The left ventricular internal cavity size  was mildly dilated. Left ventricular  diastolic function could not be evaluated.  2. Right ventricular systolic function is normal. The right ventricular  size is normal. There is moderately elevated pulmonary artery systolic  pressure.  3. Left atrial size was mildly dilated.  4. Moderate mitral subvalvular thickening/fibrosis.  5. The mitral valve is rheumatic. Moderate to severe mitral valve  regurgitation. Mild mitral stenosis. The mean mitral valve gradient is 7.3  mmHg with average heart rate of 105 bpm.  6. The aortic valve is normal in structure. Aortic valve regurgitation is  not visualized.  7. The inferior vena cava is dilated in size with <50% respiratory  variability, suggesting right atrial pressure of 15 mmHg.  Cath 03/23/20  Hemodynamics: LV end diastolic pressure is moderately elevated.  ----Coronary Angiography-----  Ost RCA to Dist RCA lesion is 100% stenosed.-The PDA and PL system fills via faint collaterals from the AV groove LCx and LAD septals.  Mid LAD-1 lesion is 60% stenosed. Mid LAD-2 lesion is 50% stenosed. Dist LAD lesion is 55% stenosed with 70% stenosed side branch in 2nd Diag.  1st Diag lesion is 70% stenosed.  Ost Cx to Prox Cx lesion is 45% stenosed. Prox-MID Cx lesion is 65% stenosed.    MODERATE-SEVERE THREE-VESSEL CAD: ? 100% proximal RCA occlusion with left-to-right collaterals faintly filling PDA and PL system (AV groove  LCx-PL and LAD septal-PDA) ? Diffuse moderate mid LAD disease with 60% to 50% stenosis. ? Tandem 50% and 65% proximal and mid LCx with the 65% lesion being the most significant lesion.  Moderately elevated LVEDP of 18-20 mmHg  Given the extent of disease in the LAD and LCx, neither lesion is very amenable to PCI, and RCA is chronically occluded.  Given her comorbidities, I do not think she would be a good CABG candidate especially in light of the fact that she would likely require valve surgery as well.  She would not recover well.  Recommendations aggressive medical management.  Patient Profile     60 y.o. female with PMH multiple sclerosis presented with acute respiratory distress and fevers. Echo with mildly reduced  EF, elevated troponins, but no chest pain. Found to have chronic diffuse moderate CAD with CTO RCA that fills by collaterals.  Assessment & Plan    Respiratory distress, pulmonary infiltrates, fever, elevated inflammatory markers: -Blood Cultures negative thus far -respiratory panel negative.  -UA mildly abnormal but culture not positive at this time.  -White count remains normal.  -ESR 64, CRP 5.9 -c diff negative, diarrhea as well, since 4/8 per her report -highly suspicious for inflammatory process -sinus tachycardia, tachypnea likely reactive to this process -no PE on CT -initial LVEDP normal, then slightly elevated on cath. Out of proportion to her symptoms -still short of breath with frequent cough, with thick beige sputum with small streaks of blood. May need to consult pulmonology given lack of clear etiology, defer to primary team  Abnormal EF (40-45%), no prior. Cath with diffuse moderate disease and CTO RCA, not acute. Suggests ischemic cardiomyopathy. -started aspirin, high intensity rosuvastatin this admission -stopped sumatriptan given CAD. She reports she has not taken in a long time -already on low dose carvedilol -started ARB 4/17, slight bump in Cr  but within expected amount. BP has been running low with some lightheadedness. Given this, will stop ARB. -PRN diuresis, no JVD or edema today  Moderate to severe mitral regurgitation with rheumatic appearing mitral valve, without severe mitral stenosis -thickened leaflets with rheumatic movement -MVA >3 based on pressure half time -no indication for intervention at this time.  Acute kidney injury, resolved -peak Cr 1.52 on admission, today 1.43, up slightly likely due to start of ARB  [facility] will sign off.   Medication Recommendations:   NEW: Aspirin 81 mg daily Carvedilol 6.25 mg BID Rosuvastatin 20 mg daily  STOP: Sumatriptan All NSAIDs  Other recommendations (labs, testing, etc): none Follow up as an outpatient: We will arrange for her to be followed up with me in several weeks.  For questions or updates, please contact [place] Please consult [email or web address] for contact info under     Signed, Buford Dresser, MD  03/25/2020, 2:37 PM

## 2020-03-25 NOTE — Plan of Care (Signed)

## 2020-03-26 ENCOUNTER — Inpatient Hospital Stay (HOSPITAL_COMMUNITY): Payer: Medicare Other

## 2020-03-26 LAB — EXPECTORATED SPUTUM ASSESSMENT W GRAM STAIN, RFLX TO RESP C

## 2020-03-26 LAB — CBC WITH DIFFERENTIAL/PLATELET
Abs Immature Granulocytes: 0.17 10*3/uL — ABNORMAL HIGH (ref 0.00–0.07)
Basophils Absolute: 0 10*3/uL (ref 0.0–0.1)
Basophils Relative: 0 %
Eosinophils Absolute: 0 10*3/uL (ref 0.0–0.5)
Eosinophils Relative: 0 %
HCT: 26.9 % — ABNORMAL LOW (ref 36.0–46.0)
Hemoglobin: 8.4 g/dL — ABNORMAL LOW (ref 12.0–15.0)
Immature Granulocytes: 1 %
Lymphocytes Relative: 12 %
Lymphs Abs: 1.9 10*3/uL (ref 0.7–4.0)
MCH: 29.9 pg (ref 26.0–34.0)
MCHC: 31.2 g/dL (ref 30.0–36.0)
MCV: 95.7 fL (ref 80.0–100.0)
Monocytes Absolute: 0.6 10*3/uL (ref 0.1–1.0)
Monocytes Relative: 4 %
Neutro Abs: 13.2 10*3/uL — ABNORMAL HIGH (ref 1.7–7.7)
Neutrophils Relative %: 83 %
Platelets: 416 10*3/uL — ABNORMAL HIGH (ref 150–400)
RBC: 2.81 MIL/uL — ABNORMAL LOW (ref 3.87–5.11)
RDW: 15.9 % — ABNORMAL HIGH (ref 11.5–15.5)
WBC: 15.9 10*3/uL — ABNORMAL HIGH (ref 4.0–10.5)
nRBC: 0.2 % (ref 0.0–0.2)

## 2020-03-26 LAB — PROCALCITONIN: Procalcitonin: 0.29 ng/mL

## 2020-03-26 LAB — COMPREHENSIVE METABOLIC PANEL
ALT: 225 U/L — ABNORMAL HIGH (ref 0–44)
AST: 203 U/L — ABNORMAL HIGH (ref 15–41)
Albumin: 2.8 g/dL — ABNORMAL LOW (ref 3.5–5.0)
Alkaline Phosphatase: 84 U/L (ref 38–126)
Anion gap: 13 (ref 5–15)
BUN: 54 mg/dL — ABNORMAL HIGH (ref 6–20)
CO2: 16 mmol/L — ABNORMAL LOW (ref 22–32)
Calcium: 9 mg/dL (ref 8.9–10.3)
Chloride: 107 mmol/L (ref 98–111)
Creatinine, Ser: 1.53 mg/dL — ABNORMAL HIGH (ref 0.44–1.00)
GFR calc Af Amer: 43 mL/min — ABNORMAL LOW (ref >60)
GFR calc non Af Amer: 37 mL/min — ABNORMAL LOW (ref >60)
Glucose, Bld: 128 mg/dL — ABNORMAL HIGH (ref 70–99)
Potassium: 4.9 mmol/L (ref 3.5–5.1)
Sodium: 136 mmol/L (ref 135–145)
Total Bilirubin: 0.2 mg/dL — ABNORMAL LOW (ref 0.3–1.2)
Total Protein: 6.2 g/dL — ABNORMAL LOW (ref 6.5–8.1)

## 2020-03-26 LAB — PHOSPHORUS: Phosphorus: 4.9 mg/dL — ABNORMAL HIGH (ref 2.5–4.6)

## 2020-03-26 LAB — MAGNESIUM: Magnesium: 2.4 mg/dL (ref 1.7–2.4)

## 2020-03-26 MED ORDER — ADULT MULTIVITAMIN W/MINERALS CH
1.0000 | ORAL_TABLET | Freq: Every day | ORAL | Status: DC
  Administered 2020-03-26 – 2020-03-28 (×3): 1 via ORAL
  Filled 2020-03-26 (×3): qty 1

## 2020-03-26 MED ORDER — ENSURE ENLIVE PO LIQD
237.0000 mL | Freq: Two times a day (BID) | ORAL | Status: DC
  Administered 2020-03-27 – 2020-03-28 (×3): 237 mL via ORAL

## 2020-03-26 NOTE — Progress Notes (Addendum)
PROGRESS NOTE    Anita [name]  [id number] DOB: November 11, 1961 DOA: 03/20/2020 PCP: [name], NP   Brief Narrative:  Anita [name] is a 59 y.o. WF PMHx Multiple Sclerosis with Optic neuritis, RIGHT ear hearing loss, migraine headache, obese, GERD, arthritis, tobacco abuse, and allergies   Presents with complaints of shortness of breath.  Reports having intermittent episodes of shortness of breath lasting approximately 2 minutes before self resolving over the last 3-4 days.  During episode she would have palpitations, diaphoresis, and feel anxious.  Symptoms would gradually resolve on their own.  Over this weekend she also has reported having diarrhea 3-4 episodes per day, nausea, and mostly nonproductive cough.  Diarrhea was noted to be yellowish-orange in she did not notice any blood.  She had tried taking Mucinex, but thought that it may have been causing her to have diarrhea.  Denied having any recent sick contacts or knowledge of chest pain, abdominal pain, leg swelling, weight gain, dysuria, or vomiting,.  However, last night while trying to go to bed developed severe shortness of breath which did not go away. Laying seem to worsen symptoms.   Reports smoking half a pack cigarettes per day on average.  She has been off of treatment for MS for at least 10 years now because she felt that they did not help.  Ambulates with difficulty using a walker.  She has been muscle spasms and utilizes Tylenol to treat symptoms.  Patient notes family history significant for mother who had a heart attack at age 25.   ED Course: Upon admission into the emergency department patient was seen to be afebrile with pulse 113-153, respirations 23-30, blood pressures maintained, and O2 saturations 93-98% on room air.  Labs significant for WBC 8.1, hemoglobin 10.3, CO2 16, BUN 21, creatinine 1.3, glucose 195, anion gap 18, troponin 2581-> 2513, and BNP 1255.6.  Chest x-ray showed diffuse  interstitial process concerning for atypical pneumonia versus edema.  Patient had been given heparin bolus of 4000 units, Rocephin azithromycin, and 1 L normal saline IV fluids.  Cardiology has been consulted due to the elevated troponin.  TRH called to admit.   Subjective: 4/19 A/O x4, negative CP, positive S OB, positive productive cough with use of flutter valve.    Assessment & Plan:   Principal Problem:   Acute respiratory distress Active Problems:   TOBACCO ABUSE   Multiple sclerosis (HCC)   Acute respiratory failure with hypoxia (HCC)   Prolonged QT interval   NSTEMI (non-ST elevated myocardial infarction) (HCC)   SIRS (systemic inflammatory response syndrome) (HCC)   Normocytic anemia   Pneumonia   AKI (acute kidney injury) ([place])   Respiratory distress   Atypical pneumonia   Tobacco abuse   Elevated troponin   Acute systolic CHF (congestive heart failure) (HCC)   Pulmonary hypertension (HCC)   Retroperitoneal mass   Insomnia  Acute respiratory failure with hypoxia -Xopenex QID -Flutter valve -Incentive spirometer -Complete 7-day course antibiotics.  Azithromycin discontinued secondary to prolonged QT interval. -Discussed case with pharmacy and patient has been on high dose of steroids in the past for her MS.  Solu-Medrol 60 mg daily. -->  DC 4/19 -4/16 Pulmicort nebulizer BID -4/17 PCXR; bilateral interstitial densities see results below  -Continuous pulse ox -4/17 maintain patient on 2 L O2 at all times desats whenever she talks, most likely desats when sleeping. -Titrate O2 to maintain SPO2> 94% -4/19 ambulatory SPO2 pending  Atypical pneumonia -Patient CT angiogram 4/13  consistent with viral/atypical pneumonia see results below -See acute respiratory distress -All viral panels negative see results below -4/19 PCXR pending -Procalcitonin and lactic acid pending   SIRS:  -Initially presented tachycardia, tachypnea, WBC were WNL. -Most likely respiratory  cause. -Blood cultures NGTD   Tobacco abuse -Patient was counseled on need to absolutely discontinue smoking forever.  Had been smoking 1/2 PPD since 59 years old -Nicotine patch  Elevated troponin -Initially thought to be demand ischemia however echocardiogram shows acute systolic CHF and acute pulmonary HTN. -4/14 cardiology notified concerning findings awaiting recommendations on if cardiac catheterization to occur. -Cardiology recommends [facility] this admission but would like her to be fever free at least 48 hours prior to procedure. -We will monitor patient's symptoms to ensure she meets cardiology parameters for cardiac catheterization. -4/15 patient has been fever free for 48 hours, normal WBC.  Should meet criteria for cardiology to perform cardiac catheterization.  Acute systolic CHF/NSTEMI/severe three-vessel disease -EF 40 to 45% see echocardiogram results below -Strict in and out +2.6 L -Daily weight Filed Weights   03/24/20 1536 03/25/20 0500 03/26/20 0500  Weight: 66.4 kg 68.9 kg 70 kg  -See elevated troponin -4/18 increase Coreg 6.25 mg BID, HR still hovering around 100  -4/17 losartan 25 mg daily -4/16 cardiac catheterization showed severe three-vessel disease, given patient's other multiple medical conditions cardiology feels patient would not survive CABG see results below -Aggressive medical management -4/18 Nutrition; patient requests to speak with nutritionist concerning cardiac diet at home -4/18 cardiology signed off -Cardiology has arranged home health RN for CHF -Follow-up with NP Alma Friendly 4/27 @1020   Acute pulmonary hypertension -See CHF/elevated troponin  Acute kidney injury vs CKD -Previous creatinine= 1.08 on 01/03/2011 -Most likely patient's renal function has been worsening over the years and she has not known this. Recent Labs  Lab 03/22/20 0233 03/23/20 0218 03/24/20 0346 03/25/20 0226 03/26/20 0235  CREATININE 1.21* 1.24* 1.25* 1.43*  1.53*  -Slight increase in creatinine most likely secondary to starting ARB  Prolonged QT interval: Acute.  QTc initially 586. -Correct any electrolyte abnormalities -Avoid any QT prolonging medication -Recheck EKG in a.m.   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 -Was seen by Dr. Eston Esters oncology in 2007; no longer practices -4/18 discussed case with NP Katie [facility] feels that patient probably not a surgical candidate, best course of action would be to reestablish care with Oncologist first if patient desires and then come up with plan..   -4/18 after explaining options patient not interested in pursuing further diagnosis/therapeutic options.  Metabolic acidosis with elevated anion gap -4/14 resolved  Multiple sclerosis:  -Patient reports that she has been off of any kind of treatment over the last 10 years as she felt they were having no effect.  Patient reports that she has been using Tylenol and ibuprofen. -Robaxin as needed for muscle spasms   Normocytic anemia: Acute.   -Hemoglobin 10.3 g/dL on admission, but previously noted to be within normal limits. Recent Labs  Lab 03/22/20 0233 03/23/20 0218 03/24/20 0346 03/25/20 0226 03/26/20 0235  HGB 8.7* 8.5* 8.3* 7.9* 8.4*  -Transfuse for hemoglobin<7  Diarrhea  -C. difficile negative -GI panel negative  Insomnia -Trazodone 50 mg QHS PRN  Hypokalemia --K-Dur 30meq   HLD -Rosuvastatin 20 mg daily per cardiology.  Goals of care -4/19 PT/OT; patient with severe three-vessel cardiac disease as well as emphysema/COPD, multiple other medical problems evaluate for CIR vs SNF  DVT prophylaxis: Subcu heparin Code Status:  Family Communication: 4/18 [name] (daughter) at bedside counseled on plan of care answered all questions Disposition Plan:  1.  Where the patient is from 2.  Anticipated d/c place. 3.  Barriers to d/c per  cardiology   Consultants:  Cardiology Phone consult [facility] NP Katie   Procedures/Significant Events:  4/13 Echocardiogram;Left Ventricle: LVEF=40 -45%.-Global hypokinesis.  -Left ventricular diastolic function could not be evaluated due to mitral stenosis.  Right Ventricle:  moderately elevated pulmonary artery systolic pressure.  The tricuspid regurgitant velocity is 3.30 m/s, and with an assumed right atrial pressure of 15 mmHg, the estimated right ventricular systolic pressure is 0000000 mmHg.  Mitral Valve: Rheumatic. There is moderate thickening of the mitral valve leaflet(s). Moderate mitral subvalvular thickening/fibrosis. Moderate to severe mitral valve regurgitation, with  posteriorly-directed jet. Mild mitral valve stenosis. The mean mitral valve gradient is 7.3 mmHg with average heart  rate of 105 bpm.  Venous: The inferior vena cava is dilated in size with less than 50% respiratory variability, suggesting right atrial pressure of 15 mmHg.  4/14 CTA chest PE protocol;-ground-glass opacities with history favoring atypical/viral pneumonia. -Small pleural effusions and interlobular septal thickening not typically associated with viral pneumonia, findings could reflect atypical edema or failure superimposed on infection. -Left retroperitoneal mass, ganglioneuroma at 2007 pathology.There is residual or recurrent mass adjacent to the retroperitoneal clips, 5.6 cm. Was the patient is prior resection subtotal? 4/16 cardiac catheterization;- LV end diastolic pressure is moderately elevated. -Ost RCA to Dist RCA lesion is 100% stenosed. - PDA and PL system fills via faint collaterals from the AV groove LCx and LAD septals. -Mid LAD-1 lesion is 60% stenosed. Mid LAD-2 lesion is 50% stenosed. Dist LAD lesion is 55% stenosed with 70% stenosed side branch in 2nd Diag. -1st Diag lesion is 70% stenosed. -Ost Cx to Prox Cx lesion is 45% stenosed. Prox-MID Cx lesion is 65% stenosed.     MODERATE-SEVERE THREE-VESSEL CAD: ? 100% proximal RCA occlusion with left-to-right collaterals faintly filling PDA and PL system (AV groove LCx-PL and LAD septal-PDA) ? Diffuse moderate mid LAD disease with 60% to 50% stenosis. ? Tandem 50% and 65% proximal and mid LCx with the 65% lesion being the most significant lesion.  Moderately elevated LVEDP of 18-20 mmHg  Given the extent of disease in the LAD and LCx, neither lesion is very amenable to PCI, and RCA is chronically occluded.  Given her comorbidities, I do not think she would be a good CABG candidate especially in light of the fact that she would likely require valve surgery as well.  She would not recover well.  4/17 PCXR;Stable bilateral interstitial densities are noted concerning for edema or possibly atypical infection 4/19 PCXR;-persistent interstitial densities throughout both lungs -Slightly improved aeration in the perihilar regions.   I have personally reviewed and interpreted all radiology studies and my findings are as above.  VENTILATOR SETTINGS:    Cultures 4/13 SARS coronavirus negative 4/13 influenza A/B negative 4/13 respiratory virus panel negative 4/13 HIV nonreactive 4/13 blood right forearm NGTD 4/13 blood left arm NGTD 4/14 C. difficile antigen/toxin negative 4/14 GI panel negative 4/18 sputum pending     Antimicrobials: Anti-infectives (From admission, onward)   Start     Stop   03/21/20 0630  cefTRIAXone (ROCEPHIN) 1 g in sodium chloride 0.9 % 100 mL IVPB         03/20/20 1430  doxycycline (VIBRA-TABS) tablet 100 mg  03/20/20 0545  cefTRIAXone (ROCEPHIN) 1 g in sodium chloride 0.9 % 100 mL IVPB     03/20/20 0730   03/20/20 0545  azithromycin (ZITHROMAX) tablet 500 mg     03/20/20 [id number]       Devices    LINES / TUBES:      Continuous Infusions: . sodium chloride 1,000 mL (03/25/20 0737)  . cefTRIAXone (ROCEPHIN)  IV 1 g (03/26/20 0649)     Objective: Vitals:    03/26/20 0500 03/26/20 0723 03/26/20 0757 03/26/20 0759  BP:  129/89    Pulse:  93    Resp:  (!) 22    Temp:  (!) 97.5 F (36.4 C)    TempSrc:  Oral    SpO2:  95% 100% 100%  Weight: 70 kg     Height:        Intake/Output Summary (Last 24 hours) at 03/26/2020 0916 Last data filed at 03/26/2020 0800 Gross per 24 hour  Intake 392.57 ml  Output --  Net 392.57 ml   Filed Weights   03/24/20 1536 03/25/20 0500 03/26/20 0500  Weight: 66.4 kg 68.9 kg 70 kg   Physical Exam:  General: A/O x4, positive acute respiratory distress (improving) Eyes: negative scleral hemorrhage, negative anisocoria, negative icterus ENT: Negative Runny nose, negative gingival bleeding, Neck:  Negative scars, masses, torticollis, lymphadenopathy, JVD Lungs: Tachypnea positive expiratory wheezes, improving.  Negative crackles Cardiovascular: Tachycardic without murmur gallop or rub normal S1 and S2 Abdomen: negative abdominal pain, nondistended, positive soft, bowel sounds, no rebound, no ascites, no appreciable mass Extremities: No significant cyanosis, clubbing, or edema bilateral lower extremities Skin: Negative rashes, lesions, ulcers Psychiatric:  Negative depression, negative anxiety, negative fatigue, negative mania  Central nervous system:  Cranial nerves II through XII intact, tongue/uvula midline, all extremities muscle strength 5/5, sensation intact throughout, negative dysarthria, negative expressive aphasia, negative receptive aphasia. Physical Exam:       Data Reviewed: Care during the described time interval was provided by me .  I have reviewed this patient's available data, including medical history, events of note, physical examination, and all test results as part of my evaluation.   CBC: Recent Labs  Lab 03/22/20 0233 03/23/20 0218 03/24/20 0346 03/25/20 0226 03/26/20 0235  WBC 8.9 7.1 12.5* 11.3* 15.9*  NEUTROABS  --   --   --   --  13.2*  HGB 8.7* 8.5* 8.3* 7.9* 8.4*  HCT  27.3* 26.6* 26.0* 25.2* 26.9*  MCV 93.8 93.7 93.9 93.7 95.7  PLT 294 285 357 342 [id number]*   Basic Metabolic Panel: Recent Labs  Lab 03/22/20 0233 03/23/20 0218 03/24/20 0346 03/25/20 0226 03/26/20 0235  NA 137 137 138 135 136  K 3.9 3.4* 4.4 4.7 4.9  CL 105 106 110 107 107  CO2 19* 18* 16* 16* 16*  GLUCOSE 106* 114* 133* 146* 128*  BUN 18 17 24* 34* 54*  CREATININE 1.21* 1.24* 1.25* 1.43* 1.53*  CALCIUM 8.7* 8.3* 9.0 8.9 9.0  MG 2.1 2.2 2.1 2.1 2.4  PHOS 3.6 3.3 4.1 5.3* 4.9*   GFR: Estimated Creatinine Clearance: 38 mL/min (A) (by C-G formula based on SCr of 1.53 mg/dL (H)). Liver Function Tests: Recent Labs  Lab 03/22/20 0233 03/23/20 0218 03/24/20 0346 03/25/20 0226 03/26/20 0235  AST 30 57* 41 265* 203*  ALT 17 39 41 181* 225*  ALKPHOS 55 64 68 59 84  BILITOT 0.7 0.4 0.5 0.7 0.2*  PROT 6.3* 6.1* 6.6 6.1* 6.2*  ALBUMIN 2.8*  2.7* 2.7* 2.7* 2.8*   No results for input(s): LIPASE, AMYLASE in the last 168 hours. No results for input(s): AMMONIA in the last 168 hours. Coagulation Profile: No results for input(s): INR, PROTIME in the last 168 hours. Cardiac Enzymes: Recent Labs  Lab 03/20/20 0810  CKTOTAL 205   BNP (last 3 results) No results for input(s): PROBNP in the last 8760 hours. HbA1C: No results for input(s): HGBA1C in the last 72 hours. CBG: No results for input(s): GLUCAP in the last 168 hours. Lipid Profile: No results for input(s): CHOL, HDL, LDLCALC, TRIG, CHOLHDL, LDLDIRECT in the last 72 hours. Thyroid Function Tests: No results for input(s): TSH, T4TOTAL, FREET4, T3FREE, THYROIDAB in the last 72 hours. Anemia Panel: No results for input(s): VITAMINB12, FOLATE, FERRITIN, TIBC, IRON, RETICCTPCT in the last 72 hours. Urine analysis:    Component Value Date/Time   COLORURINE YELLOW 03/20/2020 1600   APPEARANCEUR CLEAR 03/20/2020 1600   LABSPEC 1.026 03/20/2020 1600   PHURINE 5.0 03/20/2020 1600   GLUCOSEU NEGATIVE 03/20/2020 1600   HGBUR  MODERATE (A) 03/20/2020 1600   BILIRUBINUR NEGATIVE 03/20/2020 1600   KETONESUR NEGATIVE 03/20/2020 1600   PROTEINUR NEGATIVE 03/20/2020 1600   NITRITE NEGATIVE 03/20/2020 1600   LEUKOCYTESUR SMALL (A) 03/20/2020 1600   Sepsis Labs: @LABRCNTIP (procalcitonin:4,lacticidven:4)  ) Recent Results (from the past 240 hour(s))  Respiratory Panel by RT PCR (Flu A&B, Covid) -     Status: None   Collection Time: 03/20/20  5:54 AM  Result Value Ref Range Status   SARS Coronavirus 2 by RT PCR NEGATIVE NEGATIVE Final    Comment: (NOTE) SARS-CoV-2 target nucleic acids are NOT DETECTED. The SARS-CoV-2 RNA is generally detectable in upper respiratoy specimens during the acute phase of infection. The lowest concentration of SARS-CoV-2 viral copies this assay can detect is 131 copies/mL. A negative result does not preclude SARS-Cov-2 infection and should not be used as the sole basis for treatment or other patient management decisions. A negative result may occur with  improper specimen collection/handling, submission of specimen other than nasopharyngeal swab, presence of viral mutation(s) within the areas targeted by this assay, and inadequate number of viral copies (<131 copies/mL). A negative result must be combined with clinical observations, patient history, and epidemiological information. The expected result is Negative. Fact Sheet for Patients:  PinkCheek.be Fact Sheet for Healthcare Providers:  GravelBags.it This test is not yet ap proved or cleared by the [place] FDA and  has been authorized for detection and/or diagnosis of SARS-CoV-2 by FDA under an Emergency Use Authorization (EUA). This EUA will remain  in effect (meaning this test can be used) for the duration of the COVID-19 declaration under Section 564(b)(1) of the Act, 21 U.S.C. section 360bbb-3(b)(1), unless the authorization is terminated or revoked sooner.     Influenza A by PCR NEGATIVE NEGATIVE Final   Influenza B by PCR NEGATIVE NEGATIVE Final    Comment: (NOTE) The Xpert Xpress SARS-CoV-2/FLU/RSV assay is intended as an aid in  the diagnosis of influenza from Nasopharyngeal swab specimens and  should not be used as a sole basis for treatment. Nasal washings and  aspirates are unacceptable for Xpert Xpress SARS-CoV-2/FLU/RSV  testing. Fact Sheet for Patients: PinkCheek.be Fact Sheet for Healthcare Providers: GravelBags.it This test is not yet approved or cleared by the [place] FDA and  has been authorized for detection and/or diagnosis of SARS-CoV-2 by  FDA under an Emergency Use Authorization (EUA). This EUA will remain  in effect (meaning this  test can be used) for the duration of the  Covid-19 declaration under Section 564(b)(1) of the Act, 21  U.S.C. section 360bbb-3(b)(1), unless the authorization is  terminated or revoked. Performed at [facility], [place] [address], [place], [place] [address]   Respiratory Panel by PCR     Status: None   Collection Time: 03/20/20  5:54 AM   Specimen: Nasopharyngeal Swab; Respiratory  Result Value Ref Range Status   Adenovirus NOT DETECTED NOT DETECTED Final   Coronavirus 229E NOT DETECTED NOT DETECTED Final    Comment: (NOTE) The Coronavirus on the Respiratory Panel, DOES NOT test for the novel  Coronavirus (2019 nCoV)    Coronavirus HKU1 NOT DETECTED NOT DETECTED Final   Coronavirus NL63 NOT DETECTED NOT DETECTED Final   Coronavirus OC43 NOT DETECTED NOT DETECTED Final   Metapneumovirus NOT DETECTED NOT DETECTED Final   Rhinovirus / Enterovirus NOT DETECTED NOT DETECTED Final   Influenza A NOT DETECTED NOT DETECTED Final   Influenza B NOT DETECTED NOT DETECTED Final   Parainfluenza Virus 1 NOT DETECTED NOT DETECTED Final   Parainfluenza Virus 2 NOT DETECTED NOT DETECTED Final   Parainfluenza Virus 3 NOT DETECTED NOT  DETECTED Final   Parainfluenza Virus 4 NOT DETECTED NOT DETECTED Final   Respiratory Syncytial Virus NOT DETECTED NOT DETECTED Final   Bordetella pertussis NOT DETECTED NOT DETECTED Final   Chlamydophila pneumoniae NOT DETECTED NOT DETECTED Final   Mycoplasma pneumoniae NOT DETECTED NOT DETECTED Final    Comment: Performed at [facility], [place]. [address] [place], [place] [address]  Blood Culture (routine x 2)     Status: None   Collection Time: 03/20/20  7:31 AM   Specimen: BLOOD RIGHT FOREARM  Result Value Ref Range Status   Specimen Description BLOOD RIGHT FOREARM  Final   Special Requests   Final    BOTTLES DRAWN AEROBIC AND ANAEROBIC Blood Culture adequate volume   Culture   Final    NO GROWTH 5 DAYS Performed at [facility], [place] [address], [place], [place] [address]    Report Status 03/25/2020 FINAL  Final  Blood Culture (routine x 2)     Status: None   Collection Time: 03/20/20  8:06 AM   Specimen: BLOOD LEFT ARM  Result Value Ref Range Status   Specimen Description BLOOD LEFT ARM  Final   Special Requests   Final    BOTTLES DRAWN AEROBIC AND ANAEROBIC Blood Culture adequate volume   Culture   Final    NO GROWTH 5 DAYS Performed at [facility], [place] [address], [place], [place] [address]    Report Status 03/25/2020 FINAL  Final  MRSA PCR Screening     Status: None   Collection Time: 03/20/20  3:52 PM   Specimen: Nasopharyngeal  Result Value Ref Range Status   MRSA by PCR NEGATIVE NEGATIVE Final    Comment:        The GeneXpert MRSA Assay (FDA approved for NASAL specimens only), is one component of a comprehensive MRSA colonization surveillance program. It is not intended to diagnose MRSA infection nor to guide or monitor treatment for MRSA infections. Performed at [facility], [place] [address] [place], [place] [address]   Gastrointestinal Panel by PCR , Stool     Status: None   Collection Time: 03/21/20  1:50 PM   Specimen: STOOL   Result Value Ref Range Status   Campylobacter species NOT DETECTED NOT  DETECTED Final   Plesimonas shigelloides NOT DETECTED NOT DETECTED Final   Salmonella species NOT DETECTED NOT DETECTED Final   Yersinia enterocolitica NOT DETECTED NOT DETECTED Final   Vibrio species NOT DETECTED NOT DETECTED Final   Vibrio cholerae NOT DETECTED NOT DETECTED Final   Enteroaggregative E coli (EAEC) NOT DETECTED NOT DETECTED Final   Enteropathogenic E coli (EPEC) NOT DETECTED NOT DETECTED Final   Enterotoxigenic E coli (ETEC) NOT DETECTED NOT DETECTED Final   Shiga like toxin producing E coli (STEC) NOT DETECTED NOT DETECTED Final   Shigella/Enteroinvasive E coli (EIEC) NOT DETECTED NOT DETECTED Final   Cryptosporidium NOT DETECTED NOT DETECTED Final   Cyclospora cayetanensis NOT DETECTED NOT DETECTED Final   Entamoeba histolytica NOT DETECTED NOT DETECTED Final   Giardia lamblia NOT DETECTED NOT DETECTED Final   Adenovirus F40/41 NOT DETECTED NOT DETECTED Final   Astrovirus NOT DETECTED NOT DETECTED Final   Norovirus GI/GII NOT DETECTED NOT DETECTED Final   Rotavirus A NOT DETECTED NOT DETECTED Final   Sapovirus (I, II, IV, and V) NOT DETECTED NOT DETECTED Final    Comment: Performed at [facility] [place], [address] [place], [place] [address]  C Difficile Quick Screen w PCR reflex     Status: None   Collection Time: 03/21/20  1:50 PM   Specimen: STOOL  Result Value Ref Range Status   C Diff antigen NEGATIVE NEGATIVE Final   C Diff toxin NEGATIVE NEGATIVE Final   C Diff interpretation No C. difficile detected.  Final    Comment: Performed at [facility], [place] [address], [place], [place] [address]  Expectorated sputum assessment w rflx to resp cult     Status: None (Preliminary result)   Collection Time: 03/25/20  4:00 PM   Specimen: Expectorated Sputum  Result Value Ref Range Status   Specimen Description Expect. Sput  Final   Special Requests NONE  Final   Sputum evaluation    Final    Sputum specimen not acceptable for testing.  Please recollect.   RESULT CALLED TO, READ BACK BY AND VERIFIED WITH: Su Hilt RN @ [id number] ON 03/25/20 BT ROBINSON Z.  Performed at [facility], [place] [address], [place], [place] [address]    Report Status PENDING  Incomplete         Radiology Studies: DG CHEST PORT 1 VIEW  Result Date: 03/26/2020 CLINICAL DATA:  Pneumonia. EXAM: PORTABLE CHEST 1 VIEW COMPARISON:  03/24/2020 FINDINGS: Diffuse interstitial lung densities. Aeration in the perihilar region has slightly improved. Heart size is upper limits of normal but stable. Trachea remains midline. Negative for pneumothorax. Bone structures are unremarkable. IMPRESSION: Persistent interstitial densities throughout both lungs with slightly improved aeration in the perihilar regions. Electronically Signed   By: Markus Daft M.D.   On: 03/26/2020 08:41        Scheduled Meds: . aspirin EC  81 mg Oral Daily  . budesonide (PULMICORT) nebulizer solution  0.25 mg Nebulization BID  . calcium carbonate  1 tablet Oral TID WC  . carvedilol  6.25 mg Oral BID WC  . doxycycline  100 mg Oral Q12H  . heparin  5,000 Units Subcutaneous Q8H  . levalbuterol  0.63 mg Nebulization BID  . nicotine  21 mg Transdermal Daily  . rosuvastatin  20 mg Oral Daily  . sodium chloride flush  3 mL Intravenous Q12H  . sodium chloride flush  3 mL Intravenous Q12H  . sodium chloride flush  3  mL Intravenous Q12H   Continuous Infusions: . sodium chloride 1,000 mL (03/25/20 0737)  . cefTRIAXone (ROCEPHIN)  IV 1 g (03/26/20 0649)     LOS: 6 days   The patient is critically ill with multiple organ systems failure and requires high complexity decision making for assessment and support, frequent evaluation and titration of therapies, application of advanced monitoring technologies and extensive interpretation of multiple databases. Critical Care Time devoted to patient care services described in this note   Time spent: 40 minutes     Sheylin Scharnhorst, Geraldo Docker, MD [facility] Pager [phone]  If 7PM-7AM, please contact night-coverage [email or web address] Password [facility] 03/26/2020, 9:16 AM

## 2020-03-26 NOTE — Evaluation (Signed)
Occupational Therapy Evaluation Patient Details Name: Anita [name] MRN: [id number] DOB: [birth year] Today's Date: 03/26/2020    History of Present Illness Pt is a 59 y/o female admitted secondary to acute hypoxic respiratory failure and SIRS. PMH includes multiple sclerosis with optic neuritis, right ear hearing loss, GERD, arthritis.    Clinical Impression   Pt PTA: Pt Living with daughter in handicapped accessible home. Pt reports near independence with ADL and mobility with rollator. Pt currently performing ADL tasks with maxA overall and set-upA for grooming task. Pt modA +2 for transfers. >O2 90% on RA. Pt would benefit from continued OT skilled services for ADL, mobility and safety in CIR setting. OT following acutely.    Follow Up Recommendations  CIR    Equipment Recommendations  Other (comment)(to be determined)    Recommendations for Other Services Rehab consult     Precautions / Restrictions Precautions Precautions: Fall Restrictions Weight Bearing Restrictions: No      Mobility Bed Mobility Overal bed mobility: Needs Assistance Bed Mobility: Supine to Sit     Supine to sit: Mod assist     General bed mobility comments: Mod A for trunk elevation to come to sitting. Required assist to scoot hips to EOB.   Transfers Overall transfer level: Needs assistance Equipment used: Rolling walker (2 wheeled);2 person hand held assist Transfers: Sit to/from [facility] Sit to Stand: Mod assist;+2 physical assistance Stand pivot transfers: Mod assist;+2 physical assistance       General transfer comment: Mod A +2 for lift assist and steadying to stand. Pt unable to take steps to recliner with use of RW, so used HHA instead. Physical assist for weightshifting to take steps. oxygen sats >92% on RA throughout.     Balance Overall balance assessment: Needs assistance Sitting-balance support: Bilateral upper extremity supported;Feet  supported Sitting balance-Leahy Scale: Poor Sitting balance - Comments: L lateral lean at times requiring min A for balance.    Standing balance support: Bilateral upper extremity supported;During functional activity Standing balance-Leahy Scale: Poor Standing balance comment: Reliant on UE and external support                            ADL either performed or assessed with clinical judgement   ADL Overall ADL's : Needs assistance/impaired Eating/Feeding: Set up;Sitting   Grooming: Set up;Sitting   Upper Body Bathing: Minimal assistance;Sitting   Lower Body Bathing: Moderate assistance;+2 for physical assistance;+2 for safety/equipment;Cueing for safety;Sitting/lateral leans   Upper Body Dressing : Minimal assistance;Sitting   Lower Body Dressing: Maximal assistance;+2 for safety/equipment;Cueing for sequencing   Toilet Transfer: Moderate assistance;+2 for physical assistance;+2 for safety/equipment;BSC;Stand-pivot;Cueing for safety;Cueing for sequencing   Toileting- Clothing Manipulation and Hygiene: Maximal assistance;+2 for safety/equipment;Sitting/lateral lean;Sit to/from stand;Cueing for sequencing       Functional mobility during ADLs: Moderate assistance;+2 for physical assistance;Cueing for safety General ADL Comments: Pt sitting up in bed eating finger foods, Pt reports grooming and feeding, pt set-upA.     Vision Baseline Vision/History: Wears glasses Wears Glasses: At all times Patient Visual Report: No change from baseline Vision Assessment?: No apparent visual deficits     Perception     Praxis      Pertinent Vitals/Pain Pain Assessment: No/denies pain     Hand Dominance Right   Extremity/Trunk Assessment Upper Extremity Assessment Upper Extremity Assessment: Generalized weakness;RUE deficits/detail;LUE deficits/detail RUE Deficits / Details:  shoulder FF 80*; elbow through hands, WFLs. RUE Coordination: decreased  gross motor LUE  Deficits / Details:  shoulder FF 80*; elbow through hands, WFLs. LUE Coordination: decreased gross motor   Lower Extremity Assessment Lower Extremity Assessment: Generalized weakness   Cervical / Trunk Assessment Cervical / Trunk Assessment: Kyphotic   Communication Communication Communication: Expressive difficulties(slow speech, sometimes slurred)   Cognition Arousal/Alertness: Awake/alert Behavior During Therapy: WFL for tasks assessed/performed Overall Cognitive Status: Within Functional Limits for tasks assessed                                     General Comments  Pt's daughter present and interjecting as needed while pt finished eating her handburger    Exercises     Shoulder Instructions      Home Living Family/patient expects to be discharged to:: Private residence Living Arrangements: Children Available Help at Discharge: Family;Available 24 hours/day Type of Home: House Home Access: Stairs to enter [employer] of Steps: 1 Entrance Stairs-Rails: None(uses door jam) Home Layout: One level     Bathroom Shower/Tub: Teacher, early years/pre: Handicapped height     Home Equipment: Grab bars - tub/shower;Walker - 4 wheels   Additional Comments: Daughter is home 24/7      Prior Functioning/Environment Level of Independence: Needs assistance;Independent with assistive device(s)  Gait / Transfers Assistance Needed: Rollator for mobility; transfers in/out of garden tub; ADL's / Homemaking Assistance Needed: BADLs with independence; IADLs provided for her cooking, cleaning; pt can make her own bed and clean the dishes.            OT Problem List: Decreased strength;Decreased activity tolerance;Impaired balance (sitting and/or standing);Decreased coordination;Decreased safety awareness;Pain;Increased edema;Impaired UE functional use;Decreased knowledge of use of DME or AE      OT Treatment/Interventions: Self-care/ADL  training;Therapeutic exercise;DME and/or AE instruction;Energy conservation;Therapeutic activities;Patient/family education;Balance training    OT Goals(Current goals can be found in the care plan section) Acute Rehab OT Goals Patient Stated Goal: to be more independent OT Goal Formulation: With patient Time For Goal Achievement: 04/09/20 Potential to Achieve Goals: Good ADL Goals Pt Will Perform Grooming: with min guard assist;standing Pt Will Perform Lower Body Dressing: sitting/lateral leans;with mod assist;sit to/from stand Pt Will Transfer to Toilet: with min assist;ambulating;bedside commode Pt/caregiver will Perform Home Exercise Program: Increased strength;Both right and left upper extremity;With Supervision Additional ADL Goal #1: Pt will increase to x3 mins standing with miguardA for BADL in order to increase endurance.  OT Frequency: Min 2X/week   Barriers to D/C:            Co-evaluation PT/OT/SLP Co-Evaluation/Treatment: Yes Reason for Co-Treatment: For patient/therapist safety PT goals addressed during session: Balance;Mobility/safety with mobility OT goals addressed during session: ADL's and self-care      AM-PAC OT "6 Clicks" Daily Activity     Outcome Measure Help from another person eating meals?: None Help from another person taking care of personal grooming?: A Little Help from another person toileting, which includes using toliet, bedpan, or urinal?: A Lot Help from another person bathing (including washing, rinsing, drying)?: A Lot Help from another person to put on and taking off regular upper body clothing?: A Little Help from another person to put on and taking off regular lower body clothing?: A Lot 6 Click Score: 16   End of Session Equipment Utilized During Treatment: Gait belt;Rolling walker Nurse Communication: Mobility status;Other (comment);Need for lift equipment(talked with NT to use stedy for safest transfers)  Activity Tolerance: Patient  tolerated treatment well Patient left: in chair;with call bell/phone within reach;with chair alarm set  OT Visit Diagnosis: Unsteadiness on feet (R26.81);Muscle weakness (generalized) (M62.81);Pain Pain - part of body: (back)                Time: 1223-1300 OT Time Calculation (min): 37 min Charges:  OT General Charges $OT Visit: 1 Visit OT Evaluation $OT Eval Moderate Complexity: [place], OTR/L Acute Rehabilitation Services Pager: [phone] Office: [phone]  C 03/26/2020, 3:27 PM

## 2020-03-26 NOTE — Plan of Care (Signed)

## 2020-03-26 NOTE — Progress Notes (Signed)
Rehab Admissions Coordinator Note:  Patient was screened by Cleatrice Burke for appropriateness for an Inpatient Acute Rehab Consult per PT recs. .  At this time, we are recommending Inpatient Rehab consult. I will place order per protocol.  Cleatrice Burke RN MSN 03/26/2020, 2:47 PM  I can be reached at [phone].

## 2020-03-26 NOTE — Evaluation (Signed)
Physical Therapy Evaluation Patient Details Name: Anita [name] MRN: [id number] DOB: [birth year] Today's Date: 03/26/2020   History of Present Illness  Pt is a 59 y/o female admitted secondary to acute hypoxic respiratory failure and SIRS. PMH includes multiple sclerosis with optic neuritis, right ear hearing loss, GERD, arthritis.   Clinical Impression  Pt admitted secondary to problem above with deficits below. Pt requiring mod A +2 to stand and transfer to chair this session. Was unable to take steps using RW. Pt currently very weak and would benefit from CIR level intensities to increase independence and safety with mobility. Has 24/7 assist at home. Will continue to follow acutely to maximize functional mobility independence and safety.     Follow Up Recommendations CIR    Equipment Recommendations  Other (comment)(TBD)    Recommendations for Other Services       Precautions / Restrictions Precautions Precautions: Fall Restrictions Weight Bearing Restrictions: No      Mobility  Bed Mobility Overal bed mobility: Needs Assistance Bed Mobility: Supine to Sit     Supine to sit: Mod assist     General bed mobility comments: Mod A for trunk elevation to come to sitting. Required assist to scoot hips to EOB.   Transfers Overall transfer level: Needs assistance Equipment used: Rolling walker (2 wheeled);2 person hand held assist Transfers: Sit to/from [facility] Sit to Stand: Mod assist;+2 physical assistance Stand pivot transfers: Mod assist;+2 physical assistance       General transfer comment: Mod A +2 for lift assist and steadying to stand. Pt unable to take steps to recliner with use of RW, so used HHA instead. Physical assist for weightshifting to take steps. oxygen sats >92% on RA throughout.   Ambulation/Gait                Stairs            Wheelchair Mobility    Modified Rankin (Stroke Patients Only)       Balance  Overall balance assessment: Needs assistance Sitting-balance support: Bilateral upper extremity supported;Feet supported Sitting balance-Leahy Scale: Poor Sitting balance - Comments: L lateral lean at times requiring min A for balance.    Standing balance support: Bilateral upper extremity supported;During functional activity Standing balance-Leahy Scale: Poor Standing balance comment: Reliant on UE and external support                              Pertinent Vitals/Pain Pain Assessment: No/denies pain    Home Living Family/patient expects to be discharged to:: Private residence Living Arrangements: Children Available Help at Discharge: Family;Available 24 hours/day Type of Home: House Home Access: Stairs to enter Entrance Stairs-Rails: None(uses door jam) Entrance Stairs-Number of Steps: 1 Home Layout: One level Home Equipment: Grab bars - tub/shower;Walker - 4 wheels Additional Comments: Daughter is home 24/7    Prior Function Level of Independence: Needs assistance;Independent with assistive device(s)   Gait / Transfers Assistance Needed: Rollator for mobility; transfers in/out of garden tub;  ADL's / Homemaking Assistance Needed: BADLs with independence; IADLs provided for her cooking, cleaning; pt can make her own bed and clean the dishes.  Comments: Pt will do the dishes; BADLs perfo     Hand Dominance   Dominant Hand: Right    Extremity/Trunk Assessment   Upper Extremity Assessment Upper Extremity Assessment: Defer to OT evaluation    Lower Extremity Assessment Lower Extremity Assessment: Generalized weakness  Cervical / Trunk Assessment Cervical / Trunk Assessment: Kyphotic  Communication   Communication: Expressive difficulties(slow speech, sometimes slurred)  Cognition Arousal/Alertness: Awake/alert Behavior During Therapy: WFL for tasks assessed/performed Overall Cognitive Status: Within Functional Limits for tasks assessed                                         General Comments General comments (skin integrity, edema, etc.): Pt's daughter present throughout     Exercises     Assessment/Plan    PT Assessment Patient needs continued PT services  PT Problem List Decreased strength;Decreased balance;Decreased mobility;Decreased activity tolerance;Decreased knowledge of use of DME;Decreased knowledge of precautions       PT Treatment Interventions Gait training;DME instruction;Functional mobility training;Therapeutic activities;Therapeutic exercise;Balance training;Patient/family education    PT Goals (Current goals can be found in the Care Plan section)  Acute Rehab PT Goals Patient Stated Goal: to be more independent PT Goal Formulation: With patient Time For Goal Achievement: 04/09/20 Potential to Achieve Goals: Good    Frequency Min 3X/week   Barriers to discharge        Co-evaluation PT/OT/SLP Co-Evaluation/Treatment: Yes Reason for Co-Treatment: To address functional/ADL transfers;For patient/therapist safety PT goals addressed during session: Balance;Mobility/safety with mobility         AM-PAC PT "6 Clicks" Mobility  Outcome Measure Help needed turning from your back to your side while in a flat bed without using bedrails?: A Little Help needed moving from lying on your back to sitting on the side of a flat bed without using bedrails?: A Lot Help needed moving to and from a bed to a chair (including a wheelchair)?: A Lot Help needed standing up from a chair using your arms (e.g., wheelchair or bedside chair)?: A Lot Help needed to walk in hospital room?: A Lot Help needed climbing 3-5 steps with a railing? : Total 6 Click Score: 12    End of Session Equipment Utilized During Treatment: Gait belt Activity Tolerance: Patient tolerated treatment well Patient left: in chair;with call bell/phone within reach;with chair alarm set;with family/visitor present Nurse Communication:  Mobility status PT Visit Diagnosis: Unsteadiness on feet (R26.81);Muscle weakness (generalized) (M62.81)    Time: [id number] PT Time Calculation (min) (ACUTE ONLY): 36 min   Charges:   PT Evaluation $PT Eval Moderate Complexity: 1 Mod          Reuel Derby, PT, DPT  Acute Rehabilitation Services  Pager: [phone] Office: [phone]   Rudean Hitt 03/26/2020, 2:37 PM

## 2020-03-26 NOTE — Progress Notes (Signed)
SATURATION QUALIFICATIONS: (This note is used to comply with regulatory documentation for home oxygen)  Patient Saturations on Room Air at Rest = 96%  Patient Saturations on Room Air while Ambulating = 92%  Pt was not able to ambulate in halls at rest in bed saturations 96% on room air, needed to assist x2 to chair has too weak to ambulate saturations remained above 90% on room air.

## 2020-03-26 NOTE — Progress Notes (Signed)
Initial Nutrition Assessment  DOCUMENTATION CODES:   Not applicable  INTERVENTION:    Ensure Enlive po BID, each supplement provides 350 kcal and 20 grams of protein  MVI daily   NUTRITION DIAGNOSIS:   Increased nutrient needs related to acute illness as evidenced by estimated needs.  GOAL:   Patient will meet greater than or equal to 90% of their needs  MONITOR:   PO intake, Supplement acceptance, Weight trends, Labs, I & O's  REASON FOR ASSESSMENT:   Consult Diet education  ASSESSMENT:   Patient with PMH significant for multiple sclerosis with optic neuritis, R hearing loss, GERD, and tobacco abuse. Presents this admission with atypical PNA.   Reports having decline in appetite over the last couple of month for unknown reasons. States she typically consumes 3 meals daily that consist of B- PB&J uncrustable L- snack foods D- meat, grain. Does not use supplementation. Discussed the importance of protein intake for preservation of lean body mass. Provided examples on ways to decrease sodium intake in diet. Discouraged intake of processed foods and use of salt shaker. Pt willing to try Ensure this admission. Meal completions charted 0-70% for her last 5 meals.   RD provided "Low Sodium Nutrition Therapy" handout from the Academy of Nutrition [employer] Dietetics. Reviewed patient's dietary recall.   Endorses a weight loss of 50 lb over the last 3 years. Weight history limited over the last year. Suspect some muscle depletion is a result of immobility.   Medications: reviewed  Labs: Phosphorous 4.9 (H)  Cr 1.53-trending up LFTs elevated    Diet Order:   Diet Order            Diet Heart Room service appropriate? Yes; Fluid consistency: Thin  Diet effective now              EDUCATION NEEDS:   Education needs have been addressed  Skin:  Skin Assessment: Reviewed RN Assessment  Last BM:  4/18  Height:   Ht Readings from Last 1 Encounters:  03/20/20 5\' 3"  (1.6 m)     Weight:   Wt Readings from Last 1 Encounters:  03/26/20 70 kg    BMI:  Body mass index is 27.34 kg/m.  Estimated Nutritional Needs:   Kcal:  1750-1950 kcal  Protein:  90-105 grams  Fluid:  >/= 1.7 L/day   Mariana Single RD, LDN Clinical Nutrition Pager listed in [place]

## 2020-03-27 LAB — SARS CORONAVIRUS 2 (TAT 6-24 HRS): SARS Coronavirus 2: NEGATIVE

## 2020-03-27 LAB — CBC WITH DIFFERENTIAL/PLATELET
Abs Immature Granulocytes: 0.35 10*3/uL — ABNORMAL HIGH (ref 0.00–0.07)
Basophils Absolute: 0 10*3/uL (ref 0.0–0.1)
Basophils Relative: 0 %
Eosinophils Absolute: 0 10*3/uL (ref 0.0–0.5)
Eosinophils Relative: 0 %
HCT: 27.5 % — ABNORMAL LOW (ref 36.0–46.0)
Hemoglobin: 8.6 g/dL — ABNORMAL LOW (ref 12.0–15.0)
Immature Granulocytes: 2 %
Lymphocytes Relative: 15 %
Lymphs Abs: 2.5 10*3/uL (ref 0.7–4.0)
MCH: 29.9 pg (ref 26.0–34.0)
MCHC: 31.3 g/dL (ref 30.0–36.0)
MCV: 95.5 fL (ref 80.0–100.0)
Monocytes Absolute: 0.8 10*3/uL (ref 0.1–1.0)
Monocytes Relative: 5 %
Neutro Abs: 13.3 10*3/uL — ABNORMAL HIGH (ref 1.7–7.7)
Neutrophils Relative %: 78 %
Platelets: 417 10*3/uL — ABNORMAL HIGH (ref 150–400)
RBC: 2.88 MIL/uL — ABNORMAL LOW (ref 3.87–5.11)
RDW: 15.9 % — ABNORMAL HIGH (ref 11.5–15.5)
WBC: 17 10*3/uL — ABNORMAL HIGH (ref 4.0–10.5)
nRBC: 0.5 % — ABNORMAL HIGH (ref 0.0–0.2)

## 2020-03-27 LAB — COMPREHENSIVE METABOLIC PANEL
ALT: 225 U/L — ABNORMAL HIGH (ref 0–44)
AST: 138 U/L — ABNORMAL HIGH (ref 15–41)
Albumin: 2.9 g/dL — ABNORMAL LOW (ref 3.5–5.0)
Alkaline Phosphatase: 165 U/L — ABNORMAL HIGH (ref 38–126)
Anion gap: 13 (ref 5–15)
BUN: 68 mg/dL — ABNORMAL HIGH (ref 6–20)
CO2: 17 mmol/L — ABNORMAL LOW (ref 22–32)
Calcium: 9.2 mg/dL (ref 8.9–10.3)
Chloride: 105 mmol/L (ref 98–111)
Creatinine, Ser: 1.58 mg/dL — ABNORMAL HIGH (ref 0.44–1.00)
GFR calc Af Amer: 42 mL/min — ABNORMAL LOW (ref >60)
GFR calc non Af Amer: 36 mL/min — ABNORMAL LOW (ref >60)
Glucose, Bld: 142 mg/dL — ABNORMAL HIGH (ref 70–99)
Potassium: 4.7 mmol/L (ref 3.5–5.1)
Sodium: 135 mmol/L (ref 135–145)
Total Bilirubin: 0.6 mg/dL (ref 0.3–1.2)
Total Protein: 6 g/dL — ABNORMAL LOW (ref 6.5–8.1)

## 2020-03-27 LAB — MAGNESIUM: Magnesium: 2.5 mg/dL — ABNORMAL HIGH (ref 1.7–2.4)

## 2020-03-27 LAB — PHOSPHORUS: Phosphorus: 5 mg/dL — ABNORMAL HIGH (ref 2.5–4.6)

## 2020-03-27 LAB — PROCALCITONIN: Procalcitonin: 0.24 ng/mL

## 2020-03-27 NOTE — TOC Initial Note (Signed)
Transition of Care [facility]) - Initial/Assessment Note    Patient Details  Name: Anita [name] MRN: [id number] Date of Birth: [birth year]  [facility]) CM/SW Contact:    Zenon Mayo, RN Phone Number: 03/27/2020, 3:07 PM  Clinical Narrative:                 Patient lives with daughter, NCM spoke with cyny she gave permission for NCM to fax her out to [place] [facility] for SNF.  NCM spoke with Cyndy and Patient informed them of 3 acceptances already, she chose [facility].  NCM made referral to Tammy with [facility], she states she will need a Covid, NCM informed Staff RN [place] , need a covid test. Tammy states will plan for dc to SNF when receives Covid.   Expected Discharge Plan: Skilled Nursing Facility Barriers to Discharge: (await covid)   Patient Goals and CMS Choice Patient states their goals for this hospitalization and ongoing recovery are:: snf CMS Medicare.gov Compare Post Acute Care list provided to:: Patient Represenative (must comment)(daughter, cyndy) Choice offered to / list presented to : Adult Children, Patient  Expected Discharge Plan and Services Expected Discharge Plan: [place]   Discharge Planning Services: CM Consult Post Acute Care Choice: [place] Living arrangements for the past 2 months: Single Family Home                   DME Agency: NA       HH Arranged: NA HH Agency: [facility] [facility] Date [facility] Agency Contacted: 03/21/20 Time HH Agency Contacted: [id number] Representative spoke with at [place]: Tommi Rumps  Prior Living Arrangements/Services Living arrangements for the past 2 months: [place] Lives with:: Adult Children Patient language and need for interpreter reviewed:: Yes Do you feel safe going back to the place where you live?: Yes      Need for Family Participation in Patient Care: Yes (Comment) Care giver support system in place?: Yes (comment)   Criminal  Activity/Legal Involvement Pertinent to Current Situation/Hospitalization: No - Comment as needed  Activities of Daily Living Home Assistive Devices/Equipment: Environmental consultant (specify type), Wheelchair, Dentures (specify type) ADL Screening (condition at time of admission) Patient's cognitive ability adequate to safely complete daily activities?: Yes Is the patient deaf or have difficulty hearing?: Yes Does the patient have difficulty seeing, even when wearing glasses/contacts?: Yes(patient is deaf in right ear) Does the patient have difficulty concentrating, remembering, or making decisions?: No Patient able to express need for assistance with ADLs?: Yes Does the patient have difficulty dressing or bathing?: Yes Independently performs ADLs?: Yes (appropriate for developmental age) Does the patient have difficulty walking or climbing stairs?: Yes Weakness of Legs: Both Weakness of Arms/Hands: Both  Permission Sought/Granted                  Emotional Assessment Appearance:: Appears stated age Attitude/Demeanor/Rapport: Engaged Affect (typically observed): Appropriate Orientation: : Oriented to Self, Oriented to Place, Oriented to  Time, Oriented to Situation Alcohol / Substance Use: Not Applicable Psych Involvement: No (comment)  Admission diagnosis:  Respiratory distress [R06.03] Atypical pneumonia [J18.9] Elevated troponin [R77.8] SIRS (systemic inflammatory response syndrome) (HCC) [R65.10] Acute respiratory failure with hypoxia ([place]) [J96.01] AKI (acute kidney injury) ([place]) [N17.9] Patient Active Problem List   Diagnosis Date Noted  . Atypical pneumonia 03/22/2020  . Tobacco abuse 03/22/2020  . Elevated troponin 03/22/2020  . Acute systolic CHF (congestive heart failure) ([place]) 03/22/2020  . Pulmonary hypertension (  [place]) 03/22/2020  . Retroperitoneal mass 03/22/2020  . Insomnia 03/22/2020  . Acute respiratory failure with hypoxia ([place]) 03/20/2020  . Prolonged QT interval  03/20/2020  . NSTEMI (non-ST elevated myocardial infarction) ([place]) 03/20/2020  . SIRS (systemic inflammatory response syndrome) ([place]) 03/20/2020  . Normocytic anemia 03/20/2020  . Pneumonia 03/20/2020  . AKI (acute kidney injury) ([place]) 03/20/2020  . Acute respiratory distress 03/20/2020  . Respiratory distress 03/20/2020  . Gastroesophageal reflux disease 02/15/2018  . Chronic migraine without aura with status migrainosus, not intractable 02/15/2018  . Osteoarthritis 02/15/2018  . Essential hypertension 02/15/2018  . Disturbance of skin sensation 03/26/2012  . Multiple sclerosis ([place]) 03/26/2012  . TOBACCO ABUSE 01/06/2011   PCP:  [name], NP Pharmacy:   [id number] [place], [place] - [address] [place] [address] Phone: [phone] Fax: [phone]  [id number] - [place], [address] [place] [address] Phone: [phone] Fax: [phone]     Social Determinants of Health (SDOH) Interventions    Readmission Risk Interventions No flowsheet data found.

## 2020-03-27 NOTE — NC FL2 (Signed)
[place] LEVEL OF CARE SCREENING TOOL     IDENTIFICATION  Patient Name: Anita [name] Birthdate: 11/26/1961 Sex: female Admission Date (Current Location): 03/20/2020  [facility] and [place] Number:  Herbalist and Address:  [place], [place] [address], [place], [place] [address]      Provider Number: [id number]  Attending Physician Name and Address:  Allie Bossier, MD  Relative Name and Phone Number:  Inis Sizer, [phone]    Current Level of Care:   Recommended Level of Care: [place] Prior Approval Number:    Date Approved/Denied:   PASRR Number: [id number]  Discharge Plan: SNF    Current Diagnoses: Patient Active Problem List   Diagnosis Date Noted  . Atypical pneumonia 03/22/2020  . Tobacco abuse 03/22/2020  . Elevated troponin 03/22/2020  . Acute systolic CHF (congestive heart failure) ([place]) 03/22/2020  . Pulmonary hypertension ([place]) 03/22/2020  . Retroperitoneal mass 03/22/2020  . Insomnia 03/22/2020  . Acute respiratory failure with hypoxia ([place]) 03/20/2020  . Prolonged QT interval 03/20/2020  . NSTEMI (non-ST elevated myocardial infarction) ([place]) 03/20/2020  . SIRS (systemic inflammatory response syndrome) ([place]) 03/20/2020  . Normocytic anemia 03/20/2020  . Pneumonia 03/20/2020  . AKI (acute kidney injury) ([place]) 03/20/2020  . Acute respiratory distress 03/20/2020  . Respiratory distress 03/20/2020  . Gastroesophageal reflux disease 02/15/2018  . Chronic migraine without aura with status migrainosus, not intractable 02/15/2018  . Osteoarthritis 02/15/2018  . Essential hypertension 02/15/2018  . Disturbance of skin sensation 03/26/2012  . Multiple sclerosis ([place]) 03/26/2012  . TOBACCO ABUSE 01/06/2011    Orientation RESPIRATION BLADDER Height & Weight     Self, Time, Situation, Place  O2([place] 2) External catheter Weight: 155 lb 10.3 oz (70.6 kg) Height:  5\' 3"  (160 cm)   BEHAVIORAL SYMPTOMS/MOOD NEUROLOGICAL BOWEL NUTRITION STATUS      Continent Diet(See discharge summary)  AMBULATORY STATUS COMMUNICATION OF NEEDS Skin   Extensive Assist Verbally Normal                       Personal Care Assistance Level of Assistance  Bathing, Feeding, Dressing Bathing Assistance: Maximum assistance Feeding assistance: Limited assistance Dressing Assistance: Maximum assistance     Functional Limitations Info  Sight, Hearing, Speech Sight Info: Adequate Hearing Info: Adequate Speech Info: Impaired(Slurred at baseline)    [place]  PT (By licensed PT), OT (By licensed OT)     PT Frequency: 5x week OT Frequency: 5x week            Contractures Contractures Info: Not present    Additional Factors Info  Code Status, Allergies Code Status Info: Full Allergies Info: Acthar, codeine, prednisone           Current Medications (03/27/2020):  This is the current hospital active medication list Current Facility-Administered Medications  Medication Dose Route Frequency Provider Last Rate Last Admin  . 0.9 %  sodium chloride infusion  250 mL Intravenous PRN Leonie Man, MD 10 mL/hr at 03/25/20 0737 1,000 mL at 03/25/20 0737  . acetaminophen (TYLENOL) tablet 650 mg  650 mg Oral Q6H PRN Leonie Man, MD   650 mg at 03/27/20 [id number]   Or  . acetaminophen (TYLENOL) suppository 650 mg  650 mg Rectal Q6H PRN Leonie Man, MD      . aspirin EC tablet 81 mg  81 mg Oral Daily Buford Dresser, MD   81 mg  at 03/27/20 1053  . budesonide (PULMICORT) nebulizer solution 0.25 mg  0.25 mg Nebulization BID Allie Bossier, MD   0.25 mg at 03/27/20 [id number]  . calcium carbonate (TUMS - dosed in mg elemental calcium) chewable tablet 200 mg of elemental calcium  1 tablet Oral TID WC Allie Bossier, MD   200 mg of elemental calcium at 03/27/20 1252  . carvedilol (COREG) tablet 6.25 mg  6.25 mg Oral BID WC Allie Bossier, MD   6.25 mg at  03/27/20 0827  . diphenhydrAMINE (BENADRYL) capsule 25 mg  25 mg Oral QHS PRN Leonie Man, MD   25 mg at 03/26/20 2131  . feeding supplement (ENSURE ENLIVE) (ENSURE ENLIVE) liquid 237 mL  237 mL Oral BID BM Allie Bossier, MD   237 mL at 03/27/20 1105  . heparin injection 5,000 Units  5,000 Units Subcutaneous Q8H Leonie Man, MD   5,000 Units at 03/27/20 [phone]  . levalbuterol (XOPENEX) nebulizer solution 0.63 mg  0.63 mg Nebulization Q6H PRN Allie Bossier, MD      . levalbuterol Penne Lash) nebulizer solution 0.63 mg  0.63 mg Nebulization BID Allie Bossier, MD   0.63 mg at 03/27/20 [id number]  . methocarbamol (ROBAXIN) tablet 500 mg  500 mg Oral Q6H PRN Leonie Man, MD   500 mg at 03/27/20 1101  . multivitamin with minerals tablet 1 tablet  1 tablet Oral Daily Allie Bossier, MD   1 tablet at 03/27/20 1053  . nicotine (NICODERM CQ - dosed in mg/24 hours) patch 21 mg  21 mg Transdermal Daily Leonie Man, MD   21 mg at 03/27/20 1053  . rosuvastatin (CRESTOR) tablet 20 mg  20 mg Oral Daily Buford Dresser, MD   20 mg at 03/27/20 1053  . sodium chloride flush (NS) 0.9 % injection 3 mL  3 mL Intravenous Q12H Leonie Man, MD   3 mL at 03/27/20 1055  . sodium chloride flush (NS) 0.9 % injection 3 mL  3 mL Intravenous Q12H Leonie Man, MD   3 mL at 03/26/20 2131  . sodium chloride flush (NS) 0.9 % injection 3 mL  3 mL Intravenous Q12H Leonie Man, MD   3 mL at 03/26/20 2131  . sodium chloride flush (NS) 0.9 % injection 3 mL  3 mL Intravenous PRN Leonie Man, MD      . traZODone (DESYREL) tablet 50 mg  50 mg Oral QHS PRN Leonie Man, MD   50 mg at 03/23/20 2145     Discharge Medications: Please see discharge summary for a list of discharge medications.  Relevant Imaging Results:  Relevant Lab Results:   Additional Information SS# [place] Dameka Younker, Student-Social Work

## 2020-03-27 NOTE — Plan of Care (Signed)
  Problem: Education: Goal: Knowledge of General Education information will improve Description: Including pain rating scale, medication(s)/side effects and non-pharmacologic comfort measures Outcome: Progressing   Problem: Health Behavior/Discharge Planning: Goal: Ability to manage health-related needs will improve Outcome: Progressing   Problem: Clinical Measurements: Goal: Will remain free from infection Outcome: Progressing Goal: Diagnostic test results will improve Outcome: Progressing   Problem: Clinical Measurements: Goal: Diagnostic test results will improve Outcome: Progressing   Problem: Clinical Measurements: Goal: Will remain free from infection Outcome: Progressing Goal: Diagnostic test results will improve Outcome: Progressing   Problem: Coping: Goal: Level of anxiety will decrease Outcome: Progressing   Problem: Pain Managment: Goal: General experience of comfort will improve Outcome: Progressing

## 2020-03-27 NOTE — Progress Notes (Addendum)
Inpatient Rehabilitation Admissions Coordinator  Inpatient rehab consult received. I met with patient at bedside for rehab assessment. We discussed goals and expectations of a possible inpt rehab admit. Patient would like to discuss with her daughter Anita [name], I have left a voicemail for Anita [name] to contact me to further discuss.  Danne Baxter, RN, MSN Rehab Admissions Coordinator [phone] 03/27/2020 12:09 PM  I met with patient and her daughter , Anita [name] at bedside. Daughter would like patient to go to SNF rehab for prolonged recovery and is looking for 3 weeks or more of rehab before returning home with her daughter. I have alerted Dr. Sherral Hammers, RN CM , Neoma Laming and SW, [place]. Please call me with any questions. We will sign off at this time.  Danne Baxter, RN, MSN Rehab Admissions Coordinator [phone] 03/27/2020 1:35 PM

## 2020-03-27 NOTE — Plan of Care (Signed)
  Problem: Nutrition: Goal: Adequate nutrition will be maintained Outcome: Progressing   Problem: Coping: Goal: Level of anxiety will decrease Outcome: Progressing   Problem: Pain Managment: Goal: General experience of comfort will improve Outcome: Progressing   Problem: Safety: Goal: Ability to remain free from injury will improve Outcome: Progressing   Problem: Education: Goal: Knowledge of General Education information will improve Description: Including pain rating scale, medication(s)/side effects and non-pharmacologic comfort measures Outcome: Progressing

## 2020-03-27 NOTE — Progress Notes (Signed)
PROGRESS NOTE    Anita [name]  [id number] DOB: January 16, 1961 DOA: 03/20/2020 PCP: [name], NP   Brief Narrative:  Anita [name] is a 59 y.o. WF PMHx Multiple Sclerosis with Optic neuritis, RIGHT ear hearing loss, migraine headache, obese, GERD, arthritis, tobacco abuse, and allergies   Presents with complaints of shortness of breath.  Reports having intermittent episodes of shortness of breath lasting approximately 2 minutes before self resolving over the last 3-4 days.  During episode she would have palpitations, diaphoresis, and feel anxious.  Symptoms would gradually resolve on their own.  Over this weekend she also has reported having diarrhea 3-4 episodes per day, nausea, and mostly nonproductive cough.  Diarrhea was noted to be yellowish-orange in she did not notice any blood.  She had tried taking Mucinex, but thought that it may have been causing her to have diarrhea.  Denied having any recent sick contacts or knowledge of chest pain, abdominal pain, leg swelling, weight gain, dysuria, or vomiting,.  However, last night while trying to go to bed developed severe shortness of breath which did not go away. Laying seem to worsen symptoms.   Reports smoking half a pack cigarettes per day on average.  She has been off of treatment for MS for at least 10 years now because she felt that they did not help.  Ambulates with difficulty using a walker.  She has been muscle spasms and utilizes Tylenol to treat symptoms.  Patient notes family history significant for mother who had a heart attack at age 49.   ED Course: Upon admission into the emergency department patient was seen to be afebrile with pulse 113-153, respirations 23-30, blood pressures maintained, and O2 saturations 93-98% on room air.  Labs significant for WBC 8.1, hemoglobin 10.3, CO2 16, BUN 21, creatinine 1.3, glucose 195, anion gap 18, troponin 2581-> 2513, and BNP 1255.6.  Chest x-ray showed diffuse  interstitial process concerning for atypical pneumonia versus edema.  Patient had been given heparin bolus of 4000 units, Rocephin azithromycin, and 1 L normal saline IV fluids.  Cardiology has been consulted due to the elevated troponin.  TRH called to admit.   Subjective: 4/20 A/O x4, negative CP positive S OB.  Positive productive cough.  States had one episode of hemoptysis this A.m.  Assessment & Plan:   Principal Problem:   Acute respiratory distress Active Problems:   TOBACCO ABUSE   Multiple sclerosis (HCC)   Acute respiratory failure with hypoxia (HCC)   Prolonged QT interval   NSTEMI (non-ST elevated myocardial infarction) (HCC)   SIRS (systemic inflammatory response syndrome) (HCC)   Normocytic anemia   Pneumonia   AKI (acute kidney injury) ([place])   Respiratory distress   Atypical pneumonia   Tobacco abuse   Elevated troponin   Acute systolic CHF (congestive heart failure) (HCC)   Pulmonary hypertension (HCC)   Retroperitoneal mass   Insomnia  Acute respiratory failure with hypoxia -Xopenex QID -Flutter valve -Incentive spirometer -Complete 7-day course antibiotics.  Azithromycin discontinued secondary to prolonged QT interval. -Discussed case with pharmacy and patient has been on high dose of steroids in the past for her MS.  Solu-Medrol 60 mg daily. -->  DC 4/19 -4/16 Pulmicort nebulizer BID -4/17 PCXR; bilateral interstitial densities see results below  -Continuous pulse ox -4/17 maintain patient on 2 L O2 at all times desats whenever she talks, most likely desats when sleeping. -Titrate O2 to maintain SPO2> 94% -SATURATION QUALIFICATIONS: (This note is used to comply  with regulatory documentation for home oxygen) Patient Saturations on Room Air at Rest = 96% Patient Saturations on Room Air while Ambulating = 92% Pt was not able to ambulate in halls at rest in bed saturations 96% on room air, needed to assist x2 to chair has too weak to ambulate saturations  remained above 90% on room air. -Does not qualify for home O2  Atypical pneumonia -Patient CT angiogram 4/13 consistent with viral/atypical pneumonia see results below -See acute respiratory distress -All viral panels negative see results below -4/19 PCXR; slight improvement see results below -4/20 ambulate patient q shift   SIRS:  -Initially presented tachycardia, tachypnea, WBC were WNL. -Most likely respiratory cause. -Blood cultures NGTD   Tobacco abuse -Patient was counseled on need to absolutely discontinue smoking forever.  Had been smoking 1/2 PPD since 59 years old -Nicotine patch  Elevated troponin -Initially thought to be demand ischemia however echocardiogram shows acute systolic CHF and acute pulmonary HTN. -4/14 cardiology notified concerning findings awaiting recommendations on if cardiac catheterization to occur. -Cardiology recommends [facility]   Acute systolic CHF/NSTEMI/severe three-vessel disease -EF 40 to 45% see echocardiogram results below -Strict in and out +2.6 L -Daily weight Filed Weights   03/25/20 0500 03/26/20 0500 03/27/20 0538  Weight: 68.9 kg 70 kg 70.6 kg  -See elevated troponin -4/18 increase Coreg 6.25 mg BID, HR still hovering around 100  -4/17 losartan 25 mg daily -4/16 cardiac catheterization showed severe three-vessel disease, given patient's other multiple medical conditions cardiology feels patient would not survive CABG see results below -Aggressive medical management -4/18 Nutrition; patient requests to speak with nutritionist concerning cardiac diet at home -4/18 cardiology signed off -Cardiology has arranged home health RN for CHF -Follow-up with NP Alma Friendly 4/27 @1020   Acute pulmonary hypertension -See CHF/elevated troponin  Acute kidney injury vs CKD -Previous creatinine= 1.08 on 01/03/2011 -Most likely patient's renal function has been worsening over the years and she has not known this. Recent Labs  Lab 03/23/20 0218  03/24/20 0346 03/25/20 0226 03/26/20 0235 03/27/20 0849  CREATININE 1.24* 1.25* 1.43* 1.53* 1.58*  -Slight increase in creatinine most likely secondary to starting ARB.  If creatinine continues to trend up will decrease losartan to 12.5 mg  Prolonged QT interval: Acute.  QTc initially 586. 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 -Was seen by Dr. Eston Esters oncology in 2007; no longer practices -4/18 discussed case with NP Katie [facility]CS feels that patient probably not a surgical candidate, best course of action would be to reestablish care with Oncologist first if patient desires and then come up with plan..   -4/18 after explaining options patient not interested in pursuing further diagnosis/therapeutic options.  Metabolic acidosis with elevated anion gap -4/14 resolved  Multiple sclerosis:  -Patient reports that she has been off of any kind of treatment over the last 10 years as she felt they were having no effect.  Patient reports that she has been using Tylenol and ibuprofen. -Robaxin as needed for muscle spasms   Normocytic anemia: Acute.   -Hemoglobin 10.3 g/dL on admission, but previously noted to be within normal limits. Recent Labs  Lab 03/23/20 0218 03/24/20 0346 03/25/20 0226 03/26/20 0235 03/27/20 0244  HGB 8.5* 8.3* 7.9* 8.4* 8.6*  -Transfuse for hemoglobin<7  Diarrhea  -C. difficile negative -GI panel negative  Insomnia -Trazodone 50 mg QHS PRN  Hypokalemia Potassium goal> 4  HLD -Rosuvastatin 20 mg daily per cardiology.  Goals of care -4/19 PT/OT; patient with severe three-vessel cardiac disease as well as emphysema/COPD, multiple other medical problems evaluate for CIR vs SNF    DVT prophylaxis: Subcu heparin Code Status:  Family Communication: 4/18 [name] (daughter) at bedside counseled on plan of care  answered all questions Disposition Plan:  1.  Where the patient is from 2.  Anticipated d/c place. 3.  Barriers to d/c awaiting placement in CIR or SNF   Consultants:  Cardiology Phone consult CCS NP Katie [facility]   Procedures/Significant Events:  4/13 Echocardiogram;Left Ventricle: LVEF=40 -45%.-Global hypokinesis.  -Left ventricular diastolic function could not be evaluated due to mitral stenosis.  Right Ventricle:  moderately elevated pulmonary artery systolic pressure.  The tricuspid regurgitant velocity is 3.30 m/s, and with an assumed right atrial pressure of 15 mmHg, the estimated right ventricular systolic pressure is 0000000 mmHg.  Mitral Valve: Rheumatic. There is moderate thickening of the mitral valve leaflet(s). Moderate mitral subvalvular thickening/fibrosis. Moderate to severe mitral valve regurgitation, with  posteriorly-directed jet. Mild mitral valve stenosis. The mean mitral valve gradient is 7.3 mmHg with average heart  rate of 105 bpm.  Venous: The inferior vena cava is dilated in size with less than 50% respiratory variability, suggesting right atrial pressure of 15 mmHg.  4/14 CTA chest PE protocol;-ground-glass opacities with history favoring atypical/viral pneumonia. -Small pleural effusions and interlobular septal thickening not typically associated with viral pneumonia, findings could reflect atypical edema or failure superimposed on infection. -Left retroperitoneal mass, ganglioneuroma at 2007 pathology.There is residual or recurrent mass adjacent to the retroperitoneal clips, 5.6 cm. Was the patient is prior resection subtotal? 4/16 cardiac catheterization;- LV end diastolic pressure is moderately elevated. -Ost RCA to Dist RCA lesion is 100% stenosed. - PDA and PL system fills via faint collaterals from the AV groove LCx and LAD septals. -Mid LAD-1 lesion is 60% stenosed. Mid LAD-2 lesion is 50% stenosed. Dist LAD lesion is 55% stenosed with 70%  stenosed side branch in 2nd Diag. -1st Diag lesion is 70% stenosed. -Ost Cx to Prox Cx lesion is 45% stenosed. Prox-MID Cx lesion is 65% stenosed.    MODERATE-SEVERE THREE-VESSEL CAD: ? 100% proximal RCA occlusion with left-to-right collaterals faintly filling PDA and PL system (AV groove LCx-PL and LAD septal-PDA) ? Diffuse moderate mid LAD disease with 60% to 50% stenosis. ? Tandem 50% and 65% proximal and mid LCx with the 65% lesion being the most significant lesion.  Moderately elevated LVEDP of 18-20 mmHg  Given the extent of disease in the LAD and LCx, neither lesion is very amenable to PCI, and RCA is chronically occluded.  Given her comorbidities, I do not think she would be a good CABG candidate especially in light of the fact that she would likely require valve surgery as well.  She would not recover well.  4/17 PCXR;Stable bilateral interstitial densities are noted concerning for edema or possibly atypical infection 4/19 PCXR;-persistent interstitial densities throughout both lungs -Slightly improved aeration in the perihilar regions.   I have personally reviewed and interpreted all radiology studies and my findings are as above.  VENTILATOR SETTINGS:    Cultures 4/13 SARS coronavirus negative 4/13 influenza A/B negative 4/13 respiratory virus panel negative 4/13 HIV nonreactive 4/13 blood right forearm NGTD 4/13 blood left arm NGTD 4/14 C. difficile antigen/toxin negative 4/14 GI panel negative 4/18 sputum pending     Antimicrobials: Anti-infectives (From admission, onward)   Start  Stop   03/21/20 0630  cefTRIAXone (ROCEPHIN) 1 g in sodium chloride 0.9 % 100 mL IVPB         03/20/20 1430  doxycycline (VIBRA-TABS) tablet 100 mg         03/20/20 0545  cefTRIAXone (ROCEPHIN) 1 g in sodium chloride 0.9 % 100 mL IVPB     03/20/20 0730   03/20/20 0545  azithromycin (ZITHROMAX) tablet 500 mg     03/20/20 [id number]       Devices    LINES / TUBES:       Continuous Infusions: . sodium chloride 1,000 mL (03/25/20 0737)  . cefTRIAXone (ROCEPHIN)  IV 1 g (03/27/20 [id number])     Objective: Vitals:   03/27/20 0733 03/27/20 0738 03/27/20 0741 03/27/20 0826  BP: 133/85 133/85    Pulse: 93 87  84  Resp: (!) 27 (!) 24    Temp: (!) 97.5 F (36.4 C)     TempSrc: Oral     SpO2: 91% 98% 98%   Weight:      Height:        Intake/Output Summary (Last 24 hours) at 03/27/2020 [id number] Last data filed at 03/26/2020 1900 Gross per 24 hour  Intake 235.51 ml  Output --  Net 235.51 ml   Filed Weights   03/25/20 0500 03/26/20 0500 03/27/20 0538  Weight: 68.9 kg 70 kg 70.6 kg   Physical Exam:  General: A/O x4, positive acute respiratory distress Eyes: negative scleral hemorrhage, negative anisocoria, negative icterus ENT: Negative Runny nose, negative gingival bleeding, Neck:  Negative scars, masses, torticollis, lymphadenopathy, JVD Lungs: Clear to auscultation bilaterally without wheezes or crackles Cardiovascular: Regular rate and rhythm without murmur gallop or rub normal S1 and S2 Abdomen: negative abdominal pain, nondistended, positive soft, bowel sounds, no rebound, no ascites, no appreciable mass Extremities: No significant cyanosis, clubbing, or edema bilateral lower extremities Skin: Negative rashes, lesions, ulcers Psychiatric:  Negative depression, negative anxiety, negative fatigue, negative mania  Central nervous system:  Cranial nerves II through XII intact, tongue/uvula midline, all extremities muscle strength 5/5, sensation intact throughout, negative dysarthria, negative expressive aphasia, negative receptive aphasia.       Data Reviewed: Care during the described time interval was provided by me .  I have reviewed this patient's available data, including medical history, events of note, physical examination, and all test results as part of my evaluation.   CBC: Recent Labs  Lab 03/23/20 0218 03/24/20 0346  03/25/20 0226 03/26/20 0235 03/27/20 0244  WBC 7.1 12.5* 11.3* 15.9* 17.0*  NEUTROABS  --   --   --  13.2* 13.3*  HGB 8.5* 8.3* 7.9* 8.4* 8.6*  HCT 26.6* 26.0* 25.2* 26.9* 27.5*  MCV 93.7 93.9 93.7 95.7 95.5  PLT 285 357 342 416* [id number]*   Basic Metabolic Panel: Recent Labs  Lab 03/22/20 0233 03/22/20 0233 03/23/20 0218 03/24/20 0346 03/25/20 0226 03/26/20 0235 03/27/20 0244  NA 137  --  137 138 135 136  --   K 3.9  --  3.4* 4.4 4.7 4.9  --   CL 105  --  106 110 107 107  --   CO2 19*  --  18* 16* 16* 16*  --   GLUCOSE 106*  --  114* 133* 146* 128*  --   BUN 18  --  17 24* 34* 54*  --   CREATININE 1.21*  --  1.24* 1.25* 1.43* 1.53*  --   CALCIUM 8.7*  --  8.3* 9.0 8.9  9.0  --   MG 2.1   < > 2.2 2.1 2.1 2.4 2.5*  PHOS 3.6   < > 3.3 4.1 5.3* 4.9* 5.0*   < > = values in this interval not displayed.   GFR: Estimated Creatinine Clearance: 38.2 mL/min (A) (by C-G formula based on SCr of 1.53 mg/dL (H)). Liver Function Tests: Recent Labs  Lab 03/22/20 0233 03/23/20 0218 03/24/20 0346 03/25/20 0226 03/26/20 0235  AST 30 57* 41 265* 203*  ALT 17 39 41 181* 225*  ALKPHOS 55 64 68 59 84  BILITOT 0.7 0.4 0.5 0.7 0.2*  PROT 6.3* 6.1* 6.6 6.1* 6.2*  ALBUMIN 2.8* 2.7* 2.7* 2.7* 2.8*   No results for input(s): LIPASE, AMYLASE in the last 168 hours. No results for input(s): AMMONIA in the last 168 hours. Coagulation Profile: No results for input(s): INR, PROTIME in the last 168 hours. Cardiac Enzymes: No results for input(s): CKTOTAL, CKMB, CKMBINDEX, TROPONINI in the last 168 hours. BNP (last 3 results) No results for input(s): PROBNP in the last 8760 hours. HbA1C: No results for input(s): HGBA1C in the last 72 hours. CBG: No results for input(s): GLUCAP in the last 168 hours. Lipid Profile: No results for input(s): CHOL, HDL, LDLCALC, TRIG, CHOLHDL, LDLDIRECT in the last 72 hours. Thyroid Function Tests: No results for input(s): TSH, T4TOTAL, FREET4, T3FREE, THYROIDAB in  the last 72 hours. Anemia Panel: No results for input(s): VITAMINB12, FOLATE, FERRITIN, TIBC, IRON, RETICCTPCT in the last 72 hours. Urine analysis:    Component Value Date/Time   COLORURINE YELLOW 03/20/2020 1600   APPEARANCEUR CLEAR 03/20/2020 1600   LABSPEC 1.026 03/20/2020 1600   PHURINE 5.0 03/20/2020 1600   GLUCOSEU NEGATIVE 03/20/2020 1600   HGBUR MODERATE (A) 03/20/2020 1600   BILIRUBINUR NEGATIVE 03/20/2020 1600   KETONESUR NEGATIVE 03/20/2020 1600   PROTEINUR NEGATIVE 03/20/2020 1600   NITRITE NEGATIVE 03/20/2020 1600   LEUKOCYTESUR SMALL (A) 03/20/2020 1600   Sepsis Labs: @LABRCNTIP (procalcitonin:4,lacticidven:4)  ) Recent Results (from the past 240 hour(s))  Respiratory Panel by RT PCR (Flu A&B, Covid) -     Status: None   Collection Time: 03/20/20  5:54 AM  Result Value Ref Range Status   SARS Coronavirus 2 by RT PCR NEGATIVE NEGATIVE Final    Comment: (NOTE) SARS-CoV-2 target nucleic acids are NOT DETECTED. The SARS-CoV-2 RNA is generally detectable in upper respiratoy specimens during the acute phase of infection. The lowest concentration of SARS-CoV-2 viral copies this assay can detect is 131 copies/mL. A negative result does not preclude SARS-Cov-2 infection and should not be used as the sole basis for treatment or other patient management decisions. A negative result may occur with  improper specimen collection/handling, submission of specimen other than nasopharyngeal swab, presence of viral mutation(s) within the areas targeted by this assay, and inadequate number of viral copies (<131 copies/mL). A negative result must be combined with clinical observations, patient history, and epidemiological information. The expected result is Negative. Fact Sheet for Patients:  PinkCheek.be Fact Sheet for Healthcare Providers:  GravelBags.it This test is not yet ap proved or cleared by the [place] FDA  and  has been authorized for detection and/or diagnosis of SARS-CoV-2 by FDA under an Emergency Use Authorization (EUA). This EUA will remain  in effect (meaning this test can be used) for the duration of the COVID-19 declaration under Section 564(b)(1) of the Act, 21 U.S.C. section 360bbb-3(b)(1), unless the authorization is terminated or revoked sooner.    Influenza A by PCR  NEGATIVE NEGATIVE Final   Influenza B by PCR NEGATIVE NEGATIVE Final    Comment: (NOTE) The Xpert Xpress SARS-CoV-2/FLU/RSV assay is intended as an aid in  the diagnosis of influenza from Nasopharyngeal swab specimens and  should not be used as a sole basis for treatment. Nasal washings and  aspirates are unacceptable for Xpert Xpress SARS-CoV-2/FLU/RSV  testing. Fact Sheet for Patients: PinkCheek.be Fact Sheet for Healthcare Providers: GravelBags.it This test is not yet approved or cleared by the [place] FDA and  has been authorized for detection and/or diagnosis of SARS-CoV-2 by  FDA under an Emergency Use Authorization (EUA). This EUA will remain  in effect (meaning this test can be used) for the duration of the  Covid-19 declaration under Section 564(b)(1) of the Act, 21  U.S.C. section 360bbb-3(b)(1), unless the authorization is  terminated or revoked. Performed at [facility], [place] [address], [place], [place] [address]   Respiratory Panel by PCR     Status: None   Collection Time: 03/20/20  5:54 AM   Specimen: Nasopharyngeal Swab; Respiratory  Result Value Ref Range Status   Adenovirus NOT DETECTED NOT DETECTED Final   Coronavirus 229E NOT DETECTED NOT DETECTED Final    Comment: (NOTE) The Coronavirus on the Respiratory Panel, DOES NOT test for the novel  Coronavirus (2019 nCoV)    Coronavirus HKU1 NOT DETECTED NOT DETECTED Final   Coronavirus NL63 NOT DETECTED NOT DETECTED Final   Coronavirus OC43 NOT DETECTED NOT DETECTED  Final   Metapneumovirus NOT DETECTED NOT DETECTED Final   Rhinovirus / Enterovirus NOT DETECTED NOT DETECTED Final   Influenza A NOT DETECTED NOT DETECTED Final   Influenza B NOT DETECTED NOT DETECTED Final   Parainfluenza Virus 1 NOT DETECTED NOT DETECTED Final   Parainfluenza Virus 2 NOT DETECTED NOT DETECTED Final   Parainfluenza Virus 3 NOT DETECTED NOT DETECTED Final   Parainfluenza Virus 4 NOT DETECTED NOT DETECTED Final   Respiratory Syncytial Virus NOT DETECTED NOT DETECTED Final   Bordetella pertussis NOT DETECTED NOT DETECTED Final   Chlamydophila pneumoniae NOT DETECTED NOT DETECTED Final   Mycoplasma pneumoniae NOT DETECTED NOT DETECTED Final    Comment: Performed at [facility], [place]. [address], [place], [place] [address]  Blood Culture (routine x 2)     Status: None   Collection Time: 03/20/20  7:31 AM   Specimen: BLOOD RIGHT FOREARM  Result Value Ref Range Status   Specimen Description BLOOD RIGHT FOREARM  Final   Special Requests   Final    BOTTLES DRAWN AEROBIC AND ANAEROBIC Blood Culture adequate volume   Culture   Final    NO GROWTH 5 DAYS Performed at [facility], [place] [address] [place], [place] [address]    Report Status 03/25/2020 FINAL  Final  Blood Culture (routine x 2)     Status: None   Collection Time: 03/20/20  8:06 AM   Specimen: BLOOD LEFT ARM  Result Value Ref Range Status   Specimen Description BLOOD LEFT ARM  Final   Special Requests   Final    BOTTLES DRAWN AEROBIC AND ANAEROBIC Blood Culture adequate volume   Culture   Final    NO GROWTH 5 DAYS Performed at [facility], [place] [address], [place], [place] [address]    Report Status 03/25/2020 FINAL  Final  MRSA PCR Screening     Status: None   Collection Time: 03/20/20  3:52 PM   Specimen: Nasopharyngeal  Result Value Ref Range Status   MRSA by PCR NEGATIVE NEGATIVE Final    Comment:        The GeneXpert MRSA Assay (FDA approved for NASAL specimens only), is one  component of a comprehensive MRSA colonization surveillance program. It is not intended to diagnose MRSA infection nor to guide or monitor treatment for MRSA infections. Performed at [facility], [place] [address], [place], [place] [address]   Gastrointestinal Panel by PCR , Stool     Status: None   Collection Time: 03/21/20  1:50 PM   Specimen: STOOL  Result Value Ref Range Status   Campylobacter species NOT DETECTED NOT DETECTED Final   Plesimonas shigelloides NOT DETECTED NOT DETECTED Final   Salmonella species NOT DETECTED NOT DETECTED Final   Yersinia enterocolitica NOT DETECTED NOT DETECTED Final   Vibrio species NOT DETECTED NOT DETECTED Final   Vibrio cholerae NOT DETECTED NOT DETECTED Final   Enteroaggregative E coli (EAEC) NOT DETECTED NOT DETECTED Final   Enteropathogenic E coli (EPEC) NOT DETECTED NOT DETECTED Final   Enterotoxigenic E coli (ETEC) NOT DETECTED NOT DETECTED Final   Shiga like toxin producing E coli (STEC) NOT DETECTED NOT DETECTED Final   Shigella/Enteroinvasive E coli (EIEC) NOT DETECTED NOT DETECTED Final   Cryptosporidium NOT DETECTED NOT DETECTED Final   Cyclospora cayetanensis NOT DETECTED NOT DETECTED Final   Entamoeba histolytica NOT DETECTED NOT DETECTED Final   Giardia lamblia NOT DETECTED NOT DETECTED Final   Adenovirus F40/41 NOT DETECTED NOT DETECTED Final   Astrovirus NOT DETECTED NOT DETECTED Final   Norovirus GI/GII NOT DETECTED NOT DETECTED Final   Rotavirus A NOT DETECTED NOT DETECTED Final   Sapovirus (I, II, IV, and V) NOT DETECTED NOT DETECTED Final    Comment: Performed at [facility], [place] [place], [place] [address]  C Difficile Quick Screen w PCR reflex     Status: None   Collection Time: 03/21/20  1:50 PM   Specimen: STOOL  Result Value Ref Range Status   C Diff antigen NEGATIVE NEGATIVE Final   C Diff toxin NEGATIVE NEGATIVE Final   C Diff interpretation No C. difficile detected.  Final    Comment:  Performed at [facility], [place] [address], [place], [place] [address]  Expectorated sputum assessment w rflx to resp cult     Status: None   Collection Time: 03/25/20  4:00 PM   Specimen: Expectorated Sputum  Result Value Ref Range Status   Specimen Description Expect. Sput  Final   Special Requests NONE  Final   Sputum evaluation   Final    Sputum specimen not acceptable for testing.  Please recollect.   RESULT CALLED TO, READ BACK BY AND VERIFIED WITH: Su Hilt RN @ [id number] ON 03/25/20 BT ROBINSON Z.  Performed at [facility], [place] [address], [place], [place] [address]    Report Status 03/26/2020 FINAL  Final  Culture, respiratory     Status: None (Preliminary result)   Collection Time: 03/26/20  7:25 AM   Specimen: INDUCED SPUTUM  Result Value Ref Range Status   Specimen Description INDUCED SPUTUM  Final   Special Requests NONE  Final   Gram Stain   Final    FEW WBC PRESENT,BOTH PMN AND MONONUCLEAR FEW SQUAMOUS EPITHELIAL CELLS PRESENT FEW BUDDING YEAST SEEN RARE GRAM POSITIVE RODS Performed at [facility], [place] [address] [address] [place], [place] [id number]    Culture PENDING  Incomplete   Report Status PENDING  Incomplete         Radiology Studies: DG CHEST PORT 1 VIEW  Result Date: 03/26/2020 CLINICAL DATA:  Pneumonia. EXAM: PORTABLE CHEST 1 VIEW COMPARISON:  03/24/2020 FINDINGS: Diffuse interstitial lung densities. Aeration in the perihilar region has slightly improved. Heart size is upper limits of normal but stable. Trachea remains midline. Negative for pneumothorax. Bone structures are unremarkable. IMPRESSION: Persistent interstitial densities throughout both lungs with slightly improved aeration in the perihilar regions. Electronically Signed   By: Markus Daft M.D.   On: 03/26/2020 08:41        Scheduled Meds: . aspirin EC  81 mg Oral Daily  . budesonide (PULMICORT) nebulizer solution  0.25 mg Nebulization BID  . calcium carbonate  1 tablet Oral  TID WC  . carvedilol  6.25 mg Oral BID WC  . doxycycline  100 mg Oral Q12H  . feeding supplement (ENSURE ENLIVE)  237 mL Oral BID BM  . heparin  5,000 Units Subcutaneous Q8H  . levalbuterol  0.63 mg Nebulization BID  . multivitamin with minerals  1 tablet Oral Daily  . nicotine  21 mg Transdermal Daily  . rosuvastatin  20 mg Oral Daily  . sodium chloride flush  3 mL Intravenous Q12H  . sodium chloride flush  3 mL Intravenous Q12H  . sodium chloride flush  3 mL Intravenous Q12H   Continuous Infusions: . sodium chloride 1,000 mL (03/25/20 0737)  . cefTRIAXone (ROCEPHIN)  IV 1 g (03/27/20 0648)     LOS: 7 days   The patient is critically ill with multiple organ systems failure and requires high complexity decision making for assessment and support, frequent evaluation and titration of therapies, application of advanced monitoring technologies and extensive interpretation of multiple databases. Critical Care Time devoted to patient care services described in this note  Time spent: 40 minutes     Darrian Goodwill, Geraldo Docker, MD [facility] Pager [phone]  If 7PM-7AM, please contact night-coverage [email or web address] Password [facility] 03/27/2020, 8:33 AM

## 2020-03-28 DIAGNOSIS — D649 Anemia, unspecified: Secondary | ICD-10-CM | POA: Diagnosis not present

## 2020-03-28 DIAGNOSIS — D509 Iron deficiency anemia, unspecified: Secondary | ICD-10-CM | POA: Diagnosis present

## 2020-03-28 DIAGNOSIS — F17200 Nicotine dependence, unspecified, uncomplicated: Secondary | ICD-10-CM | POA: Diagnosis not present

## 2020-03-28 DIAGNOSIS — I2510 Atherosclerotic heart disease of native coronary artery without angina pectoris: Secondary | ICD-10-CM | POA: Diagnosis not present

## 2020-03-28 DIAGNOSIS — D3610 Benign neoplasm of peripheral nerves and autonomic nervous system, unspecified: Secondary | ICD-10-CM | POA: Diagnosis present

## 2020-03-28 DIAGNOSIS — M2550 Pain in unspecified joint: Secondary | ICD-10-CM | POA: Diagnosis not present

## 2020-03-28 DIAGNOSIS — J9601 Acute respiratory failure with hypoxia: Secondary | ICD-10-CM | POA: Diagnosis not present

## 2020-03-28 DIAGNOSIS — M6259 Muscle wasting and atrophy, not elsewhere classified, multiple sites: Secondary | ICD-10-CM | POA: Diagnosis present

## 2020-03-28 DIAGNOSIS — I272 Pulmonary hypertension, unspecified: Secondary | ICD-10-CM | POA: Diagnosis present

## 2020-03-28 DIAGNOSIS — R7989 Other specified abnormal findings of blood chemistry: Secondary | ICD-10-CM | POA: Diagnosis present

## 2020-03-28 DIAGNOSIS — R0603 Acute respiratory distress: Secondary | ICD-10-CM | POA: Diagnosis not present

## 2020-03-28 DIAGNOSIS — M6281 Muscle weakness (generalized): Secondary | ICD-10-CM | POA: Diagnosis present

## 2020-03-28 DIAGNOSIS — R2689 Other abnormalities of gait and mobility: Secondary | ICD-10-CM | POA: Diagnosis present

## 2020-03-28 DIAGNOSIS — Z741 Need for assistance with personal care: Secondary | ICD-10-CM | POA: Diagnosis present

## 2020-03-28 DIAGNOSIS — R778 Other specified abnormalities of plasma proteins: Secondary | ICD-10-CM | POA: Diagnosis not present

## 2020-03-28 DIAGNOSIS — G35 Multiple sclerosis: Secondary | ICD-10-CM | POA: Diagnosis not present

## 2020-03-28 DIAGNOSIS — I214 Non-ST elevation (NSTEMI) myocardial infarction: Secondary | ICD-10-CM | POA: Diagnosis present

## 2020-03-28 DIAGNOSIS — Z7401 Bed confinement status: Secondary | ICD-10-CM | POA: Diagnosis not present

## 2020-03-28 DIAGNOSIS — Z720 Tobacco use: Secondary | ICD-10-CM | POA: Diagnosis not present

## 2020-03-28 DIAGNOSIS — R531 Weakness: Secondary | ICD-10-CM | POA: Diagnosis not present

## 2020-03-28 DIAGNOSIS — I5021 Acute systolic (congestive) heart failure: Secondary | ICD-10-CM | POA: Diagnosis present

## 2020-03-28 DIAGNOSIS — I509 Heart failure, unspecified: Secondary | ICD-10-CM | POA: Diagnosis not present

## 2020-03-28 DIAGNOSIS — R5381 Other malaise: Secondary | ICD-10-CM | POA: Diagnosis not present

## 2020-03-28 DIAGNOSIS — I10 Essential (primary) hypertension: Secondary | ICD-10-CM | POA: Diagnosis not present

## 2020-03-28 DIAGNOSIS — R1909 Other intra-abdominal and pelvic swelling, mass and lump: Secondary | ICD-10-CM | POA: Diagnosis present

## 2020-03-28 DIAGNOSIS — J189 Pneumonia, unspecified organism: Secondary | ICD-10-CM | POA: Diagnosis not present

## 2020-03-28 LAB — CBC WITH DIFFERENTIAL/PLATELET
Abs Immature Granulocytes: 0.54 10*3/uL — ABNORMAL HIGH (ref 0.00–0.07)
Basophils Absolute: 0 10*3/uL (ref 0.0–0.1)
Basophils Relative: 0 %
Eosinophils Absolute: 0 10*3/uL (ref 0.0–0.5)
Eosinophils Relative: 0 %
HCT: 29.3 % — ABNORMAL LOW (ref 36.0–46.0)
Hemoglobin: 9.2 g/dL — ABNORMAL LOW (ref 12.0–15.0)
Immature Granulocytes: 3 %
Lymphocytes Relative: 14 %
Lymphs Abs: 2.9 10*3/uL (ref 0.7–4.0)
MCH: 29.8 pg (ref 26.0–34.0)
MCHC: 31.4 g/dL (ref 30.0–36.0)
MCV: 94.8 fL (ref 80.0–100.0)
Monocytes Absolute: 1.6 10*3/uL — ABNORMAL HIGH (ref 0.1–1.0)
Monocytes Relative: 8 %
Neutro Abs: 15.5 10*3/uL — ABNORMAL HIGH (ref 1.7–7.7)
Neutrophils Relative %: 75 %
Platelets: 448 10*3/uL — ABNORMAL HIGH (ref 150–400)
RBC: 3.09 MIL/uL — ABNORMAL LOW (ref 3.87–5.11)
RDW: 16.1 % — ABNORMAL HIGH (ref 11.5–15.5)
WBC: 20.6 10*3/uL — ABNORMAL HIGH (ref 4.0–10.5)
nRBC: 1 % — ABNORMAL HIGH (ref 0.0–0.2)

## 2020-03-28 LAB — COMPREHENSIVE METABOLIC PANEL
ALT: 208 U/L — ABNORMAL HIGH (ref 0–44)
AST: 108 U/L — ABNORMAL HIGH (ref 15–41)
Albumin: 3 g/dL — ABNORMAL LOW (ref 3.5–5.0)
Alkaline Phosphatase: 235 U/L — ABNORMAL HIGH (ref 38–126)
Anion gap: 9 (ref 5–15)
BUN: 68 mg/dL — ABNORMAL HIGH (ref 6–20)
CO2: 21 mmol/L — ABNORMAL LOW (ref 22–32)
Calcium: 9.4 mg/dL (ref 8.9–10.3)
Chloride: 105 mmol/L (ref 98–111)
Creatinine, Ser: 1.51 mg/dL — ABNORMAL HIGH (ref 0.44–1.00)
GFR calc Af Amer: 44 mL/min — ABNORMAL LOW (ref >60)
GFR calc non Af Amer: 38 mL/min — ABNORMAL LOW (ref >60)
Glucose, Bld: 116 mg/dL — ABNORMAL HIGH (ref 70–99)
Potassium: 5.1 mmol/L (ref 3.5–5.1)
Sodium: 135 mmol/L (ref 135–145)
Total Bilirubin: 0.8 mg/dL (ref 0.3–1.2)
Total Protein: 6.5 g/dL (ref 6.5–8.1)

## 2020-03-28 LAB — MAGNESIUM: Magnesium: 2.7 mg/dL — ABNORMAL HIGH (ref 1.7–2.4)

## 2020-03-28 LAB — PHOSPHORUS: Phosphorus: 3.9 mg/dL (ref 2.5–4.6)

## 2020-03-28 MED ORDER — ROSUVASTATIN CALCIUM 20 MG PO TABS: 20.0000 mg | ORAL_TABLET | Freq: Every day | ORAL | Status: DC

## 2020-03-28 MED ORDER — ASPIRIN 81 MG PO TBEC: 81.0000 mg | DELAYED_RELEASE_TABLET | Freq: Every day | ORAL | Status: DC

## 2020-03-28 MED ORDER — NICOTINE 21 MG/24HR TD PT24: 21.0000 mg | MEDICATED_PATCH | Freq: Every day | TRANSDERMAL | 0 refills | Status: DC

## 2020-03-28 MED ORDER — LOSARTAN POTASSIUM 25 MG PO TABS: 12.5000 mg | ORAL_TABLET | Freq: Every day | ORAL | Status: DC

## 2020-03-28 MED ORDER — ENSURE ENLIVE PO LIQD: 237.0000 mL | Freq: Two times a day (BID) | ORAL | Status: AC

## 2020-03-28 MED ORDER — BUDESONIDE 0.25 MG/2ML IN SUSP: 0.5000 mg | Freq: Two times a day (BID) | RESPIRATORY_TRACT | Status: DC

## 2020-03-28 MED ORDER — CARVEDILOL 6.25 MG PO TABS: 6.2500 mg | ORAL_TABLET | Freq: Two times a day (BID) | ORAL | Status: DC

## 2020-03-28 MED ORDER — ADULT MULTIVITAMIN W/MINERALS CH: 1.0000 | ORAL_TABLET | Freq: Every day | ORAL | Status: DC

## 2020-03-28 MED ORDER — LEVALBUTEROL HCL 0.63 MG/3ML IN NEBU: 0.6300 mg | INHALATION_SOLUTION | Freq: Four times a day (QID) | RESPIRATORY_TRACT | Status: DC | PRN

## 2020-03-28 MED ORDER — ACETAMINOPHEN 325 MG PO TABS: 650.0000 mg | ORAL_TABLET | Freq: Four times a day (QID) | ORAL | Status: DC | PRN

## 2020-03-28 NOTE — Progress Notes (Signed)
[facility] here to transport pt to SNF, pt cellphone and belongigns sent with pt via [facility]. No complaints at this time.

## 2020-03-28 NOTE — Progress Notes (Signed)
PT Progress Note for Charges    03/28/20 1300  PT Visit Information  Last PT Received On 03/28/20  PT General Charges  $$ ACUTE PT VISIT 1 Visit  PT Treatments  $Therapeutic Activity 23-37 mins  Nolyn Eilert A, PT, DPT  Acute Rehabilitation Services Pager [phone] Office [phone]   

## 2020-03-28 NOTE — Progress Notes (Signed)
Report called to [phone] pt now going to room 116. Waiting on [facility] to transport

## 2020-03-28 NOTE — TOC Transition Note (Signed)
Transition of Care [facility]) - CM/SW Discharge Note   Patient Details  Name: SABRIE MENZIES MRN: [id number] Date of Birth: [birth year]  Transition of Care [facility]) CM/SW Contact:  Alberteen Sam, LCSW Phone Number: 03/28/2020, 1:24 PM   Clinical Narrative:     Patient will DC to: [facility] Anticipated DC date: 03/28/20 Family notified: [name] Transport by: Corey Harold  Per MD patient ready for DC to [place] . RN, patient, patient's family, and facility notified of DC. Discharge Summary sent to facility. RN given number for report  [phone] Room 117. DC packet on chart. Ambulance transport requested for patient.  CSW signing off.  [place], [place]   Final next level of care: Skilled Nursing Facility Barriers to Discharge: No Barriers Identified   Patient Goals and CMS Choice Patient states their goals for this hospitalization and ongoing recovery are:: snf CMS Medicare.gov Compare Post Acute Care list provided to:: Patient Represenative (must comment)(daughter [name]) Choice offered to / list presented to : Adult Children  Discharge Placement              Patient chooses bed at: ([facility]) Patient to be transferred to facility by: [place] Name of family member notified: [name] Patient and family notified of of transfer: 03/28/20  Discharge Plan and Services   Discharge Planning Services: CM Consult Post Acute Care Choice: [place]            DME Agency: NA       HH Arranged: NA [facility] Agency: [place] Date [facility] Agency Contacted: 03/21/20 Time [place]: [id number] Representative spoke with at [place]: Tommi Rumps  Social Determinants of Health ([place]) Interventions     Readmission Risk Interventions No flowsheet data found.

## 2020-03-28 NOTE — Consult Note (Signed)
   [facility] Inpatient Consult   03/28/2020  Anita [name] 1961-02-20 [id number]   Medicare patient:  [employer].   PCP: Alma Friendly  Patient went to [place], RN BSN [facility] Liaison  [phone] business mobile phone Toll free office [phone]  Fax number: [phone] [email or web address]

## 2020-03-28 NOTE — Progress Notes (Signed)
Physical Therapy Treatment Patient Details Name: Anita [name] MRN: [id number] DOB: July 15, 1961 Today's Date: 03/28/2020    History of Present Illness Pt is a 59 y/o female admitted secondary to acute hypoxic respiratory failure and SIRS. PMH includes multiple sclerosis with optic neuritis, right ear hearing loss, GERD, arthritis.     PT Comments    Pt is progressing with functional mobility and activity tolerance. She was min guard for bed mobility, using bedrails and inc time and effort to scoot to EOB. She required minA for initial power up into standing from EOB and from [facility]. She was able to shuffle her feet to pivot to the chair, BSC, then back to the chair with minA mainly for RW management. No overt LOB noted throughout session. Pt is easily frustrated, she removed her pulseox stating it made things more difficult and bothered her, she requires simple and concise cues for mobility management to dec her frustration. VSS throughout session on RA. Recommend d/c to SNF once medically appropriate to address deficits and maximize function, safety, and independence. Pt would continue to benefit from skilled physical therapy services at this time while admitted and after d/c to address the below listed limitations in order to improve overall safety and independence with functional mobility.   Follow Up Recommendations  SNF     Equipment Recommendations  Other (comment)(defer to next veue of care)    Recommendations for Other Services       Precautions / Restrictions Precautions Precautions: Fall    Mobility  Bed Mobility Overal bed mobility: Needs Assistance Bed Mobility: Supine to Sit     Supine to sit: Min guard     General bed mobility comments: min gaurd for safety and inc time and effort to scoot to EOB. Pt became frustrated with pulseox line and took it off (spot checked with personal poluse ox monitor)- replaced pulseox post-session  Transfers Overall transfer  level: Needs assistance Equipment used: Rolling walker (2 wheeled) Transfers: Sit to/from [facility] Sit to Stand: Min assist Stand pivot transfers: Min assist       General transfer comment: minA for power up from EOB. Pt demo unsafe handeling with RW even with VC for proper management. Able to shuffle feet to pivot to chair on L side, then again to [facility] where she voided, then pivoted abck to R to chair.   Ambulation/Gait Ambulation/Gait assistance: Min assist Gait Distance (Feet): 3 Feet Assistive device: Rolling walker (2 wheeled) Gait Pattern/deviations: Step-to pattern;Shuffle;Decreased stride length Gait velocity: markedly dec   General Gait Details: shuffled feet to pivot to chair and BSC, minA for RW management   Stairs             Wheelchair Mobility    Modified Rankin (Stroke Patients Only)       Balance Overall balance assessment: Needs assistance Sitting-balance support: Bilateral upper extremity supported;Feet supported Sitting balance-Leahy Scale: Fair     Standing balance support: Bilateral upper extremity supported;During functional activity Standing balance-Leahy Scale: Poor Standing balance comment: Reliant on UE and external support                             Cognition Arousal/Alertness: Awake/alert Behavior During Therapy: WFL for tasks assessed/performed Overall Cognitive Status: Within Functional Limits for tasks assessed  Exercises General Exercises - Lower Extremity Ankle Circles/Pumps: AROM;Strengthening;Both;5 reps;Seated    General Comments General comments (skin integrity, edema, etc.): VSS throughout on RA. Pt states and is apparent that L LE is more difficult to move vs R LE      Pertinent Vitals/Pain Pain Assessment: No/denies pain    Home Living                      Prior Function            PT Goals (current goals can now be  found in the care plan section) Acute Rehab PT Goals PT Goal Formulation: With patient Time For Goal Achievement: 04/09/20 Potential to Achieve Goals: Good Progress towards PT goals: Progressing toward goals    Frequency    Min 2X/week      PT Plan Discharge plan needs to be updated;Frequency needs to be updated    Co-evaluation              AM-PAC PT "6 Clicks" Mobility   Outcome Measure  Help needed turning from your back to your side while in a flat bed without using bedrails?: A Little Help needed moving from lying on your back to sitting on the side of a flat bed without using bedrails?: A Little Help needed moving to and from a bed to a chair (including a wheelchair)?: A Lot Help needed standing up from a chair using your arms (e.g., wheelchair or bedside chair)?: A Little Help needed to walk in hospital room?: A Lot Help needed climbing 3-5 steps with a railing? : Total 6 Click Score: 14    End of Session Equipment Utilized During Treatment: Gait belt Activity Tolerance: Patient tolerated treatment well Patient left: in chair;with call bell/phone within reach;with chair alarm set Nurse Communication: Mobility status PT Visit Diagnosis: Unsteadiness on feet (R26.81);Muscle weakness (generalized) (M62.81)     Time: [id number] PT Time Calculation (min) (ACUTE ONLY): 25 min  Charges:  $Therapeutic Activity: 23-37 mins                    Shakiyla Kook, SPT Acute Rehab  [id number]  Amir Fick 03/28/2020, 1:16 PM

## 2020-03-28 NOTE — Plan of Care (Signed)
Problem: Education: Goal: Knowledge of General Education information will improve Description: Including pain rating scale, medication(s)/side effects and non-pharmacologic comfort measures 03/28/2020 1240 by Don Perking, RN Outcome: Adequate for Discharge 03/28/2020 0743 by Don Perking, RN Outcome: Progressing   Problem: Health Behavior/Discharge Planning: Goal: Ability to manage health-related needs will improve 03/28/2020 1240 by Don Perking, RN Outcome: Adequate for Discharge 03/28/2020 0743 by Don Perking, RN Outcome: Progressing   Problem: Clinical Measurements: Goal: Ability to maintain clinical measurements within normal limits will improve 03/28/2020 1240 by Don Perking, RN Outcome: Adequate for Discharge 03/28/2020 0743 by Don Perking, RN Outcome: Progressing Goal: Will remain free from infection 03/28/2020 1240 by Don Perking, RN Outcome: Adequate for Discharge 03/28/2020 0743 by Don Perking, RN Outcome: Progressing Goal: Diagnostic test results will improve 03/28/2020 1240 by Don Perking, RN Outcome: Adequate for Discharge 03/28/2020 0743 by Don Perking, RN Outcome: Progressing Goal: Respiratory complications will improve 03/28/2020 1240 by Don Perking, RN Outcome: Adequate for Discharge 03/28/2020 0743 by Don Perking, RN Outcome: Progressing Goal: Cardiovascular complication will be avoided 03/28/2020 1240 by Don Perking, RN Outcome: Adequate for Discharge 03/28/2020 0743 by Don Perking, RN Outcome: Progressing   Problem: Activity: Goal: Risk for activity intolerance will decrease 03/28/2020 1240 by Don Perking, RN Outcome: Adequate for Discharge 03/28/2020 [phone] by Don Perking, RN Outcome: Progressing   Problem: Nutrition: Goal: Adequate nutrition will be maintained 03/28/2020 1240 by Don Perking, RN Outcome: Adequate for Discharge 03/28/2020 0743 by  Don Perking, RN Outcome: Progressing   Problem: Coping: Goal: Level of anxiety will decrease 03/28/2020 1240 by Don Perking, RN Outcome: Adequate for Discharge 03/28/2020 0743 by Don Perking, RN Outcome: Progressing   Problem: Elimination: Goal: Will not experience complications related to bowel motility 03/28/2020 1240 by Don Perking, RN Outcome: Adequate for Discharge 03/28/2020 0743 by Don Perking, RN Outcome: Progressing Goal: Will not experience complications related to urinary retention 03/28/2020 1240 by Don Perking, RN Outcome: Adequate for Discharge 03/28/2020 0743 by Don Perking, RN Outcome: Progressing   Problem: Pain Managment: Goal: General experience of comfort will improve 03/28/2020 1240 by Don Perking, RN Outcome: Adequate for Discharge 03/28/2020 0743 by Don Perking, RN Outcome: Progressing   Problem: Safety: Goal: Ability to remain free from injury will improve 03/28/2020 1240 by Don Perking, RN Outcome: Adequate for Discharge 03/28/2020 0743 by Don Perking, RN Outcome: Progressing   Problem: Skin Integrity: Goal: Risk for impaired skin integrity will decrease 03/28/2020 1240 by Don Perking, RN Outcome: Adequate for Discharge 03/28/2020 0743 by Don Perking, RN Outcome: Progressing   Problem: Education: Goal: Understanding of CV disease, CV risk reduction, and recovery process will improve 03/28/2020 1240 by Don Perking, RN Outcome: Adequate for Discharge 03/28/2020 0743 by Don Perking, RN Outcome: Progressing Goal: Individualized Educational Video(s) 03/28/2020 1240 by Don Perking, RN Outcome: Adequate for Discharge 03/28/2020 0743 by Don Perking, RN Outcome: Progressing   Problem: Activity: Goal: Ability to return to baseline activity level will improve 03/28/2020 1240 by Don Perking, RN Outcome: Adequate for Discharge 03/28/2020  [phone] by Don Perking, RN Outcome: Progressing   Problem: Cardiovascular: Goal: Ability to achieve and maintain adequate cardiovascular perfusion will improve 03/28/2020 1240 by Don Perking, RN Outcome: Adequate for Discharge 03/28/2020 0743 by Don Perking, RN Outcome: Progressing Goal: Vascular access  site(s) Level 0-1 will be maintained 03/28/2020 1240 by Don Perking, RN Outcome: Adequate for Discharge 03/28/2020 [phone] by Don Perking, RN Outcome: Progressing   Problem: Health Behavior/Discharge Planning: Goal: Ability to safely manage health-related needs after discharge will improve 03/28/2020 1240 by Don Perking, RN Outcome: Adequate for Discharge 03/28/2020 0743 by Don Perking, RN Outcome: Progressing  Pt going to SNF today via [facility].

## 2020-03-28 NOTE — Plan of Care (Signed)

## 2020-03-28 NOTE — Discharge Summary (Signed)
Physician Discharge Summary  Anita [name] [id number] DOB: 1961-06-28 DOA: 03/20/2020  PCP: [name], NP  Admit date: 03/20/2020 Discharge date: 03/28/2020  Time spent: 35 minutes  Recommendations for Outpatient Follow-up:  1. Repeat CBC to assure resolution of WBCs and assess hemoglobin trend. 2. Repeat basic metabolic panel to follow electrolytes and renal function 3. Repeat chest x-ray in 6 weeks to assure complete resolution of infiltrates 4. Continue assisting patient with a smoking cessation and make sure she has follow-up with cardiology service as instructed.   Discharge Diagnoses:  Principal Problem:   Acute respiratory distress Active Problems:   TOBACCO ABUSE   Multiple sclerosis (HCC)   Acute respiratory failure with hypoxia (HCC)   Prolonged QT interval   NSTEMI (non-ST elevated myocardial infarction) (HCC)   SIRS (systemic inflammatory response syndrome) (HCC)   Normocytic anemia   Pneumonia   AKI (acute kidney injury) ([place])   Respiratory distress   Atypical pneumonia   Tobacco abuse   Elevated troponin   Acute systolic CHF (congestive heart failure) ([place])   Pulmonary hypertension ([place])   Retroperitoneal mass   Insomnia   Discharge Condition: Stable and improved.  Patient discharged to skilled nursing facility for further care and rehabilitation.  Follow-up with PCP and cardiology as instructed.  Code status: full code  Diet recommendation: heart healthy/low sodium diet  Filed Weights   03/26/20 0500 03/27/20 0538 03/28/20 0522  Weight: 70 kg 70.6 kg 70.5 kg    History of present illness:  Anita Brautigan Willoughbyis a 59 y.o.WF PMHx Multiple Sclerosis with Optic neuritis, RIGHT ear hearing loss, migraine headache, obese, GERD, arthritis, tobacco abuse, and allergies   Presents with complaints ofshortness of breath. Reports having intermittent episodes of shortness of breath lasting approximately 2 minutes before self resolving  over the last 3-4days. During episode she would have palpitations, diaphoresis, and feel anxious. Symptoms would gradually resolve on their own. Over this weekend she also has reported having diarrhea 3-4 episodes per day, nausea, and mostly nonproductive cough. Diarrhea was noted to be yellowish-orange in she did not notice any blood. She had tried taking Mucinex, but thought that it may have been causing her to have diarrhea. Denied having any recent sick contacts or knowledge of chest pain, abdominal pain, leg swelling, weight gain, dysuria, or vomiting,. However, last night while trying to go to bed developed severe shortness of breath which did not go away. Laying seem to worsen symptoms.   Reports smoking half a pack cigarettes per day on average. She has been off of treatment for MS for at least 10 years now because she felt that they did not help. Ambulates with difficulty using a walker. She has been muscle spasms and utilizes Tylenol to treat symptoms. Patient notes family history significant for mother who had a heart attack at age 63.  ED Course:Upon admission into the emergency department patient was seen to be afebrile with pulse 113-153, respirations 23-30, blood pressures maintained, and O2 saturations 93-98% on room air. Labs significant for WBC 8.1, hemoglobin 10.3, CO2 16, BUN 21, creatinine 1.3, glucose 195,anion gap 18, troponin 2581->2513, and RI:[id number].Chest x-ray showed diffuse interstitial process concerning for atypical pneumonia versus edema. Patient had been given heparin bolus of 4000 units, Rocephin azithromycin, and 1 L normal saline IV fluids. Cardiology has been consulted due to the elevated troponin. [facility] called to admit.   Hospital Course:  Acute respiratory failure with hypoxia Response to the use of steroids, nebulizer management, bronchodilators and  mucolytic's -Patient instructed to continue wound exercises with for about an incentive  spirometer -Outpatient follow-up with pulmonology service to further assess lung function and adjust maintenance therapy for COPD. -Continue Xopenex as needed and Pulmicort twice a day. -Patient completed antibiotics and steroid therapy while inpatient.  Atypical pneumonia -Patient CT angiogram 4/13 consistent with viral/atypical pneumonia see results below -See acute respiratory distress -All viral panels negative. -4/19 PCXR; slight improvement in air movement -Recommending repeat chest x-ray in 6 weeks to assure complete resolution of infiltrates.     Tobacco abuse -Patient was counseled on need to absolutely discontinue smoking forever.  Has been smoking 1/2 PPD since 59 years old -Nicotine patch  Elevated troponin/Acute systolic CHF/NSTEMI/severe three-vessel disease/pulmonary hypertension -EF 40 to 45% see echocardiogram results below -Recommending to follow low-sodium diet, daily weight and appropriate hydration. -Patient has been discharged on carvedilol and losartan. -Continue outpatient follow-up with cardiology service.  Leukocytosis -In the setting of his steroid usage -No fever, has completed antibiotic therapy -Repeat CBC at follow-up visit to reassess WBCs trend.   Acute kidney injury vs CKD -Previous creatinine= 1.08 on 01/03/2011 -Most likely patient's renal function has been worsening over the years and she has not known this. Last Labs          Recent Labs  Lab 03/23/20 0218 03/24/20 0346 03/25/20 0226 03/26/20 0235 03/27/20 0849  CREATININE 1.24* 1.25* 1.43* 1.53* 1.58*    -Slight increase in creatinine most likely secondary to starting ARB, decrease conditions blood pressure and further control of volume. -Creatinine stable at discharge -Will require further follow-up as an outpatient. -No complaints of urinary retention, infection in her urine or obstructive uropathy.  Prolonged QT interval: Acute. -Follow and replete electrolytes as  needed. 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 -Was seen by Dr. Eston Esters oncology in 2007; no longer practices -4/18 discussed case with NP Katie CCS feels that patient probably not a surgical candidate, best course of action would be to reestablish care with Oncologist first if patient desires and then come up with plan..   -4/18 after explaining options patient not interested in pursuing further diagnosis/therapeutic options.  Metabolic acidosis with elevated anion gap -Most likely 18-year-old by respiratory distress -Resolved and stable renal discharge.  Multiple sclerosis:  -Patient reports that she has been off of any kind of treatment over the last 10 years as she felt they were having no effect. Patient reports that she has been using Tylenol and ibuprofen. -Robaxin as needed for muscle spasms intermittently use inside the hospital. -Denies muscle cramps or pain at time of discharge.  Normocytic anemia: Acute.  -Hemoglobin 10.3 g/dL on admission -Most likely a component of anemia of chronic disease from renal failure. -No signs of overt bleeding. -Recommended transfusion for hemoglobin less than 7.  Diarrhea  -C. difficile negative -GI panel negative -No diarrhea at time of discharge -Recommended increase fiber in diet and maintaining adequate hydration.  Insomnia -Continue as needed sleeping aid medication -Long discussion about sleep hygiene sustained with patient.  Hypokalemia -Stable and within normal limits at discharge -Potassium goal more than 4.  Repeat basic metabolic panel follow-up visit to reassess electrolytes and renal function.  HLD -Rosuvastatin 20 mg daily -Heart healthy diet has been encouraged -Follow-up LFTs and lipid panel as an outpatient.  Disposition -Patient will be transferred to skilled nursing facility  for further care and rehabilitation.  Outpatient  follow-up with PCP and cardiology recommended.    Procedures: 4/13 Echocardiogram;Left Ventricle: LVEF=40 -45%.-Global hypokinesis.  -Left ventricular diastolic function could not be evaluated due to mitral stenosis.  Right Ventricle:  moderately elevated pulmonary artery systolic pressure.  The tricuspid regurgitant velocity is 3.30 m/s, and with an assumed right atrial pressure of 15 mmHg, the estimated right ventricular systolic pressure is 0000000 mmHg.  Mitral Valve: Rheumatic. There is moderate thickening of the mitral valve leaflet(s). Moderate mitral subvalvular thickening/fibrosis. Moderate to severe mitral valve regurgitation, with  posteriorly-directed jet. Mild mitral valve stenosis. The mean mitral valve gradient is 7.3 mmHg with average heart  rate of 105 bpm.  Venous: The inferior vena cava is dilated in size with less than 50% respiratory variability, suggesting right atrial pressure of 15 mmHg.  4/14 CTA chest PE protocol;-ground-glass opacities with history favoring atypical/viral pneumonia. -Small pleural effusions and interlobular septal thickening not typically associated with viral pneumonia, findings could reflect atypical edema or failure superimposed on infection. -Left retroperitoneal mass, ganglioneuroma at 2007 pathology.There is residual or recurrent mass adjacent to the retroperitoneal clips, 5.6 cm. Was the patient is prior resection subtotal? 4/16 cardiac catheterization;- LV end diastolic pressure is moderately elevated. -Ost RCA to Dist RCA lesion is 100% stenosed. - PDA and PL system fills via faint collaterals from the AV groove LCx and LAD septals. -Mid LAD-1 lesion is 60% stenosed. Mid LAD-2 lesion is 50% stenosed. Dist LAD lesion is 55% stenosed with 70% stenosed side branch in 2nd Diag. -1st Diag lesion is 70% stenosed. -Ost Cx to Prox Cx lesion is 45% stenosed. Prox-MID Cx lesion is 65%  stenosed.   MODERATE-SEVERE THREE-VESSEL CAD: ? 100% proximal RCA occlusion with left-to-right collaterals faintly filling PDA and PL system (AV groove LCx-PL and LAD septal-PDA) ? Diffuse moderate mid LAD disease with 60% to 50% stenosis. ? Tandem 50% and 65% proximal and mid LCx with the 65% lesion being the most significant lesion.  Moderately elevated LVEDP of 18-20 mmHg  Given the extent of disease in the LAD and LCx, neither lesion is very amenable to PCI, and RCA is chronically occluded. Given her comorbidities, I do not think she would be a good CABG candidate especially in light of the fact that she would likely require valve surgery as well. She would not recover well.  4/17 PCXR;Stable bilateral interstitial densities are noted concerning for edema or possibly atypical infection 4/19 PCXR;-persistent interstitial densities throughout both lungs -Slightly improved aeration in the perihilar regions.  Consultations:  Cardiology service  Physical rehabilitation   Discharge Exam: Vitals:   03/28/20 0943 03/28/20 1108  BP:  134/79  Pulse: 84 80  Resp: (!) 21 19  Temp:  97.8 F (36.6 C)  SpO2: 97% 93%    General: Afebrile, no chest pain, no nausea, no vomiting.  Good oxygen saturation and no requiring supplementation.  No orthopnea. Cardiovascular: Soft systolic ejection murmur, no JVD, no rubs, no gallops. Respiratory: Positive scattered rhonchi; no wheezing, no using accessory muscles. Abdomen: Soft, nontender, nondistended, positive bowel sounds Extremities: No cyanosis, no clubbing.  Discharge Instructions   Discharge Instructions    Diet - low sodium heart healthy   Complete by: As directed    Discharge instructions   Complete by: As directed    Take medications as prescribed Maintain adequate hydration Follow low-sodium diet Follow-up with PCP in 2 weeks after discharge from the skilled nursing facility Repeat basic metabolic panel in 1 week to  evaluate electrolyte and renal function  Follow daily weights and heart healthy diet.   Increase activity slowly   Complete by: As directed      Allergies as of 03/28/2020      Reactions   Acthar Hp [corticotropin] Other (See Comments)   Throat swelling and coughing up blood   Codeine    Prednisone       Medication List    STOP taking these medications   ibuprofen 200 MG tablet Commonly known as: ADVIL   ranitidine 150 MG tablet Commonly known as: Zantac   topiramate 25 MG tablet Commonly known as: Topamax     TAKE these medications   acetaminophen 325 MG tablet Commonly known as: TYLENOL Take 2 tablets (650 mg total) by mouth every 6 (six) hours as needed for mild pain (or Fever >/= 101).   aspirin 81 MG EC tablet Take 1 tablet (81 mg total) by mouth daily. Start taking on: March 29, 2020   budesonide 0.25 MG/2ML nebulizer solution Commonly known as: PULMICORT Take 4 mLs (0.5 mg total) by nebulization 2 (two) times daily.   calcium carbonate 750 MG chewable tablet Commonly known as: TUMS EX Chew 1 tablet by mouth daily as needed for heartburn.   carvedilol 6.25 MG tablet Commonly known as: COREG Take 1 tablet (6.25 mg total) by mouth 2 (two) times daily with a meal.   cetirizine 10 MG tablet Commonly known as: ZYRTEC Take 10 mg by mouth daily.   feeding supplement (ENSURE ENLIVE) Liqd Take 237 mLs by mouth 2 (two) times daily between meals.   guaiFENesin 600 MG 12 hr tablet Commonly known as: MUCINEX Take 600 mg by mouth 2 (two) times daily as needed for cough.   levalbuterol 0.63 MG/3ML nebulizer solution Commonly known as: XOPENEX Take 3 mLs (0.63 mg total) by nebulization every 6 (six) hours as needed for wheezing or shortness of breath.   losartan 25 MG tablet Commonly known as: Cozaar Take 0.5 tablets (12.5 mg total) by mouth daily.   multivitamin with minerals Tabs tablet Take 1 tablet by mouth daily. Start taking on: March 29, 2020    nicotine 21 mg/24hr patch Commonly known as: NICODERM CQ - dosed in mg/24 hours Place 1 patch (21 mg total) onto the skin daily. Start taking on: March 29, 2020   rosuvastatin 20 MG tablet Commonly known as: CRESTOR Take 1 tablet (20 mg total) by mouth daily. Start taking on: March 29, 2020   SLEEP AID PO Take 1 tablet by mouth at bedtime as needed (sleep).   SUMAtriptan 25 MG tablet Commonly known as: IMITREX Take 1 tablet by mouth at migraine onset, may repeat in 2 hours if headache persists or recurs. Do not exceed 2 tablets in 24 hours.      Allergies  Allergen Reactions  . Acthar Hp [Corticotropin] Other (See Comments)    Throat swelling and coughing up blood  . Codeine   . Prednisone    Follow-up Information    [facility] Follow [facility].   Specialty: Home Health Services Why: [facility] Contact information: [place] [address] [phone]        [name], NP Follow up on 04/03/2020.   Specialty: Internal Medicine Why: 10:20 for hospital follow up. Contact information: [place] [address] [phone]           The results of significant diagnostics from this hospitalization (including imaging, microbiology, ancillary and laboratory) are listed  below for reference.    Significant Diagnostic Studies: DG Chest 2 View  Result Date: 03/20/2020 CLINICAL DATA:  Shortness of breath EXAM: CHEST - 2 VIEW COMPARISON:  01/03/2011 FINDINGS: No consolidation or effusion. Emphysematous disease. Mild diffuse increased interstitial opacity. Heart size within normal limits. No pneumothorax. Aortic atherosclerosis. IMPRESSION: Diffuse interstitial process. Consider atypical infection versus interstitial edema. There is probable underlying emphysema. Electronically Signed   By: Donavan Foil M.D.   On: 03/20/2020 02:39   CT Angio Chest PE W and/or Wo Contrast  Result Date: 03/20/2020 CLINICAL DATA:   Fever and chills with shortness of breath that began last night. EXAM: CT ANGIOGRAPHY CHEST WITH CONTRAST TECHNIQUE: Multidetector CT imaging of the chest was performed using the standard protocol during bolus administration of intravenous contrast. Multiplanar CT image reconstructions and MIPs were obtained to evaluate the vascular anatomy. CONTRAST:  61mL OMNIPAQUE IOHEXOL 350 MG/ML SOLN COMPARISON:  03/19/2006 chest CT FINDINGS: Cardiovascular: Normal heart size. No pericardial effusion. Aortic atherosclerosis. No pulmonary artery filling defect. Mediastinum/Nodes: Mild prominence of hilar lymph nodes, likely reactive/congestive. Lungs/Pleura: Patchy ground-glass opacity in the upper more than lower lungs. Small right and trace left pleural effusion with interlobular septal thickening at the bases. Upper Abdomen: Left-sided retroperitoneal mass measuring 5.6 cm. Along the inferior aspect of the mass or surgical clips and the mass is smaller than on 2007 abdominal CT. Musculoskeletal: No acute finding Review of the MIP images confirms the above findings. IMPRESSION: 1. Ground-glass opacities with history favoring atypical/viral pneumonia. 2. Small pleural effusions and interlobular septal thickening not typically associated with viral pneumonia, findings could reflect atypical edema or failure superimposed on infection. 3. 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 There is residual or recurrent mass adjacent to the retroperitoneal clips, 5.6 cm. Was the patient is prior resection subtotal? Electronically Signed   By: Monte Fantasia M.D.   On: 03/20/2020 05:27   CARDIAC CATHETERIZATION  Result Date: 03/23/2020  Hemodynamics: LV end diastolic pressure is moderately elevated.  ----Coronary Angiography-----  Ost RCA to Dist RCA lesion is 100% stenosed.-The PDA and PL system fills via faint collaterals from the AV groove LCx and LAD septals.  Mid LAD-1 lesion is 60% stenosed. Mid LAD-2  lesion is 50% stenosed. Dist LAD lesion is 55% stenosed with 70% stenosed side branch in 2nd Diag.  1st Diag lesion is 70% stenosed.  Ost Cx to Prox Cx lesion is 45% stenosed. Prox-MID Cx lesion is 65% stenosed.   MODERATE-SEVERE THREE-VESSEL CAD:  100% proximal RCA occlusion with left-to-right collaterals faintly filling PDA and PL system (AV groove LCx-PL and LAD septal-PDA)  Diffuse moderate mid LAD disease with 60% to 50% stenosis.  Tandem 50% and 65% proximal and mid LCx with the 65% lesion being the most significant lesion.  Moderately elevated LVEDP of 18-20 mmHg Given the extent of disease in the LAD and LCx, neither lesion is very amenable to PCI, and RCA is chronically occluded.  Given her comorbidities, I do not think she would be a good CABG candidate especially in light of the fact that she would likely require valve surgery as well.  She would not recover well. Recommendations aggressive medical management. [name], MD  DG CHEST PORT 1 VIEW  Result Date: 03/26/2020 CLINICAL DATA:  Pneumonia. EXAM: PORTABLE CHEST 1 VIEW COMPARISON:  03/24/2020 FINDINGS: Diffuse interstitial lung densities. Aeration in the perihilar region has slightly improved. Heart size is upper limits of normal but stable. Trachea remains midline. Negative for pneumothorax. Bone  structures are unremarkable. IMPRESSION: Persistent interstitial densities throughout both lungs with slightly improved aeration in the perihilar regions. Electronically Signed   By: Markus Daft M.D.   On: 03/26/2020 08:41   DG CHEST PORT 1 VIEW  Result Date: 03/24/2020 CLINICAL DATA:  Community-acquired pneumonia. EXAM: PORTABLE CHEST 1 VIEW COMPARISON:  March 20, 2020. FINDINGS: The heart size and mediastinal contours are within normal limits. No pneumothorax or pleural effusion is noted. Stable bilateral interstitial densities are noted concerning for edema or possibly atypical infection. The visualized skeletal structures are  unremarkable. IMPRESSION: Stable bilateral interstitial densities are noted concerning for edema or possibly atypical infection. Electronically Signed   By: Marijo Conception M.D.   On: 03/24/2020 08:52   ECHOCARDIOGRAM COMPLETE  Result Date: 03/20/2020    ECHOCARDIOGRAM REPORT   Patient Name:   Anita [name] Date of Exam: 03/20/2020 Medical Rec #:  [id number]             Height:       63.0 in Accession #:    [id number]            Weight:       155.7 lb Date of Birth:  1961/09/07             BSA:          1.738 m Patient Age:    24 years              BP:           124/91 mmHg Patient Gender: F                     HR:           111 bpm. Exam Location:  Inpatient Procedure: 2D Echo Indications:    dyspnea 428.31  History:        Patient has no prior history of Echocardiogram examinations.                 Risk Factors:Current Smoker.  Sonographer:    Jannett Celestine RDCS (AE) Referring Phys: [phone] RONDELL A SMITH IMPRESSIONS  1. Left ventricular ejection fraction, by estimation, is 40 to 45%. The left ventricle has mildly decreased function. The left ventricle demonstrates global hypokinesis. The left ventricular internal cavity size was mildly dilated. Left ventricular diastolic function could not be evaluated.  2. Right ventricular systolic function is normal. The right ventricular size is normal. There is moderately elevated pulmonary artery systolic pressure.  3. Left atrial size was mildly dilated.  4. Moderate mitral subvalvular thickening/fibrosis.  5. The mitral valve is rheumatic. Moderate to severe mitral valve regurgitation. Mild mitral stenosis. The mean mitral valve gradient is 7.3 mmHg with average heart rate of 105 bpm.  6. The aortic valve is normal in structure. Aortic valve regurgitation is not visualized.  7. The inferior vena cava is dilated in size with <50% respiratory variability, suggesting right atrial pressure of 15 mmHg. Conclusion(s)/Recommendation(s): Consider TEE for better mitral  regurgitation evaluation. FINDINGS  Left Ventricle: Left ventricular ejection fraction, by estimation, is 40 to 45%. The left ventricle has mildly decreased function. The left ventricle demonstrates global hypokinesis. The left ventricular internal cavity size was mildly dilated. There is  no left ventricular hypertrophy. Left ventricular diastolic function could not be evaluated due to mitral stenosis. Left ventricular diastolic function could not be evaluated. Right Ventricle: The right ventricular size is normal. No increase in right ventricular wall thickness. Right ventricular  systolic function is normal. There is moderately elevated pulmonary artery systolic pressure. The tricuspid regurgitant velocity is 3.30 m/s, and with an assumed right atrial pressure of 15 mmHg, the estimated right ventricular systolic pressure is 0000000 mmHg. Left Atrium: Left atrial size was mildly dilated. Right Atrium: Right atrial size was normal in size. Pericardium: There is no evidence of pericardial effusion. Mitral Valve: The mitral valve is rheumatic. There is moderate thickening of the mitral valve leaflet(s). Moderate mitral subvalvular thickening/fibrosis. Moderate to severe mitral valve regurgitation, with posteriorly-directed jet. Mild mitral valve stenosis. The mean mitral valve gradient is 7.3 mmHg with average heart rate of 105 bpm. Tricuspid Valve: The tricuspid valve is normal in structure. Tricuspid valve regurgitation is mild. Aortic Valve: The aortic valve is normal in structure. Aortic valve regurgitation is not visualized. Pulmonic Valve: The pulmonic valve was not well visualized. Pulmonic valve regurgitation is not visualized. Aorta: The aortic root and ascending aorta are structurally normal, with no evidence of dilitation. Venous: The inferior vena cava is dilated in size with less than 50% respiratory variability, suggesting right atrial pressure of 15 mmHg. IAS/Shunts: No atrial level shunt detected by color  flow Doppler.  LEFT VENTRICLE PLAX 2D LVIDd:         5.70 cm LVIDs:         4.60 cm LV PW:         0.90 cm LV IVS:        0.80 cm LVOT diam:     2.30 cm LV SV:         62 LV SV Index:   36 LVOT Area:     4.15 cm  RIGHT VENTRICLE TAPSE (M-mode): 1.3 cm LEFT ATRIUM             Index LA diam:        3.30 cm 1.90 cm/m LA Vol (A2C):   48.2 ml 27.73 ml/m LA Vol (A4C):   52.5 ml 30.20 ml/m LA Biplane Vol: 52.4 ml 30.14 ml/m  AORTIC VALVE LVOT Vmax:   102.31 cm/s LVOT Vmean:  65.204 cm/s LVOT VTI:    0.150 m  AORTA Ao Root diam: 3.30 cm MITRAL VALVE                 TRICUSPID VALVE MV Area (PHT): 2.42 cm      TR Peak grad:   43.6 mmHg MV Mean grad:  7.3 mmHg      TR Vmax:        330.00 cm/s MV Decel Time: 313 msec MR Peak grad:    76.7 mmHg   SHUNTS MR Mean grad:    49.0 mmHg   Systemic VTI:  0.15 m MR Vmax:         438.00 cm/s Systemic Diam: 2.30 cm MR Vmean:        328.0 cm/s MR PISA:         1.90 cm MR PISA Eff ROA: 17 mm MR PISA Radius:  0.55 cm MV E velocity: 145.00 cm/s MV A velocity: 120.00 cm/s MV E/A ratio:  1.21 Mihai Croitoru MD Electronically signed by Sanda Klein MD Signature Date/Time: 03/20/2020/10:00:41 AM    Final     Microbiology: Recent Results (from the past 240 hour(s))  Respiratory Panel by RT PCR (Flu A&B, Covid) -     Status: None   Collection Time: 03/20/20  5:54 AM  Result Value Ref Range Status   SARS Coronavirus 2 by RT PCR NEGATIVE NEGATIVE Final  Comment: (NOTE) SARS-CoV-2 target nucleic acids are NOT DETECTED. The SARS-CoV-2 RNA is generally detectable in upper respiratoy specimens during the acute phase of infection. The lowest concentration of SARS-CoV-2 viral copies this assay can detect is 131 copies/mL. A negative result does not preclude SARS-Cov-2 infection and should not be used as the sole basis for treatment or other patient management decisions. A negative result may occur with  improper specimen collection/handling, submission of specimen other than  nasopharyngeal swab, presence of viral mutation(s) within the areas targeted by this assay, and inadequate number of viral copies (<131 copies/mL). A negative result must be combined with clinical observations, patient history, and epidemiological information. The expected result is Negative. Fact Sheet for Patients:  PinkCheek.be Fact Sheet for Healthcare Providers:  GravelBags.it This test is not yet ap proved or cleared by the [place] FDA and  has been authorized for detection and/or diagnosis of SARS-CoV-2 by FDA under an Emergency Use Authorization (EUA). This EUA will remain  in effect (meaning this test can be used) for the duration of the COVID-19 declaration under Section 564(b)(1) of the Act, 21 U.S.C. section 360bbb-3(b)(1), unless the authorization is terminated or revoked sooner.    Influenza A by PCR NEGATIVE NEGATIVE Final   Influenza B by PCR NEGATIVE NEGATIVE Final    Comment: (NOTE) The Xpert Xpress SARS-CoV-2/FLU/RSV assay is intended as an aid in  the diagnosis of influenza from Nasopharyngeal swab specimens and  should not be used as a sole basis for treatment. Nasal washings and  aspirates are unacceptable for Xpert Xpress SARS-CoV-2/FLU/RSV  testing. Fact Sheet for Patients: PinkCheek.be Fact Sheet for Healthcare Providers: GravelBags.it This test is not yet approved or cleared by the [place] FDA and  has been authorized for detection and/or diagnosis of SARS-CoV-2 by  FDA under an Emergency Use Authorization (EUA). This EUA will remain  in effect (meaning this test can be used) for the duration of the  Covid-19 declaration under Section 564(b)(1) of the Act, 21  U.S.C. section 360bbb-3(b)(1), unless the authorization is  terminated or revoked. Performed at [facility], [place] [address], [place], [place] [address]    Respiratory Panel by PCR     Status: None   Collection Time: 03/20/20  5:54 AM   Specimen: Nasopharyngeal Swab; Respiratory  Result Value Ref Range Status   Adenovirus NOT DETECTED NOT DETECTED Final   Coronavirus 229E NOT DETECTED NOT DETECTED Final    Comment: (NOTE) The Coronavirus on the Respiratory Panel, DOES NOT test for the novel  Coronavirus (2019 nCoV)    Coronavirus HKU1 NOT DETECTED NOT DETECTED Final   Coronavirus NL63 NOT DETECTED NOT DETECTED Final   Coronavirus OC43 NOT DETECTED NOT DETECTED Final   Metapneumovirus NOT DETECTED NOT DETECTED Final   Rhinovirus / Enterovirus NOT DETECTED NOT DETECTED Final   Influenza A NOT DETECTED NOT DETECTED Final   Influenza B NOT DETECTED NOT DETECTED Final   Parainfluenza Virus 1 NOT DETECTED NOT DETECTED Final   Parainfluenza Virus 2 NOT DETECTED NOT DETECTED Final   Parainfluenza Virus 3 NOT DETECTED NOT DETECTED Final   Parainfluenza Virus 4 NOT DETECTED NOT DETECTED Final   Respiratory Syncytial Virus NOT DETECTED NOT DETECTED Final   Bordetella pertussis NOT DETECTED NOT DETECTED Final   Chlamydophila pneumoniae NOT DETECTED NOT DETECTED Final   Mycoplasma pneumoniae NOT DETECTED NOT DETECTED Final    Comment: Performed at [facility], [place]. [address] [place], [place] [address]  Blood  Culture (routine x 2)     Status: None   Collection Time: 03/20/20  7:31 AM   Specimen: BLOOD RIGHT FOREARM  Result Value Ref Range Status   Specimen Description BLOOD RIGHT FOREARM  Final   Special Requests   Final    BOTTLES DRAWN AEROBIC AND ANAEROBIC Blood Culture adequate volume   Culture   Final    NO GROWTH 5 DAYS Performed at [facility], [address], [place], [place] [address]    Report Status 03/25/2020 FINAL  Final  Blood Culture (routine x 2)     Status: None   Collection Time: 03/20/20  8:06 AM   Specimen: BLOOD LEFT ARM  Result Value Ref Range Status   Specimen Description BLOOD LEFT ARM  Final    Special Requests   Final    BOTTLES DRAWN AEROBIC AND ANAEROBIC Blood Culture adequate volume   Culture   Final    NO GROWTH 5 DAYS Performed at [facility], [place] [address] [place], [place] [address]    Report Status 03/25/2020 FINAL  Final  MRSA PCR Screening     Status: None   Collection Time: 03/20/20  3:52 PM   Specimen: Nasopharyngeal  Result Value Ref Range Status   MRSA by PCR NEGATIVE NEGATIVE Final    Comment:        The GeneXpert MRSA Assay (FDA approved for NASAL specimens only), is one component of a comprehensive MRSA colonization surveillance program. It is not intended to diagnose MRSA infection nor to guide or monitor treatment for MRSA infections. Performed at [facility], [place] [address] [place], [place] [address]   Gastrointestinal Panel by PCR , Stool     Status: None   Collection Time: 03/21/20  1:50 PM   Specimen: STOOL  Result Value Ref Range Status   Campylobacter species NOT DETECTED NOT DETECTED Final   Plesimonas shigelloides NOT DETECTED NOT DETECTED Final   Salmonella species NOT DETECTED NOT DETECTED Final   Yersinia enterocolitica NOT DETECTED NOT DETECTED Final   Vibrio species NOT DETECTED NOT DETECTED Final   Vibrio cholerae NOT DETECTED NOT DETECTED Final   Enteroaggregative E coli (EAEC) NOT DETECTED NOT DETECTED Final   Enteropathogenic E coli (EPEC) NOT DETECTED NOT DETECTED Final   Enterotoxigenic E coli (ETEC) NOT DETECTED NOT DETECTED Final   Shiga like toxin producing E coli (STEC) NOT DETECTED NOT DETECTED Final   Shigella/Enteroinvasive E coli (EIEC) NOT DETECTED NOT DETECTED Final   Cryptosporidium NOT DETECTED NOT DETECTED Final   Cyclospora cayetanensis NOT DETECTED NOT DETECTED Final   Entamoeba histolytica NOT DETECTED NOT DETECTED Final   Giardia lamblia NOT DETECTED NOT DETECTED Final   Adenovirus F40/41 NOT DETECTED NOT DETECTED Final   Astrovirus NOT DETECTED NOT DETECTED Final   Norovirus GI/GII NOT  DETECTED NOT DETECTED Final   Rotavirus A NOT DETECTED NOT DETECTED Final   Sapovirus (I, II, IV, and V) NOT DETECTED NOT DETECTED Final    Comment: Performed at [facility], [address] [place], [place] [address]  C Difficile Quick Screen w PCR reflex     Status: None   Collection Time: 03/21/20  1:50 PM   Specimen: STOOL  Result Value Ref Range Status   C Diff antigen NEGATIVE NEGATIVE Final   C Diff toxin NEGATIVE NEGATIVE Final   C Diff interpretation No C. difficile detected.  Final    Comment: Performed at [facility], [address] [place], [place] [address]  Expectorated sputum assessment w rflx to resp cult     Status: None   Collection Time: 03/25/20  4:00 PM   Specimen: Expectorated Sputum  Result Value Ref Range Status   Specimen Description Expect. Sput  Final   Special Requests NONE  Final   Sputum evaluation   Final    Sputum specimen not acceptable for testing.  Please recollect.   RESULT CALLED TO, READ BACK BY AND VERIFIED WITH: Su Hilt RN @ [id number] ON 03/25/20 BT ROBINSON Z.  Performed at [facility], [place] [address], [place], [place] [address]    Report Status 03/26/2020 FINAL  Final  Culture, respiratory     Status: None (Preliminary result)   Collection Time: 03/26/20  7:25 AM   Specimen: INDUCED SPUTUM  Result Value Ref Range Status   Specimen Description INDUCED SPUTUM  Final   Special Requests NONE  Final   Gram Stain   Final    FEW WBC PRESENT,BOTH PMN AND MONONUCLEAR FEW SQUAMOUS EPITHELIAL CELLS PRESENT FEW BUDDING YEAST SEEN RARE GRAM POSITIVE RODS    Culture   Final    CULTURE REINCUBATED FOR BETTER GROWTH Performed at [facility], [place] [address] [place], [place] [address]    Report Status PENDING  Incomplete  SARS CORONAVIRUS 2 (TAT 6-24 HRS) Nasopharyngeal Nasopharyngeal Swab     Status: None   Collection Time: 03/27/20  4:25 PM   Specimen: Nasopharyngeal Swab  Result Value Ref Range Status   SARS  Coronavirus 2 NEGATIVE NEGATIVE Final    Comment: (NOTE) SARS-CoV-2 target nucleic acids are NOT DETECTED. The SARS-CoV-2 RNA is generally detectable in upper and lower respiratory specimens during the acute phase of infection. Negative results do not preclude SARS-CoV-2 infection, do not rule out co-infections with other pathogens, and should not be used as the sole basis for treatment or other patient management decisions. Negative results must be combined with clinical observations, patient history, and epidemiological information. The expected result is Negative. Fact Sheet for Patients: SugarRoll.be Fact Sheet for Healthcare Providers: https://www.woods-mathews.com/ This test is not yet approved or cleared by the [place] FDA and  has been authorized for detection and/or diagnosis of SARS-CoV-2 by FDA under an Emergency Use Authorization (EUA). This EUA will remain  in effect (meaning this test can be used) for the duration of the COVID-19 declaration under Section 56 4(b)(1) of the Act, 21 U.S.C. section 360bbb-3(b)(1), unless the authorization is terminated or revoked sooner. Performed at [facility], [place] [address] [place], [place] [address]      Labs: Basic Metabolic Panel: Recent Labs  Lab 03/24/20 0346 03/25/20 0226 03/26/20 0235 03/27/20 0244 03/27/20 0849 03/28/20 0254  NA 138 135 136  --  135 135  K 4.4 4.7 4.9  --  4.7 5.1  CL 110 107 107  --  105 105  CO2 16* 16* 16*  --  17* 21*  GLUCOSE 133* 146* 128*  --  142* 116*  BUN 24* 34* 54*  --  68* 68*  CREATININE 1.25* 1.43* 1.53*  --  1.58* 1.51*  CALCIUM 9.0 8.9 9.0  --  9.2 9.4  MG 2.1 2.1 2.4 2.5*  --  2.7*  PHOS 4.1 5.3* 4.9* 5.0*  --  3.9   Liver Function Tests: Recent Labs  Lab 03/24/20 0346 03/25/20 0226 03/26/20 0235 03/27/20 0849 03/28/20 0254  AST 41 265* 203* 138* 108*  ALT 41  181* 225* 225* 208*  ALKPHOS 68 59 84 165* 235*  BILITOT  0.5 0.7 0.2* 0.6 0.8  PROT 6.6 6.1* 6.2* 6.0* 6.5  ALBUMIN 2.7* 2.7* 2.8* 2.9* 3.0*   CBC: Recent Labs  Lab 03/24/20 0346 03/25/20 0226 03/26/20 0235 03/27/20 0244 03/28/20 0254  WBC 12.5* 11.3* 15.9* 17.0* 20.6*  NEUTROABS  --   --  13.2* 13.3* 15.5*  HGB 8.3* 7.9* 8.4* 8.6* 9.2*  HCT 26.0* 25.2* 26.9* 27.5* 29.3*  MCV 93.9 93.7 95.7 95.5 94.8  PLT 357 342 416* 417* 448*   BNP (last 3 results) Recent Labs    03/20/20 0441  BNP 1,255.6*    Signed:  Barton Dubois MD.  [facility] 03/28/2020, 12:22 PM

## 2020-03-30 LAB — CULTURE, RESPIRATORY W GRAM STAIN

## 2020-04-02 DIAGNOSIS — I2510 Atherosclerotic heart disease of native coronary artery without angina pectoris: Secondary | ICD-10-CM | POA: Diagnosis not present

## 2020-04-02 DIAGNOSIS — I509 Heart failure, unspecified: Secondary | ICD-10-CM | POA: Diagnosis not present

## 2020-04-02 DIAGNOSIS — R5381 Other malaise: Secondary | ICD-10-CM | POA: Diagnosis not present

## 2020-04-02 DIAGNOSIS — G35 Multiple sclerosis: Secondary | ICD-10-CM | POA: Diagnosis not present

## 2020-04-03 ENCOUNTER — Inpatient Hospital Stay: Payer: Medicare Other | Admitting: Primary Care

## 2020-04-03 DIAGNOSIS — G35 Multiple sclerosis: Secondary | ICD-10-CM | POA: Diagnosis not present

## 2020-04-03 DIAGNOSIS — I10 Essential (primary) hypertension: Secondary | ICD-10-CM | POA: Diagnosis not present

## 2020-04-03 DIAGNOSIS — I2510 Atherosclerotic heart disease of native coronary artery without angina pectoris: Secondary | ICD-10-CM | POA: Diagnosis not present

## 2020-04-03 DIAGNOSIS — I509 Heart failure, unspecified: Secondary | ICD-10-CM | POA: Diagnosis not present

## 2020-04-06 DIAGNOSIS — R5381 Other malaise: Secondary | ICD-10-CM | POA: Diagnosis not present

## 2020-04-06 DIAGNOSIS — I509 Heart failure, unspecified: Secondary | ICD-10-CM | POA: Diagnosis not present

## 2020-04-06 DIAGNOSIS — I2510 Atherosclerotic heart disease of native coronary artery without angina pectoris: Secondary | ICD-10-CM | POA: Diagnosis not present

## 2020-04-06 DIAGNOSIS — G35 Multiple sclerosis: Secondary | ICD-10-CM | POA: Diagnosis not present

## 2020-04-28 DIAGNOSIS — J9600 Acute respiratory failure, unspecified whether with hypoxia or hypercapnia: Secondary | ICD-10-CM | POA: Diagnosis not present

## 2020-04-30 ENCOUNTER — Telehealth: Payer: Self-pay

## 2020-04-30 NOTE — Telephone Encounter (Signed)
Mickel Baas PT with [facility] left v/m requesting verbal orders for [facility] PT 3 x a wk for 1 wk; 2 x a wk for 4 wks and 1 x a wk for 2 wks.

## 2020-05-01 NOTE — Telephone Encounter (Signed)
Message left for Mickel Baas to return my call.

## 2020-05-01 NOTE — Telephone Encounter (Signed)
Approved.  

## 2020-05-01 NOTE — Telephone Encounter (Signed)
Gave the approval for the verbal orders 

## 2020-05-03 ENCOUNTER — Ambulatory Visit (INDEPENDENT_AMBULATORY_CARE_PROVIDER_SITE_OTHER)
Admission: RE | Admit: 2020-05-03 | Discharge: 2020-05-03 | Disposition: A | Discharge status: Home / Self Care | Payer: Medicare Other | Source: Ambulatory Visit | Attending: Primary Care | Admitting: Primary Care

## 2020-05-03 ENCOUNTER — Ambulatory Visit (INDEPENDENT_AMBULATORY_CARE_PROVIDER_SITE_OTHER): Payer: Medicare Other | Admitting: Primary Care

## 2020-05-03 ENCOUNTER — Other Ambulatory Visit: Payer: Self-pay

## 2020-05-03 ENCOUNTER — Encounter: Payer: Self-pay | Admitting: Primary Care

## 2020-05-03 VITALS — BP 114/70 | HR 94 | Temp 96.7°F | Ht 63.0 in | Wt 132.5 lb

## 2020-05-03 DIAGNOSIS — R1900 Intra-abdominal and pelvic swelling, mass and lump, unspecified site: Secondary | ICD-10-CM

## 2020-05-03 DIAGNOSIS — D649 Anemia, unspecified: Secondary | ICD-10-CM

## 2020-05-03 DIAGNOSIS — G4700 Insomnia, unspecified: Secondary | ICD-10-CM | POA: Diagnosis not present

## 2020-05-03 DIAGNOSIS — N179 Acute kidney failure, unspecified: Secondary | ICD-10-CM

## 2020-05-03 DIAGNOSIS — Z720 Tobacco use: Secondary | ICD-10-CM

## 2020-05-03 DIAGNOSIS — R0602 Shortness of breath: Secondary | ICD-10-CM | POA: Diagnosis not present

## 2020-05-03 DIAGNOSIS — J189 Pneumonia, unspecified organism: Secondary | ICD-10-CM

## 2020-05-03 DIAGNOSIS — I272 Pulmonary hypertension, unspecified: Secondary | ICD-10-CM | POA: Diagnosis not present

## 2020-05-03 DIAGNOSIS — I10 Essential (primary) hypertension: Secondary | ICD-10-CM

## 2020-05-03 DIAGNOSIS — I214 Non-ST elevation (NSTEMI) myocardial infarction: Secondary | ICD-10-CM | POA: Diagnosis not present

## 2020-05-03 DIAGNOSIS — R05 Cough: Secondary | ICD-10-CM | POA: Diagnosis not present

## 2020-05-03 DIAGNOSIS — J9601 Acute respiratory failure with hypoxia: Secondary | ICD-10-CM | POA: Diagnosis not present

## 2020-05-03 DIAGNOSIS — R630 Anorexia: Secondary | ICD-10-CM

## 2020-05-03 DIAGNOSIS — I5021 Acute systolic (congestive) heart failure: Secondary | ICD-10-CM | POA: Diagnosis not present

## 2020-05-03 LAB — CBC
HCT: 27.4 % — ABNORMAL LOW (ref 36.0–46.0)
Hemoglobin: 8.9 g/dL — ABNORMAL LOW (ref 12.0–15.0)
MCHC: 32.6 g/dL (ref 30.0–36.0)
MCV: 82.9 fl (ref 78.0–100.0)
Platelets: 361 10*3/uL (ref 150.0–400.0)
RBC: 3.31 Mil/uL — ABNORMAL LOW (ref 3.87–5.11)
RDW: 20.7 % — ABNORMAL HIGH (ref 11.5–15.5)
WBC: 9 10*3/uL (ref 4.0–10.5)

## 2020-05-03 LAB — BASIC METABOLIC PANEL
BUN: 17 mg/dL (ref 6–23)
CO2: 26 mEq/L (ref 19–32)
Calcium: 8.6 mg/dL (ref 8.4–10.5)
Chloride: 102 mEq/L (ref 96–112)
Creatinine, Ser: 0.86 mg/dL (ref 0.40–1.20)
GFR: 67.96 mL/min (ref >60.00)
Glucose, Bld: 89 mg/dL (ref 70–99)
Potassium: 3.8 mEq/L (ref 3.5–5.1)
Sodium: 135 mEq/L (ref 135–145)

## 2020-05-03 MED ORDER — MIRTAZAPINE 7.5 MG PO TABS: 7.5000 mg | ORAL_TABLET | Freq: Every day | ORAL | 0 refills | Status: DC

## 2020-05-03 NOTE — Assessment & Plan Note (Addendum)
Secondary to CKD? Repeat CBC pending.

## 2020-05-03 NOTE — Assessment & Plan Note (Signed)
Commended her on cessation.

## 2020-05-03 NOTE — Assessment & Plan Note (Signed)
During recent hospital visit. Improved prior to discharge. Repeat BMP pending.

## 2020-05-03 NOTE — Assessment & Plan Note (Signed)
Trial of mirtazapine provided today. Hopefully this will help with insomnia and appetite.

## 2020-05-03 NOTE — Assessment & Plan Note (Signed)
Recent diagnosis, left heart cath with complete RCA blockage, 50-70% stenosis to left. Not a candidate for CABG, no stents placed. Collateral circulation present.   Continue statin therapy and BP control. Referral placed to cardiology.

## 2020-05-03 NOTE — Patient Instructions (Signed)
Complete xray(s) and lab prior to leaving today. I will notify you of your results once received.  You will be contacted regarding your referral to cardiology.  Please let us know if you have not been contacted within one week.  Start mirtazapine (Remeron) 7.5 mg at night for sleep and appetite. Please update me in a few weeks.  It was a pleasure to see you today!

## 2020-05-03 NOTE — Progress Notes (Signed)
Subjective:    Patient ID: Anita [name], female    DOB: 1961/11/27, 59 y.o.   MRN: [id number]  HPI  This visit occurred during the SARS-CoV-2 public health emergency.  Safety protocols were in place, including screening questions prior to the visit, additional usage of staff PPE, and extensive cleaning of exam room while observing appropriate contact time as indicated for disinfecting solutions.   Anita [name] is a 59 year old female with a history of multiple sclerosis with optic neuritis, migraines, GERD, arthritis, tobacco abuse who presents today for hospital follow up.  She presented to [facility] on 03/20/20 with a chief complaint of shortness of breath, chest congestion, lightheadedness, diarrhea. Presentation in the ED included tachycardia (HR 113-153) tachypnea, mild hypoxia, elevated troponin at 2581 and then 2513, BNP of 1255. Chest xray with concerns of atypical pneumonia vs edema. She was treated with heparin and IV antibiotics and admitted for evaluation and treatment.   During her hospital stay she was treated for acute respiratory failure with hypoxia, atypical pneumonia with antibiotics, steroids, nebulizer's, bronchodilators, supplemental oxygen. It was recommended she see pulmonology in the outpatient setting for COPD maintenance.   She underwent echocardiogram which showed LVEF of 40-45%. She underwent cardiac catheterization which showed CAD with complete blockage to RCA, moderate narrowing to left. No stents placed, not a candidate for surgery. She was initiated on carvedilol and losartan, also referred to outpatient cardiology services. She was noted to have prolonged QT interval.   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   She was discharged to SNF, [facility], on 03/28/20. While there she underwent physical therapy and monitoring. She was discharged home on 04/23/20.     Since discharge home she has been working with home health PT for the last week. Prior to her hospital stay she was ambulatory with a walker, she was unable to walk with a walker when discharged home to SNF.  Her daughter reports that she never received the carvedilol prescription, and she was not provided this while in the SNF. She did receive losartan. She has not yet connected with cardiology. She denies chest pain. She is not wearing supplemental oxygen as her saturation levels are typically 97% and she has very little shortness of breath.   Since she's been home she's noticed anxiety, worse at night. Symptoms include feeling nervous and scared, decrease in appetite, difficulty sleeping. She denies a history of anxiety and had no problems prior to her illness. Her daughter is asking for treatment to help reduce anxiety.   BP Readings from Last 3 Encounters:  05/03/20 114/70  03/28/20 131/88  03/19/18 (!) 160/90     Review of Systems  Respiratory: Negative for shortness of breath.   Cardiovascular: Negative for chest pain.  Neurological: Positive for weakness. Negative for headaches.  Psychiatric/Behavioral: Positive for sleep disturbance. The patient is nervous/anxious.        Past Medical History:  Diagnosis Date  . Abnormality of gait 07/26/2013  . Allergy   . Arthritis   . Chickenpox   . GERD (gastroesophageal reflux disease)   . Headache(784.0)    Migraine  . Hearing loss    Right ear secondary to infection  . History of shingles   . Migraines   . Multiple sclerosis ([place])   . Obese   . Optic neuritis      Social History   Socioeconomic History  . Marital status: Divorced  Spouse name: Not on file  . Number of children: Not on file  . Years of education: Not on file  . Highest education level: Not on file  Occupational History  . Not on file  Tobacco Use  . Smoking status: Former Smoker    Packs/day: 1.00    Types: Cigarettes  . Smokeless tobacco:  Never Used  Substance and Sexual Activity  . Alcohol use: Yes    Comment: Occasional  . Drug use: Not on file  . Sexual activity: Not on file  Other Topics Concern  . Not on file  Social History Narrative  . Not on file   Social Determinants of Health   Financial Resource Strain:   . Difficulty of Paying Living Expenses:   Food Insecurity:   . Worried About Charity fundraiser in the Last Year:   . Arboriculturist in the Last Year:   Transportation Needs:   . Film/video editor (Medical):   Marland Kitchen Lack of Transportation (Non-Medical):   Physical Activity:   . Days of Exercise per Week:   . Minutes of Exercise per Session:   Stress:   . Feeling of Stress :   Social Connections:   . Frequency of Communication with Friends and Family:   . Frequency of Social Gatherings with Friends and Family:   . Attends Religious Services:   . Active Member of Clubs or Organizations:   . Attends Archivist Meetings:   Marland Kitchen Marital Status:   Intimate Partner Violence:   . Fear of Current or Ex-Partner:   . Emotionally Abused:   Marland Kitchen Physically Abused:   . Sexually Abused:     Past Surgical History:  Procedure Laterality Date  . Ganglioneuroma     Resection  . LEFT HEART CATH AND CORONARY ANGIOGRAPHY N/A 03/23/2020   Procedure: LEFT HEART CATH AND CORONARY ANGIOGRAPHY;  Surgeon: Leonie Man, MD;  Location: [facility];  Service: Cardiovascular;  Laterality: N/A;  . PILONIDAL CYST EXCISION    . PILONIDAL CYST EXCISION  1983  . TUMOR REMOVAL  2007/2008    Family History  Problem Relation Age of Onset  . Cancer Father   . Cancer Sister   . Heart disease Brother   . Bipolar disorder Sister     Allergies  Allergen Reactions  . Acthar Hp [Corticotropin] Other (See Comments)    Throat swelling and coughing up blood  . Codeine   . Prednisone     Current Outpatient Medications on File Prior to Visit  Medication Sig Dispense Refill  . acetaminophen (TYLENOL) 325  MG tablet Take 2 tablets (650 mg total) by mouth every 6 (six) hours as needed for mild pain (or Fever >/= 101).    Marland Kitchen aspirin EC 81 MG EC tablet Take 1 tablet (81 mg total) by mouth daily.    . budesonide (PULMICORT) 0.25 MG/2ML nebulizer solution Take 4 mLs (0.5 mg total) by nebulization 2 (two) times daily.    . calcium carbonate (TUMS EX) 750 MG chewable tablet Chew 1 tablet by mouth daily as needed for heartburn.    . carvedilol (COREG) 6.25 MG tablet Take 1 tablet (6.25 mg total) by mouth 2 (two) times daily with a meal.    . cetirizine (ZYRTEC) 10 MG tablet Take 10 mg by mouth daily.    . Doxylamine Succinate, Sleep, (SLEEP AID PO) Take 1 tablet by mouth at bedtime as needed (sleep).     . feeding  supplement, ENSURE ENLIVE, (ENSURE ENLIVE) LIQD Take 237 mLs by mouth 2 (two) times daily between meals.    Marland Kitchen guaiFENesin (MUCINEX) 600 MG 12 hr tablet Take 600 mg by mouth 2 (two) times daily as needed for cough.    . levalbuterol (XOPENEX) 0.63 MG/3ML nebulizer solution Take 3 mLs (0.63 mg total) by nebulization every 6 (six) hours as needed for wheezing or shortness of breath.    . losartan (COZAAR) 25 MG tablet Take 0.5 tablets (12.5 mg total) by mouth daily.    . Multiple Vitamin (MULTIVITAMIN WITH MINERALS) TABS tablet Take 1 tablet by mouth daily.    . nicotine (NICODERM CQ - DOSED IN MG/24 HOURS) 21 mg/24hr patch Place 1 patch (21 mg total) onto the skin daily. 28 patch 0  . rosuvastatin (CRESTOR) 20 MG tablet Take 1 tablet (20 mg total) by mouth daily.    . SUMAtriptan (IMITREX) 25 MG tablet Take 1 tablet by mouth at migraine onset, may repeat in 2 hours if headache persists or recurs. Do not exceed 2 tablets in 24 hours. 10 tablet 0   No current facility-administered medications on file prior to visit.    BP 114/70   Pulse 94   Temp (!) 96.7 F (35.9 C) (Temporal)   Ht 5\' 3"  (1.6 m)   Wt 132 lb 8 oz (60.1 kg)   SpO2 98%   BMI 23.47 kg/m    Objective:   Physical Exam   Constitutional: She appears well-nourished.  Cardiovascular: Normal rate and regular rhythm.  Respiratory: Effort normal and breath sounds normal.  Musculoskeletal:     Cervical back: Neck supple.  Skin: Skin is warm and dry.  Psychiatric: She has a normal mood and affect.           Assessment & Plan:

## 2020-05-03 NOTE — Assessment & Plan Note (Signed)
Treated during recent hospital admission, further treatment and monitoring in SNF.  Continue PRN oxygen, nebulizer treatment. Repeat chest xray pending.   Hospital labs, imaging, notes reviewed.

## 2020-05-03 NOTE — Assessment & Plan Note (Signed)
Well controlled in the office today on losartan. Not managed on carvedilol as this was never provided. Will wait for cardiology evaluation for determination. She would need a lower dose given low/normal BP readings.

## 2020-05-03 NOTE — Assessment & Plan Note (Signed)
Declines oncology evaluation/consultation. She is not a surgical candidate.

## 2020-05-03 NOTE — Assessment & Plan Note (Signed)
Diagnosed during recent hospital visit. Repeat chest xray pending.  Hospital labs, imaging, notes reviewed.

## 2020-05-09 ENCOUNTER — Other Ambulatory Visit: Payer: Self-pay | Admitting: Primary Care

## 2020-05-09 DIAGNOSIS — K625 Hemorrhage of anus and rectum: Secondary | ICD-10-CM

## 2020-05-09 DIAGNOSIS — D649 Anemia, unspecified: Secondary | ICD-10-CM

## 2020-05-18 ENCOUNTER — Telehealth: Payer: Self-pay

## 2020-05-18 DIAGNOSIS — I214 Non-ST elevation (NSTEMI) myocardial infarction: Secondary | ICD-10-CM

## 2020-05-18 MED ORDER — ROSUVASTATIN CALCIUM 20 MG PO TABS: 20.0000 mg | ORAL_TABLET | Freq: Every day | ORAL | 3 refills | Status: DC

## 2020-05-18 NOTE — Telephone Encounter (Signed)
Refill(s) sent to pharmacy.;;s

## 2020-05-18 NOTE — Telephone Encounter (Signed)
Sonia Baller with pt's home health team (did not leave title or company name) left a message on triage line asked about getting some parameters set for the pt in regards to her BP. When the pt checks it and when they check it, they often get readings below 110. She is a fall risk and they are afraid if her BP drops too low, she may fall. She is still taking 1/2 a losartan daily. Please advise.

## 2020-05-18 NOTE — Telephone Encounter (Signed)
Spoken and notified Sonia Baller of Anda Kraft Clark's comments. Also called and patient's daughter aware as well. Patient's daughter also request refill of Crestor until patient can seen by cardiology on 06/01/2020. Patient have only a few pills left.

## 2020-05-18 NOTE — Telephone Encounter (Signed)
Noted, have her stop the losartan medication if blood pressures are consistently less than 110 on top and or less than 60 on bottom.  Especially if she develops symptoms of dizziness, weakness.  Have them monitor blood pressure if we stop losartan.

## 2020-05-28 DIAGNOSIS — D509 Iron deficiency anemia, unspecified: Secondary | ICD-10-CM | POA: Diagnosis not present

## 2020-05-28 DIAGNOSIS — G40909 Epilepsy, unspecified, not intractable, without status epilepticus: Secondary | ICD-10-CM | POA: Diagnosis not present

## 2020-05-28 DIAGNOSIS — D649 Anemia, unspecified: Secondary | ICD-10-CM | POA: Diagnosis not present

## 2020-05-28 DIAGNOSIS — Z8701 Personal history of pneumonia (recurrent): Secondary | ICD-10-CM | POA: Diagnosis not present

## 2020-05-28 DIAGNOSIS — I4581 Long QT syndrome: Secondary | ICD-10-CM | POA: Diagnosis not present

## 2020-05-28 DIAGNOSIS — F17210 Nicotine dependence, cigarettes, uncomplicated: Secondary | ICD-10-CM | POA: Diagnosis not present

## 2020-05-28 DIAGNOSIS — G4700 Insomnia, unspecified: Secondary | ICD-10-CM | POA: Diagnosis not present

## 2020-05-28 DIAGNOSIS — D3610 Benign neoplasm of peripheral nerves and autonomic nervous system, unspecified: Secondary | ICD-10-CM | POA: Diagnosis not present

## 2020-05-28 DIAGNOSIS — E669 Obesity, unspecified: Secondary | ICD-10-CM | POA: Diagnosis not present

## 2020-05-28 DIAGNOSIS — M1990 Unspecified osteoarthritis, unspecified site: Secondary | ICD-10-CM | POA: Diagnosis not present

## 2020-05-28 DIAGNOSIS — G35 Multiple sclerosis: Secondary | ICD-10-CM | POA: Diagnosis not present

## 2020-05-28 DIAGNOSIS — I110 Hypertensive heart disease with heart failure: Secondary | ICD-10-CM | POA: Diagnosis not present

## 2020-05-28 DIAGNOSIS — K219 Gastro-esophageal reflux disease without esophagitis: Secondary | ICD-10-CM | POA: Diagnosis not present

## 2020-05-28 DIAGNOSIS — Z9981 Dependence on supplemental oxygen: Secondary | ICD-10-CM | POA: Diagnosis not present

## 2020-05-28 DIAGNOSIS — J9600 Acute respiratory failure, unspecified whether with hypoxia or hypercapnia: Secondary | ICD-10-CM | POA: Diagnosis not present

## 2020-05-28 DIAGNOSIS — I5020 Unspecified systolic (congestive) heart failure: Secondary | ICD-10-CM | POA: Diagnosis not present

## 2020-05-28 DIAGNOSIS — J449 Chronic obstructive pulmonary disease, unspecified: Secondary | ICD-10-CM | POA: Diagnosis not present

## 2020-05-28 DIAGNOSIS — E785 Hyperlipidemia, unspecified: Secondary | ICD-10-CM | POA: Diagnosis not present

## 2020-05-28 DIAGNOSIS — H9191 Unspecified hearing loss, right ear: Secondary | ICD-10-CM | POA: Diagnosis not present

## 2020-05-28 DIAGNOSIS — Z6826 Body mass index (BMI) 26.0-26.9, adult: Secondary | ICD-10-CM | POA: Diagnosis not present

## 2020-05-28 DIAGNOSIS — H469 Unspecified optic neuritis: Secondary | ICD-10-CM | POA: Diagnosis not present

## 2020-05-28 DIAGNOSIS — I2510 Atherosclerotic heart disease of native coronary artery without angina pectoris: Secondary | ICD-10-CM | POA: Diagnosis not present

## 2020-05-28 DIAGNOSIS — I272 Pulmonary hypertension, unspecified: Secondary | ICD-10-CM | POA: Diagnosis not present

## 2020-05-29 DIAGNOSIS — G40909 Epilepsy, unspecified, not intractable, without status epilepticus: Secondary | ICD-10-CM | POA: Diagnosis not present

## 2020-05-29 DIAGNOSIS — I110 Hypertensive heart disease with heart failure: Secondary | ICD-10-CM | POA: Diagnosis not present

## 2020-05-29 DIAGNOSIS — J449 Chronic obstructive pulmonary disease, unspecified: Secondary | ICD-10-CM | POA: Diagnosis not present

## 2020-05-29 DIAGNOSIS — G35 Multiple sclerosis: Secondary | ICD-10-CM | POA: Diagnosis not present

## 2020-05-29 DIAGNOSIS — I2510 Atherosclerotic heart disease of native coronary artery without angina pectoris: Secondary | ICD-10-CM | POA: Diagnosis not present

## 2020-05-29 DIAGNOSIS — I5020 Unspecified systolic (congestive) heart failure: Secondary | ICD-10-CM | POA: Diagnosis not present

## 2020-05-31 DIAGNOSIS — G40909 Epilepsy, unspecified, not intractable, without status epilepticus: Secondary | ICD-10-CM | POA: Diagnosis not present

## 2020-05-31 DIAGNOSIS — J449 Chronic obstructive pulmonary disease, unspecified: Secondary | ICD-10-CM | POA: Diagnosis not present

## 2020-05-31 DIAGNOSIS — I2510 Atherosclerotic heart disease of native coronary artery without angina pectoris: Secondary | ICD-10-CM | POA: Diagnosis not present

## 2020-05-31 DIAGNOSIS — I5020 Unspecified systolic (congestive) heart failure: Secondary | ICD-10-CM | POA: Diagnosis not present

## 2020-05-31 DIAGNOSIS — I110 Hypertensive heart disease with heart failure: Secondary | ICD-10-CM | POA: Diagnosis not present

## 2020-05-31 DIAGNOSIS — G35 Multiple sclerosis: Secondary | ICD-10-CM | POA: Diagnosis not present

## 2020-05-31 NOTE — Progress Notes (Signed)
Cardiology Clinic Note   Patient Name: Anita [name] Date of Encounter: 06/01/2020  Primary Care Provider:  [name], NP Primary Cardiologist:  Buford Dresser, MD  Patient Profile    Anita [name] 59 year old female presents today for follow-up evaluation of her tachycardia, systolic heart failure, and coronary artery disease  Past Medical History    Past Medical History:  Diagnosis Date   Abnormality of gait 07/26/2013   Acute respiratory distress 03/20/2020   Allergy    Arthritis    Chickenpox    Elevated troponin 03/22/2020   GERD (gastroesophageal reflux disease)    Headache(784.0)    Migraine   Hearing loss    Right ear secondary to infection   History of shingles    Migraines    Multiple sclerosis (HCC)    Obese    Optic neuritis    Prolonged QT interval 03/20/2020   Past Surgical History:  Procedure Laterality Date   Ganglioneuroma     Resection   LEFT HEART CATH AND CORONARY ANGIOGRAPHY N/A 03/23/2020   Procedure: LEFT HEART CATH AND CORONARY ANGIOGRAPHY;  Surgeon: Leonie Man, MD;  Location: [facility];  Service: Cardiovascular;  Laterality: N/A;   PILONIDAL CYST EXCISION     PILONIDAL CYST EXCISION  1983   TUMOR REMOVAL  2007/2008    Allergies  Allergies  Allergen Reactions   Acthar Hp [Corticotropin] Other (See Comments)    Throat swelling and coughing up blood   Codeine    Prednisone     History of Present Illness    Anita [name] has a past medical history of multiple sclerosis, acute respiratory distress, fevers, acute systolic heart failure, and chronic diffuse moderate coronary artery disease.  She underwent cardiac catheterization on 03/23/2020 which showed LST RCA-distal RCA 100% stenosed, PDA and PDL systems fill via collaterals, mid LAD 60% stenosis, mid LAD-2 50% stenosis, distal LAD 55% stenosis with 70% stenosed side branch of second diagonal.  First diagonal lesion 70%  stenosed.  LST circumflex to proximal circumflex 45% stenosed, proximal mid circumflex lesion 65% stenosed.  Aggressive medical management recommended.  She is not a candidate for CABG.  Echocardiogram 03/20/2020 showed an LVEF of 40-45%, global hypokinesis, left ventricle mildly dilated, moderately elevated pulmonary artery systolic pressure, left atrial size mildly dilated.  Rheumatic mitral valve with moderate to severe mitral valve regurgitation and mild mitral stenosis with a mean gradient of 7.3 mmHg  She presents to the clinic today for follow-up evaluation and states she feels well.  Her cardiac catheterization results were reviewed and explained.  She has not had any further episodes of chest pain.  She is adhering to a low-sodium diet.  I have asked her to increase her physical activity.  She states that she did not receive carvedilol and was worried about her blood pressure being too low.  Today her blood pressure is 125/60.  I will start her on 3.125 mg twice daily.  I have also reviewed her other cardiac medications and discussed the importance of rosuvastatin.  I will give her the salty 6 diet sheet, repeat lipid and liver panel in 6 weeks, increase her physical activity as tolerated and follow-up with Dr. Harrell Gave in 3 months.  Today she denies chest pain, shortness of breath, lower extremity edema, fatigue, palpitations, melena, hematuria, hemoptysis, diaphoresis, weakness, presyncope, syncope, orthopnea, and PND.   Home Medications    Prior to Admission medications   Medication Sig Start Date End Date Taking?  Authorizing Provider  acetaminophen (TYLENOL) 325 MG tablet Take 2 tablets (650 mg total) by mouth every 6 (six) hours as needed for mild pain (or Fever >/= 101). 03/28/20   Barton Dubois, MD  aspirin EC 81 MG EC tablet Take 1 tablet (81 mg total) by mouth daily. 03/29/20   Barton Dubois, MD  budesonide (PULMICORT) 0.25 MG/2ML nebulizer solution Take 4 mLs (0.5 mg total) by  nebulization 2 (two) times daily. 03/28/20   Barton Dubois, MD  calcium carbonate (TUMS EX) 750 MG chewable tablet Chew 1 tablet by mouth daily as needed for heartburn.    [provider]  carvedilol (COREG) 6.25 MG tablet Take 1 tablet (6.25 mg total) by mouth 2 (two) times daily with a meal. 03/28/20   Barton Dubois, MD  cetirizine (ZYRTEC) 10 MG tablet Take 10 mg by mouth daily.    [provider]  Doxylamine Succinate, Sleep, (SLEEP AID PO) Take 1 tablet by mouth at bedtime as needed (sleep).     [provider]  feeding supplement, ENSURE ENLIVE, (ENSURE ENLIVE) LIQD Take 237 mLs by mouth 2 (two) times daily between meals. 03/28/20   Barton Dubois, MD  guaiFENesin (MUCINEX) 600 MG 12 hr tablet Take 600 mg by mouth 2 (two) times daily as needed for cough.    [provider]  levalbuterol Penne Lash) 0.63 MG/3ML nebulizer solution Take 3 mLs (0.63 mg total) by nebulization every 6 (six) hours as needed for wheezing or shortness of breath. 03/28/20   Barton Dubois, MD  losartan (COZAAR) 25 MG tablet Take 0.5 tablets (12.5 mg total) by mouth daily. 03/28/20   Barton Dubois, MD  mirtazapine (REMERON) 7.5 MG tablet Take 1 tablet (7.5 mg total) by mouth at bedtime. For sleep and appetite. 05/03/20   [name], NP  Multiple Vitamin (MULTIVITAMIN WITH MINERALS) TABS tablet Take 1 tablet by mouth daily. 03/29/20   Barton Dubois, MD  nicotine (NICODERM CQ - DOSED IN MG/24 HOURS) 21 mg/24hr patch Place 1 patch (21 mg total) onto the skin daily. 03/29/20   Barton Dubois, MD  rosuvastatin (CRESTOR) 20 MG tablet Take 1 tablet (20 mg total) by mouth daily. For cholesterol. 05/18/20   [name], NP  SUMAtriptan (IMITREX) 25 MG tablet Take 1 tablet by mouth at migraine onset, may repeat in 2 hours if headache persists or recurs. Do not exceed 2 tablets in 24 hours. 02/15/18   [name], NP    Family History    Family History  Problem Relation Age of  Onset   Cancer Father    Cancer Sister    Heart disease Brother    Bipolar disorder Sister    She indicated that her mother is alive. She indicated that her father is deceased. She indicated that both of her sisters are alive. She indicated that her brother is alive.  Social History    Social History   Socioeconomic History   Marital status: Divorced    Spouse name: Not on file   Number of children: Not on file   Years of education: Not on file   Highest education level: Not on file  Occupational History   Not on file  Tobacco Use   Smoking status: Former Smoker    Packs/day: 1.00    Types: Cigarettes   Smokeless tobacco: Never Used  Substance and Sexual Activity   Alcohol use: Yes    Comment: Occasional   Drug use: Not on file   Sexual activity:  Not on file  Other Topics Concern   Not on file  Social History Narrative   Not on file   Social Determinants of Health   Financial Resource Strain:    Difficulty of Paying Living Expenses:   Food Insecurity:    Worried About Banks in the Last Year:    Arboriculturist in the Last Year:   Transportation Needs:    Film/video editor (Medical):    Lack of Transportation (Non-Medical):   Physical Activity:    Days of Exercise per Week:    Minutes of Exercise per Session:   Stress:    Feeling of Stress :   Social Connections:    Frequency of Communication with Friends and Family:    Frequency of Social Gatherings with Friends and Family:    Attends Religious Services:    Active Member of Clubs or Organizations:    Attends Music therapist:    Marital Status:   Intimate Partner Violence:    Fear of Current or Ex-Partner:    Emotionally Abused:    Physically Abused:    Sexually Abused:      Review of Systems    General:  No chills, fever, night sweats or weight changes.  Cardiovascular:  No chest pain, dyspnea on exertion, edema, orthopnea,  palpitations, paroxysmal nocturnal dyspnea. Dermatological: No rash, lesions/masses Respiratory: No cough, dyspnea Urologic: No hematuria, dysuria Abdominal:   No nausea, vomiting, diarrhea, bright red blood per rectum, melena, or hematemesis Neurologic:  No visual changes, wkns, changes in mental status. All other systems reviewed and are otherwise negative except as noted above.  Physical Exam    VS:  BP 125/60    Pulse 98    Temp (!) 97.3 F (36.3 C)    Ht '5\' 3"'  (1.6 m)    Wt 127 lb 3.2 oz (57.7 kg)    SpO2 97%    BMI 22.53 kg/m  , BMI Body mass index is 22.53 kg/m. GEN: Well nourished, well developed, in no acute distress. HEENT: normal. Neck: Supple, no JVD, carotid bruits, or masses. Cardiac: RRR, no murmurs, rubs, or gallops. No clubbing, cyanosis, edema.  Radials/DP/PT 2+ and equal bilaterally.  Respiratory:  Respirations regular and unlabored, clear to auscultation bilaterally. GI: Soft, nontender, nondistended, BS + x 4. MS: no deformity or atrophy. Skin: warm and dry, no rash. Neuro:  Strength and sensation are intact. Psych: Normal affect.  Accessory Clinical Findings    ECG personally reviewed by me today-none today.  Echocardiogram 03/20/2020 IMPRESSIONS    1. Left ventricular ejection fraction, by estimation, is 40 to 45%. The  left ventricle has mildly decreased function. The left ventricle  demonstrates global hypokinesis. The left ventricular internal cavity size  was mildly dilated. Left ventricular  diastolic function could not be evaluated.  2. Right ventricular systolic function is normal. The right ventricular  size is normal. There is moderately elevated pulmonary artery systolic  pressure.  3. Left atrial size was mildly dilated.  4. Moderate mitral subvalvular thickening/fibrosis.  5. The mitral valve is rheumatic. Moderate to severe mitral valve  regurgitation. Mild mitral stenosis. The mean mitral valve gradient is 7.3  mmHg with average  heart rate of 105 bpm.  6. The aortic valve is normal in structure. Aortic valve regurgitation is  not visualized.  7. The inferior vena cava is dilated in size with <50% respiratory  variability, suggesting right atrial pressure of 15 mmHg.   Conclusion(s)/Recommendation(s):  Consider TEE for better mitral  regurgitation evaluation.  Cardiac catheterization 03/23/2020  Hemodynamics: LV end diastolic pressure is moderately elevated.  ----Coronary Angiography-----  Ost RCA to Dist RCA lesion is 100% stenosed.-The PDA and PL system fills via faint collaterals from the AV groove LCx and LAD septals.  Mid LAD-1 lesion is 60% stenosed. Mid LAD-2 lesion is 50% stenosed. Dist LAD lesion is 55% stenosed with 70% stenosed side branch in 2nd Diag.  1st Diag lesion is 70% stenosed.  Ost Cx to Prox Cx lesion is 45% stenosed. Prox-MID Cx lesion is 65% stenosed.    MODERATE-SEVERE THREE-VESSEL CAD: ? 100% proximal RCA occlusion with left-to-right collaterals faintly filling PDA and PL system (AV groove LCx-PL and LAD septal-PDA) ? Diffuse moderate mid LAD disease with 60% to 50% stenosis. ? Tandem 50% and 65% proximal and mid LCx with the 65% lesion being the most significant lesion.  Moderately elevated LVEDP of 18-20 mmHg  Given the extent of disease in the LAD and LCx, neither lesion is very amenable to PCI, and RCA is chronically occluded.  Given her comorbidities, I do not think she would be a good CABG candidate especially in light of the fact that she would likely require valve surgery as well.  She would not recover well.  Recommendations aggressive medical management.   Diagnostic Dominance: Right  Intervention    Assessment & Plan   1.  Coronary artery disease-no chest pain today.  Cardiac catheterization 03/23/2020 showed severe-moderate multivessel disease, some collaterals, not a good candidate for CABG.  Aggressive medical management recommended. Continue aspirin,  carvedilol, losartan, rosuvastatin Imitrex stopped due to CAD. Heart healthy low-sodium diet-salty 6 given Increase physical activity as tolerated  Hyperlipidemia-03/20/2020: Triglycerides 80 Continue rosuvastatin Heart healthy low-sodium high-fiber diet Increase physical activity as tolerated Lipid panel and liver   Essential hypertension-BP today 125/60.  Continues to be somewhat hypotensive with pressures in the 125/60 Continue  Losartan Start carvedilol 3.125 mg twice daily Heart healthy low-sodium diet-salty 6 given Increase physical activity as tolerated  Atypical pneumonia/acute respiratory failure with hypoxia-breathing much better today.  CT angio 4/13 consistent with viral/atypical pneumonia.  Viral panel is negative.  Received IV Rocephin and azithromycin as well as IV fluids. Followed by internal medicine  Disposition: Follow-up with Dr. Harrell Gave in 3 months.  Jossie Ng. Sundra Haddix NP-C    06/01/2020, 4:27 PM [address] Office [phone] Fax [phone]

## 2020-06-01 ENCOUNTER — Ambulatory Visit (INDEPENDENT_AMBULATORY_CARE_PROVIDER_SITE_OTHER): Payer: Medicare Other | Admitting: General Practice

## 2020-06-01 ENCOUNTER — Other Ambulatory Visit: Payer: Self-pay

## 2020-06-01 ENCOUNTER — Encounter: Payer: Self-pay | Admitting: General Practice

## 2020-06-01 ENCOUNTER — Other Ambulatory Visit: Payer: Self-pay | Admitting: Primary Care

## 2020-06-01 VITALS — BP 125/60 | HR 98 | Temp 97.3°F | Ht 63.0 in | Wt 127.2 lb

## 2020-06-01 DIAGNOSIS — J189 Pneumonia, unspecified organism: Secondary | ICD-10-CM | POA: Diagnosis not present

## 2020-06-01 DIAGNOSIS — E7800 Pure hypercholesterolemia, unspecified: Secondary | ICD-10-CM | POA: Diagnosis not present

## 2020-06-01 DIAGNOSIS — G4700 Insomnia, unspecified: Secondary | ICD-10-CM

## 2020-06-01 DIAGNOSIS — I10 Essential (primary) hypertension: Secondary | ICD-10-CM | POA: Diagnosis not present

## 2020-06-01 DIAGNOSIS — Z79899 Other long term (current) drug therapy: Secondary | ICD-10-CM

## 2020-06-01 DIAGNOSIS — R630 Anorexia: Secondary | ICD-10-CM

## 2020-06-01 DIAGNOSIS — I2510 Atherosclerotic heart disease of native coronary artery without angina pectoris: Secondary | ICD-10-CM

## 2020-06-01 MED ORDER — CARVEDILOL 3.125 MG PO TABS: 3.1250 mg | ORAL_TABLET | Freq: Two times a day (BID) | ORAL | 6 refills | Status: DC

## 2020-06-01 NOTE — Patient Instructions (Signed)
Medication Instructions:  START CARVEDILOL 3.125MG  TWICE DAILY  *If you need a refill on your cardiac medications before your next appointment, please call your pharmacy*  Lab Work: FASTING LIPID AND LFT IN 6 WEEKS ABOUT  07-13-20 If you have labs (blood work) drawn today and your tests are completely normal, you will receive your results only by:  [place] (if you have MyChart) OR A paper copy in the mail.  If you have any lab test that is abnormal or we need to change your treatment, we will call you to review the results. You may go to any [facility] that is convenient for you however, we do have a lab in our office that is able to assist you. You DO NOT need an appointment for our lab. The lab is open 8:00am and closes at 4:00pm. Lunch 12:45 - 1:45pm.  Special Instructions PLEASE INCREASE PHYSICAL ACTIVITY AS TOLERATED  PLEASE READ AND FOLLOW SALTY 6-ATTACHED  Follow-Up: Your next appointment:  3 month(s)  In Person with Buford Dresser, MD  At [facility], you and your health needs are our priority.  As part of our continuing mission to provide you with exceptional heart care, we have created designated Provider Care Teams.  These Care Teams include your primary Cardiologist (physician) and Advanced Practice Providers (APPs -  Physician Assistants and Nurse Practitioners) who all work together to provide you with the care you need, when you need it.  We recommend signing up for the patient portal called "MyChart".  Sign up information is provided on this After Visit Summary.  MyChart is used to connect with patients for Virtual Visits (Telemedicine).  Patients are able to view lab/test results, encounter notes, upcoming appointments, etc.  Non-urgent messages can be sent to your provider as well.   To learn more about what you can do with MyChart, go to [email or web address].

## 2020-06-05 DIAGNOSIS — I110 Hypertensive heart disease with heart failure: Secondary | ICD-10-CM | POA: Diagnosis not present

## 2020-06-05 DIAGNOSIS — G35 Multiple sclerosis: Secondary | ICD-10-CM | POA: Diagnosis not present

## 2020-06-05 DIAGNOSIS — G40909 Epilepsy, unspecified, not intractable, without status epilepticus: Secondary | ICD-10-CM | POA: Diagnosis not present

## 2020-06-05 DIAGNOSIS — I2510 Atherosclerotic heart disease of native coronary artery without angina pectoris: Secondary | ICD-10-CM | POA: Diagnosis not present

## 2020-06-05 DIAGNOSIS — I5020 Unspecified systolic (congestive) heart failure: Secondary | ICD-10-CM | POA: Diagnosis not present

## 2020-06-05 DIAGNOSIS — J449 Chronic obstructive pulmonary disease, unspecified: Secondary | ICD-10-CM | POA: Diagnosis not present

## 2020-06-09 ENCOUNTER — Other Ambulatory Visit: Payer: Self-pay | Admitting: Primary Care

## 2020-06-09 DIAGNOSIS — J189 Pneumonia, unspecified organism: Secondary | ICD-10-CM

## 2020-06-12 DIAGNOSIS — G40909 Epilepsy, unspecified, not intractable, without status epilepticus: Secondary | ICD-10-CM | POA: Diagnosis not present

## 2020-06-12 DIAGNOSIS — I110 Hypertensive heart disease with heart failure: Secondary | ICD-10-CM | POA: Diagnosis not present

## 2020-06-12 DIAGNOSIS — G35 Multiple sclerosis: Secondary | ICD-10-CM | POA: Diagnosis not present

## 2020-06-12 DIAGNOSIS — J449 Chronic obstructive pulmonary disease, unspecified: Secondary | ICD-10-CM | POA: Diagnosis not present

## 2020-06-12 DIAGNOSIS — I5020 Unspecified systolic (congestive) heart failure: Secondary | ICD-10-CM | POA: Diagnosis not present

## 2020-06-12 DIAGNOSIS — I2510 Atherosclerotic heart disease of native coronary artery without angina pectoris: Secondary | ICD-10-CM | POA: Diagnosis not present

## 2020-06-12 NOTE — Telephone Encounter (Signed)
Please notify patient that we received refill requests for her nebulizer medications. Does she feel like she needs refills of the nebulizer medications? I don't believe she managed on them prior to her hospital stay?

## 2020-06-12 NOTE — Telephone Encounter (Signed)
Last prescribed by doctor at the hospital on 03/28/2020 Last OV [facility] follow up ) with Allie Bossier on 05/03/2020 No future OV scheduled

## 2020-06-12 NOTE — Telephone Encounter (Signed)
Spoken to patient's daughter and they have been using these Rx as needed. Patient is using it only 1 or 2 times a day. They continue this because was they were told that they should due to the fluid in the lungs. When patient saw cardio last week, the cardiologist stated that there may be still some fluid in the lungs so continue this or follow up with pcp. Patient's daughter stated that should they keep on with this or should patient make an appointment and double check the lung.

## 2020-06-13 ENCOUNTER — Telehealth: Payer: Self-pay | Admitting: *Deleted

## 2020-06-13 DIAGNOSIS — I5020 Unspecified systolic (congestive) heart failure: Secondary | ICD-10-CM | POA: Diagnosis not present

## 2020-06-13 DIAGNOSIS — G35 Multiple sclerosis: Secondary | ICD-10-CM | POA: Diagnosis not present

## 2020-06-13 DIAGNOSIS — J449 Chronic obstructive pulmonary disease, unspecified: Secondary | ICD-10-CM | POA: Diagnosis not present

## 2020-06-13 DIAGNOSIS — G40909 Epilepsy, unspecified, not intractable, without status epilepticus: Secondary | ICD-10-CM | POA: Diagnosis not present

## 2020-06-13 DIAGNOSIS — I110 Hypertensive heart disease with heart failure: Secondary | ICD-10-CM | POA: Diagnosis not present

## 2020-06-13 DIAGNOSIS — I2510 Atherosclerotic heart disease of native coronary artery without angina pectoris: Secondary | ICD-10-CM | POA: Diagnosis not present

## 2020-06-13 NOTE — Telephone Encounter (Signed)
Thanks. We can certainly set up another chest x-ray to evaluate for any residual fluid within the lungs. If she does not feel as though she needs the nebulizers, then okay to stop. I will set up a chest x-ray, just have them come by.

## 2020-06-13 NOTE — Telephone Encounter (Signed)
Noted, glad to hear. 

## 2020-06-13 NOTE — Telephone Encounter (Signed)
Mickel Baas PT with [facility] called stating that patient is being discharged from their services. Mickel Baas stated that patient has met her goals and is getting around good at home.

## 2020-06-15 ENCOUNTER — Ambulatory Visit (INDEPENDENT_AMBULATORY_CARE_PROVIDER_SITE_OTHER)
Admission: RE | Admit: 2020-06-15 | Discharge: 2020-06-15 | Disposition: A | Discharge status: Home / Self Care | Payer: Medicare Other | Source: Ambulatory Visit | Attending: Primary Care | Admitting: Primary Care

## 2020-06-15 DIAGNOSIS — J189 Pneumonia, unspecified organism: Secondary | ICD-10-CM

## 2020-06-15 NOTE — Telephone Encounter (Signed)
Spoken to [place] on 06/14/2020 and notified of Tawni Millers comments

## 2020-06-19 DIAGNOSIS — I2510 Atherosclerotic heart disease of native coronary artery without angina pectoris: Secondary | ICD-10-CM | POA: Diagnosis not present

## 2020-06-19 DIAGNOSIS — G35 Multiple sclerosis: Secondary | ICD-10-CM | POA: Diagnosis not present

## 2020-06-19 DIAGNOSIS — I5020 Unspecified systolic (congestive) heart failure: Secondary | ICD-10-CM | POA: Diagnosis not present

## 2020-06-19 DIAGNOSIS — J449 Chronic obstructive pulmonary disease, unspecified: Secondary | ICD-10-CM | POA: Diagnosis not present

## 2020-06-19 DIAGNOSIS — I110 Hypertensive heart disease with heart failure: Secondary | ICD-10-CM | POA: Diagnosis not present

## 2020-06-19 DIAGNOSIS — G40909 Epilepsy, unspecified, not intractable, without status epilepticus: Secondary | ICD-10-CM | POA: Diagnosis not present

## 2020-06-22 ENCOUNTER — Encounter: Payer: Self-pay | Admitting: Primary Care

## 2020-06-22 ENCOUNTER — Ambulatory Visit (INDEPENDENT_AMBULATORY_CARE_PROVIDER_SITE_OTHER): Payer: Medicare Other | Admitting: Primary Care

## 2020-06-22 VITALS — BP 124/80 | HR 82 | Temp 97.2°F | Ht 63.0 in | Wt 126.8 lb

## 2020-06-22 DIAGNOSIS — I2510 Atherosclerotic heart disease of native coronary artery without angina pectoris: Secondary | ICD-10-CM

## 2020-06-22 DIAGNOSIS — J439 Emphysema, unspecified: Secondary | ICD-10-CM

## 2020-06-22 DIAGNOSIS — I10 Essential (primary) hypertension: Secondary | ICD-10-CM

## 2020-06-22 DIAGNOSIS — J449 Chronic obstructive pulmonary disease, unspecified: Secondary | ICD-10-CM | POA: Diagnosis not present

## 2020-06-22 DIAGNOSIS — I110 Hypertensive heart disease with heart failure: Secondary | ICD-10-CM | POA: Diagnosis not present

## 2020-06-22 DIAGNOSIS — J189 Pneumonia, unspecified organism: Secondary | ICD-10-CM | POA: Diagnosis not present

## 2020-06-22 DIAGNOSIS — G35 Multiple sclerosis: Secondary | ICD-10-CM | POA: Diagnosis not present

## 2020-06-22 DIAGNOSIS — G40909 Epilepsy, unspecified, not intractable, without status epilepticus: Secondary | ICD-10-CM | POA: Diagnosis not present

## 2020-06-22 DIAGNOSIS — I5020 Unspecified systolic (congestive) heart failure: Secondary | ICD-10-CM | POA: Diagnosis not present

## 2020-06-22 MED ORDER — LOSARTAN POTASSIUM 25 MG PO TABS: 12.5000 mg | ORAL_TABLET | Freq: Every day | ORAL | 1 refills | Status: DC

## 2020-06-22 NOTE — Assessment & Plan Note (Signed)
50+ pack year history of tobacco abuse. Suspect dyspnea is partially secondary to COPD.  Discussed that budesonide is to be used BID, everyday and that albuterol is to be used PRN. She is requesting to try inhalers rather than nebulizers.   Rx for Symbicort and albuterol inhalers sent to pharmacy. Her family will update in one month.

## 2020-06-22 NOTE — Assessment & Plan Note (Signed)
Resolved as of chest xray from July 2021.

## 2020-06-22 NOTE — Progress Notes (Signed)
Subjective:    Patient ID: Anita [name], female    DOB: 1961-02-22, 59 y.o.   MRN: [id number]  HPI  This visit occurred during the SARS-CoV-2 public health emergency.  Safety protocols were in place, including screening questions prior to the visit, additional usage of staff PPE, and extensive cleaning of exam room while observing appropriate contact time as indicated for disinfecting solutions.   Anita [name] is a 59 year old female with a medical history of migraines, hypertension, CAD, NSTEMI, acute respiratory failure, tobacco abuse, pneumonia, pulmonary hypertension who presents today for follow up of shortness of breath.  Admitted to [facility] in April 2021 for atypical pneumonia and acute respiratory failure. She was discharged home from SNF in mid May 2021. She was discharged home on budesonide and levalbulterol nebulized treatments and has been using them since. She called a few days ago for refills, was unsure if she needed to continue. Today she and her family member are here to discuss.   She's been out of budesonide for several weeks, but has been using levalbulterol twice daily for symptoms of shortness of breath that occur with both rest and exertion. She does experience temporary relief with albuterol. Recent chest xray was without pneumonia.   Long history of tobacco abuse for 50 years, quit during her hospital stay in April 2021. She is following with cardiology, next visit due in September.   Review of Systems  Constitutional: Negative for fever.  Respiratory: Positive for shortness of breath. Negative for cough.   Cardiovascular: Negative for chest pain and palpitations.  Neurological: Negative for dizziness.       Past Medical History:  Diagnosis Date  . Abnormality of gait 07/26/2013  . Acute respiratory distress 03/20/2020  . Allergy   . Arthritis   . Chickenpox   . Elevated troponin 03/22/2020  . GERD (gastroesophageal reflux disease)   .  Headache(784.0)    Migraine  . Hearing loss    Right ear secondary to infection  . History of shingles   . Migraines   . Multiple sclerosis ([place])   . Obese   . Optic neuritis   . Prolonged QT interval 03/20/2020     Social History   Socioeconomic History  . Marital status: Divorced    Spouse name: Not on file  . Number of children: Not on file  . Years of education: Not on file  . Highest education level: Not on file  Occupational History  . Not on file  Tobacco Use  . Smoking status: Former Smoker    Packs/day: 1.00    Types: Cigarettes  . Smokeless tobacco: Never Used  Substance and Sexual Activity  . Alcohol use: Yes    Comment: Occasional  . Drug use: Not on file  . Sexual activity: Not on file  Other Topics Concern  . Not on file  Social History Narrative  . Not on file   Social Determinants of Health   Financial Resource Strain:   . Difficulty of Paying Living Expenses:   Food Insecurity:   . Worried About Charity fundraiser in the Last Year:   . Arboriculturist in the Last Year:   Transportation Needs:   . Film/video editor (Medical):   Marland Kitchen Lack of Transportation (Non-Medical):   Physical Activity:   . Days of Exercise per Week:   . Minutes of Exercise per Session:   Stress:   . Feeling of Stress :   Social  Connections:   . Frequency of Communication with Friends and Family:   . Frequency of Social Gatherings with Friends and Family:   . Attends Religious Services:   . Active Member of Clubs or Organizations:   . Attends Archivist Meetings:   Marland Kitchen Marital Status:   Intimate Partner Violence:   . Fear of Current or Ex-Partner:   . Emotionally Abused:   Marland Kitchen Physically Abused:   . Sexually Abused:     Past Surgical History:  Procedure Laterality Date  . Ganglioneuroma     Resection  . LEFT HEART CATH AND CORONARY ANGIOGRAPHY N/A 03/23/2020   Procedure: LEFT HEART CATH AND CORONARY ANGIOGRAPHY;  Surgeon: Leonie Man, MD;   Location: [place] [facility];  Service: Cardiovascular;  Laterality: N/A;  . PILONIDAL CYST EXCISION    . PILONIDAL CYST EXCISION  1983  . TUMOR REMOVAL  2007/2008    Family History  Problem Relation Age of Onset  . Cancer Father   . Cancer Sister   . Heart disease Brother   . Bipolar disorder Sister     Allergies  Allergen Reactions  . Acthar Hp [Corticotropin] Other (See Comments)    Throat swelling and coughing up blood  . Codeine   . Prednisone     Current Outpatient Medications on File Prior to Visit  Medication Sig Dispense Refill  . acetaminophen (TYLENOL) 325 MG tablet Take 2 tablets (650 mg total) by mouth every 6 (six) hours as needed for mild pain (or Fever >/= 101).    Marland Kitchen aspirin EC 81 MG EC tablet Take 1 tablet (81 mg total) by mouth daily.    . budesonide (PULMICORT) 0.25 MG/2ML nebulizer solution Take 4 mLs (0.5 mg total) by nebulization 2 (two) times daily.    . calcium carbonate (TUMS EX) 750 MG chewable tablet Chew 1 tablet by mouth daily as needed for heartburn.    . carvedilol (COREG) 3.125 MG tablet Take 1 tablet (3.125 mg total) by mouth 2 (two) times daily with a meal. 60 tablet 6  . cetirizine (ZYRTEC) 10 MG tablet Take 10 mg by mouth daily.    . Doxylamine Succinate, Sleep, (SLEEP AID PO) Take 1 tablet by mouth at bedtime as needed (sleep).     . feeding supplement, ENSURE ENLIVE, (ENSURE ENLIVE) LIQD Take 237 mLs by mouth 2 (two) times daily between meals.    Marland Kitchen guaiFENesin (MUCINEX) 600 MG 12 hr tablet Take 600 mg by mouth 2 (two) times daily as needed for cough.    . levalbuterol (XOPENEX) 0.63 MG/3ML nebulizer solution Take 3 mLs (0.63 mg total) by nebulization every 6 (six) hours as needed for wheezing or shortness of breath.    . mirtazapine (REMERON) 7.5 MG tablet TAKE 1 TABLET (7.5 MG TOTAL) BY MOUTH AT BEDTIME. FOR SLEEP AND APPETITE. 30 tablet 0  . Multiple Vitamin (MULTIVITAMIN WITH MINERALS) TABS tablet Take 1 tablet by mouth daily.    .  rosuvastatin (CRESTOR) 20 MG tablet Take 1 tablet (20 mg total) by mouth daily. For cholesterol. 90 tablet 3  . SUMAtriptan (IMITREX) 25 MG tablet Take 1 tablet by mouth at migraine onset, may repeat in 2 hours if headache persists or recurs. Do not exceed 2 tablets in 24 hours. 10 tablet 0   No current facility-administered medications on file prior to visit.    BP 124/80   Pulse 82   Temp (!) 97.2 F (36.2 C) (Temporal)   Ht 5\' 3"  (1.6  m)   Wt 126 lb 12 oz (57.5 kg)   SpO2 95%   BMI 22.45 kg/m    Objective:   Physical Exam Cardiovascular:     Rate and Rhythm: Normal rate and regular rhythm.  Pulmonary:     Breath sounds: No decreased breath sounds, wheezing or rales.  Skin:    General: Skin is warm and dry.     Comments: Appeared slightly yellow to upper extremities.  Neurological:     Mental Status: She is alert.  Psychiatric:        Mood and Affect: Mood normal.            Assessment & Plan:

## 2020-06-22 NOTE — Assessment & Plan Note (Signed)
Stable in the office today. Checking CMP due to yellow appearance of skin.

## 2020-06-22 NOTE — Patient Instructions (Signed)
Start Symbicort inhaler. Inhale 2 puffs into the lungs twice daily, everyday.  Shortness of Breath/Wheezing/Cough: Use the albuterol inhaler. Inhale 2 puffs into the lungs every 4 to 6 hours as needed for wheezing, cough, and/or shortness of breath.   Please update me in one month.   It was a pleasure to see you today!

## 2020-06-23 LAB — COMPREHENSIVE METABOLIC PANEL
AG Ratio: 1.5 (calc) (ref 1.0–2.5)
ALT: 6 U/L (ref 6–29)
AST: 12 U/L (ref 10–35)
Albumin: 3.8 g/dL (ref 3.6–5.1)
Alkaline phosphatase (APISO): 42 U/L (ref 37–153)
BUN/Creatinine Ratio: 13 (calc) (ref 6–22)
BUN: 17 mg/dL (ref 7–25)
CO2: 18 mmol/L — ABNORMAL LOW (ref 20–32)
Calcium: 9.1 mg/dL (ref 8.6–10.4)
Chloride: 108 mmol/L (ref 98–110)
Creat: 1.26 mg/dL — ABNORMAL HIGH (ref 0.50–1.05)
Globulin: 2.6 g/dL (calc) (ref 1.9–3.7)
Glucose, Bld: 84 mg/dL (ref 65–99)
Potassium: 4 mmol/L (ref 3.5–5.3)
Sodium: 139 mmol/L (ref 135–146)
Total Bilirubin: 0.5 mg/dL (ref 0.2–1.2)
Total Protein: 6.4 g/dL (ref 6.1–8.1)

## 2020-06-26 ENCOUNTER — Other Ambulatory Visit: Payer: Self-pay | Admitting: Primary Care

## 2020-06-26 DIAGNOSIS — G4700 Insomnia, unspecified: Secondary | ICD-10-CM

## 2020-06-26 DIAGNOSIS — R630 Anorexia: Secondary | ICD-10-CM

## 2020-07-02 ENCOUNTER — Other Ambulatory Visit: Payer: Self-pay

## 2020-07-02 ENCOUNTER — Ambulatory Visit (INDEPENDENT_AMBULATORY_CARE_PROVIDER_SITE_OTHER): Payer: Medicare Other | Admitting: Gastroenterology

## 2020-07-02 VITALS — BP 135/83 | HR 96 | Temp 98.2°F | Ht 63.0 in | Wt 130.0 lb

## 2020-07-02 DIAGNOSIS — E538 Deficiency of other specified B group vitamins: Secondary | ICD-10-CM | POA: Diagnosis not present

## 2020-07-02 DIAGNOSIS — K625 Hemorrhage of anus and rectum: Secondary | ICD-10-CM

## 2020-07-02 DIAGNOSIS — I2510 Atherosclerotic heart disease of native coronary artery without angina pectoris: Secondary | ICD-10-CM | POA: Diagnosis not present

## 2020-07-02 DIAGNOSIS — D649 Anemia, unspecified: Secondary | ICD-10-CM | POA: Diagnosis not present

## 2020-07-02 MED ORDER — NA SULFATE-K SULFATE-MG SULF 17.5-3.13-1.6 GM/177ML PO SOLN: 1.0000 | Freq: Once | ORAL | 0 refills | Status: AC

## 2020-07-02 NOTE — Addendum Note (Signed)
Addended by: Dorethea Clan on: 07/02/2020 03:42 PM   Modules accepted: Orders

## 2020-07-02 NOTE — Addendum Note (Signed)
Addended by: Dorethea Clan on: 07/02/2020 03:41 PM   Modules accepted: Orders

## 2020-07-02 NOTE — Progress Notes (Signed)
[name] MD, MRCP(U.K) [address]  [place]  [place], [place] [address]  Main: [phone]  Fax: [phone]   Gastroenterology Consultation  Referring Provider:     [name], NP Primary Care Physician:  [name], NP Primary Gastroenterologist:  Dr. [name]  Reason for Consultation:   Normocytic anemia and rectal bleeding        HPI:   Anita [name] is a 59 y.o. y/o female referred for consultation & management  By  [name], NP.   In April 2021 admitted with atypical pneumonia and acute kidney injury.  She also has a history of multiple sclerosis. Seen and evaluated by cardiology.  Status post cardiac catheterization.  RCA 100% stenosed.  05/03/2020: Hemoglobin 8.9 g MCV of 82.9.  Hemoglobin was 9.2 g 3 months prior.  MCV at that time was 94.8.  Creatinine of 1.51 elevated AST of 108 ALT of 208.  06/22/2020.  Transaminases have normalized.  Kidney functions have also improved with creatinine of 1.26.  X-ray of the chest on 06/16/2020 shows no acute abnormality. She is here today with her daughter.  Denies any overt blood loss.  Denies any NSAID use.  No prior colonoscopy.  Father had colon polyps.  Denies any rectal bleeding.  She does have urinary and bowel incontinence.  She came in on a wheelchair.  She does have multiple sclerosis.  She said that she had a few episodes of rectal bleeding while in hospital but has not had any since discharge which was a few months back.  Past Medical History:  Diagnosis Date  . Abnormality of gait 07/26/2013  . Acute respiratory distress 03/20/2020  . Acute respiratory failure with hypoxia ([facility]) 03/20/2020  . Allergy   . Arthritis   . Atypical pneumonia 03/22/2020  . Chickenpox   . Elevated troponin 03/22/2020  . GERD (gastroesophageal reflux disease)   . Headache(784.0)    Migraine  . Hearing loss    Right ear secondary to infection  . History of shingles   . Migraines   . Multiple  sclerosis ([facility])   . Obese   . Optic neuritis   . Prolonged QT interval 03/20/2020    Past Surgical History:  Procedure Laterality Date  . Ganglioneuroma     Resection  . LEFT HEART CATH AND CORONARY ANGIOGRAPHY N/A 03/23/2020   Procedure: LEFT HEART CATH AND CORONARY ANGIOGRAPHY;  Surgeon: Leonie Man, MD;  Location: [facility];  Service: Cardiovascular;  Laterality: N/A;  . PILONIDAL CYST EXCISION    . PILONIDAL CYST EXCISION  1983  . TUMOR REMOVAL  2007/2008    Prior to Admission medications   Medication Sig Start Date End Date Taking? Authorizing Provider  acetaminophen (TYLENOL) 325 MG tablet Take 2 tablets (650 mg total) by mouth every 6 (six) hours as needed for mild pain (or Fever >/= 101). 03/28/20   Barton Dubois, MD  albuterol (VENTOLIN HFA) 108 (90 Base) MCG/ACT inhaler Inhale 2 puffs into the lungs every 6 (six) hours as needed.    [provider]  aspirin EC 81 MG EC tablet Take 1 tablet (81 mg total) by mouth daily. 03/29/20   Barton Dubois, MD  budesonide-formoterol [facility]) 160-4.5 MCG/ACT inhaler Inhale 2 puffs into the lungs 2 (two) times daily.    [provider]  calcium carbonate (TUMS EX) 750 MG chewable tablet Chew 1 tablet by mouth daily as needed for heartburn.    [provider]  carvedilol (COREG) 3.125 MG tablet Take 1 tablet (3.125 mg total) by mouth 2 (two) times daily with a meal. 06/01/20   Cleaver, Jossie Ng, NP  cetirizine (ZYRTEC) 10 MG tablet Take 10 mg by mouth daily.    [provider]  Doxylamine Succinate, Sleep, (SLEEP AID PO) Take 1 tablet by mouth at bedtime as needed (sleep).     [provider]  feeding supplement, ENSURE ENLIVE, (ENSURE ENLIVE) LIQD Take 237 mLs by mouth 2 (two) times daily between meals. 03/28/20   Barton Dubois, MD  guaiFENesin (MUCINEX) 600 MG 12 hr tablet Take 600 mg by mouth 2 (two) times daily as needed for cough.    [provider]  losartan (COZAAR) 25  MG tablet Take 0.5 tablets (12.5 mg total) by mouth daily. 06/22/20   [name], NP  mirtazapine (REMERON) 7.5 MG tablet TAKE 1 TABLET (7.5 MG TOTAL) BY MOUTH AT BEDTIME. FOR SLEEP AND APPETITE. 06/26/20   [name], NP  Multiple Vitamin (MULTIVITAMIN WITH MINERALS) TABS tablet Take 1 tablet by mouth daily. 03/29/20   Barton Dubois, MD  rosuvastatin (CRESTOR) 20 MG tablet Take 1 tablet (20 mg total) by mouth daily. For cholesterol. 05/18/20   [name], NP  SUMAtriptan (IMITREX) 25 MG tablet Take 1 tablet by mouth at migraine onset, may repeat in 2 hours if headache persists or recurs. Do not exceed 2 tablets in 24 hours. 02/15/18   [name], NP    Family History  Problem Relation Age of Onset  . Cancer Father   . Cancer Sister   . Heart disease Brother   . Bipolar disorder Sister      Social History   Tobacco Use  . Smoking status: Former Smoker    Packs/day: 1.00    Types: Cigarettes  . Smokeless tobacco: Never Used  Substance Use Topics  . Alcohol use: Yes    Comment: Occasional  . Drug use: Not on file    Allergies as of 07/02/2020 - Review Complete 06/22/2020  Allergen Reaction Noted  . Acthar hp [corticotropin] Other (See Comments) 06/28/2013  . Codeine    . Prednisone      Review of Systems:    All systems reviewed and negative except where noted in HPI.   Physical Exam:  There were no vitals taken for this visit. No LMP recorded. Patient is postmenopausal. Psych:  Alert and cooperative. Normal mood and affect. General:   Alert,  Well-developed, well-nourished, pleasant and cooperative in NAD Head:  Normocephalic and atraumatic. Eyes:  Sclera clear, no icterus.   Conjunctiva pink. Ears:  Normal auditory acuity. Lungs:  Respirations even and unlabored.  Clear throughout to auscultation.   No wheezes, crackles, or rhonchi. No acute distress. Heart:  Regular rate and rhythm; no murmurs, clicks, rubs, or gallops. Abdomen:  Normal  bowel sounds.  No bruits.  Soft, non-tender and non-distended without masses, hepatosplenomegaly or hernias noted.  No guarding or rebound tenderness.    Neurologic:  Alert and oriented x3; Psych:  Alert and cooperative. Normal mood and affect.  Imaging Studies: DG Chest 2 View  Result Date: 06/16/2020 CLINICAL DATA:  History of atypical pneumonia EXAM: CHEST - 2 VIEW COMPARISON:  05/03/2020 FINDINGS: Frontal and lateral views of the chest demonstrate an unremarkable cardiac silhouette. Resolution of the interstitial prominence seen previously. No airspace disease, effusion, or pneumothorax. No acute bony abnormalities. IMPRESSION: 1. No acute intrathoracic process. Electronically Signed   By: Legrand Como  Owens Shark M.D.   On: 06/16/2020 23:58    Assessment and Plan:   Anita [name] is a 59 y.o. y/o female has been referred for normocytic anemia and rectal bleeding.  Review of labs suggest that she has anemia although normocytic the MCV is 82 which is almost bordering a microcytic anemia.  She also has CKD.  I suspect she has an element of iron deficiency anemia with the background of anemia of chronic disease.  Unclear of the etiology of the iron deficiency anemia.  I will obtain labs to confirm the same.  Plan 1.  Check iron studies, B12, folate, H. pylori breath test, celiac serology, urine analysis. 2.  If has evidence of iron deficiency will need EGD, colonoscopy and possibly capsule study of the small bowel.  If there is no evidence of iron deficiency then will need a colonoscopy to evaluate for rectal bleeding and may require hematology referral to evaluate for anemia with normal iron studies.  Since it is to be acute would warrant a referral.   I have discussed alternative options, risks & benefits,  which include, but are not limited to, bleeding, infection, perforation,respiratory complication & drug reaction.  The patient agrees with this plan & written consent will be obtained.      Follow up in 5 weeks telephone visit  Dr [name] MD,MRCP(U.K)

## 2020-07-04 ENCOUNTER — Telehealth: Payer: Self-pay

## 2020-07-04 NOTE — Telephone Encounter (Signed)
-----   Message from [name], MD sent at 07/03/2020 11:08 PM EDT ----- Inform iron studies are normal; can you check why b12 not yet obtained- was ordered ysterday

## 2020-07-04 NOTE — Telephone Encounter (Signed)
Called and left a message for call back  

## 2020-07-05 LAB — URINALYSIS
Bilirubin, UA: NEGATIVE
Glucose, UA: NEGATIVE
Ketones, UA: NEGATIVE
Leukocytes,UA: NEGATIVE
Nitrite, UA: NEGATIVE
Protein,UA: NEGATIVE
RBC, UA: NEGATIVE
Specific Gravity, UA: 1.008 (ref 1.005–1.030)
Urobilinogen, Ur: 0.2 mg/dL (ref 0.2–1.0)
pH, UA: 6.5 (ref 5.0–7.5)

## 2020-07-05 LAB — IRON,TIBC AND FERRITIN PANEL
Ferritin: 91 ng/mL (ref 15–150)
Iron Saturation: 11 % — ABNORMAL LOW (ref 15–55)
Iron: 34 ug/dL (ref 27–159)
Total Iron Binding Capacity: 307 ug/dL (ref 250–450)
UIBC: 273 ug/dL (ref 131–425)

## 2020-07-05 LAB — VITAMIN C: Vitamin C: 1 mg/dL (ref 0.4–2.0)

## 2020-07-05 LAB — CELIAC DISEASE AB SCREEN W/RFX
Antigliadin Abs, IgA: 3 units (ref 0–19)
IgA/Immunoglobulin A, Serum: 180 mg/dL (ref 87–352)
Transglutaminase IgA: <2 U/mL (ref 0–3)

## 2020-07-09 ENCOUNTER — Telehealth: Payer: Self-pay

## 2020-07-09 NOTE — Telephone Encounter (Signed)
° °  [facility] Pre-operative Risk Assessment    HEARTCARE STAFF: - Please ensure there is not already an duplicate clearance open for this procedure. - Under Visit Info/Reason for Call, type in Other and utilize the format Clearance MM/DD/YY or Clearance TBD. Do not use dashes or single digits. - If request is for dental extraction, please clarify the # of teeth to be extracted.  Request for surgical clearance:  1. What type of surgery is being performed? Colonoscopy   2. When is this surgery scheduled? 08/03/20   3. What type of clearance is required (medical clearance vs. Pharmacy clearance to hold med vs. Both)? Medical  4. Are there any medications that need to be held prior to surgery and how long? None   5. Practice name and name of physician performing surgery? [facility], Dr. [name]   6. What is the office phone number? [phone]   7.   What is the office fax number? [phone]  8.   Anesthesia type (None, local, MAC, general) ? General   Anita [name] 07/09/2020, 11:44 AM  _________________________________________________________________   (provider comments below)

## 2020-07-09 NOTE — Telephone Encounter (Signed)
   Primary Cardiologist: Buford Dresser, MD  Chart reviewed as part of pre-operative protocol coverage. Given past medical history and time since last visit, based on ACC/AHA guidelines, NIKIA LEVELS would be at acceptable risk for the planned procedure without further cardiovascular testing.   I will route this recommendation to the requesting party via Epic fax function and remove from pre-op pool.  Please call with questions.  Loel Dubonnet, NP 07/09/2020, 12:32 PM

## 2020-07-22 NOTE — Telephone Encounter (Signed)
Can we try again with diagnosis of normocytic anemia to check b12 and folate levels?

## 2020-07-25 NOTE — Telephone Encounter (Signed)
Proceed with colonoscopy  Hematology referral to Dr Tasia Catchings for anemia with normal iron studies-acute

## 2020-07-26 ENCOUNTER — Other Ambulatory Visit: Payer: Self-pay | Admitting: Primary Care

## 2020-07-26 DIAGNOSIS — R630 Anorexia: Secondary | ICD-10-CM

## 2020-07-26 DIAGNOSIS — G4700 Insomnia, unspecified: Secondary | ICD-10-CM

## 2020-07-31 ENCOUNTER — Other Ambulatory Visit: Payer: Self-pay

## 2020-07-31 DIAGNOSIS — D649 Anemia, unspecified: Secondary | ICD-10-CM

## 2020-08-01 ENCOUNTER — Other Ambulatory Visit: Payer: Self-pay

## 2020-08-01 ENCOUNTER — Other Ambulatory Visit
Admission: RE | Admit: 2020-08-01 | Discharge: 2020-08-01 | Disposition: A | Discharge status: Home / Self Care | Payer: Medicare Other | Source: Ambulatory Visit | Attending: Gastroenterology | Admitting: Gastroenterology

## 2020-08-01 DIAGNOSIS — Z20822 Contact with and (suspected) exposure to covid-19: Secondary | ICD-10-CM | POA: Diagnosis not present

## 2020-08-01 DIAGNOSIS — Z01812 Encounter for preprocedural laboratory examination: Secondary | ICD-10-CM | POA: Diagnosis not present

## 2020-08-02 LAB — SARS CORONAVIRUS 2 (TAT 6-24 HRS): SARS Coronavirus 2: NEGATIVE

## 2020-08-03 ENCOUNTER — Ambulatory Visit: Payer: Medicare Other | Admitting: Certified Registered"

## 2020-08-03 ENCOUNTER — Encounter
Admission: RE | Disposition: A | Discharge status: Home / Self Care | Payer: Self-pay | Source: Home / Self Care | Attending: Gastroenterology

## 2020-08-03 ENCOUNTER — Ambulatory Visit
Admission: RE | Admit: 2020-08-03 | Discharge: 2020-08-03 | Disposition: A | Discharge status: Home / Self Care | Payer: Medicare Other | Attending: Gastroenterology | Admitting: Gastroenterology

## 2020-08-03 DIAGNOSIS — K219 Gastro-esophageal reflux disease without esophagitis: Secondary | ICD-10-CM | POA: Insufficient documentation

## 2020-08-03 DIAGNOSIS — J449 Chronic obstructive pulmonary disease, unspecified: Secondary | ICD-10-CM | POA: Diagnosis not present

## 2020-08-03 DIAGNOSIS — I110 Hypertensive heart disease with heart failure: Secondary | ICD-10-CM | POA: Diagnosis not present

## 2020-08-03 DIAGNOSIS — I2510 Atherosclerotic heart disease of native coronary artery without angina pectoris: Secondary | ICD-10-CM | POA: Insufficient documentation

## 2020-08-03 DIAGNOSIS — Z87891 Personal history of nicotine dependence: Secondary | ICD-10-CM | POA: Diagnosis not present

## 2020-08-03 DIAGNOSIS — G35 Multiple sclerosis: Secondary | ICD-10-CM | POA: Diagnosis not present

## 2020-08-03 DIAGNOSIS — K625 Hemorrhage of anus and rectum: Secondary | ICD-10-CM

## 2020-08-03 DIAGNOSIS — Z7982 Long term (current) use of aspirin: Secondary | ICD-10-CM | POA: Insufficient documentation

## 2020-08-03 DIAGNOSIS — K5790 Diverticulosis of intestine, part unspecified, without perforation or abscess without bleeding: Secondary | ICD-10-CM | POA: Diagnosis not present

## 2020-08-03 DIAGNOSIS — K640 First degree hemorrhoids: Secondary | ICD-10-CM | POA: Insufficient documentation

## 2020-08-03 DIAGNOSIS — K635 Polyp of colon: Secondary | ICD-10-CM

## 2020-08-03 DIAGNOSIS — D123 Benign neoplasm of transverse colon: Secondary | ICD-10-CM | POA: Diagnosis not present

## 2020-08-03 DIAGNOSIS — K5731 Diverticulosis of large intestine without perforation or abscess with bleeding: Secondary | ICD-10-CM | POA: Diagnosis not present

## 2020-08-03 DIAGNOSIS — I509 Heart failure, unspecified: Secondary | ICD-10-CM | POA: Diagnosis not present

## 2020-08-03 DIAGNOSIS — D649 Anemia, unspecified: Secondary | ICD-10-CM

## 2020-08-03 DIAGNOSIS — H9191 Unspecified hearing loss, right ear: Secondary | ICD-10-CM | POA: Insufficient documentation

## 2020-08-03 DIAGNOSIS — Z79899 Other long term (current) drug therapy: Secondary | ICD-10-CM | POA: Insufficient documentation

## 2020-08-03 DIAGNOSIS — K5730 Diverticulosis of large intestine without perforation or abscess without bleeding: Secondary | ICD-10-CM | POA: Diagnosis not present

## 2020-08-03 DIAGNOSIS — Z791 Long term (current) use of non-steroidal anti-inflammatories (NSAID): Secondary | ICD-10-CM | POA: Diagnosis not present

## 2020-08-03 DIAGNOSIS — I340 Nonrheumatic mitral (valve) insufficiency: Secondary | ICD-10-CM | POA: Diagnosis not present

## 2020-08-03 DIAGNOSIS — G43909 Migraine, unspecified, not intractable, without status migrainosus: Secondary | ICD-10-CM | POA: Diagnosis not present

## 2020-08-03 DIAGNOSIS — I252 Old myocardial infarction: Secondary | ICD-10-CM | POA: Insufficient documentation

## 2020-08-03 DIAGNOSIS — E669 Obesity, unspecified: Secondary | ICD-10-CM | POA: Diagnosis not present

## 2020-08-03 DIAGNOSIS — K649 Unspecified hemorrhoids: Secondary | ICD-10-CM | POA: Diagnosis not present

## 2020-08-03 HISTORY — PX: COLONOSCOPY WITH PROPOFOL: SHX5780

## 2020-08-03 SURGERY — COLONOSCOPY WITH PROPOFOL
Anesthesia: General

## 2020-08-03 MED ORDER — PROPOFOL 10 MG/ML IV BOLUS
INTRAVENOUS | Status: DC | PRN
  Administered 2020-08-03: 20 mg via INTRAVENOUS

## 2020-08-03 MED ORDER — SODIUM CHLORIDE 0.9 % IV SOLN
INTRAVENOUS | Status: DC
  Administered 2020-08-03: 20 mL/h via INTRAVENOUS

## 2020-08-03 MED ORDER — PROPOFOL 10 MG/ML IV BOLUS
INTRAVENOUS | Status: AC
  Filled 2020-08-03: qty 20

## 2020-08-03 MED ORDER — MIDAZOLAM HCL 2 MG/2ML IJ SOLN
INTRAMUSCULAR | Status: DC | PRN
  Administered 2020-08-03 (×2): 1 mg via INTRAVENOUS

## 2020-08-03 MED ORDER — MIDAZOLAM HCL 2 MG/2ML IJ SOLN
INTRAMUSCULAR | Status: AC
  Filled 2020-08-03: qty 2

## 2020-08-03 MED ORDER — PROPOFOL 500 MG/50ML IV EMUL
INTRAVENOUS | Status: DC | PRN
  Administered 2020-08-03: 50 ug/kg/min via INTRAVENOUS

## 2020-08-03 NOTE — Anesthesia Postprocedure Evaluation (Signed)
Anesthesia Post Note  Patient: Anita [name]  Procedure(s) Performed: COLONOSCOPY WITH PROPOFOL (N/A )  Patient location during evaluation: Endoscopy Anesthesia Type: General Level of consciousness: awake and alert Pain management: pain level controlled Vital Signs Assessment: post-procedure vital signs reviewed and stable Respiratory status: spontaneous breathing, nonlabored ventilation, respiratory function stable and patient connected to nasal cannula oxygen Cardiovascular status: blood pressure returned to baseline and stable Postop Assessment: no apparent nausea or vomiting Anesthetic complications: no   No complications documented.   Last Vitals:  Vitals:   08/03/20 0900 08/03/20 0910  BP: 131/88 126/85  Pulse: 88 87  Resp: 20 20  Temp: (!) 35.7 C   SpO2: 95% 98%    Last Pain:  Vitals:   08/03/20 0900  TempSrc: Temporal  PainSc:                  Arita Miss

## 2020-08-03 NOTE — Anesthesia Preprocedure Evaluation (Signed)
Anesthesia Evaluation  Patient identified by MRN, date of birth, ID band Patient awake    Reviewed: Allergy & Precautions, NPO status , Patient's Chart, lab work & pertinent test results  History of Anesthesia Complications Negative for: history of anesthetic complications  Airway Mallampati: II  TM Distance: >3 FB Neck ROM: Full    Dental  (+) Edentulous Upper, Edentulous Lower   Pulmonary shortness of breath and at rest, neg sleep apnea, COPD,  COPD inhaler, Patient abstained from smoking.Not current smoker, former smoker,    Pulmonary exam normal breath sounds clear to auscultation       Cardiovascular Exercise Tolerance: Poor METS: < 3 Mets hypertension, + CAD, + Past MI, +CHF and + DOE  (-) dysrhythmias + Valvular Problems/Murmurs MR  Rhythm:Regular Rate:Tachycardia - Systolic murmurs LHC 0350: " MODERATE-SEVERE THREE-VESSEL CAD: ? 100% proximal RCA occlusion with left-to-right collaterals faintly filling PDA and PL system (AV groove LCx-PL and LAD septal-PDA) ? Diffuse moderate mid LAD disease with 60% to 50% stenosis. ? Tandem 50% and 65% proximal and mid LCx with the 65% lesion being the most significant lesion.  Moderately elevated LVEDP of 18-20 mmHg  Given the extent of disease in the LAD and LCx, neither lesion is very amenable to PCI, and RCA is chronically occluded.  Given her comorbidities, I do not think she would be a good CABG candidate especially in light of the fact that she would likely require valve surgery as well.  She would not recover well.  Recommendations aggressive medical management. "  TTE 2021: Left ventricular ejection fraction, by estimation, is 40 to 45%. The  left ventricle has mildly decreased function. The left ventricle  demonstrates global hypokinesis. The left ventricular internal cavity size  was mildly dilated. Left ventricular  diastolic function could not be evaluated.  2.  Right ventricular systolic function is normal. The right ventricular  size is normal. There is moderately elevated pulmonary artery systolic  pressure.  3. Left atrial size was mildly dilated.  4. Moderate mitral subvalvular thickening/fibrosis.  5. The mitral valve is rheumatic. Moderate to severe mitral valve  regurgitation. Mild mitral stenosis. The mean mitral valve gradient is 7.3  mmHg with average heart rate of 105 bpm.  6. The aortic valve is normal in structure. Aortic valve regurgitation is  not visualized.  7. The inferior vena cava is dilated in size with <50% respiratory  variability, suggesting right atrial pressure of 15 mmHg.    Neuro/Psych  Headaches, negative psych ROS   GI/Hepatic GERD  ,(+)     (-) substance abuse  ,   Endo/Other  neg diabetes  Renal/GU negative Renal ROS     Musculoskeletal   Abdominal   Peds  Hematology  (+) anemia ,   Anesthesia Other Findings Past Medical History: 07/26/2013: Abnormality of gait 03/20/2020: Acute respiratory distress 03/20/2020: Acute respiratory failure with hypoxia ([facility]) No date: Allergy No date: Arthritis 03/22/2020: Atypical pneumonia No date: Chickenpox 03/22/2020: Elevated troponin No date: GERD (gastroesophageal reflux disease) No date: Headache(784.0)     Comment:  Migraine No date: Hearing loss     Comment:  Right ear secondary to infection No date: History of shingles No date: Migraines No date: Multiple sclerosis ([facility]) No date: Obese No date: Optic neuritis 03/20/2020: Prolonged QT interval  Reproductive/Obstetrics                             Anesthesia Physical Anesthesia Plan  ASA: IV  Anesthesia Plan: General   Post-op Pain Management:    Induction: Intravenous  PONV Risk Score and Plan: 3 and Ondansetron, TIVA and Midazolam  Airway Management Planned: Nasal Cannula and Natural Airway  Additional Equipment: None  Intra-op Plan:   Post-operative  Plan:   Informed Consent: I have reviewed the patients History and Physical, chart, labs and discussed the procedure including the risks, benefits and alternatives for the proposed anesthesia with the patient or authorized representative who has indicated his/her understanding and acceptance.     Dental advisory given  Plan Discussed with: CRNA and Surgeon  Anesthesia Plan Comments: (Discussed risks of anesthesia with patient, including possibility of difficulty with spontaneous ventilation under anesthesia necessitating airway intervention, PONV, and rare risks such as cardiac or respiratory or neurological events. Patient understands. Patient counseled on being higher risk for anesthesia due to comorbidities: inoperable severe CAD and MR. Patient was told about increased risk of cardiac and respiratory events, including death. Patient understands. )        Anesthesia Quick Evaluation

## 2020-08-03 NOTE — H&P (Signed)
[name], MD [address] [place], [place], [place], [address] [address], [place], [place], [place], [address] Phone: [phone]  Fax: [phone]  Primary Care Physician:  [name], NP   Pre-Procedure History & Physical: HPI:  Anita [name] is a 59 y.o. female is here for an colonoscopy.   Past Medical History:  Diagnosis Date  . Abnormality of gait 07/26/2013  . Acute respiratory distress 03/20/2020  . Acute respiratory failure with hypoxia ([facility]) 03/20/2020  . Allergy   . Arthritis   . Atypical pneumonia 03/22/2020  . Chickenpox   . Elevated troponin 03/22/2020  . GERD (gastroesophageal reflux disease)   . Headache(784.0)    Migraine  . Hearing loss    Right ear secondary to infection  . History of shingles   . Migraines   . Multiple sclerosis ([facility])   . Obese   . Optic neuritis   . Prolonged QT interval 03/20/2020    Past Surgical History:  Procedure Laterality Date  . Ganglioneuroma     Resection  . LEFT HEART CATH AND CORONARY ANGIOGRAPHY N/A 03/23/2020   Procedure: LEFT HEART CATH AND CORONARY ANGIOGRAPHY;  Surgeon: Leonie Man, MD;  Location: [facility];  Service: Cardiovascular;  Laterality: N/A;  . PILONIDAL CYST EXCISION    . PILONIDAL CYST EXCISION  1983  . TUMOR REMOVAL  2007/2008    Prior to Admission medications   Medication Sig Start Date End Date Taking? Authorizing Provider  acetaminophen (TYLENOL) 325 MG tablet Take 2 tablets (650 mg total) by mouth every 6 (six) hours as needed for mild pain (or Fever >/= 101). 03/28/20  Yes Barton Dubois, MD  albuterol (VENTOLIN HFA) 108 (90 Base) MCG/ACT inhaler Inhale 2 puffs into the lungs every 6 (six) hours as needed.   Yes [provider]  aspirin EC 81 MG EC tablet Take 1 tablet (81 mg total) by mouth daily. 03/29/20  Yes Barton Dubois, MD  budesonide-formoterol [facility]) 160-4.5 MCG/ACT inhaler Inhale 2 puffs into the lungs 2 (two) times daily.   Yes  [provider]  calcium carbonate (TUMS EX) 750 MG chewable tablet Chew 1 tablet by mouth daily as needed for heartburn.   Yes [provider]  carvedilol (COREG) 3.125 MG tablet Take 1 tablet (3.125 mg total) by mouth 2 (two) times daily with a meal. 06/01/20  Yes Cleaver, Jossie Ng, NP  cetirizine (ZYRTEC) 10 MG tablet Take 10 mg by mouth daily.   Yes [provider]  Doxylamine Succinate, Sleep, (SLEEP AID PO) Take 1 tablet by mouth at bedtime as needed (sleep).    Yes [provider]  feeding supplement, ENSURE ENLIVE, (ENSURE ENLIVE) LIQD Take 237 mLs by mouth 2 (two) times daily between meals. 03/28/20  Yes Barton Dubois, MD  guaiFENesin (MUCINEX) 600 MG 12 hr tablet Take 600 mg by mouth 2 (two) times daily as needed for cough.   Yes [provider]  losartan (COZAAR) 25 MG tablet Take 0.5 tablets (12.5 mg total) by mouth daily. 06/22/20  Yes [name], NP  mirtazapine (REMERON) 7.5 MG tablet TAKE 1 TABLET (7.5 MG TOTAL) BY MOUTH AT BEDTIME. FOR SLEEP AND APPETITE. 07/27/20  Yes [name], NP  Multiple Vitamin (MULTIVITAMIN WITH MINERALS) TABS tablet Take 1 tablet by mouth daily. 03/29/20  Yes Barton Dubois, MD  rosuvastatin (CRESTOR) 20 MG tablet Take 1 tablet (20 mg total) by mouth daily. For cholesterol. 05/18/20  Yes  [name], NP  SUMAtriptan (IMITREX) 25 MG tablet Take 1 tablet by mouth at migraine onset, may repeat in 2 hours if headache persists or recurs. Do not exceed 2 tablets in 24 hours. 02/15/18  Yes [name], NP    Allergies as of 07/03/2020 - Review Complete 07/02/2020  Allergen Reaction Noted  . Acthar hp [corticotropin] Other (See Comments) 06/28/2013  . Codeine    . Prednisone      Family History  Problem Relation Age of Onset  . Cancer Father   . Cancer Sister   . Heart disease Brother   . Bipolar disorder Sister     Social History   Socioeconomic History  . Marital status: Divorced      Spouse name: Not on file  . Number of children: Not on file  . Years of education: Not on file  . Highest education level: Not on file  Occupational History  . Not on file  Tobacco Use  . Smoking status: Former Smoker    Packs/day: 1.00    Types: Cigarettes  . Smokeless tobacco: Never Used  Substance and Sexual Activity  . Alcohol use: Yes    Comment: Occasional  . Drug use: Not on file  . Sexual activity: Not on file  Other Topics Concern  . Not on file  Social History Narrative  . Not on file   Social Determinants of Health   Financial Resource Strain:   . Difficulty of Paying Living Expenses: Not on file  Food Insecurity:   . Worried About Charity fundraiser in the Last Year: Not on file  . Ran Out of Food in the Last Year: Not on file  Transportation Needs:   . Lack of Transportation (Medical): Not on file  . Lack of Transportation (Non-Medical): Not on file  Physical Activity:   . Days of Exercise per Week: Not on file  . Minutes of Exercise per Session: Not on file  Stress:   . Feeling of Stress : Not on file  Social Connections:   . Frequency of Communication with Friends and Family: Not on file  . Frequency of Social Gatherings with Friends and Family: Not on file  . Attends Religious Services: Not on file  . Active Member of Clubs or Organizations: Not on file  . Attends Archivist Meetings: Not on file  . Marital Status: Not on file  Intimate Partner Violence:   . Fear of Current or Ex-Partner: Not on file  . Emotionally Abused: Not on file  . Physically Abused: Not on file  . Sexually Abused: Not on file    Review of Systems: See HPI, otherwise negative ROS  Physical Exam: BP (!) 141/99   Pulse 99   Temp (!) 96.2 F (35.7 C) (Temporal)   Resp 20   Ht 5\' 3"  (1.6 m)   Wt 56.7 kg   SpO2 99%   BMI 22.14 kg/m  General:   Alert,  pleasant and cooperative in NAD Head:  Normocephalic and atraumatic. Neck:  Supple; no masses or  thyromegaly. Lungs:  Clear throughout to auscultation, normal respiratory effort.    Heart:  +S1, +S2, Regular rate and rhythm, No edema. Abdomen:  Soft, nontender and nondistended. Normal bowel sounds, without guarding, and without rebound.   Neurologic:  Alert and  oriented x4;  grossly normal neurologically.  Impression/Plan: Anita [name] is here for an colonoscopy to be performed for  Rectal bleeding. Risks, benefits, limitations, and  alternatives regarding  colonoscopy have been reviewed with the patient.  Questions have been answered.  All parties agreeable.   [name], MD  08/03/2020, 8:32 AM

## 2020-08-03 NOTE — Op Note (Signed)
[facility] Gastroenterology Patient Name: Anita [name] Procedure Date: 08/03/2020 8:44 AM MRN: [id number] Account #: [id number] Date of Birth: 23-Dec-1960 Admit Type: Outpatient Age: 59 Room: [facility] ENDO ROOM 4 Gender: Female Note Status: Finalized Procedure:             Colonoscopy Indications:           Rectal bleeding Providers:             [name] MD, MD Referring MD:          [name] (Referring MD) Medicines:             Monitored Anesthesia Care Complications:         No immediate complications. Procedure:             Pre-Anesthesia Assessment:                        - Prior to the procedure, a History and Physical was                         performed, and patient medications, allergies and                         sensitivities were reviewed. The patient's tolerance                         of previous anesthesia was reviewed.                        - The risks and benefits of the procedure and the                         sedation options and risks were discussed with the                         patient. All questions were answered and informed                         consent was obtained.                        - ASA Grade Assessment: III - A patient with severe                         systemic disease.                        After obtaining informed consent, the colonoscope was                         passed under direct vision. Throughout the procedure,                         the patient's blood pressure, pulse, and oxygen                         saturations were monitored continuously. The                         Colonoscope was introduced through the anus and  advanced to the the cecum, identified by the                         appendiceal orifice. The colonoscopy was performed                         with ease. The patient tolerated the procedure well.                         The quality of the bowel  preparation was adequate. Findings:      The perianal and digital rectal examinations were normal.      A 5 mm polyp was found in the distal transverse colon. The polyp was       sessile. The polyp was removed with a cold snare. Resection and       retrieval were complete.      Multiple small-mouthed diverticula were found in the sigmoid colon.      Non-bleeding internal hemorrhoids were found during retroflexion. The       hemorrhoids were large and Grade I (internal hemorrhoids that do not       prolapse).      The exam was otherwise without abnormality on direct and retroflexion       views. Impression:            - One 5 mm polyp in the distal transverse colon,                         removed with a cold snare. Resected and retrieved.                        - Diverticulosis in the sigmoid colon.                        - Non-bleeding internal hemorrhoids.                        - The examination was otherwise normal on direct and                         retroflexion views. Recommendation:        - Discharge patient to home (with escort).                        - Resume previous diet.                        - Continue present medications.                        - Await pathology results.                        - Repeat colonoscopy for surveillance based on                         pathology results. Procedure Code(s):     --- Professional ---                        [phone], Colonoscopy, flexible; with removal of  tumor(s), polyp(s), or other lesion(s) by snare                         technique Diagnosis Code(s):     --- Professional ---                        K63.5, Polyp of colon                        K64.0, First degree hemorrhoids                        K62.5, Hemorrhage of anus and rectum                        K57.30, Diverticulosis of large intestine without                         perforation or abscess without bleeding CPT copyright 2019 American  Medical Association. All rights reserved. The codes documented in this report are preliminary and upon coder review may  be revised to meet current compliance requirements. [name], MD [name] MD, MD 08/03/2020 9:05:29 AM This report has been signed electronically. Number of Addenda: 0 Note Initiated On: 08/03/2020 8:44 AM Scope Withdrawal Time: 0 hours 7 minutes 11 seconds  Total Procedure Duration: 0 hours 11 minutes 24 seconds  Estimated Blood Loss:  Estimated blood loss: none.      [facility]

## 2020-08-03 NOTE — Transfer of Care (Signed)
Immediate Anesthesia Transfer of Care Note  Patient: JULIANNE CHAMBERLIN  Procedure(s) Performed: COLONOSCOPY WITH PROPOFOL (N/A )  Patient Location: PACU and Endoscopy Unit  Anesthesia Type:General  Level of Consciousness: drowsy  Airway & Oxygen Therapy: Patient Spontanous Breathing and Patient connected to nasal cannula oxygen  Post-op Assessment: Report given to RN and Post -op Vital signs reviewed and stable  Post vital signs: Reviewed and stable  Last Vitals:  Vitals Value Taken Time  BP 131/88 08/03/20 0908  Temp    Pulse 90 08/03/20 0908  Resp 20 08/03/20 0908  SpO2 93 % 08/03/20 0908  Vitals shown include unvalidated device data.  Last Pain:  Vitals:   08/03/20 0900  TempSrc: (P) Temporal  PainSc:          Complications: No complications documented.

## 2020-08-03 NOTE — Anesthesia Procedure Notes (Signed)
Date/Time: 08/03/2020 8:49 AM Performed by: Jerrye Noble, CRNA Pre-anesthesia Checklist: Patient identified, Emergency Drugs available, Suction available and Patient being monitored Oxygen Delivery Method: Nasal cannula

## 2020-08-06 ENCOUNTER — Telehealth (INDEPENDENT_AMBULATORY_CARE_PROVIDER_SITE_OTHER): Payer: Medicare Other | Admitting: Gastroenterology

## 2020-08-06 ENCOUNTER — Encounter: Payer: Self-pay | Admitting: Gastroenterology

## 2020-08-06 DIAGNOSIS — K625 Hemorrhage of anus and rectum: Secondary | ICD-10-CM

## 2020-08-06 DIAGNOSIS — D649 Anemia, unspecified: Secondary | ICD-10-CM

## 2020-08-06 DIAGNOSIS — I2510 Atherosclerotic heart disease of native coronary artery without angina pectoris: Secondary | ICD-10-CM | POA: Diagnosis not present

## 2020-08-06 LAB — SURGICAL PATHOLOGY

## 2020-08-06 NOTE — Progress Notes (Signed)
Anita [name] , MD [address]  [place]  [place], [place] [address]  Main: [phone]  Fax: [phone]   Primary Care Physician: Anita Koch, NP  Virtual Visit via Telephone Note  I connected with patient on 08/06/20 at  2:30 PM EDT by telephone and verified that I am speaking with the correct person using two identifiers.   I discussed the limitations, risks, security and privacy concerns of performing an evaluation and management service by telephone and the availability of in person appointments. I also discussed with the patient that there may be a patient responsible charge related to this service. The patient expressed understanding and agreed to proceed.  Location of Patient: Home Location of Provider: Home Persons involved: Patient and provider only   History of Present Illness: Chief Complaint  Patient presents with  . Follow-up    Anemia    HPI: Anita [name] is a 59 y.o. female   Summary of history : Initially referred and seen in July 2021 for normocytic anemia and rectal bleeding.In April 2021 admitted with atypical pneumonia and acute kidney injury.  She also has a history of multiple sclerosis. Seen and evaluated by cardiology.  Status post cardiac catheterization.  RCA 100% stenosed.  05/03/2020: Hemoglobin 8.9 g MCV of 82.9.  Hemoglobin was 9.2 g 3 months prior.  MCV at that time was 94.8. Creatinine of 1.51 elevated AST of 108 ALT of 208.  06/22/2020.  Transaminases have normalized.  Kidney functions have also improved with creatinine of 1.26.  X-ray of the chest on 06/16/2020 shows no acute abnormality.  Denies any prior NSAID use or overt blood loss.  Interval history   07/02/2020-08/06/2020  07/02/2020: Iron studies showed a normal ferritin of 91 TIBC was not elevated.,  Celiac serology was negative.  Could not obtain a B12 level due to insurance issues.  Vitamin C levels were normal.  08/03/2020 underwent a colonoscopy.   Diverticulosis of the sigmoid colon was noted and nonbleeding internal hemorrhoids were also noted which were large in nature but nonbleeding.  5 mm polyp was resected in the transverse colon with a cold snare.  It was a tubular adenoma.  Denies any more rectal bleeding.  Since this morning she has been having some nausea and vomiting.  Able to keep liquids down.   Current Outpatient Medications  Medication Sig Dispense Refill  . acetaminophen (TYLENOL) 325 MG tablet Take 2 tablets (650 mg total) by mouth every 6 (six) hours as needed for mild pain (or Fever >/= 101).    Marland Kitchen albuterol (VENTOLIN HFA) 108 (90 Base) MCG/ACT inhaler Inhale 2 puffs into the lungs every 6 (six) hours as needed.    Marland Kitchen aspirin EC 81 MG EC tablet Take 1 tablet (81 mg total) by mouth daily.    . budesonide-formoterol (SYMBICORT) 160-4.5 MCG/ACT inhaler Inhale 2 puffs into the lungs 2 (two) times daily.    . calcium carbonate (TUMS EX) 750 MG chewable tablet Chew 1 tablet by mouth daily as needed for heartburn.    . carvedilol (COREG) 3.125 MG tablet Take 1 tablet (3.125 mg total) by mouth 2 (two) times daily with a meal. 60 tablet 6  . cetirizine (ZYRTEC) 10 MG tablet Take 10 mg by mouth daily.    . Doxylamine Succinate, Sleep, (SLEEP AID PO) Take 1 tablet by mouth at bedtime as needed (sleep).     . feeding supplement, ENSURE ENLIVE, (ENSURE ENLIVE) LIQD Take 237 mLs by mouth 2 (  two) times daily between meals.    Marland Kitchen guaiFENesin (MUCINEX) 600 MG 12 hr tablet Take 600 mg by mouth 2 (two) times daily as needed for cough.    . losartan (COZAAR) 25 MG tablet Take 0.5 tablets (12.5 mg total) by mouth daily. 45 tablet 1  . mirtazapine (REMERON) 7.5 MG tablet TAKE 1 TABLET (7.5 MG TOTAL) BY MOUTH AT BEDTIME. FOR SLEEP AND APPETITE. 30 tablet 0  . Multiple Vitamin (MULTIVITAMIN WITH MINERALS) TABS tablet Take 1 tablet by mouth daily.    . rosuvastatin (CRESTOR) 20 MG tablet Take 1 tablet (20 mg total) by mouth daily. For cholesterol.  90 tablet 3  . SUMAtriptan (IMITREX) 25 MG tablet Take 1 tablet by mouth at migraine onset, may repeat in 2 hours if headache persists or recurs. Do not exceed 2 tablets in 24 hours. 10 tablet 0   No current facility-administered medications for this visit.    Allergies as of 08/06/2020 - Review Complete 08/06/2020  Allergen Reaction Noted  . Acthar hp [corticotropin] Other (See Comments) 06/28/2013  . Codeine    . Prednisone      Review of Systems:    All systems reviewed and negative except where noted in HPI.   Observations/Objective:  Labs: CMP     Component Value Date/Time   NA 139 06/22/2020 1550   K 4.0 06/22/2020 1550   CL 108 06/22/2020 1550   CO2 18 (L) 06/22/2020 1550   GLUCOSE 84 06/22/2020 1550   BUN 17 06/22/2020 1550   CREATININE 1.26 (H) 06/22/2020 1550   CALCIUM 9.1 06/22/2020 1550   PROT 6.4 06/22/2020 1550   ALBUMIN 3.0 (L) 03/28/2020 0254   AST 12 06/22/2020 1550   ALT 6 06/22/2020 1550   ALKPHOS 235 (H) 03/28/2020 0254   BILITOT 0.5 06/22/2020 1550   GFRNONAA 38 (L) 03/28/2020 0254   GFRAA 44 (L) 03/28/2020 0254   Lab Results  Component Value Date   WBC 9.0 05/03/2020   HGB 8.9 Repeated and verified X2. (L) 05/03/2020   HCT 27.4 (L) 05/03/2020   MCV 82.9 05/03/2020   PLT 361.0 05/03/2020    Imaging Studies: No results found.  Assessment and Plan:   Anita [name] is a 59 y.o. y/o female here to follow-up for normocytic anemia and rectal bleeding.  Review of labs suggest that she has anemia although normocytic the MCV is 82 which is almost bordering a microcytic anemia.  She also has CKD.  Iron studies were normal.  Could not obtain other labs due to insurance issues.  No further rectal bleeding after colonoscopy.  Plan 1.  Refer to hematology for borderline microcytic anemia with normal iron studies and normal TIBC.?  MDS versus anemia of chronic disease  2.  Rectal bleeding likely due to internal hemorrhoids which is presently  controlled if it were to recur we can discuss options.  She has some nausea vomiting today but able to keep fluids down.  Informed her that if it gets worse may need to come to the emergency room for IV fluids.   I discussed the assessment and treatment plan with the patient. The patient was provided an opportunity to ask questions and all were answered. The patient agreed with the plan and demonstrated an understanding of the instructions.   The patient was advised to call back or seek an in-person evaluation if the symptoms worsen or if the condition fails to improve as anticipated.  I provided 15 minutes of non-face-to-face time  during this encounter.  Dr Anita Bellows MD,MRCP [facility]) Gastroenterology/Hepatology Pager: [phone]   Speech recognition software was used to dictate this note.

## 2020-08-07 ENCOUNTER — Encounter: Payer: Self-pay | Admitting: Gastroenterology

## 2020-08-14 ENCOUNTER — Inpatient Hospital Stay: Payer: Medicare Other

## 2020-08-14 ENCOUNTER — Encounter: Payer: Self-pay | Admitting: Oncology

## 2020-08-14 ENCOUNTER — Other Ambulatory Visit: Payer: Self-pay

## 2020-08-14 ENCOUNTER — Inpatient Hospital Stay: Payer: Medicare Other | Attending: Oncology | Admitting: Oncology

## 2020-08-14 VITALS — BP 138/89 | HR 79 | Temp 97.1°F | Resp 16 | Wt 134.8 lb

## 2020-08-14 DIAGNOSIS — Z86011 Personal history of benign neoplasm of the brain: Secondary | ICD-10-CM | POA: Diagnosis not present

## 2020-08-14 DIAGNOSIS — R5382 Chronic fatigue, unspecified: Secondary | ICD-10-CM | POA: Insufficient documentation

## 2020-08-14 DIAGNOSIS — N179 Acute kidney failure, unspecified: Secondary | ICD-10-CM | POA: Insufficient documentation

## 2020-08-14 DIAGNOSIS — I272 Pulmonary hypertension, unspecified: Secondary | ICD-10-CM | POA: Insufficient documentation

## 2020-08-14 DIAGNOSIS — I252 Old myocardial infarction: Secondary | ICD-10-CM

## 2020-08-14 DIAGNOSIS — Z818 Family history of other mental and behavioral disorders: Secondary | ICD-10-CM | POA: Insufficient documentation

## 2020-08-14 DIAGNOSIS — D649 Anemia, unspecified: Secondary | ICD-10-CM | POA: Diagnosis not present

## 2020-08-14 DIAGNOSIS — N1831 Chronic kidney disease, stage 3a: Secondary | ICD-10-CM | POA: Diagnosis not present

## 2020-08-14 DIAGNOSIS — Z801 Family history of malignant neoplasm of trachea, bronchus and lung: Secondary | ICD-10-CM | POA: Insufficient documentation

## 2020-08-14 DIAGNOSIS — R5383 Other fatigue: Secondary | ICD-10-CM

## 2020-08-14 DIAGNOSIS — T148XXA Other injury of unspecified body region, initial encounter: Secondary | ICD-10-CM

## 2020-08-14 DIAGNOSIS — Z888 Allergy status to other drugs, medicaments and biological substances status: Secondary | ICD-10-CM | POA: Diagnosis not present

## 2020-08-14 DIAGNOSIS — Z8249 Family history of ischemic heart disease and other diseases of the circulatory system: Secondary | ICD-10-CM | POA: Diagnosis not present

## 2020-08-14 DIAGNOSIS — Z8049 Family history of malignant neoplasm of other genital organs: Secondary | ICD-10-CM | POA: Diagnosis not present

## 2020-08-14 DIAGNOSIS — R58 Hemorrhage, not elsewhere classified: Secondary | ICD-10-CM | POA: Diagnosis not present

## 2020-08-14 DIAGNOSIS — K648 Other hemorrhoids: Secondary | ICD-10-CM

## 2020-08-14 DIAGNOSIS — Z808 Family history of malignant neoplasm of other organs or systems: Secondary | ICD-10-CM | POA: Diagnosis not present

## 2020-08-14 DIAGNOSIS — R1909 Other intra-abdominal and pelvic swelling, mass and lump: Secondary | ICD-10-CM | POA: Insufficient documentation

## 2020-08-14 DIAGNOSIS — D631 Anemia in chronic kidney disease: Secondary | ICD-10-CM | POA: Diagnosis not present

## 2020-08-14 DIAGNOSIS — Z885 Allergy status to narcotic agent status: Secondary | ICD-10-CM | POA: Insufficient documentation

## 2020-08-14 DIAGNOSIS — E611 Iron deficiency: Secondary | ICD-10-CM | POA: Insufficient documentation

## 2020-08-14 DIAGNOSIS — Z87891 Personal history of nicotine dependence: Secondary | ICD-10-CM | POA: Diagnosis not present

## 2020-08-14 DIAGNOSIS — M1990 Unspecified osteoarthritis, unspecified site: Secondary | ICD-10-CM | POA: Diagnosis not present

## 2020-08-14 DIAGNOSIS — E538 Deficiency of other specified B group vitamins: Secondary | ICD-10-CM | POA: Diagnosis not present

## 2020-08-14 DIAGNOSIS — R110 Nausea: Secondary | ICD-10-CM | POA: Insufficient documentation

## 2020-08-14 DIAGNOSIS — D123 Benign neoplasm of transverse colon: Secondary | ICD-10-CM

## 2020-08-14 DIAGNOSIS — Z79899 Other long term (current) drug therapy: Secondary | ICD-10-CM | POA: Insufficient documentation

## 2020-08-14 DIAGNOSIS — R233 Spontaneous ecchymoses: Secondary | ICD-10-CM | POA: Diagnosis not present

## 2020-08-14 LAB — RETIC PANEL
Immature Retic Fract: 14 % (ref 2.3–15.9)
RBC.: 3.55 MIL/uL — ABNORMAL LOW (ref 3.87–5.11)
Retic Count, Absolute: 68.9 10*3/uL (ref 19.0–186.0)
Retic Ct Pct: 1.9 % (ref 0.4–3.1)
Reticulocyte Hemoglobin: 25.3 pg — ABNORMAL LOW (ref >27.9)

## 2020-08-14 LAB — COMPREHENSIVE METABOLIC PANEL
ALT: 16 U/L (ref 0–44)
AST: 22 U/L (ref 15–41)
Albumin: 3.6 g/dL (ref 3.5–5.0)
Alkaline Phosphatase: 55 U/L (ref 38–126)
Anion gap: 11 (ref 5–15)
BUN: 14 mg/dL (ref 6–20)
CO2: 24 mmol/L (ref 22–32)
Calcium: 9 mg/dL (ref 8.9–10.3)
Chloride: 101 mmol/L (ref 98–111)
Creatinine, Ser: 1.04 mg/dL — ABNORMAL HIGH (ref 0.44–1.00)
GFR calc Af Amer: >60 mL/min (ref >60)
GFR calc non Af Amer: 60 mL/min — ABNORMAL LOW (ref >60)
Glucose, Bld: 102 mg/dL — ABNORMAL HIGH (ref 70–99)
Potassium: 3.2 mmol/L — ABNORMAL LOW (ref 3.5–5.1)
Sodium: 136 mmol/L (ref 135–145)
Total Bilirubin: 0.6 mg/dL (ref 0.3–1.2)
Total Protein: 6.6 g/dL (ref 6.5–8.1)

## 2020-08-14 LAB — CBC WITH DIFFERENTIAL/PLATELET
Abs Immature Granulocytes: 0.02 10*3/uL (ref 0.00–0.07)
Basophils Absolute: 0 10*3/uL (ref 0.0–0.1)
Basophils Relative: 1 %
Eosinophils Absolute: 0.1 10*3/uL (ref 0.0–0.5)
Eosinophils Relative: 1 %
HCT: 29.2 % — ABNORMAL LOW (ref 36.0–46.0)
Hemoglobin: 9.1 g/dL — ABNORMAL LOW (ref 12.0–15.0)
Immature Granulocytes: 0 %
Lymphocytes Relative: 20 %
Lymphs Abs: 1.2 10*3/uL (ref 0.7–4.0)
MCH: 26.1 pg (ref 26.0–34.0)
MCHC: 31.2 g/dL (ref 30.0–36.0)
MCV: 83.7 fL (ref 80.0–100.0)
Monocytes Absolute: 0.6 10*3/uL (ref 0.1–1.0)
Monocytes Relative: 10 %
Neutro Abs: 4 10*3/uL (ref 1.7–7.7)
Neutrophils Relative %: 68 %
Platelets: 246 10*3/uL (ref 150–400)
RBC: 3.49 MIL/uL — ABNORMAL LOW (ref 3.87–5.11)
RDW: 18 % — ABNORMAL HIGH (ref 11.5–15.5)
WBC: 5.9 10*3/uL (ref 4.0–10.5)
nRBC: 0 % (ref 0.0–0.2)

## 2020-08-14 LAB — IRON AND TIBC
Iron: 34 ug/dL (ref 28–170)
Saturation Ratios: 8 % — ABNORMAL LOW (ref 10.4–31.8)
TIBC: 412 ug/dL (ref 250–450)
UIBC: 378 ug/dL

## 2020-08-14 LAB — TECHNOLOGIST SMEAR REVIEW: Plt Morphology: ADEQUATE

## 2020-08-14 LAB — FOLATE: Folate: 9.2 ng/mL (ref >5.9)

## 2020-08-14 LAB — PROTIME-INR
INR: 1.1 (ref 0.8–1.2)
Prothrombin Time: 13.8 seconds (ref 11.4–15.2)

## 2020-08-14 LAB — APTT: aPTT: 30 seconds (ref 24–36)

## 2020-08-14 LAB — FERRITIN: Ferritin: 45 ng/mL (ref 11–307)

## 2020-08-14 LAB — TSH: TSH: 1.437 u[IU]/mL (ref 0.350–4.500)

## 2020-08-14 LAB — VITAMIN B12: Vitamin B-12: 178 pg/mL — ABNORMAL LOW (ref 180–914)

## 2020-08-14 NOTE — Progress Notes (Signed)
New patient hematology evaluation.

## 2020-08-14 NOTE — Progress Notes (Signed)
Hematology/Oncology Consult note [facility] Telephone:([phone] Fax:([phone]   Patient Care Team: [name], NP as PCP - General (Internal Medicine) Buford Dresser, MD as PCP - Cardiology (Cardiology)  REFERRING PROVIDER: [name], MD  CHIEF COMPLAINTS/REASON FOR VISIT:  Evaluation of anemia  HISTORY OF PRESENTING ILLNESS:  Anita [name] is a  59 y.o.  female with PMH listed below who was referred to me for evaluation of anemia Reviewed patient's recent labs that was done.  05/03/2020 labs revealed anemia with hemoglobin of 8.9, MCV 82.9 07/02/2020, iron saturation 11, ferritin 91, TIBC 307, iron 34. Reviewed patient's previous labs , anemia is chronic onset , duration is since April 2021.  She did not have much baseline blood work prior to April 2021.  She did have some remote blood work that was done in 2012 when she had a normal hemoglobin of 13.5. Patient has multiple medical problems.  She is a poor historian.  Daughter provides most of her 97. Patient was hospitalized in April 2021 due to acute respiratory failure with hypoxia, due to atypical pneumonia, non-STEMI, severe three-vessel disease and pulmonary hypertension, AKI. 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 Eston Esters in 2007.  Patient declined pursuing further diagnosis or therapeutic options. She also has a chronic history of MS.  Patient was recently seen by gastroenterology and had colonoscopy done. 5 mm transverse colon polyp which was resected and retrieved.  Diverticulosis.  Nonbleeding internal hemorrhoids. Patient denies any bloody or black bowel movement.  She continues to have chronic nausea with no vomiting. Appetite is not good.  Weight has been relatively stable.  She also reports bilateral upper extremity ecchymosis which is a chronic  issue for her.  Review of Systems  Constitutional: Positive for appetite change and fatigue. Negative for chills, fever and unexpected weight change.  HENT:   Negative for hearing loss and voice change.   Eyes: Negative for eye problems.  Respiratory: Negative for chest tightness and cough.   Cardiovascular: Negative for chest pain.  Gastrointestinal: Positive for nausea. Negative for abdominal distention, abdominal pain and blood in stool.  Endocrine: Negative for hot flashes.  Genitourinary: Negative for difficulty urinating and frequency.   Musculoskeletal: Negative for arthralgias.  Skin: Negative for itching and rash.  Neurological: Negative for extremity weakness.  Hematological: Negative for adenopathy. Bruises/bleeds easily.  Psychiatric/Behavioral: Negative for confusion.     MEDICAL HISTORY:  Past Medical History:  Diagnosis Date  . Abnormality of gait 07/26/2013  . Acute respiratory distress 03/20/2020  . Acute respiratory failure with hypoxia ([place]) 03/20/2020  . Allergy   . Arthritis   . Atypical pneumonia 03/22/2020  . Chickenpox   . Elevated troponin 03/22/2020  . GERD (gastroesophageal reflux disease)   . Headache(784.0)    Migraine  . Hearing loss    Right ear secondary to infection  . History of shingles   . Migraines   . Multiple sclerosis ([place])   . Obese   . Optic neuritis   . Prolonged QT interval 03/20/2020    SURGICAL HISTORY: Past Surgical History:  Procedure Laterality Date  . COLONOSCOPY WITH PROPOFOL N/A 08/03/2020   Procedure: COLONOSCOPY WITH PROPOFOL;  Surgeon: [name], MD;  Location: [facility] ENDOSCOPY;  Service: Gastroenterology;  Laterality: N/A;  . Ganglioneuroma     Resection  . LEFT HEART CATH AND CORONARY ANGIOGRAPHY N/A 03/23/2020  Procedure: LEFT HEART CATH AND CORONARY ANGIOGRAPHY;  Surgeon: Leonie Man, MD;  Location: [place] CV LAB;  Service: Cardiovascular;  Laterality: N/A;  . PILONIDAL CYST EXCISION    . PILONIDAL CYST  EXCISION  1983  . TUMOR REMOVAL  2007/2008    SOCIAL HISTORY: Social History   Socioeconomic History  . Marital status: Divorced    Spouse name: Not on file  . Number of children: Not on file  . Years of education: Not on file  . Highest education level: Not on file  Occupational History  . Not on file  Tobacco Use  . Smoking status: Former Smoker    Packs/day: 1.00    Types: Cigarettes  . Smokeless tobacco: Never Used  Substance and Sexual Activity  . Alcohol use: Yes    Comment: Occasional  . Drug use: Not on file  . Sexual activity: Not on file  Other Topics Concern  . Not on file  Social History Narrative  . Not on file   Social Determinants of Health   Financial Resource Strain:   . Difficulty of Paying Living Expenses: Not on file  Food Insecurity:   . Worried About Charity fundraiser in the Last Year: Not on file  . Ran Out of Food in the Last Year: Not on file  Transportation Needs:   . Lack of Transportation (Medical): Not on file  . Lack of Transportation (Non-Medical): Not on file  Physical Activity:   . Days of Exercise per Week: Not on file  . Minutes of Exercise per Session: Not on file  Stress:   . Feeling of Stress : Not on file  Social Connections:   . Frequency of Communication with Friends and Family: Not on file  . Frequency of Social Gatherings with Friends and Family: Not on file  . Attends Religious Services: Not on file  . Active Member of Clubs or Organizations: Not on file  . Attends Archivist Meetings: Not on file  . Marital Status: Not on file  Intimate Partner Violence:   . Fear of Current or Ex-Partner: Not on file  . Emotionally Abused: Not on file  . Physically Abused: Not on file  . Sexually Abused: Not on file    FAMILY HISTORY: Family History  Problem Relation Age of Onset  . Skin cancer Mother   . Lung cancer Father   . Uterine cancer Sister   . Heart disease Brother   . Bipolar disorder Sister   .  Throat cancer Paternal Grandmother     ALLERGIES:  is allergic to acthar hp [corticotropin], codeine, and prednisone.  MEDICATIONS:  Current Outpatient Medications  Medication Sig Dispense Refill  . acetaminophen (TYLENOL) 325 MG tablet Take 2 tablets (650 mg total) by mouth every 6 (six) hours as needed for mild pain (or Fever >/= 101).    Marland Kitchen albuterol (VENTOLIN HFA) 108 (90 Base) MCG/ACT inhaler Inhale 2 puffs into the lungs every 6 (six) hours as needed.    Marland Kitchen aspirin EC 81 MG EC tablet Take 1 tablet (81 mg total) by mouth daily.    . budesonide-formoterol (SYMBICORT) 160-4.5 MCG/ACT inhaler Inhale 2 puffs into the lungs 2 (two) times daily.    . calcium carbonate (TUMS EX) 750 MG chewable tablet Chew 1 tablet by mouth daily as needed for heartburn.    . carvedilol (COREG) 3.125 MG tablet Take 1 tablet (3.125 mg total) by mouth 2 (two) times daily with a meal. 60  tablet 6  . cetirizine (ZYRTEC) 10 MG tablet Take 10 mg by mouth daily.    . Doxylamine Succinate, Sleep, (SLEEP AID PO) Take 1 tablet by mouth at bedtime as needed (sleep).     . feeding supplement, ENSURE ENLIVE, (ENSURE ENLIVE) LIQD Take 237 mLs by mouth 2 (two) times daily between meals.    Marland Kitchen guaiFENesin (MUCINEX) 600 MG 12 hr tablet Take 600 mg by mouth 2 (two) times daily as needed for cough.    . losartan (COZAAR) 25 MG tablet Take 0.5 tablets (12.5 mg total) by mouth daily. 45 tablet 1  . mirtazapine (REMERON) 7.5 MG tablet TAKE 1 TABLET (7.5 MG TOTAL) BY MOUTH AT BEDTIME. FOR SLEEP AND APPETITE. 30 tablet 0  . Multiple Vitamin (MULTIVITAMIN WITH MINERALS) TABS tablet Take 1 tablet by mouth daily.    . Probiotic Product (PROBIOTIC ADVANCED PO) Take by mouth.    . rosuvastatin (CRESTOR) 20 MG tablet Take 1 tablet (20 mg total) by mouth daily. For cholesterol. 90 tablet 3  . SUMAtriptan (IMITREX) 25 MG tablet Take 1 tablet by mouth at migraine onset, may repeat in 2 hours if headache persists or recurs. Do not exceed 2 tablets  in 24 hours. 10 tablet 0   No current facility-administered medications for this visit.     PHYSICAL EXAMINATION: ECOG PERFORMANCE STATUS: 2 - Symptomatic, <50% confined to bed Vitals:   08/14/20 1105  BP: 138/89  Pulse: 79  Resp: 16  Temp: (!) 97.1 F (36.2 C)   Filed Weights   08/14/20 1105  Weight: 134 lb 12.8 oz (61.1 kg)    Physical Exam Constitutional:      General: She is not in acute distress. HENT:     Head: Normocephalic and atraumatic.  Eyes:     General: No scleral icterus. Cardiovascular:     Rate and Rhythm: Normal rate and regular rhythm.     Heart sounds: Murmur heard.   Pulmonary:     Effort: Pulmonary effort is normal. No respiratory distress.     Breath sounds: No wheezing.  Abdominal:     General: Bowel sounds are normal. There is no distension.     Palpations: Abdomen is soft.  Musculoskeletal:        General: No deformity. Normal range of motion.     Cervical back: Normal range of motion and neck supple.  Skin:    General: Skin is warm and dry.     Coloration: Skin is pale.     Comments: Bilateral upper extremity ecchymosis.  Neurological:     Mental Status: She is alert and oriented to person, place, and time. Mental status is at baseline.  Psychiatric:        Mood and Affect: Mood normal.      LABORATORY DATA:  I have reviewed the data as listed Lab Results  Component Value Date   WBC 5.9 08/14/2020   HGB 9.1 (L) 08/14/2020   HCT 29.2 (L) 08/14/2020   MCV 83.7 08/14/2020   PLT 246 08/14/2020   Recent Labs    03/20/20 0826 03/20/20 1701 03/27/20 0849 03/27/20 0849 03/28/20 0254 03/28/20 0254 05/03/20 1055 06/22/20 1550 08/14/20 1150  NA  --    < > 135   < > 135   < > 135 139 136  K  --    < > 4.7   < > 5.1   < > 3.8 4.0 3.2*  CL  --    < >  105   < > 105   < > 102 108 101  CO2  --    < > 17*   < > 21*   < > 26 18* 24  GLUCOSE  --    < > 142*   < > 116*   < > 89 84 102*  BUN  --    < > 68*   < > 68*   < > '17 17 14    ' CREATININE  --    < > 1.58*   < > 1.51*   < > 0.86 1.26* 1.04*  CALCIUM  --    < > 9.2   < > 9.4   < > 8.6 9.1 9.0  GFRNONAA  --    < > 36*  --  38*  --   --   --  60*  GFRAA  --    < > 42*  --  44*  --   --   --  >60  PROT 6.2*   < > 6.0*   < > 6.5  --   --  6.4 6.6  ALBUMIN 3.1*   < > 2.9*  --  3.0*  --   --   --  3.6  AST 18   < > 138*   < > 108*  --   --  12 22  ALT 11   < > 225*   < > 208*  --   --  6 16  ALKPHOS 56   < > 165*  --  235*  --   --   --  55  BILITOT 0.7   < > 0.6   < > 0.8  --   --  0.5 0.6  BILIDIR 0.1  --   --   --   --   --   --   --   --   IBILI 0.6  --   --   --   --   --   --   --   --    < > = values in this interval not displayed.   Iron/TIBC/Ferritin/ %Sat    Component Value Date/Time   IRON 34 08/14/2020 1150   IRON 34 07/02/2020 1620   TIBC 412 08/14/2020 1150   TIBC 307 07/02/2020 1620   FERRITIN 45 08/14/2020 1150   FERRITIN 91 07/02/2020 1620   IRONPCTSAT 8 (L) 08/14/2020 1150   IRONPCTSAT 11 (L) 07/02/2020 1620        ASSESSMENT & PLAN:  1. Anemia, unspecified type   2. Bruising   3. Other fatigue    Anemia: multifactorial with possible causes including chronic blood loss, hyper/hypothyroidism, nutritional deficiency, infection/chronic inflammation, hemolysis, underlying bone marrow disorders. Will check CBC w differential, CMP, vitamin B12, Folate, iron/TIBC, ferritin, reticulocytes,  blood smear, TSH,  LDH, monoclonal gammopathy evaluation.    Easy bruising, check PT, PTT Fatigue pending above work up.    Orders Placed This Encounter  Procedures  . Folate    Standing Status:   Future    Number of Occurrences:   1    Standing Expiration Date:   08/14/2021  . Ferritin    Standing Status:   Future    Number of Occurrences:   1    Standing Expiration Date:   02/11/2021  . Iron and TIBC    Standing Status:   Future    Number of Occurrences:   1    Standing Expiration  Date:   08/14/2021  . Vitamin B12    Standing Status:   Future     Number of Occurrences:   1    Standing Expiration Date:   08/14/2021  . TSH    Standing Status:   Future    Number of Occurrences:   1    Standing Expiration Date:   08/14/2021  . Technologist smear review    Standing Status:   Future    Number of Occurrences:   1    Standing Expiration Date:   08/14/2021  . Comprehensive metabolic panel    Standing Status:   Future    Number of Occurrences:   1    Standing Expiration Date:   08/14/2021  . CBC with Differential/Platelet    Standing Status:   Future    Number of Occurrences:   1    Standing Expiration Date:   08/14/2021  . Retic Panel    Standing Status:   Future    Number of Occurrences:   1    Standing Expiration Date:   08/14/2021  . Multiple Myeloma Panel (SPEP&IFE w/QIG)    Standing Status:   Future    Number of Occurrences:   1    Standing Expiration Date:   08/14/2021  . Kappa/lambda light chains    Standing Status:   Future    Number of Occurrences:   1    Standing Expiration Date:   08/14/2021  . Protime-INR    Standing Status:   Future    Number of Occurrences:   1    Standing Expiration Date:   08/14/2021  . APTT    Standing Status:   Future    Number of Occurrences:   1    Standing Expiration Date:   08/14/2021    All questions were answered. The patient knows to call the clinic with any problems questions or concerns. Cc. [name], MD  Return of visit: 2 weeks Thank you for this kind referral and the opportunity to participate in the care of this patient. A copy of today's note is routed to referring provider    Earlie Server, MD, PhD 08/14/2020

## 2020-08-15 LAB — KAPPA/LAMBDA LIGHT CHAINS
Kappa free light chain: 41.5 mg/L — ABNORMAL HIGH (ref 3.3–19.4)
Kappa, lambda light chain ratio: 2.26 — ABNORMAL HIGH (ref 0.26–1.65)
Lambda free light chains: 18.4 mg/L (ref 5.7–26.3)

## 2020-08-16 LAB — MULTIPLE MYELOMA PANEL, SERUM
Albumin SerPl Elph-Mcnc: 3.4 g/dL (ref 2.9–4.4)
Albumin/Glob SerPl: 1.2 (ref 0.7–1.7)
Alpha 1: 0.4 g/dL (ref 0.0–0.4)
Alpha2 Glob SerPl Elph-Mcnc: 0.8 g/dL (ref 0.4–1.0)
B-Globulin SerPl Elph-Mcnc: 1 g/dL (ref 0.7–1.3)
Gamma Glob SerPl Elph-Mcnc: 0.9 g/dL (ref 0.4–1.8)
Globulin, Total: 3 g/dL (ref 2.2–3.9)
IgA: 228 mg/dL (ref 87–352)
IgG (Immunoglobin G), Serum: 812 mg/dL (ref 586–1602)
IgM (Immunoglobulin M), Srm: 110 mg/dL (ref 26–217)
Total Protein ELP: 6.4 g/dL (ref 6.0–8.5)

## 2020-08-17 ENCOUNTER — Other Ambulatory Visit: Payer: Self-pay

## 2020-08-17 ENCOUNTER — Telehealth: Payer: Self-pay

## 2020-08-17 ENCOUNTER — Ambulatory Visit (INDEPENDENT_AMBULATORY_CARE_PROVIDER_SITE_OTHER): Payer: Medicare Other | Admitting: Cardiology

## 2020-08-17 ENCOUNTER — Encounter: Payer: Self-pay | Admitting: Cardiology

## 2020-08-17 VITALS — BP 122/70 | HR 84 | Ht 63.0 in | Wt 138.8 lb

## 2020-08-17 DIAGNOSIS — I255 Ischemic cardiomyopathy: Secondary | ICD-10-CM | POA: Diagnosis not present

## 2020-08-17 DIAGNOSIS — I340 Nonrheumatic mitral (valve) insufficiency: Secondary | ICD-10-CM

## 2020-08-17 DIAGNOSIS — I5042 Chronic combined systolic (congestive) and diastolic (congestive) heart failure: Secondary | ICD-10-CM | POA: Diagnosis not present

## 2020-08-17 DIAGNOSIS — I2510 Atherosclerotic heart disease of native coronary artery without angina pectoris: Secondary | ICD-10-CM

## 2020-08-17 DIAGNOSIS — Z7189 Other specified counseling: Secondary | ICD-10-CM | POA: Diagnosis not present

## 2020-08-17 MED ORDER — TORSEMIDE 20 MG PO TABS: 20.0000 mg | ORAL_TABLET | Freq: Every day | ORAL | 11 refills | Status: DC | PRN

## 2020-08-17 NOTE — Progress Notes (Signed)
Cardiology Office Note:    Date:  08/17/2020   ID:  TOIA MICALE, DOB November 09, 1961, MRN [id number]  PCP:  [name], NP  Cardiologist:  Buford Dresser, MD  Referring MD: [name], NP   CC: follow up  History of Present Illness:    Anita [name] is a 59 y.o. female with history of multiple sclerosis, CAD. I met her in the hospital 03/2020, see workup below  Today: I met her in the hospital on 03/2020. She was seen by Coletta Memos post discharge.  Has had mild LE edema for about two weeks. Weight has actually gone down. Has good appetite, but struggling to keep food down. Food has started to taste bad to her. Noted after she quit smoking, dedicated to staying off tobacco.  Blood pressure well controlled today. Does note that home visiting nurse noted borderline low blood pressure at one point.   Denies chest pain, shortness of breath at rest or with normal exertion. No PND, orthopnea, or unexpected weight gain. No syncope, rare palpitations.   Past Medical History:  Diagnosis Date   Abnormality of gait 07/26/2013   Acute respiratory distress 03/20/2020   Acute respiratory failure with hypoxia (HCC) 03/20/2020   Allergy    Arthritis    Atypical pneumonia 03/22/2020   Chickenpox    Elevated troponin 03/22/2020   GERD (gastroesophageal reflux disease)    Headache(784.0)    Migraine   Hearing loss    Right ear secondary to infection   History of shingles    Migraines    Multiple sclerosis (HCC)    Obese    Optic neuritis    Prolonged QT interval 03/20/2020    Past Surgical History:  Procedure Laterality Date   COLONOSCOPY WITH PROPOFOL N/A 08/03/2020   Procedure: COLONOSCOPY WITH PROPOFOL;  Surgeon: [name], MD;  Location: [facility] END[facility];  Service: Gastroenterology;  Laterality: N/A;   Ganglioneuroma     Resection   LEFT HEART CATH AND CORONARY ANGIOGRAPHY N/A 03/23/2020   Procedure: LEFT HEART CATH AND CORONARY  ANGIOGRAPHY;  Surgeon: Leonie Man, MD;  Location: [place] [facility];  Service: Cardiovascular;  Laterality: N/A;   PILONIDAL CYST EXCISION     PILONIDAL CYST EXCISION  1983   TUMOR REMOVAL  2007/2008    Current Medications: Current Outpatient Medications on File Prior to Visit  Medication Sig   acetaminophen (TYLENOL) 325 MG tablet Take 2 tablets (650 mg total) by mouth every 6 (six) hours as needed for mild pain (or Fever >/= 101).   albuterol (VENTOLIN HFA) 108 (90 Base) MCG/ACT inhaler Inhale 2 puffs into the lungs every 6 (six) hours as needed.   aspirin EC 81 MG EC tablet Take 1 tablet (81 mg total) by mouth daily.   budesonide-formoterol (SYMBICORT) 160-4.5 MCG/ACT inhaler Inhale 2 puffs into the lungs 2 (two) times daily.   calcium carbonate (TUMS EX) 750 MG chewable tablet Chew 1 tablet by mouth daily as needed for heartburn.   carvedilol (COREG) 3.125 MG tablet Take 1 tablet (3.125 mg total) by mouth 2 (two) times daily with a meal.   cetirizine (ZYRTEC) 10 MG tablet Take 10 mg by mouth daily.   Doxylamine Succinate, Sleep, (SLEEP AID PO) Take 1 tablet by mouth at bedtime as needed (sleep).    feeding supplement, ENSURE ENLIVE, (ENSURE ENLIVE) LIQD Take 237 mLs by mouth 2 (two) times daily between meals.   guaiFENesin (MUCINEX) 600 MG 12 hr tablet Take 600 mg  by mouth 2 (two) times daily as needed for cough.   losartan (COZAAR) 25 MG tablet Take 0.5 tablets (12.5 mg total) by mouth daily.   mirtazapine (REMERON) 7.5 MG tablet TAKE 1 TABLET (7.5 MG TOTAL) BY MOUTH AT BEDTIME. FOR SLEEP AND APPETITE.   Multiple Vitamin (MULTIVITAMIN WITH MINERALS) TABS tablet Take 1 tablet by mouth daily.   nitroGLYCERIN (NITROSTAT) 0.4 MG SL tablet as needed.   Probiotic Product (PROBIOTIC ADVANCED PO) Take by mouth.   rosuvastatin (CRESTOR) 20 MG tablet Take 1 tablet (20 mg total) by mouth daily. For cholesterol.   SUMAtriptan (IMITREX) 25 MG tablet Take 1 tablet by mouth  at migraine onset, may repeat in 2 hours if headache persists or recurs. Do not exceed 2 tablets in 24 hours.   No current facility-administered medications on file prior to visit.     Allergies:   Acthar hp [corticotropin], Codeine, and Prednisone   Social History   Tobacco Use   Smoking status: Former Smoker    Packs/day: 1.00    Types: Cigarettes   Smokeless tobacco: Never Used  Substance Use Topics   Alcohol use: Yes    Comment: Occasional   Drug use: Not on file    Family History: family history includes Bipolar disorder in her sister; Heart disease in her brother; Lung cancer in her father; Skin cancer in her mother; Throat cancer in her paternal grandmother; Uterine cancer in her sister.  ROS:   Please see the history of present illness.  Additional pertinent ROS otherwise unremarkable.    EKGs/Labs/Other Studies Reviewed:    The following studies were reviewed today: Echo 03/20/20 1. Left ventricular ejection fraction, by estimation, is 40 to 45%. The  left ventricle has mildly decreased function. The left ventricle  demonstrates global hypokinesis. The left ventricular internal cavity size  was mildly dilated. Left ventricular  diastolic function could not be evaluated.  2. Right ventricular systolic function is normal. The right ventricular  size is normal. There is moderately elevated pulmonary artery systolic  pressure.  3. Left atrial size was mildly dilated.  4. Moderate mitral subvalvular thickening/fibrosis.  5. The mitral valve is rheumatic. Moderate to severe mitral valve  regurgitation. Mild mitral stenosis. The mean mitral valve gradient is 7.3  mmHg with average heart rate of 105 bpm.  6. The aortic valve is normal in structure. Aortic valve regurgitation is  not visualized.  7. The inferior vena cava is dilated in size with <50% respiratory  variability, suggesting right atrial pressure of 15 mmHg.  Cath 03/23/20  Hemodynamics: LV end  diastolic pressure is moderately elevated.  ----Coronary Angiography-----  Ost RCA to Dist RCA lesion is 100% stenosed.-The PDA and PL system fills via faint collaterals from the AV groove LCx and LAD septals.  Mid LAD-1 lesion is 60% stenosed. Mid LAD-2 lesion is 50% stenosed. Dist LAD lesion is 55% stenosed with 70% stenosed side branch in 2nd Diag.  1st Diag lesion is 70% stenosed.  Ost Cx to Prox Cx lesion is 45% stenosed. Prox-MID Cx lesion is 65% stenosed.   MODERATE-SEVERE THREE-VESSEL CAD: ? 100% proximal RCA occlusion with left-to-right collaterals faintly filling PDA and PL system (AV groove LCx-PL and LAD septal-PDA) ? Diffuse moderate mid LAD disease with 60% to 50% stenosis. ? Tandem 50% and 65% proximal and mid LCx with the 65% lesion being the most significant lesion.  Moderately elevated LVEDP of 18-20 mmHg  Given the extent of disease in the LAD and LCx,  neither lesion is very amenable to PCI, and RCA is chronically occluded. Given her comorbidities, I do not think she would be a good CABG candidate especially in light of the fact that she would likely require valve surgery as well. She would not recover well.  Recommendations aggressive medical management.  EKG:  EKG is personally reviewed.  The ekg ordered 03/22/20 demonstrates sinus tachycardia with nonspecific ST pattern  Recent Labs: 03/20/2020: B Natriuretic Peptide 1,255.6 03/28/2020: Magnesium 2.7 08/14/2020: ALT 16; BUN 14; Creatinine, Ser 1.04; Hemoglobin 9.1; Platelets 246; Potassium 3.2; Sodium 136; TSH 1.437  Recent Lipid Panel    Component Value Date/Time   TRIG 80 03/20/2020 0743    Physical Exam:    VS:  BP 122/70    Pulse 84    Ht '5\' 3"'  (1.6 m)    Wt 138 lb 12.8 oz (63 kg)    SpO2 97%    BMI 24.59 kg/m     Wt Readings from Last 3 Encounters:  08/17/20 138 lb 12.8 oz (63 kg)  08/14/20 134 lb 12.8 oz (61.1 kg)  08/03/20 125 lb (56.7 kg)    GEN: Well nourished, well developed in no acute  distress HEENT: Normal, moist mucous membranes NECK: JVD at clavicle at 90 degrees CARDIAC: regular rhythm, normal S1 and S2, no rubs or gallops. 2/6 holosystolic murmurs. VASCULAR: Radial and DP pulses 2+ bilaterally. No carotid bruits RESPIRATORY:  Largely clear but diffusely distant breath sounds ABDOMEN: Soft, non-tender, mildly distended MUSCULOSKELETAL:  Ambulates independently SKIN: Warm and dry, bilateral 1+ pitting edema to thighs NEUROLOGIC:  Alert and oriented x 3. No focal neuro deficits noted. PSYCHIATRIC:  Normal affect    ASSESSMENT:    1. Ischemic cardiomyopathy   2. Chronic combined systolic and diastolic heart failure ([place])   3. Coronary artery disease involving native coronary artery of native heart without angina pectoris   4. Cardiac risk counseling   5. Counseling on health promotion and disease prevention   6. Mitral valve insufficiency, unspecified etiology    PLAN:    Cardiomyopathy, likely ischemic Chronic systolic and diastolic heart failure CAD with CTO RCA -tolerating aspirin 81 mg daily -continue rosuvastatin 20 mg daily -low blood pressures preclude increase in GDMT for heart failure -tolerating carvedilol 3.125 mg BID and losartan 12.5 mg daily -given significant edema, will use torsemide for three days and then PRN for swelling -given heart failure education today: Do the following things EVERY DAY:  1) Weigh yourself EVERY morning after you go to the bathroom but before you eat or drink anything. Write this number down in a weight log/diary. If you gain 3 pounds overnight or 5 pounds in a week, call the office.  2) Take your medicines as prescribed. If you have concerns about your medications, please call us before you stop taking them.   3) Eat low salt foods--Limit salt (sodium) to 2000 mg per day. This will help prevent your body from holding onto fluid. Read food labels as many processed foods have a lot of sodium, especially canned goods  and prepackaged meats. If you would like some assistance choosing low sodium foods, we would be happy to set you up with a nutritionist.  4) Stay as active as you can everyday. Staying active will give you more energy and make your muscles stronger. Start with 5 minutes at a time and work your way up to 30 minutes a day. Break up your activities--do some in the morning and some in the  afternoon. Start with 3 days per week and work your way up to 5 days as you can.  If you have chest pain, feel short of breath, dizzy, or lightheaded, STOP. If you don't feel better after a short rest, call 911. If you do feel better, call the office to let us know you have symptoms with exercise.  5) Limit all fluids for the day to less than 2 liters. Fluid includes all drinks, coffee, juice, ice chips, soup, jello, and all other liquids.  Moderate to severe mitral regurgitation with rheumatic appearing mitral valve, without severe mitral stenosis -thickened leaflets with rheumatic movement -MVA >3 based on pressure half time -if edema and/or other symptoms worsen, consider TEE to further evaluate the valve  Cardiac risk counseling and prevention recommendations: -recommend heart healthy/Mediterranean diet, with whole grains, fruits, vegetable, fish, lean meats, nuts, and olive oil. Limit salt. -recommend moderate walking, 3-5 times/week for 30-50 minutes each session. Aim for at least 150 minutes.week. Goal should be pace of 3 miles/hours, or walking 1.5 miles in 30 minutes -recommend avoidance of tobacco products. Avoid excess alcohol.  Plan for follow up: 3 mos or sooner as needed  Buford Dresser, MD, PhD [place]   [facility]    Medication Adjustments/Labs and Tests Ordered: Current medicines are reviewed at length with the patient today.  Concerns regarding medicines are outlined above.  No orders of the defined types were placed in this encounter.  Meds ordered this encounter  Medications    torsemide (DEMADEX) 20 MG tablet    Sig: Take 1 tablet (20 mg total) by mouth daily as needed. For leg or abdomen swelling    Dispense:  30 tablet    Refill:  11    Patient Instructions  Medication Instructions:  Take Torsemide 20 mg daily for 3 days. Then as needed for abdominal or leg swelling. If you notice you have take it more than once a week, please call our office   *If you need a refill on your cardiac medications before your next appointment, please call your pharmacy*   Lab Work: None ordered    Testing/Procedures: None ordered   Follow-Up: At [facility], you and your health needs are our priority.  As part of our continuing mission to provide you with exceptional heart care, we have created designated Provider Care Teams.  These Care Teams include your primary Cardiologist (physician) and Advanced Practice Providers (APPs -  Physician Assistants and Nurse Practitioners) who all work together to provide you with the care you need, when you need it.  We recommend signing up for the patient portal called "MyChart".  Sign up information is provided on this After Visit Summary.  MyChart is used to connect with patients for Virtual Visits (Telemedicine).  Patients are able to view lab/test results, encounter notes, upcoming appointments, etc.  Non-urgent messages can be sent to your provider as well.   To learn more about what you can do with MyChart, go to [email or web address].    Your next appointment:   3 month(s)  The format for your next appointment:   In Person  Provider:   Buford Dresser, MD       Signed, Buford Dresser, MD PhD 08/17/2020    [place]

## 2020-08-17 NOTE — Patient Instructions (Signed)
Medication Instructions:  Take Torsemide 20 mg daily for 3 days. Then as needed for abdominal or leg swelling. If you notice you have take it more than once a week, please call our office   *If you need a refill on your cardiac medications before your next appointment, please call your pharmacy*   Lab Work: None ordered    Testing/Procedures: None ordered   Follow-Up: At [facility], you and your health needs are our priority.  As part of our continuing mission to provide you with exceptional heart care, we have created designated Provider Care Teams.  These Care Teams include your primary Cardiologist (physician) and Advanced Practice Providers (APPs -  Physician Assistants and Nurse Practitioners) who all work together to provide you with the care you need, when you need it.  We recommend signing up for the patient portal called "MyChart".  Sign up information is provided on this After Visit Summary.  MyChart is used to connect with patients for Virtual Visits (Telemedicine).  Patients are able to view lab/test results, encounter notes, upcoming appointments, etc.  Non-urgent messages can be sent to your provider as well.   To learn more about what you can do with MyChart, go to [email or web address].    Your next appointment:   3 month(s)  The format for your next appointment:   In Person  Provider:   Buford Dresser, MD

## 2020-08-17 NOTE — Telephone Encounter (Signed)
Done.. Pts 08/28/20 MyChart appt was moved up to 08/23/20 as requested. I was unable to reach pt by phone.  A detailed message was left on pts daughter [name] VM making her aware of the NEW sched date and time

## 2020-08-17 NOTE — Telephone Encounter (Signed)
-----   Message from Earlie Server, MD sent at 08/16/2020 10:05 PM EDT ----- All her blood work up results are back. Ok to move her virtual appt earlier to next Thursday?

## 2020-08-17 NOTE — Telephone Encounter (Addendum)
Please contact patient to move the virtual visit to next Thursday.  Patient notified via MyChart message that she will be getting a call from scheduling for new appt details.

## 2020-08-23 ENCOUNTER — Other Ambulatory Visit: Payer: Self-pay

## 2020-08-23 ENCOUNTER — Inpatient Hospital Stay (HOSPITAL_BASED_OUTPATIENT_CLINIC_OR_DEPARTMENT_OTHER): Payer: Medicare Other | Admitting: Oncology

## 2020-08-23 ENCOUNTER — Encounter: Payer: Self-pay | Admitting: Oncology

## 2020-08-23 DIAGNOSIS — N1831 Chronic kidney disease, stage 3a: Secondary | ICD-10-CM

## 2020-08-23 DIAGNOSIS — R233 Spontaneous ecchymoses: Secondary | ICD-10-CM | POA: Diagnosis not present

## 2020-08-23 DIAGNOSIS — D509 Iron deficiency anemia, unspecified: Secondary | ICD-10-CM | POA: Insufficient documentation

## 2020-08-23 DIAGNOSIS — I252 Old myocardial infarction: Secondary | ICD-10-CM | POA: Diagnosis not present

## 2020-08-23 DIAGNOSIS — D631 Anemia in chronic kidney disease: Secondary | ICD-10-CM

## 2020-08-23 DIAGNOSIS — E538 Deficiency of other specified B group vitamins: Secondary | ICD-10-CM | POA: Insufficient documentation

## 2020-08-23 DIAGNOSIS — E611 Iron deficiency: Secondary | ICD-10-CM | POA: Diagnosis not present

## 2020-08-23 MED ORDER — FERROUS SULFATE 325 (65 FE) MG PO TBEC: 325.0000 mg | DELAYED_RELEASE_TABLET | Freq: Two times a day (BID) | ORAL | 3 refills | Status: DC

## 2020-08-23 MED ORDER — VITAMIN B-12 1000 MCG PO TABS: 1000.0000 ug | ORAL_TABLET | Freq: Every day | ORAL | 0 refills | Status: DC

## 2020-08-23 NOTE — Progress Notes (Signed)
HEMATOLOGY-ONCOLOGY TeleHEALTH VISIT PROGRESS NOTE  I connected with Anita [name] on 08/23/20 at 11:00 AM EDT by video enabled telemedicine visit and verified that I am speaking with the correct person using two identifiers. I discussed the limitations, risks, security and privacy concerns of performing an evaluation and management service by telemedicine and the availability of in-person appointments. I also discussed with the patient that there may be a patient responsible charge related to this service. The patient expressed understanding and agreed to proceed.   Other persons participating in the visit and their role in the encounter:  None  Patient's location: Home  Provider's location: office Chief Complaint: Follow-up for anemia   INTERVAL HISTORY Anita [name] is a 59 y.o. female who has above history reviewed by me today presents for follow up visit for anemia  Problems and complaints are listed below:  Patient had blood work done 2 weeks ago.  Presents to discuss results and management plan.  She has no new complaints.  Chronic fatigue. Review of Systems  Constitutional: Positive for fatigue. Negative for appetite change, chills and fever.  HENT:   Negative for hearing loss and voice change.   Eyes: Negative for eye problems.  Respiratory: Negative for chest tightness and cough.   Cardiovascular: Negative for chest pain.  Gastrointestinal: Negative for abdominal distention, abdominal pain and blood in stool.  Endocrine: Negative for hot flashes.  Genitourinary: Negative for difficulty urinating and frequency.   Musculoskeletal: Negative for arthralgias.  Skin: Negative for itching and rash.  Neurological: Negative for extremity weakness.  Hematological: Negative for adenopathy.  Psychiatric/Behavioral: Negative for confusion.    Past Medical History:  Diagnosis Date  . Abnormality of gait 07/26/2013  . Acute respiratory distress 03/20/2020  . Acute  respiratory failure with hypoxia ([place]) 03/20/2020  . Allergy   . Arthritis   . Atypical pneumonia 03/22/2020  . Chickenpox   . Elevated troponin 03/22/2020  . GERD (gastroesophageal reflux disease)   . Headache(784.0)    Migraine  . Hearing loss    Right ear secondary to infection  . History of shingles   . Migraines   . Multiple sclerosis ([place])   . Obese   . Optic neuritis   . Prolonged QT interval 03/20/2020   Past Surgical History:  Procedure Laterality Date  . COLONOSCOPY WITH PROPOFOL N/A 08/03/2020   Procedure: COLONOSCOPY WITH PROPOFOL;  Surgeon: [name], MD;  Location: [facility] ENDOSCOPY;  Service: Gastroenterology;  Laterality: N/A;  . Ganglioneuroma     Resection  . LEFT HEART CATH AND CORONARY ANGIOGRAPHY N/A 03/23/2020   Procedure: LEFT HEART CATH AND CORONARY ANGIOGRAPHY;  Surgeon: Leonie Man, MD;  Location: [facility];  Service: Cardiovascular;  Laterality: N/A;  . PILONIDAL CYST EXCISION    . PILONIDAL CYST EXCISION  1983  . TUMOR REMOVAL  2007/2008    Family History  Problem Relation Age of Onset  . Skin cancer Mother   . Lung cancer Father   . Uterine cancer Sister   . Heart disease Brother   . Bipolar disorder Sister   . Throat cancer Paternal Grandmother     Social History   Socioeconomic History  . Marital status: Divorced    Spouse name: Not on file  . Number of children: Not on file  . Years of education: Not on file  . Highest education level: Not on file  Occupational History  . Not on file  Tobacco Use  . Smoking status: Former  Smoker    Packs/day: 1.00    Types: Cigarettes  . Smokeless tobacco: Never Used  Substance and Sexual Activity  . Alcohol use: Yes    Comment: Occasional  . Drug use: Not on file  . Sexual activity: Not on file  Other Topics Concern  . Not on file  Social History Narrative  . Not on file   Social Determinants of Health   Financial Resource Strain:   . Difficulty of Paying Living Expenses: Not  on file  Food Insecurity:   . Worried About Charity fundraiser in the Last Year: Not on file  . Ran Out of Food in the Last Year: Not on file  Transportation Needs:   . Lack of Transportation (Medical): Not on file  . Lack of Transportation (Non-Medical): Not on file  Physical Activity:   . Days of Exercise per Week: Not on file  . Minutes of Exercise per Session: Not on file  Stress:   . Feeling of Stress : Not on file  Social Connections:   . Frequency of Communication with Friends and Family: Not on file  . Frequency of Social Gatherings with Friends and Family: Not on file  . Attends Religious Services: Not on file  . Active Member of Clubs or Organizations: Not on file  . Attends Archivist Meetings: Not on file  . Marital Status: Not on file  Intimate Partner Violence:   . Fear of Current or Ex-Partner: Not on file  . Emotionally Abused: Not on file  . Physically Abused: Not on file  . Sexually Abused: Not on file    Current Outpatient Medications on File Prior to Visit  Medication Sig Dispense Refill  . acetaminophen (TYLENOL) 325 MG tablet Take 2 tablets (650 mg total) by mouth every 6 (six) hours as needed for mild pain (or Fever >/= 101).    Marland Kitchen albuterol (VENTOLIN HFA) 108 (90 Base) MCG/ACT inhaler Inhale 2 puffs into the lungs every 6 (six) hours as needed.    Marland Kitchen aspirin EC 81 MG EC tablet Take 1 tablet (81 mg total) by mouth daily.    . budesonide-formoterol (SYMBICORT) 160-4.5 MCG/ACT inhaler Inhale 2 puffs into the lungs 2 (two) times daily.    . calcium carbonate (TUMS EX) 750 MG chewable tablet Chew 1 tablet by mouth daily as needed for heartburn.    . carvedilol (COREG) 3.125 MG tablet Take 1 tablet (3.125 mg total) by mouth 2 (two) times daily with a meal. 60 tablet 6  . cetirizine (ZYRTEC) 10 MG tablet Take 10 mg by mouth daily.    . Doxylamine Succinate, Sleep, (SLEEP AID PO) Take 1 tablet by mouth at bedtime as needed (sleep).     . feeding supplement,  ENSURE ENLIVE, (ENSURE ENLIVE) LIQD Take 237 mLs by mouth 2 (two) times daily between meals.    Marland Kitchen guaiFENesin (MUCINEX) 600 MG 12 hr tablet Take 600 mg by mouth 2 (two) times daily as needed for cough.    . losartan (COZAAR) 25 MG tablet Take 0.5 tablets (12.5 mg total) by mouth daily. 45 tablet 1  . mirtazapine (REMERON) 7.5 MG tablet TAKE 1 TABLET (7.5 MG TOTAL) BY MOUTH AT BEDTIME. FOR SLEEP AND APPETITE. 30 tablet 0  . Multiple Vitamin (MULTIVITAMIN WITH MINERALS) TABS tablet Take 1 tablet by mouth daily.    . nitroGLYCERIN (NITROSTAT) 0.4 MG SL tablet as needed.    . Probiotic Product (PROBIOTIC ADVANCED PO) Take by mouth.    Marland Kitchen  rosuvastatin (CRESTOR) 20 MG tablet Take 1 tablet (20 mg total) by mouth daily. For cholesterol. 90 tablet 3  . SUMAtriptan (IMITREX) 25 MG tablet Take 1 tablet by mouth at migraine onset, may repeat in 2 hours if headache persists or recurs. Do not exceed 2 tablets in 24 hours. 10 tablet 0  . torsemide (DEMADEX) 20 MG tablet Take 1 tablet (20 mg total) by mouth daily as needed. For leg or abdomen swelling 30 tablet 11   No current facility-administered medications on file prior to visit.    Allergies  Allergen Reactions  . Acthar Hp [Corticotropin] Other (See Comments)    Throat swelling and coughing up blood  . Codeine   . Prednisone        Observations/Objective: Today's Vitals   08/23/20 1050  PainSc: 0-No pain   There is no height or weight on file to calculate BMI.  Physical Exam  CBC    Component Value Date/Time   WBC 5.9 08/14/2020 1150   RBC 3.55 (L) 08/14/2020 1150   RBC 3.49 (L) 08/14/2020 1150   HGB 9.1 (L) 08/14/2020 1150   HCT 29.2 (L) 08/14/2020 1150   PLT 246 08/14/2020 1150   MCV 83.7 08/14/2020 1150   MCH 26.1 08/14/2020 1150   MCHC 31.2 08/14/2020 1150   RDW 18.0 (H) 08/14/2020 1150   LYMPHSABS 1.2 08/14/2020 1150   MONOABS 0.6 08/14/2020 1150   EOSABS 0.1 08/14/2020 1150   BASOSABS 0.0 08/14/2020 1150    CMP      Component Value Date/Time   NA 136 08/14/2020 1150   K 3.2 (L) 08/14/2020 1150   CL 101 08/14/2020 1150   CO2 24 08/14/2020 1150   GLUCOSE 102 (H) 08/14/2020 1150   BUN 14 08/14/2020 1150   CREATININE 1.04 (H) 08/14/2020 1150   CREATININE 1.26 (H) 06/22/2020 1550   CALCIUM 9.0 08/14/2020 1150   PROT 6.6 08/14/2020 1150   ALBUMIN 3.6 08/14/2020 1150   AST 22 08/14/2020 1150   ALT 16 08/14/2020 1150   ALKPHOS 55 08/14/2020 1150   BILITOT 0.6 08/14/2020 1150   GFRNONAA 60 (L) 08/14/2020 1150   GFRAA >60 08/14/2020 1150     Assessment and Plan: 1. Iron deficiency anemia, unspecified iron deficiency anemia type   2. Vitamin B12 deficiency   3. Anemia due to stage 3a chronic kidney disease     Labs reviewed and discussed with patient. Anemia is multifactorial likely secondary to iron deficiency as well as secondary to chronic kidney disease. Recommend iron supplementation.  Discussed about oral iron supplementation and IV Venofer treatments. Allergy reactions/infusion reaction including anaphylactic reaction discussed with patient. Other side effects include but not limited to high blood pressure, skin rash, weight gain, leg swelling, etc. Patient voices understanding and willing to proceed. Patient prefers to start with oral iron supplementation.  Recommend ferrous sulfate 325 mg twice daily with meals. Discussed about GI side effects.  Prescription sent to pharmacy.  If no significant improvement and hemoglobin is persistent less than 10, recommend patient to start with Venofer treatments.  Vitamin B12 deficiency, recommend patient to be started on oral iron supplementation 1000 MCG daily.  Prescription sent to pharmacy.   Follow Up Instructions: 6 weeks   I discussed the assessment and treatment plan with the patient. The patient was provided an opportunity to ask questions and all were answered. The patient agreed with the plan and demonstrated an understanding of the  instructions.  The patient was advised to call  back or seek an in-person evaluation if the symptoms worsen or if the condition fails to improve as anticipated.    Earlie Server, MD 08/23/2020 8:34 PM

## 2020-08-23 NOTE — Progress Notes (Signed)
Patient denies new problems/concerns today.   °

## 2020-08-24 ENCOUNTER — Other Ambulatory Visit: Payer: Self-pay | Admitting: Primary Care

## 2020-08-24 DIAGNOSIS — G4700 Insomnia, unspecified: Secondary | ICD-10-CM

## 2020-08-24 DIAGNOSIS — R630 Anorexia: Secondary | ICD-10-CM

## 2020-08-28 ENCOUNTER — Telehealth: Payer: Medicare Other | Admitting: Oncology

## 2020-08-28 DIAGNOSIS — J439 Emphysema, unspecified: Secondary | ICD-10-CM

## 2020-08-29 MED ORDER — ALBUTEROL SULFATE HFA 108 (90 BASE) MCG/ACT IN AERS: 2.0000 | INHALATION_SPRAY | Freq: Four times a day (QID) | RESPIRATORY_TRACT | 0 refills | Status: DC | PRN

## 2020-09-04 ENCOUNTER — Telehealth: Payer: Self-pay | Admitting: Cardiology

## 2020-09-04 NOTE — Telephone Encounter (Addendum)
Reports swelling in both legs with left leg being worse. Denies redness or pain in legs Reports SOB since last hospital discharge that is somewhat better and reports is due to COPD Reports ongoing intermittent fluttering since hospital admission that is unchanged Denies chest pain or dizziness Reports not weighing in the past couple of weeks Patient weighed while on phone is 150.4 lbs Today BP 179/118. BP yesterday 183/90. Denies headache or blurred vision Advised that best weight measurement is upon waking up each day, same time, same scale and same amount of clothes Medications reviewed Reports taking last dose of torsemide 20 mg greater than 1 week ago Advised that patient can go ahead and take torsemide 20 mg x's 3 days and call if no improvement after 3rd day. Advised to weigh daily. Advised if symptoms get worse, to go to the ED for an evaluation. Verbalized understanding of plan.

## 2020-09-04 NOTE — Telephone Encounter (Signed)
This is 8, Pat's daughter.  I wanted to take a quick minute to follow up with you on the Torsemide.  It worked to reduce her swelling on the first of the 3 days and her leg was almost back down to normal size. Days 2 and 3 we were still seeing some swelling, but not as much.  After that it came right back.  She has taken it a couple more times with little to no result.  Now over the last week her blood pressure has been rising. This morning it is 179/118.  Could the Torsemide be causing this? What should we do?  Thanks in advance.

## 2020-09-04 NOTE — Telephone Encounter (Signed)
Patient's daughter returned call. Please see MyChart message in Epic

## 2020-09-11 NOTE — Telephone Encounter (Signed)
Can we reach out and see if the swelling has improved? Thanks.

## 2020-09-11 NOTE — Telephone Encounter (Signed)
Left message to call back  

## 2020-09-12 ENCOUNTER — Encounter: Payer: Self-pay | Admitting: Emergency Medicine

## 2020-09-12 ENCOUNTER — Other Ambulatory Visit: Payer: Self-pay

## 2020-09-12 DIAGNOSIS — Z79899 Other long term (current) drug therapy: Secondary | ICD-10-CM | POA: Insufficient documentation

## 2020-09-12 DIAGNOSIS — Z87891 Personal history of nicotine dependence: Secondary | ICD-10-CM | POA: Diagnosis not present

## 2020-09-12 DIAGNOSIS — Z955 Presence of coronary angioplasty implant and graft: Secondary | ICD-10-CM | POA: Insufficient documentation

## 2020-09-12 DIAGNOSIS — N3090 Cystitis, unspecified without hematuria: Secondary | ICD-10-CM | POA: Diagnosis not present

## 2020-09-12 DIAGNOSIS — I5021 Acute systolic (congestive) heart failure: Secondary | ICD-10-CM | POA: Diagnosis not present

## 2020-09-12 DIAGNOSIS — J449 Chronic obstructive pulmonary disease, unspecified: Secondary | ICD-10-CM | POA: Insufficient documentation

## 2020-09-12 DIAGNOSIS — Z7951 Long term (current) use of inhaled steroids: Secondary | ICD-10-CM | POA: Insufficient documentation

## 2020-09-12 DIAGNOSIS — N3001 Acute cystitis with hematuria: Secondary | ICD-10-CM | POA: Diagnosis not present

## 2020-09-12 DIAGNOSIS — R0602 Shortness of breath: Secondary | ICD-10-CM | POA: Insufficient documentation

## 2020-09-12 DIAGNOSIS — I110 Hypertensive heart disease with heart failure: Secondary | ICD-10-CM | POA: Insufficient documentation

## 2020-09-12 DIAGNOSIS — Z7982 Long term (current) use of aspirin: Secondary | ICD-10-CM | POA: Insufficient documentation

## 2020-09-12 DIAGNOSIS — R350 Frequency of micturition: Secondary | ICD-10-CM | POA: Diagnosis present

## 2020-09-12 LAB — CBC WITH DIFFERENTIAL/PLATELET
Abs Immature Granulocytes: 0.03 10*3/uL (ref 0.00–0.07)
Basophils Absolute: 0.1 10*3/uL (ref 0.0–0.1)
Basophils Relative: 1 %
Eosinophils Absolute: 0.1 10*3/uL (ref 0.0–0.5)
Eosinophils Relative: 1 %
HCT: 32.1 % — ABNORMAL LOW (ref 36.0–46.0)
Hemoglobin: 9.8 g/dL — ABNORMAL LOW (ref 12.0–15.0)
Immature Granulocytes: 0 %
Lymphocytes Relative: 12 %
Lymphs Abs: 1.2 10*3/uL (ref 0.7–4.0)
MCH: 26.1 pg (ref 26.0–34.0)
MCHC: 30.5 g/dL (ref 30.0–36.0)
MCV: 85.6 fL (ref 80.0–100.0)
Monocytes Absolute: 0.9 10*3/uL (ref 0.1–1.0)
Monocytes Relative: 9 %
Neutro Abs: 7.7 10*3/uL (ref 1.7–7.7)
Neutrophils Relative %: 77 %
Platelets: 208 10*3/uL (ref 150–400)
RBC: 3.75 MIL/uL — ABNORMAL LOW (ref 3.87–5.11)
RDW: 18.8 % — ABNORMAL HIGH (ref 11.5–15.5)
WBC: 10 10*3/uL (ref 4.0–10.5)
nRBC: 0 % (ref 0.0–0.2)

## 2020-09-12 LAB — URINALYSIS, COMPLETE (UACMP) WITH MICROSCOPIC
Bacteria, UA: NONE SEEN
RBC / HPF: >50 RBC/hpf — ABNORMAL HIGH (ref 0–5)
Specific Gravity, Urine: 1.014 (ref 1.005–1.030)
WBC, UA: >50 WBC/hpf — ABNORMAL HIGH (ref 0–5)

## 2020-09-12 LAB — COMPREHENSIVE METABOLIC PANEL
ALT: 14 U/L (ref 0–44)
AST: 22 U/L (ref 15–41)
Albumin: 4 g/dL (ref 3.5–5.0)
Alkaline Phosphatase: 47 U/L (ref 38–126)
Anion gap: 11 (ref 5–15)
BUN: 21 mg/dL — ABNORMAL HIGH (ref 6–20)
CO2: 29 mmol/L (ref 22–32)
Calcium: 9.5 mg/dL (ref 8.9–10.3)
Chloride: 100 mmol/L (ref 98–111)
Creatinine, Ser: 1.23 mg/dL — ABNORMAL HIGH (ref 0.44–1.00)
GFR calc non Af Amer: 49 mL/min — ABNORMAL LOW (ref >60)
Glucose, Bld: 92 mg/dL (ref 70–99)
Potassium: 2.5 mmol/L — CL (ref 3.5–5.1)
Sodium: 140 mmol/L (ref 135–145)
Total Bilirubin: 1 mg/dL (ref 0.3–1.2)
Total Protein: 7.1 g/dL (ref 6.5–8.1)

## 2020-09-12 NOTE — ED Triage Notes (Signed)
Pt presents to ED from home with urinary frequency and urgency this evening. Pt states she had to wear a depends today because her urine was "leaking so much" and she noticed that there was blood in her urine. Pt states she does get a "funny feeling" when she voids. Pt states it is "not exactly painful but not comfortable either".

## 2020-09-12 NOTE — Telephone Encounter (Signed)
Spoke to pt's daughter who report swelling has decreased and BP is back WNL. Nurse advised to contact our office is symptoms reoccur. Daughter verbalized understanding.

## 2020-09-13 ENCOUNTER — Emergency Department
Admission: EM | Admit: 2020-09-13 | Discharge: 2020-09-13 | Disposition: A | Discharge status: Home / Self Care | Payer: Medicare Other | Attending: Emergency Medicine | Admitting: Emergency Medicine

## 2020-09-13 DIAGNOSIS — N3091 Cystitis, unspecified with hematuria: Secondary | ICD-10-CM

## 2020-09-13 DIAGNOSIS — N3090 Cystitis, unspecified without hematuria: Secondary | ICD-10-CM | POA: Diagnosis not present

## 2020-09-13 DIAGNOSIS — N3001 Acute cystitis with hematuria: Secondary | ICD-10-CM | POA: Diagnosis not present

## 2020-09-13 LAB — MAGNESIUM: Magnesium: 2.1 mg/dL (ref 1.7–2.4)

## 2020-09-13 MED ORDER — POTASSIUM CHLORIDE 20 MEQ/15ML (10%) PO SOLN
40.0000 meq | Freq: Once | ORAL | Status: AC
  Administered 2020-09-13: 40 meq via ORAL
  Filled 2020-09-13: qty 30

## 2020-09-13 MED ORDER — CEPHALEXIN 500 MG PO CAPS
500.0000 mg | ORAL_CAPSULE | Freq: Once | ORAL | Status: AC
  Administered 2020-09-13: 500 mg via ORAL
  Filled 2020-09-13: qty 1

## 2020-09-13 MED ORDER — CEPHALEXIN 500 MG PO CAPS: 500.0000 mg | ORAL_CAPSULE | Freq: Three times a day (TID) | ORAL | 0 refills | Status: AC

## 2020-09-13 NOTE — Discharge Instructions (Signed)
Please take the Keflex 1 pill 3 times a day for the UTI.  Please return if it is worse or no better in 2  days.  Also your potassium is slightly low.  I am giving you 1 dose here in the emergency room and another dose to take at home.  Please make sure your cardiologist continues to follow-up on your leg swelling and feel free to return if your breathing gets worse than usual.

## 2020-09-13 NOTE — ED Provider Notes (Signed)
[facility] Emergency Department Provider Note   ____________________________________________   None    (approximate)  I have reviewed the triage vital signs and the nursing notes.   HISTORY  Chief Complaint Urinary Urgency and Hematuria   HPI Anita [name] is a 59 y.o. female who complains of urinary frequency and urgency.  She has had blood in her urine.  It does not really feel good when she urinates either.  She has not had a fever or nausea or vomiting.  She is short of breath but the shortness of breath is her baseline now since this past April.  She has some swelling in her left leg but this has been coming and going since April on her cardiologist has evaluated and put her on intermittent furosemide for it.  She is not running a fever.  She is not having any other problems.         Past Medical History:  Diagnosis Date  . Abnormality of gait 07/26/2013  . Acute respiratory distress 03/20/2020  . Acute respiratory failure with hypoxia ([place]) 03/20/2020  . Allergy   . Arthritis   . Atypical pneumonia 03/22/2020  . Chickenpox   . Elevated troponin 03/22/2020  . GERD (gastroesophageal reflux disease)   . Headache(784.0)    Migraine  . Hearing loss    Right ear secondary to infection  . History of shingles   . Migraines   . Multiple sclerosis ([place])   . Obese   . Optic neuritis   . Prolonged QT interval 03/20/2020    Patient Active Problem List   Diagnosis Date Noted  . Vitamin B12 deficiency 08/23/2020  . Iron deficiency anemia 08/23/2020  . COPD (chronic obstructive pulmonary disease) ([place]) 06/22/2020  . Tobacco abuse 03/22/2020  . Acute systolic CHF (congestive heart failure) ([place]) 03/22/2020  . Pulmonary hypertension ([place]) 03/22/2020  . Retroperitoneal mass 03/22/2020  . Insomnia 03/22/2020  . NSTEMI (non-ST elevated myocardial infarction) ([place]) 03/20/2020  . SIRS (systemic inflammatory response syndrome) ([place]) 03/20/2020  .  Normocytic anemia 03/20/2020  . AKI (acute kidney injury) ([place]) 03/20/2020  . Gastroesophageal reflux disease 02/15/2018  . Chronic migraine without aura with status migrainosus, not intractable 02/15/2018  . Osteoarthritis 02/15/2018  . Essential hypertension 02/15/2018  . Disturbance of skin sensation 03/26/2012  . Multiple sclerosis ([place]) 03/26/2012    Past Surgical History:  Procedure Laterality Date  . COLONOSCOPY WITH PROPOFOL N/A 08/03/2020   Procedure: COLONOSCOPY WITH PROPOFOL;  Surgeon: [name], MD;  Location: [facility] ENDOSCOPY;  Service: Gastroenterology;  Laterality: N/A;  . Ganglioneuroma     Resection  . LEFT HEART CATH AND CORONARY ANGIOGRAPHY N/A 03/23/2020   Procedure: LEFT HEART CATH AND CORONARY ANGIOGRAPHY;  Surgeon: Leonie Man, MD;  Location: [facility];  Service: Cardiovascular;  Laterality: N/A;  . PILONIDAL CYST EXCISION    . PILONIDAL CYST EXCISION  1983  . TUMOR REMOVAL  2007/2008    Prior to Admission medications   Medication Sig Start Date End Date Taking? Authorizing Provider  acetaminophen (TYLENOL) 325 MG tablet Take 2 tablets (650 mg total) by mouth every 6 (six) hours as needed for mild pain (or Fever >/= 101). 03/28/20   Barton Dubois, MD  albuterol (VENTOLIN HFA) 108 (90 Base) MCG/ACT inhaler Inhale 2 puffs into the lungs every 6 (six) hours as needed for wheezing or shortness of breath. 08/29/20   [name], NP  aspirin EC 81 MG EC tablet Take 1  tablet (81 mg total) by mouth daily. 03/29/20   Barton Dubois, MD  budesonide-formoterol [facility] 160-4.5 MCG/ACT inhaler Inhale 2 puffs into the lungs 2 (two) times daily.    [provider]  calcium carbonate (TUMS EX) 750 MG chewable tablet Chew 1 tablet by mouth daily as needed for heartburn.    [provider]  carvedilol (COREG) 3.125 MG tablet Take 1 tablet (3.125 mg total) by mouth 2 (two) times daily with a meal. 06/01/20   Cleaver, Jossie Ng, NP  cephALEXin  (KEFLEX) 500 MG capsule Take 1 capsule (500 mg total) by mouth 3 (three) times daily for 10 days. 09/13/20 09/23/20  Nena Polio, MD  cetirizine (ZYRTEC) 10 MG tablet Take 10 mg by mouth daily.    [provider]  Doxylamine Succinate, Sleep, (SLEEP AID PO) Take 1 tablet by mouth at bedtime as needed (sleep).     [provider]  feeding supplement, ENSURE ENLIVE, (ENSURE ENLIVE) LIQD Take 237 mLs by mouth 2 (two) times daily between meals. 03/28/20   Barton Dubois, MD  ferrous sulfate 325 (65 FE) MG EC tablet Take 1 tablet (325 mg total) by mouth 2 (two) times daily with a meal. 08/23/20   Earlie Server, MD  guaiFENesin (MUCINEX) 600 MG 12 hr tablet Take 600 mg by mouth 2 (two) times daily as needed for cough.    [provider]  losartan (COZAAR) 25 MG tablet Take 0.5 tablets (12.5 mg total) by mouth daily. 06/22/20   [name], NP  mirtazapine (REMERON) 7.5 MG tablet TAKE 1 TABLET (7.5 MG TOTAL) BY MOUTH AT BEDTIME. FOR SLEEP AND APPETITE. 08/27/20   [name], NP  Multiple Vitamin (MULTIVITAMIN WITH MINERALS) TABS tablet Take 1 tablet by mouth daily. 03/29/20   Barton Dubois, MD  nitroGLYCERIN (NITROSTAT) 0.4 MG SL tablet as needed. 04/23/20   [provider]  Probiotic Product (PROBIOTIC ADVANCED PO) Take by mouth.    [provider]  rosuvastatin (CRESTOR) 20 MG tablet Take 1 tablet (20 mg total) by mouth daily. For cholesterol. 05/18/20   [name], NP  SUMAtriptan (IMITREX) 25 MG tablet Take 1 tablet by mouth at migraine onset, may repeat in 2 hours if headache persists or recurs. Do not exceed 2 tablets in 24 hours. 02/15/18   [name], NP  torsemide (DEMADEX) 20 MG tablet Take 1 tablet (20 mg total) by mouth daily as needed. For leg or abdomen swelling 08/17/20   Buford Dresser, MD  vitamin B-12 (CYANOCOBALAMIN) 1000 MCG tablet Take 1 tablet (1,000 mcg total) by mouth daily. 08/23/20   Earlie Server, MD     Allergies Acthar hp [corticotropin], Codeine, and Prednisone  Family History  Problem Relation Age of Onset  . Skin cancer Mother   . Lung cancer Father   . Uterine cancer Sister   . Heart disease Brother   . Bipolar disorder Sister   . Throat cancer Paternal Grandmother     Social History Social History   Tobacco Use  . Smoking status: Former Smoker    Packs/day: 1.00    Types: Cigarettes  . Smokeless tobacco: Never Used  Vaping Use  . Vaping Use: Never used  Substance Use Topics  . Alcohol use: Yes    Comment: Occasional  . Drug use: Not on file    Review of Systems  Constitutional: No fever/chills Eyes: No visual changes. ENT: No sore throat. Cardiovascular: Denies chest pain. Respiratory: Denies any change in  her baseline shortness of breath. Gastrointestinal: No abdominal pain.  No nausea, no vomiting.  No diarrhea.  No constipation. Genitourinary: dysuria. Musculoskeletal: Negative for back pain. Skin: Negative for rash. Neurological: Negative for headaches, focal weakness   ____________________________________________   PHYSICAL EXAM:  VITAL SIGNS: ED Triage Vitals  Enc Vitals Group     BP 09/12/20 2258 (!) 150/87     Pulse Rate 09/12/20 2258 85     Resp 09/12/20 2258 20     Temp 09/12/20 2258 97.6 F (36.4 C)     Temp Source 09/12/20 2258 Oral     SpO2 09/12/20 2258 96 %     Weight 09/12/20 2258 130 lb (59 kg)     Height 09/12/20 2258 5\' 3"  (1.6 m)     Head Circumference --      Peak Flow --      Pain Score 09/12/20 2305 0     Pain Loc --      Pain Edu? --      Excl. in [place]? --     Constitutional: Alert and oriented.  Chronically ill appearing and in no acute distress. Eyes: Conjunctivae are normal.  Head: Atraumatic. Nose: No congestion/rhinnorhea. Mouth/Throat: Mucous membranes are moist.  Oropharynx non-erythematous. Neck: No stridor Cardiovascular: Normal rate, regular rhythm. Grossly normal heart sounds.  Good peripheral  circulation. Respiratory: Normal respiratory effort.  No retractions. Lungs occasional scattered crackles Gastrointestinal: Soft and nontender. No distention. No abdominal bruits. No CVA tenderness. Musculoskeletal: No lower extremity tenderness left-sided 2+ edema.   Neurologic: At baseline Skin:  Skin is warm, dry and intact. No rash noted. Psychiatric: Mood and affect are normal. Speech and behavior are normal.  ____________________________________________   LABS (all labs ordered are listed, but only abnormal results are displayed)  Labs Reviewed  CBC WITH DIFFERENTIAL/PLATELET - Abnormal; Notable for the following components:      Result Value   RBC 3.75 (*)    Hemoglobin 9.8 (*)    HCT 32.1 (*)    RDW 18.8 (*)    All other components within normal limits  COMPREHENSIVE METABOLIC PANEL - Abnormal; Notable for the following components:   Potassium 2.5 (*)    BUN 21 (*)    Creatinine, Ser 1.23 (*)    GFR calc non Af Amer 49 (*)    All other components within normal limits  URINALYSIS, COMPLETE (UACMP) WITH MICROSCOPIC - Abnormal; Notable for the following components:   Color, Urine RED (*)    APPearance TURBID (*)    Glucose, UA   (*)    Value: TEST NOT REPORTED DUE TO COLOR INTERFERENCE OF URINE PIGMENT   Hgb urine dipstick   (*)    Value: TEST NOT REPORTED DUE TO COLOR INTERFERENCE OF URINE PIGMENT   Bilirubin Urine   (*)    Value: TEST NOT REPORTED DUE TO COLOR INTERFERENCE OF URINE PIGMENT   Ketones, ur   (*)    Value: TEST NOT REPORTED DUE TO COLOR INTERFERENCE OF URINE PIGMENT   Protein, ur   (*)    Value: TEST NOT REPORTED DUE TO COLOR INTERFERENCE OF URINE PIGMENT   Nitrite   (*)    Value: TEST NOT REPORTED DUE TO COLOR INTERFERENCE OF URINE PIGMENT   Leukocytes,Ua   (*)    Value: TEST NOT REPORTED DUE TO COLOR INTERFERENCE OF URINE PIGMENT   RBC / HPF >50 (*)    WBC, UA >50 (*)    All other components within  normal limits  URINE CULTURE  MAGNESIUM    ____________________________________________  EKG   ____________________________________________  RADIOLOGY Gertha Calkin, personally viewed and evaluated these images (plain radiographs) as part of my medical decision making, as well as reviewing the written report by the radiologist.  ED MD interpretation:    Official radiology report(s): No results found.  ____________________________________________   PROCEDURES  Procedure(s) performed (including Critical Care):  Procedures   ____________________________________________   INITIAL IMPRESSION / ASSESSMENT AND PLAN / ED COURSE  Discussed patient's presentation with her and the patient's daughter on the phone.  Patient is at her baseline.  She is getting the furosemide as I noted in HPI.  Cardiologist has been aware of the swelling in the leg and has evaluated at per the patient and her daughter.  Cardiologist is not worried about DVT.  I reviewed the old records and told the daughter that I did not see any evidence of a ultrasound of the leg being done but again the daughter said that the swelling has been present intermittently since April and has been evaluated.  Cardiologist does not feel it is a DVT.  Since it is chronic I will not evaluated today.  I will treat the patient for what appears to be hemorrhagic cystitis.  She has no CVA pain no symptoms consistent with kidney stone.  She will return if she has any further problems.             ____________________________________________   FINAL CLINICAL IMPRESSION(S) / ED DIAGNOSES  Final diagnoses:  Hemorrhagic cystitis     ED Discharge Orders         Ordered    cephALEXin (KEFLEX) 500 MG capsule  3 times daily        09/13/20 0754          *Please note:  Anita [name] was evaluated in Emergency Department on 09/13/2020 for the symptoms described in the history of present illness. She was evaluated in the context of the global COVID-19  pandemic, which necessitated consideration that the patient might be at risk for infection with the SARS-CoV-2 virus that causes COVID-19. Institutional protocols and algorithms that pertain to the evaluation of patients at risk for COVID-19 are in a state of rapid change based on information released by regulatory bodies including the CDC and federal and state organizations. These policies and algorithms were followed during the patient's care in the ED.  Some ED evaluations and interventions may be delayed as a result of limited staffing during and the pandemic.*   Note:  This document was prepared using Dragon voice recognition software and may include unintentional dictation errors.    Nena Polio, MD 09/13/20 0800

## 2020-09-13 NOTE — ED Notes (Signed)
Pt verbalizes understanding of d/c instructions, medications and follow up 

## 2020-09-14 LAB — URINE CULTURE: Culture: <10000 — AB

## 2020-09-16 ENCOUNTER — Other Ambulatory Visit: Payer: Self-pay | Admitting: Primary Care

## 2020-09-16 DIAGNOSIS — J439 Emphysema, unspecified: Secondary | ICD-10-CM

## 2020-09-17 NOTE — Telephone Encounter (Signed)
Pt last seen 07/02/2020. Ok to refill? No follow up with our office and under treatment from oncology.

## 2020-09-17 NOTE — Telephone Encounter (Signed)
Her albuterol inhaler was just refilled less than a month ago, she should not be out.  This inhaler is really only intended to be used during an emergency/as needed.  Is she using the Symbicort 2 times a day as prescribed? How is her breathing overall?

## 2020-09-18 NOTE — Telephone Encounter (Signed)
Refill was sent by pharmacy not requested by patient she has uses very sparingly and refill not needed at this time.

## 2020-10-05 ENCOUNTER — Inpatient Hospital Stay: Payer: Medicare Other | Attending: Oncology

## 2020-10-05 ENCOUNTER — Other Ambulatory Visit: Payer: Self-pay | Admitting: Oncology

## 2020-10-05 ENCOUNTER — Other Ambulatory Visit: Payer: Self-pay

## 2020-10-05 DIAGNOSIS — D509 Iron deficiency anemia, unspecified: Secondary | ICD-10-CM | POA: Insufficient documentation

## 2020-10-05 DIAGNOSIS — E538 Deficiency of other specified B group vitamins: Secondary | ICD-10-CM | POA: Diagnosis not present

## 2020-10-05 LAB — CBC WITH DIFFERENTIAL/PLATELET
Abs Immature Granulocytes: 0.02 10*3/uL (ref 0.00–0.07)
Basophils Absolute: 0 10*3/uL (ref 0.0–0.1)
Basophils Relative: 1 %
Eosinophils Absolute: 0.1 10*3/uL (ref 0.0–0.5)
Eosinophils Relative: 1 %
HCT: 31.4 % — ABNORMAL LOW (ref 36.0–46.0)
Hemoglobin: 9.8 g/dL — ABNORMAL LOW (ref 12.0–15.0)
Immature Granulocytes: 0 %
Lymphocytes Relative: 15 %
Lymphs Abs: 1 10*3/uL (ref 0.7–4.0)
MCH: 26.7 pg (ref 26.0–34.0)
MCHC: 31.2 g/dL (ref 30.0–36.0)
MCV: 85.6 fL (ref 80.0–100.0)
Monocytes Absolute: 0.6 10*3/uL (ref 0.1–1.0)
Monocytes Relative: 10 %
Neutro Abs: 4.6 10*3/uL (ref 1.7–7.7)
Neutrophils Relative %: 73 %
Platelets: 178 10*3/uL (ref 150–400)
RBC: 3.67 MIL/uL — ABNORMAL LOW (ref 3.87–5.11)
RDW: 19.2 % — ABNORMAL HIGH (ref 11.5–15.5)
WBC: 6.3 10*3/uL (ref 4.0–10.5)
nRBC: 0 % (ref 0.0–0.2)

## 2020-10-05 LAB — IRON AND TIBC
Iron: 72 ug/dL (ref 28–170)
Saturation Ratios: 16 % (ref 10.4–31.8)
TIBC: 447 ug/dL (ref 250–450)
UIBC: 375 ug/dL

## 2020-10-05 LAB — VITAMIN B12: Vitamin B-12: 2843 pg/mL — ABNORMAL HIGH (ref 180–914)

## 2020-10-05 LAB — FERRITIN: Ferritin: 26 ng/mL (ref 11–307)

## 2020-10-08 ENCOUNTER — Encounter: Payer: Self-pay | Admitting: Cardiology

## 2020-10-08 ENCOUNTER — Encounter: Payer: Self-pay | Admitting: Oncology

## 2020-10-08 ENCOUNTER — Inpatient Hospital Stay: Payer: Medicare Other | Attending: Oncology | Admitting: Oncology

## 2020-10-08 ENCOUNTER — Other Ambulatory Visit: Payer: Self-pay

## 2020-10-08 ENCOUNTER — Inpatient Hospital Stay: Payer: Medicare Other

## 2020-10-08 VITALS — BP 130/89 | HR 77 | Temp 98.1°F | Resp 20 | Wt 151.1 lb

## 2020-10-08 DIAGNOSIS — Z8249 Family history of ischemic heart disease and other diseases of the circulatory system: Secondary | ICD-10-CM | POA: Insufficient documentation

## 2020-10-08 DIAGNOSIS — N1831 Chronic kidney disease, stage 3a: Secondary | ICD-10-CM

## 2020-10-08 DIAGNOSIS — R1909 Other intra-abdominal and pelvic swelling, mass and lump: Secondary | ICD-10-CM | POA: Diagnosis not present

## 2020-10-08 DIAGNOSIS — R319 Hematuria, unspecified: Secondary | ICD-10-CM | POA: Insufficient documentation

## 2020-10-08 DIAGNOSIS — Z808 Family history of malignant neoplasm of other organs or systems: Secondary | ICD-10-CM | POA: Diagnosis not present

## 2020-10-08 DIAGNOSIS — I129 Hypertensive chronic kidney disease with stage 1 through stage 4 chronic kidney disease, or unspecified chronic kidney disease: Secondary | ICD-10-CM | POA: Insufficient documentation

## 2020-10-08 DIAGNOSIS — E611 Iron deficiency: Secondary | ICD-10-CM | POA: Diagnosis not present

## 2020-10-08 DIAGNOSIS — Z8049 Family history of malignant neoplasm of other genital organs: Secondary | ICD-10-CM | POA: Diagnosis not present

## 2020-10-08 DIAGNOSIS — Z79899 Other long term (current) drug therapy: Secondary | ICD-10-CM | POA: Diagnosis not present

## 2020-10-08 DIAGNOSIS — I252 Old myocardial infarction: Secondary | ICD-10-CM | POA: Insufficient documentation

## 2020-10-08 DIAGNOSIS — R58 Hemorrhage, not elsewhere classified: Secondary | ICD-10-CM | POA: Diagnosis not present

## 2020-10-08 DIAGNOSIS — Z87891 Personal history of nicotine dependence: Secondary | ICD-10-CM | POA: Insufficient documentation

## 2020-10-08 DIAGNOSIS — Z885 Allergy status to narcotic agent status: Secondary | ICD-10-CM | POA: Insufficient documentation

## 2020-10-08 DIAGNOSIS — E538 Deficiency of other specified B group vitamins: Secondary | ICD-10-CM | POA: Diagnosis not present

## 2020-10-08 DIAGNOSIS — I2510 Atherosclerotic heart disease of native coronary artery without angina pectoris: Secondary | ICD-10-CM | POA: Insufficient documentation

## 2020-10-08 DIAGNOSIS — R110 Nausea: Secondary | ICD-10-CM | POA: Diagnosis not present

## 2020-10-08 DIAGNOSIS — I5043 Acute on chronic combined systolic (congestive) and diastolic (congestive) heart failure: Secondary | ICD-10-CM | POA: Insufficient documentation

## 2020-10-08 DIAGNOSIS — Z888 Allergy status to other drugs, medicaments and biological substances status: Secondary | ICD-10-CM | POA: Insufficient documentation

## 2020-10-08 DIAGNOSIS — I5042 Chronic combined systolic (congestive) and diastolic (congestive) heart failure: Secondary | ICD-10-CM | POA: Insufficient documentation

## 2020-10-08 DIAGNOSIS — R3915 Urgency of urination: Secondary | ICD-10-CM | POA: Insufficient documentation

## 2020-10-08 DIAGNOSIS — I272 Pulmonary hypertension, unspecified: Secondary | ICD-10-CM | POA: Insufficient documentation

## 2020-10-08 DIAGNOSIS — I255 Ischemic cardiomyopathy: Secondary | ICD-10-CM | POA: Insufficient documentation

## 2020-10-08 DIAGNOSIS — R5383 Other fatigue: Secondary | ICD-10-CM | POA: Insufficient documentation

## 2020-10-08 DIAGNOSIS — Z818 Family history of other mental and behavioral disorders: Secondary | ICD-10-CM | POA: Diagnosis not present

## 2020-10-08 DIAGNOSIS — K648 Other hemorrhoids: Secondary | ICD-10-CM | POA: Insufficient documentation

## 2020-10-08 DIAGNOSIS — D631 Anemia in chronic kidney disease: Secondary | ICD-10-CM | POA: Diagnosis not present

## 2020-10-08 DIAGNOSIS — Z801 Family history of malignant neoplasm of trachea, bronchus and lung: Secondary | ICD-10-CM | POA: Insufficient documentation

## 2020-10-08 DIAGNOSIS — D509 Iron deficiency anemia, unspecified: Secondary | ICD-10-CM

## 2020-10-08 HISTORY — DX: Acute on chronic combined systolic (congestive) and diastolic (congestive) heart failure: I50.43

## 2020-10-08 NOTE — Progress Notes (Signed)
Hematology/Oncology Consult note [facility] [phone] [phone]   Patient Care Team: [name], NP as PCP - General (Internal Medicine) Buford Dresser, MD as PCP - Cardiology (Cardiology)  REFERRING PROVIDER: [name], NP  CHIEF COMPLAINTS/REASON FOR VISIT:  Evaluation of anemia  HISTORY OF PRESENTING ILLNESS:  Anita [name] is a  59 y.o.  female with PMH listed below who was referred to me for evaluation of anemia Reviewed patient's recent labs that was done.  05/03/2020 labs revealed anemia with hemoglobin of 8.9, MCV 82.9 07/02/2020, iron saturation 11, ferritin 91, TIBC 307, iron 34. Reviewed patient's previous labs , anemia is chronic onset , duration is since April 2021.  She did not have much baseline blood work prior to April 2021.  She did have some remote blood work that was done in 2012 when she had a normal hemoglobin of 13.5. Patient has multiple medical problems.  She is a poor historian.  Daughter provides most of her 73. Patient was hospitalized in April 2021 due to acute respiratory failure with hypoxia, due to atypical pneumonia, non-STEMI, severe three-vessel disease and pulmonary hypertension, AKI. 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 Eston Esters in 2007.  Patient declined pursuing further diagnosis or therapeutic options. She also has a chronic history of MS.  Patient was recently seen by gastroenterology and had colonoscopy done. 5 mm transverse colon polyp which was resected and retrieved.  Diverticulosis.  Nonbleeding internal hemorrhoids. Patient denies any bloody or black bowel movement.  She continues to have chronic nausea with no vomiting. Appetite is not good.  Weight has been relatively stable.  She also reports bilateral upper extremity ecchymosis which is a  chronic issue for her.    INTERVAL HISTORY Anita [name] is a 59 y.o. female who has above history reviewed by me today presents for follow up visit for management of Anemia Problems and complaints are listed below: Recent ED visit on 09/13/2020 due to urinary urgency and hematuria.  Patient was recommended to take a course of Keflex. Today patient was accompanied by her daughter.  Chronic fatigue unchanged.  Review of Systems  Constitutional: Positive for appetite change and fatigue. Negative for chills, fever and unexpected weight change.  HENT:   Negative for hearing loss and voice change.   Eyes: Negative for eye problems.  Respiratory: Negative for chest tightness and cough.   Cardiovascular: Negative for chest pain.  Gastrointestinal: Negative for abdominal distention, abdominal pain, blood in stool and nausea.  Endocrine: Negative for hot flashes.  Genitourinary: Negative for difficulty urinating and frequency.   Musculoskeletal: Negative for arthralgias.  Skin: Negative for itching and rash.  Neurological: Negative for extremity weakness.  Hematological: Negative for adenopathy. Bruises/bleeds easily.  Psychiatric/Behavioral: Negative for confusion.     MEDICAL HISTORY:  Past Medical History:  Diagnosis Date  . Abnormality of gait 07/26/2013  . Acute respiratory distress 03/20/2020  . Acute respiratory failure with hypoxia ([place]) 03/20/2020  . Allergy   . Arthritis   . Atypical pneumonia 03/22/2020  . Chickenpox   . Elevated troponin 03/22/2020  . GERD (gastroesophageal reflux disease)   . Headache(784.0)    Migraine  . Hearing loss    Right ear secondary to infection  . History of shingles   . Migraines   . Multiple sclerosis ([place])   . Obese   .  Optic neuritis   . Prolonged QT interval 03/20/2020    SURGICAL HISTORY: Past Surgical History:  Procedure Laterality Date  . COLONOSCOPY WITH PROPOFOL N/A 08/03/2020   Procedure: COLONOSCOPY WITH PROPOFOL;   Surgeon: [name], MD;  Location: [facility] ENDOSCOPY;  Service: Gastroenterology;  Laterality: N/A;  . Ganglioneuroma     Resection  . LEFT HEART CATH AND CORONARY ANGIOGRAPHY N/A 03/23/2020   Procedure: LEFT HEART CATH AND CORONARY ANGIOGRAPHY;  Surgeon: Leonie Man, MD;  Location: [place] [facility];  Service: Cardiovascular;  Laterality: N/A;  . PILONIDAL CYST EXCISION    . PILONIDAL CYST EXCISION  1983  . TUMOR REMOVAL  2007/2008    SOCIAL HISTORY: Social History   Socioeconomic History  . Marital status: Divorced    Spouse name: Not on file  . Number of children: Not on file  . Years of education: Not on file  . Highest education level: Not on file  Occupational History  . Not on file  Tobacco Use  . Smoking status: Former Smoker    Packs/day: 1.00    Types: Cigarettes  . Smokeless tobacco: Never Used  Vaping Use  . Vaping Use: Never used  Substance and Sexual Activity  . Alcohol use: Yes    Comment: Occasional  . Drug use: Not on file  . Sexual activity: Not on file  Other Topics Concern  . Not on file  Social History Narrative  . Not on file   Social Determinants of Health   Financial Resource Strain:   . Difficulty of Paying Living Expenses: Not on file  Food Insecurity:   . Worried About Charity fundraiser in the Last Year: Not on file  . Ran Out of Food in the Last Year: Not on file  Transportation Needs:   . Lack of Transportation (Medical): Not on file  . Lack of Transportation (Non-Medical): Not on file  Physical Activity:   . Days of Exercise per Week: Not on file  . Minutes of Exercise per Session: Not on file  Stress:   . Feeling of Stress : Not on file  Social Connections:   . Frequency of Communication with Friends and Family: Not on file  . Frequency of Social Gatherings with Friends and Family: Not on file  . Attends Religious Services: Not on file  . Active Member of Clubs or Organizations: Not on file  . Attends Archivist  Meetings: Not on file  . Marital Status: Not on file  Intimate Partner Violence:   . Fear of Current or Ex-Partner: Not on file  . Emotionally Abused: Not on file  . Physically Abused: Not on file  . Sexually Abused: Not on file    FAMILY HISTORY: Family History  Problem Relation Age of Onset  . Skin cancer Mother   . Lung cancer Father   . Uterine cancer Sister   . Heart disease Brother   . Bipolar disorder Sister   . Throat cancer Paternal Grandmother     ALLERGIES:  is allergic to acthar hp [corticotropin], codeine, and prednisone.  MEDICATIONS:  Current Outpatient Medications  Medication Sig Dispense Refill  . acetaminophen (TYLENOL) 325 MG tablet Take 2 tablets (650 mg total) by mouth every 6 (six) hours as needed for mild pain (or Fever >/= 101).    Marland Kitchen albuterol (VENTOLIN HFA) 108 (90 Base) MCG/ACT inhaler Inhale 2 puffs into the lungs every 6 (six) hours as needed for wheezing or shortness of breath. [place]  g 0  . aspirin EC 81 MG EC tablet Take 1 tablet (81 mg total) by mouth daily.    . budesonide-formoterol (SYMBICORT) 160-4.5 MCG/ACT inhaler Inhale 2 puffs into the lungs 2 (two) times daily.    . calcium carbonate (TUMS EX) 750 MG chewable tablet Chew 1 tablet by mouth daily as needed for heartburn.    . carvedilol (COREG) 3.125 MG tablet Take 1 tablet (3.125 mg total) by mouth 2 (two) times daily with a meal. 60 tablet 6  . cetirizine (ZYRTEC) 10 MG tablet Take 10 mg by mouth daily.    . Doxylamine Succinate, Sleep, (SLEEP AID PO) Take 1 tablet by mouth at bedtime as needed (sleep).     . feeding supplement, ENSURE ENLIVE, (ENSURE ENLIVE) LIQD Take 237 mLs by mouth 2 (two) times daily between meals.    . ferrous sulfate 325 (65 FE) MG EC tablet Take 1 tablet (325 mg total) by mouth 2 (two) times daily with a meal. 60 tablet 3  . guaiFENesin (MUCINEX) 600 MG 12 hr tablet Take 600 mg by mouth 2 (two) times daily as needed for cough.    . losartan (COZAAR) 25 MG tablet Take  0.5 tablets (12.5 mg total) by mouth daily. 45 tablet 1  . mirtazapine (REMERON) 7.5 MG tablet TAKE 1 TABLET (7.5 MG TOTAL) BY MOUTH AT BEDTIME. FOR SLEEP AND APPETITE. 90 tablet 0  . Multiple Vitamin (MULTIVITAMIN WITH MINERALS) TABS tablet Take 1 tablet by mouth daily.    . nitroGLYCERIN (NITROSTAT) 0.4 MG SL tablet as needed.    . Probiotic Product (PROBIOTIC ADVANCED PO) Take by mouth.    . rosuvastatin (CRESTOR) 20 MG tablet Take 1 tablet (20 mg total) by mouth daily. For cholesterol. 90 tablet 3  . SUMAtriptan (IMITREX) 25 MG tablet Take 1 tablet by mouth at migraine onset, may repeat in 2 hours if headache persists or recurs. Do not exceed 2 tablets in 24 hours. 10 tablet 0  . torsemide (DEMADEX) 20 MG tablet Take 1 tablet (20 mg total) by mouth daily as needed. For leg or abdomen swelling 30 tablet 11  . vitamin B-12 (CYANOCOBALAMIN) 1000 MCG tablet Take 1 tablet (1,000 mcg total) by mouth daily. 90 tablet 0   No current facility-administered medications for this visit.     PHYSICAL EXAMINATION: ECOG PERFORMANCE STATUS: 2 - Symptomatic, <50% confined to bed Vitals:   10/08/20 1323  BP: 130/89  Pulse: 77  Resp: 20  Temp: 98.1 F (36.7 C)   Filed Weights   10/08/20 1323  Weight: 151 lb 1.6 oz (68.5 kg)    Physical Exam Constitutional:      General: She is not in acute distress. HENT:     Head: Normocephalic and atraumatic.  Eyes:     General: No scleral icterus. Cardiovascular:     Rate and Rhythm: Normal rate and regular rhythm.     Heart sounds: Murmur heard.   Pulmonary:     Effort: Pulmonary effort is normal. No respiratory distress.     Breath sounds: No wheezing.  Abdominal:     General: Bowel sounds are normal. There is no distension.     Palpations: Abdomen is soft.  Musculoskeletal:        General: No deformity. Normal range of motion.     Cervical back: Normal range of motion and neck supple.  Skin:    General: Skin is warm and dry.     Coloration:  Skin is pale.  Comments: Bilateral upper extremity ecchymosis.  Neurological:     Mental Status: She is alert and oriented to person, place, and time. Mental status is at baseline.  Psychiatric:        Mood and Affect: Mood normal.      LABORATORY DATA:  I have reviewed the data as listed Lab Results  Component Value Date   WBC 6.3 10/05/2020   HGB 9.8 (L) 10/05/2020   HCT 31.4 (L) 10/05/2020   MCV 85.6 10/05/2020   PLT 178 10/05/2020   Recent Labs    03/20/20 0826 03/20/20 1701 03/27/20 0849 03/27/20 0849 03/28/20 0254 05/03/20 1055 06/22/20 1550 08/14/20 1150 09/12/20 2309  NA  --    < > 135   < > 135   < > 139 136 140  K  --    < > 4.7   < > 5.1   < > 4.0 3.2* 2.5*  CL  --    < > 105   < > 105   < > 108 101 100  CO2  --    < > 17*   < > 21*   < > 18* 24 29  GLUCOSE  --    < > 142*   < > 116*   < > 84 102* 92  BUN  --    < > 68*   < > 68*   < > 17 14 21*  CREATININE  --    < > 1.58*   < > 1.51*   < > 1.26* 1.04* 1.23*  CALCIUM  --    < > 9.2   < > 9.4   < > 9.1 9.0 9.5  GFRNONAA  --    < > 36*   < > 38*  --   --  60* 49*  GFRAA  --    < > 42*  --  44*  --   --  >60  --   PROT 6.2*   < > 6.0*   < > 6.5  --  6.4 6.6 7.1  ALBUMIN 3.1*   < > 2.9*   < > 3.0*  --   --  3.6 4.0  AST 18   < > 138*   < > 108*  --  12 22 22   ALT 11   < > 225*   < > 208*  --  6 16 14   ALKPHOS 56   < > 165*   < > 235*  --   --  55 47  BILITOT 0.7   < > 0.6   < > 0.8  --  0.5 0.6 1.0  BILIDIR 0.1  --   --   --   --   --   --   --   --   IBILI 0.6  --   --   --   --   --   --   --   --    < > = values in this interval not displayed.   Iron/TIBC/Ferritin/ %Sat    Component Value Date/Time   IRON 72 10/05/2020 1342   IRON 34 07/02/2020 1620   TIBC 447 10/05/2020 1342   TIBC 307 07/02/2020 1620   FERRITIN 26 10/05/2020 1342   FERRITIN 91 07/02/2020 1620   IRONPCTSAT 16 10/05/2020 1342   IRONPCTSAT 11 (L) 07/02/2020 1620        ASSESSMENT & PLAN:  1. Iron deficiency anemia,  unspecified iron deficiency anemia  type   2. Vitamin B12 deficiency   3. Anemia due to stage 3a chronic kidney disease (HCC)    #Anemia, combination of iron deficiency as well as chronic kidney disease. Labs are reviewed and discussed with patient. Her hemoglobin has improved comparing to last visit. Iron store has also improved. We further discussed about the option of IV Venofer treatments which may further increase her iron store as well as hemoglobin level for the small risk of severe allergic reaction. Patient prefers to continue oral iron supplementation ferrous sulfate 325 mg twice daily with meals.  Vitamin B12 deficiency, patient has been taking oral vitamin B12 1000 MCG daily. B12 level was rechecked and was elevated.  I recommend patient to switch to vitamin B12 twice weekly.   Orders Placed This Encounter  Procedures  . CBC with Differential/Platelet    Standing Status:   Future    Standing Expiration Date:   10/08/2021  . Ferritin    Standing Status:   Future    Standing Expiration Date:   10/08/2021  . Iron and TIBC    Standing Status:   Future    Standing Expiration Date:   10/08/2021  . Vitamin B12    Standing Status:   Future    Standing Expiration Date:   10/08/2021    All questions were answered. The patient knows to call the clinic with any problems questions or concerns. Roberts Gaudy, NP  Return of visit: 3 months Thank you for this kind referral and the opportunity to participate in the care of this patient. A copy of today's note is routed to referring provider    Earlie Server, MD, PhD 10/08/2020

## 2020-10-08 NOTE — Progress Notes (Signed)
Patient denies new problems/concerns today.   °

## 2020-10-27 ENCOUNTER — Other Ambulatory Visit: Payer: Self-pay | Admitting: Primary Care

## 2020-10-27 DIAGNOSIS — J439 Emphysema, unspecified: Secondary | ICD-10-CM

## 2020-11-16 ENCOUNTER — Ambulatory Visit: Payer: Medicare Other | Admitting: Cardiology

## 2020-11-21 ENCOUNTER — Other Ambulatory Visit: Payer: Self-pay | Admitting: Primary Care

## 2020-11-21 DIAGNOSIS — G4700 Insomnia, unspecified: Secondary | ICD-10-CM

## 2020-11-21 DIAGNOSIS — R630 Anorexia: Secondary | ICD-10-CM

## 2020-12-14 ENCOUNTER — Encounter: Payer: Self-pay | Admitting: Cardiology

## 2020-12-14 ENCOUNTER — Other Ambulatory Visit: Payer: Self-pay

## 2020-12-14 ENCOUNTER — Ambulatory Visit (INDEPENDENT_AMBULATORY_CARE_PROVIDER_SITE_OTHER): Payer: Medicare Other | Admitting: Cardiology

## 2020-12-14 VITALS — BP 160/88 | HR 78 | Ht 63.0 in | Wt 163.6 lb

## 2020-12-14 DIAGNOSIS — R0602 Shortness of breath: Secondary | ICD-10-CM | POA: Diagnosis not present

## 2020-12-14 DIAGNOSIS — E7800 Pure hypercholesterolemia, unspecified: Secondary | ICD-10-CM

## 2020-12-14 DIAGNOSIS — I255 Ischemic cardiomyopathy: Secondary | ICD-10-CM

## 2020-12-14 DIAGNOSIS — Z79899 Other long term (current) drug therapy: Secondary | ICD-10-CM | POA: Diagnosis not present

## 2020-12-14 DIAGNOSIS — I5043 Acute on chronic combined systolic (congestive) and diastolic (congestive) heart failure: Secondary | ICD-10-CM

## 2020-12-14 DIAGNOSIS — I340 Nonrheumatic mitral (valve) insufficiency: Secondary | ICD-10-CM

## 2020-12-14 DIAGNOSIS — I2510 Atherosclerotic heart disease of native coronary artery without angina pectoris: Secondary | ICD-10-CM

## 2020-12-14 NOTE — Progress Notes (Signed)
Cardiology Office Note:    Date:  12/14/2020   ID:  Anita [name], DOB 1961-07-06, MRN [id number]  PCP:  [name], NP  Cardiologist:  Buford Dresser, MD  Referring MD: [name], NP   CC: follow up  History of Present Illness:    Anita [name] is a 60 y.o. female with history of multiple sclerosis, CAD. I met her in the hospital 03/2020, see workup below  Today: Here with her daughter today. Overall feels poorly today. Has intermittent shortness of breath. Using rescue inhaler 2-3 times/day or more. Unclear how long she has been using the rescue inhaler like this; is also using her symbicort. No fevers, chills, sputum. Just feels short of breath and wheezy. Not clearly related to position; minimal exertion at baseline.  Hasn't taken fluid pill in a long time; doesn't like that it makes her go to the bathroom. Difficult for her to weigh at home. Has 3+ pitting edema to thighs and abdominal swelling today. She thinks her swelling has been even worse in recent weeks, especially when she went to the beach.   ROS also positive for memory/concentration issues for the last few months. Feels better after she eats.   Denies chest pain. No PND, orthopnea . No syncope or palpitations.   Past Medical History:  Diagnosis Date  . Abnormality of gait 07/26/2013  . Acute respiratory distress 03/20/2020  . Acute respiratory failure with hypoxia ([place]) 03/20/2020  . Allergy   . Arthritis   . Atypical pneumonia 03/22/2020  . Chickenpox   . Elevated troponin 03/22/2020  . GERD (gastroesophageal reflux disease)   . Headache(784.0)    Migraine  . Hearing loss    Right ear secondary to infection  . History of shingles   . Migraines   . Multiple sclerosis ([place])   . Obese   . Optic neuritis   . Prolonged QT interval 03/20/2020    Past Surgical History:  Procedure Laterality Date  . COLONOSCOPY WITH PROPOFOL N/A 08/03/2020   Procedure: COLONOSCOPY WITH  PROPOFOL;  Surgeon: [name], MD;  Location: [facility] ENDOSCOPY;  Service: Gastroenterology;  Laterality: N/A;  . Ganglioneuroma     Resection  . LEFT HEART CATH AND CORONARY ANGIOGRAPHY N/A 03/23/2020   Procedure: LEFT HEART CATH AND CORONARY ANGIOGRAPHY;  Surgeon: Leonie Man, MD;  Location: [place] [facility];  Service: Cardiovascular;  Laterality: N/A;  . PILONIDAL CYST EXCISION    . PILONIDAL CYST EXCISION  1983  . TUMOR REMOVAL  2007/2008    Current Medications: Current Outpatient Medications on File Prior to Visit  Medication Sig  . acetaminophen (TYLENOL) 325 MG tablet Take 2 tablets (650 mg total) by mouth every 6 (six) hours as needed for mild pain (or Fever >/= 101).  Marland Kitchen albuterol (VENTOLIN HFA) 108 (90 Base) MCG/ACT inhaler TAKE 2 PUFFS BY MOUTH EVERY 6 HOURS AS NEEDED FOR WHEEZE OR SHORTNESS OF BREATH  . aspirin EC 81 MG EC tablet Take 1 tablet (81 mg total) by mouth daily.  . budesonide-formoterol (SYMBICORT) 160-4.5 MCG/ACT inhaler Inhale 2 puffs into the lungs 2 (two) times daily.  . calcium carbonate (TUMS EX) 750 MG chewable tablet Chew 1 tablet by mouth daily as needed for heartburn.  . carvedilol (COREG) 3.125 MG tablet Take 1 tablet (3.125 mg total) by mouth 2 (two) times daily with a meal.  . cetirizine (ZYRTEC) 10 MG tablet Take 10 mg by mouth daily.  . Doxylamine Succinate, Sleep, (SLEEP AID  PO) Take 1 tablet by mouth at bedtime as needed (sleep).   . feeding supplement, ENSURE ENLIVE, (ENSURE ENLIVE) LIQD Take 237 mLs by mouth 2 (two) times daily between meals.  . ferrous sulfate 325 (65 FE) MG EC tablet Take 1 tablet (325 mg total) by mouth 2 (two) times daily with a meal.  . guaiFENesin (MUCINEX) 600 MG 12 hr tablet Take 600 mg by mouth 2 (two) times daily as needed for cough.  . losartan (COZAAR) 25 MG tablet Take 0.5 tablets (12.5 mg total) by mouth daily.  . mirtazapine (REMERON) 7.5 MG tablet TAKE 1 TABLET (7.5 MG TOTAL) BY MOUTH AT BEDTIME. FOR SLEEP AND  APPETITE.  . Multiple Vitamin (MULTIVITAMIN WITH MINERALS) TABS tablet Take 1 tablet by mouth daily.  . nitroGLYCERIN (NITROSTAT) 0.4 MG SL tablet as needed.  . Probiotic Product (PROBIOTIC ADVANCED PO) Take by mouth.  . rosuvastatin (CRESTOR) 20 MG tablet Take 1 tablet (20 mg total) by mouth daily. For cholesterol.  . SUMAtriptan (IMITREX) 25 MG tablet Take 1 tablet by mouth at migraine onset, may repeat in 2 hours if headache persists or recurs. Do not exceed 2 tablets in 24 hours.  . torsemide (DEMADEX) 20 MG tablet Take 1 tablet (20 mg total) by mouth daily as needed. For leg or abdomen swelling  . vitamin B-12 (CYANOCOBALAMIN) 1000 MCG tablet Take 1,000 mcg by mouth 2 (two) times a week.   No current facility-administered medications on file prior to visit.     Allergies:   Acthar hp [corticotropin], Codeine, and Prednisone   Social History   Tobacco Use  . Smoking status: Former Smoker    Packs/day: 1.00    Types: Cigarettes  . Smokeless tobacco: Never Used  Vaping Use  . Vaping Use: Never used  Substance Use Topics  . Alcohol use: Yes    Comment: Occasional    Family History: family history includes Bipolar disorder in her sister; Heart disease in her brother; Lung cancer in her father; Skin cancer in her mother; Throat cancer in her paternal grandmother; Uterine cancer in her sister.  ROS:   Please see the history of present illness.  Additional pertinent ROS otherwise unremarkable.    EKGs/Labs/Other Studies Reviewed:    The following studies were reviewed today: Echo 03/20/20 1. Left ventricular ejection fraction, by estimation, is 40 to 45%. The  left ventricle has mildly decreased function. The left ventricle  demonstrates global hypokinesis. The left ventricular internal cavity size  was mildly dilated. Left ventricular  diastolic function could not be evaluated.  2. Right ventricular systolic function is normal. The right ventricular  size is normal. There  is moderately elevated pulmonary artery systolic  pressure.  3. Left atrial size was mildly dilated.  4. Moderate mitral subvalvular thickening/fibrosis.  5. The mitral valve is rheumatic. Moderate to severe mitral valve  regurgitation. Mild mitral stenosis. The mean mitral valve gradient is 7.3  mmHg with average heart rate of 105 bpm.  6. The aortic valve is normal in structure. Aortic valve regurgitation is  not visualized.  7. The inferior vena cava is dilated in size with <50% respiratory  variability, suggesting right atrial pressure of 15 mmHg.  Cath 03/23/20  Hemodynamics: LV end diastolic pressure is moderately elevated.  ----Coronary Angiography-----  Ost RCA to Dist RCA lesion is 100% stenosed.-The PDA and PL system fills via faint collaterals from the AV groove LCx and LAD septals.  Mid LAD-1 lesion is 60% stenosed. Mid LAD-2 lesion  is 50% stenosed. Dist LAD lesion is 55% stenosed with 70% stenosed side branch in 2nd Diag.  1st Diag lesion is 70% stenosed.  Ost Cx to Prox Cx lesion is 45% stenosed. Prox-MID Cx lesion is 65% stenosed.   MODERATE-SEVERE THREE-VESSEL CAD: ? 100% proximal RCA occlusion with left-to-right collaterals faintly filling PDA and PL system (AV groove LCx-PL and LAD septal-PDA) ? Diffuse moderate mid LAD disease with 60% to 50% stenosis. ? Tandem 50% and 65% proximal and mid LCx with the 65% lesion being the most significant lesion.  Moderately elevated LVEDP of 18-20 mmHg  Given the extent of disease in the LAD and LCx, neither lesion is very amenable to PCI, and RCA is chronically occluded. Given her comorbidities, I do not think she would be a good CABG candidate especially in light of the fact that she would likely require valve surgery as well. She would not recover well.  Recommendations aggressive medical management.  EKG:  EKG is personally reviewed.  The ekg ordered 03/22/20 demonstrates sinus tachycardia with nonspecific ST  pattern  Recent Labs: 03/20/2020: B Natriuretic Peptide 1,255.6 08/14/2020: TSH 1.437 09/12/2020: ALT 14; BUN 21; Creatinine, Ser 1.23; Potassium 2.5; Sodium 140 09/13/2020: Magnesium 2.1 10/05/2020: Hemoglobin 9.8; Platelets 178  Recent Lipid Panel    Component Value Date/Time   TRIG 80 03/20/2020 0743    Physical Exam:    VS:  BP (!) 160/88   Pulse 78   Ht '5\' 3"'  (1.6 m)   Wt 163 lb 9.6 oz (74.2 kg)   SpO2 97%   BMI 28.98 kg/m     Wt Readings from Last 3 Encounters:  12/14/20 163 lb 9.6 oz (74.2 kg)  10/08/20 151 lb 1.6 oz (68.5 kg)  09/12/20 130 lb (59 kg)    GEN: Well nourished, well developed in no acute distress HEENT: Normal, moist mucous membranes NECK: No JVD sitting upright CARDIAC: regular rhythm, normal S1 and S2, no rubs or gallops. 3/6 holosystolic murmur. VASCULAR: Radial and DP pulses 2+ bilaterally. No carotid bruits RESPIRATORY:  Clear to auscultation without rales, wheezing or rhonchi. There is atelectasis at bases with normal breathing, improves with deep inspiration ABDOMEN: Soft, non-tender, non-distended MUSCULOSKELETAL:  In wheelchair but moves all 4 limbs independently SKIN: Warm and dry. Bilateral LE pitting edema to thighs NEUROLOGIC:  Alert and oriented x 3. PSYCHIATRIC:  Normal affect    ASSESSMENT:    1. Acute on chronic combined systolic and diastolic congestive heart failure ([place])   2. Medication management   3. Ischemic cardiomyopathy   4. Coronary artery disease involving native coronary artery of native heart without angina pectoris   5. Mitral valve insufficiency, unspecified etiology   6. Pure hypercholesterolemia   7. Shortness of breath    PLAN:    Cardiomyopathy, likely ischemic Chronic systolic and diastolic heart failure, with acute exacerbation today Shortness of breath CAD with CTO RCA -tolerating aspirin 81 mg daily -continue rosuvastatin 20 mg daily for hypercholesterolemia -has traditionally had lower blood pressure  at our visits, elevated today. Suspect BP will fall with diuresis -tolerating carvedilol 3.125 mg BID and losartan 12.5 mg daily -given significant edema, will use daily torsemide for the next 6 days. Daughter will message me next week to make sure she is improving -her exam today is unremarkable in her lungs/JVD. Her edema is out of proportion to her other physical exam findings.  -her shortness of breath is intermittent, no clear triggers. Her exam initially noted atelectasis that improved with  deep breath. She plans to follow up with her PCP re: her inhaleres  Moderate to severe mitral regurgitation with rheumatic appearing mitral valve, without severe mitral stenosis -thickened leaflets with rheumatic movement -MVA >3 based on pressure half time -her edema is worse, but will try to manage with diuretics. If does not improve, may need to consider TEE, but would need to make sure respiratory status is optimized first. I think she would be high risk for CABG, so question would be if she is a possible mitraclip candidate  Plan for follow up: my chart check in next week, if improving follow up in 1 month. Will need BMET either with me or her PCP prior to follow up visit  Total time of encounter: 41 minutes total time of encounter, including 27 minutes spent in face-to-face patient care. This time includes coordination of care and counseling regarding heart failure education. Remainder of non-face-to-face time involved reviewing chart documents/testing relevant to the patient encounter and documentation in the medical record. High risk for clinical deterioration.  Buford Dresser, MD, PhD, [facility]   Medication Adjustments/Labs and Tests Ordered: Current medicines are reviewed at length with the patient today.  Concerns regarding medicines are outlined above.  Orders Placed This Encounter  Procedures  . Basic metabolic panel   No orders of the defined types were  placed in this encounter.   Patient Instructions  Medication Instructions:  Take Torsemide 20 mg daily for 6 days.  Send me a Pharmacist, community message with the following next week: -is swelling better -is breathing better -was she able to get an appt with Belenda Cruise  *If you need a refill on your cardiac medications before your next appointment, please call your pharmacy*   Lab Work: I would like kidney function checked; either with Belenda Cruise or here at the office in the next 1-2 weeks.  If you have labs (blood work) drawn today and your tests are completely normal, you will receive your results only by: Marland Kitchen MyChart Message (if you have MyChart) OR . A paper copy in the mail If you have any lab test that is abnormal or we need to change your treatment, we will call you to review the results.   Testing/Procedures: None   Follow-Up: At [facility], you and your health needs are our priority.  As part of our continuing mission to provide you with exceptional heart care, we have created designated Provider Care Teams.  These Care Teams include your primary Cardiologist (physician) and Advanced Practice Providers (APPs -  Physician Assistants and Nurse Practitioners) who all work together to provide you with the care you need, when you need it.  We recommend signing up for the patient portal called "MyChart".  Sign up information is provided on this After Visit Summary.  MyChart is used to connect with patients for Virtual Visits (Telemedicine).  Patients are able to view lab/test results, encounter notes, upcoming appointments, etc.  Non-urgent messages can be sent to your provider as well.   To learn more about what you can do with MyChart, go to [email or web address].    Your next appointment:   1 month(s)  The format for your next appointment:   In Person  Provider:   Buford Dresser, MD     Signed, Buford Dresser, MD PhD 12/14/2020    [place]

## 2020-12-14 NOTE — Patient Instructions (Addendum)
Medication Instructions:  Take Torsemide 20 mg daily for 6 days.  Send me a Pharmacist, community message with the following next week: -is swelling better -is breathing better -was she able to get an appt with Belenda Cruise  *If you need a refill on your cardiac medications before your next appointment, please call your pharmacy*   Lab Work: I would like kidney function checked; either with Belenda Cruise or here at the office in the next 1-2 weeks.  If you have labs (blood work) drawn today and your tests are completely normal, you will receive your results only by: Marland Kitchen MyChart Message (if you have MyChart) OR . A paper copy in the mail If you have any lab test that is abnormal or we need to change your treatment, we will call you to review the results.   Testing/Procedures: None   Follow-Up: At [facility], you and your health needs are our priority.  As part of our continuing mission to provide you with exceptional heart care, we have created designated Provider Care Teams.  These Care Teams include your primary Cardiologist (physician) and Advanced Practice Providers (APPs -  Physician Assistants and Nurse Practitioners) who all work together to provide you with the care you need, when you need it.  We recommend signing up for the patient portal called "MyChart".  Sign up information is provided on this After Visit Summary.  MyChart is used to connect with patients for Virtual Visits (Telemedicine).  Patients are able to view lab/test results, encounter notes, upcoming appointments, etc.  Non-urgent messages can be sent to your provider as well.   To learn more about what you can do with MyChart, go to [email or web address].    Your next appointment:   1 month(s)  The format for your next appointment:   In Person  Provider:   Buford Dresser, MD

## 2020-12-19 ENCOUNTER — Telehealth: Payer: Self-pay | Admitting: Primary Care

## 2020-12-19 NOTE — Telephone Encounter (Signed)
Noted.   Please instruct patient to use her budesonide-formoterol (Symbicort), 2 puffs into the lungs twice daily. This is used EVERYDAY!  Please stress to her that she should be reserving the albuterol inhaler only if she is very short of breath and feels she needs help to breathe.  If she continues to use the albuterol when she does not need it, then it will not be effective when she actually does need it.

## 2020-12-19 NOTE — Telephone Encounter (Signed)
Patient's daughter,Cindy,called.  Jenny Reichmann said patient saw Dr.Christopher,Cardiology, on 12/14/20. Dr.Christopher wanted patient to follow up with Anda Kraft about patient's retaining fluid,labored breathing and check her kidney function.  Dr.Christopher put patient on fluid pill. It helped, but patient has been using her rescue inhaler more than she's using the regular inhaler, sometimes daily.  I offered an appointment on 12/21/20, but Jenny Reichmann can't get off work until February.  We scheduled appointment on 01/08/21. Cindy's aware if patient gets worse to take patient to the ER. Patient uses [facility].

## 2020-12-20 NOTE — Telephone Encounter (Signed)
Called spoke to daughter. She has updated cardiology yesterday and will continue to do so. No further questions.

## 2020-12-20 NOTE — Telephone Encounter (Signed)
Called daughter states that she has been using the Symbicort as directed. She has been using albuterol less after starting fluid pill but has been still using daily. Do you want her to come in for follow up.

## 2020-12-20 NOTE — Telephone Encounter (Signed)
FYI patient does have follow up on 2/1 daughter does not have time off work until then.

## 2020-12-20 NOTE — Telephone Encounter (Signed)
Noted. It sounds like her shortness of breath is coming from her heart failure, not from her COPD. I reviewed her cardiologist's note from 01/07 who mentions that she had significant lower extremity edema which is a sign of heart failure.  She needs to continue the fluid pill as directed and update the cardiologist as she (Dr. Harrell Gave) requested. I'm okay to see her on 02/01 as long as she updates her cardiologist.

## 2020-12-21 ENCOUNTER — Ambulatory Visit: Payer: Medicare Other | Admitting: Primary Care

## 2020-12-31 ENCOUNTER — Other Ambulatory Visit: Payer: Self-pay | Admitting: Primary Care

## 2020-12-31 ENCOUNTER — Other Ambulatory Visit: Payer: Self-pay | Admitting: General Practice

## 2020-12-31 DIAGNOSIS — J439 Emphysema, unspecified: Secondary | ICD-10-CM

## 2020-12-31 DIAGNOSIS — I10 Essential (primary) hypertension: Secondary | ICD-10-CM

## 2021-01-03 ENCOUNTER — Other Ambulatory Visit: Payer: Self-pay | Admitting: Oncology

## 2021-01-07 ENCOUNTER — Inpatient Hospital Stay: Payer: Medicare Other | Attending: Oncology

## 2021-01-07 ENCOUNTER — Other Ambulatory Visit: Payer: Self-pay

## 2021-01-07 DIAGNOSIS — E538 Deficiency of other specified B group vitamins: Secondary | ICD-10-CM | POA: Diagnosis not present

## 2021-01-07 DIAGNOSIS — D509 Iron deficiency anemia, unspecified: Secondary | ICD-10-CM | POA: Insufficient documentation

## 2021-01-07 LAB — CBC WITH DIFFERENTIAL/PLATELET
Abs Immature Granulocytes: 0.02 10*3/uL (ref 0.00–0.07)
Basophils Absolute: 0.1 10*3/uL (ref 0.0–0.1)
Basophils Relative: 1 %
Eosinophils Absolute: 0.1 10*3/uL (ref 0.0–0.5)
Eosinophils Relative: 1 %
HCT: 36.2 % (ref 36.0–46.0)
Hemoglobin: 11.4 g/dL — ABNORMAL LOW (ref 12.0–15.0)
Immature Granulocytes: 0 %
Lymphocytes Relative: 12 %
Lymphs Abs: 0.8 10*3/uL (ref 0.7–4.0)
MCH: 27.7 pg (ref 26.0–34.0)
MCHC: 31.5 g/dL (ref 30.0–36.0)
MCV: 88.1 fL (ref 80.0–100.0)
Monocytes Absolute: 0.6 10*3/uL (ref 0.1–1.0)
Monocytes Relative: 9 %
Neutro Abs: 5.6 10*3/uL (ref 1.7–7.7)
Neutrophils Relative %: 77 %
Platelets: 193 10*3/uL (ref 150–400)
RBC: 4.11 MIL/uL (ref 3.87–5.11)
RDW: 16.8 % — ABNORMAL HIGH (ref 11.5–15.5)
WBC: 7.2 10*3/uL (ref 4.0–10.5)
nRBC: 0 % (ref 0.0–0.2)

## 2021-01-07 LAB — IRON AND TIBC
Iron: 58 ug/dL (ref 28–170)
Saturation Ratios: 15 % (ref 10.4–31.8)
TIBC: 399 ug/dL (ref 250–450)
UIBC: 341 ug/dL

## 2021-01-07 LAB — VITAMIN B12: Vitamin B-12: 811 pg/mL (ref 180–914)

## 2021-01-07 LAB — FERRITIN: Ferritin: 79 ng/mL (ref 11–307)

## 2021-01-08 ENCOUNTER — Encounter: Payer: Self-pay | Admitting: Oncology

## 2021-01-08 ENCOUNTER — Inpatient Hospital Stay: Payer: Medicare Other | Attending: Oncology | Admitting: Oncology

## 2021-01-08 ENCOUNTER — Ambulatory Visit (INDEPENDENT_AMBULATORY_CARE_PROVIDER_SITE_OTHER)
Admission: RE | Admit: 2021-01-08 | Discharge: 2021-01-08 | Disposition: A | Discharge status: Home / Self Care | Payer: Medicare Other | Source: Ambulatory Visit | Attending: Primary Care | Admitting: Primary Care

## 2021-01-08 ENCOUNTER — Telehealth: Payer: Self-pay

## 2021-01-08 ENCOUNTER — Encounter: Payer: Self-pay | Admitting: Primary Care

## 2021-01-08 ENCOUNTER — Inpatient Hospital Stay: Payer: Medicare Other

## 2021-01-08 ENCOUNTER — Ambulatory Visit (INDEPENDENT_AMBULATORY_CARE_PROVIDER_SITE_OTHER): Payer: Medicare Other | Admitting: Primary Care

## 2021-01-08 ENCOUNTER — Other Ambulatory Visit: Payer: Self-pay | Admitting: Primary Care

## 2021-01-08 ENCOUNTER — Other Ambulatory Visit: Payer: Self-pay

## 2021-01-08 VITALS — BP 150/91 | HR 97 | Temp 98.4°F | Resp 20 | Wt 140.4 lb

## 2021-01-08 VITALS — BP 144/68 | HR 88 | Temp 97.8°F | Ht 63.0 in | Wt 144.9 lb

## 2021-01-08 DIAGNOSIS — I255 Ischemic cardiomyopathy: Secondary | ICD-10-CM | POA: Diagnosis not present

## 2021-01-08 DIAGNOSIS — D631 Anemia in chronic kidney disease: Secondary | ICD-10-CM | POA: Insufficient documentation

## 2021-01-08 DIAGNOSIS — R1319 Other dysphagia: Secondary | ICD-10-CM | POA: Diagnosis not present

## 2021-01-08 DIAGNOSIS — D509 Iron deficiency anemia, unspecified: Secondary | ICD-10-CM | POA: Diagnosis not present

## 2021-01-08 DIAGNOSIS — I10 Essential (primary) hypertension: Secondary | ICD-10-CM | POA: Diagnosis not present

## 2021-01-08 DIAGNOSIS — R1909 Other intra-abdominal and pelvic swelling, mass and lump: Secondary | ICD-10-CM | POA: Insufficient documentation

## 2021-01-08 DIAGNOSIS — I5042 Chronic combined systolic (congestive) and diastolic (congestive) heart failure: Secondary | ICD-10-CM

## 2021-01-08 DIAGNOSIS — G43701 Chronic migraine without aura, not intractable, with status migrainosus: Secondary | ICD-10-CM | POA: Diagnosis not present

## 2021-01-08 DIAGNOSIS — Z808 Family history of malignant neoplasm of other organs or systems: Secondary | ICD-10-CM | POA: Insufficient documentation

## 2021-01-08 DIAGNOSIS — R5383 Other fatigue: Secondary | ICD-10-CM | POA: Insufficient documentation

## 2021-01-08 DIAGNOSIS — Z79899 Other long term (current) drug therapy: Secondary | ICD-10-CM | POA: Diagnosis not present

## 2021-01-08 DIAGNOSIS — E538 Deficiency of other specified B group vitamins: Secondary | ICD-10-CM | POA: Insufficient documentation

## 2021-01-08 DIAGNOSIS — F5101 Primary insomnia: Secondary | ICD-10-CM | POA: Diagnosis not present

## 2021-01-08 DIAGNOSIS — Z885 Allergy status to narcotic agent status: Secondary | ICD-10-CM | POA: Insufficient documentation

## 2021-01-08 DIAGNOSIS — Z87891 Personal history of nicotine dependence: Secondary | ICD-10-CM | POA: Diagnosis not present

## 2021-01-08 DIAGNOSIS — J439 Emphysema, unspecified: Secondary | ICD-10-CM | POA: Diagnosis not present

## 2021-01-08 DIAGNOSIS — Z801 Family history of malignant neoplasm of trachea, bronchus and lung: Secondary | ICD-10-CM | POA: Diagnosis not present

## 2021-01-08 DIAGNOSIS — Z888 Allergy status to other drugs, medicaments and biological substances status: Secondary | ICD-10-CM | POA: Diagnosis not present

## 2021-01-08 DIAGNOSIS — I2510 Atherosclerotic heart disease of native coronary artery without angina pectoris: Secondary | ICD-10-CM | POA: Diagnosis not present

## 2021-01-08 DIAGNOSIS — R110 Nausea: Secondary | ICD-10-CM | POA: Diagnosis not present

## 2021-01-08 DIAGNOSIS — Z8049 Family history of malignant neoplasm of other genital organs: Secondary | ICD-10-CM | POA: Diagnosis not present

## 2021-01-08 DIAGNOSIS — K219 Gastro-esophageal reflux disease without esophagitis: Secondary | ICD-10-CM | POA: Diagnosis not present

## 2021-01-08 DIAGNOSIS — I272 Pulmonary hypertension, unspecified: Secondary | ICD-10-CM | POA: Insufficient documentation

## 2021-01-08 DIAGNOSIS — J811 Chronic pulmonary edema: Secondary | ICD-10-CM | POA: Diagnosis not present

## 2021-01-08 DIAGNOSIS — I252 Old myocardial infarction: Secondary | ICD-10-CM | POA: Insufficient documentation

## 2021-01-08 DIAGNOSIS — Z8249 Family history of ischemic heart disease and other diseases of the circulatory system: Secondary | ICD-10-CM | POA: Diagnosis not present

## 2021-01-08 DIAGNOSIS — Z818 Family history of other mental and behavioral disorders: Secondary | ICD-10-CM | POA: Diagnosis not present

## 2021-01-08 DIAGNOSIS — I509 Heart failure, unspecified: Secondary | ICD-10-CM | POA: Diagnosis not present

## 2021-01-08 DIAGNOSIS — N1831 Chronic kidney disease, stage 3a: Secondary | ICD-10-CM | POA: Insufficient documentation

## 2021-01-08 DIAGNOSIS — G35 Multiple sclerosis: Secondary | ICD-10-CM | POA: Insufficient documentation

## 2021-01-08 DIAGNOSIS — R58 Hemorrhage, not elsewhere classified: Secondary | ICD-10-CM | POA: Diagnosis not present

## 2021-01-08 DIAGNOSIS — I517 Cardiomegaly: Secondary | ICD-10-CM | POA: Diagnosis not present

## 2021-01-08 DIAGNOSIS — K648 Other hemorrhoids: Secondary | ICD-10-CM | POA: Insufficient documentation

## 2021-01-08 DIAGNOSIS — E611 Iron deficiency: Secondary | ICD-10-CM | POA: Diagnosis not present

## 2021-01-08 DIAGNOSIS — J449 Chronic obstructive pulmonary disease, unspecified: Secondary | ICD-10-CM | POA: Diagnosis not present

## 2021-01-08 DIAGNOSIS — E876 Hypokalemia: Secondary | ICD-10-CM

## 2021-01-08 DIAGNOSIS — J189 Pneumonia, unspecified organism: Secondary | ICD-10-CM

## 2021-01-08 LAB — BASIC METABOLIC PANEL
BUN: 16 mg/dL (ref 6–23)
CO2: 29 mEq/L (ref 19–32)
Calcium: 9.5 mg/dL (ref 8.4–10.5)
Chloride: 99 mEq/L (ref 96–112)
Creatinine, Ser: 0.93 mg/dL (ref 0.40–1.20)
GFR: 68.05 mL/min (ref >60.00)
Glucose, Bld: 87 mg/dL (ref 70–99)
Potassium: 2.7 mEq/L — CL (ref 3.5–5.1)
Sodium: 139 mEq/L (ref 135–145)

## 2021-01-08 LAB — LIPID PANEL
Cholesterol: 104 mg/dL (ref 0–200)
HDL: 40.2 mg/dL (ref >39.00)
LDL Cholesterol: 51 mg/dL (ref 0–99)
NonHDL: 64.2
Total CHOL/HDL Ratio: 3
Triglycerides: 66 mg/dL (ref 0.0–149.0)
VLDL: 13.2 mg/dL (ref 0.0–40.0)

## 2021-01-08 MED ORDER — AMOXICILLIN-POT CLAVULANATE 875-125 MG PO TABS: 1.0000 | ORAL_TABLET | Freq: Two times a day (BID) | ORAL | 0 refills | Status: DC

## 2021-01-08 MED ORDER — MIRTAZAPINE 15 MG PO TABS: 15.0000 mg | ORAL_TABLET | Freq: Every day | ORAL | 1 refills | Status: DC

## 2021-01-08 MED ORDER — POTASSIUM CHLORIDE CRYS ER 20 MEQ PO TBCR: 40.0000 meq | EXTENDED_RELEASE_TABLET | Freq: Two times a day (BID) | ORAL | 0 refills | Status: DC

## 2021-01-08 MED ORDER — FLUTICASONE-SALMETEROL 250-50 MCG/DOSE IN AEPB: 1.0000 | INHALATION_SPRAY | Freq: Two times a day (BID) | RESPIRATORY_TRACT | 3 refills | Status: DC

## 2021-01-08 NOTE — Assessment & Plan Note (Signed)
Asymptomatic. Continue rosuvastatin 20 mg, aspirin 81 mg. Continue blood pressure control.

## 2021-01-08 NOTE — Telephone Encounter (Addendum)
Please call patient:  Potassium level is too low and needs supplementation now.  I am sending a prescription for potassium chloride 20 mill equivalents.  Take 2 tablets by mouth twice daily for 5 days.  What she ever prescribed oral potassium daily? If not she may need this, but we will recheck her to find out.  Needs lab appointment in 1 week and also again in 3 weeks. Please schedule.  FYI to Dr. Harrell Gave.  She is not routinely taking torsemide, only as needed.  No lower extremity edema noted today.

## 2021-01-08 NOTE — Assessment & Plan Note (Signed)
Appears euvolemic and stable today.  Discussed as needed use of torsemide.  Continue carvedilol 3.125 mg twice daily, as needed torsemide 20 mg.  Recent cardiology visit reviewed

## 2021-01-08 NOTE — Assessment & Plan Note (Signed)
Occasional symptoms, resolved with Tums.  Discussed the relationship between untreated GERD and dysphagia.  We will be sending her to GI for evaluation of dysphagia.

## 2021-01-08 NOTE — Progress Notes (Signed)
Hematology/Oncology follow up note [facility] Telephone:(336) [phone] Fax:(336) [phone]   Patient Care Team: [name], NP as PCP - General (Internal Medicine) Buford Dresser, MD as PCP - Cardiology (Cardiology) Earlie Server, MD as Consulting Physician (Hematology and Oncology)  REFERRING PROVIDER: [name], NP  CHIEF COMPLAINTS/REASON FOR VISIT:  Follow up of anemia  HISTORY OF PRESENTING ILLNESS:  Anita [name] is a  60 y.o.  female with PMH listed below who was referred to me for evaluation of anemia Reviewed patient's recent labs that was done.  05/03/2020 labs revealed anemia with hemoglobin of 8.9, MCV 82.9 07/02/2020, iron saturation 11, ferritin 91, TIBC 307, iron 34. Reviewed patient's previous labs , anemia is chronic onset , duration is since April 2021.  She did not have much baseline blood work prior to April 2021.  She did have some remote blood work that was done in 2012 when she had a normal hemoglobin of 13.5. Patient has multiple medical problems.  She is a poor historian.  Daughter provides most of her 32. Patient was hospitalized in April 2021 due to acute respiratory failure with hypoxia, due to atypical pneumonia, non-STEMI, severe three-vessel disease and pulmonary hypertension, AKI. 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 Eston Esters in 2007.  Patient declined pursuing further diagnosis or therapeutic options. She also has a chronic history of MS.  Patient was recently seen by gastroenterology and had colonoscopy done. 5 mm transverse colon polyp which was resected and retrieved.  Diverticulosis.  Nonbleeding internal hemorrhoids. Patient denies any bloody or black bowel movement.  She continues to have chronic nausea with no vomiting. Appetite is not good.  Weight has been relatively stable.   She also reports bilateral upper extremity ecchymosis which is a chronic issue for her.    INTERVAL HISTORY Anita [name] is a 60 y.o. female who has above history reviewed by me today presents for follow up visit for management of Anemia Problems and complaints are listed below: No new complaints. She takes oral iron supplementation.   Review of Systems  Constitutional: Positive for fatigue. Negative for appetite change, chills, fever and unexpected weight change.  HENT:   Negative for hearing loss and voice change.   Eyes: Negative for eye problems.  Respiratory: Negative for chest tightness and cough.   Cardiovascular: Negative for chest pain.  Gastrointestinal: Negative for abdominal distention, abdominal pain, blood in stool and nausea.  Endocrine: Negative for hot flashes.  Genitourinary: Negative for difficulty urinating and frequency.   Musculoskeletal: Negative for arthralgias.  Skin: Negative for itching and rash.  Neurological: Negative for extremity weakness.  Hematological: Negative for adenopathy. Bruises/bleeds easily.  Psychiatric/Behavioral: Negative for confusion.     MEDICAL HISTORY:  Past Medical History:  Diagnosis Date  . Abnormality of gait 07/26/2013  . Acute respiratory distress 03/20/2020  . Acute respiratory failure with hypoxia ([place]) 03/20/2020  . Allergy   . Arthritis   . Atypical pneumonia 03/22/2020  . Chickenpox   . Elevated troponin 03/22/2020  . GERD (gastroesophageal reflux disease)   . Headache(784.0)    Migraine  . Hearing loss    Right ear secondary to infection  . History of shingles   . Migraines   . Multiple sclerosis ([place])   . Obese   . Optic neuritis   . Prolonged QT interval 03/20/2020    SURGICAL  HISTORY: Past Surgical History:  Procedure Laterality Date  . COLONOSCOPY WITH PROPOFOL N/A 08/03/2020   Procedure: COLONOSCOPY WITH PROPOFOL;  Surgeon: [name], MD;  Location: [facility] ENDOSCOPY;  Service: Gastroenterology;   Laterality: N/A;  . Ganglioneuroma     Resection  . LEFT HEART CATH AND CORONARY ANGIOGRAPHY N/A 03/23/2020   Procedure: LEFT HEART CATH AND CORONARY ANGIOGRAPHY;  Surgeon: Leonie Man, MD;  Location: [place] [facility];  Service: Cardiovascular;  Laterality: N/A;  . PILONIDAL CYST EXCISION    . PILONIDAL CYST EXCISION  1983  . TUMOR REMOVAL  2007/2008    SOCIAL HISTORY: Social History   Socioeconomic History  . Marital status: Divorced    Spouse name: Not on file  . Number of children: Not on file  . Years of education: Not on file  . Highest education level: Not on file  Occupational History  . Not on file  Tobacco Use  . Smoking status: Former Smoker    Packs/day: 1.00    Types: Cigarettes  . Smokeless tobacco: Never Used  Vaping Use  . Vaping Use: Never used  Substance and Sexual Activity  . Alcohol use: Yes    Comment: Occasional  . Drug use: Not on file  . Sexual activity: Not on file  Other Topics Concern  . Not on file  Social History Narrative  . Not on file   Social Determinants of Health   Financial Resource Strain: Not on file  Food Insecurity: Not on file  Transportation Needs: Not on file  Physical Activity: Not on file  Stress: Not on file  Social Connections: Not on file  Intimate Partner Violence: Not on file    FAMILY HISTORY: Family History  Problem Relation Age of Onset  . Skin cancer Mother   . Lung cancer Father   . Uterine cancer Sister   . Heart disease Brother   . Bipolar disorder Sister   . Throat cancer Paternal Grandmother     ALLERGIES:  is allergic to acthar hp [corticotropin], codeine, and prednisone.  MEDICATIONS:  Current Outpatient Medications  Medication Sig Dispense Refill  . acetaminophen (TYLENOL) 325 MG tablet Take 2 tablets (650 mg total) by mouth every 6 (six) hours as needed for mild pain (or Fever >/= 101).    Marland Kitchen albuterol (VENTOLIN HFA) 108 (90 Base) MCG/ACT inhaler Inhale 1-2 puffs into the lungs every 6  (six) hours as needed for wheezing or shortness of breath. Use sparingly! 18 each 0  . aspirin EC 81 MG EC tablet Take 1 tablet (81 mg total) by mouth daily.    . calcium carbonate (TUMS EX) 750 MG chewable tablet Chew 1 tablet by mouth daily as needed for heartburn.    . carvedilol (COREG) 3.125 MG tablet TAKE 1 TABLET (3.125 MG TOTAL) BY MOUTH 2 (TWO) TIMES DAILY WITH A MEAL. 180 tablet 2  . cetirizine (ZYRTEC) 10 MG tablet Take 10 mg by mouth daily.    . Doxylamine Succinate, Sleep, (SLEEP AID PO) Take 1 tablet by mouth at bedtime as needed (sleep).     . feeding supplement, ENSURE ENLIVE, (ENSURE ENLIVE) LIQD Take 237 mLs by mouth 2 (two) times daily between meals.    . ferrous sulfate 325 (65 FE) MG EC tablet TAKE 1 TABLET (325 MG TOTAL) BY MOUTH 2 (TWO) TIMES DAILY WITH A MEAL. 60 tablet 3  . Fluticasone-Salmeterol (ADVAIR DISKUS) 250-50 MCG/DOSE AEPB Inhale 1 puff into the lungs 2 (two) times daily. 1 each  3  . guaiFENesin (MUCINEX) 600 MG 12 hr tablet Take 600 mg by mouth 2 (two) times daily as needed for cough.    . losartan (COZAAR) 25 MG tablet Take 0.5 tablets (12.5 mg total) by mouth daily. For blood pressure. 45 tablet 0  . mirtazapine (REMERON) 15 MG tablet Take 1 tablet (15 mg total) by mouth at bedtime. 90 tablet 1  . Multiple Vitamin (MULTIVITAMIN WITH MINERALS) TABS tablet Take 1 tablet by mouth daily.    . nitroGLYCERIN (NITROSTAT) 0.4 MG SL tablet as needed.    . Probiotic Product (PROBIOTIC ADVANCED PO) Take by mouth.    . rosuvastatin (CRESTOR) 20 MG tablet Take 1 tablet (20 mg total) by mouth daily. For cholesterol. 90 tablet 3  . SUMAtriptan (IMITREX) 25 MG tablet Take 1 tablet by mouth at migraine onset, may repeat in 2 hours if headache persists or recurs. Do not exceed 2 tablets in 24 hours. 10 tablet 0  . torsemide (DEMADEX) 20 MG tablet Take 1 tablet (20 mg total) by mouth daily as needed. For leg or abdomen swelling 30 tablet 11  . vitamin B-12 (CYANOCOBALAMIN) 1000  MCG tablet Take 1,000 mcg by mouth 2 (two) times a week.     No current facility-administered medications for this visit.     PHYSICAL EXAMINATION: ECOG PERFORMANCE STATUS: 2 - Symptomatic, <50% confined to bed Vitals:   01/08/21 1408  BP: (!) 150/91  Pulse: 97  Resp: 20  Temp: 98.4 F (36.9 C)   Filed Weights   01/08/21 1408  Weight: 140 lb 6.4 oz (63.7 kg)    Physical Exam Constitutional:      General: She is not in acute distress. HENT:     Head: Normocephalic and atraumatic.  Eyes:     General: No scleral icterus. Cardiovascular:     Rate and Rhythm: Normal rate and regular rhythm.     Heart sounds: Murmur heard.    Pulmonary:     Effort: Pulmonary effort is normal. No respiratory distress.     Breath sounds: No wheezing.  Abdominal:     General: Bowel sounds are normal. There is no distension.     Palpations: Abdomen is soft.  Musculoskeletal:        General: No deformity. Normal range of motion.     Cervical back: Normal range of motion and neck supple.  Skin:    General: Skin is warm and dry.     Coloration: Skin is pale.     Comments: Bilateral upper extremity ecchymosis.  Neurological:     Mental Status: She is alert and oriented to person, place, and time. Mental status is at baseline.  Psychiatric:        Mood and Affect: Mood normal.      LABORATORY DATA:  I have reviewed the data as listed Lab Results  Component Value Date   WBC 7.2 01/07/2021   HGB 11.4 (L) 01/07/2021   HCT 36.2 01/07/2021   MCV 88.1 01/07/2021   PLT 193 01/07/2021   Recent Labs    03/20/20 0826 03/20/20 1701 03/27/20 0849 03/28/20 0254 05/03/20 1055 06/22/20 1550 08/14/20 1150 09/12/20 2309  NA  --    < > 135 135   < > 139 136 140  K  --    < > 4.7 5.1   < > 4.0 3.2* 2.5*  CL  --    < > 105 105   < > 108 101 100  CO2  --    < >  17* 21*   < > 18* 24 29  GLUCOSE  --    < > 142* 116*   < > 84 102* 92  BUN  --    < > 68* 68*   < > 17 14 21*  CREATININE  --     < > 1.58* 1.51*   < > 1.26* 1.04* 1.23*  CALCIUM  --    < > 9.2 9.4   < > 9.1 9.0 9.5  GFRNONAA  --    < > 36* 38*  --   --  60* 49*  GFRAA  --    < > 42* 44*  --   --  >60  --   PROT 6.2*   < > 6.0* 6.5  --  6.4 6.6 7.1  ALBUMIN 3.1*   < > 2.9* 3.0*  --   --  3.6 4.0  AST 18   < > 138* 108*  --  12 22 22   ALT 11   < > 225* 208*  --  6 16 14   ALKPHOS 56   < > 165* 235*  --   --  55 47  BILITOT 0.7   < > 0.6 0.8  --  0.5 0.6 1.0  BILIDIR 0.1  --   --   --   --   --   --   --   IBILI 0.6  --   --   --   --   --   --   --    < > = values in this interval not displayed.   Iron/TIBC/Ferritin/ %Sat    Component Value Date/Time   IRON 58 01/07/2021 1338   IRON 34 07/02/2020 1620   TIBC 399 01/07/2021 1338   TIBC 307 07/02/2020 1620   FERRITIN 79 01/07/2021 1338   FERRITIN 91 07/02/2020 1620   IRONPCTSAT 15 01/07/2021 1338   IRONPCTSAT 11 (L) 07/02/2020 1620        ASSESSMENT & PLAN:  1. Iron deficiency anemia, unspecified iron deficiency anemia type   2. Vitamin B12 deficiency   3. Anemia due to stage 3a chronic kidney disease (HCC)    #Anemia, combination of iron deficiency as well as chronic kidney disease. Labs are reviewed and discussed with patient. Hemoglobin is 11.4, iron panel has improved.  Continue oral iron supplementation twice daily with meals.   Vitamin B12 deficiency, continue vitamin B12 twice weekly.   All questions were answered. The patient knows to call the clinic with any problems questions or concerns. Roberts Gaudy, NP  Return of visit: 6 months.   Earlie Server, MD, PhD 01/08/2021

## 2021-01-08 NOTE — Assessment & Plan Note (Signed)
Follows with hematology, recent iron levels reviewed.

## 2021-01-08 NOTE — Telephone Encounter (Signed)
[facility] called critical results @ 1450  Potassium 2.7

## 2021-01-08 NOTE — Telephone Encounter (Signed)
No, I already placed orders.

## 2021-01-08 NOTE — Assessment & Plan Note (Signed)
Denies concerns for migraines, continue to monitor.

## 2021-01-08 NOTE — Assessment & Plan Note (Signed)
No recent neurology visit, has declined to see neurology in the past, did not attend neurology visit when referred 2 years ago.

## 2021-01-08 NOTE — Assessment & Plan Note (Signed)
Seems improved on mirtazapine 7.5 mg overall, perhaps not quite at goal.  I discussed the importance of avoiding daytime napping so that she can sleep better at night.  Increase mirtazapine to 15 mg.  She will update.

## 2021-01-08 NOTE — Assessment & Plan Note (Signed)
Inappropriate use of SABA, discussed this with patient today.  It is most likely that COPD is contributing to her shortness of breath, however she is very sedentary and hardly moves from her chair.  We discussed the need for daily ambulation and breathing exercises, especially in light of her multiple sclerosis diagnosis.  Lungs are clear on exam, but due to increased dyspnea with euvolemic appearance today we will obtain a chest x-ray.  Stop Symbicort, start Advair 250, 1 puff twice daily. Again stressed the importance of using albuterol only if needed.  I offered a pulmonology referral, she currently declines and would like to try Advair first.  She will update.

## 2021-01-08 NOTE — Progress Notes (Signed)
Subjective:    Patient ID: Anita [name], female    DOB: 07-18-1961, 60 y.o.   MRN: [id number]  HPI  This visit occurred during the SARS-CoV-2 public health emergency.  Safety protocols were in place, including screening questions prior to the visit, additional usage of staff PPE, and extensive cleaning of exam room while observing appropriate contact time as indicated for disinfecting solutions.   Ms. Kovack is a 60 year old female with a significant medical history including COPD, GERD, multiple sclerosis, hypertension, CAD with NSTEMI, CHF, osteoarthritis, AKI, anemia who presents today for follow-up.  1) CHF, CAD with NSTEMI, cardiomyopathy: She is currently following with cardiology, Dr. Harrell Gave, with last visit being in early January 2022. During this appointment she endorsed feeling "poorly" with intermittent SOB, using albuterol 2-3 times daily, compliant to Symbicort. Also endorsed lack of compliance to diuretic pill, endorsed increased lower extremity edema. She does have moderate to severe mitral regurgitation. Her carvedilol and losartan were continued, she was encouraged to use her torsemide daily for 6 days.   Today she endorses that she hasn't noticed any lower extremity edema.  She is not using torsemide on a daily basis, only as needed.  She denies chest pain.  2) COPD: She's smoked for years, quit smoking about one year ago during her hospitalization.She is compliant to her Symbicort 2 puffs BID. She is using her albuterol inhaler 2 times daily, most every day. She uses her albuterol due to symptoms of resting shortness of breath and inability to take in a deep breath. She has not seen pulmonology.  She leads a very sedentary life.  She is following with hematology for chronic anemia with iron deficiency anemia. She is compliant to her oral iron.   3) Dysphagia: Chronic for several months, has noticed "gagging" shortly after swallowing that occurs with nearly  each meal. She has choked a couple of times.  Her daughter does not notice her gagging when she eats soft foods or drinks Ensure.  She does experience infrequent esophageal reflux, takes Tums on occasion.  She has mentioned her symptoms to numerous physicians over the year, no one has done anything about this yet.  4) Sleep Disturbance: She is compliant to mirtazapine 7.5 mg for sleep, her daughter endorses that she seems to be sleeping some, but thinks she may benefit from a dose increase, she agrees. She does nap during the day, but feels awake at night.   BP Readings from Last 3 Encounters:  01/08/21 (!) 144/68  12/14/20 (!) 160/88  10/08/20 130/89     Review of Systems  Constitutional: Negative for fever.  Respiratory: Positive for cough and shortness of breath. Negative for wheezing.   Cardiovascular: Negative for chest pain and leg swelling.  Gastrointestinal:       Dysphagia with choking.  Psychiatric/Behavioral: Positive for sleep disturbance.       Past Medical History:  Diagnosis Date  . Abnormality of gait 07/26/2013  . Acute respiratory distress 03/20/2020  . Acute respiratory failure with hypoxia ([place]) 03/20/2020  . Allergy   . Arthritis   . Atypical pneumonia 03/22/2020  . Chickenpox   . Elevated troponin 03/22/2020  . GERD (gastroesophageal reflux disease)   . Headache(784.0)    Migraine  . Hearing loss    Right ear secondary to infection  . History of shingles   . Migraines   . Multiple sclerosis ([place])   . Obese   . Optic neuritis   . Prolonged QT interval  03/20/2020     Social History   Socioeconomic History  . Marital status: Divorced    Spouse name: Not on file  . Number of children: Not on file  . Years of education: Not on file  . Highest education level: Not on file  Occupational History  . Not on file  Tobacco Use  . Smoking status: Former Smoker    Packs/day: 1.00    Types: Cigarettes  . Smokeless tobacco: Never Used  Vaping Use  . Vaping  Use: Never used  Substance and Sexual Activity  . Alcohol use: Yes    Comment: Occasional  . Drug use: Not on file  . Sexual activity: Not on file  Other Topics Concern  . Not on file  Social History Narrative  . Not on file   Social Determinants of Health   Financial Resource Strain: Not on file  Food Insecurity: Not on file  Transportation Needs: Not on file  Physical Activity: Not on file  Stress: Not on file  Social Connections: Not on file  Intimate Partner Violence: Not on file    Past Surgical History:  Procedure Laterality Date  . COLONOSCOPY WITH PROPOFOL N/A 08/03/2020   Procedure: COLONOSCOPY WITH PROPOFOL;  Surgeon: [name], MD;  Location: [facility] ENDOSCOPY;  Service: Gastroenterology;  Laterality: N/A;  . Ganglioneuroma     Resection  . LEFT HEART CATH AND CORONARY ANGIOGRAPHY N/A 03/23/2020   Procedure: LEFT HEART CATH AND CORONARY ANGIOGRAPHY;  Surgeon: Leonie Man, MD;  Location: [place] [facility];  Service: Cardiovascular;  Laterality: N/A;  . PILONIDAL CYST EXCISION    . PILONIDAL CYST EXCISION  1983  . TUMOR REMOVAL  2007/2008    Family History  Problem Relation Age of Onset  . Skin cancer Mother   . Lung cancer Father   . Uterine cancer Sister   . Heart disease Brother   . Bipolar disorder Sister   . Throat cancer Paternal Grandmother     Allergies  Allergen Reactions  . Acthar Hp [Corticotropin] Other (See Comments)    Throat swelling and coughing up blood  . Codeine   . Prednisone     Current Outpatient Medications on File Prior to Visit  Medication Sig Dispense Refill  . acetaminophen (TYLENOL) 325 MG tablet Take 2 tablets (650 mg total) by mouth every 6 (six) hours as needed for mild pain (or Fever >/= 101).    Marland Kitchen albuterol (VENTOLIN HFA) 108 (90 Base) MCG/ACT inhaler Inhale 1-2 puffs into the lungs every 6 (six) hours as needed for wheezing or shortness of breath. Use sparingly! 18 each 0  . aspirin EC 81 MG EC tablet Take 1 tablet  (81 mg total) by mouth daily.    . calcium carbonate (TUMS EX) 750 MG chewable tablet Chew 1 tablet by mouth daily as needed for heartburn.    . carvedilol (COREG) 3.125 MG tablet TAKE 1 TABLET (3.125 MG TOTAL) BY MOUTH 2 (TWO) TIMES DAILY WITH A MEAL. 180 tablet 2  . cetirizine (ZYRTEC) 10 MG tablet Take 10 mg by mouth daily.    . Doxylamine Succinate, Sleep, (SLEEP AID PO) Take 1 tablet by mouth at bedtime as needed (sleep).     . feeding supplement, ENSURE ENLIVE, (ENSURE ENLIVE) LIQD Take 237 mLs by mouth 2 (two) times daily between meals.    . ferrous sulfate 325 (65 FE) MG EC tablet TAKE 1 TABLET (325 MG TOTAL) BY MOUTH 2 (TWO) TIMES DAILY WITH A MEAL.  60 tablet 3  . guaiFENesin (MUCINEX) 600 MG 12 hr tablet Take 600 mg by mouth 2 (two) times daily as needed for cough.    . losartan (COZAAR) 25 MG tablet Take 0.5 tablets (12.5 mg total) by mouth daily. For blood pressure. 45 tablet 0  . Multiple Vitamin (MULTIVITAMIN WITH MINERALS) TABS tablet Take 1 tablet by mouth daily.    . nitroGLYCERIN (NITROSTAT) 0.4 MG SL tablet as needed.    . Probiotic Product (PROBIOTIC ADVANCED PO) Take by mouth.    . rosuvastatin (CRESTOR) 20 MG tablet Take 1 tablet (20 mg total) by mouth daily. For cholesterol. 90 tablet 3  . SUMAtriptan (IMITREX) 25 MG tablet Take 1 tablet by mouth at migraine onset, may repeat in 2 hours if headache persists or recurs. Do not exceed 2 tablets in 24 hours. 10 tablet 0  . torsemide (DEMADEX) 20 MG tablet Take 1 tablet (20 mg total) by mouth daily as needed. For leg or abdomen swelling 30 tablet 11  . vitamin B-12 (CYANOCOBALAMIN) 1000 MCG tablet Take 1,000 mcg by mouth 2 (two) times a week.     No current facility-administered medications on file prior to visit.    BP (!) 144/68   Pulse 88   Temp 97.8 F (36.6 C) (Temporal)   Ht 5\' 3"  (1.6 m)   Wt 144 lb 14.4 oz (65.7 kg)   BMI 25.67 kg/m    Objective:   Physical Exam Constitutional:      Appearance: She is  well-nourished.  Cardiovascular:     Rate and Rhythm: Normal rate and regular rhythm.     Comments: Appears euvolemic, no lower extremity edema noted. Pulmonary:     Effort: Pulmonary effort is normal.     Breath sounds: Normal breath sounds. No wheezing or rhonchi.  Musculoskeletal:     Cervical back: Neck supple.  Skin:    General: Skin is warm and dry.  Neurological:     Mental Status: She is alert and oriented to person, place, and time.  Psychiatric:        Mood and Affect: Mood and affect and mood normal.            Assessment & Plan:

## 2021-01-08 NOTE — Assessment & Plan Note (Signed)
Chronic, gagging nearly every meal.  Discussed the importance of chewing food very thoroughly, avoiding tougher foods.  Referral placed to GI for evaluation.

## 2021-01-08 NOTE — Addendum Note (Signed)
Addended by: [name] on: 01/08/2021 04:30 PM   Modules accepted: Orders

## 2021-01-08 NOTE — Patient Instructions (Addendum)
You will be contacted regarding your referral to GI for your choking.  Please let us know if you have not been contacted within two weeks.   Stop using budesonide- formoterol (Symbicort) inhaler.  Start using fluticasone-Salmeterol (Advair) inhaler for breathing. Inhale 1 puff into the lungs twice daily.  Reserve use of your albuterol inhaler, only use this if absolutely needed.  Stop by the lab and xray prior to leaving today. I will notify you of your results once received.   We increased the dose of your mirtazapine to 15 mg. You may take 2 of the 7.5 mg capsules until your bottle is empty.  It was a pleasure to see you today!

## 2021-01-08 NOTE — Telephone Encounter (Signed)
Called patient spoke to daughter (on [place]) gave results and had repeat back to me. Will start meds today. I have made both lab appointments. Do you need me to place future orders?

## 2021-01-08 NOTE — Progress Notes (Signed)
Patient energy level varies from day to day.

## 2021-01-09 ENCOUNTER — Telehealth: Payer: Self-pay

## 2021-01-09 NOTE — Telephone Encounter (Signed)
Anita [name] (Key: [id number]) [phone] Wixela Inhub 250-50MCG/DOSE aerosol powder     Status: Sent to Plan  Created: February 1st, 2022 [phone]  Sent: February 2nd, 2022

## 2021-01-15 ENCOUNTER — Other Ambulatory Visit (INDEPENDENT_AMBULATORY_CARE_PROVIDER_SITE_OTHER): Payer: Medicare Other

## 2021-01-15 ENCOUNTER — Other Ambulatory Visit: Payer: Self-pay

## 2021-01-15 DIAGNOSIS — E876 Hypokalemia: Secondary | ICD-10-CM

## 2021-01-15 LAB — BASIC METABOLIC PANEL
BUN: 15 mg/dL (ref 6–23)
CO2: 27 mEq/L (ref 19–32)
Calcium: 9.4 mg/dL (ref 8.4–10.5)
Chloride: 103 mEq/L (ref 96–112)
Creatinine, Ser: 1.05 mg/dL (ref 0.40–1.20)
GFR: 58.82 mL/min — ABNORMAL LOW (ref >60.00)
Glucose, Bld: 114 mg/dL — ABNORMAL HIGH (ref 70–99)
Potassium: 3.4 mEq/L — ABNORMAL LOW (ref 3.5–5.1)
Sodium: 138 mEq/L (ref 135–145)

## 2021-01-16 ENCOUNTER — Encounter: Payer: Self-pay | Admitting: Cardiology

## 2021-01-16 ENCOUNTER — Ambulatory Visit (INDEPENDENT_AMBULATORY_CARE_PROVIDER_SITE_OTHER): Payer: Medicare Other | Admitting: Cardiology

## 2021-01-16 VITALS — BP 146/98 | HR 105 | Ht 63.0 in | Wt 145.8 lb

## 2021-01-16 DIAGNOSIS — I2510 Atherosclerotic heart disease of native coronary artery without angina pectoris: Secondary | ICD-10-CM | POA: Diagnosis not present

## 2021-01-16 DIAGNOSIS — I255 Ischemic cardiomyopathy: Secondary | ICD-10-CM

## 2021-01-16 DIAGNOSIS — R0602 Shortness of breath: Secondary | ICD-10-CM | POA: Diagnosis not present

## 2021-01-16 DIAGNOSIS — E7800 Pure hypercholesterolemia, unspecified: Secondary | ICD-10-CM | POA: Diagnosis not present

## 2021-01-16 DIAGNOSIS — I5042 Chronic combined systolic (congestive) and diastolic (congestive) heart failure: Secondary | ICD-10-CM | POA: Diagnosis not present

## 2021-01-16 DIAGNOSIS — I340 Nonrheumatic mitral (valve) insufficiency: Secondary | ICD-10-CM | POA: Diagnosis not present

## 2021-01-16 NOTE — Progress Notes (Signed)
Cardiology Office Note:    Date:  01/16/2021   ID:  Anita [name], DOB 1960/12/09, MRN [id number]  PCP:  [name], NP  Cardiologist:  Buford Dresser, MD  Referring MD: [name], NP   CC: follow up  History of Present Illness:    Anita [name] is a 60 y.o. female with history of multiple sclerosis, CAD, likely COPD, prior tobacco use. I met her in the hospital 03/2020, see workup below.  Today: Has been switching inhalers, has been an adjustment. Breathing is stable though not good. Avoiding rescue inhaler. Had CXR 2/1, concern for RML pheumonia, started antibiotics. Coughing has decreased. Plan is that if breathing doesn't improve to refer to pulmonology. Would recommend PFTs as well.  Still with pitting edema but much improved. Sleeping better with remeron, trying to avoid napping during the day to sleep better at night. Weight at last visit was 165 lbs; 143 lbs today. Hard to weigh at home.   Craving smoking but has been able to avoid as she doesn't have access to cigarettes.  Sleeps on 2 thin pillows, stable. Rare PND.   Appetite has been poor, having trouble with gagging intermittently. Has GI appt upcoming. Able to eat small amounts/snacks with better results than eating actual meals.   She had several episodes of feeling short of breath during our visit. Multiple checks with pulse ox, O2 sats always above 88% and most low 90s. No longer has home O2.   Past Medical History:  Diagnosis Date  . Abnormality of gait 07/26/2013  . Acute respiratory distress 03/20/2020  . Acute respiratory failure with hypoxia ([place]) 03/20/2020  . Allergy   . Arthritis   . Atypical pneumonia 03/22/2020  . Chickenpox   . Elevated troponin 03/22/2020  . GERD (gastroesophageal reflux disease)   . Headache(784.0)    Migraine  . Hearing loss    Right ear secondary to infection  . History of shingles   . Migraines   . Multiple sclerosis ([place])   . Obese   .  Optic neuritis   . Prolonged QT interval 03/20/2020    Past Surgical History:  Procedure Laterality Date  . COLONOSCOPY WITH PROPOFOL N/A 08/03/2020   Procedure: COLONOSCOPY WITH PROPOFOL;  Surgeon: [name], MD;  Location: [facility] ENDOSCOPY;  Service: Gastroenterology;  Laterality: N/A;  . Ganglioneuroma     Resection  . LEFT HEART CATH AND CORONARY ANGIOGRAPHY N/A 03/23/2020   Procedure: LEFT HEART CATH AND CORONARY ANGIOGRAPHY;  Surgeon: Leonie Man, MD;  Location: [facility];  Service: Cardiovascular;  Laterality: N/A;  . PILONIDAL CYST EXCISION    . PILONIDAL CYST EXCISION  1983  . TUMOR REMOVAL  2007/2008    Current Medications: Current Outpatient Medications on File Prior to Visit  Medication Sig  . acetaminophen (TYLENOL) 325 MG tablet Take 2 tablets (650 mg total) by mouth every 6 (six) hours as needed for mild pain (or Fever >/= 101).  Marland Kitchen amoxicillin-clavulanate (AUGMENTIN) 875-125 MG tablet Take 1 tablet by mouth 2 (two) times daily.  Marland Kitchen aspirin EC 81 MG EC tablet Take 1 tablet (81 mg total) by mouth daily.  . calcium carbonate (TUMS EX) 750 MG chewable tablet Chew 1 tablet by mouth daily as needed for heartburn.  . carvedilol (COREG) 3.125 MG tablet TAKE 1 TABLET (3.125 MG TOTAL) BY MOUTH 2 (TWO) TIMES DAILY WITH A MEAL.  Marland Kitchen Doxylamine Succinate, Sleep, (SLEEP AID PO) Take 1 tablet by mouth at bedtime  as needed (sleep).   . feeding supplement, ENSURE ENLIVE, (ENSURE ENLIVE) LIQD Take 237 mLs by mouth 2 (two) times daily between meals.  . ferrous sulfate 325 (65 FE) MG EC tablet TAKE 1 TABLET (325 MG TOTAL) BY MOUTH 2 (TWO) TIMES DAILY WITH A MEAL.  Marland Kitchen Fluticasone-Salmeterol (ADVAIR DISKUS) 250-50 MCG/DOSE AEPB Inhale 1 puff into the lungs 2 (two) times daily.  Marland Kitchen guaiFENesin (MUCINEX) 600 MG 12 hr tablet Take 600 mg by mouth 2 (two) times daily as needed for cough.  . losartan (COZAAR) 25 MG tablet Take 0.5 tablets (12.5 mg total) by mouth daily. For blood pressure.  .  mirtazapine (REMERON) 15 MG tablet Take 1 tablet (15 mg total) by mouth at bedtime.  . Multiple Vitamin (MULTIVITAMIN WITH MINERALS) TABS tablet Take 1 tablet by mouth daily.  . nitroGLYCERIN (NITROSTAT) 0.4 MG SL tablet as needed.  . potassium chloride SA (KLOR-CON) 20 MEQ tablet Take 2 tablets (40 mEq total) by mouth 2 (two) times daily. For low potassium.  . Probiotic Product (PROBIOTIC ADVANCED PO) Take by mouth.  . rosuvastatin (CRESTOR) 20 MG tablet Take 1 tablet (20 mg total) by mouth daily. For cholesterol.  . SUMAtriptan (IMITREX) 25 MG tablet Take 1 tablet by mouth at migraine onset, may repeat in 2 hours if headache persists or recurs. Do not exceed 2 tablets in 24 hours.  . torsemide (DEMADEX) 20 MG tablet Take 1 tablet (20 mg total) by mouth daily as needed. For leg or abdomen swelling  . vitamin B-12 (CYANOCOBALAMIN) 1000 MCG tablet Take 1,000 mcg by mouth 2 (two) times a week.  Marland Kitchen albuterol (VENTOLIN HFA) 108 (90 Base) MCG/ACT inhaler Inhale 1-2 puffs into the lungs every 6 (six) hours as needed for wheezing or shortness of breath. Use sparingly! (Patient not taking: Reported on 01/16/2021)  . cetirizine (ZYRTEC) 10 MG tablet Take 10 mg by mouth daily. (Patient not taking: Reported on 01/16/2021)   No current facility-administered medications on file prior to visit.     Allergies:   Acthar hp [corticotropin], Codeine, and Prednisone   Social History   Tobacco Use  . Smoking status: Former Smoker    Packs/day: 1.00    Types: Cigarettes  . Smokeless tobacco: Never Used  Vaping Use  . Vaping Use: Never used  Substance Use Topics  . Alcohol use: Yes    Comment: Occasional    Family History: family history includes Bipolar disorder in her sister; Heart disease in her brother; Lung cancer in her father; Skin cancer in her mother; Throat cancer in her paternal grandmother; Uterine cancer in her sister.  ROS:   Please see the history of present illness.  Additional pertinent ROS  otherwise unremarkable.    EKGs/Labs/Other Studies Reviewed:    The following studies were reviewed today: Echo 03/20/20 1. Left ventricular ejection fraction, by estimation, is 40 to 45%. The  left ventricle has mildly decreased function. The left ventricle  demonstrates global hypokinesis. The left ventricular internal cavity size  was mildly dilated. Left ventricular  diastolic function could not be evaluated.  2. Right ventricular systolic function is normal. The right ventricular  size is normal. There is moderately elevated pulmonary artery systolic  pressure.  3. Left atrial size was mildly dilated.  4. Moderate mitral subvalvular thickening/fibrosis.  5. The mitral valve is rheumatic. Moderate to severe mitral valve  regurgitation. Mild mitral stenosis. The mean mitral valve gradient is 7.3  mmHg with average heart rate of 105 bpm.  6. The aortic valve is normal in structure. Aortic valve regurgitation is  not visualized.  7. The inferior vena cava is dilated in size with <50% respiratory  variability, suggesting right atrial pressure of 15 mmHg.  Cath 03/23/20  Hemodynamics: LV end diastolic pressure is moderately elevated.  ----Coronary Angiography-----  Ost RCA to Dist RCA lesion is 100% stenosed.-The PDA and PL system fills via faint collaterals from the AV groove LCx and LAD septals.  Mid LAD-1 lesion is 60% stenosed. Mid LAD-2 lesion is 50% stenosed. Dist LAD lesion is 55% stenosed with 70% stenosed side branch in 2nd Diag.  1st Diag lesion is 70% stenosed.  Ost Cx to Prox Cx lesion is 45% stenosed. Prox-MID Cx lesion is 65% stenosed.   MODERATE-SEVERE THREE-VESSEL CAD: ? 100% proximal RCA occlusion with left-to-right collaterals faintly filling PDA and PL system (AV groove LCx-PL and LAD septal-PDA) ? Diffuse moderate mid LAD disease with 60% to 50% stenosis. ? Tandem 50% and 65% proximal and mid LCx with the 65% lesion being the most significant  lesion.  Moderately elevated LVEDP of 18-20 mmHg  Given the extent of disease in the LAD and LCx, neither lesion is very amenable to PCI, and RCA is chronically occluded. Given her comorbidities, I do not think she would be a good CABG candidate especially in light of the fact that she would likely require valve surgery as well. She would not recover well.  Recommendations aggressive medical management.  EKG:  EKG is personally reviewed.  The ekg ordered today demonstrates sinus tachycardia with pulmonary disease pattern  Recent Labs: 03/20/2020: B Natriuretic Peptide 1,255.6 08/14/2020: TSH 1.437 09/12/2020: ALT 14 09/13/2020: Magnesium 2.1 01/07/2021: Hemoglobin 11.4; Platelets 193 01/15/2021: BUN 15; Creatinine, Ser 1.05; Potassium 3.4; Sodium 138  Recent Lipid Panel    Component Value Date/Time   CHOL 104 01/08/2021 1016   TRIG 66.0 01/08/2021 1016   HDL 40.20 01/08/2021 1016   CHOLHDL 3 01/08/2021 1016   VLDL 13.2 01/08/2021 1016   LDLCALC 51 01/08/2021 1016    Physical Exam:    VS:  BP (!) 146/98 (BP Location: Right Arm, Patient Position: Sitting)   Pulse (!) 105   Ht 5' 3" (1.6 m)   Wt 145 lb 12.8 oz (66.1 kg)   SpO2 94%   BMI 25.83 kg/m     Wt Readings from Last 3 Encounters:  01/16/21 145 lb 12.8 oz (66.1 kg)  01/08/21 140 lb 6.4 oz (63.7 kg)  01/08/21 144 lb 14.4 oz (65.7 kg)    GEN: frail appearing, visibly dyspneic despite normal BP, mild tachycardia, and baseline borderline O2 stats HEENT: Normal, moist mucous membranes NECK: No JVD appreciate sitting upright CARDIAC: regular rhythm, normal S1 and S2, no rubs or gallops. 3/6 HS murmur. VASCULAR: Radial and DP pulses 2+ bilaterally. No carotid bruits RESPIRATORY:  Distant, largely clear on right side, velcro crackles on left side. No wheezing appreciated ABDOMEN: Soft, non-tender, non-distended MUSCULOSKELETAL:  In wheelchair but moves all limbs independently. SKIN: Warm and dry. Circumference of legs much  improved, visible ankle bones, but still with 1-2+ pitting and dependent edema. Improved from prior. NEUROLOGIC:  Alert and oriented x 3. No focal neuro deficits noted. PSYCHIATRIC:  Normal affect   ASSESSMENT:    1. SOB (shortness of breath)   2. Ischemic cardiomyopathy   3. Chronic combined systolic and diastolic heart failure ([place])   4. Coronary artery disease involving native coronary artery of native heart without angina pectoris  5. Mitral valve insufficiency, unspecified etiology   6. Pure hypercholesterolemia    PLAN:    Shortness of breath: -difficult, and likely multifactorial -known cardiomyopathy, known mitral regurgitation, and being treated for COPD -her edema did improve with diuretics; better though still present -she is not a candidate for open heart surgery given her overall frailty -question would be if she is a candidate for mitraclip, but with her rheumatic valve and already elevated gradient, my concern would be whether a clip would worsen mitral stenosis despite improving mitral regurgitation -she also has some velcro crackles, though the RML (location of possible pneumonia) sounds good today -I will repeat her echo to see if EF/MR worsened. Will also be helpful to determine her degree of pulmonary hypertension -we have limited options from a cardiac perspective -she has now quit smoking, but has likely underlying COPD and is on inhalers -she is being monitored closely by her PCP Alma Friendly.  -if echo similar to prior, my recommendation would be to refer to pulmonology with PFTs. I doubt that all of her symptoms are pulmonary given her known cardiac disease, but given her tenuous status, if we can optimize her pulmonary regimen we can hopefully get her feeling somewhat less air hungry. -she had home O2 previously but this has been returned to the company. She is not significantly exertional. O2 over 88% today. -her clinical status is tenuous. She appears much  older than her stated age, and she does not appear to have any reserve. High risk for decompensation  Cardiomyopathy, ischemic Chronic systolic and diastolic heart failure CAD with CTO RCA -tolerating aspirin 81 mg daily -continue rosuvastatin 20 mg daily for hypercholesterolemia -tolerating carvedilol 3.125 mg BID and losartan 12.5 mg daily  Moderate to severe mitral regurgitation with rheumatic appearing mitral valve, without severe mitral stenosis -thickened leaflets with rheumatic movement -MVA >3 based on pressure half time  Plan for follow up: 6 weeks  Total time of encounter: 53 minutes total time of encounter, including 23 minutes spent in face-to-face patient care. This time includes coordination of care and counseling regarding shortness of breath. Remainder of non-face-to-face time involved reviewing chart documents/testing relevant to the patient encounter and documentation in the medical record.  Buford Dresser, MD, PhD, [facility]   Medication Adjustments/Labs and Tests Ordered: Current medicines are reviewed at length with the patient today.  Concerns regarding medicines are outlined above.  Orders Placed This Encounter  Procedures  . EKG 12-Lead  . ECHOCARDIOGRAM COMPLETE   No orders of the defined types were placed in this encounter.   Patient Instructions  Medication Instructions:  Your Physician recommend you continue on your current medication as directed.    *If you need a refill on your cardiac medications before your next appointment, please call your pharmacy*   Lab Work: None  Testing/Procedures: Your physician has requested that you have an echocardiogram. Echocardiography is a painless test that uses sound waves to create images of your heart. It provides your doctor with information about the size and shape of your heart and how well your heart's chambers and valves are working. This procedure takes approximately one  hour. There are no restrictions for this procedure. [address] [address]    Follow-Up: At [facility], you and your health needs are our priority.  As part of our continuing mission to provide you with exceptional heart care, we have created designated Provider Care Teams.  These Care Teams include your  primary Cardiologist (physician) and Advanced Practice Providers (APPs -  Physician Assistants and Nurse Practitioners) who all work together to provide you with the care you need, when you need it.  We recommend signing up for the patient portal called "MyChart".  Sign up information is provided on this After Visit Summary.  MyChart is used to connect with patients for Virtual Visits (Telemedicine).  Patients are able to view lab/test results, encounter notes, upcoming appointments, etc.  Non-urgent messages can be sent to your provider as well.   To learn more about what you can do with MyChart, go to [email or web address].    Your next appointment:   6 week(s)  The format for your next appointment:   Virtual Visit   Provider:   Buford Dresser, MD     Signed, Buford Dresser, MD PhD 01/16/2021    [place]

## 2021-01-16 NOTE — Patient Instructions (Signed)
Medication Instructions:  Your Physician recommend you continue on your current medication as directed.    *If you need a refill on your cardiac medications before your next appointment, please call your pharmacy*   Lab Work: None  Testing/Procedures: Your physician has requested that you have an echocardiogram. Echocardiography is a painless test that uses sound waves to create images of your heart. It provides your doctor with information about the size and shape of your heart and how well your heart's chambers and valves are working. This procedure takes approximately one hour. There are no restrictions for this procedure. [place] [address]    Follow-Up: At [facility], you and your health needs are our priority.  As part of our continuing mission to provide you with exceptional heart care, we have created designated Provider Care Teams.  These Care Teams include your primary Cardiologist (physician) and Advanced Practice Providers (APPs -  Physician Assistants and Nurse Practitioners) who all work together to provide you with the care you need, when you need it.  We recommend signing up for the patient portal called "MyChart".  Sign up information is provided on this After Visit Summary.  MyChart is used to connect with patients for Virtual Visits (Telemedicine).  Patients are able to view lab/test results, encounter notes, upcoming appointments, etc.  Non-urgent messages can be sent to your provider as well.   To learn more about what you can do with MyChart, go to [email or web address].    Your next appointment:   6 week(s)  The format for your next appointment:   Virtual Visit   Provider:   Buford Dresser, MD

## 2021-01-17 ENCOUNTER — Telehealth: Payer: Self-pay | Admitting: Primary Care

## 2021-01-17 NOTE — Telephone Encounter (Signed)
Anita [name], can we call the patient and her daughter to find out if she is willing to see a pulmonologist? Tell them I spoke with her cardiologist and we both agree she needs to be seen. Okay to place referral if she agrees.

## 2021-01-17 NOTE — Telephone Encounter (Signed)
-----   Message from Buford Dresser, MD sent at 01/16/2021  6:22 PM EST ----- Regarding: visit today I saw Anita [name] today. Her legs looked better, but she was very dyspneic despite her typical O2 sats. I'm getting a repeat echo, but I think your instincts about referral to pulmonology are good. I'd refer and order PFTs to see if they have a regimen that would make her feel better. I think we are really limited from the cardiac side, but she is so tenuous that even a slight improvement in her breathing might make her feel better. Daughter did say coughing was better on antibiotics.   Thanks! Bridgette Harrell Gave

## 2021-01-17 NOTE — Telephone Encounter (Signed)
Mail box full not able to leave v/m at this time.

## 2021-01-25 NOTE — Telephone Encounter (Signed)
Mailbox full

## 2021-01-28 NOTE — Telephone Encounter (Signed)
Have called x 3 mail box full no call back would you like me to send message to call in my chart and letter by mail?

## 2021-01-29 ENCOUNTER — Other Ambulatory Visit: Payer: Self-pay

## 2021-01-29 ENCOUNTER — Other Ambulatory Visit: Payer: Self-pay | Admitting: Primary Care

## 2021-01-29 ENCOUNTER — Other Ambulatory Visit: Payer: Medicare Other

## 2021-01-29 DIAGNOSIS — E876 Hypokalemia: Secondary | ICD-10-CM

## 2021-01-29 LAB — BASIC METABOLIC PANEL
BUN: 17 mg/dL (ref 6–23)
CO2: 27 mEq/L (ref 19–32)
Calcium: 8.8 mg/dL (ref 8.4–10.5)
Chloride: 103 mEq/L (ref 96–112)
Creatinine, Ser: 1.07 mg/dL (ref 0.40–1.20)
GFR: 57.48 mL/min — ABNORMAL LOW (ref >60.00)
Glucose, Bld: 87 mg/dL (ref 70–99)
Potassium: 3.1 mEq/L — ABNORMAL LOW (ref 3.5–5.1)
Sodium: 141 mEq/L (ref 135–145)

## 2021-01-29 MED ORDER — POTASSIUM CHLORIDE CRYS ER 20 MEQ PO TBCR: 20.0000 meq | EXTENDED_RELEASE_TABLET | Freq: Two times a day (BID) | ORAL | 0 refills | Status: DC

## 2021-01-29 NOTE — Telephone Encounter (Signed)
Try sending a my chart message. It looks like she's active.

## 2021-02-04 NOTE — Telephone Encounter (Signed)
My chart has been sent to patient.

## 2021-02-07 ENCOUNTER — Ambulatory Visit (HOSPITAL_COMMUNITY): Payer: Medicare Other | Attending: Cardiology

## 2021-02-07 ENCOUNTER — Other Ambulatory Visit: Payer: Self-pay

## 2021-02-07 DIAGNOSIS — R0602 Shortness of breath: Secondary | ICD-10-CM

## 2021-02-07 LAB — ECHOCARDIOGRAM COMPLETE
Area-P 1/2: 4.29 cm2
MV M vel: 5.58 m/s
MV Peak grad: 124.5 mmHg
MV VTI: 0.64 cm2
P 1/2 time: 499 msec
Radius: 0.75 cm
S' Lateral: 4.4 cm

## 2021-02-08 ENCOUNTER — Other Ambulatory Visit: Payer: Self-pay | Admitting: Primary Care

## 2021-02-08 DIAGNOSIS — E876 Hypokalemia: Secondary | ICD-10-CM

## 2021-02-13 ENCOUNTER — Encounter: Payer: Self-pay | Admitting: Cardiology

## 2021-02-13 ENCOUNTER — Telehealth (INDEPENDENT_AMBULATORY_CARE_PROVIDER_SITE_OTHER): Payer: Medicare Other | Admitting: Cardiology

## 2021-02-13 VITALS — BP 162/98 | Ht 63.0 in | Wt 148.0 lb

## 2021-02-13 DIAGNOSIS — I051 Rheumatic mitral insufficiency: Secondary | ICD-10-CM

## 2021-02-13 DIAGNOSIS — I2510 Atherosclerotic heart disease of native coronary artery without angina pectoris: Secondary | ICD-10-CM | POA: Diagnosis not present

## 2021-02-13 DIAGNOSIS — I5042 Chronic combined systolic (congestive) and diastolic (congestive) heart failure: Secondary | ICD-10-CM | POA: Diagnosis not present

## 2021-02-13 DIAGNOSIS — R0602 Shortness of breath: Secondary | ICD-10-CM | POA: Diagnosis not present

## 2021-02-13 DIAGNOSIS — I255 Ischemic cardiomyopathy: Secondary | ICD-10-CM

## 2021-02-13 NOTE — Patient Instructions (Signed)
Medication Instructions:  Your Physician recommend you continue on your current medication as directed.    *If you need a refill on your cardiac medications before your next appointment, please call your pharmacy*   Lab Work: None   Testing/Procedures: None   Follow-Up: At [facility], you and your health needs are our priority.  As part of our continuing mission to provide you with exceptional heart care, we have created designated Provider Care Teams.  These Care Teams include your primary Cardiologist (physician) and Advanced Practice Providers (APPs -  Physician Assistants and Nurse Practitioners) who all work together to provide you with the care you need, when you need it.  We recommend signing up for the patient portal called "MyChart".  Sign up information is provided on this After Visit Summary.  MyChart is used to connect with patients for Virtual Visits (Telemedicine).  Patients are able to view lab/test results, encounter notes, upcoming appointments, etc.  Non-urgent messages can be sent to your provider as well.   To learn more about what you can do with MyChart, go to [email or web address].    Your next appointment:   March 22, 2021 @ 9:40 am  The format for your next appointment:   In Person  Provider:   Buford Dresser, MD

## 2021-02-13 NOTE — Progress Notes (Signed)
Virtual Visit via Telephone Note   This visit type was conducted due to national recommendations for restrictions regarding the COVID-19 Pandemic (e.g. social distancing) in an effort to limit this patient's exposure and mitigate transmission in our community.  Due to her co-morbid illnesses, this patient is at least at moderate risk for complications without adequate follow up.  This format is felt to be most appropriate for this patient at this time.  The patient did not have access to video technology/had technical difficulties with video requiring transitioning to audio format only (telephone).  All issues noted in this document were discussed and addressed.  No physical exam could be performed with this format.  Please refer to the patient's chart for her  consent to telehealth for [facility].   The patient was identified using 2 identifiers.  Patient Location: Home Provider Location: Office/Clinic  History of Present Illness:    Anita [name] is a 60 y.o. female with history of multiple sclerosis, CAD, likely COPD, prior tobacco use. I met her in the hospital 03/2020, see workup below.  Today: Virtual visit to discuss results of echo. Daughter present on call.   Has appt with GI at the end of this month. Has had issues with gagging/food getting stuck.  Breathing is improved. She stopped doing advair as she didn't like it. Found her albuterol inhaler and is using that instead, but not as frequently as before. Has had significant improvement in legs in terms of swelling.   Hasnt been monitoring BP frequently, todays reading just when she took her medications.  Ros positive for poking sensation about once/week, no clear triggers.   Has appt with me scheduled 03/12/21. Wants to talk to her brother. Discussed goals of care, planning for the future.   Denies chest pain. No PND, orthopnea, LE edema or unexpected weight gain. No syncope or palpitations.   Past Medical History:   Diagnosis Date   Abnormality of gait 07/26/2013   Acute respiratory distress 03/20/2020   Acute respiratory failure with hypoxia (HCC) 03/20/2020   Allergy    Arthritis    Atypical pneumonia 03/22/2020   Chickenpox    Elevated troponin 03/22/2020   GERD (gastroesophageal reflux disease)    Headache(784.0)    Migraine   Hearing loss    Right ear secondary to infection   History of shingles    Migraines    Multiple sclerosis (HCC)    Obese    Optic neuritis    Prolonged QT interval 03/20/2020    Past Surgical History:  Procedure Laterality Date   COLONOSCOPY WITH PROPOFOL N/A 08/03/2020   Procedure: COLONOSCOPY WITH PROPOFOL;  Surgeon: [name], MD;  Location: [facility] ENDOSCOPY;  Service: Gastroenterology;  Laterality: N/A;   Ganglioneuroma     Resection   LEFT HEART CATH AND CORONARY ANGIOGRAPHY N/A 03/23/2020   Procedure: LEFT HEART CATH AND CORONARY ANGIOGRAPHY;  Surgeon: Leonie Man, MD;  Location: [facility];  Service: Cardiovascular;  Laterality: N/A;   PILONIDAL CYST EXCISION     PILONIDAL CYST EXCISION  1983   TUMOR REMOVAL  2007/2008    Current Medications: Current Outpatient Medications on File Prior to Visit  Medication Sig   acetaminophen (TYLENOL) 325 MG tablet Take 2 tablets (650 mg total) by mouth every 6 (six) hours as needed for mild pain (or Fever >/= 101).   albuterol (VENTOLIN HFA) 108 (90 Base) MCG/ACT inhaler Inhale 1-2 puffs into the lungs every 6 (six) hours as needed  for wheezing or shortness of breath. Use sparingly!   aspirin EC 81 MG EC tablet Take 1 tablet (81 mg total) by mouth daily.   calcium carbonate (TUMS EX) 750 MG chewable tablet Chew 1 tablet by mouth daily as needed for heartburn.   carvedilol (COREG) 3.125 MG tablet TAKE 1 TABLET (3.125 MG TOTAL) BY MOUTH 2 (TWO) TIMES DAILY WITH A MEAL.   cetirizine (ZYRTEC) 10 MG tablet Take 10 mg by mouth daily.   Doxylamine Succinate, Sleep, (SLEEP AID PO) Take 1 tablet by mouth at bedtime  as needed (sleep).    feeding supplement, ENSURE ENLIVE, (ENSURE ENLIVE) LIQD Take 237 mLs by mouth 2 (two) times daily between meals.   ferrous sulfate 325 (65 FE) MG EC tablet TAKE 1 TABLET (325 MG TOTAL) BY MOUTH 2 (TWO) TIMES DAILY WITH A MEAL.   Fluticasone-Salmeterol (ADVAIR DISKUS) 250-50 MCG/DOSE AEPB Inhale 1 puff into the lungs 2 (two) times daily.   guaiFENesin (MUCINEX) 600 MG 12 hr tablet Take 600 mg by mouth 2 (two) times daily as needed for cough.   losartan (COZAAR) 25 MG tablet Take 0.5 tablets (12.5 mg total) by mouth daily. For blood pressure.   mirtazapine (REMERON) 15 MG tablet Take 1 tablet (15 mg total) by mouth at bedtime.   Multiple Vitamin (MULTIVITAMIN WITH MINERALS) TABS tablet Take 1 tablet by mouth daily.   nitroGLYCERIN (NITROSTAT) 0.4 MG SL tablet as needed.   potassium chloride SA (KLOR-CON) 20 MEQ tablet Take 1 tablet (20 mEq total) by mouth 2 (two) times daily. For low potassium.   Probiotic Product (PROBIOTIC ADVANCED PO) Take by mouth.   rosuvastatin (CRESTOR) 20 MG tablet Take 1 tablet (20 mg total) by mouth daily. For cholesterol.   SUMAtriptan (IMITREX) 25 MG tablet Take 1 tablet by mouth at migraine onset, may repeat in 2 hours if headache persists or recurs. Do not exceed 2 tablets in 24 hours.   torsemide (DEMADEX) 20 MG tablet Take 1 tablet (20 mg total) by mouth daily as needed. For leg or abdomen swelling   vitamin B-12 (CYANOCOBALAMIN) 1000 MCG tablet Take 1,000 mcg by mouth 2 (two) times a week.   No current facility-administered medications on file prior to visit.     Allergies:   Acthar hp [corticotropin], Codeine, and Prednisone   Social History   Tobacco Use   Smoking status: Former Smoker    Packs/day: 1.00    Types: Cigarettes   Smokeless tobacco: Never Used  Vaping Use   Vaping Use: Never used  Substance Use Topics   Alcohol use: Yes    Comment: Occasional    Family History: family history includes Bipolar disorder in her  sister; Heart disease in her brother; Lung cancer in her father; Skin cancer in her mother; Throat cancer in her paternal grandmother; Uterine cancer in her sister.  ROS:   Please see the history of present illness.  Additional pertinent ROS otherwise unremarkable.    EKGs/Labs/Other Studies Reviewed:    The following studies were reviewed today: Echo 02/07/21 1. Left ventricular ejection fraction, by estimation, is 40 to 45%. Left  ventricular ejection fraction by 3D volume is 47 %. The left ventricle has  mildly decreased function. The left ventricle demonstrates global  hypokinesis. Left ventricular diastolic   parameters are consistent with Grade II diastolic dysfunction  (pseudonormalization). Elevated left ventricular end-diastolic pressure.   2. Right ventricular systolic function is severely reduced. The right  ventricular size is mildly enlarged. There is  severely elevated pulmonary  artery systolic pressure. The estimated right ventricular systolic  pressure is 81.1 mmHg.   3. Left atrial size was moderately dilated.   4. Right atrial size was severely dilated.   5. The mitral valve is degenerative with mild to moderate thickening of  the mitral valve leaflets. There is teathering of the MV leaflets due to  mild LV dysfunction. Severe mitral valve regurgitation. Mild to moderate  mitral stenosis. The mean mitral  valve gradient is 6.5 mmHg.   6. Tricuspid valve regurgitation is moderate to severe.   7. The aortic valve is tricuspid. Aortic valve regurgitation is mild.  Mild aortic valve sclerosis is present, with no evidence of aortic valve  stenosis. Aortic regurgitation PHT measures 499 msec.   8. The inferior vena cava is dilated in size with <50% respiratory  variability, suggesting right atrial pressure of 15 mmHg.   9. There is a trivial pericardial effusion posterior to the left  ventricle.  10. Compared to prior echo, Mitral regurgitation appears worse and RV   dysfunction is now present. There is severe Pulmonary HTN.   Echo 03/20/20  1. Left ventricular ejection fraction, by estimation, is 40 to 45%. The  left ventricle has mildly decreased function. The left ventricle  demonstrates global hypokinesis. The left ventricular internal cavity size  was mildly dilated. Left ventricular  diastolic function could not be evaluated.   2. Right ventricular systolic function is normal. The right ventricular  size is normal. There is moderately elevated pulmonary artery systolic  pressure.   3. Left atrial size was mildly dilated.   4. Moderate mitral subvalvular thickening/fibrosis.   5. The mitral valve is rheumatic. Moderate to severe mitral valve  regurgitation. Mild mitral stenosis. The mean mitral valve gradient is 7.3  mmHg with average heart rate of 105 bpm.   6. The aortic valve is normal in structure. Aortic valve regurgitation is  not visualized.   7. The inferior vena cava is dilated in size with <50% respiratory  variability, suggesting right atrial pressure of 15 mmHg.   Cath 03/23/20 Hemodynamics: LV end diastolic pressure is moderately elevated. ----Coronary Angiography----- Ost RCA to Dist RCA lesion is 100% stenosed.-The PDA and PL system fills via faint collaterals from the AV groove LCx and LAD septals. Mid LAD-1 lesion is 60% stenosed. Mid LAD-2 lesion is 50% stenosed. Dist LAD lesion is 55% stenosed with 70% stenosed side branch in 2nd Diag. 1st Diag lesion is 70% stenosed. Ost Cx to Prox Cx lesion is 45% stenosed. Prox-MID Cx lesion is 65% stenosed.   MODERATE-SEVERE THREE-VESSEL CAD: 100% proximal RCA occlusion with left-to-right collaterals faintly filling PDA and PL system (AV groove LCx-PL and LAD septal-PDA) Diffuse moderate mid LAD disease with 60% to 50% stenosis. Tandem 50% and 65% proximal and mid LCx with the 65% lesion being the most significant lesion. Moderately elevated LVEDP of 18-20 mmHg   Given the extent of  disease in the LAD and LCx, neither lesion is very amenable to PCI, and RCA is chronically occluded.  Given her comorbidities, I do not think she would be a good CABG candidate especially in light of the fact that she would likely require valve surgery as well.  She would not recover well.   Recommendations aggressive medical management.  EKG:  EKG is personally reviewed.  The ekg ordered today demonstrates sinus tachycardia with pulmonary disease pattern  Recent Labs: 03/20/2020: B Natriuretic Peptide 1,255.6 08/14/2020: TSH 1.437 09/12/2020: ALT 14 09/13/2020:  Magnesium 2.1 01/07/2021: Hemoglobin 11.4; Platelets 193 01/29/2021: BUN 17; Creatinine, Ser 1.07; Potassium 3.1; Sodium 141  Recent Lipid Panel    Component Value Date/Time   CHOL 104 01/08/2021 1016   TRIG 66.0 01/08/2021 1016   HDL 40.20 01/08/2021 1016   CHOLHDL 3 01/08/2021 1016   VLDL 13.2 01/08/2021 1016   LDLCALC 51 01/08/2021 1016    Physical Exam:    VS:  BP (!) 162/98   Ht _0  (1.6 m)   Wt 148 lb (67.1 kg)   BMI 26.22 kg/m     Wt Readings from Last 3 Encounters:  02/13/21 148 lb (67.1 kg)  01/16/21 145 lb 12.8 oz (66.1 kg)  01/08/21 140 lb 6.4 oz (63.7 kg)    Speaking comfortably on the phone, no audible wheezing In no acute distress Alert and oriented Normal affect Normal speech   ASSESSMENT:    1. SOB (shortness of breath)   2. Ischemic cardiomyopathy   3. Chronic combined systolic and diastolic heart failure ([place])   4. Coronary artery disease involving native coronary artery of native heart without angina pectoris   5. Rheumatic mitral regurgitation    PLAN:    Shortness of breath: -difficult, and likely multifactorial -known cardiomyopathy, known mitral regurgitation, and being treated for COPD. Severe pulmonary hypertension on echo -her edema did improve with diuretics; better though still present -she is not a candidate for open heart surgery given her overall frailty. Mitraclip not an  option due to mitral stenosis -we have limited options from a cardiac perspective -she has now quit smoking, but has likely underlying COPD and is on inhalers -she is being monitored closely by her PCP Alma Friendly.  -we reviewed echo results. Overall we discussed that her prognosis long term is poor, though there is no "normal" timeline. We can manage symptoms, but we do not have a good option for management of her underlying cardiac issues. We discussed goals of care. She wants to talk to her family more about this.  Cardiomyopathy, ischemic. EF most recently 09-32% Chronic systolic and diastolic heart failure CAD with CTO RCA -tolerating aspirin 81 mg daily -continue rosuvastatin 20 mg daily for hypercholesterolemia -tolerating carvedilol 3.125 mg BID and losartan 12.5 mg daily  Severe mitral regurgitation with rheumatic appearing mitral valve, with moderate mitral stenosis and severe pulmonary hypertension -thickened leaflets with rheumatic movement -mean gradient ~7 mmHg  Plan for follow up: 4-6 weeks or sooner as needed  Today, I have spent 24 minutes with the patient with telehealth technology discussing the above problems.  Additional time spent in chart review, documentation, and communication.   Buford Dresser, MD, PhD, [facility]   Medication Adjustments/Labs and Tests Ordered: Current medicines are reviewed at length with the patient today.  Concerns regarding medicines are outlined above.  No orders of the defined types were placed in this encounter.  No orders of the defined types were placed in this encounter.   Patient Instructions  Medication Instructions:  Your Physician recommend you continue on your current medication as directed.    *If you need a refill on your cardiac medications before your next appointment, please call your pharmacy*   Lab Work: None   Testing/Procedures: None   Follow-Up: At [facility], you  and your health needs are our priority.  As part of our continuing mission to provide you with exceptional heart care, we have created designated Provider Care Teams.  These Care Teams include your primary  Cardiologist (physician) and Advanced Practice Providers (APPs -  Physician Assistants and Nurse Practitioners) who all work together to provide you with the care you need, when you need it.  We recommend signing up for the patient portal called "MyChart".  Sign up information is provided on this After Visit Summary.  MyChart is used to connect with patients for Virtual Visits (Telemedicine).  Patients are able to view lab/test results, encounter notes, upcoming appointments, etc.  Non-urgent messages can be sent to your provider as well.   To learn more about what you can do with MyChart, go to [email or web address].    Your next appointment:   March 22, 2021 @ 9:40 am  The format for your next appointment:   In Person  Provider:   Buford Dresser, MD    Signed, Buford Dresser, MD PhD 02/13/2021    [place]

## 2021-02-21 ENCOUNTER — Other Ambulatory Visit: Payer: Self-pay

## 2021-02-21 ENCOUNTER — Other Ambulatory Visit (INDEPENDENT_AMBULATORY_CARE_PROVIDER_SITE_OTHER): Payer: Medicare Other

## 2021-02-21 ENCOUNTER — Other Ambulatory Visit: Payer: Self-pay | Admitting: Primary Care

## 2021-02-21 DIAGNOSIS — E876 Hypokalemia: Secondary | ICD-10-CM | POA: Diagnosis not present

## 2021-02-21 LAB — BASIC METABOLIC PANEL
BUN: 14 mg/dL (ref 6–23)
CO2: 25 mEq/L (ref 19–32)
Calcium: 8.7 mg/dL (ref 8.4–10.5)
Chloride: 105 mEq/L (ref 96–112)
Creatinine, Ser: 1.04 mg/dL (ref 0.40–1.20)
GFR: 59.45 mL/min — ABNORMAL LOW (ref >60.00)
Glucose, Bld: 83 mg/dL (ref 70–99)
Potassium: 3.5 mEq/L (ref 3.5–5.1)
Sodium: 140 mEq/L (ref 135–145)

## 2021-02-21 MED ORDER — POTASSIUM CHLORIDE CRYS ER 20 MEQ PO TBCR: 20.0000 meq | EXTENDED_RELEASE_TABLET | Freq: Two times a day (BID) | ORAL | 3 refills | Status: DC

## 2021-02-28 ENCOUNTER — Ambulatory Visit: Payer: Medicare Other | Admitting: Gastroenterology

## 2021-03-04 ENCOUNTER — Encounter: Payer: Self-pay | Admitting: Gastroenterology

## 2021-03-04 ENCOUNTER — Ambulatory Visit (INDEPENDENT_AMBULATORY_CARE_PROVIDER_SITE_OTHER): Payer: Medicare Other | Admitting: Gastroenterology

## 2021-03-04 ENCOUNTER — Other Ambulatory Visit (HOSPITAL_COMMUNITY): Payer: Medicare Other

## 2021-03-04 ENCOUNTER — Other Ambulatory Visit: Payer: Self-pay

## 2021-03-04 VITALS — BP 170/116 | HR 120 | Ht 63.0 in | Wt 147.8 lb

## 2021-03-04 DIAGNOSIS — R1310 Dysphagia, unspecified: Secondary | ICD-10-CM | POA: Diagnosis not present

## 2021-03-04 DIAGNOSIS — I255 Ischemic cardiomyopathy: Secondary | ICD-10-CM

## 2021-03-04 MED ORDER — OMEPRAZOLE 40 MG PO CPDR: 40.0000 mg | DELAYED_RELEASE_CAPSULE | Freq: Every day | ORAL | 3 refills | Status: DC

## 2021-03-04 NOTE — Progress Notes (Signed)
[name] MD, MRCP(U.K) [address]  [place]  [place], [place] [address]  Main: [phone]  Fax: [phone]   Primary Care Physician: [name], NP  Primary Gastroenterologist:  Dr. [name]   Difficulty swallowing  HPI: Anita [name] is a 60 y.o. female   Summary of history : Initially referred and seen in July 2021 for normocytic anemia and rectal bleeding.In April 2021 admitted with atypical pneumonia and acute kidney injury. She also has a history of multiple sclerosis.Seen and evaluated by cardiology. Status post cardiac catheterization. RCA 100% stenosed.  05/03/2020: Hemoglobin 8.9 g MCV of 82.9. Hemoglobin was 9.2 g 3 months prior. MCV at that time was 94.8. Creatinine of 1.51  07/02/2020: Iron studies showed a normal ferritin of 91 TIBC was not elevated.,  Celiac serology was negative.  Could not obtain a B12 level due to insurance issues.  Vitamin C levels were normal. 08/03/2020 underwent a colonoscopy.  Diverticulosis of the sigmoid colon was noted and nonbleeding internal hemorrhoids were also noted which were large in nature but nonbleeding.  5 mm polyp was resected in the transverse colon with a cold snare.  It was a tubular adenoma.   Interval history  08/06/2020-03/04/2021  01/08/2021 seen by Dr. Tasia Catchings as an anemia due to a combination of iron deficiency as well as chronic kidney disease patient plan was to continue B12 and iron supplementation  In February 2022 seen by cardiology for shortness of breath which was felt to be likely multifactorial from cardiomyopathy and known mitral regurgitation which is moderate to severe, COPD  She states that over the past 6 months she has had gradual worsening of her swallowing.  Associated with coughing sometimes feel that the food is going the wrong way.  He is here to begin soft foods rather than hot foods.  She has had a few episodes of pneumonia.  Has speech stool has been changing over the  last few months.  Her daughter thinks that the speech start a change in first before the swallowing.  She states that her multiple sclerosis is progressing.  Not on any reflux medications.   Current Outpatient Medications  Medication Sig Dispense Refill  . acetaminophen (TYLENOL) 325 MG tablet Take 2 tablets (650 mg total) by mouth every 6 (six) hours as needed for mild pain (or Fever >/= 101).    Marland Kitchen albuterol (VENTOLIN HFA) 108 (90 Base) MCG/ACT inhaler Inhale 1-2 puffs into the lungs every 6 (six) hours as needed for wheezing or shortness of breath. Use sparingly! 18 each 0  . aspirin EC 81 MG EC tablet Take 1 tablet (81 mg total) by mouth daily.    . calcium carbonate (TUMS EX) 750 MG chewable tablet Chew 1 tablet by mouth daily as needed for heartburn.    . carvedilol (COREG) 3.125 MG tablet TAKE 1 TABLET (3.125 MG TOTAL) BY MOUTH 2 (TWO) TIMES DAILY WITH A MEAL. 180 tablet 2  . cetirizine (ZYRTEC) 10 MG tablet Take 10 mg by mouth daily.    . Doxylamine Succinate, Sleep, (SLEEP AID PO) Take 1 tablet by mouth at bedtime as needed (sleep).     . feeding supplement, ENSURE ENLIVE, (ENSURE ENLIVE) LIQD Take 237 mLs by mouth 2 (two) times daily between meals.    . ferrous sulfate 325 (65 FE) MG EC tablet TAKE 1 TABLET (325 MG TOTAL) BY MOUTH 2 (TWO) TIMES DAILY WITH A MEAL. 60 tablet 3  . Fluticasone-Salmeterol (ADVAIR DISKUS) 250-50 MCG/DOSE  AEPB Inhale 1 puff into the lungs 2 (two) times daily. 1 each 3  . guaiFENesin (MUCINEX) 600 MG 12 hr tablet Take 600 mg by mouth 2 (two) times daily as needed for cough.    . losartan (COZAAR) 25 MG tablet Take 0.5 tablets (12.5 mg total) by mouth daily. For blood pressure. 45 tablet 0  . mirtazapine (REMERON) 15 MG tablet Take 1 tablet (15 mg total) by mouth at bedtime. 90 tablet 1  . Multiple Vitamin (MULTIVITAMIN WITH MINERALS) TABS tablet Take 1 tablet by mouth daily.    . nitroGLYCERIN (NITROSTAT) 0.4 MG SL tablet as needed.    Marland Kitchen omeprazole (PRILOSEC) 40  MG capsule Take 1 capsule (40 mg total) by mouth daily. 90 capsule 3  . potassium chloride SA (KLOR-CON) 20 MEQ tablet Take 1 tablet (20 mEq total) by mouth 2 (two) times daily. For low potassium. 180 tablet 3  . Probiotic Product (PROBIOTIC ADVANCED PO) Take by mouth.    . rosuvastatin (CRESTOR) 20 MG tablet Take 1 tablet (20 mg total) by mouth daily. For cholesterol. 90 tablet 3  . SUMAtriptan (IMITREX) 25 MG tablet Take 1 tablet by mouth at migraine onset, may repeat in 2 hours if headache persists or recurs. Do not exceed 2 tablets in 24 hours. 10 tablet 0  . torsemide (DEMADEX) 20 MG tablet Take 1 tablet (20 mg total) by mouth daily as needed. For leg or abdomen swelling 30 tablet 11  . vitamin B-12 (CYANOCOBALAMIN) 1000 MCG tablet Take 1,000 mcg by mouth 2 (two) times a week.     No current facility-administered medications for this visit.    Allergies as of 03/04/2021 - Review Complete 03/04/2021  Allergen Reaction Noted  . Acthar hp [corticotropin] Other (See Comments) 06/28/2013  . Codeine    . Prednisone      ROS:  General: Negative for anorexia, weight loss, fever, chills, fatigue, weakness. ENT: Negative for hoarseness, difficulty swallowing , nasal congestion. CV: Negative for chest pain, angina, palpitations, dyspnea on exertion, peripheral edema.  Respiratory: Negative for dyspnea at rest, dyspnea on exertion, cough, sputum, wheezing.  GI: See history of present illness. GU:  Negative for dysuria, hematuria, urinary incontinence, urinary frequency, nocturnal urination.  Endo: Negative for unusual weight change.    Physical Examination:   BP (!) 170/116 (BP Location: Left Arm, Patient Position: Sitting, Cuff Size: Normal) Comment: pt. states she did not take her BP meds today.  Pulse (!) 120   Ht 5\' 3"  (1.6 m)   Wt 147 lb 12.8 oz (67 kg)   BMI 26.18 kg/m   General: Appears thin, weak, in a wheelchair, Lungs: Decreased air entry bilaterally Heart: Soft S1 systolic  murmur in the mitral area radiating to the axilla. Abdomen: Bowel sounds are normal, nontender, nondistended, no hepatosplenomegaly or masses, no abdominal bruits or hernia , no rebound or guarding.   Neuro: Alert and oriented x 3.   Skin: Warm and dry, no jaundice.   Psych: Alert and cooperative, normal mood and affect.   Imaging Studies: ECHOCARDIOGRAM COMPLETE  Result Date: 02/07/2021    ECHOCARDIOGRAM REPORT   Patient Name:   Anita [name] Date of Exam: 02/07/2021 Medical Rec #:  [id number]             Height:       63.0 in Accession #:    [id number]            Weight:       145.8 lb  Date of Birth:  [birth year]             BSA:          1.691 m Patient Age:    33 years              BP:           149/102 mmHg Patient Gender: F                     HR:           72 bpm. Exam Location:  [address] Procedure: 2D Echo, 3D Echo, Cardiac Doppler and Color Doppler Indications:    R06.00 SOB  History:        Patient has prior history of Echocardiogram examinations, most                 recent 03/20/2020. Ischemic cardiomyopathy and CHF, CAD and                 NSTEMI, COPD; Risk Factors:Hypertension.  Sonographer:    Marygrace Drought RCS Referring Phys: [id number] [place]  1. Left ventricular ejection fraction, by estimation, is 40 to 45%. Left ventricular ejection fraction by 3D volume is 47 %. The left ventricle has mildly decreased function. The left ventricle demonstrates global hypokinesis. Left ventricular diastolic  parameters are consistent with Grade II diastolic dysfunction (pseudonormalization). Elevated left ventricular end-diastolic pressure.  2. Right ventricular systolic function is severely reduced. The right ventricular size is mildly enlarged. There is severely elevated pulmonary artery systolic pressure. The estimated right ventricular systolic pressure is 45.6 mmHg.  3. Left atrial size was moderately dilated.  4. Right atrial size was severely dilated.  5. The mitral  valve is degenerative with mild to moderate thickening of the mitral valve leaflets. There is teathering of the MV leaflets due to mild LV dysfunction. Severe mitral valve regurgitation. Mild to moderate mitral stenosis. The mean mitral valve gradient is 6.5 mmHg.  6. Tricuspid valve regurgitation is moderate to severe.  7. The aortic valve is tricuspid. Aortic valve regurgitation is mild. Mild aortic valve sclerosis is present, with no evidence of aortic valve stenosis. Aortic regurgitation PHT measures 499 msec.  8. The inferior vena cava is dilated in size with <50% respiratory variability, suggesting right atrial pressure of 15 mmHg.  9. There is a trivial pericardial effusion posterior to the left ventricle. 10. Compared to prior echo, Mitral regurgitation appears worse and RV dysfunction is now present. There is severe Pulmonary HTN. FINDINGS  Left Ventricle: Left ventricular ejection fraction, by estimation, is 40 to 45%. Left ventricular ejection fraction by 3D volume is 47 %. The left ventricle has mildly decreased function. The left ventricle demonstrates global hypokinesis. The left ventricular internal cavity size was normal in size. There is no left ventricular hypertrophy. Left ventricular diastolic parameters are consistent with Grade II diastolic dysfunction (pseudonormalization). Elevated left ventricular end-diastolic pressure. Right Ventricle: The right ventricular size is mildly enlarged. No increase in right ventricular wall thickness. Right ventricular systolic function is severely reduced. There is severely elevated pulmonary artery systolic pressure. The tricuspid regurgitant velocity is 3.68 m/s, and with an assumed right atrial pressure of 15 mmHg, the estimated right ventricular systolic pressure is 25.6 mmHg. Left Atrium: Left atrial size was moderately dilated. Right Atrium: Right atrial size was severely dilated. Pericardium: Trivial pericardial effusion is present. The pericardial  effusion is posterior to the left ventricle. Mitral Valve: The mitral  valve is degenerative in appearance. There is moderate thickening of the mitral valve leaflet(s). Severe mitral valve regurgitation. Mild to moderate mitral valve stenosis. MV peak gradient, 18.3 mmHg. The mean mitral valve gradient is 6.5 mmHg. Tricuspid Valve: The tricuspid valve is normal in structure. Tricuspid valve regurgitation is moderate to severe. No evidence of tricuspid stenosis. Aortic Valve: The aortic valve is tricuspid. Aortic valve regurgitation is mild. Aortic regurgitation PHT measures 499 msec. Mild aortic valve sclerosis is present, with no evidence of aortic valve stenosis. Pulmonic Valve: The pulmonic valve was normal in structure. Pulmonic valve regurgitation is mild. No evidence of pulmonic stenosis. Aorta: The aortic root is normal in size and structure. Venous: The inferior vena cava is dilated in size with less than 50% respiratory variability, suggesting right atrial pressure of 15 mmHg. IAS/Shunts: No atrial level shunt detected by color flow Doppler.  LEFT VENTRICLE PLAX 2D LVIDd:         5.50 cm         Diastology LVIDs:         4.40 cm         LV e' medial:    4.90 cm/s LV PW:         1.50 cm         LV E/e' medial:  29.0 LV IVS:        1.10 cm         LV e' lateral:   7.51 cm/s LVOT diam:     1.80 cm         LV E/e' lateral: 18.9 LV SV:         30 LV SV Index:   18 LVOT Area:     2.54 cm        3D Volume EF                                LV 3D EF:    Left                                             ventricular                                             ejection                                             fraction by                                             3D volume                                             is 47 %.  3D Volume EF:                                3D EF:        47 % RIGHT VENTRICLE RV Basal diam:  4.00 cm RV S prime:     5.44 cm/s TAPSE (M-mode): 0.9 cm RVSP:            62.2 mmHg LEFT ATRIUM             Index       RIGHT ATRIUM           Index LA diam:        4.40 cm 2.60 cm/m  RA Pressure: 8.00 mmHg LA Vol (A2C):   86.0 ml 50.87 ml/m RA Area:     25.00 cm LA Vol (A4C):   60.5 ml 35.79 ml/m RA Volume:   82.20 ml  48.62 ml/m LA Biplane Vol: 71.9 ml 42.53 ml/m  AORTIC VALVE LVOT Vmax:   67.20 cm/s LVOT Vmean:  45.100 cm/s LVOT VTI:    0.117 m AI PHT:      499 msec  AORTA Ao Root diam: 3.20 cm Ao Asc diam:  3.60 cm MITRAL VALVE                 TRICUSPID VALVE MV Area (PHT): 4.29 cm      TR Peak grad:   54.2 mmHg MV Area VTI:   0.64 cm      TR Vmax:        368.00 cm/s MV Peak grad:  18.3 mmHg     Estimated RAP:  8.00 mmHg MV Mean grad:  6.5 mmHg      RVSP:           62.2 mmHg MV Vmax:       2.14 m/s MV Vmean:      116.0 cm/s    SHUNTS MV Decel Time: 177 msec      Systemic VTI:  0.12 m MR Peak grad:    124.5 mmHg  Systemic Diam: 1.80 cm MR Mean grad:    77.0 mmHg MR Vmax:         558.00 cm/s MR Vmean:        406.0 cm/s MR PISA:         3.53 cm MR PISA Eff ROA: 20 mm MR PISA Radius:  0.75 cm MV E velocity: 142.00 cm/s MV A velocity: 96.80 cm/s MV E/A ratio:  1.47 Fransico Him MD Electronically signed by Fransico Him MD Signature Date/Time: 02/07/2021/10:32:00 AM    Final     Assessment and Plan:   Anita [name] is a 60 y.o. y/o female has been sent back to see me for difficulty swallowing and choking on food.  With a history of multiple sclerosis is always a concern for oropharyngeal dysphagia and her history is correlating with a history suggestive of dysarthria as well.  Plan 1.    Obtain modified barium swallow as well as barium swallow with tablet 2.  Commence on Prilosec 40 mg once a day to empirically treat any esophageal dysmotility associated with acid reflux.  Due to recent history of shortness of breath and severe mitral regurgitation the risk of anesthesia is higher and I would wish to avoid unless absolutely needed with regards to any endoscopy  procedures  Dr [name]  MD,MRCP [facility]) Follow up in 4 to 5 weeks

## 2021-03-12 ENCOUNTER — Other Ambulatory Visit: Payer: Self-pay

## 2021-03-12 ENCOUNTER — Ambulatory Visit: Payer: Medicare Other | Admitting: Cardiology

## 2021-03-12 ENCOUNTER — Ambulatory Visit
Admission: RE | Admit: 2021-03-12 | Discharge: 2021-03-12 | Disposition: A | Discharge status: Home / Self Care | Payer: Medicare Other | Source: Ambulatory Visit | Attending: Gastroenterology | Admitting: Gastroenterology

## 2021-03-12 ENCOUNTER — Encounter: Payer: Self-pay | Admitting: Gastroenterology

## 2021-03-12 DIAGNOSIS — R1310 Dysphagia, unspecified: Secondary | ICD-10-CM | POA: Insufficient documentation

## 2021-04-04 ENCOUNTER — Telehealth (INDEPENDENT_AMBULATORY_CARE_PROVIDER_SITE_OTHER): Payer: Medicare Other | Admitting: Gastroenterology

## 2021-04-04 DIAGNOSIS — I255 Ischemic cardiomyopathy: Secondary | ICD-10-CM

## 2021-04-04 DIAGNOSIS — R1310 Dysphagia, unspecified: Secondary | ICD-10-CM | POA: Diagnosis not present

## 2021-04-04 NOTE — Progress Notes (Signed)
[name] , MD [address]  [place]  [place], [place] [address]  Main: [phone]  Fax: [phone]   Primary Care Physician: [name], NP  Virtual Visit via Video Note  I connected with patient on 04/04/21 at  1:00 PM EDT by video and verified that I am speaking with the correct person using two identifiers.   I discussed the limitations, risks, security and privacy concerns of performing an evaluation and management service by video  and the availability of in person appointments. I also discussed with the patient that there may be a patient responsible charge related to this service. The patient expressed understanding and agreed to proceed.  Location of Patient: Home Location of Provider: Home Persons involved: Patient and provider only   History of Present Illness:   Dysphagia follow up   HPI: Anita [name] is a 60 y.o. female   Summary of history : Initially referred and seen in July 2021 for normocytic anemia and rectal bleeding.  She also has a history of multiple sclerosis.Seen and evaluated by cardiology. Status post cardiac catheterization. RCA 100% stenosed.  05/03/2020: Hemoglobin 8.9 g MCV of 82.9. Hemoglobin was 9.2 g 3 months prior. MCV at that time was 94.8. Creatinine of 1.51  07/02/2020: Iron studies showed a normal ferritin of 91 TIBC was not elevated., Celiac serology was negative. Could not obtain a B12 level due to insurance issues. Vitamin C levels were normal. 08/03/2020 underwent a colonoscopy. Diverticulosis of the sigmoid colon was noted and nonbleeding internal hemorrhoids were also noted which were large in nature but nonbleeding. 5 mm polyp was resected in the transverse colon with a cold snare. It was a tubular adenoma.  01/08/2021 seen by Dr. Tasia Catchings as an anemia due to a combination of iron deficiency as well as chronic kidney disease patient plan was to continue B12 and iron supplementation  In February  2022 seen by cardiology for shortness of breath which was felt to be likely multifactorial from cardiomyopathy and known mitral regurgitation which is moderate to severe, COPD  Interval history3/28/2022-04/04/2021  03/12/2021: Barium swallow : no abnormality seen .   Issues with dysphagia for the past 6-8 months, much better after starting prilosec, less gagging.      Current Outpatient Medications  Medication Sig Dispense Refill  . acetaminophen (TYLENOL) 325 MG tablet Take 2 tablets (650 mg total) by mouth every 6 (six) hours as needed for mild pain (or Fever >/= 101).    Marland Kitchen albuterol (VENTOLIN HFA) 108 (90 Base) MCG/ACT inhaler Inhale 1-2 puffs into the lungs every 6 (six) hours as needed for wheezing or shortness of breath. Use sparingly! 18 each 0  . aspirin EC 81 MG EC tablet Take 1 tablet (81 mg total) by mouth daily.    . calcium carbonate (TUMS EX) 750 MG chewable tablet Chew 1 tablet by mouth daily as needed for heartburn.    . carvedilol (COREG) 3.125 MG tablet TAKE 1 TABLET (3.125 MG TOTAL) BY MOUTH 2 (TWO) TIMES DAILY WITH A MEAL. 180 tablet 2  . cetirizine (ZYRTEC) 10 MG tablet Take 10 mg by mouth daily.    . Doxylamine Succinate, Sleep, (SLEEP AID PO) Take 1 tablet by mouth at bedtime as needed (sleep).     . feeding supplement, ENSURE ENLIVE, (ENSURE ENLIVE) LIQD Take 237 mLs by mouth 2 (two) times daily between meals.    . ferrous sulfate 325 (65 FE) MG EC tablet TAKE 1 TABLET (325  MG TOTAL) BY MOUTH 2 (TWO) TIMES DAILY WITH A MEAL. 60 tablet 3  . Fluticasone-Salmeterol (ADVAIR DISKUS) 250-50 MCG/DOSE AEPB Inhale 1 puff into the lungs 2 (two) times daily. 1 each 3  . guaiFENesin (MUCINEX) 600 MG 12 hr tablet Take 600 mg by mouth 2 (two) times daily as needed for cough.    . losartan (COZAAR) 25 MG tablet Take 0.5 tablets (12.5 mg total) by mouth daily. For blood pressure. 45 tablet 0  . mirtazapine (REMERON) 15 MG tablet Take 1 tablet (15 mg total) by mouth at bedtime. 90  tablet 1  . Multiple Vitamin (MULTIVITAMIN WITH MINERALS) TABS tablet Take 1 tablet by mouth daily.    . nitroGLYCERIN (NITROSTAT) 0.4 MG SL tablet as needed.    Marland Kitchen omeprazole (PRILOSEC) 40 MG capsule Take 1 capsule (40 mg total) by mouth daily. 90 capsule 3  . potassium chloride SA (KLOR-CON) 20 MEQ tablet Take 1 tablet (20 mEq total) by mouth 2 (two) times daily. For low potassium. 180 tablet 3  . Probiotic Product (PROBIOTIC ADVANCED PO) Take by mouth.    . rosuvastatin (CRESTOR) 20 MG tablet Take 1 tablet (20 mg total) by mouth daily. For cholesterol. 90 tablet 3  . SUMAtriptan (IMITREX) 25 MG tablet Take 1 tablet by mouth at migraine onset, may repeat in 2 hours if headache persists or recurs. Do not exceed 2 tablets in 24 hours. 10 tablet 0  . torsemide (DEMADEX) 20 MG tablet Take 1 tablet (20 mg total) by mouth daily as needed. For leg or abdomen swelling 30 tablet 11  . vitamin B-12 (CYANOCOBALAMIN) 1000 MCG tablet Take 1,000 mcg by mouth 2 (two) times a week.     No current facility-administered medications for this visit.    Allergies as of 04/04/2021 - Review Complete 03/04/2021  Allergen Reaction Noted  . Acthar hp [corticotropin] Other (See Comments) 06/28/2013  . Codeine    . Prednisone      Review of Systems:    All systems reviewed and negative except where noted in HPI.  General Appearance:    Alert, cooperative, no distress, appears stated age  Head:    Normocephalic, without obvious abnormality, atraumatic  Eyes:    PERRL, conjunctiva/corneas clear,  Ears:    Grossly normal hearing    Neurologic:  Grossly normal    Observations/Objective:  Labs: CMP     Component Value Date/Time   NA 140 02/21/2021 1125   K 3.5 02/21/2021 1125   CL 105 02/21/2021 1125   CO2 25 02/21/2021 1125   GLUCOSE 83 02/21/2021 1125   BUN 14 02/21/2021 1125   CREATININE 1.04 02/21/2021 1125   CREATININE 1.26 (H) 06/22/2020 1550   CALCIUM 8.7 02/21/2021 1125   PROT 7.1 09/12/2020  2309   ALBUMIN 4.0 09/12/2020 2309   AST 22 09/12/2020 2309   ALT 14 09/12/2020 2309   ALKPHOS 47 09/12/2020 2309   BILITOT 1.0 09/12/2020 2309   GFRNONAA 49 (L) 09/12/2020 2309   GFRAA >60 08/14/2020 1150   Lab Results  Component Value Date   WBC 7.2 01/07/2021   HGB 11.4 (L) 01/07/2021   HCT 36.2 01/07/2021   MCV 88.1 01/07/2021   PLT 193 01/07/2021    Imaging Studies: DG ESOPHAGUS W SINGLE CM (SOL OR THIN BA)  Result Date: 03/12/2021 CLINICAL DATA:  Dysphagia, history of multiple sclerosis EXAM: ESOPHOGRAM/BARIUM SWALLOW TECHNIQUE: Single contrast examination was performed using barium. The patient was observed with fluoroscopy swallowing a 13 mm  barium sulphate tablet. FLUOROSCOPY TIME:  Fluoroscopy Time:  1 minutes Radiation Exposure Index (if provided by the fluoroscopic device): 8 mGy COMPARISON:  None. FINDINGS: Technically limited as patient could not stand and study was performed in a partially recumbent position. Patient swallowed barium without difficulty. No aspiration. There is no mass, stricture, or other abnormality identified. Normal motility. No hiatal hernia. No evidence of spontaneous gastroesophageal reflux. Barium pill initially held up at level of mid esophagus but cleared with additional swallows water/contrast. Symptoms were not reliably reproduced during this study. IMPRESSION: No significant abnormality identified. Electronically Signed   By: Macy Mis M.D.   On: 03/12/2021 12:23    Assessment and Plan:   Anita [name] is a 60 y.o. y/o female here to follow up for  swallowing and choking on food.  With a history of multiple sclerosis is always a concern for oropharyngeal dysphagia and her history is correlating with a history suggestive of dysarthria as well.Normal barium swallow and significant improvement after starting  PPI   Plan 1.  Obtain modified barium swallow  2.  Continue  on Prilosec 40 mg once a day to empirically treat any  esophageal dysmotility associated with acid reflux.     F/u in 6-8 weeks    I discussed the assessment and treatment plan with the patient. The patient was provided an opportunity to ask questions and all were answered. The patient agreed with the plan and demonstrated an understanding of the instructions.   The patient was advised to call back or seek an in-person evaluation if the symptoms worsen or if the condition fails to improve as anticipated.  I provided 18 minutes of face-to-face time during this encounter.  Dr [name] MD,MRCP [facility]) Gastroenterology/Hepatology Pager: [phone]   Speech recognition software was used to dictate this note.

## 2021-04-04 NOTE — Addendum Note (Signed)
Addended by: Storm Frisk on: 04/04/2021 01:23 PM   Modules accepted: Orders

## 2021-04-09 ENCOUNTER — Other Ambulatory Visit: Payer: Self-pay | Admitting: Primary Care

## 2021-04-09 DIAGNOSIS — I10 Essential (primary) hypertension: Secondary | ICD-10-CM

## 2021-04-12 ENCOUNTER — Ambulatory Visit: Payer: Medicare Other | Admitting: Cardiology

## 2021-04-23 ENCOUNTER — Ambulatory Visit: Payer: Medicare Other | Attending: Gastroenterology

## 2021-05-08 ENCOUNTER — Ambulatory Visit: Payer: Medicare Other | Admitting: Medical

## 2021-05-14 ENCOUNTER — Other Ambulatory Visit: Payer: Self-pay | Admitting: Oncology

## 2021-05-18 ENCOUNTER — Inpatient Hospital Stay
Admission: EM | Admit: 2021-05-18 | Discharge: 2021-05-28 | DRG: 291 | Disposition: A | Discharge status: Home Health | Payer: Medicare Other | Attending: Internal Medicine | Admitting: Internal Medicine

## 2021-05-18 ENCOUNTER — Emergency Department: Payer: Medicare Other

## 2021-05-18 ENCOUNTER — Other Ambulatory Visit: Payer: Self-pay

## 2021-05-18 DIAGNOSIS — J9621 Acute and chronic respiratory failure with hypoxia: Secondary | ICD-10-CM | POA: Diagnosis not present

## 2021-05-18 DIAGNOSIS — Z885 Allergy status to narcotic agent status: Secondary | ICD-10-CM

## 2021-05-18 DIAGNOSIS — R601 Generalized edema: Secondary | ICD-10-CM

## 2021-05-18 DIAGNOSIS — I255 Ischemic cardiomyopathy: Secondary | ICD-10-CM | POA: Diagnosis present

## 2021-05-18 DIAGNOSIS — Z20822 Contact with and (suspected) exposure to covid-19: Secondary | ICD-10-CM | POA: Diagnosis not present

## 2021-05-18 DIAGNOSIS — I2582 Chronic total occlusion of coronary artery: Secondary | ICD-10-CM | POA: Diagnosis present

## 2021-05-18 DIAGNOSIS — E876 Hypokalemia: Secondary | ICD-10-CM

## 2021-05-18 DIAGNOSIS — R220 Localized swelling, mass and lump, head: Secondary | ICD-10-CM

## 2021-05-18 DIAGNOSIS — I10 Essential (primary) hypertension: Secondary | ICD-10-CM | POA: Diagnosis present

## 2021-05-18 DIAGNOSIS — I110 Hypertensive heart disease with heart failure: Principal | ICD-10-CM | POA: Diagnosis present

## 2021-05-18 DIAGNOSIS — J90 Pleural effusion, not elsewhere classified: Secondary | ICD-10-CM | POA: Diagnosis not present

## 2021-05-18 DIAGNOSIS — R0602 Shortness of breath: Secondary | ICD-10-CM | POA: Diagnosis not present

## 2021-05-18 DIAGNOSIS — E785 Hyperlipidemia, unspecified: Secondary | ICD-10-CM | POA: Diagnosis present

## 2021-05-18 DIAGNOSIS — Z7982 Long term (current) use of aspirin: Secondary | ICD-10-CM

## 2021-05-18 DIAGNOSIS — I2510 Atherosclerotic heart disease of native coronary artery without angina pectoris: Secondary | ICD-10-CM | POA: Diagnosis present

## 2021-05-18 DIAGNOSIS — J449 Chronic obstructive pulmonary disease, unspecified: Secondary | ICD-10-CM | POA: Diagnosis present

## 2021-05-18 DIAGNOSIS — I517 Cardiomegaly: Secondary | ICD-10-CM | POA: Diagnosis not present

## 2021-05-18 DIAGNOSIS — I340 Nonrheumatic mitral (valve) insufficiency: Secondary | ICD-10-CM

## 2021-05-18 DIAGNOSIS — R5381 Other malaise: Secondary | ICD-10-CM | POA: Diagnosis present

## 2021-05-18 DIAGNOSIS — N179 Acute kidney failure, unspecified: Secondary | ICD-10-CM | POA: Diagnosis present

## 2021-05-18 DIAGNOSIS — I5043 Acute on chronic combined systolic (congestive) and diastolic (congestive) heart failure: Secondary | ICD-10-CM

## 2021-05-18 DIAGNOSIS — I2721 Secondary pulmonary arterial hypertension: Secondary | ICD-10-CM | POA: Diagnosis not present

## 2021-05-18 DIAGNOSIS — R0902 Hypoxemia: Secondary | ICD-10-CM

## 2021-05-18 DIAGNOSIS — I081 Rheumatic disorders of both mitral and tricuspid valves: Secondary | ICD-10-CM | POA: Diagnosis present

## 2021-05-18 DIAGNOSIS — K219 Gastro-esophageal reflux disease without esophagitis: Secondary | ICD-10-CM | POA: Diagnosis present

## 2021-05-18 DIAGNOSIS — I5023 Acute on chronic systolic (congestive) heart failure: Secondary | ICD-10-CM

## 2021-05-18 DIAGNOSIS — N390 Urinary tract infection, site not specified: Secondary | ICD-10-CM

## 2021-05-18 DIAGNOSIS — Z9181 History of falling: Secondary | ICD-10-CM

## 2021-05-18 DIAGNOSIS — Z888 Allergy status to other drugs, medicaments and biological substances status: Secondary | ICD-10-CM

## 2021-05-18 DIAGNOSIS — I509 Heart failure, unspecified: Secondary | ICD-10-CM

## 2021-05-18 DIAGNOSIS — Z87891 Personal history of nicotine dependence: Secondary | ICD-10-CM

## 2021-05-18 DIAGNOSIS — D649 Anemia, unspecified: Secondary | ICD-10-CM | POA: Diagnosis present

## 2021-05-18 DIAGNOSIS — R600 Localized edema: Secondary | ICD-10-CM | POA: Diagnosis not present

## 2021-05-18 DIAGNOSIS — Z79899 Other long term (current) drug therapy: Secondary | ICD-10-CM

## 2021-05-18 DIAGNOSIS — I248 Other forms of acute ischemic heart disease: Secondary | ICD-10-CM | POA: Diagnosis present

## 2021-05-18 DIAGNOSIS — G35 Multiple sclerosis: Secondary | ICD-10-CM | POA: Diagnosis present

## 2021-05-18 DIAGNOSIS — I272 Pulmonary hypertension, unspecified: Secondary | ICD-10-CM | POA: Diagnosis present

## 2021-05-18 LAB — CBC WITH DIFFERENTIAL/PLATELET
Abs Immature Granulocytes: 0.04 10*3/uL (ref 0.00–0.07)
Basophils Absolute: 0 10*3/uL (ref 0.0–0.1)
Basophils Relative: 0 %
Eosinophils Absolute: 0 10*3/uL (ref 0.0–0.5)
Eosinophils Relative: 0 %
HCT: 33.1 % — ABNORMAL LOW (ref 36.0–46.0)
Hemoglobin: 10.6 g/dL — ABNORMAL LOW (ref 12.0–15.0)
Immature Granulocytes: 0 %
Lymphocytes Relative: 4 %
Lymphs Abs: 0.5 10*3/uL — ABNORMAL LOW (ref 0.7–4.0)
MCH: 28.3 pg (ref 26.0–34.0)
MCHC: 32 g/dL (ref 30.0–36.0)
MCV: 88.5 fL (ref 80.0–100.0)
Monocytes Absolute: 1.1 10*3/uL — ABNORMAL HIGH (ref 0.1–1.0)
Monocytes Relative: 9 %
Neutro Abs: 10.1 10*3/uL — ABNORMAL HIGH (ref 1.7–7.7)
Neutrophils Relative %: 87 %
Platelets: 310 10*3/uL (ref 150–400)
RBC: 3.74 MIL/uL — ABNORMAL LOW (ref 3.87–5.11)
RDW: 16.3 % — ABNORMAL HIGH (ref 11.5–15.5)
WBC: 11.7 10*3/uL — ABNORMAL HIGH (ref 4.0–10.5)
nRBC: 0 % (ref 0.0–0.2)

## 2021-05-18 LAB — COMPREHENSIVE METABOLIC PANEL
ALT: 16 U/L (ref 0–44)
AST: 37 U/L (ref 15–41)
Albumin: 3.4 g/dL — ABNORMAL LOW (ref 3.5–5.0)
Alkaline Phosphatase: 94 U/L (ref 38–126)
Anion gap: 9 (ref 5–15)
BUN: 26 mg/dL — ABNORMAL HIGH (ref 6–20)
CO2: 26 mmol/L (ref 22–32)
Calcium: 9 mg/dL (ref 8.9–10.3)
Chloride: 95 mmol/L — ABNORMAL LOW (ref 98–111)
Creatinine, Ser: 0.95 mg/dL (ref 0.44–1.00)
GFR, Estimated: >60 mL/min (ref >60)
Glucose, Bld: 93 mg/dL (ref 70–99)
Potassium: 4 mmol/L (ref 3.5–5.1)
Sodium: 130 mmol/L — ABNORMAL LOW (ref 135–145)
Total Bilirubin: 1.7 mg/dL — ABNORMAL HIGH (ref 0.3–1.2)
Total Protein: 6.6 g/dL (ref 6.5–8.1)

## 2021-05-18 LAB — RESP PANEL BY RT-PCR (FLU A&B, COVID) ARPGX2
Influenza A by PCR: NEGATIVE
Influenza B by PCR: NEGATIVE
SARS Coronavirus 2 by RT PCR: NEGATIVE

## 2021-05-18 LAB — BRAIN NATRIURETIC PEPTIDE: B Natriuretic Peptide: 4147.9 pg/mL — ABNORMAL HIGH (ref 0.0–100.0)

## 2021-05-18 LAB — LACTIC ACID, PLASMA: Lactic Acid, Venous: 1.8 mmol/L (ref 0.5–1.9)

## 2021-05-18 LAB — TROPONIN I (HIGH SENSITIVITY): Troponin I (High Sensitivity): 20 ng/L — ABNORMAL HIGH (ref <18)

## 2021-05-18 MED ORDER — FUROSEMIDE 10 MG/ML IJ SOLN
60.0000 mg | Freq: Once | INTRAMUSCULAR | Status: AC
  Administered 2021-05-18: 60 mg via INTRAVENOUS
  Filled 2021-05-18: qty 8

## 2021-05-18 MED ORDER — DIPHENHYDRAMINE HCL 50 MG/ML IJ SOLN
25.0000 mg | Freq: Once | INTRAMUSCULAR | Status: AC
  Administered 2021-05-18: 25 mg via INTRAVENOUS
  Filled 2021-05-18: qty 1

## 2021-05-18 NOTE — ED Provider Notes (Signed)
[facility] Emergency Department Provider Note   ____________________________________________   Event Date/Time   First MD Initiated Contact with Patient 05/18/21 2145     (approximate)  I have reviewed the triage vital signs and the nursing notes.   HISTORY  Chief Complaint Facial Swelling and Shortness of Breath    HPI Anita [name] is a 60 y.o. female with below stated past medical history who presents for worsening swelling including facial and left upper extremity swelling that began to arrival but has been worsening in the last few hours.  Patient denies any history of allergic reactions and states that this is happened once before when she has been severely volume overloaded secondary to her CHF.  Patient states that she has been in her normal state of health until approximately 1 day ago when she noticed increased swelling in her lower extremities that spread up her abdomen to her left upper extremity and face.  Patient also endorses worsening shortness of breath over this time.  Patient currently denies any tinnitus, difficulty speaking, facial droop, sore throat, chest pain, abdominal pain, nausea/vomiting/diarrhea, dysuria, or weakness/numbness/paresthesias in any extremity         Past Medical History:  Diagnosis Date   Abnormality of gait 07/26/2013   Acute respiratory distress 03/20/2020   Acute respiratory failure with hypoxia ([place]) 03/20/2020   Allergy    Arthritis    Atypical pneumonia 03/22/2020   Chickenpox    Elevated troponin 03/22/2020   GERD (gastroesophageal reflux disease)    Headache(784.0)    Migraine   Hearing loss    Right ear secondary to infection   History of shingles    Migraines    Multiple sclerosis (HCC)    Obese    Optic neuritis    Prolonged QT interval 03/20/2020    Patient Active Problem List   Diagnosis Date Noted   Esophageal dysphagia 01/08/2021   Ischemic cardiomyopathy 10/08/2020   Chronic  combined systolic and diastolic heart failure ([place]) 10/08/2020   Coronary artery disease involving native coronary artery of native heart without angina pectoris 10/08/2020   IDA (iron deficiency anemia) 10/05/2020   Vitamin B12 deficiency 08/23/2020   COPD (chronic obstructive pulmonary disease) ([place]) 06/22/2020   Tobacco abuse 62/13/0865   Acute systolic CHF (congestive heart failure) ([place]) 03/22/2020   Pulmonary hypertension ([place]) 03/22/2020   Retroperitoneal mass 03/22/2020   Insomnia 03/22/2020   NSTEMI (non-ST elevated myocardial infarction) ([place]) 03/20/2020   SIRS (systemic inflammatory response syndrome) ([place]) 03/20/2020   Normocytic anemia 03/20/2020   AKI (acute kidney injury) ([place]) 03/20/2020   Gastroesophageal reflux disease 02/15/2018   Chronic migraine without aura with status migrainosus, not intractable 02/15/2018   Osteoarthritis 02/15/2018   Essential hypertension 02/15/2018   Disturbance of skin sensation 03/26/2012   Multiple sclerosis ([place]) 03/26/2012    Past Surgical History:  Procedure Laterality Date   COLONOSCOPY WITH PROPOFOL N/A 08/03/2020   Procedure: COLONOSCOPY WITH PROPOFOL;  Surgeon: [name], MD;  Location: [facility] ENDOSCOPY;  Service: Gastroenterology;  Laterality: N/A;   Ganglioneuroma     Resection   LEFT HEART CATH AND CORONARY ANGIOGRAPHY N/A 03/23/2020   Procedure: LEFT HEART CATH AND CORONARY ANGIOGRAPHY;  Surgeon: Leonie Man, MD;  Location: [facility];  Service: Cardiovascular;  Laterality: N/A;   PILONIDAL CYST EXCISION     PILONIDAL CYST EXCISION  1983   TUMOR REMOVAL  2007/2008    Prior to Admission medications   Medication Sig Start  Date End Date Taking? Authorizing Provider  acetaminophen (TYLENOL) 325 MG tablet Take 2 tablets (650 mg total) by mouth every 6 (six) hours as needed for mild pain (or Fever >/= 101). 03/28/20   Barton Dubois, MD  albuterol (VENTOLIN HFA) 108 (90 Base) MCG/ACT inhaler Inhale 1-2 puffs into the  lungs every 6 (six) hours as needed for wheezing or shortness of breath. Use sparingly! 12/31/20   [name], NP  aspirin EC 81 MG EC tablet Take 1 tablet (81 mg total) by mouth daily. 03/29/20   Barton Dubois, MD  calcium carbonate (TUMS EX) 750 MG chewable tablet Chew 1 tablet by mouth daily as needed for heartburn.    [provider]  carvedilol (COREG) 3.125 MG tablet TAKE 1 TABLET (3.125 MG TOTAL) BY MOUTH 2 (TWO) TIMES DAILY WITH A MEAL. 01/01/21   Buford Dresser, MD  cetirizine (ZYRTEC) 10 MG tablet Take 10 mg by mouth daily.    [provider]  Doxylamine Succinate, Sleep, (SLEEP AID PO) Take 1 tablet by mouth at bedtime as needed (sleep).     [provider]  feeding supplement, ENSURE ENLIVE, (ENSURE ENLIVE) LIQD Take 237 mLs by mouth 2 (two) times daily between meals. 03/28/20   Barton Dubois, MD  ferrous sulfate 325 (65 FE) MG EC tablet TAKE 1 TABLET BY MOUTH 2 TIMES DAILY WITH A MEAL. 05/14/21   Earlie Server, MD  Fluticasone-Salmeterol (ADVAIR DISKUS) 250-50 MCG/DOSE AEPB Inhale 1 puff into the lungs 2 (two) times daily. 01/08/21   [name], NP  guaiFENesin (MUCINEX) 600 MG 12 hr tablet Take 600 mg by mouth 2 (two) times daily as needed for cough.    [provider]  losartan (COZAAR) 25 MG tablet TAKE 0.5 TABLETS (12.5 MG TOTAL) BY MOUTH DAILY. FOR BLOOD PRESSURE. 04/10/21   Lesleigh Noe, MD  mirtazapine (REMERON) 15 MG tablet Take 1 tablet (15 mg total) by mouth at bedtime. 01/08/21   [name], NP  Multiple Vitamin (MULTIVITAMIN WITH MINERALS) TABS tablet Take 1 tablet by mouth daily. 03/29/20   Barton Dubois, MD  nitroGLYCERIN (NITROSTAT) 0.4 MG SL tablet as needed. 04/23/20   [provider]  omeprazole (PRILOSEC) 40 MG capsule Take 1 capsule (40 mg total) by mouth daily. 03/04/21   [name], MD  potassium chloride SA (KLOR-CON) 20 MEQ tablet Take 1 tablet (20 mEq total) by mouth 2 (two) times daily. For low  potassium. 02/21/21   [name], NP  Probiotic Product (PROBIOTIC ADVANCED PO) Take by mouth.    [provider]  rosuvastatin (CRESTOR) 20 MG tablet Take 1 tablet (20 mg total) by mouth daily. For cholesterol. 05/18/20   [name], NP  SUMAtriptan (IMITREX) 25 MG tablet Take 1 tablet by mouth at migraine onset, may repeat in 2 hours if headache persists or recurs. Do not exceed 2 tablets in 24 hours. 02/15/18   [name], NP  torsemide (DEMADEX) 20 MG tablet Take 1 tablet (20 mg total) by mouth daily as needed. For leg or abdomen swelling 08/17/20   Buford Dresser, MD  vitamin B-12 (CYANOCOBALAMIN) 1000 MCG tablet Take 1,000 mcg by mouth 2 (two) times a week.    [provider]    Allergies Acthar hp [corticotropin], Codeine, and Prednisone  Family History  Problem Relation Age of Onset   Skin cancer Mother    Lung cancer Father    Uterine cancer Sister    Heart disease Brother  Bipolar disorder Sister    Throat cancer Paternal Grandmother     Social History Social History   Tobacco Use   Smoking status: Former    Packs/day: 1.00    Pack years: 0.00    Types: Cigarettes   Smokeless tobacco: Never  Vaping Use   Vaping Use: Never used  Substance Use Topics   Alcohol use: Yes    Comment: Occasional    Review of Systems Constitutional: No fever/chills Eyes: No visual changes. ENT: No sore throat. Cardiovascular: Denies chest pain. Respiratory: Endorses shortness of breath. Gastrointestinal: No abdominal pain.  No nausea, no vomiting.  No diarrhea. Genitourinary: Negative for dysuria. Musculoskeletal: Negative for acute arthralgias Skin: Negative for rash.  Endorses bilateral lower extremity edema, abdominal edema, left upper extremity edema, and facial edema Neurological: Negative for headaches, weakness/numbness/paresthesias in any extremity Psychiatric: Negative for suicidal ideation/homicidal  ideation   ____________________________________________   PHYSICAL EXAM:  VITAL SIGNS: ED Triage Vitals  Enc Vitals Group     BP 05/18/21 2004 (!) 162/93     Pulse Rate 05/18/21 2004 90     Resp 05/18/21 2004 (!) 28     Temp 05/18/21 2004 98.1 F (36.7 C)     Temp Source 05/18/21 2004 Oral     SpO2 05/18/21 2004 (!) 89 %     Weight 05/18/21 2005 153 lb (69.4 kg)     Height --      Head Circumference --      Peak Flow --      Pain Score 05/18/21 2005 0     Pain Loc --      Pain Edu? --      Excl. in [place]? --    Constitutional: Alert and oriented. Well appearing and in no acute distress. Eyes: Conjunctivae are normal. PERRL.  Periorbital edema Head: Atraumatic. Nose: No congestion/rhinnorhea. Mouth/Throat: Mucous membranes are moist. Neck: No stridor Cardiovascular: Grossly normal heart sounds.  Good peripheral circulation. Respiratory: Normal respiratory effort.  No retractions. Gastrointestinal: Soft and nontender. No distention. Musculoskeletal: No obvious deformities Neurologic:  Normal speech and language. No gross focal neurologic deficits are appreciated. Skin:  Skin is warm and dry. No rash noted.  Bilateral lower extremity edema and left upper extremity edema Psychiatric: Mood and affect are normal. Speech and behavior are normal.  ____________________________________________   LABS (all labs ordered are listed, but only abnormal results are displayed)  Labs Reviewed  COMPREHENSIVE METABOLIC PANEL - Abnormal; Notable for the following components:      Result Value   Sodium 130 (*)    Chloride 95 (*)    BUN 26 (*)    Albumin 3.4 (*)    Total Bilirubin 1.7 (*)    All other components within normal limits  BRAIN NATRIURETIC PEPTIDE - Abnormal; Notable for the following components:   B Natriuretic Peptide 4,147.9 (*)    All other components within normal limits  CBC WITH DIFFERENTIAL/PLATELET - Abnormal; Notable for the following components:   WBC 11.7 (*)     RBC 3.74 (*)    Hemoglobin 10.6 (*)    HCT 33.1 (*)    RDW 16.3 (*)    Neutro Abs 10.1 (*)    Lymphs Abs 0.5 (*)    Monocytes Absolute 1.1 (*)    All other components within normal limits  TROPONIN I (HIGH SENSITIVITY) - Abnormal; Notable for the following components:   Troponin I (High Sensitivity) 20 (*)    All other components within normal  limits  RESP PANEL BY RT-PCR (FLU A&B, COVID) ARPGX2  LACTIC ACID, PLASMA  URINALYSIS, COMPLETE (UACMP) WITH MICROSCOPIC  TROPONIN I (HIGH SENSITIVITY)   ____________________________________________  EKG  ED ECG REPORT I, Naaman Plummer, the attending physician, personally viewed and interpreted this ECG.  Date: 05/18/2021 EKG Time: 2207 Rate: 104 Rhythm: Tachycardic sinus rhythm QRS Axis: normal Intervals: normal ST/T Wave abnormalities: normal Narrative Interpretation: no evidence of acute ischemia  ____________________________________________  RADIOLOGY  ED MD interpretation: Single view portable x-ray of the chest shows cardiomegaly with effusions and likely edema  Official radiology report(s): DG Chest Port 1 View  Result Date: 05/18/2021 CLINICAL DATA:  Shortness of breath EXAM: PORTABLE CHEST 1 VIEW COMPARISON:  01/08/2021 FINDINGS: Cardiomegaly. Bilateral airspace disease, right greater than left. Probable layering effusions. No acute bony abnormality. IMPRESSION: Bilateral airspace disease with cardiomegaly and probable layering effusions. Findings could reflect edema or infection. Electronically Signed   By: Rolm Baptise M.D.   On: 05/18/2021 22:03    ____________________________________________   PROCEDURES  Procedure(s) performed (including Critical Care):  .1-3 Lead EKG Interpretation  Date/Time: 05/18/2021 11:43 PM Performed by: Naaman Plummer, MD Authorized by: Naaman Plummer, MD     Interpretation: normal     ECG rate:  85   ECG rate assessment: normal     Rhythm: sinus rhythm     Ectopy: none      Conduction: normal     ____________________________________________   INITIAL IMPRESSION / ASSESSMENT AND PLAN / ED COURSE  As part of my medical decision making, I reviewed the following data within the [facility] notes reviewed and incorporated, Labs reviewed, EKG interpreted, Old chart reviewed, Radiograph reviewed and Notes from prior ED visits reviewed and incorporated        Patient is a 60 year old female with the above-stated past medical history who presents for worsening edema and shortness of breath  Differential diagnosis includes but is not limited to: CHF exacerbation, acute renal failure, allergic reaction  Laboratory evaluation significant for slightly increased troponin to 20, leukocytosis to 11.7, and anemia with a hemoglobin of 10.6.  Patient pending repeat troponin and BNP/lactate at this time  Radiologic evaluation only significant for edema and cardiomegaly on chest x-ray.  Left upper extremity Doppler ultrasound pending  Care of this patient will be signed out to the oncoming physician at the end of my shift.  All pertinent patient information conveyed and all questions answered.  All further care and disposition decisions will be made by the oncoming physician.      ____________________________________________   FINAL CLINICAL IMPRESSION(S) / ED DIAGNOSES  Final diagnoses:  Facial swelling     ED Discharge Orders     None        Note:  This document was prepared using Dragon voice recognition software and may include unintentional dictation errors.    Naaman Plummer, MD 05/18/21 [phone]

## 2021-05-18 NOTE — ED Triage Notes (Signed)
Pt presents to ER via pov w/daughter c/o facial swelling and SOB that started yesterday.  Pt's face is noticably swollen, and has become worse in last few hours.  Pt has not started any new meds, and has not had had any new food.  Pt has hx of MS.  Tongue does not appear swollen at this moment.

## 2021-05-18 NOTE — ED Notes (Signed)
Pt presents with facial edema, her daughter is at bedside and reports she was diagnosed with heart failure aprox one year ago and has experienced bilateral leg and bilateral arm swelling since which they usually manage with diuretics. The facial swelling is new and unresolved by PO meds at home.

## 2021-05-19 DIAGNOSIS — I2510 Atherosclerotic heart disease of native coronary artery without angina pectoris: Secondary | ICD-10-CM | POA: Diagnosis not present

## 2021-05-19 DIAGNOSIS — I5043 Acute on chronic combined systolic (congestive) and diastolic (congestive) heart failure: Secondary | ICD-10-CM

## 2021-05-19 DIAGNOSIS — Z888 Allergy status to other drugs, medicaments and biological substances status: Secondary | ICD-10-CM | POA: Diagnosis not present

## 2021-05-19 DIAGNOSIS — Z885 Allergy status to narcotic agent status: Secondary | ICD-10-CM | POA: Diagnosis not present

## 2021-05-19 DIAGNOSIS — I248 Other forms of acute ischemic heart disease: Secondary | ICD-10-CM | POA: Diagnosis present

## 2021-05-19 DIAGNOSIS — R0902 Hypoxemia: Secondary | ICD-10-CM

## 2021-05-19 DIAGNOSIS — G35 Multiple sclerosis: Secondary | ICD-10-CM | POA: Diagnosis not present

## 2021-05-19 DIAGNOSIS — J9621 Acute and chronic respiratory failure with hypoxia: Secondary | ICD-10-CM | POA: Diagnosis present

## 2021-05-19 DIAGNOSIS — Z7982 Long term (current) use of aspirin: Secondary | ICD-10-CM | POA: Diagnosis not present

## 2021-05-19 DIAGNOSIS — I110 Hypertensive heart disease with heart failure: Secondary | ICD-10-CM | POA: Diagnosis present

## 2021-05-19 DIAGNOSIS — J449 Chronic obstructive pulmonary disease, unspecified: Secondary | ICD-10-CM | POA: Diagnosis present

## 2021-05-19 DIAGNOSIS — N179 Acute kidney failure, unspecified: Secondary | ICD-10-CM | POA: Diagnosis present

## 2021-05-19 DIAGNOSIS — Z9181 History of falling: Secondary | ICD-10-CM | POA: Diagnosis not present

## 2021-05-19 DIAGNOSIS — J439 Emphysema, unspecified: Secondary | ICD-10-CM | POA: Diagnosis not present

## 2021-05-19 DIAGNOSIS — N390 Urinary tract infection, site not specified: Secondary | ICD-10-CM

## 2021-05-19 DIAGNOSIS — E785 Hyperlipidemia, unspecified: Secondary | ICD-10-CM | POA: Diagnosis present

## 2021-05-19 DIAGNOSIS — K219 Gastro-esophageal reflux disease without esophagitis: Secondary | ICD-10-CM | POA: Diagnosis present

## 2021-05-19 DIAGNOSIS — J432 Centrilobular emphysema: Secondary | ICD-10-CM | POA: Diagnosis not present

## 2021-05-19 DIAGNOSIS — I051 Rheumatic mitral insufficiency: Secondary | ICD-10-CM | POA: Diagnosis not present

## 2021-05-19 DIAGNOSIS — D649 Anemia, unspecified: Secondary | ICD-10-CM | POA: Diagnosis present

## 2021-05-19 DIAGNOSIS — I340 Nonrheumatic mitral (valve) insufficiency: Secondary | ICD-10-CM | POA: Diagnosis not present

## 2021-05-19 DIAGNOSIS — R5381 Other malaise: Secondary | ICD-10-CM | POA: Diagnosis present

## 2021-05-19 DIAGNOSIS — I5023 Acute on chronic systolic (congestive) heart failure: Secondary | ICD-10-CM | POA: Diagnosis not present

## 2021-05-19 DIAGNOSIS — I255 Ischemic cardiomyopathy: Secondary | ICD-10-CM | POA: Diagnosis not present

## 2021-05-19 DIAGNOSIS — I2721 Secondary pulmonary arterial hypertension: Secondary | ICD-10-CM | POA: Diagnosis present

## 2021-05-19 DIAGNOSIS — Z79899 Other long term (current) drug therapy: Secondary | ICD-10-CM | POA: Diagnosis not present

## 2021-05-19 DIAGNOSIS — I272 Pulmonary hypertension, unspecified: Secondary | ICD-10-CM | POA: Diagnosis not present

## 2021-05-19 DIAGNOSIS — I10 Essential (primary) hypertension: Secondary | ICD-10-CM | POA: Diagnosis not present

## 2021-05-19 DIAGNOSIS — R601 Generalized edema: Secondary | ICD-10-CM

## 2021-05-19 DIAGNOSIS — Z87891 Personal history of nicotine dependence: Secondary | ICD-10-CM | POA: Diagnosis not present

## 2021-05-19 DIAGNOSIS — Z20822 Contact with and (suspected) exposure to covid-19: Secondary | ICD-10-CM | POA: Diagnosis present

## 2021-05-19 DIAGNOSIS — I081 Rheumatic disorders of both mitral and tricuspid valves: Secondary | ICD-10-CM | POA: Diagnosis present

## 2021-05-19 DIAGNOSIS — I509 Heart failure, unspecified: Secondary | ICD-10-CM

## 2021-05-19 DIAGNOSIS — I2582 Chronic total occlusion of coronary artery: Secondary | ICD-10-CM | POA: Diagnosis present

## 2021-05-19 LAB — TROPONIN I (HIGH SENSITIVITY): Troponin I (High Sensitivity): 18 ng/L — ABNORMAL HIGH (ref <18)

## 2021-05-19 LAB — URINALYSIS, COMPLETE (UACMP) WITH MICROSCOPIC
Bilirubin Urine: NEGATIVE
Glucose, UA: NEGATIVE mg/dL
Ketones, ur: NEGATIVE mg/dL
Nitrite: NEGATIVE
Protein, ur: 100 mg/dL — AB
Specific Gravity, Urine: 1.016 (ref 1.005–1.030)
pH: 5 (ref 5.0–8.0)

## 2021-05-19 LAB — GLUCOSE, CAPILLARY: Glucose-Capillary: 100 mg/dL — ABNORMAL HIGH (ref 70–99)

## 2021-05-19 MED ORDER — SODIUM CHLORIDE 0.9 % IV SOLN: 250.0000 mL | INTRAVENOUS | Status: DC | PRN

## 2021-05-19 MED ORDER — SODIUM CHLORIDE 0.9% FLUSH
3.0000 mL | INTRAVENOUS | Status: DC | PRN
  Administered 2021-05-21: 3 mL via INTRAVENOUS

## 2021-05-19 MED ORDER — LOSARTAN POTASSIUM 25 MG PO TABS
12.5000 mg | ORAL_TABLET | Freq: Every day | ORAL | Status: DC
  Administered 2021-05-19 – 2021-05-24 (×6): 12.5 mg via ORAL
  Filled 2021-05-19: qty 1
  Filled 2021-05-19: qty 0.5
  Filled 2021-05-19 (×4): qty 1

## 2021-05-19 MED ORDER — ACETAMINOPHEN 325 MG PO TABS
650.0000 mg | ORAL_TABLET | Freq: Four times a day (QID) | ORAL | Status: DC | PRN
  Administered 2021-05-19 – 2021-05-28 (×11): 650 mg via ORAL
  Filled 2021-05-19 (×12): qty 2

## 2021-05-19 MED ORDER — CARVEDILOL 3.125 MG PO TABS
3.1250 mg | ORAL_TABLET | Freq: Two times a day (BID) | ORAL | Status: DC
  Administered 2021-05-19 – 2021-05-28 (×19): 3.125 mg via ORAL
  Filled 2021-05-19 (×19): qty 1

## 2021-05-19 MED ORDER — ONDANSETRON HCL 4 MG PO TABS: 4.0000 mg | ORAL_TABLET | Freq: Four times a day (QID) | ORAL | Status: DC | PRN

## 2021-05-19 MED ORDER — FUROSEMIDE 10 MG/ML IJ SOLN
60.0000 mg | Freq: Two times a day (BID) | INTRAMUSCULAR | Status: DC
  Administered 2021-05-19 – 2021-05-21 (×5): 60 mg via INTRAVENOUS
  Filled 2021-05-19 (×2): qty 6
  Filled 2021-05-19: qty 8
  Filled 2021-05-19 (×2): qty 6

## 2021-05-19 MED ORDER — NITROGLYCERIN 0.4 MG SL SUBL: 0.4000 mg | SUBLINGUAL_TABLET | SUBLINGUAL | Status: DC | PRN

## 2021-05-19 MED ORDER — ONDANSETRON HCL 4 MG/2ML IJ SOLN
4.0000 mg | Freq: Four times a day (QID) | INTRAMUSCULAR | Status: DC | PRN
  Filled 2021-05-19: qty 2

## 2021-05-19 MED ORDER — ACETAMINOPHEN 500 MG PO TABS
1000.0000 mg | ORAL_TABLET | Freq: Once | ORAL | Status: AC
  Administered 2021-05-19: 1000 mg via ORAL
  Filled 2021-05-19: qty 2

## 2021-05-19 MED ORDER — ONDANSETRON HCL 4 MG/2ML IJ SOLN: 4.0000 mg | Freq: Four times a day (QID) | INTRAMUSCULAR | Status: DC | PRN

## 2021-05-19 MED ORDER — ALBUTEROL SULFATE HFA 108 (90 BASE) MCG/ACT IN AERS
1.0000 | INHALATION_SPRAY | Freq: Four times a day (QID) | RESPIRATORY_TRACT | Status: DC | PRN
  Filled 2021-05-19: qty 6.7

## 2021-05-19 MED ORDER — ACETAMINOPHEN 650 MG RE SUPP: 650.0000 mg | Freq: Four times a day (QID) | RECTAL | Status: DC | PRN

## 2021-05-19 MED ORDER — SODIUM CHLORIDE 0.9% FLUSH
3.0000 mL | Freq: Two times a day (BID) | INTRAVENOUS | Status: DC
  Administered 2021-05-19 – 2021-05-28 (×18): 3 mL via INTRAVENOUS

## 2021-05-19 MED ORDER — SENNOSIDES-DOCUSATE SODIUM 8.6-50 MG PO TABS: 1.0000 | ORAL_TABLET | Freq: Every evening | ORAL | Status: DC | PRN

## 2021-05-19 MED ORDER — ACETAMINOPHEN 325 MG PO TABS: 650.0000 mg | ORAL_TABLET | ORAL | Status: DC | PRN

## 2021-05-19 MED ORDER — HYDROCODONE-ACETAMINOPHEN 5-325 MG PO TABS
1.0000 | ORAL_TABLET | ORAL | Status: DC | PRN
  Administered 2021-05-20: 1 via ORAL
  Administered 2021-05-20: 2 via ORAL
  Administered 2021-05-21 – 2021-05-22 (×4): 1 via ORAL
  Administered 2021-05-23 (×2): 2 via ORAL
  Administered 2021-05-24: 1 via ORAL
  Administered 2021-05-24 – 2021-05-25 (×5): 2 via ORAL
  Administered 2021-05-26: 1 via ORAL
  Administered 2021-05-26: 2 via ORAL
  Administered 2021-05-26 – 2021-05-27 (×2): 1 via ORAL
  Administered 2021-05-27 (×2): 2 via ORAL
  Administered 2021-05-28: 1 via ORAL
  Filled 2021-05-19: qty 1
  Filled 2021-05-19: qty 2
  Filled 2021-05-19 (×6): qty 1
  Filled 2021-05-19 (×4): qty 2
  Filled 2021-05-19 (×2): qty 1
  Filled 2021-05-19 (×7): qty 2
  Filled 2021-05-19: qty 1

## 2021-05-19 NOTE — H&P (Signed)
History and Physical    Anita [name] YQM:[id number] DOB: [birth year] DOA: 05/18/2021  PCP: [name], NP   Patient coming from: Home  I have personally briefly reviewed patient's old medical records in [place]  Chief Complaint: Generalized swelling, shortness of breath  HPI: Anita [name] is a 60 y.o. female with medical history significant for Multiple sclerosis, COPD, CAD, chronic combined CHF, last EF 40 to 45% 02/2021, ischemic cardiomyopathy, severe mitral regurgitation with limited surgical options, severe pulmonary hypertension, previously on home O2, HTN, ex smoker, who presents to the ED with a 3 week complaint of generalized swelling, starting in the legs and extending to the abdomen then the arms and the face.  Swelling is worse on the left arm than the right.  She endorses shortness of breath with exertion and orthopnea.  She denies chest pain, cough, fever or chills.  Denies nausea, vomiting, diarrhea or dysuria. ED course: On arrival, tachypneic at 28 with O2 sat 89% on room air.  Afebrile, BP 162/93.  Pulse 90.  Blood work significant for BNP of 4147, troponin 20-18.  Sodium 130.  WBC 11,700, hemoglobin 10.6.  Urinalysis with large leukocyte esterase and many bacteria EKG, personally reviewed and interpreted: Normal sinus rhythm at 104 with nonspecific ST-T wave changes Imaging: Chest x-ray: Bilateral airspace disease with cardiomegaly and probable layering effusions.  Findings could reflect edema or infection Left upper extremity venous Doppler with no evidence of DVT  Patient treated with IV Lasix.  Hospitalist consulted for admission.  Review of Systems: As per HPI otherwise all other systems on review of systems negative.    Past Medical History:  Diagnosis Date   Abnormality of gait 07/26/2013   Acute respiratory distress 03/20/2020   Acute respiratory failure with hypoxia (HCC) 03/20/2020   Allergy    Arthritis    Atypical pneumonia  03/22/2020   Chickenpox    Elevated troponin 03/22/2020   GERD (gastroesophageal reflux disease)    Headache(784.0)    Migraine   Hearing loss    Right ear secondary to infection   History of shingles    Migraines    Multiple sclerosis (HCC)    Obese    Optic neuritis    Prolonged QT interval 03/20/2020    Past Surgical History:  Procedure Laterality Date   COLONOSCOPY WITH PROPOFOL N/A 08/03/2020   Procedure: COLONOSCOPY WITH PROPOFOL;  Surgeon: [name], MD;  Location: [facility] ENDOSCOPY;  Service: Gastroenterology;  Laterality: N/A;   Ganglioneuroma     Resection   LEFT HEART CATH AND CORONARY ANGIOGRAPHY N/A 03/23/2020   Procedure: LEFT HEART CATH AND CORONARY ANGIOGRAPHY;  Surgeon: Leonie Man, MD;  Location:  [facility];  Service: Cardiovascular;  Laterality: N/A;   PILONIDAL CYST EXCISION     PILONIDAL CYST EXCISION  1983   TUMOR REMOVAL  2007/2008     reports that she has quit smoking. Her smoking use included cigarettes. She smoked an average of 1.00 packs per day. She has never used smokeless tobacco. She reports current alcohol use. No history on file for drug use.  Allergies  Allergen Reactions   Acthar Hp [Corticotropin] Other (See Comments)    Throat swelling and coughing up blood   Codeine    Prednisone     Family History  Problem Relation Age of Onset   Skin cancer Mother    Lung cancer Father    Uterine cancer Sister    Heart disease Brother  Bipolar disorder Sister    Throat cancer Paternal Grandmother       Prior to Admission medications   Medication Sig Start Date End Date Taking? Authorizing Provider  acetaminophen (TYLENOL) 325 MG tablet Take 2 tablets (650 mg total) by mouth every 6 (six) hours as needed for mild pain (or Fever >/= 101). 03/28/20   Barton Dubois, MD  albuterol (VENTOLIN HFA) 108 (90 Base) MCG/ACT inhaler Inhale 1-2 puffs into the lungs every 6 (six) hours as needed for wheezing or shortness of breath. Use sparingly!  12/31/20   [name], NP  aspirin EC 81 MG EC tablet Take 1 tablet (81 mg total) by mouth daily. 03/29/20   Barton Dubois, MD  calcium carbonate (TUMS EX) 750 MG chewable tablet Chew 1 tablet by mouth daily as needed for heartburn.    [provider]  carvedilol (COREG) 3.125 MG tablet TAKE 1 TABLET (3.125 MG TOTAL) BY MOUTH 2 (TWO) TIMES DAILY WITH A MEAL. 01/01/21   Buford Dresser, MD  cetirizine (ZYRTEC) 10 MG tablet Take 10 mg by mouth daily.    [provider]  Doxylamine Succinate, Sleep, (SLEEP AID PO) Take 1 tablet by mouth at bedtime as needed (sleep).     [provider]  feeding supplement, ENSURE ENLIVE, (ENSURE ENLIVE) LIQD Take 237 mLs by mouth 2 (two) times daily between meals. 03/28/20   Barton Dubois, MD  ferrous sulfate 325 (65 FE) MG EC tablet TAKE 1 TABLET BY MOUTH 2 TIMES DAILY WITH A MEAL. 05/14/21   Earlie Server, MD  Fluticasone-Salmeterol (ADVAIR DISKUS) 250-50 MCG/DOSE AEPB Inhale 1 puff into the lungs 2 (two) times daily. 01/08/21   [name], NP  guaiFENesin (MUCINEX) 600 MG 12 hr tablet Take 600 mg by mouth 2 (two) times daily as needed for cough.    [provider]  losartan (COZAAR) 25 MG tablet TAKE 0.5 TABLETS (12.5 MG TOTAL) BY MOUTH DAILY. FOR BLOOD PRESSURE. 04/10/21   Lesleigh Noe, MD  mirtazapine (REMERON) 15 MG tablet Take 1 tablet (15 mg total) by mouth at bedtime. 01/08/21   [name], NP  Multiple Vitamin (MULTIVITAMIN WITH MINERALS) TABS tablet Take 1 tablet by mouth daily. 03/29/20   Barton Dubois, MD  nitroGLYCERIN (NITROSTAT) 0.4 MG SL tablet as needed. 04/23/20   [provider]  omeprazole (PRILOSEC) 40 MG capsule Take 1 capsule (40 mg total) by mouth daily. 03/04/21   [name], MD  potassium chloride SA (KLOR-CON) 20 MEQ tablet Take 1 tablet (20 mEq total) by mouth 2 (two) times daily. For low potassium. 02/21/21   [name], NP  Probiotic Product (PROBIOTIC ADVANCED PO) Take  by mouth.    [provider]  rosuvastatin (CRESTOR) 20 MG tablet Take 1 tablet (20 mg total) by mouth daily. For cholesterol. 05/18/20   [name], NP  SUMAtriptan (IMITREX) 25 MG tablet Take 1 tablet by mouth at migraine onset, may repeat in 2 hours if headache persists or recurs. Do not exceed 2 tablets in 24 hours. 02/15/18   [name], NP  torsemide (DEMADEX) 20 MG tablet Take 1 tablet (20 mg total) by mouth daily as needed. For leg or abdomen swelling 08/17/20   Buford Dresser, MD  vitamin B-12 (CYANOCOBALAMIN) 1000 MCG tablet Take 1,000 mcg by mouth 2 (two) times a week.    [provider]    Physical Exam: Vitals:   05/18/21 2200 05/18/21 2230 05/18/21 2330 05/19/21 0030  BP: (!) 159/96 (!) 148/104 (!) 157/100 (!) 157/97  Pulse: 76 87 84 86  Resp: 12 17 18 18   Temp:      TempSrc:      SpO2: 97% 97% 97% 97%  Weight:         Vitals:   05/18/21 2200 05/18/21 2230 05/18/21 2330 05/19/21 0030  BP: (!) 159/96 (!) 148/104 (!) 157/100 (!) 157/97  Pulse: 76 87 84 86  Resp: 12 17 18 18   Temp:      TempSrc:      SpO2: 97% 97% 97% 97%  Weight:          Constitutional: Chronically ill-appearing female, alert and oriented x 3 .  Mild respiratory distress HEENT:      Head: Normocephalic and atraumatic.         Eyes: PERLA, EOMI, Conjunctivae are normal. Sclera is non-icteric.  Periorbital/eyelid edema symmetrical     mouth/Throat: Mucous membranes are moist.       Neck: Supple with no signs of meningismus. Cardiovascular: Regular rate and rhythm. No murmurs, gallops, or rubs. 2+ symmetrical distal pulses are present . No JVD.  3+ LE edema, edema left arm Respiratory: Respiratory effort increased.Lungs diminished bilaterally with faint crackles at bases Gastrointestinal: Soft, non tender, and non distended with positive bowel sounds.  Genitourinary: No CVA tenderness. Musculoskeletal: Nontender with normal range of motion in all extremities.  No cyanosis, or erythema of extremities. Neurologic:  Face is symmetric. Moving all extremities. No gross focal neurologic deficits . Skin: Skin is warm, dry.  No rash or ulcers Psychiatric: Mood and affect are normal    Labs on Admission: I have personally reviewed following labs and imaging studies  CBC: Recent Labs  Lab 05/18/21 2205  WBC 11.7*  NEUTROABS 10.1*  HGB 10.6*  HCT 33.1*  MCV 88.5  PLT 389   Basic Metabolic Panel: Recent Labs  Lab 05/18/21 2205  NA 130*  K 4.0  CL 95*  CO2 26  GLUCOSE 93  BUN 26*  CREATININE 0.95  CALCIUM 9.0   GFR: Estimated Creatinine Clearance: 60.3 mL/min (by C-G formula based on SCr of 0.95 mg/dL). Liver Function Tests: Recent Labs  Lab 05/18/21 2205  AST 37  ALT 16  ALKPHOS 94  BILITOT 1.7*  PROT 6.6  ALBUMIN 3.4*   No results for input(s): LIPASE, AMYLASE in the last 168 hours. No results for input(s): AMMONIA in the last 168 hours. Coagulation Profile: No results for input(s): INR, PROTIME in the last 168 hours. Cardiac Enzymes: No results for input(s): CKTOTAL, CKMB, CKMBINDEX, TROPONINI in the last 168 hours. BNP (last 3 results) No results for input(s): PROBNP in the last 8760 hours. HbA1C: No results for input(s): HGBA1C in the last 72 hours. CBG: No results for input(s): GLUCAP in the last 168 hours. Lipid Profile: No results for input(s): CHOL, HDL, LDLCALC, TRIG, CHOLHDL, LDLDIRECT in the last 72 hours. Thyroid Function Tests: No results for input(s): TSH, T4TOTAL, FREET4, T3FREE, THYROIDAB in the last 72 hours. Anemia Panel: No results for input(s): VITAMINB12, FOLATE, FERRITIN, TIBC, IRON, RETICCTPCT in the last 72 hours. Urine analysis:    Component Value Date/Time   COLORURINE YELLOW (A) 05/18/2021 2205   APPEARANCEUR HAZY (A) 05/18/2021 2205   APPEARANCEUR Clear 07/02/2020 1620   LABSPEC 1.016 05/18/2021 2205   PHURINE 5.0 05/18/2021 2205   GLUCOSEU NEGATIVE 05/18/2021 2205   HGBUR MODERATE (A)  05/18/2021 2205   BILIRUBINUR NEGATIVE 05/18/2021 2205   BILIRUBINUR Negative  07/02/2020 [place] 05/18/2021 2205   PROTEINUR 100 (A) 05/18/2021 2205   NITRITE NEGATIVE 05/18/2021 2205   LEUKOCYTESUR LARGE (A) 05/18/2021 2205    Radiological Exams on Admission: US Venous Img Upper Uni Left  Result Date: 05/18/2021 CLINICAL DATA:  Edema EXAM: LEFT UPPER EXTREMITY VENOUS DOPPLER ULTRASOUND TECHNIQUE: Gray-scale sonography with graded compression, as well as color Doppler and duplex ultrasound were performed to evaluate the upper extremity deep venous system from the level of the subclavian vein and including the jugular, axillary, basilic, radial, ulnar and upper cephalic vein. Spectral Doppler was utilized to evaluate flow at rest and with distal augmentation maneuvers. COMPARISON:  None. FINDINGS: Contralateral Subclavian Vein: Respiratory phasicity is normal and symmetric with the symptomatic side. No evidence of thrombus. Normal compressibility. Internal Jugular Vein: No evidence of thrombus. Normal compressibility, respiratory phasicity and response to augmentation. Subclavian Vein: No evidence of thrombus. Normal compressibility, respiratory phasicity and response to augmentation. Axillary Vein: No evidence of thrombus. Normal compressibility, respiratory phasicity and response to augmentation. Cephalic Vein: No evidence of thrombus. Normal compressibility, respiratory phasicity and response to augmentation. Basilic Vein: No evidence of thrombus. Normal compressibility, respiratory phasicity and response to augmentation. Brachial Veins: No evidence of thrombus. Normal compressibility, respiratory phasicity and response to augmentation. Radial Veins: No evidence of thrombus. Normal compressibility, respiratory phasicity and response to augmentation. Ulnar Veins: No evidence of thrombus. Normal compressibility, respiratory phasicity and response to augmentation. Venous Reflux:  None  visualized. Other Findings:  None visualized. IMPRESSION: No evidence of DVT within the left upper extremity. Diffuse subcutaneous edema. Electronically Signed   By: Dorise Bullion III M.D   On: 05/18/2021 23:58   DG Chest Port 1 View  Result Date: 05/18/2021 CLINICAL DATA:  Shortness of breath EXAM: PORTABLE CHEST 1 VIEW COMPARISON:  01/08/2021 FINDINGS: Cardiomegaly. Bilateral airspace disease, right greater than left. Probable layering effusions. No acute bony abnormality. IMPRESSION: Bilateral airspace disease with cardiomegaly and probable layering effusions. Findings could reflect edema or infection. Electronically Signed   By: Rolm Baptise M.D.   On: 05/18/2021 22:03     Assessment/Plan 60 year old female with history of multiple sclerosis, COPD, CAD, chronic combined CHF, last EF 40 to 45% 02/2021, ischemic cardiomyopathy, severe mitral regurgitation with limited surgical options, severe pulmonary hypertension, previously on home O2, HTN, ex smoker, presenting with anasarca.      Anasarca   Acute on chronic combined systolic and diastolic CHF    Ischemic cardiomyopathy   Severe mitral regurgitation - Last echo on file from March 2022 with grade 2 diastolic dysfunction EF 40 to 45% with severe mitral regurg - ACS not suspected.  BNP 4147 with troponin 20/18, EKG nonacute and no chest pain - Suspect related to severe mitral regurg as well as severe pulmonary hypertension - IV Lasix - Continue home Coreg and losartan - Daily weights with intake and output monitoring and salt and fluid restriction - Cardiology consult -Patient had LUE venous Doppler for the differential swelling of the left arm.  Negative for DVT    Multiple sclerosis (HCC) - Appears stable    Essential hypertension - Continue Coreg and losartan  Hypoxia Pulmonary hypertension (HCC) COPD (chronic obstructive pulmonary disease) (HCC) - O2 sat 89% in the ER requiring 2 L O2 - Patient was previously on home O2, not  currently.  May need home O2 eval prior to discharge - DuoNebs as needed  CAD - Troponin 20/18 with nonacute EKG and no chest pain - Continue  aspirin, rosuvastatin and Coreg  Possible UTI (urinary tract infection) - Urinalysis with large LE and many bacteria - Await culture.  No antibiotics for now     DVT prophylaxis: SCDs, due to severe allergy to corticotropin code Status: full code  Family Communication:  none  Disposition Plan: Back to previous home environment Consults called: Cardiology Status:At the time of admission, it appears that the appropriate admission status for this patient is INPATIENT. This is judged to be reasonable and necessary in order to provide the required intensity of service to ensure the patient's safety given the presenting symptoms, physical exam findings, and initial radiographic and laboratory data in the context of their  Comorbid conditions.   Patient requires inpatient status due to high intensity of service, high risk for further deterioration and high frequency of surveillance required.   I certify that at the point of admission it is my clinical judgment that the patient will require inpatient hospital care spanning beyond [place] MD [facility]     05/19/2021, 1:32 AM

## 2021-05-19 NOTE — Progress Notes (Signed)
Same day rounding progress note  Patient seen and examined in the ED.  She is very sleepy barely able to open her eyes.  Reports some improvement in her breathing. Please see Dr. Josefine Class dictated history and physical for further details.  Anasarca/acute on chronic CHF Continue diuresis and await cardiology input  Time spent: 15 minutes

## 2021-05-19 NOTE — ED Notes (Signed)
Pt repositioned in bed at this time, given soda at this time

## 2021-05-19 NOTE — ED Notes (Signed)
Pt bed linen change, new brief and purwick placed

## 2021-05-19 NOTE — Plan of Care (Signed)
  Problem: Activity: Goal: Risk for activity intolerance will decrease Outcome: Not Progressing Note: Patient is lethargic arouse to voice, follow commands. Patient stated she has been very sleepy for about 3 days. Patient does not help with self care.

## 2021-05-19 NOTE — Consult Note (Addendum)
Cardiology Consultation:   Patient ID: LEZA APSEY MRN: [id number]; DOB: [birth year]  Admit date: 05/18/2021 Date of Consult: 05/19/2021  PCP:  [name], NP   [facility] Providers Cardiologist:  Buford Dresser, MD   {  Patient Profile:   Anita [name] is a 60 y.o. female with a hx of  multiple sclerosis, chronic diffuse moderate CAD by cath 03/2020 recommended medical management, HFrEF, ICM EF 40-45%, mod to severe MR,  tachycardia, COPD, prior tobacco, and chronic SOB use who is being seen 05/19/2021 for the evaluation of acute heart failure at the request of Dr. Manuella Ghazi.  History of Present Illness:   Anita [name] is followed by Dr. Harrell Gave for the above cardiac issues.  The patient underwent cardiac catheterization 03/23/2020 which showed LST RCA to distal RCA 100% stenosed, PDA and PL systems fills via collaterals, mid LAD 60% stenosis, mid LAD-2 50% stenosis, distal LAD 50% stenosis with 70% stenosed side branch of second diagonal.  First diagonal lesion 70% stenosed.  LST circumflex to proximal circumflex 45% stenosed, proximal mid circumflex lesion 65% stenosed.  Aggressive medical management was recommended.  She was not a candidate for CABG.  Echo 03/20/2020 showed LVEF 40 to 45%, global hypokinesis, mildly dilated LV, moderately elevated pulmonary artery systolic pressure, left atrial size mildly dilated, from rheumatic mitral valve with moderate to severe MR, mild mitral stenosis with mean gradient 7.3 mmHg.  Since been placed on current directed medical therapy.    Most recent echo 02/07/2021 showed LVEF 40 to 45%, global hypokinesis, grade 2 diastolic dysfunction, elevated LVEDP, severely reduced RV function, severely elevated pulmonary artery systolic pressure, moderately dilated left atrium, severely dilated right atrium, severe MR, mild to moderate MS with mean gradient valve 6.5 mmHg, moderate to severe TR mild AI.  Patient was last seen  02/13/2021 for shortness of breath.  MitraClip was discussed.  Likely needs TEE.  Patient presented to [facility] ED 05/18/2021 for facial swelling and shortness of breath. Patient says she started noticing swelling about 3 weeks ago, she thought she could handle it at home by herself, however over the last 24 hours it became worse. She reported facial swelling, both eyes and cheeks swollen. Also has lower leg edema orthopnea, abdominal fullness, and some swelling in her upper extremities. Reports shortness of breath as well.  She denies starting any new medications. She denies chest pain. No fever or chills. She reports chronic nausea and vomiting with low appetite. She eats low salt diet. She lives with her daughter. She was only taking torsemide as needed, she would generally take it 3 days on, 3 days off. She reports intermittent compliance with other cardiac medications. No smoking, drug, alcohol history.  In the ER blood pressure 162/93, pulse rate 90 bpm, respiratory rate 28, O2 89%, afebrile.  Labs showed sodium 130, potassium 4, creatinine 0.95, albumin 3.4, BNP 4147.  WBC 11.7, hemoglobin 10.6. HS troponin 20>18.  Chest x-ray showed bilateral airspace disease with cardiomegaly and probable layering effusions, possibly edema versus infection.  Respiratory panel negative.  EKG with ST 104bpm, low voltage, RAD, and no new ischemic changes. Left upper extremity venous Doppler negative for DVT.  UA with leukocytes and bacteria. Patient was admitted for further work-up.    Past Medical History:  Diagnosis Date   Abnormality of gait 07/26/2013   Acute respiratory distress 03/20/2020   Acute respiratory failure with hypoxia ([place]) 03/20/2020   Allergy    Arthritis    Atypical pneumonia 03/22/2020  Chickenpox    Elevated troponin 03/22/2020   GERD (gastroesophageal reflux disease)    Headache(784.0)    Migraine   Hearing loss    Right ear secondary to infection   History of shingles    Migraines     Multiple sclerosis (HCC)    Obese    Optic neuritis    Prolonged QT interval 03/20/2020    Past Surgical History:  Procedure Laterality Date   COLONOSCOPY WITH PROPOFOL N/A 08/03/2020   Procedure: COLONOSCOPY WITH PROPOFOL;  Surgeon: [name], MD;  Location: [facility] ENDOSCOPY;  Service: Gastroenterology;  Laterality: N/A;   Ganglioneuroma     Resection   LEFT HEART CATH AND CORONARY ANGIOGRAPHY N/A 03/23/2020   Procedure: LEFT HEART CATH AND CORONARY ANGIOGRAPHY;  Surgeon: Leonie Man, MD;  Location: [place] CV LAB;  Service: Cardiovascular;  Laterality: N/A;   PILONIDAL CYST EXCISION     PILONIDAL CYST EXCISION  1983   TUMOR REMOVAL  2007/2008     Home Medications:  Prior to Admission medications   Medication Sig Start Date End Date Taking? Authorizing Provider  acetaminophen (TYLENOL) 325 MG tablet Take 2 tablets (650 mg total) by mouth every 6 (six) hours as needed for mild pain (or Fever >/= 101). 03/28/20  Yes Barton Dubois, MD  albuterol (VENTOLIN HFA) 108 (90 Base) MCG/ACT inhaler Inhale 1-2 puffs into the lungs every 6 (six) hours as needed for wheezing or shortness of breath. Use sparingly! 12/31/20  Yes [name], NP  aspirin EC 81 MG EC tablet Take 1 tablet (81 mg total) by mouth daily. 03/29/20  Yes Barton Dubois, MD  calcium carbonate (TUMS EX) 750 MG chewable tablet Chew 1 tablet by mouth daily as needed for heartburn.   Yes [provider]  carvedilol (COREG) 3.125 MG tablet TAKE 1 TABLET (3.125 MG TOTAL) BY MOUTH 2 (TWO) TIMES DAILY WITH A MEAL. 01/01/21  Yes Buford Dresser, MD  cetirizine (ZYRTEC) 10 MG tablet Take 10 mg by mouth daily.   Yes [provider]  Doxylamine Succinate, Sleep, (SLEEP AID PO) Take 1 tablet by mouth at bedtime as needed (sleep).    Yes [provider]  feeding supplement, ENSURE ENLIVE, (ENSURE ENLIVE) LIQD Take 237 mLs by mouth 2 (two) times daily between meals. 03/28/20  Yes Barton Dubois, MD   ferrous sulfate 325 (65 FE) MG EC tablet TAKE 1 TABLET BY MOUTH 2 TIMES DAILY WITH A MEAL. Patient taking differently: Take 325 mg by mouth 2 (two) times daily. 05/14/21  Yes Earlie Server, MD  Fluticasone-Salmeterol (ADVAIR DISKUS) 250-50 MCG/DOSE AEPB Inhale 1 puff into the lungs 2 (two) times daily. 01/08/21  Yes [name], NP  guaiFENesin (MUCINEX) 600 MG 12 hr tablet Take 600 mg by mouth 2 (two) times daily as needed for cough.   Yes [provider]  losartan (COZAAR) 25 MG tablet TAKE 0.5 TABLETS (12.5 MG TOTAL) BY MOUTH DAILY. FOR BLOOD PRESSURE. 04/10/21  Yes Lesleigh Noe, MD  mirtazapine (REMERON) 15 MG tablet Take 1 tablet (15 mg total) by mouth at bedtime. 01/08/21  Yes [name], NP  Multiple Vitamin (MULTIVITAMIN WITH MINERALS) TABS tablet Take 1 tablet by mouth daily. 03/29/20  Yes Barton Dubois, MD  nitroGLYCERIN (NITROSTAT) 0.4 MG SL tablet Place 0.4 mg under the tongue every 5 (five) minutes as needed for chest pain. 04/23/20  Yes [provider]  omeprazole (PRILOSEC) 40 MG capsule Take 1 capsule (40 mg total) by mouth  daily. 03/04/21  Yes [name], MD  potassium chloride SA (KLOR-CON) 20 MEQ tablet Take 1 tablet (20 mEq total) by mouth 2 (two) times daily. For low potassium. 02/21/21  Yes [name], NP  Probiotic Product (PROBIOTIC ADVANCED) CAPS Take 1 capsule by mouth daily.   Yes [provider]  rosuvastatin (CRESTOR) 20 MG tablet Take 1 tablet (20 mg total) by mouth daily. For cholesterol. 05/18/20  Yes [name], NP  SUMAtriptan (IMITREX) 25 MG tablet Take 1 tablet by mouth at migraine onset, may repeat in 2 hours if headache persists or recurs. Do not exceed 2 tablets in 24 hours. 02/15/18  Yes [name], NP  torsemide (DEMADEX) 20 MG tablet Take 1 tablet (20 mg total) by mouth daily as needed. For leg or abdomen swelling 08/17/20  Yes Buford Dresser, MD  vitamin B-12 (CYANOCOBALAMIN) 1000 MCG tablet Take  1,000 mcg by mouth daily.   Yes [provider]    Inpatient Medications: Scheduled Meds:  carvedilol  3.125 mg Oral BID WC   furosemide  60 mg Intravenous BID   losartan  12.5 mg Oral Daily   sodium chloride flush  3 mL Intravenous Q12H   Continuous Infusions:  sodium chloride     PRN Meds: sodium chloride, acetaminophen **OR** acetaminophen, albuterol, HYDROcodone-acetaminophen, nitroGLYCERIN, ondansetron **OR** ondansetron (ZOFRAN) IV, senna-docusate, sodium chloride flush  Allergies:    Allergies  Allergen Reactions   Acthar Hp [Corticotropin] Other (See Comments)    Throat swelling and coughing up blood   Codeine    Prednisone     Social History:   Social History   Socioeconomic History   Marital status: Divorced    Spouse name: Not on file   Number of children: Not on file   Years of education: Not on file   Highest education level: Not on file  Occupational History   Not on file  Tobacco Use   Smoking status: Former    Packs/day: 1.00    Pack years: 0.00    Types: Cigarettes   Smokeless tobacco: Never  Vaping Use   Vaping Use: Never used  Substance and Sexual Activity   Alcohol use: Yes    Comment: Occasional   Drug use: Not on file   Sexual activity: Not on file  Other Topics Concern   Not on file  Social History Narrative   Not on file   Social Determinants of Health   Financial Resource Strain: Not on file  Food Insecurity: Not on file  Transportation Needs: Not on file  Physical Activity: Not on file  Stress: Not on file  Social Connections: Not on file  Intimate Partner Violence: Not on file    Family History:    Family History  Problem Relation Age of Onset   Skin cancer Mother    Lung cancer Father    Uterine cancer Sister    Heart disease Brother    Bipolar disorder Sister    Throat cancer Paternal Grandmother      ROS:  Please see the history of present illness.   All other ROS reviewed and negative.      Physical Exam/Data:   Vitals:   05/19/21 0930 05/19/21 1100 05/19/21 1130 05/19/21 1225  BP: 135/77 127/79 115/79 (!) 149/74  Pulse: 89 66 62 70  Resp: '16 11 12 14  ' Temp:    97.9 F (36.6 C)  TempSrc:    Oral  SpO2: 100% 100% 98% 100%  Weight:  No intake or output data in the 24 hours ending 05/19/21 1306 Last 3 Weights 05/18/2021 03/04/2021 02/13/2021  Weight (lbs) 153 lb 147 lb 12.8 oz 148 lb  Weight (kg) 69.4 kg 67.042 kg 67.132 kg     Body mass index is 27.1 kg/m.  General:  Well nourished, well developed, in no acute distress HEENT: normal Lymph: no adenopathy Neck: +JVD Endocrine:  No thryomegaly Vascular: No carotid bruits; FA pulses 2+ bilaterally without bruits  Cardiac:  normal S1, S2; RRR; + murmur Lungs:  diminished at base Abd: firm,, nontender, no hepatomegaly  Ext: 2+ lower leg edema Musculoskeletal:  No deformities, BUE and BLE strength normal and equal Skin: warm and dry  Neuro:  CNs 2-12 intact, no focal abnormalities noted Psych:  Normal affect   EKG:  The EKG was personally reviewed and demonstrates:  ST, 104bpm, low voltage, RAD, q waves aVL, V1, V2 Telemetry:  Telemetry was personally reviewed and demonstrates:  SR, PVCs, HR 70s  Relevant CV Studies:  Echo 02/07/21  1. Left ventricular ejection fraction, by estimation, is 40 to 45%. Left  ventricular ejection fraction by 3D volume is 47 %. The left ventricle has  mildly decreased function. The left ventricle demonstrates global  hypokinesis. Left ventricular diastolic   parameters are consistent with Grade II diastolic dysfunction  (pseudonormalization). Elevated left ventricular end-diastolic pressure.   2. Right ventricular systolic function is severely reduced. The right  ventricular size is mildly enlarged. There is severely elevated pulmonary  artery systolic pressure. The estimated right ventricular systolic  pressure is 78.2 mmHg.   3. Left atrial size was moderately dilated.   4.  Right atrial size was severely dilated.   5. The mitral valve is degenerative with mild to moderate thickening of  the mitral valve leaflets. There is teathering of the MV leaflets due to  mild LV dysfunction. Severe mitral valve regurgitation. Mild to moderate  mitral stenosis. The mean mitral  valve gradient is 6.5 mmHg.   6. Tricuspid valve regurgitation is moderate to severe.   7. The aortic valve is tricuspid. Aortic valve regurgitation is mild.  Mild aortic valve sclerosis is present, with no evidence of aortic valve  stenosis. Aortic regurgitation PHT measures 499 msec.   8. The inferior vena cava is dilated in size with <50% respiratory  variability, suggesting right atrial pressure of 15 mmHg.   9. There is a trivial pericardial effusion posterior to the left  ventricle.  10. Compared to prior echo, Mitral regurgitation appears worse and RV  dysfunction is now present. There is severe Pulmonary HTN.   Cardiac cath 03/2020   Hemodynamics: LV end diastolic pressure is moderately elevated. ----Coronary Angiography----- Ost RCA to Dist RCA lesion is 100% stenosed.-The PDA and PL system fills via faint collaterals from the AV groove LCx and LAD septals. Mid LAD-1 lesion is 60% stenosed. Mid LAD-2 lesion is 50% stenosed. Dist LAD lesion is 55% stenosed with 70% stenosed side branch in 2nd Diag. 1st Diag lesion is 70% stenosed. Ost Cx to Prox Cx lesion is 45% stenosed. Prox-MID Cx lesion is 65% stenosed.   MODERATE-SEVERE THREE-VESSEL CAD: 100% proximal RCA occlusion with left-to-right collaterals faintly filling PDA and PL system (AV groove LCx-PL and LAD septal-PDA) Diffuse moderate mid LAD disease with 60% to 50% stenosis. Tandem 50% and 65% proximal and mid LCx with the 65% lesion being the most significant lesion. Moderately elevated LVEDP of 18-20 mmHg   Given the extent of disease in the  LAD and LCx, neither lesion is very amenable to PCI, and RCA is chronically occluded.   Given her comorbidities, I do not think she would be a good CABG candidate especially in light of the fact that she would likely require valve surgery as well.  She would not recover well.   Recommendations aggressive medical management.       [name], MD    Laboratory Data:  High Sensitivity Troponin:   Recent Labs  Lab 05/18/21 2205 05/18/21 2356  TROPONINIHS 20* 18*     Chemistry Recent Labs  Lab 05/18/21 2205  NA 130*  K 4.0  CL 95*  CO2 26  GLUCOSE 93  BUN 26*  CREATININE 0.95  CALCIUM 9.0  GFRNONAA >60  ANIONGAP 9    Recent Labs  Lab 05/18/21 2205  PROT 6.6  ALBUMIN 3.4*  AST 37  ALT 16  ALKPHOS 94  BILITOT 1.7*   Hematology Recent Labs  Lab 05/18/21 2205  WBC 11.7*  RBC 3.74*  HGB 10.6*  HCT 33.1*  MCV 88.5  MCH 28.3  MCHC 32.0  RDW 16.3*  PLT 310   BNP Recent Labs  Lab 05/18/21 2205  BNP 4,147.9*    DDimer No results for input(s): DDIMER in the last 168 hours.   Radiology/Studies:  US Venous Img Upper Uni Left  Result Date: 05/18/2021 CLINICAL DATA:  Edema EXAM: LEFT UPPER EXTREMITY VENOUS DOPPLER ULTRASOUND TECHNIQUE: Gray-scale sonography with graded compression, as well as color Doppler and duplex ultrasound were performed to evaluate the upper extremity deep venous system from the level of the subclavian vein and including the jugular, axillary, basilic, radial, ulnar and upper cephalic vein. Spectral Doppler was utilized to evaluate flow at rest and with distal augmentation maneuvers. COMPARISON:  None. FINDINGS: Contralateral Subclavian Vein: Respiratory phasicity is normal and symmetric with the symptomatic side. No evidence of thrombus. Normal compressibility. Internal Jugular Vein: No evidence of thrombus. Normal compressibility, respiratory phasicity and response to augmentation. Subclavian Vein: No evidence of thrombus. Normal compressibility, respiratory phasicity and response to augmentation. Axillary Vein: No evidence of  thrombus. Normal compressibility, respiratory phasicity and response to augmentation. Cephalic Vein: No evidence of thrombus. Normal compressibility, respiratory phasicity and response to augmentation. Basilic Vein: No evidence of thrombus. Normal compressibility, respiratory phasicity and response to augmentation. Brachial Veins: No evidence of thrombus. Normal compressibility, respiratory phasicity and response to augmentation. Radial Veins: No evidence of thrombus. Normal compressibility, respiratory phasicity and response to augmentation. Ulnar Veins: No evidence of thrombus. Normal compressibility, respiratory phasicity and response to augmentation. Venous Reflux:  None visualized. Other Findings:  None visualized. IMPRESSION: No evidence of DVT within the left upper extremity. Diffuse subcutaneous edema. Electronically Signed   By: Dorise Bullion III M.D   On: 05/18/2021 23:58   DG Chest Port 1 View  Result Date: 05/18/2021 CLINICAL DATA:  Shortness of breath EXAM: PORTABLE CHEST 1 VIEW COMPARISON:  01/08/2021 FINDINGS: Cardiomegaly. Bilateral airspace disease, right greater than left. Probable layering effusions. No acute bony abnormality. IMPRESSION: Bilateral airspace disease with cardiomegaly and probable layering effusions. Findings could reflect edema or infection. Electronically Signed   By: Rolm Baptise M.D.   On: 05/18/2021 22:03     Assessment and Plan:   Acute on chronic combinedheart faiulre Ischemic CM EF 40-45% Anasarca - Patient presents with 3 weeks worsening SOB, orthopnea, generalized swelling, and facial swelling. BNP elevated to 4,000. Trop minimally elevated. CXR with layering effusions, edema vs infection.  - Patient reports she  was only taking Torsemide 27m as needed, 3 days on and 3 days off. Also reports intermittent compliance with all cardiac medications.  - She has chronic SOB that is multifactorial given prior tobacco use/COPD, possible pulmonary HTN, HFrEF. Now  recently discovered mod to severe MR likely contributing to heart failure. - IV lasix 636mBID - PTA losartan 12.15m83maily and coreg 3.1215m51mD continued - She is severely volume overloaded on exam. So far no UOP recorded. Continue diuresis, may need higher lasix dose it UOP is poor - Monitor creatinine, I/Os, and daily weights  Severe MR - per echo 02/3021 mod thickening of the leaflets, severe MR, mild to moderate stenosis, mean MV gradient 6.15mmH31m can consider TEE while admitted, may need Mitraclip as outpatient  HTN - pressures intermittently elevated - continue coreg, losartan, and IV lasix  Hypoxia Pulmonary HTN COPD - required 2L O2 in the ED, now up to 4-5L - DVT study negative - Duonebs as needed - at some point may require RHC for further evaluation  Chronic diffuse CAD - by cath in 03/2020 recommended medical management - She denies chest pain - Troponin trend flat - continue ASA, statin, Coreg  UTI - per IM  MS - stable - per IM  For questions or updates, please contact [facility] Please consult [email or web address] for contact info under    Signed, Renata Gambino H FurNinfa MeekerC  05/19/2021 1:06 PM

## 2021-05-19 NOTE — ED Notes (Signed)
Patient denies pain and is resting comfortably.  

## 2021-05-20 DIAGNOSIS — J439 Emphysema, unspecified: Secondary | ICD-10-CM | POA: Diagnosis not present

## 2021-05-20 DIAGNOSIS — R601 Generalized edema: Secondary | ICD-10-CM | POA: Diagnosis not present

## 2021-05-20 LAB — CBC
HCT: 31.2 % — ABNORMAL LOW (ref 36.0–46.0)
Hemoglobin: 9.8 g/dL — ABNORMAL LOW (ref 12.0–15.0)
MCH: 28.5 pg (ref 26.0–34.0)
MCHC: 31.4 g/dL (ref 30.0–36.0)
MCV: 90.7 fL (ref 80.0–100.0)
Platelets: 224 10*3/uL (ref 150–400)
RBC: 3.44 MIL/uL — ABNORMAL LOW (ref 3.87–5.11)
RDW: 16.5 % — ABNORMAL HIGH (ref 11.5–15.5)
WBC: 8.3 10*3/uL (ref 4.0–10.5)
nRBC: 0 % (ref 0.0–0.2)

## 2021-05-20 LAB — BASIC METABOLIC PANEL
Anion gap: 8 (ref 5–15)
BUN: 30 mg/dL — ABNORMAL HIGH (ref 6–20)
CO2: 29 mmol/L (ref 22–32)
Calcium: 8.4 mg/dL — ABNORMAL LOW (ref 8.9–10.3)
Chloride: 95 mmol/L — ABNORMAL LOW (ref 98–111)
Creatinine, Ser: 0.99 mg/dL (ref 0.44–1.00)
GFR, Estimated: >60 mL/min (ref >60)
Glucose, Bld: 76 mg/dL (ref 70–99)
Potassium: 3.6 mmol/L (ref 3.5–5.1)
Sodium: 132 mmol/L — ABNORMAL LOW (ref 135–145)

## 2021-05-20 LAB — HIV ANTIBODY (ROUTINE TESTING W REFLEX): HIV Screen 4th Generation wRfx: NONREACTIVE

## 2021-05-20 MED ORDER — ASPIRIN EC 81 MG PO TBEC
81.0000 mg | DELAYED_RELEASE_TABLET | Freq: Every day | ORAL | Status: DC
  Administered 2021-05-20 – 2021-05-28 (×9): 81 mg via ORAL
  Filled 2021-05-20 (×9): qty 1

## 2021-05-20 MED ORDER — ROSUVASTATIN CALCIUM 10 MG PO TABS
20.0000 mg | ORAL_TABLET | Freq: Every day | ORAL | Status: DC
  Administered 2021-05-20 – 2021-05-28 (×9): 20 mg via ORAL
  Filled 2021-05-20 (×11): qty 2

## 2021-05-20 MED ORDER — POTASSIUM CHLORIDE CRYS ER 20 MEQ PO TBCR
30.0000 meq | EXTENDED_RELEASE_TABLET | Freq: Two times a day (BID) | ORAL | Status: DC
  Administered 2021-05-20 – 2021-05-21 (×2): 30 meq via ORAL
  Filled 2021-05-20 (×3): qty 1

## 2021-05-20 NOTE — Progress Notes (Signed)
[place] [facility] [place] NAME: Anita [name]    MR#:  [id number]  DATE OF BIRTH:  Jul 05, 1961  SUBJECTIVE:  CHIEF COMPLAINT:   Chief Complaint  Patient presents with   Facial Swelling   Shortness of Breath  Reports feeling somewhat better than when she first came in although still quite short of breath REVIEW OF SYSTEMS:  Review of Systems  Constitutional:  Positive for malaise/fatigue. Negative for diaphoresis, fever and weight loss.  HENT:  Negative for ear discharge, ear pain, hearing loss, nosebleeds, sore throat and tinnitus.   Eyes:  Negative for blurred vision and pain.  Respiratory:  Positive for shortness of breath. Negative for cough, hemoptysis and wheezing.   Cardiovascular:  Positive for leg swelling. Negative for chest pain, palpitations and orthopnea.  Gastrointestinal:  Negative for abdominal pain, blood in stool, constipation, diarrhea, heartburn, nausea and vomiting.  Genitourinary:  Negative for dysuria, frequency and urgency.  Musculoskeletal:  Negative for back pain and myalgias.  Skin:  Negative for itching and rash.  Neurological:  Negative for dizziness, tingling, tremors, focal weakness, seizures, weakness and headaches.  Psychiatric/Behavioral:  Negative for depression. The patient is not nervous/anxious.   DRUG ALLERGIES:   Allergies  Allergen Reactions   Acthar Hp [Corticotropin] Other (See Comments)    Throat swelling and coughing up blood   Codeine    Prednisone    VITALS:  Blood pressure 120/73, pulse 66, temperature 98 F (36.7 C), resp. rate 17, weight 83.1 kg, SpO2 99 %. PHYSICAL EXAMINATION:  Physical Exam 60 year old female lying in the bed comfortably without any acute distress Neck JVD elevated Lungs decreased breath sounds bilaterally.  Rales and crackles present at the bases bilaterally Cardiovascular T6-A2 normal systolic ejection murmur present at the apical region GI soft, benign, minimal  distention Extremities 2+ bilateral lower extremity pitting edema Neuro alert and oriented, nonfocal LABORATORY PANEL:  Female CBC Recent Labs  Lab 05/20/21 0427  WBC 8.3  HGB 9.8*  HCT 31.2*  PLT 224   ------------------------------------------------------------------------------------------------------------------ Chemistries  Recent Labs  Lab 05/18/21 2205 05/20/21 0427  NA 130* 132*  K 4.0 3.6  CL 95* 95*  CO2 26 29  GLUCOSE 93 76  BUN 26* 30*  CREATININE 0.95 0.99  CALCIUM 9.0 8.4*  AST 37  --   ALT 16  --   ALKPHOS 94  --   BILITOT 1.7*  --    RADIOLOGY:  No results found. ASSESSMENT AND PLAN:  60 year old female with history of multiple sclerosis, COPD, CAD, chronic combined CHF, last EF 40 to 45% 02/2021, ischemic cardiomyopathy, severe mitral regurgitation with limited surgical options, severe pulmonary hypertension, previously on home O2, HTN, ex smoker, presenting with anasarca.       Anasarca   Acute hypoxic respiratory failure due to CHF exacerbation   Acute on chronic combined systolic and diastolic CHF   Ischemic cardiomyopathy   Severe mitral regurgitation - Last echo on file from March 2022 with grade 2 diastolic dysfunction EF 40 to 45% with severe mitral regurg - On admission BNP 4147 with troponin trending down, EKG nonacute and no chest pain - Suspect related to severe mitral regurg as well as severe pulmonary hypertension - IV Lasix 60 mg twice daily - Continue home Coreg and losartan - Daily weights with intake and output monitoring and salt and fluid restriction Net IO Since Admission: -1,485 mL [05/20/21 1502]  - Cardiology input appreciated - LUE  venous Doppler Negative for DVT -Requiring 2 L oxygen via nasal cannula.  Wean oxygen as able     Multiple sclerosis (HCC) - stable     Essential hypertension - Continue Coreg and losartan   Hypoxia Pulmonary hypertension (HCC) COPD (chronic obstructive pulmonary disease) (HCC) - O2 sat  89% on room air requiring 2 L O2 - Patient was previously on home O2, not currently.  May need home O2 eval prior to discharge. will need oxygen qualification - DuoNebs as needed   CAD - Troponin trending down with nonacute EKG and no chest pain - Continue aspirin, rosuvastatin and Coreg   Possible UTI (urinary tract infection) - Urinalysis with large LE and many bacteria but no urinary symptoms or fever.  No leukocytosis - Await culture.  Hold off antibiotics for now  Body mass index is 32.47 kg/m.    Status is: Inpatient  Remains inpatient appropriate because:Ongoing diagnostic testing needed not appropriate for outpatient work up  Dispo: The patient is from: Home              Anticipated d/c is to: Home              Patient currently is not medically stable to d/c.   Difficult to place patient No   DVT prophylaxis:       SCDs Start: 05/19/21 0129    Family Communication: Updated daughter [name] over phone on 6/13   All the records are reviewed and case discussed with Care Management/Social Worker. Management plans discussed with the patient, family and they are in agreement.  CODE STATUS: Full Code Level of care: Progressive Cardiac  TOTAL TIME TAKING CARE OF THIS PATIENT: 35 minutes.   More than 50% of the time was spent in counseling/coordination of care: YES  POSSIBLE D/C IN 2-3 DAYS, DEPENDING ON CLINICAL CONDITION.   Max Sane M.D on 05/20/2021 at 2:48 PM  [facility]   CC: Primary care physician; [name], NP  Note: This dictation was prepared with Dragon dictation along with smaller phrase technology. Any transcriptional errors that result from this process are unintentional. Recommended

## 2021-05-20 NOTE — Progress Notes (Signed)
Progress Note  Patient Name: Anita [name] Date of Encounter: 05/20/2021  Primary Cardiologist: Harrell Gave  Subjective   Feels like her breathing is improving, not back to baseline. No chest pain or palpitations. Remains on supplemental oxygen at 2 L via nasal cannula. She is uncertain what her dry weight should be. Documented UOP 530 mL for the past 24 hours and admission to date. Weight trend inaccurate with an admission weight of 69 kg and a weight this morning of 83.1 kg (bed scale). Renal function stable. Sodium improved. HGB low, though relatively stable.   Inpatient Medications    Scheduled Meds:  aspirin EC  81 mg Oral Daily   carvedilol  3.125 mg Oral BID WC   furosemide  60 mg Intravenous BID   losartan  12.5 mg Oral Daily   potassium chloride  30 mEq Oral BID   rosuvastatin  20 mg Oral Daily   sodium chloride flush  3 mL Intravenous Q12H   Continuous Infusions:  sodium chloride     PRN Meds: sodium chloride, acetaminophen **OR** acetaminophen, albuterol, HYDROcodone-acetaminophen, nitroGLYCERIN, ondansetron **OR** ondansetron (ZOFRAN) IV, senna-docusate, sodium chloride flush   Vital Signs    Vitals:   05/19/21 2018 05/20/21 0303 05/20/21 0317 05/20/21 0720  BP:  122/72  133/84  Pulse:  61  66  Resp:  20  16  Temp: 98.5 F (36.9 C) (!) 96.3 F (35.7 C)  97.9 F (36.6 C)  TempSrc: Oral Axillary  Oral  SpO2:  99%  100%  Weight:   83.1 kg     Intake/Output Summary (Last 24 hours) at 05/20/2021 0840 Last data filed at 05/20/2021 0500 Gross per 24 hour  Intake 120 ml  Output 650 ml  Net -530 ml   Filed Weights   05/18/21 2005 05/20/21 0317  Weight: 69.4 kg 83.1 kg    Telemetry    SR with rare PVCs - Personally Reviewed  ECG    No new tracings - Personally Reviewed  Physical Exam   GEN: No acute distress.   Neck: JVD elevated to the angle of the mandible. Cardiac: RRR, II/VI systolic murmur at the apex, no rubs, or gallops.   Respiratory: Diminished breath sounds bilaterally with diffuse crackles.  GI: Soft, nontender, mildly distended.   MS: 1-2+ bilateral lower extremity pitting edema to the sacrum; No deformity. Neuro:  Alert and oriented x 3; Nonfocal.  Psych: Normal affect.  Labs    Chemistry Recent Labs  Lab 05/18/21 2205 05/20/21 0427  NA 130* 132*  K 4.0 3.6  CL 95* 95*  CO2 26 29  GLUCOSE 93 76  BUN 26* 30*  CREATININE 0.95 0.99  CALCIUM 9.0 8.4*  PROT 6.6  --   ALBUMIN 3.4*  --   AST 37  --   ALT 16  --   ALKPHOS 94  --   BILITOT 1.7*  --   GFRNONAA >60 >60  ANIONGAP 9 8     Hematology Recent Labs  Lab 05/18/21 2205 05/20/21 0427  WBC 11.7* 8.3  RBC 3.74* 3.44*  HGB 10.6* 9.8*  HCT 33.1* 31.2*  MCV 88.5 90.7  MCH 28.3 28.5  MCHC 32.0 31.4  RDW 16.3* 16.5*  PLT 310 224    Cardiac EnzymesNo results for input(s): TROPONINI in the last 168 hours. No results for input(s): TROPIPOC in the last 168 hours.   BNP Recent Labs  Lab 05/18/21 2205  BNP 4,147.9*     DDimer No results for  input(s): DDIMER in the last 168 hours.   Radiology    US Venous Img Upper Uni Left  Result Date: 05/18/2021 IMPRESSION: No evidence of DVT within the left upper extremity. Diffuse subcutaneous edema. Electronically Signed   By: Dorise Bullion III M.D   On: 05/18/2021 23:58   DG Chest Port 1 View  Result Date: 05/18/2021 IMPRESSION: Bilateral airspace disease with cardiomegaly and probable layering effusions. Findings could reflect edema or infection. Electronically Signed   By: Rolm Baptise M.D.   On: 05/18/2021 22:03    Cardiac Studies   2D echo 03/20/2020: 1. Left ventricular ejection fraction, by estimation, is 40 to 45%. The  left ventricle has mildly decreased function. The left ventricle  demonstrates global hypokinesis. The left ventricular internal cavity size  was mildly dilated. Left ventricular  diastolic function could not be evaluated.   2. Right ventricular systolic  function is normal. The right ventricular  size is normal. There is moderately elevated pulmonary artery systolic  pressure.   3. Left atrial size was mildly dilated.   4. Moderate mitral subvalvular thickening/fibrosis.   5. The mitral valve is rheumatic. Moderate to severe mitral valve  regurgitation. Mild mitral stenosis. The mean mitral valve gradient is 7.3  mmHg with average heart rate of 105 bpm.   6. The aortic valve is normal in structure. Aortic valve regurgitation is  not visualized.   7. The inferior vena cava is dilated in size with <50% respiratory  variability, suggesting right atrial pressure of 15 mmHg.   Conclusion(s)/Recommendation(s): Consider TEE for better mitral  regurgitation evaluation.  __________  [facility] 03/23/2020:  Hemodynamics: LV end diastolic pressure is moderately elevated. ----Coronary Angiography----- Ost RCA to Dist RCA lesion is 100% stenosed.-The PDA and PL system fills via faint collaterals from the AV groove LCx and LAD septals. Mid LAD-1 lesion is 60% stenosed. Mid LAD-2 lesion is 50% stenosed. Dist LAD lesion is 55% stenosed with 70% stenosed side branch in 2nd Diag. 1st Diag lesion is 70% stenosed. Ost Cx to Prox Cx lesion is 45% stenosed. Prox-MID Cx lesion is 65% stenosed.   MODERATE-SEVERE THREE-VESSEL CAD: 100% proximal RCA occlusion with left-to-right collaterals faintly filling PDA and PL system (AV groove LCx-PL and LAD septal-PDA) Diffuse moderate mid LAD disease with 60% to 50% stenosis. Tandem 50% and 65% proximal and mid LCx with the 65% lesion being the most significant lesion. Moderately elevated LVEDP of 18-20 mmHg   Given the extent of disease in the LAD and LCx, neither lesion is very amenable to PCI, and RCA is chronically occluded.  Given her comorbidities, I do not think she would be a good CABG candidate especially in light of the fact that she would likely require valve surgery as well.  She would not recover well.    Recommendations aggressive medical management. __________  2D echo 02/07/2021: 1. Left ventricular ejection fraction, by estimation, is 40 to 45%. Left  ventricular ejection fraction by 3D volume is 47 %. The left ventricle has  mildly decreased function. The left ventricle demonstrates global  hypokinesis. Left ventricular diastolic   parameters are consistent with Grade II diastolic dysfunction  (pseudonormalization). Elevated left ventricular end-diastolic pressure.   2. Right ventricular systolic function is severely reduced. The right  ventricular size is mildly enlarged. There is severely elevated pulmonary  artery systolic pressure. The estimated right ventricular systolic  pressure is 70.6 mmHg.   3. Left atrial size was moderately dilated.   4. Right atrial size  was severely dilated.   5. The mitral valve is degenerative with mild to moderate thickening of  the mitral valve leaflets. There is teathering of the MV leaflets due to  mild LV dysfunction. Severe mitral valve regurgitation. Mild to moderate  mitral stenosis. The mean mitral  valve gradient is 6.5 mmHg.   6. Tricuspid valve regurgitation is moderate to severe.   7. The aortic valve is tricuspid. Aortic valve regurgitation is mild.  Mild aortic valve sclerosis is present, with no evidence of aortic valve  stenosis. Aortic regurgitation PHT measures 499 msec.   8. The inferior vena cava is dilated in size with <50% respiratory  variability, suggesting right atrial pressure of 15 mmHg.   9. There is a trivial pericardial effusion posterior to the left  ventricle.  10. Compared to prior echo, Mitral regurgitation appears worse and RV  dysfunction is now present. There is severe Pulmonary HTN.    Patient Profile     60 y.o. female with history of multivessel CAD medically managed as above, chronic combined systolic and diastolic CHF secondary to ICM, moderate to severe mitral regurgitation with rheumatic appearing  mitral valve without severe mitral stenosis, pulmonary hypertension, MS, COPD, anemia, prior tobacco use and chronic dyspnea who was admitted with acute on chronic combined systolic and diastolic CHF.   Assessment & Plan    1. Acute hypoxic respiratory failure: -Multifactorial including acute on chronic combined CHF, pulmonary hypertension, moderate to severe mitral regurgitation, COPD, anemia, and physical deconditioning  -Wean supplemental oxygen as tolerated, remains on 2 L via nasal cannula  2. Acute on chronic combined systolic and diastolic CHF/ICM/pulmonary hypertension: -She remains volume up -IV Lasix 60 mg bid with KCl -Continue current GDMT including Coreg and losartan, look to escalate evidence-based therapy throughout her admission as vitals and labs allow in the next 24-48 hours -May need a RHC pending symptom trend, UOP, and renal function  -Weight trend inaccurate -Recommend daily standing scale weights -Strict Ins and outs -CHF education   3. Moderate to severe mitral regurgitation: -Contributing to overall presentation  -Consider TEE following diuresis, possibly outpatient with primary cardiologist   4. CAD involving the native coronary arteries with elevated HS-TN: -Minimal elevation in high sensitivity troponin that is flat trending and not consistent with ACS -Likely supply demand ischemia in the setting of known CTO of the RCA along with moderate disease involving the left coronary tree with volume overload  -Resume PTA ASA and Crestor   5. COPD: -Per IM  6. Anemia: -No obvious bleeding -Monitor    For questions or updates, please contact [place] Please consult [email or web address] for contact info under Cardiology/STEMI.    Signed, Christell Faith, PA-C [facility] Pager: [phone] 05/20/2021, 8:40 AM

## 2021-05-20 NOTE — Consult Note (Addendum)
   Heart Failure Nurse Navigator Note ' HFmrEf 40-45%.  Severe mitral regurgitation  She presented to the emergency room with chest pain and shortness of breath with exertion and orthopnea.  Comorbidities:  COPD Coronary artery disease Severe pulmonary hypertension Hypertension  Medications:  Aspirin 81 g daily Coreg 3.125 mg  furosemide 60 mg 2 times a day IV Losartan 12.5 mg daily Crestor 20 mg daily  Labs:  Sodium 132, potassium 3.6, chloride 95, CO2 29, BUN 30, creatinine 0.99, hemoglobin 9.8, BNP on admission 4147. Weight- 83.1 kg, yesterday documented 69.4 Intake 120 mL Output 650 ml  Initial meeting with patient.  She is awake and alert lying in bed, states that she is not back to normal yet with her breathing.  She states that she lives at home with her daughter.  States that her daughter keeps track of medications.  She admits to not weighing on a daily basis.  States that they have mostly grilled or baked meats.  She does not use salt  at the table.  Stressed the importance of daily weight and recording.  Reporting 2 pound weight gain overnight or 5 pounds within a week to her physician.  She  is questioning the importance of potassium.  Explained that this is an important element  for function of her body.  She was given information about the outpatient heart failure clinic and stressed the importance of following up with the heart failure clinic.  Given living with heart failure booklet, zone information on items that contain sodium along with taking control of your heart failure.  Will continue to follow.  Pricilla Riffle RN CHFN

## 2021-05-21 DIAGNOSIS — I10 Essential (primary) hypertension: Secondary | ICD-10-CM

## 2021-05-21 DIAGNOSIS — G35 Multiple sclerosis: Secondary | ICD-10-CM

## 2021-05-21 DIAGNOSIS — J439 Emphysema, unspecified: Secondary | ICD-10-CM

## 2021-05-21 DIAGNOSIS — I255 Ischemic cardiomyopathy: Secondary | ICD-10-CM

## 2021-05-21 DIAGNOSIS — R601 Generalized edema: Secondary | ICD-10-CM

## 2021-05-21 DIAGNOSIS — I272 Pulmonary hypertension, unspecified: Secondary | ICD-10-CM

## 2021-05-21 LAB — BASIC METABOLIC PANEL
Anion gap: 7 (ref 5–15)
BUN: 29 mg/dL — ABNORMAL HIGH (ref 6–20)
CO2: 36 mmol/L — ABNORMAL HIGH (ref 22–32)
Calcium: 8.6 mg/dL — ABNORMAL LOW (ref 8.9–10.3)
Chloride: 90 mmol/L — ABNORMAL LOW (ref 98–111)
Creatinine, Ser: 1.26 mg/dL — ABNORMAL HIGH (ref 0.44–1.00)
GFR, Estimated: 49 mL/min — ABNORMAL LOW (ref >60)
Glucose, Bld: 89 mg/dL (ref 70–99)
Potassium: 3.8 mmol/L (ref 3.5–5.1)
Sodium: 133 mmol/L — ABNORMAL LOW (ref 135–145)

## 2021-05-21 LAB — CBC
HCT: 34.3 % — ABNORMAL LOW (ref 36.0–46.0)
Hemoglobin: 10.6 g/dL — ABNORMAL LOW (ref 12.0–15.0)
MCH: 28.3 pg (ref 26.0–34.0)
MCHC: 30.9 g/dL (ref 30.0–36.0)
MCV: 91.7 fL (ref 80.0–100.0)
Platelets: 270 10*3/uL (ref 150–400)
RBC: 3.74 MIL/uL — ABNORMAL LOW (ref 3.87–5.11)
RDW: 16.9 % — ABNORMAL HIGH (ref 11.5–15.5)
WBC: 8.5 10*3/uL (ref 4.0–10.5)
nRBC: 0 % (ref 0.0–0.2)

## 2021-05-21 MED ORDER — POTASSIUM CHLORIDE CRYS ER 20 MEQ PO TBCR
30.0000 meq | EXTENDED_RELEASE_TABLET | Freq: Every day | ORAL | Status: DC
  Administered 2021-05-22 – 2021-05-28 (×7): 30 meq via ORAL
  Filled 2021-05-21 (×8): qty 1

## 2021-05-21 MED ORDER — FUROSEMIDE 10 MG/ML IJ SOLN
60.0000 mg | Freq: Every day | INTRAMUSCULAR | Status: DC
  Administered 2021-05-22: 60 mg via INTRAVENOUS
  Filled 2021-05-21: qty 6

## 2021-05-21 MED ORDER — ENOXAPARIN SODIUM 40 MG/0.4ML IJ SOSY
40.0000 mg | PREFILLED_SYRINGE | INTRAMUSCULAR | Status: DC
  Administered 2021-05-21 – 2021-05-28 (×8): 40 mg via SUBCUTANEOUS
  Filled 2021-05-21 (×8): qty 0.4

## 2021-05-21 NOTE — TOC Initial Note (Signed)
Transition of Care [facility]) - Initial/Assessment Note    Patient Details  Name: Anita [name] MRN: [id number] Date of Birth: 01-Aug-1961  Transition of Care [facility]) CM/SW Contact:    Eileen Stanford, LCSW Phone Number: 05/21/2021, 3:40 PM  Clinical Narrative:       Pt is only alert to self and place. CSW spoke with pt's daughter. Pt's daughter is refusing for pt to go to SNF. Pt has been to SNF in the past and pt's daughter said it made her much worse. Pt's daughter works from home and states she will be there with the pt. Pt's daughter is requesting HH. Pt's daughter states they have used [facility] in the past and liked them and would like to use them again. CSW has provided the referral to [facility] and they will service pt. Pt's daughters states pt has a walker and wheelchair at home--pt's daughter states no additional DME is needed.            Expected Discharge Plan: [place] Barriers to Discharge: Continued Medical Work up   Patient Goals and CMS Choice Patient states their goals for this hospitalization and ongoing recovery are:: per pt's daughter for pt to return home with home health   Choice offered to / list presented to : Adult Children  Expected Discharge Plan and Services Expected Discharge Plan: [place] In-house Referral: Clinical Social Work   Post Acute Care Choice: [place] arrangements for the past 2 months: [place]: PT, OT [place] Agency: [place] Date [place]: 05/21/21 Time [place]: 1539 Representative spoke with at [place]: cheryl  Prior Living Arrangements/Services Living arrangements for the past 2 months: [place] with:: Adult Children Patient language and need for interpreter reviewed:: Yes Do you feel safe going back to the place where you live?: Yes      Need for Family Participation in  Patient Care: Yes (Comment) Care giver support system in place?: Yes (comment)   Criminal Activity/Legal Involvement Pertinent to Current Situation/Hospitalization: No - Comment as needed  Activities of Daily Living Home Assistive Devices/Equipment: Walker (specify type) ADL Screening (condition at time of admission) Patient's cognitive ability adequate to safely complete daily activities?: Yes Is the patient deaf or have difficulty hearing?: No Does the patient have difficulty seeing, even when wearing glasses/contacts?: No Does the patient have difficulty concentrating, remembering, or making decisions?: No Patient able to express need for assistance with ADLs?: Yes Does the patient have difficulty dressing or bathing?: Yes Independently performs ADLs?: No Communication: Independent Dressing (OT): Needs assistance Grooming: Needs assistance Feeding: Independent with device (comment) Bathing: Needs assistance Toileting: Needs assistance In/Out Bed: Needs assistance Walks in Home: Needs assistance Is this a change from baseline?: Pre-admission baseline Does the patient have difficulty walking or climbing stairs?: Yes Weakness of Legs: Both Weakness of Arms/Hands: Both  Permission Sought/Granted Permission sought to share information with : Family Supports Permission granted to share information with : Yes, Release of Information Signed  Share Information with NAME: [name]  Permission granted to share info w AGENCY: [facility]  Permission granted to share info w Relationship: daughter     Emotional Assessment Appearance:: Appears stated age Attitude/Demeanor/Rapport: Unable to Assess Affect (typically observed): Unable to Assess Orientation: :  Oriented to Self, Oriented to Place Alcohol / Substance Use: Not Applicable Psych Involvement: No (comment)  Admission diagnosis:  Anasarca [R60.1] CHF (congestive heart failure) (HCC) [I50.9] Facial swelling [R22.0] Hypoxia  [R09.02] Patient Active Problem List   Diagnosis Date Noted   Severe mitral regurgitation 05/19/2021   UTI (urinary tract infection) 05/19/2021   CHF (congestive heart failure) ([place]) 05/19/2021   Hypoxia 05/19/2021   Anasarca 05/19/2021   Esophageal dysphagia 01/08/2021   Ischemic cardiomyopathy 10/08/2020   Acute on chronic combined systolic and diastolic CHF (congestive heart failure) ([place]) 10/08/2020   Coronary artery disease involving native coronary artery of native heart without angina pectoris 10/08/2020   IDA (iron deficiency anemia) 10/05/2020   Vitamin B12 deficiency 08/23/2020   COPD (chronic obstructive pulmonary disease) ([place]) 06/22/2020   Tobacco abuse 31/43/8887   Acute systolic CHF (congestive heart failure) ([place]) 03/22/2020   Pulmonary hypertension ([place]) 03/22/2020   Retroperitoneal mass 03/22/2020   Insomnia 03/22/2020   NSTEMI (non-ST elevated myocardial infarction) ([place]) 03/20/2020   SIRS (systemic inflammatory response syndrome) ([place]) 03/20/2020   Normocytic anemia 03/20/2020   AKI (acute kidney injury) ([place]) 03/20/2020   Gastroesophageal reflux disease 02/15/2018   Chronic migraine without aura with status migrainosus, not intractable 02/15/2018   Osteoarthritis 02/15/2018   Essential hypertension 02/15/2018   Disturbance of skin sensation 03/26/2012   Multiple sclerosis ([place]) 03/26/2012   PCP:  [name], NP Pharmacy:   [id number] - [place], [place] [address] [place] [address] Phone: [phone] Fax: [phone]     Social Determinants of Health (SDOH) Interventions    Readmission Risk Interventions No flowsheet data found.

## 2021-05-21 NOTE — Progress Notes (Signed)
PROGRESS NOTE    Anita [name]  AOZ:[id number] DOB: 04/13/1961 DOA: 05/18/2021 PCP: [name], NP    Brief Narrative:  60 year old female with history of multiple sclerosis, COPD, CAD, chronic combined CHF, last EF 40 to 45% 02/2021, ischemic cardiomyopathy, severe mitral regurgitation with limited surgical options, severe pulmonary hypertension, previously on home O2, HTN, ex smoker, presenting with anasarca.    Patient has been diuresing effectively on Lasix 40 mg IV twice daily.  She remains significantly anasarcic.   Assessment & Plan:   Principal Problem:   Acute on chronic combined systolic and diastolic CHF (congestive heart failure) (HCC) Active Problems:   Multiple sclerosis (HCC)   Essential hypertension   Pulmonary hypertension (HCC)   COPD (chronic obstructive pulmonary disease) (HCC)   Ischemic cardiomyopathy   Coronary artery disease involving native coronary artery of native heart without angina pectoris   Severe mitral regurgitation   UTI (urinary tract infection)   CHF (congestive heart failure) (HCC)   Hypoxia   Anasarca  Anasarca   Acute hypoxic respiratory failure due to CHF exacerbation   Acute on chronic combined systolic and diastolic CHF   Ischemic cardiomyopathy   Severe mitral regurgitation - Last echo on file from March 2022 with grade 2 diastolic dysfunction EF 40 to 45% with severe mitral regurg - On admission BNP 4147 with troponin trending down, EKG nonacute and no chest pain - Suspect related to severe mitral regurg as well as severe pulmonary hypertension -Slight bump in kidney function Plan: Continue IV Lasix 60 mg once daily given rising kidney function Will need to reconsider depending on 24-hour urine output Lasix GTT is another possibility if fluid management becomes an issue Continue home Coreg and losartan Continue daily weights and strict I's notes Wean oxygen as tolerated,     Multiple sclerosis (HCC) - stable      Essential hypertension - Continue Coreg and losartan   Hypoxia Pulmonary hypertension (HCC) COPD (chronic obstructive pulmonary disease) (HCC) - O2 sat 89% on room air requiring 2 L O2 - Patient was previously on home O2, not currently.  May need home O2 eval prior to discharge. will need oxygen qualification - DuoNebs as needed   CAD - Troponin trending down with nonacute EKG and no chest pain - Continue aspirin, rosuvastatin and Coreg   Possible UTI (urinary tract infection) - Urinalysis with large LE and many bacteria but no urinary symptoms or fever.  No leukocytosis - It appears no culture was sent on the original UA -Patient not having any urinary symptoms -No negation for antibiotics at this time   DVT prophylaxis: SQ Lovenox Code Status: Full Family Communication: none today Disposition Plan: Status is: Inpatient  Remains inpatient appropriate because:Inpatient level of care appropriate due to severity of illness  Dispo: The patient is from: Home              Anticipated d/c is to: SNF              Patient currently is not medically stable to d/c.   Difficult to place patient No       Level of care: Progressive Cardiac  Consultants:  Cardiology  Procedures:  None  Antimicrobials:  None   Subjective: Seen and examined.  Denies any specific pain complaints  Objective: Vitals:   05/21/21 0953 05/21/21 1122 05/21/21 1623 05/21/21 1624  BP:  114/76 104/66 113/66  Pulse:  71 68 65  Resp:  18 18   Temp:  97.7 F (36.5 C) 97.8 F (36.6 C)   TempSrc:      SpO2:  94% 96% 96%  Weight: 78.5 kg       Intake/Output Summary (Last 24 hours) at 05/21/2021 1645 Last data filed at 05/21/2021 1438 Gross per 24 hour  Intake 723 ml  Output 2700 ml  Net -1977 ml   Filed Weights   05/20/21 0317 05/21/21 0431 05/21/21 0953  Weight: 83.1 kg 86.7 kg 78.5 kg    Examination:  General exam: Appears calm and comfortable  Respiratory system: Clear to  auscultation. Respiratory effort normal. Cardiovascular system: T7-D2, systolic murmur, 2+ pitting edema bilaterally Gastrointestinal system: Abdomen is nondistended, soft and nontender. No organomegaly or masses felt. Normal bowel sounds heard. Central nervous system: Alert and oriented. No focal neurological deficits. Extremities: Symmetric 5 x 5 power. Skin: No rashes, lesions or ulcers Psychiatry: Judgement and insight appear normal. Mood & affect appropriate.     Data Reviewed: I have personally reviewed following labs and imaging studies  CBC: Recent Labs  Lab 05/18/21 2205 05/20/21 0427 05/21/21 0513  WBC 11.7* 8.3 8.5  NEUTROABS 10.1*  --   --   HGB 10.6* 9.8* 10.6*  HCT 33.1* 31.2* 34.3*  MCV 88.5 90.7 91.7  PLT 310 224 202   Basic Metabolic Panel: Recent Labs  Lab 05/18/21 2205 05/20/21 0427 05/21/21 0513  NA 130* 132* 133*  K 4.0 3.6 3.8  CL 95* 95* 90*  CO2 26 29 36*  GLUCOSE 93 76 89  BUN 26* 30* 29*  CREATININE 0.95 0.99 1.26*  CALCIUM 9.0 8.4* 8.6*   GFR: Estimated Creatinine Clearance: 48.2 mL/min (A) (by C-G formula based on SCr of 1.26 mg/dL (H)). Liver Function Tests: Recent Labs  Lab 05/18/21 2205  AST 37  ALT 16  ALKPHOS 94  BILITOT 1.7*  PROT 6.6  ALBUMIN 3.4*   No results for input(s): LIPASE, AMYLASE in the last 168 hours. No results for input(s): AMMONIA in the last 168 hours. Coagulation Profile: No results for input(s): INR, PROTIME in the last 168 hours. Cardiac Enzymes: No results for input(s): CKTOTAL, CKMB, CKMBINDEX, TROPONINI in the last 168 hours. BNP (last 3 results) No results for input(s): PROBNP in the last 8760 hours. HbA1C: No results for input(s): HGBA1C in the last 72 hours. CBG: Recent Labs  Lab 05/19/21 2254  GLUCAP 100*   Lipid Profile: No results for input(s): CHOL, HDL, LDLCALC, TRIG, CHOLHDL, LDLDIRECT in the last 72 hours. Thyroid Function Tests: No results for input(s): TSH, T4TOTAL, FREET4,  T3FREE, THYROIDAB in the last 72 hours. Anemia Panel: No results for input(s): VITAMINB12, FOLATE, FERRITIN, TIBC, IRON, RETICCTPCT in the last 72 hours. Sepsis Labs: Recent Labs  Lab 05/18/21 2346  LATICACIDVEN 1.8    Recent Results (from the past 240 hour(s))  Resp Panel by RT-PCR (Flu A&B, Covid) Nasopharyngeal Swab     Status: None   Collection Time: 05/18/21 10:05 PM   Specimen: Nasopharyngeal Swab; Nasopharyngeal(NP) swabs in vial transport medium  Result Value Ref Range Status   SARS Coronavirus 2 by RT PCR NEGATIVE NEGATIVE Final    Comment: (NOTE) SARS-CoV-2 target nucleic acids are NOT DETECTED.  The SARS-CoV-2 RNA is generally detectable in upper respiratory specimens during the acute phase of infection. The lowest concentration of SARS-CoV-2 viral copies this assay can detect is 138 copies/mL. A negative result does not preclude SARS-Cov-2 infection and should not be used as the sole basis for treatment or other patient management  decisions. A negative result may occur with  improper specimen collection/handling, submission of specimen other than nasopharyngeal swab, presence of viral mutation(s) within the areas targeted by this assay, and inadequate number of viral copies(<138 copies/mL). A negative result must be combined with clinical observations, patient history, and epidemiological information. The expected result is Negative.  Fact Sheet for Patients:  EntrepreneurPulse.com.au  Fact Sheet for Healthcare Providers:  IncredibleEmployment.be  This test is no t yet approved or cleared by the [place] FDA and  has been authorized for detection and/or diagnosis of SARS-CoV-2 by FDA under an Emergency Use Authorization (EUA). This EUA will remain  in effect (meaning this test can be used) for the duration of the COVID-19 declaration under Section 564(b)(1) of the Act, 21 U.S.C.section 360bbb-3(b)(1), unless the  authorization is terminated  or revoked sooner.       Influenza A by PCR NEGATIVE NEGATIVE Final   Influenza B by PCR NEGATIVE NEGATIVE Final    Comment: (NOTE) The Xpert Xpress SARS-CoV-2/FLU/RSV plus assay is intended as an aid in the diagnosis of influenza from Nasopharyngeal swab specimens and should not be used as a sole basis for treatment. Nasal washings and aspirates are unacceptable for Xpert Xpress SARS-CoV-2/FLU/RSV testing.  Fact Sheet for Patients: EntrepreneurPulse.com.au  Fact Sheet for Healthcare Providers: IncredibleEmployment.be  This test is not yet approved or cleared by the [place] FDA and has been authorized for detection and/or diagnosis of SARS-CoV-2 by FDA under an Emergency Use Authorization (EUA). This EUA will remain in effect (meaning this test can be used) for the duration of the COVID-19 declaration under Section 564(b)(1) of the Act, 21 U.S.C. section 360bbb-3(b)(1), unless the authorization is terminated or revoked.  Performed at [facility], [address], [place], [place] [address]          Radiology Studies: No results found.      Scheduled Meds:  aspirin EC  81 mg Oral Daily   carvedilol  3.125 mg Oral BID WC   enoxaparin (LOVENOX) injection  40 mg Subcutaneous Q24H   [START ON 05/22/2021] furosemide  60 mg Intravenous Daily   losartan  12.5 mg Oral Daily   [START ON 05/22/2021] potassium chloride  30 mEq Oral Daily   rosuvastatin  20 mg Oral Daily   sodium chloride flush  3 mL Intravenous Q12H   Continuous Infusions:  sodium chloride       LOS: 2 days    Time spent: 25 minutes    Sidney Ace, MD [facility] Pager [phone]  If 7PM-7AM, please contact night-coverage 05/21/2021, 4:45 PM

## 2021-05-21 NOTE — Evaluation (Signed)
Physical Therapy Evaluation Patient Details Name: Anita [name] MRN: [id number] DOB: 08-Jun-1961 Today's Date: 05/21/2021   History of Present Illness  Pt admitted for acute/chronic systolic and diastolic CHF with complaints of facial swelling and SOB symptoms x 3 weeks. History includes MS, CHF, COPD, and CAD.  Clinical Impression  Pt is a pleasant 60 year old female who was admitted for acute/chronic systolic and diastolic CHF. Pt performs bed mobility/transfers with mod assist +2 and HHA. Unable to ambulate at this time. Pt with O2 sats at RA decreasing to 81% with exertion. Reapplied 2L of O2 with sats improving to 94%. Pt demonstrates deficits with strength/mobility/numbness. Reports the last time she was ambulatory was 2 days before admission. Would benefit from skilled PT to address above deficits and promote optimal return to PLOF; recommend transition to STR upon discharge from acute hospitalization. Pt currently refusing SNF placement stating she only wants to go home.      Follow Up Recommendations SNF    Equipment Recommendations  Wheelchair (measurements PT)    Recommendations for Other Services       Precautions / Restrictions Precautions Precautions: Fall Restrictions Weight Bearing Restrictions: No      Mobility  Bed Mobility Overal bed mobility: Needs Assistance Bed Mobility: Supine to Sit     Supine to sit: Mod assist;+2 for physical assistance     General bed mobility comments: heavy assist for trunkal elevation, mod assist for sliding B LEs off bed. ONce seated, needs mod assist for seated balance, improving with time to supervision    Transfers Overall transfer level: Needs assistance Equipment used: 2 person hand held assist Transfers: Sit to/from Stand Sit to Stand: Mod assist;+2 physical assistance         General transfer comment: needs +2 assist. Able to perform 3 reps prior to fatigue. Able to stand on upright scale and then 2 further  times for strengthening. Unable to perform weight shifts for taking steps at this time  Ambulation/Gait             General Gait Details: unable  Stairs            Wheelchair Mobility    Modified Rankin (Stroke Patients Only)       Balance Overall balance assessment: Needs assistance Sitting-balance support: Feet supported;Bilateral upper extremity supported Sitting balance-Leahy Scale: Fair     Standing balance support: Bilateral upper extremity supported Standing balance-Leahy Scale: Poor                               Pertinent Vitals/Pain Pain Assessment: No/denies pain    Home Living Family/patient expects to be discharged to:: Private residence Living Arrangements: Children Available Help at Discharge: Family;Available 24 hours/day (daughter works from home, however available for help) Type of Home: House Home Access: Stairs to enter Entrance Stairs-Rails: None Entrance Stairs-Number of Steps: 1 Home Layout: One level Home Equipment: Environmental consultant - 4 wheels;Bedside commode      Prior Function Level of Independence: Needs assistance         Comments: pt is very vague and doesn't answer questions directly. Reports daughter assist with "things" when she can't. Tried to elaborate which specific ADLs? Pt only able to walk very short distances using RW. Spends majority of time in bed due to weakness.     Hand Dominance        Extremity/Trunk Assessment   Upper Extremity Assessment Upper Extremity Assessment:  Generalized weakness (B UE grossly 4/5; reports B hand numbness-chronic)    Lower Extremity Assessment Lower Extremity Assessment: Generalized weakness (B LE grossly 3/5- reports B foot numbness chronic)       Communication   Communication: No difficulties  Cognition Arousal/Alertness: Awake/alert Behavior During Therapy: WFL for tasks assessed/performed Overall Cognitive Status: Within Functional Limits for tasks assessed                                  General Comments: generally annoyed with therapist asking her to perform mobility      General Comments      Exercises Other Exercises Other Exercises: Supine ther-ex performed on B LE including B AP and QS. Attempted SLRs with difficulty. 10 reps performed with cga   Assessment/Plan    PT Assessment Patient needs continued PT services  PT Problem List Decreased strength;Decreased balance;Decreased mobility;Decreased safety awareness;Impaired sensation;Obesity;Decreased activity tolerance       PT Treatment Interventions Gait training;Therapeutic activities;Therapeutic exercise;Balance training    PT Goals (Current goals can be found in the Care Plan section)  Acute Rehab PT Goals Patient Stated Goal: to go home with daughter PT Goal Formulation: With patient Time For Goal Achievement: 06/04/21 Potential to Achieve Goals: Good    Frequency Min 2X/week   Barriers to discharge        Co-evaluation               AM-PAC PT "6 Clicks" Mobility  Outcome Measure Help needed turning from your back to your side while in a flat bed without using bedrails?: A Lot Help needed moving from lying on your back to sitting on the side of a flat bed without using bedrails?: A Lot Help needed moving to and from a bed to a chair (including a wheelchair)?: A Lot Help needed standing up from a chair using your arms (e.g., wheelchair or bedside chair)?: A Lot Help needed to walk in hospital room?: Total Help needed climbing 3-5 steps with a railing? : Total 6 Click Score: 10    End of Session Equipment Utilized During Treatment: Gait belt;Oxygen Activity Tolerance: Patient limited by fatigue Patient left: in bed;with bed alarm set;with SCD's reapplied Nurse Communication: Mobility status PT Visit Diagnosis: Muscle weakness (generalized) (M62.81);Difficulty in walking, not elsewhere classified (R26.2);Unsteadiness on feet (R26.81)    Time:  9211-9417 PT Time Calculation (min) (ACUTE ONLY): 27 min   Charges:   PT Evaluation $PT Eval Moderate Complexity: 1 Mod PT Treatments $Therapeutic Exercise: 8-22 mins        Greggory Stallion, PT, DPT [phone]   [name] 05/21/2021, 1:00 PM

## 2021-05-21 NOTE — Plan of Care (Signed)

## 2021-05-21 NOTE — Progress Notes (Signed)
Progress Note  Patient Name: Anita [name] Date of Encounter: 05/21/2021  Primary Cardiologist: Harrell Gave  Subjective   Feels like her breathing is improving, not back to baseline. No chest pain or palpitations. Swelling persists. Remains on supplemental oxygen at 2 L via nasal cannula. She is uncertain what her dry weight should be. Documented UOP 2.5 L for the past 24 hours and net - 2.8 L for the admission. Uncertain accuracy of weight trend with an admission weight of 69 kg and a weight this morning of 86.7 kg trending to 78.5 kg 5 hours later. Renal function and CO2 trending up.     Inpatient Medications    Scheduled Meds:  aspirin EC  81 mg Oral Daily   carvedilol  3.125 mg Oral BID WC   furosemide  60 mg Intravenous BID   losartan  12.5 mg Oral Daily   potassium chloride  30 mEq Oral BID   rosuvastatin  20 mg Oral Daily   sodium chloride flush  3 mL Intravenous Q12H   Continuous Infusions:  sodium chloride     PRN Meds: sodium chloride, acetaminophen **OR** acetaminophen, albuterol, HYDROcodone-acetaminophen, nitroGLYCERIN, ondansetron **OR** ondansetron (ZOFRAN) IV, senna-docusate, sodium chloride flush   Vital Signs    Vitals:   05/21/21 0858 05/21/21 0908 05/21/21 0953 05/21/21 1122  BP: 132/84 132/90  114/76  Pulse: 65 70  71  Resp: 18   18  Temp: (!) 97.4 F (36.3 C)   97.7 F (36.5 C)  TempSrc:      SpO2: 100%   94%  Weight:   78.5 kg     Intake/Output Summary (Last 24 hours) at 05/21/2021 1154 Last data filed at 05/21/2021 0919 Gross per 24 hour  Intake 963 ml  Output 3615 ml  Net -2652 ml    Filed Weights   05/20/21 0317 05/21/21 0431 05/21/21 0953  Weight: 83.1 kg 86.7 kg 78.5 kg    Telemetry    SR - Personally Reviewed  ECG    No new tracings - Personally Reviewed  Physical Exam   GEN: No acute distress.   Neck: JVD elevated to the angle of the mandible. Cardiac: RRR, II/VI systolic murmur at the apex, no rubs, or  gallops.  Respiratory: Diminished breath sounds bilaterally with diffuse crackles.  GI: Soft, nontender, mildly distended.   MS: 1-2+ bilateral lower extremity pitting edema to the sacrum; upper extremity swelling. No deformity. Neuro:  Alert and oriented x 3; Nonfocal.  Psych: Normal affect.  Labs    Chemistry Recent Labs  Lab 05/18/21 2205 05/20/21 0427 05/21/21 0513  NA 130* 132* 133*  K 4.0 3.6 3.8  CL 95* 95* 90*  CO2 26 29 36*  GLUCOSE 93 76 89  BUN 26* 30* 29*  CREATININE 0.95 0.99 1.26*  CALCIUM 9.0 8.4* 8.6*  PROT 6.6  --   --   ALBUMIN 3.4*  --   --   AST 37  --   --   ALT 16  --   --   ALKPHOS 94  --   --   BILITOT 1.7*  --   --   GFRNONAA >60 >60 49*  ANIONGAP 9 8 7       Hematology Recent Labs  Lab 05/18/21 2205 05/20/21 0427 05/21/21 0513  WBC 11.7* 8.3 8.5  RBC 3.74* 3.44* 3.74*  HGB 10.6* 9.8* 10.6*  HCT 33.1* 31.2* 34.3*  MCV 88.5 90.7 91.7  MCH 28.3 28.5 28.3  MCHC 32.0 31.4  30.9  RDW 16.3* 16.5* 16.9*  PLT 310 224 270     Cardiac EnzymesNo results for input(s): TROPONINI in the last 168 hours. No results for input(s): TROPIPOC in the last 168 hours.   BNP Recent Labs  Lab 05/18/21 2205  BNP 4,147.9*      DDimer No results for input(s): DDIMER in the last 168 hours.   Radiology    US Venous Img Upper Uni Left  Result Date: 05/18/2021 IMPRESSION: No evidence of DVT within the left upper extremity. Diffuse subcutaneous edema. Electronically Signed   By: Dorise Bullion III M.D   On: 05/18/2021 23:58   DG Chest Port 1 View  Result Date: 05/18/2021 IMPRESSION: Bilateral airspace disease with cardiomegaly and probable layering effusions. Findings could reflect edema or infection. Electronically Signed   By: Rolm Baptise M.D.   On: 05/18/2021 22:03    Cardiac Studies   2D echo 03/20/2020: 1. Left ventricular ejection fraction, by estimation, is 40 to 45%. The  left ventricle has mildly decreased function. The left ventricle   demonstrates global hypokinesis. The left ventricular internal cavity size  was mildly dilated. Left ventricular  diastolic function could not be evaluated.   2. Right ventricular systolic function is normal. The right ventricular  size is normal. There is moderately elevated pulmonary artery systolic  pressure.   3. Left atrial size was mildly dilated.   4. Moderate mitral subvalvular thickening/fibrosis.   5. The mitral valve is rheumatic. Moderate to severe mitral valve  regurgitation. Mild mitral stenosis. The mean mitral valve gradient is 7.3  mmHg with average heart rate of 105 bpm.   6. The aortic valve is normal in structure. Aortic valve regurgitation is  not visualized.   7. The inferior vena cava is dilated in size with <50% respiratory  variability, suggesting right atrial pressure of 15 mmHg.   Conclusion(s)/Recommendation(s): Consider TEE for better mitral  regurgitation evaluation.  __________  [facility] 03/23/2020:  Hemodynamics: LV end diastolic pressure is moderately elevated. ----Coronary Angiography----- Ost RCA to Dist RCA lesion is 100% stenosed.-The PDA and PL system fills via faint collaterals from the AV groove LCx and LAD septals. Mid LAD-1 lesion is 60% stenosed. Mid LAD-2 lesion is 50% stenosed. Dist LAD lesion is 55% stenosed with 70% stenosed side branch in 2nd Diag. 1st Diag lesion is 70% stenosed. Ost Cx to Prox Cx lesion is 45% stenosed. Prox-MID Cx lesion is 65% stenosed.   MODERATE-SEVERE THREE-VESSEL CAD: 100% proximal RCA occlusion with left-to-right collaterals faintly filling PDA and PL system (AV groove LCx-PL and LAD septal-PDA) Diffuse moderate mid LAD disease with 60% to 50% stenosis. Tandem 50% and 65% proximal and mid LCx with the 65% lesion being the most significant lesion. Moderately elevated LVEDP of 18-20 mmHg   Given the extent of disease in the LAD and LCx, neither lesion is very amenable to PCI, and RCA is chronically occluded.  Given  her comorbidities, I do not think she would be a good CABG candidate especially in light of the fact that she would likely require valve surgery as well.  She would not recover well.   Recommendations aggressive medical management. __________  2D echo 02/07/2021: 1. Left ventricular ejection fraction, by estimation, is 40 to 45%. Left  ventricular ejection fraction by 3D volume is 47 %. The left ventricle has  mildly decreased function. The left ventricle demonstrates global  hypokinesis. Left ventricular diastolic   parameters are consistent with Grade II diastolic dysfunction  (pseudonormalization).  Elevated left ventricular end-diastolic pressure.   2. Right ventricular systolic function is severely reduced. The right  ventricular size is mildly enlarged. There is severely elevated pulmonary  artery systolic pressure. The estimated right ventricular systolic  pressure is 70.2 mmHg.   3. Left atrial size was moderately dilated.   4. Right atrial size was severely dilated.   5. The mitral valve is degenerative with mild to moderate thickening of  the mitral valve leaflets. There is teathering of the MV leaflets due to  mild LV dysfunction. Severe mitral valve regurgitation. Mild to moderate  mitral stenosis. The mean mitral  valve gradient is 6.5 mmHg.   6. Tricuspid valve regurgitation is moderate to severe.   7. The aortic valve is tricuspid. Aortic valve regurgitation is mild.  Mild aortic valve sclerosis is present, with no evidence of aortic valve  stenosis. Aortic regurgitation PHT measures 499 msec.   8. The inferior vena cava is dilated in size with <50% respiratory  variability, suggesting right atrial pressure of 15 mmHg.   9. There is a trivial pericardial effusion posterior to the left  ventricle.  10. Compared to prior echo, Mitral regurgitation appears worse and RV  dysfunction is now present. There is severe Pulmonary HTN.    Patient Profile     60 y.o. female with  history of multivessel CAD medically managed as above, chronic combined systolic and diastolic CHF secondary to ICM, moderate to severe mitral regurgitation with rheumatic appearing mitral valve without severe mitral stenosis, pulmonary hypertension, MS, COPD, anemia, prior tobacco use and chronic dyspnea who was admitted with acute on chronic combined systolic and diastolic CHF.   Assessment & Plan    1. Acute hypoxic respiratory failure: -Multifactorial including acute on chronic combined CHF, pulmonary hypertension, moderate to severe mitral regurgitation, COPD, anemia, and physical deconditioning  -Wean supplemental oxygen as tolerated, remains on 2 L via nasal cannula  2. Acute on chronic combined systolic and diastolic CHF/ICM/pulmonary hypertension/anasarca: -She remains significantly volume up -Decrease IV Lasix to 60 mg daily along with decrease of KCl to 30 mEq daily -TED hose -Cannot exclude some degree of dependent edema  -Continue current GDMT including Coreg and losartan, look to escalate evidence-based therapy throughout her admission as vitals and labs allow  -May need a RHC pending symptom trend, UOP, and renal function  -Weight trend inaccurate -Recommend daily standing scale weights -Strict Ins and outs -CHF education   3. Moderate to severe mitral regurgitation: -Contributing to overall presentation  -Ideally, she would require mitral valve surgery, though has not been felt to be a candidate as outlined below -Consider TEE following diuresis, possibly outpatient with primary cardiologist   4. CAD involving the native coronary arteries with elevated HS-TN: -Minimal elevation in high sensitivity troponin that is flat trending and not consistent with ACS -Likely supply demand ischemia in the setting of known CTO of the RCA along with moderate disease involving the left coronary tree with volume overload  -Not felt to be a candidate for CABG due to underlying lung disease  and MS -PTA ASA and Crestor   5. COPD: -Per IM  6. Anemia: -Stable   7. AKI: -Decrease Lasix as above -Trend    For questions or updates, please contact Jeannette Please consult [email or web address] for contact info under Cardiology/STEMI.    Signed, Christell Faith, PA-C [facility] Pager: [phone] 05/21/2021, 11:54 AM

## 2021-05-21 NOTE — Evaluation (Signed)
Occupational Therapy Evaluation Patient Details Name: Anita [name] MRN: [id number] DOB: 08-Nov-1961 Today's Date: 05/21/2021    History of Present Illness Pt admitted for acute/chronic systolic and diastolic CHF with complaints of facial swelling and SOB symptoms x 3 weeks. History includes MS, CHF, COPD, and CAD.   Clinical Impression   Patient presenting with decreased I in self care, balance, functional mobility/transfers, endurance, and safety awareness.Patient reports living at home with daughter PTA. Her daughter works from home and pt reports she is able to ambulate and perform self care tasks at [facility] I level with RW/rollator. However, pt also endorses things have been more difficult and she has been needing increased assistance at home. Pt also reports she is mostly in bed at home because " I do not sleep at night so I am sleeping on and off during the day".Patient very limiting this session and refusing OOB and EOB tasks. She performed grooming tasks with set up A and needing max A for repositioning in bed. Patient will benefit from acute OT to increase overall independence in the areas of ADLs, functional mobility, and safety awareness in order to safely discharge to next venue of care.     Follow Up Recommendations  SNF    Equipment Recommendations  Other (comment) (defer to next venue of care)       Precautions / Restrictions Precautions Precautions: Fall Restrictions Weight Bearing Restrictions: No      Mobility Bed Mobility Overal bed mobility: Needs Assistance Bed Mobility: Rolling Rolling: Mod assist   Supine to sit: Mod assist;+2 for physical assistance     General bed mobility comments: heavy assist for trunkal elevation, mod assist for sliding B LEs off bed. ONce seated, needs mod assist for seated balance, improving with time to supervision    Transfers Overall transfer level: Needs assistance Equipment used: 2 person hand held assist Transfers: Sit  to/from Stand Sit to Stand: Mod assist;+2 physical assistance         General transfer comment: pt refusal    Balance Overall balance assessment: Needs assistance Sitting-balance support: Feet supported;Bilateral upper extremity supported Sitting balance-Leahy Scale: Fair     Standing balance support: Bilateral upper extremity supported Standing balance-Leahy Scale: Poor                             ADL either performed or assessed with clinical judgement   ADL Overall ADL's : Needs assistance/impaired     Grooming: Wash/dry hands;Wash/dry face;Brushing hair;Bed level;Set up Grooming Details (indicate cue type and reason): assist to obtain needed items                       Toileting - Clothing Manipulation Details (indicate cue type and reason): Pt refusing OOB and EOB             Vision Patient Visual Report: No change from baseline              Pertinent Vitals/Pain Pain Assessment: No/denies pain     Hand Dominance Right   Extremity/Trunk Assessment Upper Extremity Assessment Upper Extremity Assessment: Generalized weakness   Lower Extremity Assessment Lower Extremity Assessment: Generalized weakness       Communication Communication Communication: No difficulties   Cognition Arousal/Alertness: Awake/alert Behavior During Therapy: WFL for tasks assessed/performed Overall Cognitive Status: Within Functional Limits for tasks assessed  General Comments: Pt alert and oriented during session.   General Comments       Exercises Exercises: Other exercises Other Exercises Other Exercises: Supine ther-ex performed on B LE including B AP and QS. Attempted SLRs with difficulty. 10 reps performed with [place] expects to be discharged to:: Private residence Living Arrangements: Children Available Help at Discharge: Family;Available 24 hours/day Type of  Home: House Home Access: Stairs to enter [employer] of Steps: 1 Entrance Stairs-Rails: None Home Layout: One level     Bathroom Shower/Tub: Teacher, early years/pre: Handicapped height     Home Equipment: Environmental consultant - 4 wheels;Bedside commode          Prior Functioning/Environment Level of Independence: Needs assistance  Gait / Transfers Assistance Needed: Pt reports use of rollator for mobility and endorses 2 falls this year. Her daughter works from home. ADL's / Homemaking Assistance Needed: Daughter assists with IADL tasks. Pt reports getting into tub for bathing but needing some assistance. She reports independence with dress tasks.   Comments: Pt very vague for some answers but does endorse spending quite a large part of her day in bed secondary to B hip pain and being unable to sleep at night.        OT Problem List: Decreased strength;Decreased knowledge of use of DME or AE;Decreased activity tolerance;Impaired balance (sitting and/or standing);Decreased safety awareness;Cardiopulmonary status limiting activity      OT Treatment/Interventions: Self-care/ADL training;Manual therapy;Therapeutic exercise;Modalities;Patient/family education;Energy conservation;DME and/or AE instruction;Therapeutic activities;Balance training    OT Goals(Current goals can be found in the care plan section) Acute Rehab OT Goals Patient Stated Goal: to go home with daughter OT Goal Formulation: With patient Time For Goal Achievement: 06/04/21 Potential to Achieve Goals: Fair ADL Goals Pt Will Perform Grooming: standing;with min guard assist Pt Will Perform Lower Body Dressing: with min assist;sit to/from stand Pt Will Transfer to Toilet: with min assist;ambulating Pt Will Perform Toileting - Clothing Manipulation and hygiene: with min assist;sit to/from stand  OT Frequency: Min 2X/week   Barriers to D/C:    none known at this time          AM-PAC OT "6 Clicks" Daily  Activity     Outcome Measure Help from another person eating meals?: None Help from another person taking care of personal grooming?: A Little Help from another person toileting, which includes using toliet, bedpan, or urinal?: Total Help from another person bathing (including washing, rinsing, drying)?: A Lot Help from another person to put on and taking off regular upper body clothing?: A Little Help from another person to put on and taking off regular lower body clothing?: Total 6 Click Score: 14   End of Session Equipment Utilized During Treatment: Oxygen (1L) Nurse Communication: Mobility status  Activity Tolerance: Other (comment) (Pt limiting session secondary to refusal for OOB and EOB activities.) Patient left: in bed;with call bell/phone within reach  OT Visit Diagnosis: Unsteadiness on feet (R26.81);Muscle weakness (generalized) (M62.81);Repeated falls (R29.6);History of falling (Z91.81)                Time: 9326-7124 OT Time Calculation (min): 20 min Charges:  OT General Charges $OT Visit: 1 Visit OT Evaluation $OT Eval Moderate Complexity: 1 Mod OT Treatments $Therapeutic Activity: 8-22 mins  Darleen Crocker, MS, OTR/L , CBIS ascom [phone]  05/21/21, 4:01 PM

## 2021-05-22 LAB — BASIC METABOLIC PANEL
Anion gap: 8 (ref 5–15)
BUN: 26 mg/dL — ABNORMAL HIGH (ref 6–20)
CO2: 35 mmol/L — ABNORMAL HIGH (ref 22–32)
Calcium: 8.6 mg/dL — ABNORMAL LOW (ref 8.9–10.3)
Chloride: 92 mmol/L — ABNORMAL LOW (ref 98–111)
Creatinine, Ser: 1.05 mg/dL — ABNORMAL HIGH (ref 0.44–1.00)
GFR, Estimated: >60 mL/min (ref >60)
Glucose, Bld: 88 mg/dL (ref 70–99)
Potassium: 4.3 mmol/L (ref 3.5–5.1)
Sodium: 135 mmol/L (ref 135–145)

## 2021-05-22 IMAGING — DX DG CHEST 2V
2 series · 2 of 2 positions shown · non-contrast
Comparison: Sunday March, 2020 and evaluation with CT and prior study
from Sunday June, 2020 with radiograph.

CLINICAL DATA: Increased DISI in the setting of CHF and COPD

EXAM:
CHEST - 2 VIEW

[chest pa]
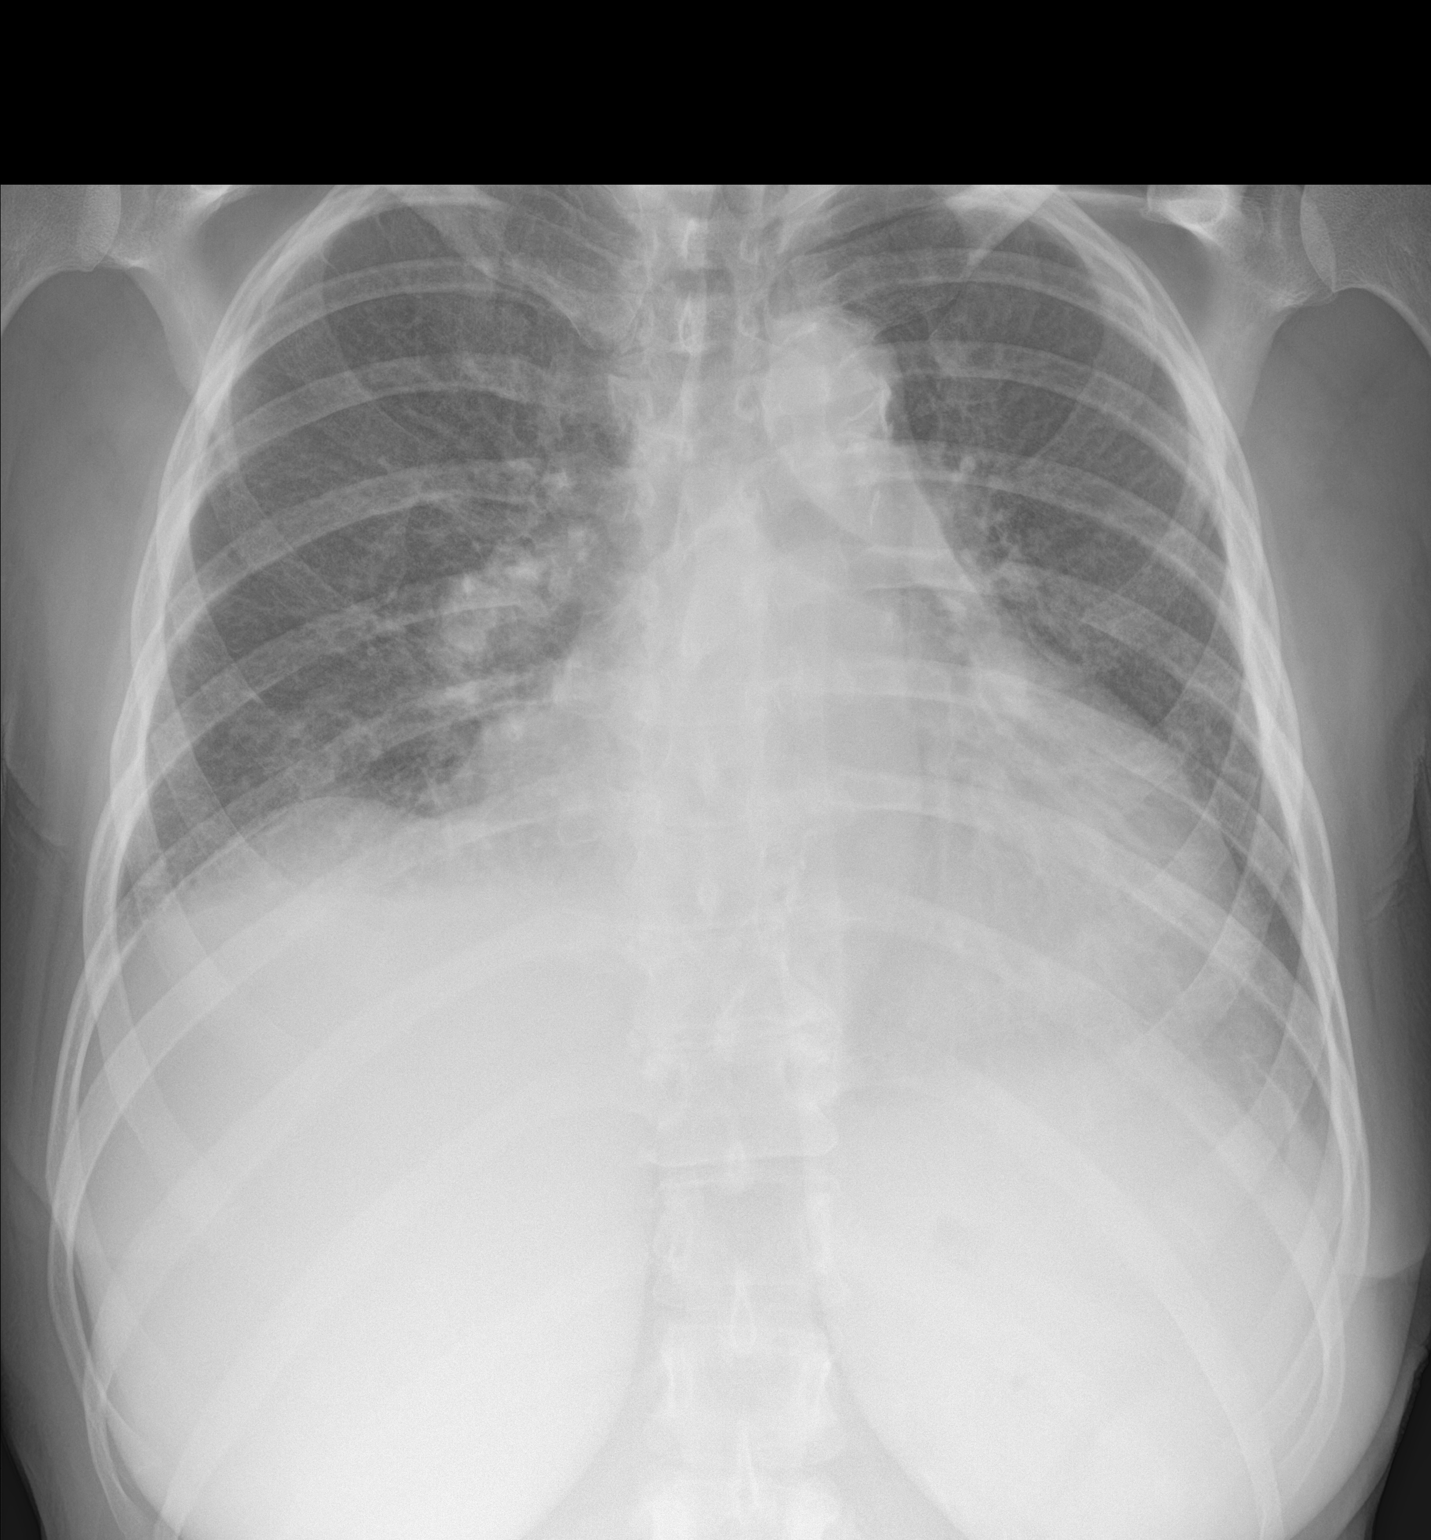

[chest lat]
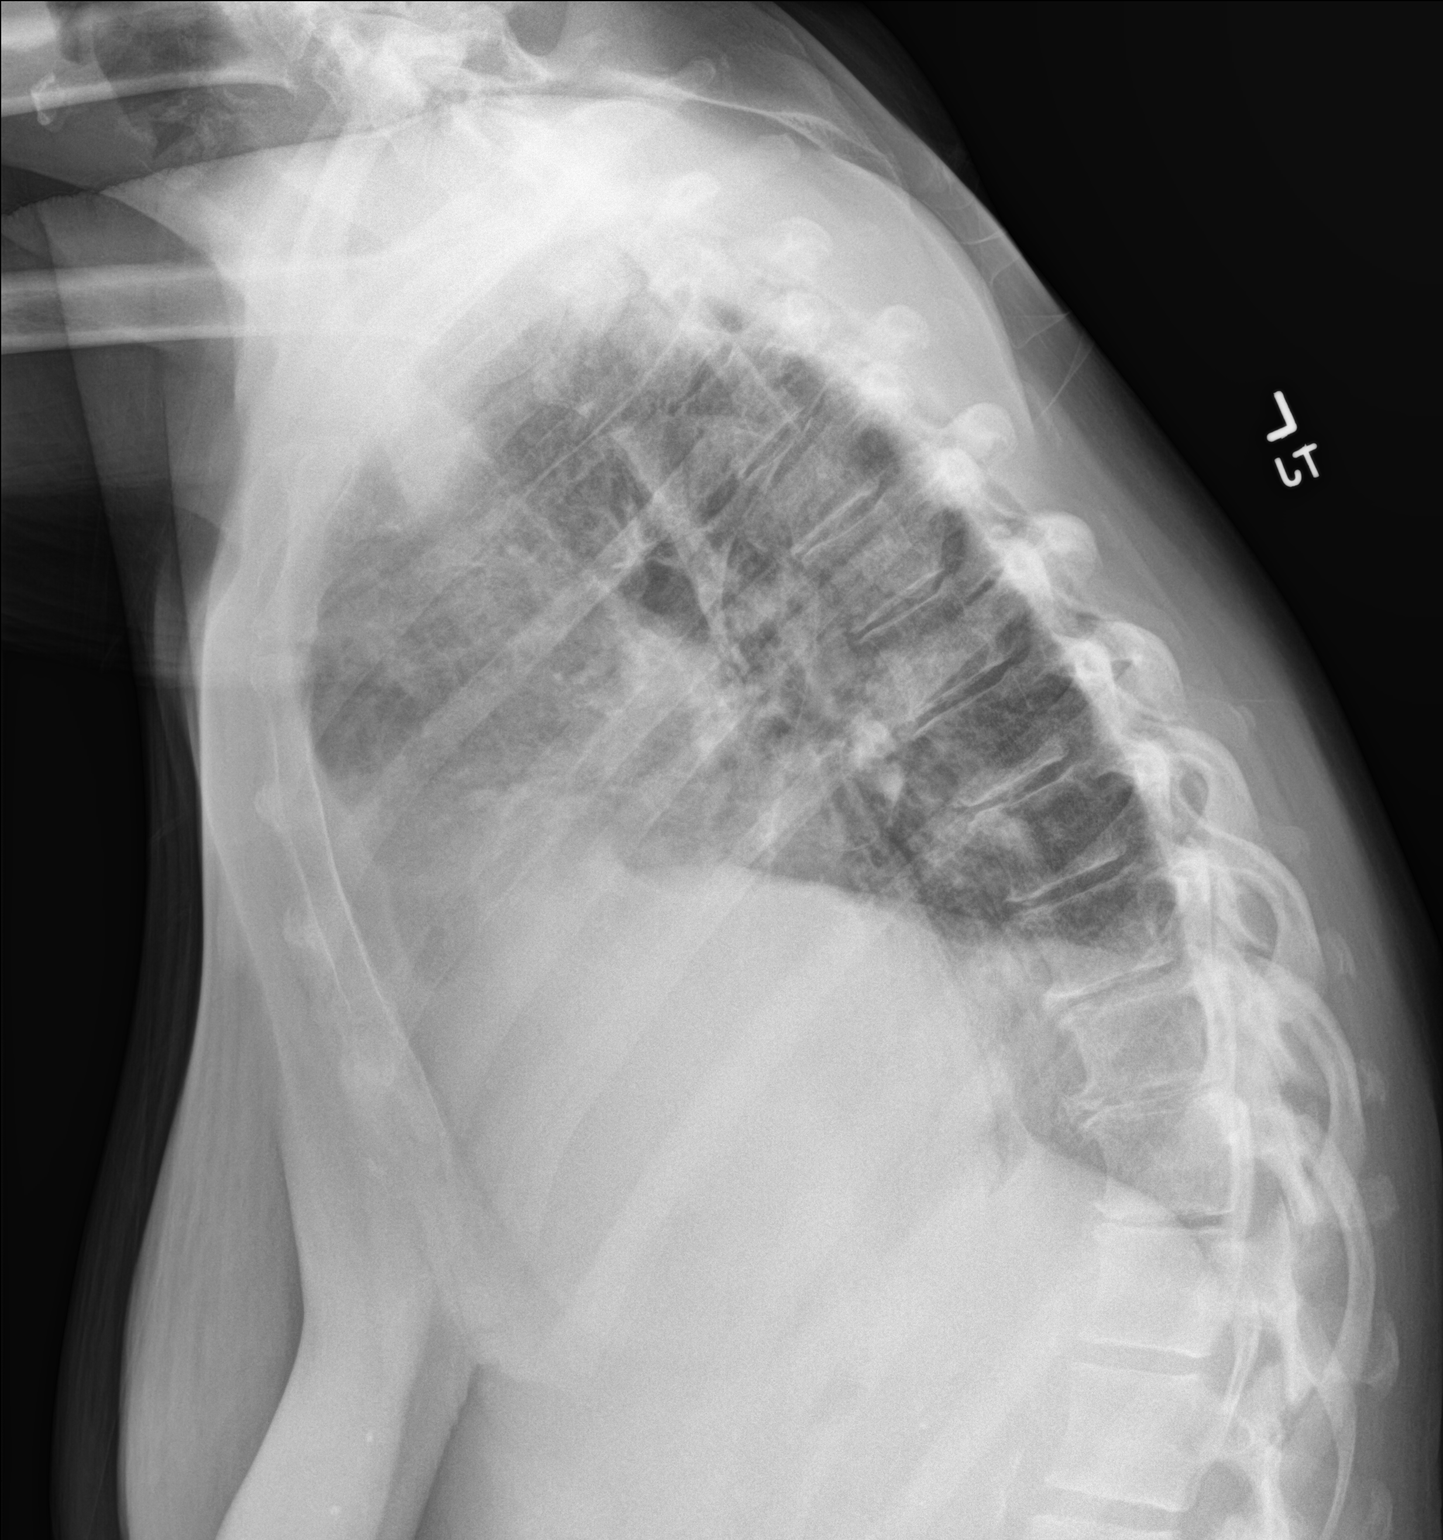

[2 of 2 positions shown; findings below may reference images not displayed]

FINDINGS: Lung volumes are diminished when compared to the previous exam.
Accounting for this cardiomediastinal contour shows stable cardiac
enlargement. Hilar structures are stable.

Increased interstitial markings without effusion. Lateral view
limited by obliquity. Subtle RIGHT middle lobe opacity suspected
when correlated frontal lateral views.

No lobar consolidative changes or visible pneumothorax. Loss of LEFT
hemidiaphragmatic contour also noted without dense retrocardiac
consolidation.

On limited assessment no acute skeletal process.
IMPRESSION: 1. Findings suspicious for developing infection in the RIGHT middle
lobe versus is asymmetric edema.
2. Cardiomegaly with signs of heart failure and probable
interstitial pulmonary edema.

## 2021-05-22 MED ORDER — FUROSEMIDE 10 MG/ML IJ SOLN
60.0000 mg | Freq: Two times a day (BID) | INTRAMUSCULAR | Status: DC
  Administered 2021-05-22 – 2021-05-23 (×3): 60 mg via INTRAVENOUS
  Filled 2021-05-22 (×3): qty 6

## 2021-05-22 NOTE — Progress Notes (Signed)
PROGRESS NOTE    Anita [name]  YKZ:[id number] DOB: Aug 28, 1961 DOA: 05/18/2021 PCP: [name], NP    Brief Narrative:  60 year old female with history of multiple sclerosis, COPD, CAD, chronic combined CHF, last EF 40 to 45% 02/2021, ischemic cardiomyopathy, severe mitral regurgitation with limited surgical options, severe pulmonary hypertension, previously on home O2, HTN, ex smoker, presenting with anasarca.    Patient has been diuresing effectively on Lasix 40 mg IV twice daily.  She remains significantly anasarcic.  Diuresing well.  4 L net negative over admission   Assessment & Plan:   Principal Problem:   Acute on chronic combined systolic and diastolic CHF (congestive heart failure) (HCC) Active Problems:   Multiple sclerosis (HCC)   Essential hypertension   Pulmonary hypertension (HCC)   COPD (chronic obstructive pulmonary disease) (HCC)   Ischemic cardiomyopathy   Coronary artery disease involving native coronary artery of native heart without angina pectoris   Mitral valve insufficiency   UTI (urinary tract infection)   CHF (congestive heart failure) (HCC)   Hypoxia   Anasarca  Anasarca   Acute hypoxic respiratory failure due to CHF exacerbation   Acute on chronic combined systolic and diastolic CHF   Ischemic cardiomyopathy   Severe mitral regurgitation - Last echo on file from March 2022 with grade 2 diastolic dysfunction EF 40 to 45% with severe mitral regurg - On admission BNP 4147 with troponin trending down, EKG nonacute and no chest pain - Suspect related to severe mitral regurg as well as severe pulmonary hypertension  Plan: Continue IV Lasix 60 mg twice daily today Depending on kidney function and urine output consider transition to oral diuretic tomorrow     Multiple sclerosis (HCC) - stable     Essential hypertension - Continue Coreg and losartan   Hypoxia Pulmonary hypertension (HCC) COPD (chronic obstructive pulmonary disease)  (HCC) - O2 sat 89% on room air requiring 2 L O2 - Patient was previously on home O2, not currently.  May need home O2 eval prior to discharge. will need oxygen qualification - DuoNebs as needed   CAD - Troponin trending down with nonacute EKG and no chest pain - Continue aspirin, rosuvastatin and Coreg   Possible UTI (urinary tract infection) - Urinalysis with large LE and many bacteria but no urinary symptoms or fever.  No leukocytosis - It appears no culture was sent on the original UA -Patient not having any urinary symptoms -No negation for antibiotics at this time   DVT prophylaxis: SQ Lovenox Code Status: Full Family Communication: none today Disposition Plan: Status is: Inpatient  Remains inpatient appropriate because:Inpatient level of care appropriate due to severity of illness  Dispo: The patient is from: Home              Anticipated d/c is to: SNF              Patient currently is not medically stable to d/c.   Difficult to place patient No  Possibly 1 additional day of IV diuresis.  Discharge planning from tomorrow     Level of care: Progressive Cardiac  Consultants:  Cardiology  Procedures:  None  Antimicrobials:  None   Subjective: Seen and examined.  Lying comfortably in bed.  No visible distress.  No pain complaints.  Objective: Vitals:   05/22/21 0128 05/22/21 0401 05/22/21 0722 05/22/21 1123  BP:  113/78 131/86 121/71  Pulse:  65 67 75  Resp:  16 17 17   Temp:  97.7 F (  36.5 C) 98.1 F (36.7 C) 97.9 F (36.6 C)  TempSrc:      SpO2:  99% 99% 98%  Weight: 82.1 kg     Height:        Intake/Output Summary (Last 24 hours) at 05/22/2021 1449 Last data filed at 05/22/2021 1015 Gross per 24 hour  Intake 420 ml  Output 750 ml  Net -330 ml   Filed Weights   05/21/21 0953 05/21/21 2027 05/22/21 0128  Weight: 78.5 kg 78 kg 82.1 kg    Examination:  General exam: No apparent distress Respiratory system: Lungs clear.  Normal work of  breathing.  Room air Cardiovascular system: O3-Z8, systolic murmur, 2+ pitting edema bilaterally Gastrointestinal system: Abdomen is nondistended, soft and nontender. No organomegaly or masses felt. Normal bowel sounds heard. Central nervous system: Alert and oriented. No focal neurological deficits. Extremities: Symmetric 5 x 5 power. Skin: No rashes, lesions or ulcers Psychiatry: Judgement and insight appear normal. Mood & affect appropriate.     Data Reviewed: I have personally reviewed following labs and imaging studies  CBC: Recent Labs  Lab 05/18/21 2205 05/20/21 0427 05/21/21 0513  WBC 11.7* 8.3 8.5  NEUTROABS 10.1*  --   --   HGB 10.6* 9.8* 10.6*  HCT 33.1* 31.2* 34.3*  MCV 88.5 90.7 91.7  PLT 310 224 588   Basic Metabolic Panel: Recent Labs  Lab 05/18/21 2205 05/20/21 0427 05/21/21 0513 05/22/21 0618  NA 130* 132* 133* 135  K 4.0 3.6 3.8 4.3  CL 95* 95* 90* 92*  CO2 26 29 36* 35*  GLUCOSE 93 76 89 88  BUN 26* 30* 29* 26*  CREATININE 0.95 0.99 1.26* 1.05*  CALCIUM 9.0 8.4* 8.6* 8.6*   GFR: Estimated Creatinine Clearance: 59.3 mL/min (A) (by C-G formula based on SCr of 1.05 mg/dL (H)). Liver Function Tests: Recent Labs  Lab 05/18/21 2205  AST 37  ALT 16  ALKPHOS 94  BILITOT 1.7*  PROT 6.6  ALBUMIN 3.4*   No results for input(s): LIPASE, AMYLASE in the last 168 hours. No results for input(s): AMMONIA in the last 168 hours. Coagulation Profile: No results for input(s): INR, PROTIME in the last 168 hours. Cardiac Enzymes: No results for input(s): CKTOTAL, CKMB, CKMBINDEX, TROPONINI in the last 168 hours. BNP (last 3 results) No results for input(s): PROBNP in the last 8760 hours. HbA1C: No results for input(s): HGBA1C in the last 72 hours. CBG: Recent Labs  Lab 05/19/21 2254  GLUCAP 100*   Lipid Profile: No results for input(s): CHOL, HDL, LDLCALC, TRIG, CHOLHDL, LDLDIRECT in the last 72 hours. Thyroid Function Tests: No results for  input(s): TSH, T4TOTAL, FREET4, T3FREE, THYROIDAB in the last 72 hours. Anemia Panel: No results for input(s): VITAMINB12, FOLATE, FERRITIN, TIBC, IRON, RETICCTPCT in the last 72 hours. Sepsis Labs: Recent Labs  Lab 05/18/21 2346  LATICACIDVEN 1.8    Recent Results (from the past 240 hour(s))  Resp Panel by RT-PCR (Flu A&B, Covid) Nasopharyngeal Swab     Status: None   Collection Time: 05/18/21 10:05 PM   Specimen: Nasopharyngeal Swab; Nasopharyngeal(NP) swabs in vial transport medium  Result Value Ref Range Status   SARS Coronavirus 2 by RT PCR NEGATIVE NEGATIVE Final    Comment: (NOTE) SARS-CoV-2 target nucleic acids are NOT DETECTED.  The SARS-CoV-2 RNA is generally detectable in upper respiratory specimens during the acute phase of infection. The lowest concentration of SARS-CoV-2 viral copies this assay can detect is 138 copies/mL. A negative result does  not preclude SARS-Cov-2 infection and should not be used as the sole basis for treatment or other patient management decisions. A negative result may occur with  improper specimen collection/handling, submission of specimen other than nasopharyngeal swab, presence of viral mutation(s) within the areas targeted by this assay, and inadequate number of viral copies(<138 copies/mL). A negative result must be combined with clinical observations, patient history, and epidemiological information. The expected result is Negative.  Fact Sheet for Patients:  EntrepreneurPulse.com.au  Fact Sheet for Healthcare Providers:  IncredibleEmployment.be  This test is no t yet approved or cleared by the [place] FDA and  has been authorized for detection and/or diagnosis of SARS-CoV-2 by FDA under an Emergency Use Authorization (EUA). This EUA will remain  in effect (meaning this test can be used) for the duration of the COVID-19 declaration under Section 564(b)(1) of the Act, 21 U.S.C.section  360bbb-3(b)(1), unless the authorization is terminated  or revoked sooner.       Influenza A by PCR NEGATIVE NEGATIVE Final   Influenza B by PCR NEGATIVE NEGATIVE Final    Comment: (NOTE) The Xpert Xpress SARS-CoV-2/FLU/RSV plus assay is intended as an aid in the diagnosis of influenza from Nasopharyngeal swab specimens and should not be used as a sole basis for treatment. Nasal washings and aspirates are unacceptable for Xpert Xpress SARS-CoV-2/FLU/RSV testing.  Fact Sheet for Patients: EntrepreneurPulse.com.au  Fact Sheet for Healthcare Providers: IncredibleEmployment.be  This test is not yet approved or cleared by the [place] FDA and has been authorized for detection and/or diagnosis of SARS-CoV-2 by FDA under an Emergency Use Authorization (EUA). This EUA will remain in effect (meaning this test can be used) for the duration of the COVID-19 declaration under Section 564(b)(1) of the Act, 21 U.S.C. section 360bbb-3(b)(1), unless the authorization is terminated or revoked.  Performed at [facility], [address] [place], [place] [address]          Radiology Studies: No results found.      Scheduled Meds:  aspirin EC  81 mg Oral Daily   carvedilol  3.125 mg Oral BID WC   enoxaparin (LOVENOX) injection  40 mg Subcutaneous Q24H   furosemide  60 mg Intravenous Q12H   losartan  12.5 mg Oral Daily   potassium chloride  30 mEq Oral Daily   rosuvastatin  20 mg Oral Daily   sodium chloride flush  3 mL Intravenous Q12H   Continuous Infusions:  sodium chloride       LOS: 3 days    Time spent: 25 minutes    Sidney Ace, MD [facility] Pager [phone]  If 7PM-7AM, please contact night-coverage 05/22/2021, 2:49 PM

## 2021-05-22 NOTE — Progress Notes (Addendum)
   Heart Failure Nurse Navigator Note  HFmrEF 40-45%, severe mitral regurgitation.   Had attempted twice to see patient but each time she was asleep.  Called and spoke with daughter [name].  She states that they currently do not have a working scale and when it was working they would just wait her a couple times a week.  Discussed the importance of weighing daily and recording and notifying physician of 2 pound weight gain overnight or 5 pounds within 1 week.  She states that she had noted the swelling and tried to get on top of that with the use of the torsemide.    She states lately due to her mother having swallowing issues and vomiting that she had been eating soft cakes and been drinking a can of boost daily.  Discussing her mother's fluid intake he states that she drinks approximately 3 glasses of sweet tea daily, each glass approximately 12 ounces in size.  In addition to that also has 1 can of boost.  She states that her mother had been drinking Pepsi but she was able to get her off  and that she gags on water.  Again reinforced weighing daily and recording and keeping on top of any weight gain, increased swelling.   Contacted TOC for supplying the patient with a scale.  Pricilla Riffle RN [facility]

## 2021-05-22 NOTE — Progress Notes (Signed)
Progress Note  Patient Name: Anita [name] Date of Encounter: 05/22/2021  [facility] Cardiologist: Buford Dresser, MD   Subjective   Shortness of breath much improved, denies chest pain.  Needs help with eating breakfast.  No acute events overnight.  Net -1.1 L over the past 24 hours.  Inpatient Medications    Scheduled Meds:  aspirin EC  81 mg Oral Daily   carvedilol  3.125 mg Oral BID WC   enoxaparin (LOVENOX) injection  40 mg Subcutaneous Q24H   furosemide  60 mg Intravenous Daily   losartan  12.5 mg Oral Daily   potassium chloride  30 mEq Oral Daily   rosuvastatin  20 mg Oral Daily   sodium chloride flush  3 mL Intravenous Q12H   Continuous Infusions:  sodium chloride     PRN Meds: sodium chloride, acetaminophen **OR** acetaminophen, albuterol, HYDROcodone-acetaminophen, nitroGLYCERIN, ondansetron **OR** ondansetron (ZOFRAN) IV, senna-docusate, sodium chloride flush   Vital Signs    Vitals:   05/22/21 0128 05/22/21 0401 05/22/21 0722 05/22/21 1123  BP:  113/78 131/86 121/71  Pulse:  65 67 75  Resp:  16 17 17   Temp:  97.7 F (36.5 C) 98.1 F (36.7 C) 97.9 F (36.6 C)  TempSrc:      SpO2:  99% 99% 98%  Weight: 82.1 kg     Height:        Intake/Output Summary (Last 24 hours) at 05/22/2021 1126 Last data filed at 05/22/2021 1015 Gross per 24 hour  Intake 420 ml  Output 1650 ml  Net -1230 ml   Last 3 Weights 05/22/2021 05/21/2021 05/21/2021  Weight (lbs) 181 lb 171 lb 15.3 oz 173 lb 1.6 oz  Weight (kg) 82.101 kg 78 kg 78.518 kg      Telemetry    Sinus rhythm- Personally Reviewed  ECG    No new tracing reviewed- Personally Reviewed  Physical Exam   GEN: No acute distress.   Neck: No JVD Cardiac: RRR, 2/6 systolic murmur Respiratory: Decreased breath sounds at bases GI: Soft, nontender, non-distended  MS: 1+ edema; No deformity. Neuro:  Nonfocal  Psych: Normal affect   Labs    High Sensitivity Troponin:   Recent Labs  Lab  05/18/21 2205 05/18/21 2356  TROPONINIHS 20* 18*      Chemistry Recent Labs  Lab 05/18/21 2205 05/20/21 0427 05/21/21 0513 05/22/21 0618  NA 130* 132* 133* 135  K 4.0 3.6 3.8 4.3  CL 95* 95* 90* 92*  CO2 26 29 36* 35*  GLUCOSE 93 76 89 88  BUN 26* 30* 29* 26*  CREATININE 0.95 0.99 1.26* 1.05*  CALCIUM 9.0 8.4* 8.6* 8.6*  PROT 6.6  --   --   --   ALBUMIN 3.4*  --   --   --   AST 37  --   --   --   ALT 16  --   --   --   ALKPHOS 94  --   --   --   BILITOT 1.7*  --   --   --   GFRNONAA >60 >60 49* >60  ANIONGAP 9 8 7 8      Hematology Recent Labs  Lab 05/18/21 2205 05/20/21 0427 05/21/21 0513  WBC 11.7* 8.3 8.5  RBC 3.74* 3.44* 3.74*  HGB 10.6* 9.8* 10.6*  HCT 33.1* 31.2* 34.3*  MCV 88.5 90.7 91.7  MCH 28.3 28.5 28.3  MCHC 32.0 31.4 30.9  RDW 16.3* 16.5* 16.9*  PLT 310 224 270  BNP Recent Labs  Lab 05/18/21 2205  BNP 4,147.9*     DDimer No results for input(s): DDIMER in the last 168 hours.   Radiology    No results found.  Cardiac Studies   echo 03/20/2020: 1. Left ventricular ejection fraction, by estimation, is 40 to 45%. The  left ventricle has mildly decreased function. The left ventricle  demonstrates global hypokinesis. The left ventricular internal cavity size  was mildly dilated. Left ventricular  diastolic function could not be evaluated.   2. Right ventricular systolic function is normal. The right ventricular  size is normal. There is moderately elevated pulmonary artery systolic  pressure.   3. Left atrial size was mildly dilated.   4. Moderate mitral subvalvular thickening/fibrosis.   5. The mitral valve is rheumatic. Moderate to severe mitral valve  regurgitation. Mild mitral stenosis. The mean mitral valve gradient is 7.3  mmHg with average heart rate of 105 bpm.   6. The aortic valve is normal in structure. Aortic valve regurgitation is  not visualized.   7. The inferior vena cava is dilated in size with <50% respiratory   variability, suggesting right atrial pressure of 15 mmHg.  Patient Profile     60 y.o. female with history of MS, CAD (CTO RCA, moderate mLAD, moderate LCx), ischemic cardiomyopathy, EF 40 to 45%, severe MR, COPD presenting with 3-week worsening shortness of breath, lower extremity swelling.   Assessment & Plan    1.  Cardiomyopathy EF 40-45%, -Edema much improved.  Creatinine normal. -Continue IV Lasix 60 mg twice daily at least 1 more day. -Consider p.o. meds starting tomorrow.  Will need higher oral doses compared to prior -PTA Coreg, losartan     2.  Severe MR  -Not a surgical or procedural/Mitraclip candidate  -Management with diuretics as above     3.  CAD  -no chest pain  -PTA aspirin, Crestor     Greater than 50% was spent in counseling and coordination of care with patient  Total encounter time 35 minutes or more       Signed, Kate Sable, MD  05/22/2021, 11:26 AM

## 2021-05-22 NOTE — Care Management Important Message (Signed)
Important Message  Patient Details  Name: Anita [name] MRN: [id number] Date of Birth: 1961-04-13   Medicare Important Message Given:  Yes     Dannette Barbara 05/22/2021, 10:59 AM

## 2021-05-23 DIAGNOSIS — I051 Rheumatic mitral insufficiency: Secondary | ICD-10-CM

## 2021-05-23 LAB — BASIC METABOLIC PANEL
Anion gap: 9 (ref 5–15)
BUN: 24 mg/dL — ABNORMAL HIGH (ref 6–20)
CO2: 34 mmol/L — ABNORMAL HIGH (ref 22–32)
Calcium: 8.4 mg/dL — ABNORMAL LOW (ref 8.9–10.3)
Chloride: 89 mmol/L — ABNORMAL LOW (ref 98–111)
Creatinine, Ser: 1.03 mg/dL — ABNORMAL HIGH (ref 0.44–1.00)
GFR, Estimated: >60 mL/min (ref >60)
Glucose, Bld: 92 mg/dL (ref 70–99)
Potassium: 3.9 mmol/L (ref 3.5–5.1)
Sodium: 132 mmol/L — ABNORMAL LOW (ref 135–145)

## 2021-05-23 NOTE — Progress Notes (Signed)
Progress Note  Patient Name: Anita [name] Date of Encounter: 05/23/2021  Primary Cardiologist: Harrell Gave  Subjective   Feels like her breathing is a little improved. No chest pain. Documented UOP 1.5 L for the past 24 hours with a neg - 5.7 L for the admission. Weight 82.1-->83.1 kg over the past 24 hours. Renal function overall stable.   Inpatient Medications    Scheduled Meds:  aspirin EC  81 mg Oral Daily   carvedilol  3.125 mg Oral BID WC   enoxaparin (LOVENOX) injection  40 mg Subcutaneous Q24H   furosemide  60 mg Intravenous Q12H   losartan  12.5 mg Oral Daily   potassium chloride  30 mEq Oral Daily   rosuvastatin  20 mg Oral Daily   sodium chloride flush  3 mL Intravenous Q12H   Continuous Infusions:  sodium chloride     PRN Meds: sodium chloride, acetaminophen **OR** acetaminophen, albuterol, HYDROcodone-acetaminophen, nitroGLYCERIN, ondansetron **OR** ondansetron (ZOFRAN) IV, senna-docusate, sodium chloride flush   Vital Signs    Vitals:   05/23/21 0114 05/23/21 0123 05/23/21 0337 05/23/21 0748  BP: 133/87  121/76 (!) 143/82  Pulse: 71  66 69  Resp: 19  20 18   Temp: 98.3 F (36.8 C)  98.4 F (36.9 C) 97.9 F (36.6 C)  TempSrc: Oral     SpO2: 100%  100% 100%  Weight:  83.1 kg    Height:        Intake/Output Summary (Last 24 hours) at 05/23/2021 0836 Last data filed at 05/23/2021 0748 Gross per 24 hour  Intake 340 ml  Output 1950 ml  Net -1610 ml    Filed Weights   05/21/21 2027 05/22/21 0128 05/23/21 0123  Weight: 78 kg 82.1 kg 83.1 kg    Telemetry    SR - Personally Reviewed  ECG    No new tracings - Personally Reviewed  Physical Exam   GEN: No acute distress.   Neck: JVD elevated to the angle of the mandible. Cardiac: RRR, II/VI systolic murmur at the apex, no rubs, or gallops.  Respiratory: Diminished breath sounds bilaterally with diffuse crackles.  GI: Soft, nontender, mildly distended.   MS: 2+ bilateral lower  extremity pitting edema to the sacrum; upper extremity swelling. No deformity. Neuro:  Alert and oriented x 3; Nonfocal.  Psych: Normal affect.  Labs    Chemistry Recent Labs  Lab 05/18/21 2205 05/20/21 0427 05/21/21 0513 05/22/21 0618 05/23/21 0424  NA 130*   < > 133* 135 132*  K 4.0   < > 3.8 4.3 3.9  CL 95*   < > 90* 92* 89*  CO2 26   < > 36* 35* 34*  GLUCOSE 93   < > 89 88 92  BUN 26*   < > 29* 26* 24*  CREATININE 0.95   < > 1.26* 1.05* 1.03*  CALCIUM 9.0   < > 8.6* 8.6* 8.4*  PROT 6.6  --   --   --   --   ALBUMIN 3.4*  --   --   --   --   AST 37  --   --   --   --   ALT 16  --   --   --   --   ALKPHOS 94  --   --   --   --   BILITOT 1.7*  --   --   --   --   GFRNONAA >60   < > 49* >  60 >60  ANIONGAP 9   < > 7 8 9    < > = values in this interval not displayed.      Hematology Recent Labs  Lab 05/18/21 2205 05/20/21 0427 05/21/21 0513  WBC 11.7* 8.3 8.5  RBC 3.74* 3.44* 3.74*  HGB 10.6* 9.8* 10.6*  HCT 33.1* 31.2* 34.3*  MCV 88.5 90.7 91.7  MCH 28.3 28.5 28.3  MCHC 32.0 31.4 30.9  RDW 16.3* 16.5* 16.9*  PLT 310 224 270     Cardiac EnzymesNo results for input(s): TROPONINI in the last 168 hours. No results for input(s): TROPIPOC in the last 168 hours.   BNP Recent Labs  Lab 05/18/21 2205  BNP 4,147.9*      DDimer No results for input(s): DDIMER in the last 168 hours.   Radiology    US Venous Img Upper Uni Left  Result Date: 05/18/2021 IMPRESSION: No evidence of DVT within the left upper extremity. Diffuse subcutaneous edema. Electronically Signed   By: Dorise Bullion III M.D   On: 05/18/2021 23:58   DG Chest Port 1 View  Result Date: 05/18/2021 IMPRESSION: Bilateral airspace disease with cardiomegaly and probable layering effusions. Findings could reflect edema or infection. Electronically Signed   By: Rolm Baptise M.D.   On: 05/18/2021 22:03    Cardiac Studies   2D echo 03/20/2020: 1. Left ventricular ejection fraction, by estimation, is  40 to 45%. The  left ventricle has mildly decreased function. The left ventricle  demonstrates global hypokinesis. The left ventricular internal cavity size  was mildly dilated. Left ventricular  diastolic function could not be evaluated.   2. Right ventricular systolic function is normal. The right ventricular  size is normal. There is moderately elevated pulmonary artery systolic  pressure.   3. Left atrial size was mildly dilated.   4. Moderate mitral subvalvular thickening/fibrosis.   5. The mitral valve is rheumatic. Moderate to severe mitral valve  regurgitation. Mild mitral stenosis. The mean mitral valve gradient is 7.3  mmHg with average heart rate of 105 bpm.   6. The aortic valve is normal in structure. Aortic valve regurgitation is  not visualized.   7. The inferior vena cava is dilated in size with <50% respiratory  variability, suggesting right atrial pressure of 15 mmHg.   Conclusion(s)/Recommendation(s): Consider TEE for better mitral  regurgitation evaluation.  __________  [facility] 03/23/2020:  Hemodynamics: LV end diastolic pressure is moderately elevated. ----Coronary Angiography----- Ost RCA to Dist RCA lesion is 100% stenosed.-The PDA and PL system fills via faint collaterals from the AV groove LCx and LAD septals. Mid LAD-1 lesion is 60% stenosed. Mid LAD-2 lesion is 50% stenosed. Dist LAD lesion is 55% stenosed with 70% stenosed side branch in 2nd Diag. 1st Diag lesion is 70% stenosed. Ost Cx to Prox Cx lesion is 45% stenosed. Prox-MID Cx lesion is 65% stenosed.   MODERATE-SEVERE THREE-VESSEL CAD: 100% proximal RCA occlusion with left-to-right collaterals faintly filling PDA and PL system (AV groove LCx-PL and LAD septal-PDA) Diffuse moderate mid LAD disease with 60% to 50% stenosis. Tandem 50% and 65% proximal and mid LCx with the 65% lesion being the most significant lesion. Moderately elevated LVEDP of 18-20 mmHg   Given the extent of disease in the LAD and  LCx, neither lesion is very amenable to PCI, and RCA is chronically occluded.  Given her comorbidities, I do not think she would be a good CABG candidate especially in light of the fact that she would likely require  valve surgery as well.  She would not recover well.   Recommendations aggressive medical management. __________  2D echo 02/07/2021: 1. Left ventricular ejection fraction, by estimation, is 40 to 45%. Left  ventricular ejection fraction by 3D volume is 47 %. The left ventricle has  mildly decreased function. The left ventricle demonstrates global  hypokinesis. Left ventricular diastolic   parameters are consistent with Grade II diastolic dysfunction  (pseudonormalization). Elevated left ventricular end-diastolic pressure.   2. Right ventricular systolic function is severely reduced. The right  ventricular size is mildly enlarged. There is severely elevated pulmonary  artery systolic pressure. The estimated right ventricular systolic  pressure is 51.7 mmHg.   3. Left atrial size was moderately dilated.   4. Right atrial size was severely dilated.   5. The mitral valve is degenerative with mild to moderate thickening of  the mitral valve leaflets. There is teathering of the MV leaflets due to  mild LV dysfunction. Severe mitral valve regurgitation. Mild to moderate  mitral stenosis. The mean mitral  valve gradient is 6.5 mmHg.   6. Tricuspid valve regurgitation is moderate to severe.   7. The aortic valve is tricuspid. Aortic valve regurgitation is mild.  Mild aortic valve sclerosis is present, with no evidence of aortic valve  stenosis. Aortic regurgitation PHT measures 499 msec.   8. The inferior vena cava is dilated in size with <50% respiratory  variability, suggesting right atrial pressure of 15 mmHg.   9. There is a trivial pericardial effusion posterior to the left  ventricle.  10. Compared to prior echo, Mitral regurgitation appears worse and RV  dysfunction is now  present. There is severe Pulmonary HTN.    Patient Profile     60 y.o. female with history of multivessel CAD medically managed as above, chronic combined systolic and diastolic CHF secondary to ICM, moderate to severe mitral regurgitation with rheumatic appearing mitral valve without severe mitral stenosis, pulmonary hypertension, MS, COPD, anemia, prior tobacco use and chronic dyspnea who was admitted with acute on chronic combined systolic and diastolic CHF.   Assessment & Plan    1. Acute hypoxic respiratory failure: -Multifactorial including acute on chronic combined CHF, pulmonary hypertension, moderate to severe mitral regurgitation, COPD, anemia, and physical deconditioning  -Wean supplemental oxygen as tolerated, remains on 2 L via nasal cannula  2. Acute on chronic combined systolic and diastolic CHF/ICM/pulmonary hypertension/anasarca: -She remains significantly volume up -Continue IV Lasix to 60 mg bid with close monitoring of renal function -TED hose -Cannot exclude some degree of dependent edema  -Continue current GDMT including Coreg and losartan, look to escalate evidence-based therapy throughout her admission as vitals and labs allow  -May need a RHC pending symptom trend, UOP, and renal function  -Weight trends have been inaccurate -Recommend daily standing scale weights -Strict Ins and outs -CHF education   3. Moderate to severe mitral regurgitation: -Contributing to overall presentation  -Ideally, she would require mitral valve surgery, though has not been felt to be a candidate as outlined below -Consider TEE following diuresis, possibly outpatient with primary cardiologist  -Diurese as above   4. CAD involving the native coronary arteries with elevated HS-TN: -Minimal elevation in high sensitivity troponin that is flat trending and not consistent with ACS -Likely supply demand ischemia in the setting of known CTO of the RCA along with moderate disease involving  the left coronary tree with volume overload  -Not felt to be a candidate for CABG due to underlying lung  disease and MS -PTA ASA and Crestor   5. COPD: -Per IM  6. Anemia: -Stable   7. AKI: -Stable -Monitor with diuresis    For questions or updates, please contact [place] Please consult [email or web address] for contact info under Cardiology/STEMI.    Signed, Christell Faith, PA-C [facility] Pager: [phone] 05/23/2021, 8:36 AM

## 2021-05-23 NOTE — Progress Notes (Signed)
PROGRESS NOTE    Anita [name]  KDX:[id number] DOB: 12-27-1960 DOA: 05/18/2021 PCP: [name], NP    Brief Narrative:  60 year old female with history of multiple sclerosis, COPD, CAD, chronic combined CHF, last EF 40 to 45% 02/2021, ischemic cardiomyopathy, severe mitral regurgitation with limited surgical options, severe pulmonary hypertension, previously on home O2, HTN, ex smoker, presenting with anasarca.    Patient has been diuresing effectively on Lasix 40 mg IV twice daily.  She remains significantly anasarcic.  Diuresing well.  6 L net negative over admission   Assessment & Plan:   Principal Problem:   Acute on chronic combined systolic and diastolic CHF (congestive heart failure) (HCC) Active Problems:   Multiple sclerosis (HCC)   Essential hypertension   Pulmonary hypertension (HCC)   COPD (chronic obstructive pulmonary disease) (HCC)   Ischemic cardiomyopathy   Coronary artery disease involving native coronary artery of native heart without angina pectoris   Mitral valve insufficiency   UTI (urinary tract infection)   CHF (congestive heart failure) (HCC)   Hypoxia   Anasarca  Anasarca   Acute hypoxic respiratory failure due to CHF exacerbation   Acute on chronic combined systolic and diastolic CHF   Ischemic cardiomyopathy   Severe mitral regurgitation - Last echo on file from March 2022 with grade 2 diastolic dysfunction EF 40 to 45% with severe mitral regurg - On admission BNP 4147 with troponin trending down, EKG nonacute and no chest pain - Suspect related to severe mitral regurg as well as severe pulmonary hypertension -- Diuresing well, 6 L net negative but still remains significantly edematous Plan: Continue IV Lasix 60 mg twice daily today Reassess daily with urine output and kidney function to titrate diuretics Thigh-high TED hose Needs to mobilize     Multiple sclerosis (HCC) - stable     Essential hypertension - Continue Coreg  and losartan   Hypoxia Pulmonary hypertension (HCC) COPD (chronic obstructive pulmonary disease) (HCC) - O2 sat 89% on room air requiring 2 L O2 - Patient was previously on home O2, not currently.  May need home O2 eval prior to discharge. will need oxygen qualification - DuoNebs as needed   CAD - Troponin trending down with nonacute EKG and no chest pain - Continue aspirin, rosuvastatin and Coreg   Possible UTI (urinary tract infection) - Urinalysis with large LE and many bacteria but no urinary symptoms or fever.  No leukocytosis - It appears no culture was sent on the original UA -Patient not having any urinary symptoms -No indication for antibiotics at this time   DVT prophylaxis: SQ Lovenox Code Status: Full Family Communication: Daughter [name] [phone] on 6/16 Disposition Plan: Status is: Inpatient  Remains inpatient appropriate because:Inpatient level of care appropriate due to severity of illness  Dispo: The patient is from: Home              Anticipated d/c is to: Home with home health              Patient currently is not medically stable to d/c.   Difficult to place patient No Responding to IV diuresis but continues to be significantly fluid overloaded.     Level of care: Progressive Cardiac  Consultants:  Cardiology  Procedures:  None  Antimicrobials:  None   Subjective: Patient seen and examined.  Lying comfortably in bed.  No visible distress.  No pain complaints.  Objective: Vitals:   05/23/21 0123 05/23/21 0337 05/23/21 0748 05/23/21 1113  BP:  121/76 (!) 143/82 116/71  Pulse:  66 69 60  Resp:  20 18 18   Temp:  98.4 F (36.9 C) 97.9 F (36.6 C) 97.9 F (36.6 C)  TempSrc:      SpO2:  100% 100% 98%  Weight: 83.1 kg     Height:        Intake/Output Summary (Last 24 hours) at 05/23/2021 1406 Last data filed at 05/23/2021 1330 Gross per 24 hour  Intake 580 ml  Output 2700 ml  Net -2120 ml   Filed Weights   05/21/21 2027  05/22/21 0128 05/23/21 0123  Weight: 78 kg 82.1 kg 83.1 kg    Examination:  General exam: No apparent distress Respiratory system: Scattered crackles bilaterally.  Positive JVD.  Normal work of breathing.  Room air Cardiovascular system: F8-H8, systolic murmur, significant pitting edema bilateral upper and lower extremities Gastrointestinal system: Abdomen is nondistended, soft and nontender. No organomegaly or masses felt. Normal bowel sounds heard. Central nervous system: Alert and oriented. No focal neurological deficits. Extremities: Symmetric 5 x 5 power. Skin: No rashes, lesions or ulcers Psychiatry: Judgement and insight appear normal. Mood & affect appropriate.     Data Reviewed: I have personally reviewed following labs and imaging studies  CBC: Recent Labs  Lab 05/18/21 2205 05/20/21 0427 05/21/21 0513  WBC 11.7* 8.3 8.5  NEUTROABS 10.1*  --   --   HGB 10.6* 9.8* 10.6*  HCT 33.1* 31.2* 34.3*  MCV 88.5 90.7 91.7  PLT 310 224 299   Basic Metabolic Panel: Recent Labs  Lab 05/18/21 2205 05/20/21 0427 05/21/21 0513 05/22/21 0618 05/23/21 0424  NA 130* 132* 133* 135 132*  K 4.0 3.6 3.8 4.3 3.9  CL 95* 95* 90* 92* 89*  CO2 26 29 36* 35* 34*  GLUCOSE 93 76 89 88 92  BUN 26* 30* 29* 26* 24*  CREATININE 0.95 0.99 1.26* 1.05* 1.03*  CALCIUM 9.0 8.4* 8.6* 8.6* 8.4*   GFR: Estimated Creatinine Clearance: 60.8 mL/min (A) (by C-G formula based on SCr of 1.03 mg/dL (H)). Liver Function Tests: Recent Labs  Lab 05/18/21 2205  AST 37  ALT 16  ALKPHOS 94  BILITOT 1.7*  PROT 6.6  ALBUMIN 3.4*   No results for input(s): LIPASE, AMYLASE in the last 168 hours. No results for input(s): AMMONIA in the last 168 hours. Coagulation Profile: No results for input(s): INR, PROTIME in the last 168 hours. Cardiac Enzymes: No results for input(s): CKTOTAL, CKMB, CKMBINDEX, TROPONINI in the last 168 hours. BNP (last 3 results) No results for input(s): PROBNP in the last  8760 hours. HbA1C: No results for input(s): HGBA1C in the last 72 hours. CBG: Recent Labs  Lab 05/19/21 2254  GLUCAP 100*   Lipid Profile: No results for input(s): CHOL, HDL, LDLCALC, TRIG, CHOLHDL, LDLDIRECT in the last 72 hours. Thyroid Function Tests: No results for input(s): TSH, T4TOTAL, FREET4, T3FREE, THYROIDAB in the last 72 hours. Anemia Panel: No results for input(s): VITAMINB12, FOLATE, FERRITIN, TIBC, IRON, RETICCTPCT in the last 72 hours. Sepsis Labs: Recent Labs  Lab 05/18/21 2346  LATICACIDVEN 1.8    Recent Results (from the past 240 hour(s))  Resp Panel by RT-PCR (Flu A&B, Covid) Nasopharyngeal Swab     Status: None   Collection Time: 05/18/21 10:05 PM   Specimen: Nasopharyngeal Swab; Nasopharyngeal(NP) swabs in vial transport medium  Result Value Ref Range Status   SARS Coronavirus 2 by RT PCR NEGATIVE NEGATIVE Final    Comment: (NOTE) SARS-CoV-2 target nucleic  acids are NOT DETECTED.  The SARS-CoV-2 RNA is generally detectable in upper respiratory specimens during the acute phase of infection. The lowest concentration of SARS-CoV-2 viral copies this assay can detect is 138 copies/mL. A negative result does not preclude SARS-Cov-2 infection and should not be used as the sole basis for treatment or other patient management decisions. A negative result may occur with  improper specimen collection/handling, submission of specimen other than nasopharyngeal swab, presence of viral mutation(s) within the areas targeted by this assay, and inadequate number of viral copies(<138 copies/mL). A negative result must be combined with clinical observations, patient history, and epidemiological information. The expected result is Negative.  Fact Sheet for Patients:  EntrepreneurPulse.com.au  Fact Sheet for Healthcare Providers:  IncredibleEmployment.be  This test is no t yet approved or cleared by the [place] FDA and  has  been authorized for detection and/or diagnosis of SARS-CoV-2 by FDA under an Emergency Use Authorization (EUA). This EUA will remain  in effect (meaning this test can be used) for the duration of the COVID-19 declaration under Section 564(b)(1) of the Act, 21 U.S.C.section 360bbb-3(b)(1), unless the authorization is terminated  or revoked sooner.       Influenza A by PCR NEGATIVE NEGATIVE Final   Influenza B by PCR NEGATIVE NEGATIVE Final    Comment: (NOTE) The Xpert Xpress SARS-CoV-2/FLU/RSV plus assay is intended as an aid in the diagnosis of influenza from Nasopharyngeal swab specimens and should not be used as a sole basis for treatment. Nasal washings and aspirates are unacceptable for Xpert Xpress SARS-CoV-2/FLU/RSV testing.  Fact Sheet for Patients: EntrepreneurPulse.com.au  Fact Sheet for Healthcare Providers: IncredibleEmployment.be  This test is not yet approved or cleared by the [place] FDA and has been authorized for detection and/or diagnosis of SARS-CoV-2 by FDA under an Emergency Use Authorization (EUA). This EUA will remain in effect (meaning this test can be used) for the duration of the COVID-19 declaration under Section 564(b)(1) of the Act, 21 U.S.C. section 360bbb-3(b)(1), unless the authorization is terminated or revoked.  Performed at [facility], [address], [place], [place] [address]          Radiology Studies: No results found.      Scheduled Meds:  aspirin EC  81 mg Oral Daily   carvedilol  3.125 mg Oral BID WC   enoxaparin (LOVENOX) injection  40 mg Subcutaneous Q24H   furosemide  60 mg Intravenous Q12H   losartan  12.5 mg Oral Daily   potassium chloride  30 mEq Oral Daily   rosuvastatin  20 mg Oral Daily   sodium chloride flush  3 mL Intravenous Q12H   Continuous Infusions:  sodium chloride       LOS: 4 days    Time spent: 25 minutes    Sidney Ace,  MD [facility] Pager [phone]  If 7PM-7AM, please contact night-coverage 05/23/2021, 2:06 PM

## 2021-05-24 ENCOUNTER — Telehealth: Payer: Self-pay | Admitting: Primary Care

## 2021-05-24 DIAGNOSIS — I2510 Atherosclerotic heart disease of native coronary artery without angina pectoris: Secondary | ICD-10-CM | POA: Diagnosis not present

## 2021-05-24 DIAGNOSIS — I255 Ischemic cardiomyopathy: Secondary | ICD-10-CM | POA: Diagnosis not present

## 2021-05-24 DIAGNOSIS — J439 Emphysema, unspecified: Secondary | ICD-10-CM | POA: Diagnosis not present

## 2021-05-24 DIAGNOSIS — I5043 Acute on chronic combined systolic (congestive) and diastolic (congestive) heart failure: Secondary | ICD-10-CM | POA: Diagnosis not present

## 2021-05-24 LAB — BASIC METABOLIC PANEL
Anion gap: 6 (ref 5–15)
BUN: 24 mg/dL — ABNORMAL HIGH (ref 6–20)
CO2: 36 mmol/L — ABNORMAL HIGH (ref 22–32)
Calcium: 8.5 mg/dL — ABNORMAL LOW (ref 8.9–10.3)
Chloride: 88 mmol/L — ABNORMAL LOW (ref 98–111)
Creatinine, Ser: 1.18 mg/dL — ABNORMAL HIGH (ref 0.44–1.00)
GFR, Estimated: 54 mL/min — ABNORMAL LOW (ref >60)
Glucose, Bld: 94 mg/dL (ref 70–99)
Potassium: 4.6 mmol/L (ref 3.5–5.1)
Sodium: 130 mmol/L — ABNORMAL LOW (ref 135–145)

## 2021-05-24 MED ORDER — FUROSEMIDE 10 MG/ML IJ SOLN
60.0000 mg | Freq: Every day | INTRAMUSCULAR | Status: DC
  Administered 2021-05-24: 60 mg via INTRAVENOUS
  Filled 2021-05-24: qty 6

## 2021-05-24 MED ORDER — FUROSEMIDE 10 MG/ML IJ SOLN
60.0000 mg | Freq: Two times a day (BID) | INTRAMUSCULAR | Status: DC
  Administered 2021-05-24 – 2021-05-25 (×2): 60 mg via INTRAVENOUS
  Filled 2021-05-24 (×2): qty 6

## 2021-05-24 NOTE — Progress Notes (Signed)
Physical Therapy Treatment Patient Details Name: Anita [name] MRN: [id number] DOB: Oct 15, 1961 Today's Date: 05/24/2021    History of Present Illness Pt admitted for acute/chronic systolic and diastolic CHF with complaints of facial swelling and SOB symptoms x 3 weeks. History includes MS, CHF, COPD, and CAD.    PT Comments    Patient received in bed. Patient declines getting oob. She is worried that she will be left up in chair. Patient is agreeable to bed exercises and sitting up on side of bed. Patient requires mod assist for bed mobility and upon sitting at edge of bed she became anxious and wanted to lie back down. She will continue to benefit from skilled PT while here to improve strength and functional independence for hopeful return to home with her daughter?? Continuing to recommend SNF for right now.     Follow Up Recommendations  SNF     Equipment Recommendations  Other (comment) (TBD)    Recommendations for Other Services       Precautions / Restrictions Precautions Precautions: Fall Restrictions Weight Bearing Restrictions: No    Mobility  Bed Mobility Overal bed mobility: Needs Assistance Bed Mobility: Supine to Sit;Sit to Supine     Supine to sit: Mod assist Sit to supine: Mod assist   General bed mobility comments: mod assist to raise trunk, scoot forward and to bring LEs off bed once seated patient requests to lie back down. Gets anxious and fearful sitting up on edge of bed.    Transfers                 General transfer comment: patient declines  Ambulation/Gait             General Gait Details: unable   Stairs             Wheelchair Mobility    Modified Rankin (Stroke Patients Only)       Balance Overall balance assessment: Needs assistance Sitting-balance support: Bilateral upper extremity supported;Feet unsupported Sitting balance-Leahy Scale: Fair Sitting balance - Comments: anxious with sitting                                     Cognition Arousal/Alertness: Awake/alert Behavior During Therapy: WFL for tasks assessed/performed;Anxious Overall Cognitive Status: Within Functional Limits for tasks assessed                                 General Comments: Pt alert and oriented during session.      Exercises Other Exercises Other Exercises: B LE strengthening exercises: Ap, heel slides, hip abd/add, SLR, pillow squeezes, SAQ x 8-10 reps. No physical assist. Cues for technique    General Comments        Pertinent Vitals/Pain Pain Assessment: Faces Faces Pain Scale: No hurt Pain Location: says her hips hurt "sometimes"    Home Living                      Prior Function            PT Goals (current goals can now be found in the care plan section) Acute Rehab PT Goals Patient Stated Goal: to go home with daughter PT Goal Formulation: With patient Time For Goal Achievement: 06/04/21 Potential to Achieve Goals: Fair Progress towards PT goals: Not progressing toward goals - comment (limited  by fear, anxiety)    Frequency    Min 2X/week      PT Plan Current plan remains appropriate    Co-evaluation              AM-PAC PT "6 Clicks" Mobility   Outcome Measure  Help needed turning from your back to your side while in a flat bed without using bedrails?: A Lot Help needed moving from lying on your back to sitting on the side of a flat bed without using bedrails?: A Lot Help needed moving to and from a bed to a chair (including a wheelchair)?: A Lot Help needed standing up from a chair using your arms (e.g., wheelchair or bedside chair)?: A Lot Help needed to walk in hospital room?: Total Help needed climbing 3-5 steps with a railing? : Total 6 Click Score: 10    End of Session Equipment Utilized During Treatment: Oxygen Activity Tolerance: Other (comment) (limited by anxiety/fear) Patient left: in bed;with bed alarm set Nurse  Communication: Mobility status PT Visit Diagnosis: Muscle weakness (generalized) (M62.81);Other abnormalities of gait and mobility (R26.89)     Time: 1420-1443 PT Time Calculation (min) (ACUTE ONLY): 23 min  Charges:  $Therapeutic Exercise: 8-22 mins $Therapeutic Activity: 8-22 mins                    [employer], PT, GCS 05/24/21,3:03 PM

## 2021-05-24 NOTE — Progress Notes (Signed)
Progress Note  Patient Name: Anita [name] Date of Encounter: 05/24/2021  [facility] Cardiologist: Buford Dresser, MD   Subjective   Long discussion with her today with nursing, Discussed home situation, daughter prefers that she not get up to go to the bathroom and that she wear a depends undergarment Will walk if she has to, does not do any regular walking at home Feels her legs are weak Daughter does her medications, works from home  Inpatient Medications    Scheduled Meds:  aspirin EC  81 mg Oral Daily   carvedilol  3.125 mg Oral BID WC   enoxaparin (LOVENOX) injection  40 mg Subcutaneous Q24H   furosemide  60 mg Intravenous BID   losartan  12.5 mg Oral Daily   potassium chloride  30 mEq Oral Daily   rosuvastatin  20 mg Oral Daily   sodium chloride flush  3 mL Intravenous Q12H   Continuous Infusions:  sodium chloride     PRN Meds: sodium chloride, acetaminophen **OR** acetaminophen, albuterol, HYDROcodone-acetaminophen, nitroGLYCERIN, ondansetron **OR** ondansetron (ZOFRAN) IV, senna-docusate, sodium chloride flush   Vital Signs    Vitals:   05/24/21 0100 05/24/21 0318 05/24/21 0757 05/24/21 1208  BP:  127/73 116/72 123/79  Pulse:  68 65 67  Resp:  18 16 20   Temp:  97.8 F (36.6 C) 98.1 F (36.7 C) 97.7 F (36.5 C)  TempSrc:  Oral Oral Oral  SpO2:  100% 100% 99%  Weight: 75.1 kg 75.1 kg    Height:        Intake/Output Summary (Last 24 hours) at 05/24/2021 1258 Last data filed at 05/24/2021 1209 Gross per 24 hour  Intake 970 ml  Output 4750 ml  Net -3780 ml   Last 3 Weights 05/24/2021 05/24/2021 05/23/2021  Weight (lbs) 165 lb 8 oz 165 lb 9.1 oz 183 lb 1.6 oz  Weight (kg) 75.07 kg 75.1 kg 83.054 kg      Telemetry    Normal sinus rhythm- Personally Reviewed  ECG    - Personally Reviewed  Physical Exam   GEN: No acute distress.   Neck:  JVD 10+ Cardiac: RRR, no murmurs, rubs, or gallops.  Respiratory: Clear to auscultation  bilaterally. GI: Soft, nontender, non-distended  MS: 2+ pitting thigh edema to the buttocks, sacral edema  No deformity. Neuro:  Nonfocal  Psych: Normal affect   Labs    High Sensitivity Troponin:   Recent Labs  Lab 05/18/21 2205 05/18/21 2356  TROPONINIHS 20* 18*      Chemistry Recent Labs  Lab 05/18/21 2205 05/20/21 0427 05/22/21 0618 05/23/21 0424 05/24/21 0435  NA 130*   < > 135 132* 130*  K 4.0   < > 4.3 3.9 4.6  CL 95*   < > 92* 89* 88*  CO2 26   < > 35* 34* 36*  GLUCOSE 93   < > 88 92 94  BUN 26*   < > 26* 24* 24*  CREATININE 0.95   < > 1.05* 1.03* 1.18*  CALCIUM 9.0   < > 8.6* 8.4* 8.5*  PROT 6.6  --   --   --   --   ALBUMIN 3.4*  --   --   --   --   AST 37  --   --   --   --   ALT 16  --   --   --   --   ALKPHOS 94  --   --   --   --  BILITOT 1.7*  --   --   --   --   GFRNONAA >60   < > >60 >60 54*  ANIONGAP 9   < > 8 9 6    < > = values in this interval not displayed.     Hematology Recent Labs  Lab 05/18/21 2205 05/20/21 0427 05/21/21 0513  WBC 11.7* 8.3 8.5  RBC 3.74* 3.44* 3.74*  HGB 10.6* 9.8* 10.6*  HCT 33.1* 31.2* 34.3*  MCV 88.5 90.7 91.7  MCH 28.3 28.5 28.3  MCHC 32.0 31.4 30.9  RDW 16.3* 16.5* 16.9*  PLT 310 224 270    BNP Recent Labs  Lab 05/18/21 2205  BNP 4,147.9*     DDimer No results for input(s): DDIMER in the last 168 hours.   Radiology    No results found.  Cardiac Studies     Patient Profile     60 y.o. female with history of multivessel CAD medically managed as above, chronic combined systolic and diastolic CHF secondary to ICM, moderate to severe mitral regurgitation with rheumatic appearing mitral valve without severe mitral stenosis, pulmonary hypertension, MS, COPD, anemia, prior tobacco use and chronic dyspnea who was admitted with acute on chronic combined systolic and diastolic CHF.  Assessment & Plan    Acute on chronic hypoxic respiratory failure   Multifactorial including acute on chronic  combined systolic and diastolic CHF, pulmonary hypertension, COPD, mitral valve regurgitation,   -Still with significant thigh edema, pitting 2+ extending into the buttocks and sacrum 10 L negative Stable renal function      Acute on chronic combined systolic and diastolic CHF/pulmonary hypertension  Diffuse anasarca, slow improvement with IV Lasix  -Suspect component of dependent edema as she does not get out of bed -Encouraged continued diuresis, Lasix IV twice daily with movement out of bed Renal function stable    Moderate to severe mitral valve regurgitation   -Suspect we will see some improvement with further diuresis  -Repeat echocardiogram as outpatient   Debility High fall risk, refusing rehab    Coronary artery disease with stable angina severe disease, not a good candidate for CABG given frail state, MS   Continue aspirin, Crestor    Long discussion with patient and nursing on rounds today, stressed need to get out of bed, sit in a chair, walk with her walker  Total encounter time more than 35 minutes  Greater than 50% was spent in counseling and coordination of care with the patient   For questions or updates, please contact [place] Please consult [email or web address] for contact info under        Signed, Ida Rogue, MD  05/24/2021, 12:58 PM

## 2021-05-24 NOTE — Progress Notes (Signed)
Occupational Therapy Treatment Patient Details Name: Anita [name] MRN: [id number] DOB: 21-May-1961 Today's Date: 05/24/2021    History of present illness Pt admitted for acute/chronic systolic and diastolic CHF with complaints of facial swelling and SOB symptoms x 3 weeks. History includes MS, CHF, COPD, and CAD.   OT comments  Upon entering the room, pt supine in bed and yells, "bed pan"! OT assuming pt needing bed pan for BM and obtained all needed items. Pt rolling R in bed with use of bed rails and min A and bed pan noted to be under pt. Pt needing total A for hygiene after BM and assistance to change gown secondary to it being soiled. RN arrived with medication and pt declined further therapeutic activities. All needs within reach and bed alarm activated.   Follow Up Recommendations  SNF    Equipment Recommendations  Other (comment) (defer to next venue of care)       Precautions / Restrictions Precautions Precautions: Fall Restrictions Weight Bearing Restrictions: No       Mobility Bed Mobility Overal bed mobility: Needs Assistance Bed Mobility: Rolling Rolling: Min assist   Supine to sit: Mod assist Sit to supine: Mod assist   General bed mobility comments: min cuing for technique and use of bed rail    Transfers                 General transfer comment: Pt refusal    Balance Overall balance assessment: Needs assistance Sitting-balance support: Bilateral upper extremity supported;Feet unsupported Sitting balance-Leahy Scale: Fair Sitting balance - Comments: anxious with sitting                                   ADL either performed or assessed with clinical judgement     Vision Patient Visual Report: No change from baseline            Cognition Arousal/Alertness: Awake/alert Behavior During Therapy: WFL for tasks assessed/performed;Anxious Overall Cognitive Status: Within Functional Limits for tasks assessed                                  General Comments: Pt alert and oriented during session.        Exercises Other Exercises Other Exercises: B LE strengthening exercises: Ap, heel slides, hip abd/add, SLR, pillow squeezes, SAQ x 8-10 reps. No physical assist. Cues for technique    Pertinent Vitals/ Pain       Pain Assessment: Faces Faces Pain Scale: No hurt Pain Location: says her hips hurt "sometimes"   Frequency  Min 2X/week        Progress Toward Goals  OT Goals(current goals can now be found in the care plan section)  Progress towards OT goals: Progressing toward goals  Acute Rehab OT Goals Patient Stated Goal: to go home with daughter OT Goal Formulation: With patient Time For Goal Achievement: 06/04/21 Potential to Achieve Goals: [place] Discharge plan remains appropriate;Frequency remains appropriate       AM-PAC OT "6 Clicks" Daily Activity     Outcome Measure   Help from another person eating meals?: None Help from another person taking care of personal grooming?: A Little Help from another person toileting, which includes using toliet, bedpan, or urinal?: Total Help from another person bathing (including washing, rinsing, drying)?: A Lot Help from another person to put  on and taking off regular upper body clothing?: A Little Help from another person to put on and taking off regular lower body clothing?: Total 6 Click Score: 14    End of Session Equipment Utilized During Treatment: Oxygen  OT Visit Diagnosis: Unsteadiness on feet (R26.81);Muscle weakness (generalized) (M62.81);Repeated falls (R29.6);History of falling (Z91.81)   Activity Tolerance Other (comment) (pt refusal)                  Time: 9924-2683 OT Time Calculation (min): 10 min  Charges: OT General Charges $OT Visit: 1 Visit OT Treatments $Self Care/Home Management : 8-22 mins  Darleen Crocker, MS, OTR/L , CBIS ascom [phone]  05/24/21, 3:54 PM

## 2021-05-24 NOTE — Progress Notes (Signed)
PT Cancellation Note  Patient Details Name: MARIEKE LUBKE MRN: [id number] DOB: Dec 04, 1961   Cancelled Treatment:    Reason Eval/Treat Not Completed: Patient declined, no reason specified. Patient states she cannot reach her lunch. Declines PT as she wants to eat. Assisted patient in getting better positioned in bed and set up with lunch tray. Will try to return later for PT session.      Rickey Farrier 05/24/2021, 1:36 PM

## 2021-05-24 NOTE — Telephone Encounter (Signed)
error 

## 2021-05-24 NOTE — Care Management Important Message (Signed)
Important Message  Patient Details  Name: Anita [name] MRN: [id number] Date of Birth: [birth year]   Medicare Important Message Given:  Yes     Dannette Barbara 05/24/2021, 12:33 PM

## 2021-05-24 NOTE — Progress Notes (Signed)
PROGRESS NOTE    Anita [name]  NOI:[id number] DOB: 1961-05-25 DOA: 05/18/2021 PCP: [name], NP    Brief Narrative:  60 year old female with history of multiple sclerosis, COPD, CAD, chronic combined CHF, last EF 40 to 45% 02/2021, ischemic cardiomyopathy, severe mitral regurgitation with limited surgical options, severe pulmonary hypertension, previously on home O2, HTN, ex smoker, presenting with anasarca.    Patient has been diuresing effectively on Lasix 60 mg IV twice daily.  She remains significantly anasarcic.  Diuresing well.  6 L net negative over admission.  Patient has quite a bit of a dependent edema that likely is related to her immobility.  Therapy recommends a skilled nursing facility however after my conversations with the daughter she is refusing SNF placement due to previous bad experiences.  The patient will need to mobilize as much as possible otherwise will be very difficult to get her back to dry weight even with aggressive diuresis   Assessment & Plan:   Principal Problem:   Acute on chronic combined systolic and diastolic CHF (congestive heart failure) (HCC) Active Problems:   Multiple sclerosis (HCC)   Essential hypertension   Pulmonary hypertension (HCC)   COPD (chronic obstructive pulmonary disease) ([place])   Ischemic cardiomyopathy   Coronary artery disease involving native coronary artery of native heart without angina pectoris   Mitral valve insufficiency   UTI (urinary tract infection)   CHF (congestive heart failure) (HCC)   Hypoxia   Anasarca  Anasarca   Acute hypoxic respiratory failure due to CHF exacerbation   Acute on chronic combined systolic and diastolic CHF   Ischemic cardiomyopathy   Severe mitral regurgitation - Last echo on file from March 2022 with grade 2 diastolic dysfunction EF 40 to 45% with severe mitral regurg - On admission BNP 4147 with troponin trending down, EKG nonacute and no chest pain - Suspect related  to severe mitral regurg as well as severe pulmonary hypertension -- Diuresing well, 9 L net negative but still remains significantly edematous Plan: Continue IV Lasix 60 mg twice daily for today.  Reassess daily with urine output and kidney function and titrate diuretics.  Continue thigh-high TED hose.  Needs to mobilize.  Possible transition to oral diuretic in preparation for discharge over the weekend.     Multiple sclerosis ([place]) - stable     Essential hypertension - Continue Coreg and losartan   Hypoxia Pulmonary hypertension (HCC) COPD (chronic obstructive pulmonary disease) (HCC) - O2 sat 89% on room air requiring 2 L O2 - Patient was previously on home O2, not currently.  May need home O2 eval prior to discharge. will need oxygen qualification - DuoNebs as needed   CAD - Troponin trending down with nonacute EKG and no chest pain - Continue aspirin, rosuvastatin and Coreg   Possible UTI (urinary tract infection) - Urinalysis with large LE and many bacteria but no urinary symptoms or fever.  No leukocytosis - It appears no culture was sent on the original UA -Patient not having any urinary symptoms -No indication for antibiotics at this time   DVT prophylaxis: SQ Lovenox Code Status: Full Family Communication: Daughter [name] [phone] on 6/16 Disposition Plan: Status is: Inpatient  Remains inpatient appropriate because:Inpatient level of care appropriate due to severity of illness  Dispo: The patient is from: Home              Anticipated d/c is to: Home with home health  Patient currently is not medically stable to d/c.   Difficult to place patient No Responding to IV diuresis but continues to be significantly fluid overloaded.     Level of care: Progressive Cardiac  Consultants:  Cardiology  Procedures:  None  Antimicrobials:  None   Subjective: Patient seen and examined.  Lying in bed.  No visible distress.  Objective: Vitals:    05/24/21 0100 05/24/21 0318 05/24/21 0757 05/24/21 1208  BP:  127/73 116/72 123/79  Pulse:  68 65 67  Resp:  18 16 20   Temp:  97.8 F (36.6 C) 98.1 F (36.7 C) 97.7 F (36.5 C)  TempSrc:  Oral Oral Oral  SpO2:  100% 100% 99%  Weight: 75.1 kg 75.1 kg    Height:        Intake/Output Summary (Last 24 hours) at 05/24/2021 1519 Last data filed at 05/24/2021 1409 Gross per 24 hour  Intake 850 ml  Output 4800 ml  Net -3950 ml   Filed Weights   05/23/21 0123 05/24/21 0100 05/24/21 0318  Weight: 83.1 kg 75.1 kg 75.1 kg    Examination:  General exam: No apparent distress Respiratory system: Scattered crackles bilaterally.  Positive JVD.  Normal work of breathing.  Room air Cardiovascular system: Q2-V9, systolic murmur, significant pitting edema bilateral upper and lower extremities Gastrointestinal system: Abdomen is nondistended, soft and nontender. No organomegaly or masses felt. Normal bowel sounds heard. Central nervous system: Alert and oriented. No focal neurological deficits. Extremities: Symmetric 5 x 5 power. Skin: No rashes, lesions or ulcers Psychiatry: Judgement and insight appear normal. Mood & affect appropriate.     Data Reviewed: I have personally reviewed following labs and imaging studies  CBC: Recent Labs  Lab 05/18/21 2205 05/20/21 0427 05/21/21 0513  WBC 11.7* 8.3 8.5  NEUTROABS 10.1*  --   --   HGB 10.6* 9.8* 10.6*  HCT 33.1* 31.2* 34.3*  MCV 88.5 90.7 91.7  PLT 310 224 563   Basic Metabolic Panel: Recent Labs  Lab 05/20/21 0427 05/21/21 0513 05/22/21 0618 05/23/21 0424 05/24/21 0435  NA 132* 133* 135 132* 130*  K 3.6 3.8 4.3 3.9 4.6  CL 95* 90* 92* 89* 88*  CO2 29 36* 35* 34* 36*  GLUCOSE 76 89 88 92 94  BUN 30* 29* 26* 24* 24*  CREATININE 0.99 1.26* 1.05* 1.03* 1.18*  CALCIUM 8.4* 8.6* 8.6* 8.4* 8.5*   GFR: Estimated Creatinine Clearance: 50.5 mL/min (A) (by C-G formula based on SCr of 1.18 mg/dL (H)). Liver Function Tests: Recent  Labs  Lab 05/18/21 2205  AST 37  ALT 16  ALKPHOS 94  BILITOT 1.7*  PROT 6.6  ALBUMIN 3.4*   No results for input(s): LIPASE, AMYLASE in the last 168 hours. No results for input(s): AMMONIA in the last 168 hours. Coagulation Profile: No results for input(s): INR, PROTIME in the last 168 hours. Cardiac Enzymes: No results for input(s): CKTOTAL, CKMB, CKMBINDEX, TROPONINI in the last 168 hours. BNP (last 3 results) No results for input(s): PROBNP in the last 8760 hours. HbA1C: No results for input(s): HGBA1C in the last 72 hours. CBG: Recent Labs  Lab 05/19/21 2254  GLUCAP 100*   Lipid Profile: No results for input(s): CHOL, HDL, LDLCALC, TRIG, CHOLHDL, LDLDIRECT in the last 72 hours. Thyroid Function Tests: No results for input(s): TSH, T4TOTAL, FREET4, T3FREE, THYROIDAB in the last 72 hours. Anemia Panel: No results for input(s): VITAMINB12, FOLATE, FERRITIN, TIBC, IRON, RETICCTPCT in the last 72 hours. Sepsis Labs:  Recent Labs  Lab 05/18/21 2346  LATICACIDVEN 1.8    Recent Results (from the past 240 hour(s))  Resp Panel by RT-PCR (Flu A&B, Covid) Nasopharyngeal Swab     Status: None   Collection Time: 05/18/21 10:05 PM   Specimen: Nasopharyngeal Swab; Nasopharyngeal(NP) swabs in vial transport medium  Result Value Ref Range Status   SARS Coronavirus 2 by RT PCR NEGATIVE NEGATIVE Final    Comment: (NOTE) SARS-CoV-2 target nucleic acids are NOT DETECTED.  The SARS-CoV-2 RNA is generally detectable in upper respiratory specimens during the acute phase of infection. The lowest concentration of SARS-CoV-2 viral copies this assay can detect is 138 copies/mL. A negative result does not preclude SARS-Cov-2 infection and should not be used as the sole basis for treatment or other patient management decisions. A negative result may occur with  improper specimen collection/handling, submission of specimen other than nasopharyngeal swab, presence of viral mutation(s) within  the areas targeted by this assay, and inadequate number of viral copies(<138 copies/mL). A negative result must be combined with clinical observations, patient history, and epidemiological information. The expected result is Negative.  Fact Sheet for Patients:  EntrepreneurPulse.com.au  Fact Sheet for Healthcare Providers:  IncredibleEmployment.be  This test is no t yet approved or cleared by the [place] FDA and  has been authorized for detection and/or diagnosis of SARS-CoV-2 by FDA under an Emergency Use Authorization (EUA). This EUA will remain  in effect (meaning this test can be used) for the duration of the COVID-19 declaration under Section 564(b)(1) of the Act, 21 U.S.C.section 360bbb-3(b)(1), unless the authorization is terminated  or revoked sooner.       Influenza A by PCR NEGATIVE NEGATIVE Final   Influenza B by PCR NEGATIVE NEGATIVE Final    Comment: (NOTE) The Xpert Xpress SARS-CoV-2/FLU/RSV plus assay is intended as an aid in the diagnosis of influenza from Nasopharyngeal swab specimens and should not be used as a sole basis for treatment. Nasal washings and aspirates are unacceptable for Xpert Xpress SARS-CoV-2/FLU/RSV testing.  Fact Sheet for Patients: EntrepreneurPulse.com.au  Fact Sheet for Healthcare Providers: IncredibleEmployment.be  This test is not yet approved or cleared by the [place] FDA and has been authorized for detection and/or diagnosis of SARS-CoV-2 by FDA under an Emergency Use Authorization (EUA). This EUA will remain in effect (meaning this test can be used) for the duration of the COVID-19 declaration under Section 564(b)(1) of the Act, 21 U.S.C. section 360bbb-3(b)(1), unless the authorization is terminated or revoked.  Performed at [facility], [address] [place], [place] [address]          Radiology Studies: No results  found.      Scheduled Meds:  aspirin EC  81 mg Oral Daily   carvedilol  3.125 mg Oral BID WC   enoxaparin (LOVENOX) injection  40 mg Subcutaneous Q24H   furosemide  60 mg Intravenous BID   losartan  12.5 mg Oral Daily   potassium chloride  30 mEq Oral Daily   rosuvastatin  20 mg Oral Daily   sodium chloride flush  3 mL Intravenous Q12H   Continuous Infusions:  sodium chloride       LOS: 5 days    Time spent: 15 minutes    Sidney Ace, MD [facility] Pager [phone]  If 7PM-7AM, please contact night-coverage 05/24/2021, 3:19 PM

## 2021-05-24 NOTE — Progress Notes (Signed)
   Heart Failure Nurse Navigator Note  Met with patient today, she was awake and watching TV.  Denied any increasing SOB.    She now has a working scale, discussed the importance of weighing daily and recording.  As she keeps track of her blood pressure and 02 saturation at home daily.This has also been discussed with the daughter.  Also stressed the importance of limiting her fluid intake.  Also discussed the importance of getting up out of bed and getting stronger for going home.   Pricilla Riffle RN [facility]

## 2021-05-24 NOTE — Plan of Care (Signed)
°  Problem: Clinical Measurements: °Goal: Respiratory complications will improve °Outcome: Progressing °  °Problem: Clinical Measurements: °Goal: Cardiovascular complication will be avoided °Outcome: Progressing °  °Problem: Pain Managment: °Goal: General experience of comfort will improve °Outcome: Progressing °  °Problem: Safety: °Goal: Ability to remain free from injury will improve °Outcome: Progressing °  °

## 2021-05-25 DIAGNOSIS — J439 Emphysema, unspecified: Secondary | ICD-10-CM | POA: Diagnosis not present

## 2021-05-25 DIAGNOSIS — I2510 Atherosclerotic heart disease of native coronary artery without angina pectoris: Secondary | ICD-10-CM | POA: Diagnosis not present

## 2021-05-25 DIAGNOSIS — R601 Generalized edema: Secondary | ICD-10-CM | POA: Diagnosis not present

## 2021-05-25 DIAGNOSIS — I5043 Acute on chronic combined systolic (congestive) and diastolic (congestive) heart failure: Secondary | ICD-10-CM | POA: Diagnosis not present

## 2021-05-25 LAB — BASIC METABOLIC PANEL
Anion gap: 8 (ref 5–15)
BUN: 21 mg/dL — ABNORMAL HIGH (ref 6–20)
CO2: 39 mmol/L — ABNORMAL HIGH (ref 22–32)
Calcium: 8.7 mg/dL — ABNORMAL LOW (ref 8.9–10.3)
Chloride: 85 mmol/L — ABNORMAL LOW (ref 98–111)
Creatinine, Ser: 1.17 mg/dL — ABNORMAL HIGH (ref 0.44–1.00)
GFR, Estimated: 54 mL/min — ABNORMAL LOW (ref >60)
Glucose, Bld: 84 mg/dL (ref 70–99)
Potassium: 4.7 mmol/L (ref 3.5–5.1)
Sodium: 132 mmol/L — ABNORMAL LOW (ref 135–145)

## 2021-05-25 MED ORDER — SACUBITRIL-VALSARTAN 24-26 MG PO TABS
1.0000 | ORAL_TABLET | Freq: Two times a day (BID) | ORAL | Status: DC
  Administered 2021-05-25 – 2021-05-28 (×7): 1 via ORAL
  Filled 2021-05-25 (×7): qty 1

## 2021-05-25 MED ORDER — FUROSEMIDE 10 MG/ML IJ SOLN
60.0000 mg | Freq: Three times a day (TID) | INTRAMUSCULAR | Status: DC
  Administered 2021-05-25 – 2021-05-27 (×8): 60 mg via INTRAVENOUS
  Filled 2021-05-25 (×8): qty 6

## 2021-05-25 NOTE — Progress Notes (Signed)
PROGRESS NOTE    Anita [name]  DPO:[id number] DOB: 09/02/1961 DOA: 05/18/2021 PCP: [name], NP    Brief Narrative:  60 year old female with history of multiple sclerosis, COPD, CAD, chronic combined CHF, last EF 40 to 45% 02/2021, ischemic cardiomyopathy, severe mitral regurgitation with limited surgical options, severe pulmonary hypertension, previously on home O2, HTN, ex smoker, presenting with anasarca.    Patient has been diuresing effectively on Lasix 60 mg IV twice daily.  She remains significantly anasarcic.  Diuresing well.  6 L net negative over admission.  Patient has quite a bit of a dependent edema that likely is related to her immobility.  Therapy recommends a skilled nursing facility however after my conversations with the daughter she is refusing SNF placement due to previous bad experiences.  The patient will need to mobilize as much as possible otherwise will be very difficult to get her back to dry weight even with aggressive diuresis   Assessment & Plan:   Principal Problem:   Acute on chronic combined systolic and diastolic CHF (congestive heart failure) (HCC) Active Problems:   Multiple sclerosis (HCC)   Essential hypertension   Pulmonary hypertension (HCC)   COPD (chronic obstructive pulmonary disease) ([place])   Ischemic cardiomyopathy   Coronary artery disease involving native coronary artery of native heart without angina pectoris   Mitral valve insufficiency   UTI (urinary tract infection)   CHF (congestive heart failure) (HCC)   Hypoxia   Anasarca  Anasarca   Acute hypoxic respiratory failure due to CHF exacerbation   Acute on chronic combined systolic and diastolic CHF   Ischemic cardiomyopathy   Severe mitral regurgitation - Last echo on file from March 2022 with grade 2 diastolic dysfunction EF 40 to 45% with severe mitral regurg - On admission BNP 4147 with troponin trending down, EKG nonacute and no chest pain - Suspect related  to severe mitral regurg as well as severe pulmonary hypertension -- Diuresing well, kidney function stable, 13 L net negative Plan: As kidney function has remained stable will increase Lasix to 60 mg every 8 hours.  Continue thigh-high TED hose.  Patient needs to mobilize.     Multiple sclerosis ([place]) - stable     Essential hypertension - Continue Coreg and losartan   Hypoxia Pulmonary hypertension (HCC) COPD (chronic obstructive pulmonary disease) (HCC) - O2 sat 89% on room air requiring 2 L O2 - Patient was previously on home O2, not currently.  May need home O2 eval prior to discharge. will need oxygen qualification - DuoNebs as needed   CAD - Troponin trending down with nonacute EKG and no chest pain - Continue aspirin, rosuvastatin and Coreg   Possible UTI (urinary tract infection) - Urinalysis with large LE and many bacteria but no urinary symptoms or fever.  No leukocytosis - It appears no culture was sent on the original UA -Patient not having any urinary symptoms -No indication for antibiotics at this time   DVT prophylaxis: SQ Lovenox Code Status: Full Family Communication: Daughter Anita [name] [phone] on 6/16 Disposition Plan: Status is: Inpatient  Remains inpatient appropriate because:Inpatient level of care appropriate due to severity of illness  Dispo: The patient is from: Home              Anticipated d/c is to: Home with home health              Patient currently is not medically stable to d/c.   Difficult to place patient No Remains significantly  fluid overloaded.  Large amounts of dependent edema.  Continues to diurese well on IV diuretic.     Level of care: Progressive Cardiac  Consultants:  Cardiology  Procedures:  None  Antimicrobials:  None   Subjective: Patient seen and examined.  Lying in bed.  No visible distress.  Objective: Vitals:   05/25/21 0428 05/25/21 0539 05/25/21 0727 05/25/21 1132  BP: 136/77  138/85 102/61  Pulse: 74   70 67  Resp: 16  18 17   Temp: (!) 97.3 F (36.3 C)  97.7 F (36.5 C) 97.9 F (36.6 C)  TempSrc:      SpO2: 100%  100% 100%  Weight:  71.7 kg    Height:        Intake/Output Summary (Last 24 hours) at 05/25/2021 1300 Last data filed at 05/25/2021 1131 Gross per 24 hour  Intake 480 ml  Output 3750 ml  Net -3270 ml   Filed Weights   05/24/21 0100 05/24/21 0318 05/25/21 0539  Weight: 75.1 kg 75.1 kg 71.7 kg    Examination:  General exam: No apparent distress Respiratory system: Scattered crackles bilaterally.  Positive JVD.  Normal work of breathing.  Room air Cardiovascular system: Y7-X4, systolic murmur, significant pitting edema bilateral upper and lower extremities Gastrointestinal system: Abdomen is nondistended, soft and nontender. No organomegaly or masses felt. Normal bowel sounds heard. Central nervous system: Alert and oriented. No focal neurological deficits. Extremities: Symmetric 5 x 5 power. Skin: No rashes, lesions or ulcers Psychiatry: Judgement and insight appear normal. Mood & affect appropriate.     Data Reviewed: I have personally reviewed following labs and imaging studies  CBC: Recent Labs  Lab 05/18/21 2205 05/20/21 0427 05/21/21 0513  WBC 11.7* 8.3 8.5  NEUTROABS 10.1*  --   --   HGB 10.6* 9.8* 10.6*  HCT 33.1* 31.2* 34.3*  MCV 88.5 90.7 91.7  PLT 310 224 128   Basic Metabolic Panel: Recent Labs  Lab 05/21/21 0513 05/22/21 0618 05/23/21 0424 05/24/21 0435 05/25/21 0743  NA 133* 135 132* 130* 132*  K 3.8 4.3 3.9 4.6 4.7  CL 90* 92* 89* 88* 85*  CO2 36* 35* 34* 36* 39*  GLUCOSE 89 88 92 94 84  BUN 29* 26* 24* 24* 21*  CREATININE 1.26* 1.05* 1.03* 1.18* 1.17*  CALCIUM 8.6* 8.6* 8.4* 8.5* 8.7*   GFR: Estimated Creatinine Clearance: 49.7 mL/min (A) (by C-G formula based on SCr of 1.17 mg/dL (H)). Liver Function Tests: Recent Labs  Lab 05/18/21 2205  AST 37  ALT 16  ALKPHOS 94  BILITOT 1.7*  PROT 6.6  ALBUMIN 3.4*   No  results for input(s): LIPASE, AMYLASE in the last 168 hours. No results for input(s): AMMONIA in the last 168 hours. Coagulation Profile: No results for input(s): INR, PROTIME in the last 168 hours. Cardiac Enzymes: No results for input(s): CKTOTAL, CKMB, CKMBINDEX, TROPONINI in the last 168 hours. BNP (last 3 results) No results for input(s): PROBNP in the last 8760 hours. HbA1C: No results for input(s): HGBA1C in the last 72 hours. CBG: Recent Labs  Lab 05/19/21 2254  GLUCAP 100*   Lipid Profile: No results for input(s): CHOL, HDL, LDLCALC, TRIG, CHOLHDL, LDLDIRECT in the last 72 hours. Thyroid Function Tests: No results for input(s): TSH, T4TOTAL, FREET4, T3FREE, THYROIDAB in the last 72 hours. Anemia Panel: No results for input(s): VITAMINB12, FOLATE, FERRITIN, TIBC, IRON, RETICCTPCT in the last 72 hours. Sepsis Labs: Recent Labs  Lab 05/18/21 2346  LATICACIDVEN 1.8  Recent Results (from the past 240 hour(s))  Resp Panel by RT-PCR (Flu A&B, Covid) Nasopharyngeal Swab     Status: None   Collection Time: 05/18/21 10:05 PM   Specimen: Nasopharyngeal Swab; Nasopharyngeal(NP) swabs in vial transport medium  Result Value Ref Range Status   SARS Coronavirus 2 by RT PCR NEGATIVE NEGATIVE Final    Comment: (NOTE) SARS-CoV-2 target nucleic acids are NOT DETECTED.  The SARS-CoV-2 RNA is generally detectable in upper respiratory specimens during the acute phase of infection. The lowest concentration of SARS-CoV-2 viral copies this assay can detect is 138 copies/mL. A negative result does not preclude SARS-Cov-2 infection and should not be used as the sole basis for treatment or other patient management decisions. A negative result may occur with  improper specimen collection/handling, submission of specimen other than nasopharyngeal swab, presence of viral mutation(s) within the areas targeted by this assay, and inadequate number of viral copies(<138 copies/mL). A negative  result must be combined with clinical observations, patient history, and epidemiological information. The expected result is Negative.  Fact Sheet for Patients:  EntrepreneurPulse.com.au  Fact Sheet for Healthcare Providers:  IncredibleEmployment.be  This test is no t yet approved or cleared by the [place] FDA and  has been authorized for detection and/or diagnosis of SARS-CoV-2 by FDA under an Emergency Use Authorization (EUA). This EUA will remain  in effect (meaning this test can be used) for the duration of the COVID-19 declaration under Section 564(b)(1) of the Act, 21 U.S.C.section 360bbb-3(b)(1), unless the authorization is terminated  or revoked sooner.       Influenza A by PCR NEGATIVE NEGATIVE Final   Influenza B by PCR NEGATIVE NEGATIVE Final    Comment: (NOTE) The Xpert Xpress SARS-CoV-2/FLU/RSV plus assay is intended as an aid in the diagnosis of influenza from Nasopharyngeal swab specimens and should not be used as a sole basis for treatment. Nasal washings and aspirates are unacceptable for Xpert Xpress SARS-CoV-2/FLU/RSV testing.  Fact Sheet for Patients: EntrepreneurPulse.com.au  Fact Sheet for Healthcare Providers: IncredibleEmployment.be  This test is not yet approved or cleared by the [place] FDA and has been authorized for detection and/or diagnosis of SARS-CoV-2 by FDA under an Emergency Use Authorization (EUA). This EUA will remain in effect (meaning this test can be used) for the duration of the COVID-19 declaration under Section 564(b)(1) of the Act, 21 U.S.C. section 360bbb-3(b)(1), unless the authorization is terminated or revoked.  Performed at [facility], [address], [place],  [address]          Radiology Studies: No results found.      Scheduled Meds:  aspirin EC  81 mg Oral Daily   carvedilol  3.125 mg Oral BID WC    enoxaparin (LOVENOX) injection  40 mg Subcutaneous Q24H   furosemide  60 mg Intravenous TID AC   potassium chloride  30 mEq Oral Daily   rosuvastatin  20 mg Oral Daily   sacubitril-valsartan  1 tablet Oral BID   sodium chloride flush  3 mL Intravenous Q12H   Continuous Infusions:  sodium chloride       LOS: 6 days    Time spent: 15 minutes    Sidney Ace, MD [facility] Pager [phone]  If 7PM-7AM, please contact night-coverage 05/25/2021, 1:00 PM

## 2021-05-25 NOTE — Progress Notes (Addendum)
Progress Note  Patient Name: Anita [name] Date of Encounter: 05/25/2021  Primary Cardiologist: Buford Dresser, MD  Subjective   Says she is feeling better but at the same time does not feel as though her breathing has improved very much.  Good output yesterday.  No chest pain.  Inpatient Medications    Scheduled Meds:  aspirin EC  81 mg Oral Daily   carvedilol  3.125 mg Oral BID WC   enoxaparin (LOVENOX) injection  40 mg Subcutaneous Q24H   furosemide  60 mg Intravenous BID   losartan  12.5 mg Oral Daily   potassium chloride  30 mEq Oral Daily   rosuvastatin  20 mg Oral Daily   sodium chloride flush  3 mL Intravenous Q12H   Continuous Infusions:  sodium chloride     PRN Meds: sodium chloride, acetaminophen **OR** acetaminophen, albuterol, HYDROcodone-acetaminophen, nitroGLYCERIN, ondansetron **OR** ondansetron (ZOFRAN) IV, senna-docusate, sodium chloride flush   Vital Signs    Vitals:   05/24/21 1925 05/25/21 0428 05/25/21 0539 05/25/21 0727  BP: 114/66 136/77  138/85  Pulse: 72 74  70  Resp: 16 16  18   Temp: 97.6 F (36.4 C) (!) 97.3 F (36.3 C)  97.7 F (36.5 C)  TempSrc:      SpO2: 100% 100%  100%  Weight:   71.7 kg   Height:        Intake/Output Summary (Last 24 hours) at 05/25/2021 0842 Last data filed at 05/25/2021 0727 Gross per 24 hour  Intake 970 ml  Output 4000 ml  Net -3030 ml   Filed Weights   05/24/21 0100 05/24/21 0318 05/25/21 0539  Weight: 75.1 kg 75.1 kg 71.7 kg    Physical Exam   GEN: Well nourished, well developed, in no acute distress.  HEENT: Grossly normal.  Neck: Supple, JVD to jaw.  No carotid bruits, or masses. Cardiac: RRR, 3/6 systolic murmur heard throughout and loudest at the left lower sternal border to apex.  No rubs, or gallops. No clubbing, cyanosis, 2+ bilateral lower extremity edema which extends to the posterior hips.  Radials 2+, DP/PT 2+ and equal bilaterally.  Respiratory:  Respirations regular and  unlabored, diminished breath sounds throughout. GI: Soft, nontender, nondistended, BS + x 4.  1-2+ abdominal wall/flank edema noted. MS: no deformity or atrophy. Skin: warm and dry, no rash. Neuro:  Strength and sensation are intact. Psych: AAOx3.  Normal affect.  Labs    Chemistry Recent Labs  Lab 05/18/21 2205 05/20/21 0427 05/23/21 0424 05/24/21 0435 05/25/21 0743  NA 130*   < > 132* 130* 132*  K 4.0   < > 3.9 4.6 4.7  CL 95*   < > 89* 88* 85*  CO2 26   < > 34* 36* 39*  GLUCOSE 93   < > 92 94 84  BUN 26*   < > 24* 24* 21*  CREATININE 0.95   < > 1.03* 1.18* 1.17*  CALCIUM 9.0   < > 8.4* 8.5* 8.7*  PROT 6.6  --   --   --   --   ALBUMIN 3.4*  --   --   --   --   AST 37  --   --   --   --   ALT 16  --   --   --   --   ALKPHOS 94  --   --   --   --   BILITOT 1.7*  --   --   --   --  GFRNONAA >60   < > >60 54* 54*  ANIONGAP 9   < > 9 6 8    < > = values in this interval not displayed.     Hematology Recent Labs  Lab 05/18/21 2205 05/20/21 0427 05/21/21 0513  WBC 11.7* 8.3 8.5  RBC 3.74* 3.44* 3.74*  HGB 10.6* 9.8* 10.6*  HCT 33.1* 31.2* 34.3*  MCV 88.5 90.7 91.7  MCH 28.3 28.5 28.3  MCHC 32.0 31.4 30.9  RDW 16.3* 16.5* 16.9*  PLT 310 224 270    Cardiac Enzymes  Recent Labs  Lab 05/18/21 2205 05/18/21 2356  TROPONINIHS 20* 18*      BNP Recent Labs  Lab 05/18/21 2205  BNP 4,147.9*    Lipids  Lab Results  Component Value Date   CHOL 104 01/08/2021   HDL 40.20 01/08/2021   LDLCALC 51 01/08/2021   TRIG 66.0 01/08/2021   CHOLHDL 3 01/08/2021   Radiology    No results found.  Telemetry    RSR, PVCs - Personally Reviewed  Cardiac Studies   Cardiac Catheterization  4.16.2021    2D Echocardiogram 3.3.2022   1. Left ventricular ejection fraction, by estimation, is 40 to 45%. Left  ventricular ejection fraction by 3D volume is 47 %. The left ventricle has  mildly decreased function. The left ventricle demonstrates global  hypokinesis.  Left ventricular diastolic   parameters are consistent with Grade II diastolic dysfunction  (pseudonormalization). Elevated left ventricular end-diastolic pressure.   2. Right ventricular systolic function is severely reduced. The right  ventricular size is mildly enlarged. There is severely elevated pulmonary  artery systolic pressure. The estimated right ventricular systolic  pressure is 29.7 mmHg.   3. Left atrial size was moderately dilated.   4. Right atrial size was severely dilated.   5. The mitral valve is degenerative with mild to moderate thickening of  the mitral valve leaflets. There is teathering of the MV leaflets due to  mild LV dysfunction. Severe mitral valve regurgitation. Mild to moderate  mitral stenosis. The mean mitral  valve gradient is 6.5 mmHg.   6. Tricuspid valve regurgitation is moderate to severe.   7. The aortic valve is tricuspid. Aortic valve regurgitation is mild.  Mild aortic valve sclerosis is present, with no evidence of aortic valve  stenosis. Aortic regurgitation PHT measures 499 msec.   8. The inferior vena cava is dilated in size with <50% respiratory  variability, suggesting right atrial pressure of 15 mmHg.   9. There is a trivial pericardial effusion posterior to the left  ventricle.  10. Compared to prior echo, Mitral regurgitation appears worse and RV  dysfunction is now present. There is severe Pulmonary HTN.   Patient Profile     60 year old female with a history of medically managed multivessel CAD, chronic combined systolic and diastolic congestive heart failure, ischemic cardiomyopathy, moderate to severe mitral regurgitation with rheumatic appearing mitral valve (without severe mitral stenosis) pulmonary hypertension, multiple sclerosis, COPD, anemia, prior tobacco abuse, and chronic dyspnea, who was admitted June 11 with volume overload/heart failure.  Assessment & Plan    1.  Acute on chronic hypoxic respiratory failure:  Multifactorial in the setting of CHF, pulmonary hypertension, COPD, and moderate to severe mitral regurgitation.  She has been diuresing well but remains about 10 pounds above previous dry weight, and continues to have lower extremity and abdominal flank edema.  Fortunately, renal function remains stable.  Continue IV diuresis.  See #2.  2.  Acute on  chronic combined systolic and diastolic congestive heart failure/pulmonary arterial hypertension: EF 40-45% by echo in March 2022, with grade 2 diastolic dysfunction, RVSP of 69.2 mmHg, severe mitral regurgitation, and mild to moderate mitral stenosis.  Diffuse anasarca/CHF on admission.  She has been responding well to intravenous Lasix and renal function has been stable.  She is -3.7 L yesterday and -12.7 L since admission.  Weight is down 3.4 kg since yesterday-currently 71.7 kg.  Previous dry weight appears to be somewhere around 67 kg.  On exam, she continues to have lower extremity and abdominal flank edema.  Renal function stable.  Continue IV diuresis and will escalate to 60 mg TID.  Pressures have been trending in the 1 teens to the 130s.  She is 138/85 this morning.  She would likely benefit from transition from losartan to Entresto-we will make this change today, starting at the lowest dose.  Going forward, we can consider addition of spironolactone and SGLT2 inhibitor.   6 mos wt trend  3.  Moderate to severe mitral regurgitation: Noted to be worsened on echo in March.  Suspect will improve with continued diuresis.  Repeat echocardiogram as outpatient.?  Candidacy for MitraClip in the future.  4.  Coronary artery disease: Status post diagnostic catheterization April 2021 with a chronic total occlusion of the right coronary artery and moderate, diffuse disease in the left coronary tree with left-to-right collaterals.  She has been medically managed.  No chest pain.  She remains on aspirin, statin, and beta-blocker therapy.  5.   Debility/deconditioning: High fall risk.  Refusing rehab.  6.  Hyperlipidemia: Continue statin therapy.  LDL 51 in February of this year.  Signed, Murray Hodgkins, NP  05/25/2021, 8:42 AM    For questions or updates, please contact   Please consult [email or web address] for contact info under Cardiology/STEMI.

## 2021-05-25 NOTE — Plan of Care (Signed)
°  Problem: Clinical Measurements: °Goal: Respiratory complications will improve °Outcome: Progressing °  °Problem: Clinical Measurements: °Goal: Cardiovascular complication will be avoided °Outcome: Progressing °  °Problem: Pain Managment: °Goal: General experience of comfort will improve °Outcome: Progressing °  °Problem: Safety: °Goal: Ability to remain free from injury will improve °Outcome: Progressing °  °

## 2021-05-26 DIAGNOSIS — R601 Generalized edema: Secondary | ICD-10-CM | POA: Diagnosis not present

## 2021-05-26 DIAGNOSIS — J432 Centrilobular emphysema: Secondary | ICD-10-CM

## 2021-05-26 DIAGNOSIS — I5043 Acute on chronic combined systolic (congestive) and diastolic (congestive) heart failure: Secondary | ICD-10-CM | POA: Diagnosis not present

## 2021-05-26 DIAGNOSIS — I2510 Atherosclerotic heart disease of native coronary artery without angina pectoris: Secondary | ICD-10-CM | POA: Diagnosis not present

## 2021-05-26 LAB — CBC WITH DIFFERENTIAL/PLATELET
Abs Immature Granulocytes: 0.02 10*3/uL (ref 0.00–0.07)
Basophils Absolute: 0.1 10*3/uL (ref 0.0–0.1)
Basophils Relative: 1 %
Eosinophils Absolute: 0.2 10*3/uL (ref 0.0–0.5)
Eosinophils Relative: 3 %
HCT: 36.2 % (ref 36.0–46.0)
Hemoglobin: 11.4 g/dL — ABNORMAL LOW (ref 12.0–15.0)
Immature Granulocytes: 0 %
Lymphocytes Relative: 8 %
Lymphs Abs: 0.6 10*3/uL — ABNORMAL LOW (ref 0.7–4.0)
MCH: 28.1 pg (ref 26.0–34.0)
MCHC: 31.5 g/dL (ref 30.0–36.0)
MCV: 89.4 fL (ref 80.0–100.0)
Monocytes Absolute: 1.1 10*3/uL — ABNORMAL HIGH (ref 0.1–1.0)
Monocytes Relative: 16 %
Neutro Abs: 5 10*3/uL (ref 1.7–7.7)
Neutrophils Relative %: 72 %
Platelets: 201 10*3/uL (ref 150–400)
RBC: 4.05 MIL/uL (ref 3.87–5.11)
RDW: 16.8 % — ABNORMAL HIGH (ref 11.5–15.5)
WBC: 7 10*3/uL (ref 4.0–10.5)
nRBC: 0 % (ref 0.0–0.2)

## 2021-05-26 LAB — BASIC METABOLIC PANEL
Anion gap: 9 (ref 5–15)
BUN: 19 mg/dL (ref 6–20)
CO2: 38 mmol/L — ABNORMAL HIGH (ref 22–32)
Calcium: 8.4 mg/dL — ABNORMAL LOW (ref 8.9–10.3)
Chloride: 86 mmol/L — ABNORMAL LOW (ref 98–111)
Creatinine, Ser: 1.12 mg/dL — ABNORMAL HIGH (ref 0.44–1.00)
GFR, Estimated: 57 mL/min — ABNORMAL LOW (ref >60)
Glucose, Bld: 88 mg/dL (ref 70–99)
Potassium: 3.9 mmol/L (ref 3.5–5.1)
Sodium: 133 mmol/L — ABNORMAL LOW (ref 135–145)

## 2021-05-26 LAB — MAGNESIUM: Magnesium: 1.9 mg/dL (ref 1.7–2.4)

## 2021-05-26 MED ORDER — SPIRONOLACTONE 25 MG PO TABS
25.0000 mg | ORAL_TABLET | Freq: Every day | ORAL | Status: DC
  Administered 2021-05-26 – 2021-05-28 (×3): 25 mg via ORAL
  Filled 2021-05-26 (×3): qty 1

## 2021-05-26 NOTE — Progress Notes (Signed)
Progress Note  Patient Name: Anita [name] Date of Encounter: 05/26/2021  Primary Cardiologist: Buford Dresser, MD  Subjective   Feels that breathing is about the same.  No dyspnea @ rest.  Not interested in getting up to chair.  Inpatient Medications    Scheduled Meds:  aspirin EC  81 mg Oral Daily   carvedilol  3.125 mg Oral BID WC   enoxaparin (LOVENOX) injection  40 mg Subcutaneous Q24H   furosemide  60 mg Intravenous TID AC   potassium chloride  30 mEq Oral Daily   rosuvastatin  20 mg Oral Daily   sacubitril-valsartan  1 tablet Oral BID   sodium chloride flush  3 mL Intravenous Q12H   spironolactone  25 mg Oral Daily   Continuous Infusions:  sodium chloride     PRN Meds: sodium chloride, acetaminophen **OR** acetaminophen, albuterol, HYDROcodone-acetaminophen, nitroGLYCERIN, ondansetron **OR** ondansetron (ZOFRAN) IV, senna-docusate, sodium chloride flush   Vital Signs    Vitals:   05/25/21 2121 05/26/21 0445 05/26/21 0727 05/26/21 1116  BP: 107/61 126/74 124/76 114/69  Pulse: 61 68 77 66  Resp:  16 18 18   Temp:  97.7 F (36.5 C) 98.1 F (36.7 C) 97.7 F (36.5 C)  TempSrc:    Oral  SpO2:  100% 96% 100%  Weight:      Height:        Intake/Output Summary (Last 24 hours) at 05/26/2021 1154 Last data filed at 05/26/2021 1116 Gross per 24 hour  Intake 360 ml  Output 5000 ml  Net -4640 ml   Filed Weights   05/24/21 0100 05/24/21 0318 05/25/21 0539  Weight: 75.1 kg 75.1 kg 71.7 kg    Physical Exam   GEN: Well nourished, well developed, in no acute distress.  HEENT: Grossly normal.  Neck: Supple, JVP to jaw, no carotid bruits, or masses. Cardiac: RRR, 3/6 syst murmur throughout.  No rubs, or gallops. No clubbing, cyanosis, 1-2+ bilat LE edema - predominantly noted in posterior thighs, buttocks, abd flanks.  Radials 2+, DP/PT 2+ and equal bilaterally.  Respiratory:  Respirations regular and unlabored, diminished breath sounds bilat. GI:  Soft, nontender, nondistended, BS + x 4. 1-2+ abdominal flank edema. MS: no deformity or atrophy. Skin: warm and dry, no rash. Neuro:  Strength and sensation are intact. Psych: AAOx3.  Normal affect.  Labs    Chemistry Recent Labs  Lab 05/24/21 0435 05/25/21 0743 05/26/21 0832  NA 130* 132* 133*  K 4.6 4.7 3.9  CL 88* 85* 86*  CO2 36* 39* 38*  GLUCOSE 94 84 88  BUN 24* 21* 19  CREATININE 1.18* 1.17* 1.12*  CALCIUM 8.5* 8.7* 8.4*  GFRNONAA 54* 54* 57*  ANIONGAP 6 8 9      Hematology Recent Labs  Lab 05/20/21 0427 05/21/21 0513 05/26/21 0832  WBC 8.3 8.5 7.0  RBC 3.44* 3.74* 4.05  HGB 9.8* 10.6* 11.4*  HCT 31.2* 34.3* 36.2  MCV 90.7 91.7 89.4  MCH 28.5 28.3 28.1  MCHC 31.4 30.9 31.5  RDW 16.5* 16.9* 16.8*  PLT 224 270 201    Cardiac Enzymes  Recent Labs  Lab 05/18/21 2205 05/18/21 2356  TROPONINIHS 20* 18*     Lipids  Lab Results  Component Value Date   CHOL 104 01/08/2021   HDL 40.20 01/08/2021   LDLCALC 51 01/08/2021   TRIG 66.0 01/08/2021   CHOLHDL 3 01/08/2021   Radiology    No results found.  Telemetry    RSR, PVCs - Personally  Reviewed  Cardiac Studies    Cardiac Catheterization  4.16.2021  Medically managed      2D Echocardiogram 3.3.2022    1. Left ventricular ejection fraction, by estimation, is 40 to 45%. Left  ventricular ejection fraction by 3D volume is 47 %. The left ventricle has  mildly decreased function. The left ventricle demonstrates global  hypokinesis. Left ventricular diastolic   parameters are consistent with Grade II diastolic dysfunction  (pseudonormalization). Elevated left ventricular end-diastolic pressure.   2. Right ventricular systolic function is severely reduced. The right  ventricular size is mildly enlarged. There is severely elevated pulmonary  artery systolic pressure. The estimated right ventricular systolic  pressure is 14.4 mmHg.   3. Left atrial size was moderately dilated.   4. Right atrial  size was severely dilated.   5. The mitral valve is degenerative with mild to moderate thickening of  the mitral valve leaflets. There is teathering of the MV leaflets due to  mild LV dysfunction. Severe mitral valve regurgitation. Mild to moderate  mitral stenosis. The mean mitral  valve gradient is 6.5 mmHg.   6. Tricuspid valve regurgitation is moderate to severe.   7. The aortic valve is tricuspid. Aortic valve regurgitation is mild.  Mild aortic valve sclerosis is present, with no evidence of aortic valve  stenosis. Aortic regurgitation PHT measures 499 msec.   8. The inferior vena cava is dilated in size with <50% respiratory  variability, suggesting right atrial pressure of 15 mmHg.   9. There is a trivial pericardial effusion posterior to the left  ventricle.  10. Compared to prior echo, Mitral regurgitation appears worse and RV  dysfunction is now present. There is severe Pulmonary HTN.  Patient Profile     60 year old female with a history of medically managed multivessel CAD, chronic combined systolic and diastolic congestive heart failure, ischemic cardiomyopathy, moderate to severe mitral regurgitation with rheumatic appearing mitral valve (without severe mitral stenosis) pulmonary hypertension, multiple sclerosis, COPD, anemia, prior tobacco abuse, and chronic dyspnea, who was admitted June 11 with volume overload/heart failure.  Assessment & Plan    1.  Acute on chronic hypoxic respiratory failure: Multifactorial in the setting of CHF, pulmonary hypertension, COPD, and moderate to severe mitral regurgitation.  She continues to diurese well but does have ongoing lower extremity and abdominal edema.  Continue diuresis.  See #2.  2.  Acute on chronic mild systolic and diastolic congestive heart failure/pulmonary arterial hypertension: EF 40-45% by echo in March 2022, with grade 2 diastolic dysfunction, RVSP of 69.2 mmHg, and severe mitral regurgitation, with mild to moderate  mitral stenosis.  Diffuse anasarca/CHF on admission.  She continues to respond well to intravenous Lasix.  Minus 3.6L yesterday, minus 16.6L since admission.  Wt pending this AM.  Previous dry weight appears to be somewhere around 67 kg.  Volume status improving, though still w/ evidence of abdominal and lower ext edema, particularly in posterior portions of legs/buttocks/abd/back.  Encouraged her to sit in chair @ bedside w/ assistance, though she replied, "leave me alone."  BUN/Creat stable, though bicarb is rising, now 39.  Cont IV diuresis today.  Continue beta-blocker and Entresto.  Adding spironolactone given stable pressures.  Will consider addition of SGLT2 inhibitor next if felt to be financially feasible.  3.  Moderate to severe mod regurgitation: Noted to be worsened on echo in March.  Suspect will improve with diuresis.  Will need repeat echo as outpatient.?  Candidacy for MitraClip in the future.  4.  Coronary artery disease: Status post diagnostic catheterization April 2021 showing a chronic total occlusion of the RCA and moderate diffuse disease in the left coronary tree with left-to-right collaterals.  She has been medically managed and without chest pain.  She remains on aspirin, statin, and beta-blocker.  5.  Debility/deconditioning: High fall risk.  Refusing rehab.  Encouraged that she get up to chair.  Discussed with nursing staff.  6.  Hyperlipidemia: LDL 51 in February of this year.  Continue statin therapy.  Signed, Murray Hodgkins, NP  05/26/2021, 11:54 AM    For questions or updates, please contact   Please consult [email or web address] for contact info under Cardiology/STEMI.

## 2021-05-26 NOTE — Progress Notes (Signed)
PROGRESS NOTE    Anita [name]  AYT:[id number] DOB: 1961/12/02 DOA: 05/18/2021 PCP: [name], NP    Brief Narrative:  60 year old female with history of multiple sclerosis, COPD, CAD, chronic combined CHF, last EF 40 to 45% 02/2021, ischemic cardiomyopathy, severe mitral regurgitation with limited surgical options, severe pulmonary hypertension, previously on home O2, HTN, ex smoker, presenting with anasarca.    Patient has been diuresing effectively on Lasix 60 mg IV twice daily.  She remains significantly anasarcic.  Diuresing well.  6 L net negative over admission.  Patient has quite a bit of a dependent edema that likely is related to her immobility.  Therapy recommends a skilled nursing facility however after my conversations with the daughter she is refusing SNF placement due to previous bad experiences.  The patient will need to mobilize as much as possible otherwise will be very difficult to get her back to dry weight even with aggressive diuresis   Assessment & Plan:   Principal Problem:   Acute on chronic combined systolic and diastolic CHF (congestive heart failure) (HCC) Active Problems:   Multiple sclerosis (HCC)   Essential hypertension   Pulmonary hypertension (HCC)   COPD (chronic obstructive pulmonary disease) ([place])   Ischemic cardiomyopathy   Coronary artery disease involving native coronary artery of native heart without angina pectoris   Mitral valve insufficiency   UTI (urinary tract infection)   CHF (congestive heart failure) (HCC)   Hypoxia   Anasarca  Anasarca   Acute hypoxic respiratory failure due to CHF exacerbation   Acute on chronic combined systolic and diastolic CHF   Ischemic cardiomyopathy   Severe mitral regurgitation - Last echo on file from March 2022 with grade 2 diastolic dysfunction EF 40 to 45% with severe mitral regurg - On admission BNP 4147 with troponin trending down, EKG nonacute and no chest pain - Suspect related  to severe mitral regurg as well as severe pulmonary hypertension -- Diuresing well, kidney function stable, 18 L net negative Plan: Kidney function remains stable however bicarbonate is increasing.  Continue Lasix 60 mg IV 3 times daily for today.  Repeat BMP in a.m. and reassess diuretic     Multiple sclerosis (HCC) - stable     Essential hypertension - Continue Coreg and losartan   Hypoxia Pulmonary hypertension (HCC) COPD (chronic obstructive pulmonary disease) (HCC) - O2 sat 89% on room air requiring 2 L O2 - Patient was previously on home O2, not currently.  May need home O2 eval prior to discharge. will need oxygen qualification - DuoNebs as needed   CAD - Troponin trending down with nonacute EKG and no chest pain - Continue aspirin, rosuvastatin and Coreg   Possible UTI (urinary tract infection) - Urinalysis with large LE and many bacteria but no urinary symptoms or fever.  No leukocytosis - It appears no culture was sent on the original UA -Patient not having any urinary symptoms -No indication for antibiotics at this time   DVT prophylaxis: SQ Lovenox Code Status: Full Family Communication: Daughter [name] [phone] on 6/19 Disposition Plan: Status is: Inpatient  Remains inpatient appropriate because:Inpatient level of care appropriate due to severity of illness  Dispo: The patient is from: Home              Anticipated d/c is to: Home with home health              Patient currently is not medically stable to d/c.   Difficult to place patient No  18 L net negative.  Fluid overload improving.  Large amounts of dependent edema.  Possible medical readiness within the next 24 to 48 hours.     Level of care: Progressive Cardiac  Consultants:  Cardiology  Procedures:  None  Antimicrobials:  None   Subjective: Patient seen and examined.  Lying in bed.  No visible distress.  Objective: Vitals:   05/25/21 2121 05/26/21 0445 05/26/21 0727 05/26/21  1116  BP: 107/61 126/74 124/76 114/69  Pulse: 61 68 77 66  Resp:  16 18 18   Temp:  97.7 F (36.5 C) 98.1 F (36.7 C) 97.7 F (36.5 C)  TempSrc:    Oral  SpO2:  100% 96% 100%  Weight:      Height:        Intake/Output Summary (Last 24 hours) at 05/26/2021 1138 Last data filed at 05/26/2021 1116 Gross per 24 hour  Intake 360 ml  Output 5000 ml  Net -4640 ml   Filed Weights   05/24/21 0100 05/24/21 0318 05/25/21 0539  Weight: 75.1 kg 75.1 kg 71.7 kg    Examination:  General exam: No apparent distress Respiratory system: Scattered crackles bilaterally.  Positive JVD.  Normal work of breathing.  Room air Cardiovascular system: G9-Q1, systolic murmur, significant pitting edema bilateral upper and lower extremities Gastrointestinal system: Abdomen is nondistended, soft and nontender. No organomegaly or masses felt. Normal bowel sounds heard. Central nervous system: Alert and oriented. No focal neurological deficits. Extremities: Symmetric 5 x 5 power. Skin: No rashes, lesions or ulcers Psychiatry: Judgement and insight appear normal. Mood & affect appropriate.     Data Reviewed: I have personally reviewed following labs and imaging studies  CBC: Recent Labs  Lab 05/20/21 0427 05/21/21 0513 05/26/21 0832  WBC 8.3 8.5 7.0  NEUTROABS  --   --  5.0  HGB 9.8* 10.6* 11.4*  HCT 31.2* 34.3* 36.2  MCV 90.7 91.7 89.4  PLT 224 270 194   Basic Metabolic Panel: Recent Labs  Lab 05/22/21 0618 05/23/21 0424 05/24/21 0435 05/25/21 0743 05/26/21 0832  NA 135 132* 130* 132* 133*  K 4.3 3.9 4.6 4.7 3.9  CL 92* 89* 88* 85* 86*  CO2 35* 34* 36* 39* 38*  GLUCOSE 88 92 94 84 88  BUN 26* 24* 24* 21* 19  CREATININE 1.05* 1.03* 1.18* 1.17* 1.12*  CALCIUM 8.6* 8.4* 8.5* 8.7* 8.4*  MG  --   --   --   --  1.9   GFR: Estimated Creatinine Clearance: 51.9 mL/min (A) (by C-G formula based on SCr of 1.12 mg/dL (H)). Liver Function Tests: No results for input(s): AST, ALT, ALKPHOS,  BILITOT, PROT, ALBUMIN in the last 168 hours.  No results for input(s): LIPASE, AMYLASE in the last 168 hours. No results for input(s): AMMONIA in the last 168 hours. Coagulation Profile: No results for input(s): INR, PROTIME in the last 168 hours. Cardiac Enzymes: No results for input(s): CKTOTAL, CKMB, CKMBINDEX, TROPONINI in the last 168 hours. BNP (last 3 results) No results for input(s): PROBNP in the last 8760 hours. HbA1C: No results for input(s): HGBA1C in the last 72 hours. CBG: Recent Labs  Lab 05/19/21 2254  GLUCAP 100*   Lipid Profile: No results for input(s): CHOL, HDL, LDLCALC, TRIG, CHOLHDL, LDLDIRECT in the last 72 hours. Thyroid Function Tests: No results for input(s): TSH, T4TOTAL, FREET4, T3FREE, THYROIDAB in the last 72 hours. Anemia Panel: No results for input(s): VITAMINB12, FOLATE, FERRITIN, TIBC, IRON, RETICCTPCT in the last 72 hours.  Sepsis Labs: No results for input(s): PROCALCITON, LATICACIDVEN in the last 168 hours.   Recent Results (from the past 240 hour(s))  Resp Panel by RT-PCR (Flu A&B, Covid) Nasopharyngeal Swab     Status: None   Collection Time: 05/18/21 10:05 PM   Specimen: Nasopharyngeal Swab; Nasopharyngeal(NP) swabs in vial transport medium  Result Value Ref Range Status   SARS Coronavirus 2 by RT PCR NEGATIVE NEGATIVE Final    Comment: (NOTE) SARS-CoV-2 target nucleic acids are NOT DETECTED.  The SARS-CoV-2 RNA is generally detectable in upper respiratory specimens during the acute phase of infection. The lowest concentration of SARS-CoV-2 viral copies this assay can detect is 138 copies/mL. A negative result does not preclude SARS-Cov-2 infection and should not be used as the sole basis for treatment or other patient management decisions. A negative result may occur with  improper specimen collection/handling, submission of specimen other than nasopharyngeal swab, presence of viral mutation(s) within the areas targeted by this  assay, and inadequate number of viral copies(<138 copies/mL). A negative result must be combined with clinical observations, patient history, and epidemiological information. The expected result is Negative.  Fact Sheet for Patients:  EntrepreneurPulse.com.au  Fact Sheet for Healthcare Providers:  IncredibleEmployment.be  This test is no t yet approved or cleared by the [place] FDA and  has been authorized for detection and/or diagnosis of SARS-CoV-2 by FDA under an Emergency Use Authorization (EUA). This EUA will remain  in effect (meaning this test can be used) for the duration of the COVID-19 declaration under Section 564(b)(1) of the Act, 21 U.S.C.section 360bbb-3(b)(1), unless the authorization is terminated  or revoked sooner.       Influenza A by PCR NEGATIVE NEGATIVE Final   Influenza B by PCR NEGATIVE NEGATIVE Final    Comment: (NOTE) The Xpert Xpress SARS-CoV-2/FLU/RSV plus assay is intended as an aid in the diagnosis of influenza from Nasopharyngeal swab specimens and should not be used as a sole basis for treatment. Nasal washings and aspirates are unacceptable for Xpert Xpress SARS-CoV-2/FLU/RSV testing.  Fact Sheet for Patients: EntrepreneurPulse.com.au  Fact Sheet for Healthcare Providers: IncredibleEmployment.be  This test is not yet approved or cleared by the [place] FDA and has been authorized for detection and/or diagnosis of SARS-CoV-2 by FDA under an Emergency Use Authorization (EUA). This EUA will remain in effect (meaning this test can be used) for the duration of the COVID-19 declaration under Section 564(b)(1) of the Act, 21 U.S.C. section 360bbb-3(b)(1), unless the authorization is terminated or revoked.  Performed at [facility], [address], [place], [place] [address]          Radiology Studies: No results found.      Scheduled Meds:   aspirin EC  81 mg Oral Daily   carvedilol  3.125 mg Oral BID WC   enoxaparin (LOVENOX) injection  40 mg Subcutaneous Q24H   furosemide  60 mg Intravenous TID AC   potassium chloride  30 mEq Oral Daily   rosuvastatin  20 mg Oral Daily   sacubitril-valsartan  1 tablet Oral BID   sodium chloride flush  3 mL Intravenous Q12H   spironolactone  25 mg Oral Daily   Continuous Infusions:  sodium chloride       LOS: 7 days    Time spent: 15 minutes    Sidney Ace, MD [facility] Pager [phone]  If 7PM-7AM, please contact night-coverage 05/26/2021, 11:38 AM

## 2021-05-27 DIAGNOSIS — I5043 Acute on chronic combined systolic (congestive) and diastolic (congestive) heart failure: Secondary | ICD-10-CM | POA: Diagnosis not present

## 2021-05-27 DIAGNOSIS — I2510 Atherosclerotic heart disease of native coronary artery without angina pectoris: Secondary | ICD-10-CM | POA: Diagnosis not present

## 2021-05-27 DIAGNOSIS — R601 Generalized edema: Secondary | ICD-10-CM | POA: Diagnosis not present

## 2021-05-27 DIAGNOSIS — J432 Centrilobular emphysema: Secondary | ICD-10-CM | POA: Diagnosis not present

## 2021-05-27 LAB — BASIC METABOLIC PANEL
Anion gap: 8 (ref 5–15)
BUN: 18 mg/dL (ref 6–20)
CO2: 41 mmol/L — ABNORMAL HIGH (ref 22–32)
Calcium: 8.7 mg/dL — ABNORMAL LOW (ref 8.9–10.3)
Chloride: 86 mmol/L — ABNORMAL LOW (ref 98–111)
Creatinine, Ser: 1.17 mg/dL — ABNORMAL HIGH (ref 0.44–1.00)
GFR, Estimated: 54 mL/min — ABNORMAL LOW (ref >60)
Glucose, Bld: 88 mg/dL (ref 70–99)
Potassium: 4.2 mmol/L (ref 3.5–5.1)
Sodium: 135 mmol/L (ref 135–145)

## 2021-05-27 MED ORDER — DAPAGLIFLOZIN PROPANEDIOL 5 MG PO TABS
10.0000 mg | ORAL_TABLET | Freq: Every day | ORAL | Status: DC
  Administered 2021-05-27 – 2021-05-28 (×2): 10 mg via ORAL
  Filled 2021-05-27 (×2): qty 2

## 2021-05-27 NOTE — Progress Notes (Signed)
Pt is A&O, VS stable, NSR on the monitor. Complained of headache this am after wetting the bed and getting a bath, given with relief. Urine o/p is great tonight. Bed changed x2 due to purwick malfunction. Called out multiple times for repositioning.

## 2021-05-27 NOTE — Progress Notes (Signed)
PROGRESS NOTE    CHERA SLIVKA  DEY:[id number] DOB: [birth year] DOA: 05/18/2021 PCP: [name], NP    Brief Narrative:  60 year old female with history of multiple sclerosis, COPD, CAD, chronic combined CHF, last EF 40 to 45% 02/2021, ischemic cardiomyopathy, severe mitral regurgitation with limited surgical options, severe pulmonary hypertension, previously on home O2, HTN, ex smoker, presenting with anasarca.    Patient has been diuresing effectively on Lasix 60 mg IV twice daily.  She remains significantly anasarcic.  Diuresing well.  6 L net negative over admission.  Patient has quite a bit of a dependent edema that likely is related to her immobility.  Therapy recommends a skilled nursing facility however after my conversations with the daughter she is refusing SNF placement due to previous bad experiences.  The patient will need to mobilize as much as possible otherwise will be very difficult to get her back to dry weight even with aggressive diuresis  As of 6/20 patient is approaching dry weight.  She is 20 L net negative since admission.  Case discussed with cardiology.  Plan for 1 additional day of IV diuresis with plans to transition to oral diuretics on 6/21   Assessment & Plan:   Principal Problem:   Acute on chronic combined systolic and diastolic CHF (congestive heart failure) (HCC) Active Problems:   Multiple sclerosis (HCC)   Essential hypertension   Pulmonary hypertension (HCC)   COPD (chronic obstructive pulmonary disease) ([place])   Ischemic cardiomyopathy   Coronary artery disease involving native coronary artery of native heart without angina pectoris   Mitral valve insufficiency   UTI (urinary tract infection)   CHF (congestive heart failure) (HCC)   Hypoxia   Anasarca  Anasarca   Acute hypoxic respiratory failure due to CHF exacerbation   Acute on chronic combined systolic and diastolic CHF   Ischemic cardiomyopathy   Severe mitral  regurgitation - Last echo on file from March 2022 with grade 2 diastolic dysfunction EF 40 to 45% with severe mitral regurg - On admission BNP 4147 with troponin trending down, EKG nonacute and no chest pain - Suspect related to severe mitral regurg as well as severe pulmonary hypertension -- Diuresing well, kidney function stable, 20 L net negative Plan: Kidney function remains stable however bicarbonate is increasing.   Continue IV Lasix 60 3 times daily for today Plan to repeat BMP and transition to oral diuretics tomorrow      Multiple sclerosis ([place]) - Patient is immobile at baseline and does not seem motivated to move.  Stressed that she needs to exert herself to avoid development of dependent edema post discharge.     Essential hypertension - Continue Coreg and losartan   Hypoxia Pulmonary hypertension (HCC) COPD (chronic obstructive pulmonary disease) (HCC) - O2 sat 89% on room air requiring 2 L O2 - Patient was previously on home O2, not currently.  May need home O2 eval prior to discharge. will need oxygen qualification - DuoNebs as needed   CAD - Troponin trending down with nonacute EKG and no chest pain - Continue aspirin, rosuvastatin and Coreg   Possible UTI (urinary tract infection) - Urinalysis with large LE and many bacteria but no urinary symptoms or fever.  No leukocytosis - It appears no culture was sent on the original UA -Patient not having any urinary symptoms -No indication for antibiotics at this time   DVT prophylaxis: SQ Lovenox Code Status: Full Family Communication: Daughter [name] [phone] on 6/19 Disposition Plan: Status is:  Inpatient  Remains inpatient appropriate because:Inpatient level of care appropriate due to severity of illness  Dispo: The patient is from: Home              Anticipated d/c is to: Home with home health              Patient currently is not medically stable to d/c.   Difficult to place patient No  20 L net  negative.  Fluid overload finally improving.  Dependent edema resolving.  Hopefully will transition to oral diuretics tomorrow in preparation for discharge     Level of care: Progressive Cardiac  Consultants:  Cardiology  Procedures:  None  Antimicrobials:  None   Subjective: Patient seen and examined.  Lying in bed.  No visible distress.  Objective: Vitals:   05/26/21 2021 05/26/21 2125 05/27/21 0409 05/27/21 1145  BP: (!) 102/57 (!) 109/56 122/62 118/68  Pulse: 73 74 78   Resp: 20 16 20 20   Temp: 97.9 F (36.6 C) 97.7 F (36.5 C) 98.5 F (36.9 C) 98.3 F (36.8 C)  TempSrc: Oral   Oral  SpO2: 100% 100% 100% 100%  Weight:      Height:        Intake/Output Summary (Last 24 hours) at 05/27/2021 1329 Last data filed at 05/27/2021 1025 Gross per 24 hour  Intake 723 ml  Output 3400 ml  Net -2677 ml   Filed Weights   05/24/21 0100 05/24/21 0318 05/25/21 0539  Weight: 75.1 kg 75.1 kg 71.7 kg    Examination:  General exam: No apparent distress Respiratory system: Scattered crackles bilaterally.  Positive JVD.  Normal work of breathing.  Room air Cardiovascular system: Q7-H4, systolic murmur, improved pitting edema bilateral upper and lower extremities and truncal edema  gastrointestinal system: Abdominal wall edema.  Soft, nontender, nondistended, normal bowel sounds Central nervous system: Alert and oriented. No focal neurological deficits. Extremities: Symmetric 5 x 5 power. Skin: No rashes, lesions or ulcers Psychiatry: Judgement and insight appear normal. Mood & affect appropriate.     Data Reviewed: I have personally reviewed following labs and imaging studies  CBC: Recent Labs  Lab 05/21/21 0513 05/26/21 0832  WBC 8.5 7.0  NEUTROABS  --  5.0  HGB 10.6* 11.4*  HCT 34.3* 36.2  MCV 91.7 89.4  PLT 270 193   Basic Metabolic Panel: Recent Labs  Lab 05/23/21 0424 05/24/21 0435 05/25/21 0743 05/26/21 0832 05/27/21 0452  NA 132* 130* 132* 133*  135  K 3.9 4.6 4.7 3.9 4.2  CL 89* 88* 85* 86* 86*  CO2 34* 36* 39* 38* 41*  GLUCOSE 92 94 84 88 88  BUN 24* 24* 21* 19 18  CREATININE 1.03* 1.18* 1.17* 1.12* 1.17*  CALCIUM 8.4* 8.5* 8.7* 8.4* 8.7*  MG  --   --   --  1.9  --    GFR: Estimated Creatinine Clearance: 49.7 mL/min (A) (by C-G formula based on SCr of 1.17 mg/dL (H)). Liver Function Tests: No results for input(s): AST, ALT, ALKPHOS, BILITOT, PROT, ALBUMIN in the last 168 hours.  No results for input(s): LIPASE, AMYLASE in the last 168 hours. No results for input(s): AMMONIA in the last 168 hours. Coagulation Profile: No results for input(s): INR, PROTIME in the last 168 hours. Cardiac Enzymes: No results for input(s): CKTOTAL, CKMB, CKMBINDEX, TROPONINI in the last 168 hours. BNP (last 3 results) No results for input(s): PROBNP in the last 8760 hours. HbA1C: No results for input(s): HGBA1C in the  last 72 hours. CBG: No results for input(s): GLUCAP in the last 168 hours.  Lipid Profile: No results for input(s): CHOL, HDL, LDLCALC, TRIG, CHOLHDL, LDLDIRECT in the last 72 hours. Thyroid Function Tests: No results for input(s): TSH, T4TOTAL, FREET4, T3FREE, THYROIDAB in the last 72 hours. Anemia Panel: No results for input(s): VITAMINB12, FOLATE, FERRITIN, TIBC, IRON, RETICCTPCT in the last 72 hours. Sepsis Labs: No results for input(s): PROCALCITON, LATICACIDVEN in the last 168 hours.   Recent Results (from the past 240 hour(s))  Resp Panel by RT-PCR (Flu A&B, Covid) Nasopharyngeal Swab     Status: None   Collection Time: 05/18/21 10:05 PM   Specimen: Nasopharyngeal Swab; Nasopharyngeal(NP) swabs in vial transport medium  Result Value Ref Range Status   SARS Coronavirus 2 by RT PCR NEGATIVE NEGATIVE Final    Comment: (NOTE) SARS-CoV-2 target nucleic acids are NOT DETECTED.  The SARS-CoV-2 RNA is generally detectable in upper respiratory specimens during the acute phase of infection. The lowest concentration of  SARS-CoV-2 viral copies this assay can detect is 138 copies/mL. A negative result does not preclude SARS-Cov-2 infection and should not be used as the sole basis for treatment or other patient management decisions. A negative result may occur with  improper specimen collection/handling, submission of specimen other than nasopharyngeal swab, presence of viral mutation(s) within the areas targeted by this assay, and inadequate number of viral copies(<138 copies/mL). A negative result must be combined with clinical observations, patient history, and epidemiological information. The expected result is Negative.  Fact Sheet for Patients:  EntrepreneurPulse.com.au  Fact Sheet for Healthcare Providers:  IncredibleEmployment.be  This test is no t yet approved or cleared by the [place] FDA and  has been authorized for detection and/or diagnosis of SARS-CoV-2 by FDA under an Emergency Use Authorization (EUA). This EUA will remain  in effect (meaning this test can be used) for the duration of the COVID-19 declaration under Section 564(b)(1) of the Act, 21 U.S.C.section 360bbb-3(b)(1), unless the authorization is terminated  or revoked sooner.       Influenza A by PCR NEGATIVE NEGATIVE Final   Influenza B by PCR NEGATIVE NEGATIVE Final    Comment: (NOTE) The Xpert Xpress SARS-CoV-2/FLU/RSV plus assay is intended as an aid in the diagnosis of influenza from Nasopharyngeal swab specimens and should not be used as a sole basis for treatment. Nasal washings and aspirates are unacceptable for Xpert Xpress SARS-CoV-2/FLU/RSV testing.  Fact Sheet for Patients: EntrepreneurPulse.com.au  Fact Sheet for Healthcare Providers: IncredibleEmployment.be  This test is not yet approved or cleared by the [place] FDA and has been authorized for detection and/or diagnosis of SARS-CoV-2 by FDA under an Emergency Use  Authorization (EUA). This EUA will remain in effect (meaning this test can be used) for the duration of the COVID-19 declaration under Section 564(b)(1) of the Act, 21 U.S.C. section 360bbb-3(b)(1), unless the authorization is terminated or revoked.  Performed at [facility], [address], [place], [place] [address]          Radiology Studies: No results found.      Scheduled Meds:  aspirin EC  81 mg Oral Daily   carvedilol  3.125 mg Oral BID WC   dapagliflozin propanediol  10 mg Oral Daily   enoxaparin (LOVENOX) injection  40 mg Subcutaneous Q24H   furosemide  60 mg Intravenous TID AC   potassium chloride  30 mEq Oral Daily   rosuvastatin  20 mg Oral Daily   sacubitril-valsartan  1  tablet Oral BID   sodium chloride flush  3 mL Intravenous Q12H   spironolactone  25 mg Oral Daily   Continuous Infusions:  sodium chloride       LOS: 8 days    Time spent: 25 minutes    Sidney Ace, MD [facility] Pager [phone]  If 7PM-7AM, please contact night-coverage 05/27/2021, 1:29 PM

## 2021-05-27 NOTE — Care Management Important Message (Signed)
Important Message  Patient Details  Name: Anita [name] MRN: [id number] Date of Birth: Jul 29, 1961   Medicare Important Message Given:  Yes     Dannette Barbara 05/27/2021, 11:52 AM

## 2021-05-27 NOTE — Progress Notes (Signed)
Occupational Therapy Treatment Patient Details Name: Anita [name] MRN: [id number] DOB: Apr 10, 1961 Today's Date: 05/27/2021    History of present illness Pt admitted for acute/chronic systolic and diastolic CHF with complaints of facial swelling and SOB symptoms x 3 weeks. History includes MS, CHF, COPD, and CAD.   OT comments  Anita [name] was initially reluctant to participate in therapy at all, stating, "why are you people always coming in here telling me what to do?" With encouragement, however, she agreed to bed mobility, sitting EOB, and sit<>stand transfers x 2. She required Mod/Max A to maintain standing balance for even a few seconds, heavy use of bed rails for bed mobility and transfers. Pt was able to provide quite a bit of assistance in scooting towards HOB from supine position. Will continue to offer OT while pt is hospitalized, with goal of getting her to engage in OOB activity. Pt hopes to DC to her daughter's home, but at her current level of functioning have to recommend SNF.    Follow Up Recommendations  SNF    Equipment Recommendations       Recommendations for Other Services      Precautions / Restrictions Precautions Precautions: Fall Restrictions Weight Bearing Restrictions: No       Mobility Bed Mobility Overal bed mobility: Needs Assistance Bed Mobility: Rolling Rolling: Min guard   Supine to sit: Mod assist Sit to supine: Mod assist   General bed mobility comments: heavy use of bed rail. Pt Mod A for scooting in supine towards [facility]    Transfers   Equipment used: 1 person hand held assist Transfers: Sit to/from Stand Sit to Stand: Mod assist         General transfer comment: Required lots of coaxing/encouragment to attempt    Balance Overall balance assessment: Needs assistance Sitting-balance support: Feet supported;Bilateral upper extremity supported Sitting balance-Leahy Scale: Fair     Standing balance support: Bilateral  upper extremity supported Standing balance-Leahy Scale: Poor Standing balance comment: pt able to tolerate static standing ~ 10 seconds                           ADL either performed or assessed with clinical judgement   ADL Overall ADL's : Needs assistance/impaired                     Lower Body Dressing: Independent;Total assistance Lower Body Dressing Details (indicate cue type and reason): Pt IND for donning L sock, Total A for R sock                     Vision Patient Visual Report: No change from baseline     Perception     Praxis      Cognition Arousal/Alertness: Awake/alert Behavior During Therapy: WFL for tasks assessed/performed;Anxious Overall Cognitive Status: Within Functional Limits for tasks assessed                                          Exercises     Shoulder Instructions       General Comments      Pertinent Vitals/ Pain       Faces Pain Scale: Hurts little more Pain Location: lower back Pain Intervention(s): Limited activity within patient's tolerance;Premedicated before session;Repositioned  Home Living  Prior Functioning/Environment              Frequency  Min 2X/week        Progress Toward Goals  OT Goals(current goals can now be found in the care plan section)  Progress towards OT goals: Not progressing toward goals - comment (pt refusing most OOB activity)  Acute Rehab OT Goals Patient Stated Goal: to go home with daughter OT Goal Formulation: With patient Time For Goal Achievement: 06/04/21 Potential to Achieve Goals: [place] Discharge plan remains appropriate;Frequency remains appropriate    Co-evaluation                 AM-PAC OT "6 Clicks" Daily Activity     Outcome Measure   Help from another person eating meals?: None Help from another person taking care of personal grooming?: A Little Help from  another person toileting, which includes using toliet, bedpan, or urinal?: A Lot Help from another person bathing (including washing, rinsing, drying)?: A Lot Help from another person to put on and taking off regular upper body clothing?: A Little Help from another person to put on and taking off regular lower body clothing?: A Lot 6 Click Score: 16    End of Session Equipment Utilized During Treatment: Oxygen  OT Visit Diagnosis: Unsteadiness on feet (R26.81);Muscle weakness (generalized) (M62.81);Repeated falls (R29.6);History of falling (Z91.81)   Activity Tolerance     Patient Left in bed;with call bell/phone within reach;Other (comment) (PT in room)   Nurse Communication          Time: [phone] OT Time Calculation (min): 10 min  Charges: OT General Charges $OT Visit: 1 Visit OT Treatments $Self Care/Home Management : 8-22 mins  Anita Lobo, PhD, MS, OTR/L 05/27/21, 3:30 PM

## 2021-05-27 NOTE — Progress Notes (Signed)
Physical Therapy Treatment Patient Details Name: Anita [name] MRN: [id number] DOB: [birth year] Today's Date: 05/27/2021    History of Present Illness Pt admitted for acute/chronic systolic and diastolic CHF with complaints of facial swelling and SOB symptoms x 3 weeks. History includes MS, CHF, COPD, and CAD.    PT Comments    Patient received sitting up on side of bed with OT present. Patient declines sitting up in recliner. (Got quite agitated about it). She agrees to stand at edge of bed. Required min +2 for safety. Then had to stand again to change bed pad. Patient declines attempting ambulation. She stood briefly and then asks to sit back down. Requires min guard for sit to supine and min assist to scoot up in bed.  Patient performed bed level LE strengthening exercises. Educated patient to continue with exercises on her own. Patient moves fairly well, but requires motivation. She will continue to benefit from skilled PT while here to improve strength and functional independence.         Follow Up Recommendations  Home health PT;Supervision/Assistance - 24 hour     Equipment Recommendations  None recommended by PT    Recommendations for Other Services       Precautions / Restrictions Precautions Precautions: Fall Restrictions Weight Bearing Restrictions: No    Mobility  Bed Mobility Overal bed mobility: Needs Assistance Bed Mobility: Supine to Sit;Sit to Supine Rolling: Min guard   Supine to sit: Min guard Sit to supine: Min guard   General bed mobility comments: heavy use of bed rail. Pt Mod A for scooting in supine towards HOB    Transfers Overall transfer level: Needs assistance Equipment used: 2 person hand held assist Transfers: Sit to/from Stand Sit to Stand: Min assist;+2 safety/equipment         General transfer comment: Required lots of coaxing/encouragment to attempt  Ambulation/Gait             General Gait Details: patient  declines   Stairs             Wheelchair Mobility    Modified Rankin (Stroke Patients Only)       Balance Overall balance assessment: Needs assistance Sitting-balance support: Feet supported Sitting balance-Leahy Scale: Good     Standing balance support: Bilateral upper extremity supported;During functional activity Standing balance-Leahy Scale: Fair Standing balance comment: Patient requires assist for standing                            Cognition Arousal/Alertness: Awake/alert Behavior During Therapy: WFL for tasks assessed/performed Overall Cognitive Status: Within Functional Limits for tasks assessed                                 General Comments: Pt alert and oriented during session. Can get a little agitated at times, but overall pleasant      Exercises Other Exercises Other Exercises: B LE strengthening exercises: Ap, heel slides, hip abd/add, SLR, bridging x 2. No physical assist. Cues for technique    General Comments        Pertinent Vitals/Pain Faces Pain Scale: Hurts little more Pain Location: lower back Pain Descriptors / Indicators: Discomfort Pain Intervention(s): Premedicated before session;Repositioned    Home Living                      Prior Function  PT Goals (current goals can now be found in the care plan section) Acute Rehab PT Goals Patient Stated Goal: to go home with daughter PT Goal Formulation: With patient Time For Goal Achievement: 06/04/21 Potential to Achieve Goals: Good Progress towards PT goals: Progressing toward goals    Frequency    Min 2X/week      PT Plan Discharge plan needs to be updated    Co-evaluation              AM-PAC PT "6 Clicks" Mobility   Outcome Measure  Help needed turning from your back to your side while in a flat bed without using bedrails?: A Little Help needed moving from lying on your back to sitting on the side of a flat bed  without using bedrails?: A Lot Help needed moving to and from a bed to a chair (including a wheelchair)?: A Lot Help needed standing up from a chair using your arms (e.g., wheelchair or bedside chair)?: A Lot Help needed to walk in hospital room?: Total Help needed climbing 3-5 steps with a railing? : Total 6 Click Score: 11    End of Session Equipment Utilized During Treatment: Oxygen Activity Tolerance: Patient tolerated treatment well Patient left: in bed;with bed alarm set;with call bell/phone within reach Nurse Communication: Mobility status PT Visit Diagnosis: Muscle weakness (generalized) (M62.81);Other abnormalities of gait and mobility (R26.89)     Time: 2620-3559 PT Time Calculation (min) (ACUTE ONLY): 20 min  Charges:  $Therapeutic Exercise: 8-22 mins                    Tyr Franca, PT, GCS 05/27/21,3:38 PM

## 2021-05-27 NOTE — Progress Notes (Signed)
Progress Note  Patient Name: Anita [name] Date of Encounter: 05/27/2021  Primary Cardiologist: Buford Dresser, MD  Subjective   Feels that breathing is improving.  Significant improvement in edema over the weekend.  Inpatient Medications    Scheduled Meds:  aspirin EC  81 mg Oral Daily   carvedilol  3.125 mg Oral BID WC   enoxaparin (LOVENOX) injection  40 mg Subcutaneous Q24H   furosemide  60 mg Intravenous TID AC   potassium chloride  30 mEq Oral Daily   rosuvastatin  20 mg Oral Daily   sacubitril-valsartan  1 tablet Oral BID   sodium chloride flush  3 mL Intravenous Q12H   spironolactone  25 mg Oral Daily   Continuous Infusions:  sodium chloride     PRN Meds: sodium chloride, acetaminophen **OR** acetaminophen, albuterol, HYDROcodone-acetaminophen, nitroGLYCERIN, ondansetron **OR** ondansetron (ZOFRAN) IV, senna-docusate, sodium chloride flush   Vital Signs    Vitals:   05/26/21 1623 05/26/21 2021 05/26/21 2125 05/27/21 0409  BP: 124/64 (!) 102/57 (!) 109/56 122/62  Pulse: 74 73 74 78  Resp: 17 20 16 20   Temp: 97.8 F (36.6 C) 97.9 F (36.6 C) 97.7 F (36.5 C) 98.5 F (36.9 C)  TempSrc:  Oral    SpO2: 95% 100% 100% 100%  Weight:      Height:        Intake/Output Summary (Last 24 hours) at 05/27/2021 0736 Last data filed at 05/26/2021 2340 Gross per 24 hour  Intake 483 ml  Output 4700 ml  Net -4217 ml   Filed Weights   05/24/21 0100 05/24/21 0318 05/25/21 0539  Weight: 75.1 kg 75.1 kg 71.7 kg    Physical Exam   GEN: Well nourished, well developed, in no acute distress.  HEENT: Grossly normal.  Neck: Supple, JVP looks better.  No carotid bruits, or masses. Cardiac: RRR, 3/6 syst murmur throughout.  No rubs, or gallops. No clubbing, cyanosis.  Trace posterior thigh edema.  Abd/flank edema significantly better/resolved.  Radials 2+, DP/PT 2+ and equal bilaterally.  Respiratory:  Respirations regular and unlabored, diminished breath  sounds bilaterally. GI: Soft, nontender, nondistended, BS + x 4. MS: no deformity or atrophy. Skin: warm and dry, no rash. Neuro:  Strength and sensation are intact. Psych: AAOx3.  Normal affect.  Labs    Chemistry Recent Labs  Lab 05/25/21 0743 05/26/21 0832 05/27/21 0452  NA 132* 133* 135  K 4.7 3.9 4.2  CL 85* 86* 86*  CO2 39* 38* 41*  GLUCOSE 84 88 88  BUN 21* 19 18  CREATININE 1.17* 1.12* 1.17*  CALCIUM 8.7* 8.4* 8.7*  GFRNONAA 54* 57* 54*  ANIONGAP 8 9 8      Hematology Recent Labs  Lab 05/21/21 0513 05/26/21 0832  WBC 8.5 7.0  RBC 3.74* 4.05  HGB 10.6* 11.4*  HCT 34.3* 36.2  MCV 91.7 89.4  MCH 28.3 28.1  MCHC 30.9 31.5  RDW 16.9* 16.8*  PLT 270 201    Cardiac Enzymes  Recent Labs  Lab 05/18/21 2205 05/18/21 2356  TROPONINIHS 20* 18*     Lipids  Lab Results  Component Value Date   CHOL 104 01/08/2021   HDL 40.20 01/08/2021   LDLCALC 51 01/08/2021   TRIG 66.0 01/08/2021   CHOLHDL 3 01/08/2021   Radiology    No results found.  Telemetry    RSR - Personally Reviewed  Cardiac Studies     Cardiac Catheterization  4.16.2021  Medically managed  2D Echocardiogram 3.3.2022    1. Left ventricular ejection fraction, by estimation, is 40 to 45%. Left  ventricular ejection fraction by 3D volume is 47 %. The left ventricle has  mildly decreased function. The left ventricle demonstrates global  hypokinesis. Left ventricular diastolic   parameters are consistent with Grade II diastolic dysfunction  (pseudonormalization). Elevated left ventricular end-diastolic pressure.   2. Right ventricular systolic function is severely reduced. The right  ventricular size is mildly enlarged. There is severely elevated pulmonary  artery systolic pressure. The estimated right ventricular systolic  pressure is 01.0 mmHg.   3. Left atrial size was moderately dilated.   4. Right atrial size was severely dilated.   5. The mitral valve is degenerative with  mild to moderate thickening of  the mitral valve leaflets. There is teathering of the MV leaflets due to  mild LV dysfunction. Severe mitral valve regurgitation. Mild to moderate  mitral stenosis. The mean mitral  valve gradient is 6.5 mmHg.   6. Tricuspid valve regurgitation is moderate to severe.   7. The aortic valve is tricuspid. Aortic valve regurgitation is mild.  Mild aortic valve sclerosis is present, with no evidence of aortic valve  stenosis. Aortic regurgitation PHT measures 499 msec.   8. The inferior vena cava is dilated in size with <50% respiratory  variability, suggesting right atrial pressure of 15 mmHg.   9. There is a trivial pericardial effusion posterior to the left  ventricle.  10. Compared to prior echo, Mitral regurgitation appears worse and RV  dysfunction is now present. There is severe Pulmonary HTN.  Patient Profile     60 year old female with a history of medically managed multivessel CAD, chronic combined systolic and diastolic congestive heart failure, ischemic cardiomyopathy, moderate to severe mitral regurgitation with rheumatic appearing mitral valve (without severe mitral stenosis) pulmonary hypertension, multiple sclerosis, COPD, anemia, prior tobacco abuse, and chronic dyspnea, who was admitted June 11 with volume overload/heart failure.  Assessment & Plan    Acute on chronic hypoxic respiratory failure: Multifactorial in the setting of CHF, pulmonary hypertension, COPD, and moderate to severe mitral regurgitation.  She continues to diurese well and has had significant improvement in edema.  We will continue to IV diuresis for 1 more day and can hopefully transition to oral diuretics tomorrow.  2.  Acute on chronic systolic and diastolic congestive heart failure/pulmonary arterial hypertension: EF 40-50% by echocardiogram in March 9323, grade 2 diastolic dysfunction, RVSP 69.2 mmHg, and severe mitral regurgitation.  Diffuse anasarca and CHF on admission.   She continues to respond well to intravenous Lasix and is minus 4.3L overnight.  Wt pending this morning. Renal fxn stable @ 18/1.17 w/ bicarb rising slightly to 41.  Exam significantly improved with only trace posterior lower extremity edema.  Continue IV diuresis for 1 more day and likely plan to transition to oral diuretic tomorrow.  She was previously on torsemide 20 mg as needed.  She will likely require 20 mg twice daily.  Continue beta-blocker, Entresto, and spironolactone.  We will add [place].  3.  Moderate to severe mitral regurgitation: Noted to be worsened on echo in March.  Suspect will improve with diuresis.  Will need repeat echo as outpatient.  Question candidacy for MitraClip in the future.  4.  Coronary artery disease: Status post diagnostic catheterization April 2021 showing a chronic total occlusion of the RCA and moderate diffuse disease in the left coronary tree with left-to-right collaterals.  She has been medically managed without  chest pain.  She remains on aspirin, statin, and beta-blocker.  5.  Debility/deconditioning: High fall risk.  Refusing rehab.  Encouraged that she get up to chair.  Discussed with nursing staff.   6.  Hyperlipidemia: LDL 51 in February of this year.  Continue statin therapy.  Signed, Murray Hodgkins, NP  05/27/2021, 7:36 AM    For questions or updates, please contact   Please consult [email or web address] for contact info under Cardiology/STEMI.

## 2021-05-28 ENCOUNTER — Other Ambulatory Visit: Payer: Self-pay | Admitting: Oncology

## 2021-05-28 DIAGNOSIS — I5023 Acute on chronic systolic (congestive) heart failure: Secondary | ICD-10-CM

## 2021-05-28 DIAGNOSIS — I5043 Acute on chronic combined systolic (congestive) and diastolic (congestive) heart failure: Secondary | ICD-10-CM | POA: Diagnosis not present

## 2021-05-28 LAB — BASIC METABOLIC PANEL
Anion gap: 8 (ref 5–15)
BUN: 19 mg/dL (ref 6–20)
CO2: 41 mmol/L — ABNORMAL HIGH (ref 22–32)
Calcium: 8.7 mg/dL — ABNORMAL LOW (ref 8.9–10.3)
Chloride: 84 mmol/L — ABNORMAL LOW (ref 98–111)
Creatinine, Ser: 1.24 mg/dL — ABNORMAL HIGH (ref 0.44–1.00)
GFR, Estimated: 50 mL/min — ABNORMAL LOW (ref >60)
Glucose, Bld: 99 mg/dL (ref 70–99)
Potassium: 4.2 mmol/L (ref 3.5–5.1)
Sodium: 133 mmol/L — ABNORMAL LOW (ref 135–145)

## 2021-05-28 LAB — CREATININE, SERUM
Creatinine, Ser: 1.2 mg/dL — ABNORMAL HIGH (ref 0.44–1.00)
GFR, Estimated: 52 mL/min — ABNORMAL LOW (ref >60)

## 2021-05-28 LAB — MAGNESIUM: Magnesium: 2.1 mg/dL (ref 1.7–2.4)

## 2021-05-28 MED ORDER — SACUBITRIL-VALSARTAN 24-26 MG PO TABS: 1.0000 | ORAL_TABLET | Freq: Two times a day (BID) | ORAL | 0 refills | Status: DC

## 2021-05-28 MED ORDER — DAPAGLIFLOZIN PROPANEDIOL 10 MG PO TABS: 10.0000 mg | ORAL_TABLET | Freq: Every day | ORAL | 0 refills | Status: DC

## 2021-05-28 MED ORDER — SPIRONOLACTONE 25 MG PO TABS
25.0000 mg | ORAL_TABLET | Freq: Every day | ORAL | 0 refills | Status: DC
Start: 2021-05-29 — End: 2021-06-11

## 2021-05-28 MED ORDER — TORSEMIDE 20 MG PO TABS
40.0000 mg | ORAL_TABLET | Freq: Every day | ORAL | Status: DC
  Administered 2021-05-28: 40 mg via ORAL
  Filled 2021-05-28: qty 2

## 2021-05-28 MED ORDER — TORSEMIDE 40 MG PO TABS
40.0000 mg | ORAL_TABLET | Freq: Every day | ORAL | 0 refills | Status: DC
Start: 2021-05-28 — End: 2021-06-11

## 2021-05-28 NOTE — Progress Notes (Signed)
Progress Note  Patient Name: Anita [name] Date of Encounter: 05/28/2021  Primary Cardiologist: Buford Dresser, MD  Subjective   Feels much better.  Breathing improved.  Eager to go home.  Inpatient Medications    Scheduled Meds:  aspirin EC  81 mg Oral Daily   carvedilol  3.125 mg Oral BID WC   dapagliflozin propanediol  10 mg Oral Daily   enoxaparin (LOVENOX) injection  40 mg Subcutaneous Q24H   furosemide  60 mg Intravenous TID AC   potassium chloride  30 mEq Oral Daily   rosuvastatin  20 mg Oral Daily   sacubitril-valsartan  1 tablet Oral BID   sodium chloride flush  3 mL Intravenous Q12H   spironolactone  25 mg Oral Daily   Continuous Infusions:  sodium chloride     PRN Meds: sodium chloride, acetaminophen **OR** acetaminophen, albuterol, HYDROcodone-acetaminophen, nitroGLYCERIN, ondansetron **OR** ondansetron (ZOFRAN) IV, senna-docusate, sodium chloride flush   Vital Signs    Vitals:   05/27/21 1145 05/27/21 1725 05/27/21 1932 05/28/21 0450  BP: 118/68 124/70 101/66 (!) 95/54  Pulse:  82 74 71  Resp: 20 20 20 18   Temp: 98.3 F (36.8 C) 98.3 F (36.8 C) 98.2 F (36.8 C) 97.8 F (36.6 C)  TempSrc: Oral Oral Oral Oral  SpO2: 100% 100% 100% 100%  Weight:      Height:        Intake/Output Summary (Last 24 hours) at 05/28/2021 0740 Last data filed at 05/28/2021 4235 Gross per 24 hour  Intake 1020 ml  Output 6000 ml  Net -4980 ml   Filed Weights   05/24/21 0100 05/24/21 0318 05/25/21 0539  Weight: 75.1 kg 75.1 kg 71.7 kg    Physical Exam   GEN: Well nourished, well developed, in no acute distress.  HEENT: Grossly normal.  Neck: Supple, no JVD, carotid bruits, or masses. Cardiac: RRR, 3/6 syst murmur throughout, no rubs, or gallops. No clubbing, cyanosis, edema.  Radials 2+, DP/PT 2+ and equal bilaterally.  Respiratory:  Respirations regular and unlabored, clear to auscultation bilaterally. GI: Soft, nontender, nondistended, BS + x  4. MS: no deformity or atrophy. Skin: warm and dry, no rash. Neuro:  Strength and sensation are intact. Psych: AAOx3.  Normal affect.  Labs    Chemistry Recent Labs  Lab 05/25/21 0743 05/26/21 0832 05/27/21 0452 05/28/21 0459  NA 132* 133* 135  --   K 4.7 3.9 4.2  --   CL 85* 86* 86*  --   CO2 39* 38* 41*  --   GLUCOSE 84 88 88  --   BUN 21* 19 18  --   CREATININE 1.17* 1.12* 1.17* 1.20*  CALCIUM 8.7* 8.4* 8.7*  --   GFRNONAA 54* 57* 54* 52*  ANIONGAP 8 9 8   --      Hematology Recent Labs  Lab 05/26/21 0832  WBC 7.0  RBC 4.05  HGB 11.4*  HCT 36.2  MCV 89.4  MCH 28.1  MCHC 31.5  RDW 16.8*  PLT 201    Cardiac Enzymes  Recent Labs  Lab 05/18/21 2205 05/18/21 2356  TROPONINIHS 20* 18*     Lipids  Lab Results  Component Value Date   CHOL 104 01/08/2021   HDL 40.20 01/08/2021   LDLCALC 51 01/08/2021   TRIG 66.0 01/08/2021   CHOLHDL 3 01/08/2021   Radiology    No results found.  Telemetry    RSR - Personally Reviewed  Cardiac Studies    Cardiac Catheterization  4.16.2021  Medically managed      2D Echocardiogram 3.3.2022    1. Left ventricular ejection fraction, by estimation, is 40 to 45%. Left  ventricular ejection fraction by 3D volume is 47 %. The left ventricle has  mildly decreased function. The left ventricle demonstrates global  hypokinesis. Left ventricular diastolic   parameters are consistent with Grade II diastolic dysfunction  (pseudonormalization). Elevated left ventricular end-diastolic pressure.   2. Right ventricular systolic function is severely reduced. The right  ventricular size is mildly enlarged. There is severely elevated pulmonary  artery systolic pressure. The estimated right ventricular systolic  pressure is 57.2 mmHg.   3. Left atrial size was moderately dilated.   4. Right atrial size was severely dilated.   5. The mitral valve is degenerative with mild to moderate thickening of  the mitral valve leaflets.  There is teathering of the MV leaflets due to  mild LV dysfunction. Severe mitral valve regurgitation. Mild to moderate  mitral stenosis. The mean mitral  valve gradient is 6.5 mmHg.   6. Tricuspid valve regurgitation is moderate to severe.   7. The aortic valve is tricuspid. Aortic valve regurgitation is mild.  Mild aortic valve sclerosis is present, with no evidence of aortic valve  stenosis. Aortic regurgitation PHT measures 499 msec.   8. The inferior vena cava is dilated in size with <50% respiratory  variability, suggesting right atrial pressure of 15 mmHg.   9. There is a trivial pericardial effusion posterior to the left  ventricle.  10. Compared to prior echo, Mitral regurgitation appears worse and RV  dysfunction is now present. There is severe Pulmonary HTN.  Patient Profile     60 year old female with a history of medically managed multivessel CAD, chronic combined systolic and diastolic congestive heart failure, ischemic cardiomyopathy, moderate to severe mitral regurgitation with rheumatic appearing mitral valve (without severe mitral stenosis) pulmonary hypertension, multiple sclerosis, COPD, anemia, prior tobacco abuse, and chronic dyspnea, who was admitted June 11 with volume overload/heart failure.  Assessment & Plan    1.  Acute on chronic hypoxic respiratory failure: Multifactorial in the setting of CHF, pulmonary hypertension, COPD, and moderate to severe mitral regurgitation.  Euvolemic on exam this AM and feeling much improved.  Creat bumping slightly and will transition to PO lasix.  2.  Acute on chronic systolic and diastolic congestive heart failure/pulmonary arterial hypertension: EF 40-50% by echocardiogram in March 6203, grade 2 diastolic dysfunction, RVSP 69.2 mmHg, and severe mitral regurgitation.  Diffuse anasarca and CHF on admission.  Mins 4.9L yesterday.  Wt down to 66 kg.  Feeling much better and euvolemic on exam.  Creat bumping slightly.  Will transition  to torsemide 40mg  PO daily.  Rec discharging on this dose w/ early outpt f/u.  Cont ? blocker, entresto, spironolactone, and farxiga.  3.  Mod to severe MR:  worsened on March 2022 echo.  Plan outpt f/u in setting of euvolemia.  ? Candidacy for mitraclip in the future.  4.  CAD:  s/p cath in 03/2020 showing a chronic total occlusion of the RCA and moderate diffuse disease in the left coronary tree with left-to-right collaterals.  She has been medically managed without chest pain.  Cont aspirin, statin, and beta-blocker.  5.  HL:  LDL 51 in 01/2021.  Cont statin.  6.  Debility/deconditioning:  high fall risk. Has refused rehab.  Plan for d/c to home w/ HH.  Signed, Murray Hodgkins, NP  05/28/2021, 7:40 AM    For  questions or updates, please contact   Please consult [email or web address] for contact info under Cardiology/STEMI.

## 2021-05-28 NOTE — TOC Transition Note (Signed)
Transition of Care [facility]) - CM/SW Discharge Note   Patient Details  Name: ANJULIE DIPIERRO MRN: [id number] Date of Birth: 04-24-1961  [facility] CM/SW Contact:  Kerin Salen, RN Phone Number: 05/28/2021, 12:29 PM   Clinical Narrative: Patient to discharge today with [facility] home [facility], Malachy Mood notified of discharge. TOC barriers resolved.      Final next level of care: [place] Barriers to Discharge: Barriers Resolved   Patient Goals and CMS Choice Patient states their goals for this hospitalization and ongoing recovery are:: To return home with [facility].   Choice offered to / list presented to : Adult Children  Discharge Placement                Patient to be transferred to facility by: Daughter will transport home Name of family member notified: [name] per RN Patient and family notified of of transfer: 05/28/21  Discharge Plan and Services In-house Referral: Clinical Social Work   Post Acute Care Choice: Home Health          DME Arranged: N/A DME Agency: NA       HH Arranged: PT, OT HH Agency: [place] Date [place]: 05/28/21 Time HH Agency Contacted: 1100 Representative spoke with at [place]: [place] ([place]) Interventions     Readmission Risk Interventions No flowsheet data found.

## 2021-05-28 NOTE — Discharge Summary (Signed)
Physician Discharge Summary  Anita [name] IRC:[id number] DOB: 1961/11/29 DOA: 05/18/2021  PCP: [name], NP  Admit date: 05/18/2021 Discharge date: 05/28/2021  Admitted From: Home Disposition: Home with home health  Recommendations for Outpatient Follow-up:  Follow up with PCP in 1-2 weeks Follow-up with cardiology and heart failure team as directed  Home Health: Yes Equipment/Devices: No  Discharge Condition: Stable CODE STATUS: Full Diet recommendation: Heart Healthy / Carb Modified  Brief/Interim Summary: 60 year old female with history of multiple sclerosis, COPD, CAD, chronic combined CHF, last EF 40 to 45% 02/2021, ischemic cardiomyopathy, severe mitral regurgitation with limited surgical options, severe pulmonary hypertension, previously on home O2, HTN, ex smoker, presenting with anasarca.     Patient has been diuresing effectively on Lasix 60 mg IV twice daily.  She remains significantly anasarcic.  Diuresing well.  6 L net negative over admission.   Patient has quite a bit of a dependent edema that likely is related to her immobility.  Therapy recommends a skilled nursing facility however after my conversations with the daughter she is refusing SNF placement due to previous bad experiences.  The patient will need to mobilize as much as possible otherwise will be very difficult to get her back to dry weight even with aggressive diuresis   As of 6/20 patient is approaching dry weight.  She is 20 L net negative since admission.  Case discussed with cardiology.  Plan for 1 additional day of IV diuresis with plans to transition to oral diuretics on 6/21  On 6/21 Case was discussed with cardiology consultant.  Patient is 25 L net negative and has approached her dry weight.  Stable for discharge at this time.  Transition to p.o. torsemide 40 mg daily.  Home ACE inhibitor transition to [place].  Farxiga and Aldactone also started.    Had a lengthy discussion with the  patient as well as her daughter over the phone and explained that symptom management is very important to avoid future hospitalizations.  Patient needs to get up and ambulate as much as possible at home to avoid dependent edema.  Needs to take her cardiac medications regularly.  Patient's daughter expressed understanding and stated she already has an appointment with the heart failure clinic as well as her established cardiologist Dr. Harrell Gave.  Discharge Diagnoses:  Principal Problem:   Acute on chronic combined systolic and diastolic CHF (congestive heart failure) (HCC) Active Problems:   Multiple sclerosis (HCC)   Essential hypertension   Pulmonary hypertension (HCC)   COPD (chronic obstructive pulmonary disease) ([place])   Ischemic cardiomyopathy   Coronary artery disease involving native coronary artery of native heart without angina pectoris   Mitral valve insufficiency   UTI (urinary tract infection)   CHF (congestive heart failure) (HCC)   Hypoxia   Anasarca  Anasarca   Acute hypoxic respiratory failure due to CHF exacerbation   Acute on chronic combined systolic and diastolic CHF   Ischemic cardiomyopathy   Severe mitral regurgitation - Last echo on file from March 2022 with grade 2 diastolic dysfunction EF 40 to 45% with severe mitral regurg - On admission BNP 4147 with troponin trending down, EKG nonacute and no chest pain - Suspect related to severe mitral regurg as well as severe pulmonary hypertension -- Diuresed effectively on high-dose loop diuretics.  25 L net negative for admission.  Transition to p.o. torsemide at time of discharge.  40 mg a day.  Also added [place] at home medication regimen.  Multiple sclerosis ([place]) - Patient is immobile at baseline and does not seem motivated to move.  Stressed that she needs to exert herself to avoid development of dependent edema post discharge.     Essential hypertension - Continue Coreg and Entresto --  Started Aldactone during this admission   Hypoxia Pulmonary hypertension (HCC) COPD (chronic obstructive pulmonary disease) (HCC) - O2 sat 89% on room air requiring 2 L O2 - Weaned off submental oxygen at time of discharge   CAD - Troponin trending down with nonacute EKG and no chest pain - Continue aspirin, rosuvastatin and Coreg   Possible UTI (urinary tract infection) - Urinalysis with large LE and many bacteria but no urinary symptoms or fever.  No leukocytosis - It appears no culture was sent on the original UA -Patient not having any urinary symptoms -No indication for antibiotics at this time  Discharge Instructions  Discharge Instructions     Diet - low sodium heart healthy   Complete by: As directed    Increase activity slowly   Complete by: As directed       Allergies as of 05/28/2021       Reactions   Acthar Hp [corticotropin] Other (See Comments)   Throat swelling and coughing up blood   Codeine    Prednisone         Medication List     STOP taking these medications    losartan 25 MG tablet Commonly known as: COZAAR   potassium chloride SA 20 MEQ tablet Commonly known as: KLOR-CON       TAKE these medications    acetaminophen 325 MG tablet Commonly known as: TYLENOL Take 2 tablets (650 mg total) by mouth every 6 (six) hours as needed for mild pain (or Fever >/= 101).   albuterol 108 (90 Base) MCG/ACT inhaler Commonly known as: VENTOLIN HFA Inhale 1-2 puffs into the lungs every 6 (six) hours as needed for wheezing or shortness of breath. Use sparingly!   aspirin 81 MG EC tablet Take 1 tablet (81 mg total) by mouth daily.   calcium carbonate 750 MG chewable tablet Commonly known as: TUMS EX Chew 1 tablet by mouth daily as needed for heartburn.   carvedilol 3.125 MG tablet Commonly known as: COREG TAKE 1 TABLET (3.125 MG TOTAL) BY MOUTH 2 (TWO) TIMES DAILY WITH A MEAL.   cetirizine 10 MG tablet Commonly known as: ZYRTEC Take 10 mg  by mouth daily.   dapagliflozin propanediol 10 MG Tabs tablet Commonly known as: FARXIGA Take 1 tablet (10 mg total) by mouth daily. Start taking on: May 29, 2021   feeding supplement Liqd Take 237 mLs by mouth 2 (two) times daily between meals.   Fluticasone-Salmeterol 250-50 MCG/DOSE Aepb Commonly known as: Advair Diskus Inhale 1 puff into the lungs 2 (two) times daily.   guaiFENesin 600 MG 12 hr tablet Commonly known as: MUCINEX Take 600 mg by mouth 2 (two) times daily as needed for cough.   mirtazapine 15 MG tablet Commonly known as: Remeron Take 1 tablet (15 mg total) by mouth at bedtime.   multivitamin with minerals Tabs tablet Take 1 tablet by mouth daily.   nitroGLYCERIN 0.4 MG SL tablet Commonly known as: NITROSTAT Place 0.4 mg under the tongue every 5 (five) minutes as needed for chest pain.   omeprazole 40 MG capsule Commonly known as: PRILOSEC Take 1 capsule (40 mg total) by mouth daily.   Probiotic Advanced Caps Take 1 capsule by mouth daily.  rosuvastatin 20 MG tablet Commonly known as: CRESTOR Take 1 tablet (20 mg total) by mouth daily. For cholesterol.   sacubitril-valsartan 24-26 MG Commonly known as: ENTRESTO Take 1 tablet by mouth 2 (two) times daily.   SLEEP AID PO Take 1 tablet by mouth at bedtime as needed (sleep).   spironolactone 25 MG tablet Commonly known as: ALDACTONE Take 1 tablet (25 mg total) by mouth daily. Start taking on: May 29, 2021   SUMAtriptan 25 MG tablet Commonly known as: IMITREX Take 1 tablet by mouth at migraine onset, may repeat in 2 hours if headache persists or recurs. Do not exceed 2 tablets in 24 hours.   Torsemide 40 MG Tabs Take 40 mg by mouth daily. For leg or abdomen swelling What changed:  medication strength how much to take when to take this reasons to take this   vitamin B-12 1000 MCG tablet Commonly known as: CYANOCOBALAMIN Take 1,000 mcg by mouth daily.       ASK your doctor about these  medications    ferrous sulfate 325 (65 FE) MG EC tablet TAKE 1 TABLET BY MOUTH 2 TIMES DAILY WITH A MEAL.        Follow-up Information     [name], NP. Schedule an appointment as soon as possible for a visit on 06/04/2021.   Specialty: Internal Medicine Why: Repeat labs in 1 week.Marland Kitchen@ 12:20pm Contact information: [place] [address] [phone]         Buford Dresser, MD Follow up on 05/31/2021.   Specialty: Cardiology Why: Appointment at [facility] information: [address] [place] [address] [phone]                Allergies  Allergen Reactions   Acthar Hp [Corticotropin] Other (See Comments)    Throat swelling and coughing up blood   Codeine    Prednisone     Consultations: Cardiology-[facility]   Procedures/Studies: US Venous Img Upper Uni Left  Result Date: 05/18/2021 CLINICAL DATA:  Edema EXAM: LEFT UPPER EXTREMITY VENOUS DOPPLER ULTRASOUND TECHNIQUE: Gray-scale sonography with graded compression, as well as color Doppler and duplex ultrasound were performed to evaluate the upper extremity deep venous system from the level of the subclavian vein and including the jugular, axillary, basilic, radial, ulnar and upper cephalic vein. Spectral Doppler was utilized to evaluate flow at rest and with distal augmentation maneuvers. COMPARISON:  None. FINDINGS: Contralateral Subclavian Vein: Respiratory phasicity is normal and symmetric with the symptomatic side. No evidence of thrombus. Normal compressibility. Internal Jugular Vein: No evidence of thrombus. Normal compressibility, respiratory phasicity and response to augmentation. Subclavian Vein: No evidence of thrombus. Normal compressibility, respiratory phasicity and response to augmentation. Axillary Vein: No evidence of thrombus. Normal compressibility, respiratory phasicity and response to augmentation. Cephalic Vein: No evidence of thrombus. Normal  compressibility, respiratory phasicity and response to augmentation. Basilic Vein: No evidence of thrombus. Normal compressibility, respiratory phasicity and response to augmentation. Brachial Veins: No evidence of thrombus. Normal compressibility, respiratory phasicity and response to augmentation. Radial Veins: No evidence of thrombus. Normal compressibility, respiratory phasicity and response to augmentation. Ulnar Veins: No evidence of thrombus. Normal compressibility, respiratory phasicity and response to augmentation. Venous Reflux:  None visualized. Other Findings:  None visualized. IMPRESSION: No evidence of DVT within the left upper extremity. Diffuse subcutaneous edema. Electronically Signed   By: Dorise Bullion III M.D   On: 05/18/2021 23:58   DG Chest Port 1 View  Result  Date: 05/18/2021 CLINICAL DATA:  Shortness of breath EXAM: PORTABLE CHEST 1 VIEW COMPARISON:  01/08/2021 FINDINGS: Cardiomegaly. Bilateral airspace disease, right greater than left. Probable layering effusions. No acute bony abnormality. IMPRESSION: Bilateral airspace disease with cardiomegaly and probable layering effusions. Findings could reflect edema or infection. Electronically Signed   By: Rolm Baptise M.D.   On: 05/18/2021 22:03   (Echo, Carotid, EGD, Colonoscopy, ERCP)    Subjective: Seen and examined on the day of discharge.  Stable no distress.  Feels better overall.  Stable for discharge home  Discharge Exam: Vitals:   05/28/21 1113 05/28/21 1427  BP: 98/60 118/68  Pulse: 73 74  Resp: 18 18  Temp: 98.6 F (37 C) 98.8 F (37.1 C)  SpO2: 95% 97%   Vitals:   05/28/21 0806 05/28/21 0900 05/28/21 1113 05/28/21 1427  BP: 102/60  98/60 118/68  Pulse: 73  73 74  Resp: 17  18 18   Temp: 98.8 F (37.1 C)  98.6 F (37 C) 98.8 F (37.1 C)  TempSrc: Oral  Oral Oral  SpO2: 95%  95% 97%  Weight:  66 kg    Height:        General: Pt is alert, awake, not in acute distress Cardiovascular: RRR, S1/S2 +, no  rubs, no gallops Respiratory: CTA bilaterally, no wheezing, no rhonchi Abdominal: Soft, NT, ND, bowel sounds + Extremities: no edema, no cyanosis    The results of significant diagnostics from this hospitalization (including imaging, microbiology, ancillary and laboratory) are listed below for reference.     Microbiology: Recent Results (from the past 240 hour(s))  Resp Panel by RT-PCR (Flu A&B, Covid) Nasopharyngeal Swab     Status: None   Collection Time: 05/18/21 10:05 PM   Specimen: Nasopharyngeal Swab; Nasopharyngeal(NP) swabs in vial transport medium  Result Value Ref Range Status   SARS Coronavirus 2 by RT PCR NEGATIVE NEGATIVE Final    Comment: (NOTE) SARS-CoV-2 target nucleic acids are NOT DETECTED.  The SARS-CoV-2 RNA is generally detectable in upper respiratory specimens during the acute phase of infection. The lowest concentration of SARS-CoV-2 viral copies this assay can detect is 138 copies/mL. A negative result does not preclude SARS-Cov-2 infection and should not be used as the sole basis for treatment or other patient management decisions. A negative result may occur with  improper specimen collection/handling, submission of specimen other than nasopharyngeal swab, presence of viral mutation(s) within the areas targeted by this assay, and inadequate number of viral copies(<138 copies/mL). A negative result must be combined with clinical observations, patient history, and epidemiological information. The expected result is Negative.  Fact Sheet for Patients:  EntrepreneurPulse.com.au  Fact Sheet for Healthcare Providers:  IncredibleEmployment.be  This test is no t yet approved or cleared by the [place] FDA and  has been authorized for detection and/or diagnosis of SARS-CoV-2 by FDA under an Emergency Use Authorization (EUA). This EUA will remain  in effect (meaning this test can be used) for the duration of  the COVID-19 declaration under Section 564(b)(1) of the Act, 21 U.S.C.section 360bbb-3(b)(1), unless the authorization is terminated  or revoked sooner.       Influenza A by PCR NEGATIVE NEGATIVE Final   Influenza B by PCR NEGATIVE NEGATIVE Final    Comment: (NOTE) The Xpert Xpress SARS-CoV-2/FLU/RSV plus assay is intended as an aid in the diagnosis of influenza from Nasopharyngeal swab specimens and should not be used as a sole basis for treatment. Nasal washings and aspirates are unacceptable for  Xpert Xpress SARS-CoV-2/FLU/RSV testing.  Fact Sheet for Patients: EntrepreneurPulse.com.au  Fact Sheet for Healthcare Providers: IncredibleEmployment.be  This test is not yet approved or cleared by the [place] FDA and has been authorized for detection and/or diagnosis of SARS-CoV-2 by FDA under an Emergency Use Authorization (EUA). This EUA will remain in effect (meaning this test can be used) for the duration of the COVID-19 declaration under Section 564(b)(1) of the Act, 21 U.S.C. section 360bbb-3(b)(1), unless the authorization is terminated or revoked.  Performed at [facility], [address] [place], [place] [address]      Labs: BNP (last 3 results) Recent Labs    05/18/21 2205  BNP 6,045.4*   Basic Metabolic Panel: Recent Labs  Lab 05/24/21 0435 05/25/21 0743 05/26/21 0832 05/27/21 0452 05/28/21 0459 05/28/21 0814  NA 130* 132* 133* 135  --  133*  K 4.6 4.7 3.9 4.2  --  4.2  CL 88* 85* 86* 86*  --  84*  CO2 36* 39* 38* 41*  --  41*  GLUCOSE 94 84 88 88  --  99  BUN 24* 21* 19 18  --  19  CREATININE 1.18* 1.17* 1.12* 1.17* 1.20* 1.24*  CALCIUM 8.5* 8.7* 8.4* 8.7*  --  8.7*  MG  --   --  1.9  --   --  2.1   Liver Function Tests: No results for input(s): AST, ALT, ALKPHOS, BILITOT, PROT, ALBUMIN in the last 168 hours. No results for input(s): LIPASE, AMYLASE in the last 168 hours. No results for  input(s): AMMONIA in the last 168 hours. CBC: Recent Labs  Lab 05/26/21 0832  WBC 7.0  NEUTROABS 5.0  HGB 11.4*  HCT 36.2  MCV 89.4  PLT 201   Cardiac Enzymes: No results for input(s): CKTOTAL, CKMB, CKMBINDEX, TROPONINI in the last 168 hours. BNP: Invalid input(s): POCBNP CBG: No results for input(s): GLUCAP in the last 168 hours. D-Dimer No results for input(s): DDIMER in the last 72 hours. Hgb A1c No results for input(s): HGBA1C in the last 72 hours. Lipid Profile No results for input(s): CHOL, HDL, LDLCALC, TRIG, CHOLHDL, LDLDIRECT in the last 72 hours. Thyroid function studies No results for input(s): TSH, T4TOTAL, T3FREE, THYROIDAB in the last 72 hours.  Invalid input(s): FREET3 Anemia work up No results for input(s): VITAMINB12, FOLATE, FERRITIN, TIBC, IRON, RETICCTPCT in the last 72 hours. Urinalysis    Component Value Date/Time   COLORURINE YELLOW (A) 05/18/2021 2205   APPEARANCEUR HAZY (A) 05/18/2021 2205   APPEARANCEUR Clear 07/02/2020 1620   LABSPEC 1.016 05/18/2021 2205   PHURINE 5.0 05/18/2021 2205   GLUCOSEU NEGATIVE 05/18/2021 2205   HGBUR MODERATE (A) 05/18/2021 2205   BILIRUBINUR NEGATIVE 05/18/2021 2205   BILIRUBINUR Negative 07/02/2020 1620   KETONESUR NEGATIVE 05/18/2021 2205   PROTEINUR 100 (A) 05/18/2021 2205   NITRITE NEGATIVE 05/18/2021 2205   LEUKOCYTESUR LARGE (A) 05/18/2021 2205   Sepsis Labs Invalid input(s): PROCALCITONIN,  WBC,  LACTICIDVEN Microbiology Recent Results (from the past 240 hour(s))  Resp Panel by RT-PCR (Flu A&B, Covid) Nasopharyngeal Swab     Status: None   Collection Time: 05/18/21 10:05 PM   Specimen: Nasopharyngeal Swab; Nasopharyngeal(NP) swabs in vial transport medium  Result Value Ref Range Status   SARS Coronavirus 2 by RT PCR NEGATIVE NEGATIVE Final    Comment: (NOTE) SARS-CoV-2 target nucleic acids are NOT DETECTED.  The SARS-CoV-2 RNA is generally detectable in upper respiratory specimens during the  acute phase of infection.  The lowest concentration of SARS-CoV-2 viral copies this assay can detect is 138 copies/mL. A negative result does not preclude SARS-Cov-2 infection and should not be used as the sole basis for treatment or other patient management decisions. A negative result may occur with  improper specimen collection/handling, submission of specimen other than nasopharyngeal swab, presence of viral mutation(s) within the areas targeted by this assay, and inadequate number of viral copies(<138 copies/mL). A negative result must be combined with clinical observations, patient history, and epidemiological information. The expected result is Negative.  Fact Sheet for Patients:  EntrepreneurPulse.com.au  Fact Sheet for Healthcare Providers:  IncredibleEmployment.be  This test is no t yet approved or cleared by the [place] FDA and  has been authorized for detection and/or diagnosis of SARS-CoV-2 by FDA under an Emergency Use Authorization (EUA). This EUA will remain  in effect (meaning this test can be used) for the duration of the COVID-19 declaration under Section 564(b)(1) of the Act, 21 U.S.C.section 360bbb-3(b)(1), unless the authorization is terminated  or revoked sooner.       Influenza A by PCR NEGATIVE NEGATIVE Final   Influenza B by PCR NEGATIVE NEGATIVE Final    Comment: (NOTE) The Xpert Xpress SARS-CoV-2/FLU/RSV plus assay is intended as an aid in the diagnosis of influenza from Nasopharyngeal swab specimens and should not be used as a sole basis for treatment. Nasal washings and aspirates are unacceptable for Xpert Xpress SARS-CoV-2/FLU/RSV testing.  Fact Sheet for Patients: EntrepreneurPulse.com.au  Fact Sheet for Healthcare Providers: IncredibleEmployment.be  This test is not yet approved or cleared by the [place] FDA and has been authorized for detection and/or  diagnosis of SARS-CoV-2 by FDA under an Emergency Use Authorization (EUA). This EUA will remain in effect (meaning this test can be used) for the duration of the COVID-19 declaration under Section 564(b)(1) of the Act, 21 U.S.C. section 360bbb-3(b)(1), unless the authorization is terminated or revoked.  Performed at [facility], [address], [place], [place] [address]      Time coordinating discharge: Over 30 minutes  SIGNED:   Sidney Ace, MD  [facility] 05/28/2021, 2:39 PM Pager   If 7PM-7AM, please contact night-coverage

## 2021-05-28 NOTE — Progress Notes (Signed)
Pt is A&O, VS stable, NSR on the monitor. IV in place. Got norco once for pain and tylenol once for 'stiffness' Urine O/P good. [place] in place. 2L [place].

## 2021-05-28 NOTE — Progress Notes (Signed)
IV and tele removed from patient. Discharge instructions given to patient and family member at bedside. Verbalized understanding. No acute distress at this time. Patient's family member to transport her home.

## 2021-05-28 NOTE — Progress Notes (Cosign Needed)
20g IV right forearm, dressing changed. [place], PNSN, RCC

## 2021-05-28 NOTE — Plan of Care (Signed)

## 2021-05-29 ENCOUNTER — Telehealth: Payer: Self-pay

## 2021-05-29 NOTE — Telephone Encounter (Signed)
Transition Care Management Follow-up Telephone Call Date of discharge and from where: 05/28/2021, [facility] How have you been since you were released from the hospital? Spoke with Jenny Reichmann, daughter and HIPAA verified. Patient is doing fine, resting comfortably in bed. Any questions or concerns? No  Items Reviewed: Did the pt receive and understand the discharge instructions provided? Yes  Medications obtained and verified? Yes  Other? No  Any new allergies since your discharge? No  Dietary orders reviewed? Yes Do you have support at home? Yes   Home Care and Equipment/Supplies: Were home health services ordered? yes If so, what is the name of the agency? unknown  Has the agency set up a time to come to the patient's home? no Were any new equipment or medical supplies ordered?  No What is the name of the medical supply agency? N/A Were you able to get the supplies/equipment? not applicable Do you have any questions related to the use of the equipment or supplies? No  Functional Questionnaire: (I = Independent and D = Dependent) ADLs: I  Bathing/Dressing- I  Meal Prep- I  Eating- I  Maintaining continence- I  Transferring/Ambulation- I  Managing Meds- I  Follow up appointments reviewed:  PCP Hospital f/u appt confirmed? Yes  Scheduled to see Alma Friendly, NP on 06/07/2021 @ 9:40 am. [facility] f/u appt confirmed? Yes  Scheduled to see Dr. Harrell Gave on 05/31/2021 @ 9 am. Are transportation arrangements needed? No  If their condition worsens, is the pt aware to call PCP or go to the Emergency Dept.? Yes Was the patient provided with contact information for the PCP's office or ED? Yes Was to pt encouraged to call back with questions or concerns? Yes

## 2021-05-31 ENCOUNTER — Encounter: Payer: Self-pay | Admitting: Cardiology

## 2021-05-31 ENCOUNTER — Ambulatory Visit (INDEPENDENT_AMBULATORY_CARE_PROVIDER_SITE_OTHER): Payer: Medicare Other | Admitting: Cardiology

## 2021-05-31 ENCOUNTER — Other Ambulatory Visit: Payer: Self-pay

## 2021-05-31 ENCOUNTER — Telehealth: Payer: Self-pay

## 2021-05-31 VITALS — BP 104/60 | HR 78 | Resp 18 | Ht 63.0 in | Wt 128.0 lb

## 2021-05-31 DIAGNOSIS — I110 Hypertensive heart disease with heart failure: Secondary | ICD-10-CM | POA: Diagnosis not present

## 2021-05-31 DIAGNOSIS — I255 Ischemic cardiomyopathy: Secondary | ICD-10-CM | POA: Diagnosis not present

## 2021-05-31 DIAGNOSIS — I340 Nonrheumatic mitral (valve) insufficiency: Secondary | ICD-10-CM

## 2021-05-31 DIAGNOSIS — J9601 Acute respiratory failure with hypoxia: Secondary | ICD-10-CM | POA: Diagnosis not present

## 2021-05-31 DIAGNOSIS — N390 Urinary tract infection, site not specified: Secondary | ICD-10-CM | POA: Diagnosis not present

## 2021-05-31 DIAGNOSIS — J449 Chronic obstructive pulmonary disease, unspecified: Secondary | ICD-10-CM | POA: Diagnosis not present

## 2021-05-31 DIAGNOSIS — Z09 Encounter for follow-up examination after completed treatment for conditions other than malignant neoplasm: Secondary | ICD-10-CM | POA: Diagnosis not present

## 2021-05-31 DIAGNOSIS — I2510 Atherosclerotic heart disease of native coronary artery without angina pectoris: Secondary | ICD-10-CM | POA: Diagnosis not present

## 2021-05-31 DIAGNOSIS — I5043 Acute on chronic combined systolic (congestive) and diastolic (congestive) heart failure: Secondary | ICD-10-CM | POA: Diagnosis not present

## 2021-05-31 DIAGNOSIS — Z87891 Personal history of nicotine dependence: Secondary | ICD-10-CM | POA: Diagnosis not present

## 2021-05-31 DIAGNOSIS — I272 Pulmonary hypertension, unspecified: Secondary | ICD-10-CM | POA: Diagnosis not present

## 2021-05-31 DIAGNOSIS — Z7982 Long term (current) use of aspirin: Secondary | ICD-10-CM | POA: Diagnosis not present

## 2021-05-31 DIAGNOSIS — I5042 Chronic combined systolic (congestive) and diastolic (congestive) heart failure: Secondary | ICD-10-CM

## 2021-05-31 DIAGNOSIS — G35 Multiple sclerosis: Secondary | ICD-10-CM | POA: Diagnosis not present

## 2021-05-31 NOTE — Telephone Encounter (Signed)
Anita [name] [facility] PT with [facility]  left v/m requesting verbal orders for [name] PT 2 x a wk for 4 wks and 1 x a wk for 4 wks and also request home health aide 2 x a wk for 4 wks for bathing assistance.

## 2021-05-31 NOTE — Patient Instructions (Signed)
Medication Instructions:  Your Physician recommend you continue on your current medication as directed.    *If you need a refill on your cardiac medications before your next appointment, please call your pharmacy*   Lab Work: None ordered today   Testing/Procedures: None ordered today   Follow-Up: At [facility], you and your health needs are our priority.  As part of our continuing mission to provide you with exceptional heart care, we have created designated Provider Care Teams.  These Care Teams include your primary Cardiologist (physician) and Advanced Practice Providers (APPs -  Physician Assistants and Nurse Practitioners) who all work together to provide you with the care you need, when you need it.  We recommend signing up for the patient portal called "MyChart".  Sign up information is provided on this After Visit Summary.  MyChart is used to connect with patients for Virtual Visits (Telemedicine).  Patients are able to view lab/test results, encounter notes, upcoming appointments, etc.  Non-urgent messages can be sent to your provider as well.   To learn more about what you can do with MyChart, go to [email or web address].    Your next appointment:   3 month(s) @ [address] [place], [place] [address]   The format for your next appointment:   In Person  Provider:   Bridgette Christopher, MD     

## 2021-05-31 NOTE — Progress Notes (Signed)
Cardiology Office Note:    Date:  05/31/2021   ID:  Anita [name], DOB Nov 05, 1961, MRN [id number]  PCP:  [name], NP  Cardiologist:  Buford Dresser, MD  Referring MD: [name], NP   CC: follow up  History of Present Illness:    Anita [name] is a 60 y.o. female with history of multiple sclerosis, CAD, severe mitral regurgitation with moderate mitral stenosis, likely COPD, prior tobacco use. I met her in the hospital 03/2020, see workup below.  Today: She was just discharged from [facility] on 05/28/21. She was admitted for acute on chronic systolic and diastolic heart failure, with her known severe MR and moderate MS.   Was very volume overloaded on presentation, weighs intermittently at home, didn't realize she had gained that much.  We again discussed TEE and surgical consultation, though I doubt she would be a candidate. After shared decision making, she does not want to pursue additional procedures or surgical referral.  We had an extensive conversation about goals of care. She also endorsed that she would want to be DNR. I discussed both palliative care and hospice today. She will discuss with her family and let me know.  She is very physically limited. Her balance has limited her movement. She has extreme difficulty just toileting.    Past Medical History:  Diagnosis Date   Abnormality of gait 07/26/2013   Acute respiratory distress 03/20/2020   Acute respiratory failure with hypoxia (HCC) 03/20/2020   Allergy    Arthritis    Atypical pneumonia 03/22/2020   Chickenpox    Elevated troponin 03/22/2020   GERD (gastroesophageal reflux disease)    Headache(784.0)    Migraine   Hearing loss    Right ear secondary to infection   History of shingles    Migraines    Multiple sclerosis (HCC)    Obese    Optic neuritis    Prolonged QT interval 03/20/2020    Past Surgical History:  Procedure Laterality Date   COLONOSCOPY WITH PROPOFOL N/A  08/03/2020   Procedure: COLONOSCOPY WITH PROPOFOL;  Surgeon: [name], MD;  Location: [facility] ENDOSCOPY;  Service: Gastroenterology;  Laterality: N/A;   Ganglioneuroma     Resection   LEFT HEART CATH AND CORONARY ANGIOGRAPHY N/A 03/23/2020   Procedure: LEFT HEART CATH AND CORONARY ANGIOGRAPHY;  Surgeon: Leonie Man, MD;  Location: [facility];  Service: Cardiovascular;  Laterality: N/A;   PILONIDAL CYST EXCISION     PILONIDAL CYST EXCISION  1983   TUMOR REMOVAL  2007/2008    Current Medications: Current Outpatient Medications on File Prior to Visit  Medication Sig   acetaminophen (TYLENOL) 325 MG tablet Take 2 tablets (650 mg total) by mouth every 6 (six) hours as needed for mild pain (or Fever >/= 101).   albuterol (VENTOLIN HFA) 108 (90 Base) MCG/ACT inhaler Inhale 1-2 puffs into the lungs every 6 (six) hours as needed for wheezing or shortness of breath. Use sparingly!   aspirin EC 81 MG EC tablet Take 1 tablet (81 mg total) by mouth daily.   calcium carbonate (TUMS EX) 750 MG chewable tablet Chew 1 tablet by mouth daily as needed for heartburn.   carvedilol (COREG) 3.125 MG tablet TAKE 1 TABLET (3.125 MG TOTAL) BY MOUTH 2 (TWO) TIMES DAILY WITH A MEAL.   cetirizine (ZYRTEC) 10 MG tablet Take 10 mg by mouth daily.   dapagliflozin propanediol (FARXIGA) 10 MG TABS tablet Take 1 tablet (10 mg total) by  mouth daily.   Doxylamine Succinate, Sleep, (SLEEP AID PO) Take 1 tablet by mouth at bedtime as needed (sleep).    feeding supplement, ENSURE ENLIVE, (ENSURE ENLIVE) LIQD Take 237 mLs by mouth 2 (two) times daily between meals.   ferrous sulfate 325 (65 FE) MG EC tablet TAKE 1 TABLET BY MOUTH 2 TIMES DAILY WITH A MEAL.   Fluticasone-Salmeterol (ADVAIR DISKUS) 250-50 MCG/DOSE AEPB Inhale 1 puff into the lungs 2 (two) times daily.   guaiFENesin (MUCINEX) 600 MG 12 hr tablet Take 600 mg by mouth 2 (two) times daily as needed for cough.   mirtazapine (REMERON) 15 MG tablet Take 1 tablet  (15 mg total) by mouth at bedtime.   Multiple Vitamin (MULTIVITAMIN WITH MINERALS) TABS tablet Take 1 tablet by mouth daily.   nitroGLYCERIN (NITROSTAT) 0.4 MG SL tablet Place 0.4 mg under the tongue every 5 (five) minutes as needed for chest pain.   omeprazole (PRILOSEC) 40 MG capsule Take 1 capsule (40 mg total) by mouth daily.   Probiotic Product (PROBIOTIC ADVANCED) CAPS Take 1 capsule by mouth daily.   rosuvastatin (CRESTOR) 20 MG tablet Take 1 tablet (20 mg total) by mouth daily. For cholesterol.   sacubitril-valsartan (ENTRESTO) 24-26 MG Take 1 tablet by mouth 2 (two) times daily.   spironolactone (ALDACTONE) 25 MG tablet Take 1 tablet (25 mg total) by mouth daily.   SUMAtriptan (IMITREX) 25 MG tablet Take 1 tablet by mouth at migraine onset, may repeat in 2 hours if headache persists or recurs. Do not exceed 2 tablets in 24 hours.   torsemide 40 MG TABS Take 40 mg by mouth daily. For leg or abdomen swelling   vitamin B-12 (CYANOCOBALAMIN) 1000 MCG tablet Take 1,000 mcg by mouth daily.   No current facility-administered medications on file prior to visit.     Allergies:   Acthar hp [corticotropin], Codeine, and Prednisone   Social History   Tobacco Use   Smoking status: Former    Packs/day: 1.00    Pack years: 0.00    Types: Cigarettes   Smokeless tobacco: Never  Vaping Use   Vaping Use: Never used  Substance Use Topics   Alcohol use: Yes    Comment: Occasional    Family History: family history includes Bipolar disorder in her sister; Heart disease in her brother; Lung cancer in her father; Skin cancer in her mother; Throat cancer in her paternal grandmother; Uterine cancer in her sister.  ROS:   Please see the history of present illness.  Additional pertinent ROS otherwise unremarkable.    EKGs/Labs/Other Studies Reviewed:    The following studies were reviewed today: Echo 3.3.22 1. Left ventricular ejection fraction, by estimation, is 40 to 45%. Left  ventricular  ejection fraction by 3D volume is 47 %. The left ventricle has  mildly decreased function. The left ventricle demonstrates global  hypokinesis. Left ventricular diastolic   parameters are consistent with Grade II diastolic dysfunction  (pseudonormalization). Elevated left ventricular end-diastolic pressure.   2. Right ventricular systolic function is severely reduced. The right  ventricular size is mildly enlarged. There is severely elevated pulmonary  artery systolic pressure. The estimated right ventricular systolic  pressure is 76.1 mmHg.   3. Left atrial size was moderately dilated.   4. Right atrial size was severely dilated.   5. The mitral valve is degenerative with mild to moderate thickening of  the mitral valve leaflets. There is teathering of the MV leaflets due to  mild LV dysfunction. Severe  mitral valve regurgitation. Mild to moderate  mitral stenosis. The mean mitral  valve gradient is 6.5 mmHg.   6. Tricuspid valve regurgitation is moderate to severe.   7. The aortic valve is tricuspid. Aortic valve regurgitation is mild.  Mild aortic valve sclerosis is present, with no evidence of aortic valve  stenosis. Aortic regurgitation PHT measures 499 msec.   8. The inferior vena cava is dilated in size with <50% respiratory  variability, suggesting right atrial pressure of 15 mmHg.   9. There is a trivial pericardial effusion posterior to the left  ventricle.  10. Compared to prior echo, Mitral regurgitation appears worse and RV  dysfunction is now present. There is severe Pulmonary HTN.  Echo 03/20/20  1. Left ventricular ejection fraction, by estimation, is 40 to 45%. The  left ventricle has mildly decreased function. The left ventricle  demonstrates global hypokinesis. The left ventricular internal cavity size  was mildly dilated. Left ventricular  diastolic function could not be evaluated.   2. Right ventricular systolic function is normal. The right ventricular  size is  normal. There is moderately elevated pulmonary artery systolic  pressure.   3. Left atrial size was mildly dilated.   4. Moderate mitral subvalvular thickening/fibrosis.   5. The mitral valve is rheumatic. Moderate to severe mitral valve  regurgitation. Mild mitral stenosis. The mean mitral valve gradient is 7.3  mmHg with average heart rate of 105 bpm.   6. The aortic valve is normal in structure. Aortic valve regurgitation is  not visualized.   7. The inferior vena cava is dilated in size with <50% respiratory  variability, suggesting right atrial pressure of 15 mmHg.   Cath 03/23/20 Hemodynamics: LV end diastolic pressure is moderately elevated. ----Coronary Angiography----- Ost RCA to Dist RCA lesion is 100% stenosed.-The PDA and PL system fills via faint collaterals from the AV groove LCx and LAD septals. Mid LAD-1 lesion is 60% stenosed. Mid LAD-2 lesion is 50% stenosed. Dist LAD lesion is 55% stenosed with 70% stenosed side branch in 2nd Diag. 1st Diag lesion is 70% stenosed. Ost Cx to Prox Cx lesion is 45% stenosed. Prox-MID Cx lesion is 65% stenosed.   MODERATE-SEVERE THREE-VESSEL CAD: 100% proximal RCA occlusion with left-to-right collaterals faintly filling PDA and PL system (AV groove LCx-PL and LAD septal-PDA) Diffuse moderate mid LAD disease with 60% to 50% stenosis. Tandem 50% and 65% proximal and mid LCx with the 65% lesion being the most significant lesion. Moderately elevated LVEDP of 18-20 mmHg   Given the extent of disease in the LAD and LCx, neither lesion is very amenable to PCI, and RCA is chronically occluded.  Given her comorbidities, I do not think she would be a good CABG candidate especially in light of the fact that she would likely require valve surgery as well.  She would not recover well.   Recommendations aggressive medical management.  EKG:  EKG is personally reviewed.  The ekg ordered 05/18/21 demonstrates sinus tachycardia with pulmonary disease  pattern  Recent Labs: 08/14/2020: TSH 1.437 05/18/2021: ALT 16; B Natriuretic Peptide 4,147.9 05/26/2021: Hemoglobin 11.4; Platelets 201 05/28/2021: BUN 19; Creatinine, Ser 1.24; Magnesium 2.1; Potassium 4.2; Sodium 133  Recent Lipid Panel    Component Value Date/Time   CHOL 104 01/08/2021 1016   TRIG 66.0 01/08/2021 1016   HDL 40.20 01/08/2021 1016   CHOLHDL 3 01/08/2021 1016   VLDL 13.2 01/08/2021 1016   LDLCALC 51 01/08/2021 1016    Physical Exam:  VS:  BP 104/60 (BP Location: Left Arm, Patient Position: Sitting, Cuff Size: Normal)   Pulse 78   Resp 18   Ht 5' 3" (1.6 m)   Wt 128 lb (58.1 kg)   SpO2 98%   BMI 22.67 kg/m     Wt Readings from Last 3 Encounters:  05/31/21 128 lb (58.1 kg)  05/28/21 145 lb 8.1 oz (66 kg)  03/04/21 147 lb 12.8 oz (67 kg)    GEN: frail appearing, wheelchair bound HEENT: Normal, moist mucous membranes NECK: No JVD appreciated sitting upright CARDIAC: regular rhythm, normal S1 and S2, no rubs or gallops. 3/6 HS murmur. VASCULAR: Radial and DP pulses 2+ bilaterally. No carotid bruits RESPIRATORY:  distant, prolonged expiratory phase, velcro crackles ABDOMEN: Soft, non-tender, non-distended MUSCULOSKELETAL:  in wheelchair, moves all limbs independently SKIN: Warm and dry, only very trivial edema NEUROLOGIC:  Alert and oriented x 3. PSYCHIATRIC:  Normal affect    ASSESSMENT:    1. Hospital discharge follow-up   2. Ischemic cardiomyopathy   3. Chronic combined systolic and diastolic heart failure ([place])   4. Mitral valve insufficiency, unspecified etiology   5. Coronary artery disease involving native coronary artery of native heart without angina pectoris     PLAN:    Shortness of breath Lower Extremity Edema Recent hospitalization, discharged 05/28/21 We had a very long discussion today, with family present (daughter not able to be present). We discussed her goals and wishes at length. She does not want to consider surgery as she does  not think she will recover from it. We reviewed both her ischemic cardiomyopathy and her severe MR/moderate MS  I discussed both palliative care and hospice with her, as well as DNR. She will discuss with her family. I unfortunately think without an option for anatomic management, such as surgery, this will be a recurrent issue for her  Cardiomyopathy, ischemic Chronic systolic and diastolic heart failure CAD with CTO RCA -tolerating aspirin 81 mg daily -continue rosuvastatin 20 mg daily for hypercholesterolemia -tolerating carvedilol 3.125 mg BID and entresto 24-26 mg BID -continue torsemide 40 mg daily  Severe mitral regurgitation with rheumatic appearing mitral valve, with moderate mitral stenosis -thickened leaflets with rheumatic movement -is not interested in surgery and would not likely be a candidate. With mitral stenosis, not a candidate for mitral clip  Plan for follow up: 3 mos. She will contact me if she wishes to pursue palliative care.  Buford Dresser, MD, PhD, [facility]   Medication Adjustments/Labs and Tests Ordered: Current medicines are reviewed at length with the patient today.  Concerns regarding medicines are outlined above.  No orders of the defined types were placed in this encounter.  No orders of the defined types were placed in this encounter.   Patient Instructions  Medication Instructions:  Your Physician recommend you continue on your current medication as directed.    *If you need a refill on your cardiac medications before your next appointment, please call your pharmacy*   Lab Work: None ordered today   Testing/Procedures: None ordered today   Follow-Up: At [facility], you and your health needs are our priority.  As part of our continuing mission to provide you with exceptional heart care, we have created designated Provider Care Teams.  These Care Teams include your primary Cardiologist (physician) and  Advanced Practice Providers (APPs -  Physician Assistants and Nurse Practitioners) who all work together to provide you with the care you need, when you  need it.  We recommend signing up for the patient portal called "MyChart".  Sign up information is provided on this After Visit Summary.  MyChart is used to connect with patients for Virtual Visits (Telemedicine).  Patients are able to view lab/test results, encounter notes, upcoming appointments, etc.  Non-urgent messages can be sent to your provider as well.   To learn more about what you can do with MyChart, go to [email or web address].    Your next appointment:   3 month(s) @ [address] [place], [place] [address]   The format for your next appointment:   In Person  Provider:   Buford Dresser, MD    Signed, Buford Dresser, MD PhD 05/31/2021    [place]

## 2021-05-31 NOTE — Telephone Encounter (Signed)
Approved.  

## 2021-06-03 ENCOUNTER — Other Ambulatory Visit: Payer: Self-pay | Admitting: Primary Care

## 2021-06-03 DIAGNOSIS — I5043 Acute on chronic combined systolic (congestive) and diastolic (congestive) heart failure: Secondary | ICD-10-CM | POA: Diagnosis not present

## 2021-06-03 DIAGNOSIS — I272 Pulmonary hypertension, unspecified: Secondary | ICD-10-CM | POA: Diagnosis not present

## 2021-06-03 DIAGNOSIS — J439 Emphysema, unspecified: Secondary | ICD-10-CM

## 2021-06-03 DIAGNOSIS — N390 Urinary tract infection, site not specified: Secondary | ICD-10-CM | POA: Diagnosis not present

## 2021-06-03 DIAGNOSIS — I255 Ischemic cardiomyopathy: Secondary | ICD-10-CM | POA: Diagnosis not present

## 2021-06-03 DIAGNOSIS — I110 Hypertensive heart disease with heart failure: Secondary | ICD-10-CM | POA: Diagnosis not present

## 2021-06-03 DIAGNOSIS — J9601 Acute respiratory failure with hypoxia: Secondary | ICD-10-CM | POA: Diagnosis not present

## 2021-06-03 NOTE — Telephone Encounter (Signed)
Called the [employer], I gave her the verbal orders for the patient per Allie Bossier NP's recommendation. She stated she appreciated the call.

## 2021-06-04 ENCOUNTER — Emergency Department: Payer: Medicare Other

## 2021-06-04 ENCOUNTER — Ambulatory Visit: Payer: Medicare Other | Admitting: Primary Care

## 2021-06-04 ENCOUNTER — Inpatient Hospital Stay
Admission: EM | Admit: 2021-06-04 | Discharge: 2021-06-11 | DRG: 682 | Disposition: A | Discharge status: Home Health | Payer: Medicare Other | Attending: Family Medicine | Admitting: Family Medicine

## 2021-06-04 ENCOUNTER — Other Ambulatory Visit: Payer: Self-pay

## 2021-06-04 DIAGNOSIS — R748 Abnormal levels of other serum enzymes: Secondary | ICD-10-CM

## 2021-06-04 DIAGNOSIS — R7401 Elevation of levels of liver transaminase levels: Secondary | ICD-10-CM | POA: Diagnosis not present

## 2021-06-04 DIAGNOSIS — N189 Chronic kidney disease, unspecified: Secondary | ICD-10-CM | POA: Diagnosis not present

## 2021-06-04 DIAGNOSIS — D3610 Benign neoplasm of peripheral nerves and autonomic nervous system, unspecified: Secondary | ICD-10-CM | POA: Diagnosis not present

## 2021-06-04 DIAGNOSIS — K6389 Other specified diseases of intestine: Secondary | ICD-10-CM | POA: Diagnosis not present

## 2021-06-04 DIAGNOSIS — Z801 Family history of malignant neoplasm of trachea, bronchus and lung: Secondary | ICD-10-CM

## 2021-06-04 DIAGNOSIS — D6489 Other specified anemias: Secondary | ICD-10-CM | POA: Diagnosis not present

## 2021-06-04 DIAGNOSIS — I951 Orthostatic hypotension: Secondary | ICD-10-CM | POA: Diagnosis present

## 2021-06-04 DIAGNOSIS — I052 Rheumatic mitral stenosis with insufficiency: Secondary | ICD-10-CM | POA: Diagnosis present

## 2021-06-04 DIAGNOSIS — K219 Gastro-esophageal reflux disease without esophagitis: Secondary | ICD-10-CM | POA: Diagnosis present

## 2021-06-04 DIAGNOSIS — I959 Hypotension, unspecified: Secondary | ICD-10-CM | POA: Diagnosis not present

## 2021-06-04 DIAGNOSIS — I9589 Other hypotension: Secondary | ICD-10-CM | POA: Diagnosis not present

## 2021-06-04 DIAGNOSIS — D500 Iron deficiency anemia secondary to blood loss (chronic): Secondary | ICD-10-CM | POA: Diagnosis present

## 2021-06-04 DIAGNOSIS — E876 Hypokalemia: Secondary | ICD-10-CM | POA: Diagnosis present

## 2021-06-04 DIAGNOSIS — E43 Unspecified severe protein-calorie malnutrition: Secondary | ICD-10-CM | POA: Diagnosis present

## 2021-06-04 DIAGNOSIS — R42 Dizziness and giddiness: Secondary | ICD-10-CM | POA: Diagnosis not present

## 2021-06-04 DIAGNOSIS — I059 Rheumatic mitral valve disease, unspecified: Secondary | ICD-10-CM | POA: Diagnosis not present

## 2021-06-04 DIAGNOSIS — J449 Chronic obstructive pulmonary disease, unspecified: Secondary | ICD-10-CM | POA: Diagnosis present

## 2021-06-04 DIAGNOSIS — Z808 Family history of malignant neoplasm of other organs or systems: Secondary | ICD-10-CM

## 2021-06-04 DIAGNOSIS — G43909 Migraine, unspecified, not intractable, without status migrainosus: Secondary | ICD-10-CM | POA: Diagnosis present

## 2021-06-04 DIAGNOSIS — E860 Dehydration: Secondary | ICD-10-CM | POA: Diagnosis present

## 2021-06-04 DIAGNOSIS — I255 Ischemic cardiomyopathy: Secondary | ICD-10-CM | POA: Diagnosis present

## 2021-06-04 DIAGNOSIS — R634 Abnormal weight loss: Secondary | ICD-10-CM | POA: Diagnosis present

## 2021-06-04 DIAGNOSIS — N6459 Other signs and symptoms in breast: Secondary | ICD-10-CM | POA: Diagnosis present

## 2021-06-04 DIAGNOSIS — R1909 Other intra-abdominal and pelvic swelling, mass and lump: Secondary | ICD-10-CM | POA: Diagnosis present

## 2021-06-04 DIAGNOSIS — R32 Unspecified urinary incontinence: Secondary | ICD-10-CM | POA: Diagnosis present

## 2021-06-04 DIAGNOSIS — N1831 Chronic kidney disease, stage 3a: Secondary | ICD-10-CM | POA: Diagnosis present

## 2021-06-04 DIAGNOSIS — T466X5A Adverse effect of antihyperlipidemic and antiarteriosclerotic drugs, initial encounter: Secondary | ICD-10-CM | POA: Diagnosis present

## 2021-06-04 DIAGNOSIS — I110 Hypertensive heart disease with heart failure: Secondary | ICD-10-CM | POA: Diagnosis not present

## 2021-06-04 DIAGNOSIS — N179 Acute kidney failure, unspecified: Principal | ICD-10-CM | POA: Diagnosis present

## 2021-06-04 DIAGNOSIS — Z7951 Long term (current) use of inhaled steroids: Secondary | ICD-10-CM

## 2021-06-04 DIAGNOSIS — I25118 Atherosclerotic heart disease of native coronary artery with other forms of angina pectoris: Secondary | ICD-10-CM | POA: Diagnosis present

## 2021-06-04 DIAGNOSIS — G35 Multiple sclerosis: Secondary | ICD-10-CM | POA: Diagnosis present

## 2021-06-04 DIAGNOSIS — I519 Heart disease, unspecified: Secondary | ICD-10-CM | POA: Diagnosis not present

## 2021-06-04 DIAGNOSIS — R945 Abnormal results of liver function studies: Secondary | ICD-10-CM | POA: Diagnosis not present

## 2021-06-04 DIAGNOSIS — Z7982 Long term (current) use of aspirin: Secondary | ICD-10-CM

## 2021-06-04 DIAGNOSIS — Z8049 Family history of malignant neoplasm of other genital organs: Secondary | ICD-10-CM

## 2021-06-04 DIAGNOSIS — E861 Hypovolemia: Secondary | ICD-10-CM | POA: Diagnosis present

## 2021-06-04 DIAGNOSIS — I420 Dilated cardiomyopathy: Secondary | ICD-10-CM | POA: Diagnosis not present

## 2021-06-04 DIAGNOSIS — K3189 Other diseases of stomach and duodenum: Secondary | ICD-10-CM | POA: Diagnosis not present

## 2021-06-04 DIAGNOSIS — Z87891 Personal history of nicotine dependence: Secondary | ICD-10-CM

## 2021-06-04 DIAGNOSIS — I272 Pulmonary hypertension, unspecified: Secondary | ICD-10-CM | POA: Diagnosis present

## 2021-06-04 DIAGNOSIS — K5900 Constipation, unspecified: Secondary | ICD-10-CM | POA: Diagnosis present

## 2021-06-04 DIAGNOSIS — Z681 Body mass index (BMI) 19 or less, adult: Secondary | ICD-10-CM | POA: Diagnosis not present

## 2021-06-04 DIAGNOSIS — E878 Other disorders of electrolyte and fluid balance, not elsewhere classified: Secondary | ICD-10-CM | POA: Diagnosis present

## 2021-06-04 DIAGNOSIS — I5022 Chronic systolic (congestive) heart failure: Secondary | ICD-10-CM | POA: Diagnosis not present

## 2021-06-04 DIAGNOSIS — R197 Diarrhea, unspecified: Secondary | ICD-10-CM | POA: Diagnosis not present

## 2021-06-04 DIAGNOSIS — N390 Urinary tract infection, site not specified: Secondary | ICD-10-CM | POA: Diagnosis not present

## 2021-06-04 DIAGNOSIS — E785 Hyperlipidemia, unspecified: Secondary | ICD-10-CM | POA: Diagnosis present

## 2021-06-04 DIAGNOSIS — Z20822 Contact with and (suspected) exposure to covid-19: Secondary | ICD-10-CM | POA: Diagnosis present

## 2021-06-04 DIAGNOSIS — R54 Age-related physical debility: Secondary | ICD-10-CM | POA: Diagnosis present

## 2021-06-04 DIAGNOSIS — R8281 Pyuria: Secondary | ICD-10-CM | POA: Diagnosis present

## 2021-06-04 DIAGNOSIS — R159 Full incontinence of feces: Secondary | ICD-10-CM | POA: Diagnosis present

## 2021-06-04 DIAGNOSIS — T39015A Adverse effect of aspirin, initial encounter: Secondary | ICD-10-CM | POA: Diagnosis present

## 2021-06-04 DIAGNOSIS — I5043 Acute on chronic combined systolic (congestive) and diastolic (congestive) heart failure: Secondary | ICD-10-CM | POA: Diagnosis not present

## 2021-06-04 DIAGNOSIS — J9601 Acute respiratory failure with hypoxia: Secondary | ICD-10-CM | POA: Diagnosis not present

## 2021-06-04 DIAGNOSIS — G8929 Other chronic pain: Secondary | ICD-10-CM | POA: Diagnosis present

## 2021-06-04 DIAGNOSIS — K439 Ventral hernia without obstruction or gangrene: Secondary | ICD-10-CM | POA: Diagnosis not present

## 2021-06-04 DIAGNOSIS — I5042 Chronic combined systolic (congestive) and diastolic (congestive) heart failure: Secondary | ICD-10-CM | POA: Diagnosis present

## 2021-06-04 DIAGNOSIS — Z79899 Other long term (current) drug therapy: Secondary | ICD-10-CM

## 2021-06-04 DIAGNOSIS — E871 Hypo-osmolality and hyponatremia: Secondary | ICD-10-CM | POA: Diagnosis present

## 2021-06-04 DIAGNOSIS — Z8249 Family history of ischemic heart disease and other diseases of the circulatory system: Secondary | ICD-10-CM

## 2021-06-04 HISTORY — DX: Chronic combined systolic (congestive) and diastolic (congestive) heart failure: I50.42

## 2021-06-04 HISTORY — DX: Atherosclerotic heart disease of native coronary artery without angina pectoris: I25.10

## 2021-06-04 HISTORY — DX: Nonrheumatic mitral (valve) insufficiency: I34.0

## 2021-06-04 HISTORY — DX: Chronic obstructive pulmonary disease, unspecified: J44.9

## 2021-06-04 HISTORY — DX: Secondary pulmonary arterial hypertension: I27.21

## 2021-06-04 HISTORY — DX: Ischemic cardiomyopathy: I25.5

## 2021-06-04 LAB — COMPREHENSIVE METABOLIC PANEL
ALT: 48 U/L — ABNORMAL HIGH (ref 0–44)
AST: 59 U/L — ABNORMAL HIGH (ref 15–41)
Albumin: 3.5 g/dL (ref 3.5–5.0)
Alkaline Phosphatase: 173 U/L — ABNORMAL HIGH (ref 38–126)
Anion gap: 9 (ref 5–15)
BUN: 56 mg/dL — ABNORMAL HIGH (ref 6–20)
CO2: 26 mmol/L (ref 22–32)
Calcium: 8.7 mg/dL — ABNORMAL LOW (ref 8.9–10.3)
Chloride: 98 mmol/L (ref 98–111)
Creatinine, Ser: 2.38 mg/dL — ABNORMAL HIGH (ref 0.44–1.00)
GFR, Estimated: 23 mL/min — ABNORMAL LOW (ref >60)
Glucose, Bld: 87 mg/dL (ref 70–99)
Potassium: 3.4 mmol/L — ABNORMAL LOW (ref 3.5–5.1)
Sodium: 133 mmol/L — ABNORMAL LOW (ref 135–145)
Total Bilirubin: 0.8 mg/dL (ref 0.3–1.2)
Total Protein: 7.8 g/dL (ref 6.5–8.1)

## 2021-06-04 LAB — RESP PANEL BY RT-PCR (FLU A&B, COVID) ARPGX2
Influenza A by PCR: NEGATIVE
Influenza B by PCR: NEGATIVE
SARS Coronavirus 2 by RT PCR: NEGATIVE

## 2021-06-04 LAB — BASIC METABOLIC PANEL
Anion gap: 13 (ref 5–15)
BUN: 52 mg/dL — ABNORMAL HIGH (ref 6–20)
CO2: 25 mmol/L (ref 22–32)
Calcium: 8.9 mg/dL (ref 8.9–10.3)
Chloride: 96 mmol/L — ABNORMAL LOW (ref 98–111)
Creatinine, Ser: 2.44 mg/dL — ABNORMAL HIGH (ref 0.44–1.00)
GFR, Estimated: 22 mL/min — ABNORMAL LOW (ref >60)
Glucose, Bld: 91 mg/dL (ref 70–99)
Potassium: 3.7 mmol/L (ref 3.5–5.1)
Sodium: 134 mmol/L — ABNORMAL LOW (ref 135–145)

## 2021-06-04 LAB — URINALYSIS, COMPLETE (UACMP) WITH MICROSCOPIC
Bilirubin Urine: NEGATIVE
Glucose, UA: NEGATIVE mg/dL
Ketones, ur: NEGATIVE mg/dL
Nitrite: NEGATIVE
Protein, ur: NEGATIVE mg/dL
Specific Gravity, Urine: 1.006 (ref 1.005–1.030)
Squamous Epithelial / HPF: NONE SEEN (ref 0–5)
pH: 6 (ref 5.0–8.0)

## 2021-06-04 LAB — CBC
HCT: 38 % (ref 36.0–46.0)
Hemoglobin: 12 g/dL (ref 12.0–15.0)
MCH: 28.4 pg (ref 26.0–34.0)
MCHC: 31.6 g/dL (ref 30.0–36.0)
MCV: 89.8 fL (ref 80.0–100.0)
Platelets: 257 10*3/uL (ref 150–400)
RBC: 4.23 MIL/uL (ref 3.87–5.11)
RDW: 16.5 % — ABNORMAL HIGH (ref 11.5–15.5)
WBC: 6.6 10*3/uL (ref 4.0–10.5)
nRBC: 0 % (ref 0.0–0.2)

## 2021-06-04 LAB — LIPASE, BLOOD: Lipase: 35 U/L (ref 11–51)

## 2021-06-04 LAB — BRAIN NATRIURETIC PEPTIDE: B Natriuretic Peptide: 930 pg/mL — ABNORMAL HIGH (ref 0.0–100.0)

## 2021-06-04 LAB — SAMPLE TO BLOOD BANK

## 2021-06-04 MED ORDER — ENOXAPARIN SODIUM 40 MG/0.4ML IJ SOSY: 40.0000 mg | PREFILLED_SYRINGE | INTRAMUSCULAR | Status: DC

## 2021-06-04 MED ORDER — LACTATED RINGERS IV BOLUS
500.0000 mL | Freq: Once | INTRAVENOUS | Status: AC
  Administered 2021-06-04: 500 mL via INTRAVENOUS

## 2021-06-04 MED ORDER — MIRTAZAPINE 15 MG PO TABS
15.0000 mg | ORAL_TABLET | Freq: Every day | ORAL | Status: DC
  Administered 2021-06-05 – 2021-06-10 (×6): 15 mg via ORAL
  Filled 2021-06-04 (×6): qty 1

## 2021-06-04 MED ORDER — POTASSIUM CHLORIDE 20 MEQ PO PACK
40.0000 meq | PACK | Freq: Once | ORAL | Status: AC
  Administered 2021-06-04: 40 meq via ORAL
  Filled 2021-06-04: qty 2

## 2021-06-04 MED ORDER — PANTOPRAZOLE SODIUM 40 MG PO TBEC
40.0000 mg | DELAYED_RELEASE_TABLET | Freq: Every day | ORAL | Status: DC
  Administered 2021-06-05 – 2021-06-11 (×7): 40 mg via ORAL
  Filled 2021-06-04 (×7): qty 1

## 2021-06-04 MED ORDER — ROSUVASTATIN CALCIUM 20 MG PO TABS
20.0000 mg | ORAL_TABLET | Freq: Every day | ORAL | Status: DC
  Administered 2021-06-05 – 2021-06-07 (×3): 20 mg via ORAL
  Filled 2021-06-04: qty 1
  Filled 2021-06-04 (×2): qty 2
  Filled 2021-06-04: qty 1
  Filled 2021-06-04: qty 2
  Filled 2021-06-04 (×2): qty 1

## 2021-06-04 MED ORDER — NITROGLYCERIN 0.4 MG SL SUBL: 0.4000 mg | SUBLINGUAL_TABLET | SUBLINGUAL | Status: DC | PRN

## 2021-06-04 MED ORDER — ACETAMINOPHEN 650 MG RE SUPP: 650.0000 mg | Freq: Four times a day (QID) | RECTAL | Status: DC | PRN

## 2021-06-04 MED ORDER — ACETAMINOPHEN 325 MG PO TABS
650.0000 mg | ORAL_TABLET | Freq: Once | ORAL | Status: AC
  Administered 2021-06-04: 650 mg via ORAL
  Filled 2021-06-04: qty 2

## 2021-06-04 MED ORDER — ASPIRIN 81 MG PO TBEC: 81.0000 mg | DELAYED_RELEASE_TABLET | Freq: Every day | ORAL | Status: DC

## 2021-06-04 MED ORDER — GUAIFENESIN ER 600 MG PO TB12: 600.0000 mg | ORAL_TABLET | Freq: Two times a day (BID) | ORAL | Status: DC | PRN

## 2021-06-04 MED ORDER — VITAMIN B-12 1000 MCG PO TABS
1000.0000 ug | ORAL_TABLET | Freq: Every day | ORAL | Status: DC
  Administered 2021-06-05 – 2021-06-11 (×7): 1000 ug via ORAL
  Filled 2021-06-04 (×7): qty 1

## 2021-06-04 MED ORDER — MOMETASONE FURO-FORMOTEROL FUM 200-5 MCG/ACT IN AERO
2.0000 | INHALATION_SPRAY | Freq: Two times a day (BID) | RESPIRATORY_TRACT | Status: DC
  Administered 2021-06-05 – 2021-06-11 (×13): 2 via RESPIRATORY_TRACT
  Filled 2021-06-04: qty 8.8

## 2021-06-04 MED ORDER — DAPAGLIFLOZIN PROPANEDIOL 5 MG PO TABS
10.0000 mg | ORAL_TABLET | Freq: Every day | ORAL | Status: DC
  Administered 2021-06-05: 10 mg via ORAL
  Filled 2021-06-04: qty 2

## 2021-06-04 MED ORDER — POTASSIUM CHLORIDE IN NACL 20-0.9 MEQ/L-% IV SOLN
INTRAVENOUS | Status: DC
  Filled 2021-06-04 (×3): qty 1000

## 2021-06-04 MED ORDER — RISAQUAD PO CAPS
1.0000 | ORAL_CAPSULE | Freq: Every day | ORAL | Status: DC
  Administered 2021-06-05 – 2021-06-11 (×7): 1 via ORAL
  Filled 2021-06-04 (×7): qty 1

## 2021-06-04 MED ORDER — CARVEDILOL 3.125 MG PO TABS
3.1250 mg | ORAL_TABLET | Freq: Two times a day (BID) | ORAL | Status: DC
  Administered 2021-06-05: 3.125 mg via ORAL
  Filled 2021-06-04 (×3): qty 1

## 2021-06-04 MED ORDER — ONDANSETRON HCL 4 MG/2ML IJ SOLN
4.0000 mg | Freq: Four times a day (QID) | INTRAMUSCULAR | Status: DC | PRN
Start: 2021-06-04 — End: 2021-06-11

## 2021-06-04 MED ORDER — DOXYLAMINE SUCCINATE (SLEEP) 25 MG PO TABS
25.0000 mg | ORAL_TABLET | Freq: Every evening | ORAL | Status: DC | PRN
  Filled 2021-06-04: qty 1

## 2021-06-04 MED ORDER — ALBUTEROL SULFATE (2.5 MG/3ML) 0.083% IN NEBU: 3.0000 mL | INHALATION_SOLUTION | Freq: Four times a day (QID) | RESPIRATORY_TRACT | Status: DC | PRN

## 2021-06-04 MED ORDER — CALCIUM CARBONATE ANTACID 500 MG PO CHEW: 1.0000 | CHEWABLE_TABLET | Freq: Every day | ORAL | Status: DC | PRN

## 2021-06-04 MED ORDER — SUMATRIPTAN SUCCINATE 50 MG PO TABS
50.0000 mg | ORAL_TABLET | ORAL | Status: DC | PRN
  Filled 2021-06-04 (×2): qty 1

## 2021-06-04 MED ORDER — ADULT MULTIVITAMIN W/MINERALS CH
1.0000 | ORAL_TABLET | Freq: Every day | ORAL | Status: DC
  Administered 2021-06-05 – 2021-06-11 (×7): 1 via ORAL
  Filled 2021-06-04 (×7): qty 1

## 2021-06-04 MED ORDER — LORATADINE 10 MG PO TABS
10.0000 mg | ORAL_TABLET | Freq: Every day | ORAL | Status: DC
  Administered 2021-06-05 – 2021-06-11 (×7): 10 mg via ORAL
  Filled 2021-06-04 (×7): qty 1

## 2021-06-04 MED ORDER — ONDANSETRON HCL 4 MG PO TABS: 4.0000 mg | ORAL_TABLET | Freq: Four times a day (QID) | ORAL | Status: DC | PRN

## 2021-06-04 MED ORDER — FERROUS SULFATE 325 (65 FE) MG PO TABS
325.0000 mg | ORAL_TABLET | Freq: Two times a day (BID) | ORAL | Status: DC
  Administered 2021-06-05 – 2021-06-11 (×13): 325 mg via ORAL
  Filled 2021-06-04 (×13): qty 1

## 2021-06-04 MED ORDER — ACETAMINOPHEN 325 MG PO TABS
650.0000 mg | ORAL_TABLET | Freq: Four times a day (QID) | ORAL | Status: DC | PRN
  Administered 2021-06-05 – 2021-06-08 (×5): 650 mg via ORAL
  Filled 2021-06-04 (×5): qty 2

## 2021-06-04 MED ORDER — SODIUM CHLORIDE 0.9 % IV BOLUS
500.0000 mL | Freq: Once | INTRAVENOUS | Status: AC
  Administered 2021-06-04: 500 mL via INTRAVENOUS

## 2021-06-04 MED ORDER — TRAZODONE HCL 50 MG PO TABS
25.0000 mg | ORAL_TABLET | Freq: Every evening | ORAL | Status: DC | PRN
  Administered 2021-06-06: 25 mg via ORAL
  Filled 2021-06-04: qty 1

## 2021-06-04 NOTE — ED Notes (Signed)
Pt soiled with urine at this time. Pt clean and dried, purewick placed on pt. Pt requesting Tylenol because she states her bottom is sore.

## 2021-06-04 NOTE — ED Provider Notes (Signed)
[facility] Emergency Department Provider Note  ____________________________________________   Event Date/Time   First MD Initiated Contact with Patient 06/04/21 1237     (approximate)  I have reviewed the triage vital signs and the nursing notes.   HISTORY  Chief Complaint Hypotension and Rectal Bleeding    HPI Anita [name] is a 60 y.o. female presents emergency department with explosive diarrhea since approximately 4 AM this morning.  Patient states it looks like seaweed and dark stools.  She denies fever or chills.  She denies abdominal pain.  States she was here last week for other issues.  Past Medical History:  Diagnosis Date   Abnormality of gait 07/26/2013   Acute respiratory distress 03/20/2020   Acute respiratory failure with hypoxia ([place]) 03/20/2020   Allergy    Arthritis    Atypical pneumonia 03/22/2020   Chickenpox    Elevated troponin 03/22/2020   GERD (gastroesophageal reflux disease)    Headache(784.0)    Migraine   Hearing loss    Right ear secondary to infection   History of shingles    Migraines    Multiple sclerosis (HCC)    Obese    Optic neuritis    Prolonged QT interval 03/20/2020    Patient Active Problem List   Diagnosis Date Noted   Mitral valve insufficiency 05/19/2021   UTI (urinary tract infection) 05/19/2021   CHF (congestive heart failure) ([place]) 05/19/2021   Hypoxia 05/19/2021   Anasarca 05/19/2021   Esophageal dysphagia 01/08/2021   Ischemic cardiomyopathy 10/08/2020   Acute on chronic combined systolic and diastolic CHF (congestive heart failure) ([place]) 10/08/2020   Coronary artery disease involving native coronary artery of native heart without angina pectoris 10/08/2020   IDA (iron deficiency anemia) 10/05/2020   Vitamin B12 deficiency 08/23/2020   COPD (chronic obstructive pulmonary disease) ([place]) 06/22/2020   Tobacco abuse 03/22/2020   Acute on chronic HFrEF (heart failure with reduced ejection  fraction) ([place]) 03/22/2020   Pulmonary hypertension ([place]) 03/22/2020   Retroperitoneal mass 03/22/2020   Insomnia 03/22/2020   NSTEMI (non-ST elevated myocardial infarction) ([place]) 03/20/2020   SIRS (systemic inflammatory response syndrome) ([place]) 03/20/2020   Normocytic anemia 03/20/2020   AKI (acute kidney injury) ([place]) 03/20/2020   Gastroesophageal reflux disease 02/15/2018   Chronic migraine without aura with status migrainosus, not intractable 02/15/2018   Osteoarthritis 02/15/2018   Essential hypertension 02/15/2018   Disturbance of skin sensation 03/26/2012   Multiple sclerosis ([place]) 03/26/2012    Past Surgical History:  Procedure Laterality Date   COLONOSCOPY WITH PROPOFOL N/A 08/03/2020   Procedure: COLONOSCOPY WITH PROPOFOL;  Surgeon: [name], MD;  Location: [facility] ENDOSCOPY;  Service: Gastroenterology;  Laterality: N/A;   Ganglioneuroma     Resection   LEFT HEART CATH AND CORONARY ANGIOGRAPHY N/A 03/23/2020   Procedure: LEFT HEART CATH AND CORONARY ANGIOGRAPHY;  Surgeon: Leonie Man, MD;  Location: [place] CV LAB;  Service: Cardiovascular;  Laterality: N/A;   PILONIDAL CYST EXCISION     PILONIDAL CYST EXCISION  1983   TUMOR REMOVAL  2007/2008    Prior to Admission medications   Medication Sig Start Date End Date Taking? Authorizing Provider  acetaminophen (TYLENOL) 325 MG tablet Take 2 tablets (650 mg total) by mouth every 6 (six) hours as needed for mild pain (or Fever >/= 101). 03/28/20   Barton Dubois, MD  albuterol (VENTOLIN HFA) 108 (90 Base) MCG/ACT inhaler Inhale 1-2 puffs into the lungs every 6 (six) hours as needed for  wheezing or shortness of breath. Use sparingly! 12/31/20   [name], NP  aspirin EC 81 MG EC tablet Take 1 tablet (81 mg total) by mouth daily. 03/29/20   Barton Dubois, MD  calcium carbonate (TUMS EX) 750 MG chewable tablet Chew 1 tablet by mouth daily as needed for heartburn.    [provider]  carvedilol (COREG) 3.125  MG tablet TAKE 1 TABLET (3.125 MG TOTAL) BY MOUTH 2 (TWO) TIMES DAILY WITH A MEAL. 01/01/21   Buford Dresser, MD  cetirizine (ZYRTEC) 10 MG tablet Take 10 mg by mouth daily.    [provider]  dapagliflozin propanediol (FARXIGA) 10 MG TABS tablet Take 1 tablet (10 mg total) by mouth daily. 05/29/21 06/28/21  Sidney Ace, MD  Doxylamine Succinate, Sleep, (SLEEP AID PO) Take 1 tablet by mouth at bedtime as needed (sleep).     [provider]  feeding supplement, ENSURE ENLIVE, (ENSURE ENLIVE) LIQD Take 237 mLs by mouth 2 (two) times daily between meals. 03/28/20   Barton Dubois, MD  ferrous sulfate 325 (65 FE) MG EC tablet TAKE 1 TABLET BY MOUTH 2 TIMES DAILY WITH A MEAL. 05/28/21   Earlie Server, MD  Fluticasone-Salmeterol (ADVAIR DISKUS) 250-50 MCG/DOSE AEPB Inhale 1 puff into the lungs 2 (two) times daily. 01/08/21   [name], NP  guaiFENesin (MUCINEX) 600 MG 12 hr tablet Take 600 mg by mouth 2 (two) times daily as needed for cough.    [provider]  mirtazapine (REMERON) 15 MG tablet Take 1 tablet (15 mg total) by mouth at bedtime. 01/08/21   [name], NP  Multiple Vitamin (MULTIVITAMIN WITH MINERALS) TABS tablet Take 1 tablet by mouth daily. 03/29/20   Barton Dubois, MD  nitroGLYCERIN (NITROSTAT) 0.4 MG SL tablet Place 0.4 mg under the tongue every 5 (five) minutes as needed for chest pain. 04/23/20   [provider]  omeprazole (PRILOSEC) 40 MG capsule Take 1 capsule (40 mg total) by mouth daily. 03/04/21   [name], MD  Probiotic Product (PROBIOTIC ADVANCED) CAPS Take 1 capsule by mouth daily.    [provider]  rosuvastatin (CRESTOR) 20 MG tablet Take 1 tablet (20 mg total) by mouth daily. For cholesterol. 05/18/20   [name], NP  sacubitril-valsartan (ENTRESTO) 24-26 MG Take 1 tablet by mouth 2 (two) times daily. 05/28/21 06/27/21  Sidney Ace, MD  spironolactone (ALDACTONE) 25 MG tablet Take 1 tablet (25 mg  total) by mouth daily. 05/29/21 06/28/21  Sidney Ace, MD  SUMAtriptan (IMITREX) 25 MG tablet Take 1 tablet by mouth at migraine onset, may repeat in 2 hours if headache persists or recurs. Do not exceed 2 tablets in 24 hours. 02/15/18   [name], NP  torsemide 40 MG TABS Take 40 mg by mouth daily. For leg or abdomen swelling 05/28/21 06/27/21  Sidney Ace, MD  vitamin B-12 (CYANOCOBALAMIN) 1000 MCG tablet Take 1,000 mcg by mouth daily.    [provider]    Allergies Acthar hp [corticotropin], Codeine, and Prednisone  Family History  Problem Relation Age of Onset   Skin cancer Mother    Lung cancer Father    Uterine cancer Sister    Heart disease Brother    Bipolar disorder Sister    Throat cancer Paternal Grandmother     Social History Social History   Tobacco Use   Smoking status: Former    Packs/day: 1.00    Pack years: 0.00  Types: Cigarettes   Smokeless tobacco: Never  Vaping Use   Vaping Use: Never used  Substance Use Topics   Alcohol use: Yes    Comment: Occasional    Review of Systems  Constitutional: No fever/chills Eyes: No visual changes. ENT: No sore throat. Respiratory: Denies cough Cardiovascular: Denies chest pain Gastrointestinal: Denies abdominal pain, positive dark and diarrhea Genitourinary: Negative for dysuria. Musculoskeletal: Negative for back pain. Skin: Negative for rash. Psychiatric: no mood changes,     ____________________________________________   PHYSICAL EXAM:  VITAL SIGNS: ED Triage Vitals [06/04/21 1212]  Enc Vitals Group     BP 91/63     Pulse Rate 80     Resp 12     Temp 97.9 F (36.6 C)     Temp Source Oral     SpO2 100 %     Weight 140 lb (63.5 kg)     Height 5\' 3"  (1.6 m)     Head Circumference      Peak Flow      Pain Score 0     Pain Loc      Pain Edu?      Excl. in [place]?     Constitutional: Alert and oriented. Well appearing and in no acute distress. Eyes: Conjunctivae  are normal.  Head: Atraumatic. Nose: No congestion/rhinnorhea. Mouth/Throat: Mucous membranes are moist.   Neck:  supple no lymphadenopathy noted Cardiovascular: Normal rate, regular rhythm. Heart sounds are normal Respiratory: Normal respiratory effort.  No retractions, lungs c t a  Abd: soft nontender bs normal all 4 quad GU: deferred Musculoskeletal: FROM all extremities, warm and well perfused Neurologic:  Normal speech and language.  Skin:  Skin is warm, dry and intact. No rash noted. Psychiatric: Mood and affect are normal. Speech and behavior are normal.  ____________________________________________   LABS (all labs ordered are listed, but only abnormal results are displayed)  Labs Reviewed  CBC - Abnormal; Notable for the following components:      Result Value   RDW 16.5 (*)    All other components within normal limits  URINALYSIS, COMPLETE (UACMP) WITH MICROSCOPIC - Abnormal; Notable for the following components:   Color, Urine STRAW (*)    APPearance CLEAR (*)    Hgb urine dipstick MODERATE (*)    Leukocytes,Ua MODERATE (*)    Bacteria, UA RARE (*)    All other components within normal limits  COMPREHENSIVE METABOLIC PANEL - Abnormal; Notable for the following components:   Sodium 133 (*)    Potassium 3.4 (*)    BUN 56 (*)    Creatinine, Ser 2.38 (*)    Calcium 8.7 (*)    AST 59 (*)    ALT 48 (*)    Alkaline Phosphatase 173 (*)    GFR, Estimated 23 (*)    All other components within normal limits  BRAIN NATRIURETIC PEPTIDE - Abnormal; Notable for the following components:   B Natriuretic Peptide 930.0 (*)    All other components within normal limits  LIPASE, BLOOD  BASIC METABOLIC PANEL  SAMPLE TO BLOOD BANK   ____________________________________________   ____________________________________________  RADIOLOGY  Abdomen with chest Ultrasound right upper quadrant  ____________________________________________   PROCEDURES  Procedure(s)  performed: No  Procedures    ____________________________________________   INITIAL IMPRESSION / ASSESSMENT AND PLAN / ED COURSE  Pertinent labs & imaging results that were available during my care of the patient were reviewed by me and considered in my medical decision making (see chart  for details).   Patient is 60 year old female presents with diarrhea.  See HPI.  Physical exam shows patient is stable  DDx: Viral diarrhea, GI bleed, bowel perforation, bowel obstruction  EKG reviewed by me, see physician read  CBC is normal, metabolic panel shows decreased sodium of 133, potassium decreased 3.4, BUN and creatinine have greatly increased since 7 days ago Ua shows moderate leuks rare bacteria  Chest x-ray shows cardiomegaly but no CHF, abdominal x-ray shows no bowel perforation or bowel obstruction, this was reviewed by me confirmed by radiology  Ultrasound right upper quadrant shows gallbladder sludge  With the patient's BUN and creatinine showing AKI, will try to hydrate the patient slowly due to her CHF. Will recheck the bmp and if kidney functions improve will d/c for f/u with her doctor   Care transferred to Dr. Tamala Julian at 7:53 PM.  He is aware of treatment plan.  Anita [name] was evaluated in Emergency Department on 06/04/2021 for the symptoms described in the history of present illness. She was evaluated in the context of the global COVID-19 pandemic, which necessitated consideration that the patient might be at risk for infection with the SARS-CoV-2 virus that causes COVID-19. Institutional protocols and algorithms that pertain to the evaluation of patients at risk for COVID-19 are in a state of rapid change based on information released by regulatory bodies including the CDC and federal and state organizations. These policies and algorithms were followed during the patient's care in the ED.    As part of my medical decision making, I reviewed the following data within  the [place] notes reviewed and incorporated, Labs reviewed , EKG interpreted see physician read, Old chart reviewed, Radiograph reviewed , Evaluated by EM attending Dr. Tamala Julian, Notes from prior ED visits, and [place] Controlled Substance Database  ____________________________________________   FINAL CLINICAL IMPRESSION(S) / ED DIAGNOSES  Final diagnoses:  Elevated liver enzymes      NEW MEDICATIONS STARTED DURING THIS VISIT:  New Prescriptions   No medications on file     Note:  This document was prepared using Dragon voice recognition software and may include unintentional dictation errors.    Versie Starks, PA-C 06/04/21 1954    Lucrezia Starch, MD 06/05/21 [phone]

## 2021-06-04 NOTE — ED Notes (Signed)
Called lab to inquire why lab hasn't gotten blood work in process. States that they will start processing it at this time.

## 2021-06-04 NOTE — ED Notes (Signed)
Pt given a sandwich tray and cola with Manuela Schwartz, [place] approval.

## 2021-06-04 NOTE — H&P (Signed)
Anita [name]   PATIENT NAME: Anita [name]    MR#:  [id number]  DATE OF BIRTH:  1961/06/10  DATE OF ADMISSION:  06/04/2021  PRIMARY CARE PHYSICIAN: [name], NP   Patient is coming from: Home  REQUESTING/REFERRING PHYSICIAN: Hulan Saas, MD  CHIEF COMPLAINT:   Chief Complaint  Patient presents with   Hypotension   Rectal Bleeding    HISTORY OF PRESENT ILLNESS:  Anita [name] is a 60 y.o. Caucasian female with medical history significant for GERD, migraine, coronary artery disease, heart failure with reduced ejection fraction, COPD, GERD and multiple sclerosis, who presented to the emergency room with acute onset of diarrhea with associated black tarry stools per her report without bright red bleeding per rectum.  She admitted to nausea without vomiting as well as crampy abdominal pain with her diarrhea.  No fever or chills.  She noted her blood pressure was low and she was having mild headache, lightheadedness and dizziness.  She denied any dysuria, oliguria, urinary frequency or urgency or flank pain.  She denied taking any recent antibiotics.  The patient was recently admitted here on 611 till 6/21 for acute on chronic combined systolic and diastolic CHF  ED Course: When she came to the ER blood pressure was 98/60 with otherwise normal vital signs.  Labs reviewed mild hyponatremia and hypochloremia.  BUN was 56 and creatinine 2.38 compared to 19/1.24 on 05/28/2021.  Lipase was 35 and AST was slightly elevated at 59 with ALT of 48 and alk phos of 173.  Influenza antigens and COVID-19 PCR came back negative.  UA showed 11-20 WBCs with moderate leukocytes however with negative nitrites.  Urine culture was sent. EKG as reviewed by me : Showed no sinus rhythm with rate of 79 with probable left atrial enlargement and T wave inversion inferiorly Imaging: Chest x-ray showed no acute cardiopulmonary disease. Abdominal and pelvic CT scan revealed: Left upper  retroperitoneal mass measuring 5.6 cm, adjacent to retroperitoneal surgical clips, similar in appearance to prior chest CT in April 2021. 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 Prominent left para-aortic lymph node. Correlate with surgical history and any recent surveillance imaging if performed at another institution.   Small volume of abdominopelvic ascites. No other acute abnormality in the abdomen or pelvis.   Partially visualized skin thickening of the breasts bilaterally, correlate with mammography.  The patient was given 650 mg p.o. Tylenol, 500 mL of IV lactated ringer twice and 500 mill of IV normal saline once.  She will be admitted to a medical bed for further evaluation and management.  PAST MEDICAL HISTORY:   Past Medical History:  Diagnosis Date   Abnormality of gait 07/26/2013   Acute respiratory distress 03/20/2020   Acute respiratory failure with hypoxia ([place]) 03/20/2020   Allergy    Arthritis    Atypical pneumonia 03/22/2020   Chickenpox    Elevated troponin 03/22/2020   GERD (gastroesophageal reflux disease)    Headache(784.0)    Migraine   Hearing loss    Right ear secondary to infection   History of shingles    Migraines    Multiple sclerosis (H[facility])    Obese    Optic neuritis    Prolonged QT interval 03/20/2020  Coronary artery disease, heart failure with reduced ejection fraction, COPD, GERD and multiple sclerosis.  PAST SURGICAL HISTORY:   Past Surgical History:  Procedure Laterality Date   COLONOSCOPY WITH PROPOFOL N/A 08/03/2020  Procedure: COLONOSCOPY WITH PROPOFOL;  Surgeon: [name], MD;  Location[facility] ENDOSCOPY;  Service: Gastroenterology;  Laterality: N/A;   Ganglioneuroma     Resection   LEFT HEART CATH AND CORONARY ANGIOGRAPHY N/A 03/23/2020   Procedure: LEFT HEART CATH AND CORONARY ANGIOGRAPHY;  Surgeon: Leonie Man, MD;  Location: [facility];  Service: Cardiovascular;  Laterality: N/A;    PILONIDAL CYST EXCISION     PILONIDAL CYST EXCISION  1983   TUMOR REMOVAL  2007/2008    SOCIAL HISTORY:   Social History   Tobacco Use   Smoking status: Former    Packs/day: 1.00    Pack years: 0.00    Types: Cigarettes   Smokeless tobacco: Never  Substance Use Topics   Alcohol use: Yes    Comment: Occasional    FAMILY HISTORY:   Family History  Problem Relation Age of Onset   Skin cancer Mother    Lung cancer Father    Uterine cancer Sister    Heart disease Brother    Bipolar disorder Sister    Throat cancer Paternal Grandmother     DRUG ALLERGIES:   Allergies  Allergen Reactions   Acthar Hp [Corticotropin] Other (See Comments)    Throat swelling and coughing up blood   Codeine    Prednisone     REVIEW OF SYSTEMS:   ROS As per history of present illness. All pertinent systems were reviewed above. Constitutional, HEENT, cardiovascular, respiratory, GI, GU, musculoskeletal, neuro, psychiatric, endocrine, integumentary and hematologic systems were reviewed and are otherwise negative/unremarkable except for positive findings mentioned above in the HPI.   MEDICATIONS AT HOME:   Prior to Admission medications   Medication Sig Start Date End Date Taking? Authorizing Provider  acetaminophen (TYLENOL) 325 MG tablet Take 2 tablets (650 mg total) by mouth every 6 (six) hours as needed for mild pain (or Fever >/= 101). 03/28/20   Barton Dubois, MD  albuterol (VENTOLIN HFA) 108 (90 Base) MCG/ACT inhaler Inhale 1-2 puffs into the lungs every 6 (six) hours as needed for wheezing or shortness of breath. Use sparingly! 12/31/20   [name], NP  aspirin EC 81 MG EC tablet Take 1 tablet (81 mg total) by mouth daily. 03/29/20   Barton Dubois, MD  calcium carbonate (TUMS EX) 750 MG chewable tablet Chew 1 tablet by mouth daily as needed for heartburn.    [provider]  carvedilol (COREG) 3.125 MG tablet TAKE 1 TABLET (3.125 MG TOTAL) BY MOUTH 2 (TWO) TIMES DAILY  WITH A MEAL. 01/01/21   Buford Dresser, MD  cetirizine (ZYRTEC) 10 MG tablet Take 10 mg by mouth daily.    [provider]  dapagliflozin propanediol (FARXIGA) 10 MG TABS tablet Take 1 tablet (10 mg total) by mouth daily. 05/29/21 06/28/21  Sidney Ace, MD  Doxylamine Succinate, Sleep, (SLEEP AID PO) Take 1 tablet by mouth at bedtime as needed (sleep).     [provider]  feeding supplement, ENSURE ENLIVE, (ENSURE ENLIVE) LIQD Take 237 mLs by mouth 2 (two) times daily between meals. 03/28/20   Barton Dubois, MD  ferrous sulfate 325 (65 FE) MG EC tablet TAKE 1 TABLET BY MOUTH 2 TIMES DAILY WITH A MEAL. 05/28/21   Earlie Server, MD  Fluticasone-Salmeterol (ADVAIR DISKUS) 250-50 MCG/DOSE AEPB Inhale 1 puff into the lungs 2 (two) times daily. 01/08/21   [name], NP  guaiFENesin (MUCINEX) 600 MG 12 hr tablet Take 600 mg by mouth 2 (two) times daily  as needed for cough.    [provider]  mirtazapine (REMERON) 15 MG tablet Take 1 tablet (15 mg total) by mouth at bedtime. 01/08/21   [name], NP  Multiple Vitamin (MULTIVITAMIN WITH MINERALS) TABS tablet Take 1 tablet by mouth daily. 03/29/20   Barton Dubois, MD  nitroGLYCERIN (NITROSTAT) 0.4 MG SL tablet Place 0.4 mg under the tongue every 5 (five) minutes as needed for chest pain. 04/23/20   [provider]  omeprazole (PRILOSEC) 40 MG capsule Take 1 capsule (40 mg total) by mouth daily. 03/04/21   [name], MD  Probiotic Product (PROBIOTIC ADVANCED) CAPS Take 1 capsule by mouth daily.    [provider]  rosuvastatin (CRESTOR) 20 MG tablet Take 1 tablet (20 mg total) by mouth daily. For cholesterol. 05/18/20   [name], NP  sacubitril-valsartan (ENTRESTO) 24-26 MG Take 1 tablet by mouth 2 (two) times daily. 05/28/21 06/27/21  Sidney Ace, MD  spironolactone (ALDACTONE) 25 MG tablet Take 1 tablet (25 mg total) by mouth daily. 05/29/21 06/28/21  Sidney Ace, MD   SUMAtriptan (IMITREX) 25 MG tablet Take 1 tablet by mouth at migraine onset, may repeat in 2 hours if headache persists or recurs. Do not exceed 2 tablets in 24 hours. 02/15/18   [name], NP  torsemide 40 MG TABS Take 40 mg by mouth daily. For leg or abdomen swelling 05/28/21 06/27/21  Sidney Ace, MD  vitamin B-12 (CYANOCOBALAMIN) 1000 MCG tablet Take 1,000 mcg by mouth daily.    [provider]      VITAL SIGNS:  Blood pressure (!) 90/56, pulse 78, temperature 97.9 F (36.6 C), temperature source Oral, resp. rate (!) 23, height '5\' 3"'  (1.6 m), weight 63.5 kg, SpO2 100 %.  PHYSICAL EXAMINATION:  Physical Exam  GENERAL:  60 y.o.-year-old Caucasian female patient lying in the bed with no acute distress.  EYES: Pupils equal, round, reactive to light and accommodation. No scleral icterus. Extraocular muscles intact.  HEENT: Head atraumatic, normocephalic. Oropharynx and nasopharynx clear.  NECK:  Supple, no jugular venous distention. No thyroid enlargement, no tenderness.  LUNGS: Normal breath sounds bilaterally, no wheezing, rales,rhonchi or crepitation. No use of accessory muscles of respiration.  CARDIOVASCULAR: Regular rate and rhythm, S1, S2 normal. No murmurs, rubs, or gallops.  ABDOMEN: Soft, nondistended, nontender. Bowel sounds present. No organomegaly or mass.  EXTREMITIES: No pedal edema, cyanosis, or clubbing.  NEUROLOGIC: Cranial nerves II through XII are intact. Muscle strength 5/5 in all extremities. Sensation intact. Gait not checked.  PSYCHIATRIC: The patient is alert and oriented x 3.  Normal affect and good eye contact. SKIN: No obvious rash, lesion, or ulcer.   LABORATORY PANEL:   CBC Recent Labs  Lab 06/04/21 1208  WBC 6.6  HGB 12.0  HCT 38.0  PLT 257   ------------------------------------------------------------------------------------------------------------------  Chemistries  Recent Labs  Lab 06/04/21 1208  NA 133*  K 3.4*   CL 98  CO2 26  GLUCOSE 87  BUN 56*  CREATININE 2.38*  CALCIUM 8.7*  AST 59*  ALT 48*  ALKPHOS 173*  BILITOT 0.8   ------------------------------------------------------------------------------------------------------------------  Cardiac Enzymes No results for input(s): TROPONINI in the last 168 hours. ------------------------------------------------------------------------------------------------------------------  RADIOLOGY:  CT ABDOMEN PELVIS WO CONTRAST  Result Date: 06/04/2021 CLINICAL DATA:  Abdominal pain, acute, nonlocalized EXAM: CT ABDOMEN AND PELVIS WITHOUT CONTRAST TECHNIQUE: Multidetector CT imaging of the abdomen and pelvis was performed following the standard protocol without IV contrast. COMPARISON:  CT  abdomen 03/15/2006, CT chest 03/20/2020 FINDINGS: Lower chest: Scarring/linear atelectasis the left lung base. Reticular scarring in the peripheral left lower lobe. Partially visualized bilateral skin thickening in the breasts bilaterally. Hepatobiliary: No focal liver abnormality is seen. Mildly distended gallbladder, without adjacent inflammatory change. Pancreas: Unremarkable. No pancreatic ductal dilatation or surrounding inflammatory changes. Spleen: Normal in size without focal abnormality. Adrenals/Urinary Tract: Again seen is a 5.6 x 4.3 x 3.1 cm left upper retroperitoneal mass (coronal image 42, axial image 28), adjacent to retroperitoneal surgical clips. The right adrenal gland is unremarkable. No hydronephrosis. Postsurgical changes to the upper pole the left kidney. The bladder is mildly distended but unremarkable. Stomach/Bowel: Distended stomach full of ingested contents. There is no evidence of bowel obstruction. The appendix is normal. Scattered colonic diverticuli. No evidence of diverticulitis. Vascular/Lymphatic: Aortic atherosclerotic calcifications. No AAA. Enlarged left periaortic lymph node measuring 1.2 cm (axial image 39). Reproductive: Unremarkable.  Other: Trace perihepatic, perisplenic, and pelvic ascites. Small ventral hernia containing a single wall of the transverse colon. Musculoskeletal: Mild body wall edema. No acute osseous abnormality. Multilevel degenerative disc disease, worst at L4-L5 and T10-T11. IMPRESSION: Left upper retroperitoneal mass measuring 5.6 cm, adjacent to retroperitoneal surgical clips, similar in appearance to prior chest CT in April 2021. 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 Prominent left para-aortic lymph node. Correlate with surgical history and any recent surveillance imaging if performed at another institution. Small volume of abdominopelvic ascites. No other acute abnormality in the abdomen or pelvis. Partially visualized skin thickening of the breasts bilaterally, correlate with mammography. Electronically Signed   By: Maurine Simmering   On: 06/04/2021 21:20   DG Abdomen Acute W/Chest  Result Date: 06/04/2021 CLINICAL DATA:  Rectal bleeding. EXAM: DG ABDOMEN ACUTE WITH 1 VIEW CHEST COMPARISON:  Chest 05/18/2021 FINDINGS: Frontal chest x-ray shows lordotic positioning. Cardiopericardial silhouette is at upper limits of normal for size. The lungs are clear without focal pneumonia, edema, pneumothorax or pleural effusion. The visualized bony structures of the thorax show no acute abnormality. Telemetry leads overlie the chest. Upright film shows no evidence for intraperitoneal free air. Mild gaseous distention of colon in the right pelvis, likely ascending segment. There is no evidence for gaseous bowel dilation to suggest obstruction. Lumbar spine and bony pelvis appear intact. IMPRESSION: No active cardiopulmonary disease. No evidence for bowel perforation or overt obstruction. Electronically Signed   By: Misty Stanley M.D.   On: 06/04/2021 14:50   US Abdomen Limited RUQ (LIVER/GB)  Result Date: 06/04/2021 CLINICAL DATA:  Elevated liver enzymes EXAM: ULTRASOUND ABDOMEN LIMITED RIGHT  UPPER QUADRANT COMPARISON:  CT 03/20/2020, 03/19/2016, CT 03/16/2016 FINDINGS: Gallbladder: Dilated gallbladder containing nonshadowing balls of sludge, largest measuring 1.7 x 1.7 cm. There is no wall thickening or pericholecystic fluid. Negative sonographic Murphy sign. Common bile duct: Diameter: 4.3 mm Liver: No focal lesion identified. Within normal limits in parenchymal echogenicity. Portal vein is patent on color Doppler imaging with normal direction of blood flow towards the liver. Other: None. IMPRESSION: Dilated gallbladder containing nonshadowing sludge balls, largest measuring 1.7 cm. No wall thickening or pericholecystic fluid. Negative sonographic Murphy sign. Correlate with labs and physical exam. Electronically Signed   By: Maurine Simmering   On: 06/04/2021 17:28      IMPRESSION AND PLAN:  Active Problems:   AKI (acute kidney injury) ([place])  1.  Acute kidney injury superimposed on stage IIIa chronic kidney disease likely prerenal secondary to intractable diarrhea. - The patient will  be admitted to a medical bed. - We will continue hydration with IV normal saline with potassium and chloride supplementation. - We will follow her BMPs. - We will await C. difficile PCR that was sent in the ER. - We will utilize Imodium as needed. - We will avoid nephrotoxins. - We will check stool Hemoccults given his subjective melena.  2.  Hypokalemia. - Potassium will be replaced and magnesium level will be checked.  3.  Mild hyponatremia. - This is clearly related to volume depletion. - As above she will be hydrated and sodium level will be checked.  4.  Elevated LFTs. - This could still be related to dehydration.  LFTs will be followed.  5.  Pyuria with rare bacteriuria and no urinary symptoms. - We will await urine culture at this time and avoid antibiotics.  6.  Heart failure with reduced ejection fraction, compensated. - We will continue her Wilder Glade and hold off Entresto and Aldactone  given acute kidney injury.  7.  Dyslipidemia. - We will continue statin therapy.  Elevated LFTs are very mild to hold off.  8.  Coronary artery disease. - We will continue as needed sublingual nitroglycerin, statin therapy as well as beta-blocker therapy,.  9.  COPD. - We will continue her Advair Diskus.  10.  Multiple sclerosis. - The patient is immobile at baseline and is not motivated to move much.  DVT prophylaxis: SCDs. - We will avoid Lovenox for now pending stool Hemoccult Code Status: full code. Family Communication:  The plan of care was discussed in details with the patient (and family). I answered all questions. The patient agreed to proceed with the above mentioned plan. Further management will depend upon hospital course. Disposition Plan: Back to previous home environment Consults called: none. All the records are reviewed and case discussed with ED provider.  Status is: Inpatient  Remains inpatient appropriate because:Ongoing active pain requiring inpatient pain management, Ongoing diagnostic testing needed not appropriate for outpatient work up, Unsafe d/c plan, IV treatments appropriate due to intensity of illness or inability to take PO, and Inpatient level of care appropriate due to severity of illness  Dispo: The patient is from: Home              Anticipated d/c is to: Home              Patient currently is not medically stable to d/c.   Difficult to place patient No   TOTAL TIME TAKING CARE OF THIS PATIENT: 55 minutes.    Christel Mormon M.D on 06/04/2021 at 10:24 PM  [facility]   From 7 PM-7 AM, contact night-coverage [email or web address]  CC: Primary care physician; [name], NP

## 2021-06-04 NOTE — ED Triage Notes (Signed)
Pt via EMS from home pt c/o rectal bleeding this AM. Pt states that she has dark, tarry stool since 0400 this AM. Pt states it looks like seaweed. EMS also reports hypotension systolic in the 56L. EMS gave 37mL of fluid. Pt does have a hx of CHF.

## 2021-06-05 ENCOUNTER — Encounter: Payer: Self-pay | Admitting: Family Medicine

## 2021-06-05 DIAGNOSIS — R748 Abnormal levels of other serum enzymes: Secondary | ICD-10-CM

## 2021-06-05 LAB — BASIC METABOLIC PANEL
Anion gap: 12 (ref 5–15)
BUN: 48 mg/dL — ABNORMAL HIGH (ref 6–20)
CO2: 24 mmol/L (ref 22–32)
Calcium: 9 mg/dL (ref 8.9–10.3)
Chloride: 100 mmol/L (ref 98–111)
Creatinine, Ser: 1.95 mg/dL — ABNORMAL HIGH (ref 0.44–1.00)
GFR, Estimated: 29 mL/min — ABNORMAL LOW (ref >60)
Glucose, Bld: 87 mg/dL (ref 70–99)
Potassium: 4.4 mmol/L (ref 3.5–5.1)
Sodium: 136 mmol/L (ref 135–145)

## 2021-06-05 LAB — HEMOGLOBIN AND HEMATOCRIT, BLOOD
HCT: 33.3 % — ABNORMAL LOW (ref 36.0–46.0)
HCT: 32.8 % — ABNORMAL LOW (ref 36.0–46.0)
Hemoglobin: 10.5 g/dL — ABNORMAL LOW (ref 12.0–15.0)
Hemoglobin: 10.6 g/dL — ABNORMAL LOW (ref 12.0–15.0)

## 2021-06-05 LAB — CBC
HCT: 36.5 % (ref 36.0–46.0)
Hemoglobin: 11.5 g/dL — ABNORMAL LOW (ref 12.0–15.0)
MCH: 28 pg (ref 26.0–34.0)
MCHC: 31.5 g/dL (ref 30.0–36.0)
MCV: 89 fL (ref 80.0–100.0)
Platelets: 220 10*3/uL (ref 150–400)
RBC: 4.1 MIL/uL (ref 3.87–5.11)
RDW: 16.3 % — ABNORMAL HIGH (ref 11.5–15.5)
WBC: 5.7 10*3/uL (ref 4.0–10.5)
nRBC: 0 % (ref 0.0–0.2)

## 2021-06-05 MED ORDER — OXYCODONE HCL 5 MG PO TABS
5.0000 mg | ORAL_TABLET | ORAL | Status: DC | PRN
  Administered 2021-06-05 – 2021-06-11 (×20): 5 mg via ORAL
  Filled 2021-06-05 (×20): qty 1

## 2021-06-05 MED ORDER — LACTATED RINGERS IV BOLUS: 250.0000 mL | Freq: Once | INTRAVENOUS | Status: DC

## 2021-06-05 MED ORDER — SODIUM CHLORIDE 0.9 % IV BOLUS
250.0000 mL | Freq: Once | INTRAVENOUS | Status: AC
  Administered 2021-06-05: 250 mL via INTRAVENOUS

## 2021-06-05 MED ORDER — MIDODRINE HCL 5 MG PO TABS: 5.0000 mg | ORAL_TABLET | Freq: Three times a day (TID) | ORAL | Status: DC

## 2021-06-05 MED ORDER — MORPHINE SULFATE (PF) 2 MG/ML IV SOLN: 2.0000 mg | INTRAVENOUS | Status: DC | PRN

## 2021-06-05 MED ORDER — MIDODRINE HCL 5 MG PO TABS
2.5000 mg | ORAL_TABLET | Freq: Three times a day (TID) | ORAL | Status: DC
  Administered 2021-06-05: 2.5 mg via ORAL
  Filled 2021-06-05: qty 1

## 2021-06-05 MED ORDER — LACTATED RINGERS IV BOLUS
250.0000 mL | Freq: Once | INTRAVENOUS | Status: AC
  Administered 2021-06-05: 250 mL via INTRAVENOUS

## 2021-06-05 MED ORDER — DICLOFENAC SODIUM 1 % EX GEL
2.0000 g | Freq: Four times a day (QID) | CUTANEOUS | Status: DC | PRN
  Administered 2021-06-06: 2 g via TOPICAL
  Filled 2021-06-05: qty 100

## 2021-06-05 NOTE — ED Notes (Signed)
Called Colletta Maryland RN and per RN she has reviewed chart and has no questions at this time.

## 2021-06-05 NOTE — ED Notes (Signed)
The patients diaper checked for stool to obtain occult blood specimen.  Diaper was clean, no stool

## 2021-06-05 NOTE — Progress Notes (Addendum)
PROGRESS NOTE    Anita [name]  INO:[id number] DOB: 15-Apr-1961 DOA: 06/04/2021 PCP: [name], NP    Chief Complaint  Patient presents with   Hypotension   Rectal Bleeding    Brief Narrative:  Anita [name] is Anita [name] 59 y.o. Caucasian female with medical history significant for GERD, migraine, coronary artery disease, heart failure with reduced ejection fraction, COPD, GERD and multiple sclerosis, who presented to the emergency room with acute onset of diarrhea with associated black tarry stools per her report without bright red bleeding per rectum.  Also reported hypotension. The patient was recently admitted here on 6/11 till 6/21 for acute on chronic combined systolic and diastolic CHF.    Assessment & Plan:   Active Problems:   AKI (acute kidney injury) ([place])  1.  Acute kidney injury superimposed on stage IIIa chronic kidney disease - 2/2 diarrhea +/- dehydration with diuresis.  ? GI bleed as well with concern for melena. 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  Hypokalemia. - improved, follow   3.  Mild hyponatremia. - improved with IVF  4.  Elevated LFTs. - This could still be related to dehydration -- trend, follow  -- RUQ [place] with dilated  gallbladder, nonshadowing sludge balls  5.  Pyuria with rare bacteriuria and no urinary symptoms. - We will await urine culture at this time and avoid antibiotics.  6.  Heart failure with reduced ejection fraction, compensated. - We will continue her Wilder Glade for now.  Continue to hold off Entresto and Aldactone given acute kidney injury.  7.  Dyslipidemia. - We will continue statin therapy.  Elevated LFTs are very mild to hold off.  8.  Coronary artery disease. - We will continue as needed sublingual nitroglycerin, statin therapy as well as beta-blocker therapy,.  9.  COPD. - We will continue her Advair Diskus.  10.  Multiple sclerosis. - at baseline has difficulty with strength, balance, bowel/bladder incontinence.  Notes she walks with walker.  DVT prophylaxis: SCD Code Status: full  Family Communication: none at bedside Disposition:   Status is: Inpatient  Remains inpatient appropriate because:Inpatient level of care appropriate due to severity of illness  Dispo: The patient is from: Home              Anticipated d/c is to: Home              Patient currently is not medically stable to d/c.   Difficult to place patient No       Consultants:  none  Procedures:  none  Antimicrobials: Anti-infectives (From admission, onward)    None          Subjective: Feels poorly- this has been the case for the past year Came because of low blood pressure Doesn't get significance of  concern for bleeding in setting of dark stool   Objective: Vitals:   06/05/21 0147 06/05/21 0431 06/05/21 0552 06/05/21 0730  BP: 103/89 (!) 164/63 (!) 102/59 100/64  Pulse: 82 82 75 78  Resp: 20 16 16 12   Temp:    97.7 F (36.5 C)  TempSrc:    Oral  SpO2: 100% 100%  100%  Weight:      Height:        Intake/Output Summary (Last 24 hours) at 06/05/2021 0906 Last data filed at 06/04/2021 1834 Gross per 24 hour  Intake 500 ml  Output --  Net 500 ml   Filed Weights   06/04/21  1212  Weight: 63.5 kg    Examination:  General exam: Appears irritated, but not in acute discomfort Respiratory system: Clear to auscultation. Respiratory effort normal. Cardiovascular system: S1 & S2 heard, RRR.  Gastrointestinal system: Abdomen is nondistended, soft and nontender.  Central nervous system: Alert and oriented. No focal neurological deficits. Extremities: no LEE Skin: No rashes, lesions or ulcers Psychiatry: Judgement and insight appear normal. Mood & affect appropriate.     Data Reviewed: I have personally reviewed following labs and imaging studies  CBC: Recent Labs  Lab 06/04/21 1208 06/05/21 0825  WBC 6.6 5.7  HGB 12.0 11.5*  HCT 38.0 36.5  MCV 89.8 89.0  PLT 257 458    Basic Metabolic Panel: Recent Labs  Lab 06/04/21 1208 06/04/21 2230 06/05/21 0825  NA 133* 134* 136  K 3.4* 3.7 4.4  CL 98 96* 100  CO2 26 25 24   GLUCOSE 87 91 87  BUN 56* 52* 48*  CREATININE 2.38* 2.44* 1.95*  CALCIUM 8.7* 8.9 9.0    GFR: Estimated Creatinine Clearance: 28.2 mL/min (Anita [name]) (by C-G formula based on SCr of 1.95 mg/dL (H)).  Liver Function Tests: Recent Labs  Lab 06/04/21 1208  AST 59*  ALT 48*  ALKPHOS 173*  BILITOT 0.8  PROT 7.8  ALBUMIN 3.5    CBG: No results for input(s): GLUCAP in the last 168 hours.   Recent Results (from the past 240 hour(s))  Resp Panel by RT-PCR (Flu Chimene Salo&B, Covid) Nasopharyngeal Swab     Status: None   Collection Time: 06/04/21  8:23 PM   Specimen: Nasopharyngeal Swab; Nasopharyngeal(NP) swabs in vial transport medium  Result Value Ref Range Status   SARS Coronavirus 2 by RT PCR NEGATIVE NEGATIVE Final    Comment: (NOTE) SARS-CoV-2 target nucleic acids are NOT DETECTED.  The SARS-CoV-2 RNA is generally detectable in upper respiratory specimens during the acute phase of infection. The lowest concentration of SARS-CoV-2 viral copies this assay can detect is 138 copies/mL. Anita [name] negative result does not preclude  SARS-Cov-2 infection and should not be used as the sole basis for treatment or other patient management decisions. Anita [name] negative result may occur with  improper specimen collection/handling, submission of specimen other than nasopharyngeal swab, presence of viral mutation(s) within the areas targeted by this assay, and inadequate number of viral copies(<138 copies/mL). Anita [name] negative result must be combined with clinical observations, patient history, and epidemiological information. The expected result is Negative.  Fact Sheet for Patients:  EntrepreneurPulse.com.au  Fact Sheet for Healthcare Providers:  IncredibleEmployment.be  This test is no t yet approved or cleared by the [place] FDA and  has been authorized for detection and/or diagnosis of SARS-CoV-2 by FDA under an Emergency Use Authorization (EUA). This EUA will remain  in effect (meaning this test can be used) for the duration of  the COVID-19 declaration under Section 564(b)(1) of the Act, 21 U.S.C.section 360bbb-3(b)(1), unless the authorization is terminated  or revoked sooner.       Influenza Della Scrivener by PCR NEGATIVE NEGATIVE Final   Influenza B by PCR NEGATIVE NEGATIVE Final    Comment: (NOTE) The Xpert Xpress SARS-CoV-2/FLU/RSV plus assay is intended as an aid in the diagnosis of influenza from Nasopharyngeal swab specimens and should not be used as Anita [name] sole basis for treatment. Nasal washings and aspirates are unacceptable for Xpert Xpress SARS-CoV-2/FLU/RSV testing.  Fact Sheet for Patients: EntrepreneurPulse.com.au  Fact Sheet for Healthcare Providers: IncredibleEmployment.be  This test is not yet approved or cleared by the [place] FDA and has been authorized for detection and/or diagnosis of SARS-CoV-2 by FDA under an Emergency Use Authorization (EUA). This EUA will remain in effect (meaning this test can be used) for the duration of  the COVID-19 declaration under Section 564(b)(1) of the Act, 21 U.S.C. section 360bbb-3(b)(1), unless the authorization is terminated or revoked.  Performed at [facility], [address] [place], [place] [address]          Radiology Studies: CT ABDOMEN PELVIS WO CONTRAST  Result Date: 06/04/2021 CLINICAL DATA:  Abdominal pain, acute, nonlocalized EXAM: CT ABDOMEN AND PELVIS WITHOUT CONTRAST TECHNIQUE: Multidetector CT imaging of the abdomen and pelvis was performed following the standard protocol without IV contrast. COMPARISON:  CT abdomen 03/15/2006, CT chest 03/20/2020 FINDINGS: Lower chest: Scarring/linear atelectasis the left lung base. Reticular scarring in the peripheral left lower lobe. Partially visualized bilateral skin thickening in the breasts bilaterally. Hepatobiliary: No focal liver abnormality is seen. Mildly distended gallbladder, without adjacent inflammatory change. Pancreas: Unremarkable. No pancreatic ductal dilatation or surrounding inflammatory changes. Spleen: Normal in size without focal abnormality. Adrenals/Urinary Tract: Again seen is Anita [name] 5.6 x 4.3 x 3.1 cm left upper retroperitoneal mass (coronal image 42, axial image 28), adjacent to retroperitoneal surgical clips. The right adrenal gland is unremarkable. No hydronephrosis. Postsurgical changes to the upper pole the left kidney. The bladder is mildly distended but unremarkable. Stomach/Bowel: Distended stomach full of ingested contents. There is no evidence of bowel obstruction. The appendix is normal. Scattered colonic diverticuli. No evidence of diverticulitis. Vascular/Lymphatic: Aortic atherosclerotic calcifications. No AAA. Enlarged left periaortic lymph node measuring 1.2 cm (axial image 39). Reproductive: Unremarkable. Other: Trace perihepatic, perisplenic, and pelvic ascites. Small ventral hernia containing Anita [name] single wall of the transverse colon. Musculoskeletal: Mild body wall edema. No acute osseous  abnormality. Multilevel degenerative disc disease, worst at L4-L5 and T10-T11. IMPRESSION: Left upper retroperitoneal mass measuring 5.6 cm, adjacent to retroperitoneal surgical clips, similar in appearance to prior chest CT in April 2021. Patient had Nikaya Nasby mass in this location in 2007, reportedly Anita [name] ganglioneuroma. Prominent left para-aortic lymph node. Correlate with surgical history and any recent surveillance imaging if performed at another institution. Small volume of abdominopelvic ascites. No other acute abnormality in the abdomen or pelvis. Partially visualized skin thickening of the breasts bilaterally, correlate with mammography. Electronically Signed   By: Maurine Simmering   On: 06/04/2021 21:20   DG Abdomen Acute W/Chest  Result Date: 06/04/2021 CLINICAL DATA:  Rectal bleeding. EXAM: DG ABDOMEN ACUTE WITH 1 VIEW CHEST COMPARISON:  Chest 05/18/2021 FINDINGS: Frontal chest x-ray shows lordotic positioning. Cardiopericardial silhouette is at upper limits of normal for size. The lungs are clear without focal pneumonia, edema, pneumothorax or pleural effusion. The visualized bony structures of the thorax show no acute abnormality. Telemetry leads overlie the chest. Upright film  shows no evidence for intraperitoneal free air. Mild gaseous distention of colon in the right pelvis, likely ascending segment. There is no evidence for gaseous bowel dilation to suggest obstruction. Lumbar spine and bony pelvis appear intact. IMPRESSION: No active cardiopulmonary disease. No evidence for bowel perforation or overt obstruction. Electronically Signed   By: Misty Stanley M.D.   On: 06/04/2021 14:50   US Abdomen Limited RUQ (LIVER/GB)  Result Date: 06/04/2021 CLINICAL DATA:  Elevated liver enzymes EXAM: ULTRASOUND ABDOMEN LIMITED RIGHT UPPER QUADRANT COMPARISON:  CT 03/20/2020, 03/19/2016, CT 03/16/2016 FINDINGS: Gallbladder: Dilated gallbladder containing nonshadowing balls of sludge, largest measuring 1.7 x 1.7 cm. There  is no wall thickening or pericholecystic fluid. Negative sonographic Murphy sign. Common bile duct: Diameter: 4.3 mm Liver: No focal lesion identified. Within normal limits in parenchymal echogenicity. Portal vein is patent on color Doppler imaging with normal direction of blood flow towards the liver. Other: None. IMPRESSION: Dilated gallbladder containing nonshadowing sludge balls, largest measuring 1.7 cm. No wall thickening or pericholecystic fluid. Negative sonographic Murphy sign. Correlate with labs and physical exam. Electronically Signed   By: Maurine Simmering   On: 06/04/2021 17:28        Scheduled Meds:  acidophilus  1 capsule Oral Daily   carvedilol  3.125 mg Oral BID WC   dapagliflozin propanediol  10 mg Oral Daily   ferrous sulfate  325 mg Oral BID WC   loratadine  10 mg Oral Daily   mirtazapine  15 mg Oral QHS   mometasone-formoterol  2 puff Inhalation BID   multivitamin with minerals  1 tablet Oral Daily   pantoprazole  40 mg Oral Daily   rosuvastatin  20 mg Oral Daily   vitamin B-12  1,000 mcg Oral Daily   Continuous Infusions:  0.9 % NaCl with KCl 20 mEq / L 100 mL/hr at 06/04/21 2329     LOS: 1 day    Time spent: over 30 min    Fayrene Helper, MD [facility]   To contact the attending provider between 7A-7P or the covering provider during after hours 7P-7A, please log into the web site [email or web address] and access using universal [place] password for that web site. If you do not have the password, please call the hospital operator.  06/05/2021, 9:06 AM

## 2021-06-05 NOTE — ED Notes (Signed)
The patients diaper changed, remains no stool

## 2021-06-05 NOTE — Evaluation (Addendum)
Physical Therapy Evaluation Patient Details Name: Anita [name] MRN: [id number] DOB: 1961/03/28 Today's Date: 06/05/2021   History of Present Illness  Pt is a 60 y.o. F w/ PMH of migraine, CAD, HF, COPD, GERD, MS presenting to ED for hypotension and acute diarrhea. Recent admission 6/11 to 6/21 for acute on chronic CHF.  Clinical Impression  Pt alert, willing to work with PT with encouragement. PLOF includes ambulation with rollator, able to bath and dress, requires assistance with cooking and getting into and out of the tub shower. Pt able to work against gravity supine, but unable to assess in sitting due to lightheadrfness and hypotension with upright positioning. BP supine pre-activity 87/57, seated 75/57, and supine 90/52 after session.  Skilled PT is recommended due to change in functional status and return to PLOF. Discharge recommendations are SNF due to decline in functional mobility and increased risk of falls. -     Follow Up Recommendations Supervision/Assistance - 24 hour;SNF    Equipment Recommendations  Rolling walker with 5" wheels    Recommendations for Other Services       Precautions / Restrictions Precautions Precautions: Fall Precaution Comments: Enteric precautions, hypotensive w/ sx sitting; Restrictions Weight Bearing Restrictions: No      Mobility  Bed Mobility Overal bed mobility: Needs Assistance Bed Mobility: Supine to Sit     Supine to sit: Min assist Sit to supine: Min assist   General bed mobility comments: Requires bed rails supine <>sit; LE assist sit <> supine    Transfers                 General transfer comment: Not assessed due to hypotensive response with sitting  Ambulation/Gait                Stairs            Wheelchair Mobility    Modified Rankin (Stroke Patients Only)       Balance Overall balance assessment: Needs assistance Sitting-balance support: Feet unsupported;Bilateral upper  extremity supported Sitting balance-Leahy Scale: Good                                       Pertinent Vitals/Pain Faces Pain Scale: Hurts a little bit Pain Location: Left arm Pain Descriptors / Indicators: Numbness Pain Intervention(s): Limited activity within patient's tolerance;Monitored during session;Repositioned    Home Living Family/patient expects to be discharged to:: Private residence Living Arrangements: Children Available Help at Discharge: Family;Available PRN/intermittently (Available for emergencies) Type of Home: House Home Access: Stairs to enter Entrance Stairs-Rails: None Entrance Stairs-Number of Steps: 1 Home Layout: One level Home Equipment: Walker - 4 wheels;Bedside commode;Cane - single point Additional Comments: Lives with daughter who works from home, available for emergencies, cooking    Prior Function Level of Independence: Needs assistance   Gait / Transfers Assistance Needed: Rollator for mobility household distances,  ADL's / Homemaking Assistance Needed: Daughter assists w/ meals, other IADLs. Pt notes independence with bathing, dressing but needs assistance getting into the tub.        Hand Dominance   Dominant Hand: Right    Extremity/Trunk Assessment   Upper Extremity Assessment Upper Extremity Assessment: Overall WFL for tasks assessed (BUE 4+/5 shoulder abd, elbow flex/ext, wrist ext/flex)    Lower Extremity Assessment Lower Extremity Assessment: Overall WFL for tasks assessed (Moves against gravity)       Communication   Communication:  No difficulties  Cognition Arousal/Alertness: Awake/alert Behavior During Therapy: WFL for tasks assessed/performed Overall Cognitive Status: Within Functional Limits for tasks assessed                                 General Comments: AOx4, agitated with condition, cooperative with patience      General Comments      Exercises Other Exercises Other  Exercises: BLE Supine heel slides, hip abduction x 15   Assessment/Plan    PT Assessment Patient needs continued PT services  PT Problem List Decreased strength;Decreased balance;Decreased mobility;Decreased safety awareness;Impaired sensation;Obesity;Decreased activity tolerance       PT Treatment Interventions Gait training;Therapeutic activities;Therapeutic exercise;Balance training;Functional mobility training;Neuromuscular re-education    PT Goals (Current goals can be found in the Care Plan section)  Acute Rehab PT Goals Patient Stated Goal: To feel better PT Goal Formulation: With patient Time For Goal Achievement: 06/19/21 Potential to Achieve Goals: Good    Frequency Min 2X/week   Barriers to discharge        Co-evaluation               AM-PAC PT "6 Clicks" Mobility  Outcome Measure Help needed turning from your back to your side while in a flat bed without using bedrails?: A Little Help needed moving from lying on your back to sitting on the side of a flat bed without using bedrails?: A Little Help needed moving to and from a bed to a chair (including a wheelchair)?: A Lot Help needed standing up from a chair using your arms (e.g., wheelchair or bedside chair)?: A Lot Help needed to walk in hospital room?: Total Help needed climbing 3-5 steps with a railing? : Total 6 Click Score: 12    End of Session Equipment Utilized During Treatment: Gait belt Activity Tolerance: Patient tolerated treatment well Patient left: in bed;with call bell/phone within reach;with bed alarm set;with SCD's reapplied   PT Visit Diagnosis: Muscle weakness (generalized) (M62.81);Other abnormalities of gait and mobility (R26.89);History of falling (Z91.81)    Time: 7048-8891 PT Time Calculation (min) (ACUTE ONLY): 38 min   Charges:         [employer], SPT

## 2021-06-05 NOTE — ED Notes (Signed)
Patients diaper wet, patient cleaned and a new diaper applied

## 2021-06-05 NOTE — Progress Notes (Signed)
Md Mansy,Jan notified of patient having low bp. Patient had bolus on previous shift for bp of 99/59. After bolus bp is 89/60.

## 2021-06-06 ENCOUNTER — Encounter: Payer: Self-pay | Admitting: Cardiology

## 2021-06-06 LAB — CBC WITH DIFFERENTIAL/PLATELET
Abs Immature Granulocytes: 0.02 10*3/uL (ref 0.00–0.07)
Basophils Absolute: 0.1 10*3/uL (ref 0.0–0.1)
Basophils Relative: 1 %
Eosinophils Absolute: 0.2 10*3/uL (ref 0.0–0.5)
Eosinophils Relative: 4 %
HCT: 32.9 % — ABNORMAL LOW (ref 36.0–46.0)
Hemoglobin: 10.6 g/dL — ABNORMAL LOW (ref 12.0–15.0)
Immature Granulocytes: 0 %
Lymphocytes Relative: 16 %
Lymphs Abs: 1 10*3/uL (ref 0.7–4.0)
MCH: 28.3 pg (ref 26.0–34.0)
MCHC: 32.2 g/dL (ref 30.0–36.0)
MCV: 87.7 fL (ref 80.0–100.0)
Monocytes Absolute: 1 10*3/uL (ref 0.1–1.0)
Monocytes Relative: 16 %
Neutro Abs: 3.7 10*3/uL (ref 1.7–7.7)
Neutrophils Relative %: 63 %
Platelets: 247 10*3/uL (ref 150–400)
RBC: 3.75 MIL/uL — ABNORMAL LOW (ref 3.87–5.11)
RDW: 16.3 % — ABNORMAL HIGH (ref 11.5–15.5)
WBC: 5.9 10*3/uL (ref 4.0–10.5)
nRBC: 0 % (ref 0.0–0.2)

## 2021-06-06 LAB — MAGNESIUM: Magnesium: 1.8 mg/dL (ref 1.7–2.4)

## 2021-06-06 LAB — COMPREHENSIVE METABOLIC PANEL
ALT: 48 U/L — ABNORMAL HIGH (ref 0–44)
AST: 57 U/L — ABNORMAL HIGH (ref 15–41)
Albumin: 3.1 g/dL — ABNORMAL LOW (ref 3.5–5.0)
Alkaline Phosphatase: 149 U/L — ABNORMAL HIGH (ref 38–126)
Anion gap: 5 (ref 5–15)
BUN: 41 mg/dL — ABNORMAL HIGH (ref 6–20)
CO2: 26 mmol/L (ref 22–32)
Calcium: 9 mg/dL (ref 8.9–10.3)
Chloride: 102 mmol/L (ref 98–111)
Creatinine, Ser: 1.71 mg/dL — ABNORMAL HIGH (ref 0.44–1.00)
GFR, Estimated: 34 mL/min — ABNORMAL LOW (ref >60)
Glucose, Bld: 84 mg/dL (ref 70–99)
Potassium: 4 mmol/L (ref 3.5–5.1)
Sodium: 133 mmol/L — ABNORMAL LOW (ref 135–145)
Total Bilirubin: 0.7 mg/dL (ref 0.3–1.2)
Total Protein: 6.7 g/dL (ref 6.5–8.1)

## 2021-06-06 LAB — URINE CULTURE

## 2021-06-06 LAB — PHOSPHORUS: Phosphorus: 3.5 mg/dL (ref 2.5–4.6)

## 2021-06-06 MED ORDER — MIDODRINE HCL 5 MG PO TABS
5.0000 mg | ORAL_TABLET | Freq: Three times a day (TID) | ORAL | Status: DC
  Administered 2021-06-06 – 2021-06-11 (×17): 5 mg via ORAL
  Filled 2021-06-06 (×16): qty 1

## 2021-06-06 NOTE — Progress Notes (Addendum)
PROGRESS NOTE    Anita [name]  OXB:[id number] DOB: March 19, 1961 DOA: 06/04/2021 PCP: [name], NP    Chief Complaint  Patient presents with   Hypotension   Rectal Bleeding    Brief Narrative:  Anita [name] is Anita [name] 60 y.o. Caucasian female with medical history significant for GERD, migraine, coronary artery disease, heart failure with reduced ejection fraction, COPD, GERD and multiple sclerosis, who presented to the emergency room with acute onset of diarrhea with associated black tarry stools per her report without bright red bleeding per rectum.  Also reported hypotension. The patient was recently admitted here on 6/11 till 6/21 for acute on chronic combined systolic and diastolic CHF.    Assessment & Plan:   Active Problems:   AKI (acute kidney injury) (Anita [name])   Elevated liver enzymes  1.  Acute kidney injury superimposed on stage IIIa chronic kidney disease - 2/2 diarrhea +/- dehydration with diuresis.  ? GI bleed as well with concern for melena. - improving, follow  - no hydro on CT scan  - strict I/O, daily weights  # Diarrhea  Concern for melena - pending c diff and hemoccult - will d/c both given no stool yet - hb appears relatively stable - continue PPI - no recorded stool since presentation that I see in chart - infectious diarrhea and/or melena are less likely, follow  # Severe Mitral Regurgitation  Heart Failure Given significant orthostasis, will c/s cardiology 6/30, appreciate assistance  # Orthostatic Hypotension Anita [name] high compression stockings Hold antihypertensives Add midodrine and follow   # Left Upper Retroperitoneal Mass  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  Hypokalemia. - improved, follow   3.  Mild hyponatremia. - improved with IVF  4.  Elevated LFTs. - This  could still be related to dehydration -- trend, follow  -- RUQ [place] with dilated gallbladder, nonshadowing sludge balls  5.  Pyuria with rare bacteriuria and no urinary symptoms. - cx with multiple species, follow  6.  Heart failure with reduced ejection fraction, compensated. - Hold farxiga.  Continue to hold off Entresto and Aldactone given acute kidney injury and hypotension.  Hold coreg.  7.  Dyslipidemia. - We will continue statin therapy.  Elevated LFTs are very mild to hold off.  8.  Coronary artery disease. - We will continue as needed sublingual nitroglycerin, statin therapy .  Hold coreg.  9.  COPD. - We will continue her Advair Diskus.  10.  Multiple sclerosis. - at baseline has difficulty with strength, balance, bowel/bladder incontinence.  Notes she walks with walker.  DVT prophylaxis: SCD Code Status: full  Family Communication: brother at bedside Disposition:   Status is: Inpatient  Remains inpatient appropriate because:Inpatient level of care appropriate due to severity of illness  Dispo: The patient is from: Home              Anticipated d/c is to: Home              Patient currently is not medically stable to d/c.   Difficult to place patient No       Consultants:  none  Procedures:  none  Antimicrobials: Anti-infectives (From admission, onward)    None          Subjective: Feels Anita [name] little better today  Objective: Vitals:   06/06/21 0527  06/06/21 0750 06/06/21 1141 06/06/21 1636  BP: 117/68 96/61 (!) 107/53 95/65  Pulse: 89 82 77 83  Resp: 17 17 17 18   Temp: 98.6 F (37 C) 98.4 F (36.9 C) 98.8 F (37.1 C) 98.6 F (37 C)  TempSrc: Oral   Oral  SpO2: 100% 99% 100% 99%  Weight:      Height:        Intake/Output Summary (Last 24 hours) at 06/06/2021 1741 Last data filed at 06/05/2021 1929 Gross per 24 hour  Intake --  Output 500 ml  Net -500 ml   Filed Weights   06/04/21 1212  Weight: 63.5 kg    Examination:  General:  No acute distress. Cardiovascular: Heart sounds show Anita [name] regular rate, and rhythm.  Lungs: Clear to auscultation bilaterally . Abdomen: Soft, nontender, nondistended Neurological: Alert and oriented 3. Moves all extremities 4 . Cranial nerves II through XII grossly intact. Skin: Warm and dry. No rashes or lesions. Extremities: No clubbing or cyanosis. No edema.    Data Reviewed: I have personally reviewed following labs and imaging studies  CBC: Recent Labs  Lab 06/04/21 1208 06/05/21 0825 06/05/21 1409 06/05/21 2029 06/06/21 0417  WBC 6.6 5.7  --   --  5.9  NEUTROABS  --   --   --   --  3.7  HGB 12.0 11.5* 10.5* 10.6* 10.6*  HCT 38.0 36.5 33.3* 32.8* 32.9*  MCV 89.8 89.0  --   --  87.7  PLT 257 220  --   --  628    Basic Metabolic Panel: Recent Labs  Lab 06/04/21 1208 06/04/21 2230 06/05/21 0825 06/06/21 0417  NA 133* 134* 136 133*  K 3.4* 3.7 4.4 4.0  CL 98 96* 100 102  CO2 26 25 24 26   GLUCOSE 87 91 87 84  BUN 56* 52* 48* 41*  CREATININE 2.38* 2.44* 1.95* 1.71*  CALCIUM 8.7* 8.9 9.0 9.0  MG  --   --   --  1.8  PHOS  --   --   --  3.5    GFR: Estimated Creatinine Clearance: 32.2 mL/min (Anita [name]) (by C-G formula based on SCr of 1.71 mg/dL (H)).  Liver Function Tests: Recent Labs  Lab 06/04/21 1208 06/06/21 0417  AST 59* 57*  ALT 48* 48*  ALKPHOS 173* 149*  BILITOT 0.8 0.7  PROT 7.8 6.7  ALBUMIN 3.5 3.1*    CBG: No results for input(s): GLUCAP in the last 168 hours.   Recent Results (from the past 240 hour(s))  Urine Culture     Status: Abnormal   Collection Time: 06/04/21  2:56 PM   Specimen: Urine, Random  Result Value Ref Range Status   Specimen Description   Final    URINE, RANDOM Performed at [facility], [address], [place], [place] [address]    Special Requests   Final    NONE Performed at [facility], [address] [place], [place] [address]    Culture MULTIPLE SPECIES PRESENT, SUGGEST RECOLLECTION (Anita [name])  Final    Report Status 06/06/2021 FINAL  Final  Resp Panel by RT-PCR (Flu Anita [name]&B, Covid) Nasopharyngeal Swab     Status: None   Collection Time: 06/04/21  8:23 PM   Specimen: Nasopharyngeal Swab; Nasopharyngeal(NP) swabs in vial transport medium  Result Value Ref Range Status   SARS Coronavirus 2 by RT PCR NEGATIVE NEGATIVE Final    Comment: (NOTE) SARS-CoV-2 target nucleic acids are NOT DETECTED.  The SARS-CoV-2 RNA is  generally detectable in upper respiratory specimens during the acute phase of infection. The lowest concentration of SARS-CoV-2 viral copies this assay can detect is 138 copies/mL. Breland Elders negative result does not preclude SARS-Cov-2 infection and should not be used as the sole basis for treatment or other patient management decisions. Mikyla Schachter negative result may occur with  improper specimen collection/handling, submission of specimen other than nasopharyngeal swab, presence of viral mutation(s) within the areas targeted by this assay, and inadequate number of viral copies(<138 copies/mL). Danish Ruffins negative result must be combined with clinical observations, patient history, and epidemiological information. The expected result is Negative.  Fact Sheet for Patients:  EntrepreneurPulse.com.au  Fact Sheet for Healthcare Providers:  IncredibleEmployment.be  This test is no t yet approved or cleared by the [place] FDA and  has been authorized for detection and/or diagnosis of SARS-CoV-2 by FDA under an Emergency Use Authorization (EUA). This EUA will remain  in effect (meaning this test can be used) for the duration of the COVID-19 declaration under Section 564(b)(1) of the Act, 21 U.S.C.section 360bbb-3(b)(1), unless the authorization is terminated  or revoked sooner.       Influenza Rakeen Gaillard by PCR NEGATIVE NEGATIVE Final   Influenza B by PCR NEGATIVE NEGATIVE Final    Comment: (NOTE) The Xpert Xpress SARS-CoV-2/FLU/RSV plus assay is intended as an aid in  the diagnosis of influenza from Nasopharyngeal swab specimens and should not be used as Deryck Hippler sole basis for treatment. Nasal washings and aspirates are unacceptable for Xpert Xpress SARS-CoV-2/FLU/RSV testing.  Fact Sheet for Patients: EntrepreneurPulse.com.au  Fact Sheet for Healthcare Providers: IncredibleEmployment.be  This test is not yet approved or cleared by the [place] FDA and has been authorized for detection and/or diagnosis of SARS-CoV-2 by FDA under an Emergency Use Authorization (EUA). This EUA will remain in effect (meaning this test can be used) for the duration of the COVID-19 declaration under Section 564(b)(1) of the Act, 21 U.S.C. section 360bbb-3(b)(1), unless the authorization is terminated or revoked.  Performed at [facility], [address] [place], [place] [address]          Radiology Studies: CT ABDOMEN PELVIS WO CONTRAST  Result Date: 06/04/2021 CLINICAL DATA:  Abdominal pain, acute, nonlocalized EXAM: CT ABDOMEN AND PELVIS WITHOUT CONTRAST TECHNIQUE: Multidetector CT imaging of the abdomen and pelvis was performed following the standard protocol without IV contrast. COMPARISON:  CT abdomen 03/15/2006, CT chest 03/20/2020 FINDINGS: Lower chest: Scarring/linear atelectasis the left lung base. Reticular scarring in the peripheral left lower lobe. Partially visualized bilateral skin thickening in the breasts bilaterally. Hepatobiliary: No focal liver abnormality is seen. Mildly distended gallbladder, without adjacent inflammatory change. Pancreas: Unremarkable. No pancreatic ductal dilatation or surrounding inflammatory changes. Spleen: Normal in size without focal abnormality. Adrenals/Urinary Tract: Again seen is Kelissa Merlin 5.6 x 4.3 x 3.1 cm left upper retroperitoneal mass (coronal image 42, axial image 28), adjacent to retroperitoneal surgical clips. The right adrenal gland is unremarkable. No hydronephrosis.  Postsurgical changes to the upper pole the left kidney. The bladder is mildly distended but unremarkable. Stomach/Bowel: Distended stomach full of ingested contents. There is no evidence of bowel obstruction. The appendix is normal. Scattered colonic diverticuli. No evidence of diverticulitis. Vascular/Lymphatic: Aortic atherosclerotic calcifications. No AAA. Enlarged left periaortic lymph node measuring 1.2 cm (axial image 39). Reproductive: Unremarkable. Other: Trace perihepatic, perisplenic, and pelvic ascites. Small ventral hernia containing Tammee Thielke single wall of the transverse colon. Musculoskeletal: Mild body wall edema. No acute osseous abnormality. Multilevel degenerative disc disease, worst  at L4-L5 and T10-T11. IMPRESSION: Left upper retroperitoneal mass measuring 5.6 cm, adjacent to retroperitoneal surgical clips, similar in appearance to prior chest CT in April 2021. Patient had Traycen Goyer mass in this location in 2007, reportedly Nadina Fomby ganglioneuroma. Prominent left para-aortic lymph node. Correlate with surgical history and any recent surveillance imaging if performed at another institution. Small volume of abdominopelvic ascites. No other acute abnormality in the abdomen or pelvis. Partially visualized skin thickening of the breasts bilaterally, correlate with mammography. Electronically Signed   By: Maurine Simmering   On: 06/04/2021 21:20        Scheduled Meds:  acidophilus  1 capsule Oral Daily   carvedilol  3.125 mg Oral BID WC   ferrous sulfate  325 mg Oral BID WC   loratadine  10 mg Oral Daily   midodrine  5 mg Oral TID WC   mirtazapine  15 mg Oral QHS   mometasone-formoterol  2 puff Inhalation BID   multivitamin with minerals  1 tablet Oral Daily   pantoprazole  40 mg Oral Daily   rosuvastatin  20 mg Oral Daily   vitamin B-12  1,000 mcg Oral Daily   Continuous Infusions:     LOS: 2 days    Time spent: over 30 min    Fayrene Helper, MD [facility]   To contact the attending  provider between 7A-7P or the covering provider during after hours 7P-7A, please log into the web site [email or web address] and access using universal [place] password for that web site. If you do not have the password, please call the hospital operator.  06/06/2021, 5:41 PM

## 2021-06-06 NOTE — TOC Progression Note (Addendum)
Transition of Care [facility]) - Progression Note    Patient Details  Name: Anita [name] MRN: [id number] Date of Birth: 1961-09-27  Transition of Care [facility]) CM/SW [place], RN Phone Number: 06/06/2021, 10:25 AM  Clinical Narrative:   Addendum:  Home health agency is [facility].  As per daughter they are fine with resuming services.  They are refusing return to SNF for rehab at this time.  Patient lives at home with daughter.  Daughter works but is able to assist if/as needed other than work.  Patient has no concerns about going home after discharge.  She has no concerns about getting to appointments, obtaining medications or taking medications as prescribed.  Patient currently has PT home health but is unsure of the agency.  TOC will find the name of agency to resume services if recommended.  TOC contact information given, TOC to follow for discharge needs.         Expected Discharge Plan and Services                                                 Social Determinants of Health (SDOH) Interventions    Readmission Risk Interventions Readmission Risk Prevention Plan 06/06/2021  Transportation Screening Complete  PCP or Specialist Appt within 3-5 Days Complete  HRI or [place] Complete  Social Work Consult for [place] Planning/Counseling Not Complete  SW consult not completed comments RNCM assigned to case  Palliative Care Screening Not Applicable  Medication Review Press photographer) Complete  Some recent data might be hidden

## 2021-06-06 NOTE — Evaluation (Addendum)
Occupational Therapy Evaluation Patient Details Name: Anita [name] MRN: [id number] DOB: 11-19-1961 Today's Date: 06/06/2021    History of Present Illness Pt is a 60 y.o. F w/ PMH of migraine, CAD, HF, COPD, GERD, Anita presenting to ED for hypotension and acute diarrhea. Recent admission 6/11 to 6/21 for acute on chronic CHF.   Clinical Impression   Anita [name] was seen for OT evaluation this date. Prior to hospital admission, pt was MOD I for mobility and ADLs using 4WW. Pt lives with daughter who works from home full time. Pt presents to acute OT demonstrating impaired ADL performance and functional mobility 2/2 decreased activity tolerance, functional strength/ROM/balance deficits. Pt currently requires MOD A don B socks seated EOB. SETUP seated UB grooming/dressing tasks, pt tolerates ~5 mins sitting EOB. Pt refused to attempt mobility requiring feet to touch floor - attempted lateral scoot BUE only, required MAX A. Pt would benefit from skilled OT to address noted impairments and functional limitations (see below for any additional details) in order to maximize safety and independence while minimizing falls risk and caregiver burden. Upon hospital discharge, recommend STR to maximize pt safety and return to PLOF.  Sup: BP 85/58, MAP 67, HR 77 Seated BP 83/55, MAP 66, HR 81 Sup: BP 98/59, MAP 71, HR 86    Follow Up Recommendations  SNF    Equipment Recommendations  Other (comment) (defer to next veneue)    Recommendations for Other Services       Precautions / Restrictions Precautions Precautions: Fall Restrictions Weight Bearing Restrictions: No      Mobility Bed Mobility Overal bed mobility: Needs Assistance Bed Mobility: Supine to Sit;Sit to Supine     Supine to sit: Min assist Sit to supine: Min assist        Transfers Overall transfer level: Needs assistance   Transfers: Lateral/Scoot Transfers          Lateral/Scoot Transfers: Max assist General  transfer comment: pt refused BLE involvement, MAX A using BUE only    Balance Overall balance assessment: Needs assistance Sitting-balance support: Feet unsupported;Bilateral upper extremity supported Sitting balance-Leahy Scale: Good                                     ADL either performed or assessed with clinical judgement   ADL Overall ADL's : Needs assistance/impaired                                       General ADL Comments: MOD A don B socks seated EOB. SETUP seated UB grooming/dressing tasks. Pt refused to attempt mobility requiring feet to touch floor - attempted lateral scoot BUE only, required MAX A      Pertinent Vitals/Pain Pain Assessment: No/denies pain     Hand Dominance Right   Extremity/Trunk Assessment Upper Extremity Assessment Upper Extremity Assessment: Overall WFL for tasks assessed   Lower Extremity Assessment Lower Extremity Assessment: Generalized weakness       Communication Communication Communication: No difficulties   Cognition Arousal/Alertness: Awake/alert Behavior During Therapy: Anxious Overall Cognitive Status: Within Functional Limits for tasks assessed                                 General Comments: AOx4, reports feeling anxious re: mobility  General Comments  Sup: BP 85/58, MAP 67, HR 77. Seated BP 83/55, MAP 66, HR 81. Sup: BP 98/59, MAP 71, HR 86    Exercises Exercises: Other exercises Other Exercises Other Exercises: Pt educated re: OT role, DME recs, d/c recs, falls prevention, importance of mobility for funcitonal strengthening Other Exercises: LBD, sup<>sit, sitting balance/tolerance, lateral scoot   Shoulder Instructions      Home Living Family/patient expects to be discharged to:: Private residence Living Arrangements: Children Available Help at Discharge: Family;Available PRN/intermittently Type of Home: House Home Access: Stairs to enter [employer]  of Steps: 1 Entrance Stairs-Rails: None Home Layout: One level     Bathroom Shower/Tub: International aid/development worker Accessibility: Yes How Accessible: Accessible via walker Home Equipment: [place] - 4 wheels;Bedside commode;Cane - single point   Additional Comments: Lives with daughter who works from home, available for emergencies, cooking      Prior Functioning/Environment Level of Independence: Needs assistance  Gait / Transfers Assistance Needed: Rollator for mobility household distances, ADL's / Homemaking Assistance Needed: Daughter assists w/ meals, other IADLs. Pt notes independence with bathing, dressing but needs assistance getting into the tub.            OT Problem List: Decreased strength;Decreased knowledge of use of DME or AE;Decreased activity tolerance;Impaired balance (sitting and/or standing);Decreased safety awareness;Cardiopulmonary status limiting activity      OT Treatment/Interventions: Self-care/ADL training;Manual therapy;Therapeutic exercise;Modalities;Patient/family education;Energy conservation;DME and/or AE instruction;Therapeutic activities;Balance training    OT Goals(Current goals can be found in the care plan section) Acute Rehab OT Goals Patient Stated Goal: To feel better OT Goal Formulation: With patient Time For Goal Achievement: 06/20/21 Potential to Achieve Goals: Fair ADL Goals Pt Will Perform Grooming: with modified independence;sitting Pt Will Perform Lower Body Dressing: with min assist;sitting/lateral leans Pt Will Transfer to Toilet: with min assist;squat pivot transfer;bedside commode (c LRAD PRN)  OT Frequency: Min 1X/week    AM-PAC OT "6 Clicks" Daily Activity     Outcome Measure Help from another person eating meals?: None Help from another person taking care of personal grooming?: A Little Help from another person toileting, which includes using toliet, bedpan, or urinal?: A Lot Help from another person bathing  (including washing, rinsing, drying)?: A Lot Help from another person to put on and taking off regular upper body clothing?: A Little Help from another person to put on and taking off regular lower body clothing?: A Lot 6 Click Score: 16   End of Session Nurse Communication: Mobility status  Activity Tolerance: Other (comment) (Pt self-limiting) Patient left: in bed;with call bell/phone within reach;with chair alarm set  OT Visit Diagnosis: Unsteadiness on feet (R26.81);Muscle weakness (generalized) (M62.81);Repeated falls (R29.6);History of falling (Z91.81)                Time: 2992-4268 OT Time Calculation (min): 18 min Charges:  OT General Charges $OT Visit: 1 Visit OT Evaluation $OT Eval Low Complexity: 1 Low OT Treatments $Self Care/Home Management : 8-22 mins  Dessie Coma, M.S. OTR/L  06/06/21, 1:06 PM  ascom [phone]

## 2021-06-07 ENCOUNTER — Ambulatory Visit: Payer: Medicare Other | Admitting: Family

## 2021-06-07 ENCOUNTER — Ambulatory Visit: Payer: Medicare Other | Admitting: Primary Care

## 2021-06-07 ENCOUNTER — Encounter: Payer: Self-pay | Admitting: Family Medicine

## 2021-06-07 DIAGNOSIS — I059 Rheumatic mitral valve disease, unspecified: Secondary | ICD-10-CM

## 2021-06-07 DIAGNOSIS — E860 Dehydration: Secondary | ICD-10-CM

## 2021-06-07 DIAGNOSIS — I9589 Other hypotension: Secondary | ICD-10-CM

## 2021-06-07 DIAGNOSIS — D6489 Other specified anemias: Secondary | ICD-10-CM

## 2021-06-07 DIAGNOSIS — I5022 Chronic systolic (congestive) heart failure: Secondary | ICD-10-CM

## 2021-06-07 LAB — CBC WITH DIFFERENTIAL/PLATELET
Abs Immature Granulocytes: 0.02 10*3/uL (ref 0.00–0.07)
Basophils Absolute: 0.1 10*3/uL (ref 0.0–0.1)
Basophils Relative: 1 %
Eosinophils Absolute: 0.1 10*3/uL (ref 0.0–0.5)
Eosinophils Relative: 1 %
HCT: 30.3 % — ABNORMAL LOW (ref 36.0–46.0)
Hemoglobin: 10 g/dL — ABNORMAL LOW (ref 12.0–15.0)
Immature Granulocytes: 0 %
Lymphocytes Relative: 12 %
Lymphs Abs: 0.9 10*3/uL (ref 0.7–4.0)
MCH: 28 pg (ref 26.0–34.0)
MCHC: 33 g/dL (ref 30.0–36.0)
MCV: 84.9 fL (ref 80.0–100.0)
Monocytes Absolute: 1.1 10*3/uL — ABNORMAL HIGH (ref 0.1–1.0)
Monocytes Relative: 14 %
Neutro Abs: 5.6 10*3/uL (ref 1.7–7.7)
Neutrophils Relative %: 72 %
Platelets: 250 10*3/uL (ref 150–400)
RBC: 3.57 MIL/uL — ABNORMAL LOW (ref 3.87–5.11)
RDW: 16.1 % — ABNORMAL HIGH (ref 11.5–15.5)
WBC: 7.7 10*3/uL (ref 4.0–10.5)
nRBC: 0 % (ref 0.0–0.2)

## 2021-06-07 LAB — COMPREHENSIVE METABOLIC PANEL
ALT: 39 U/L (ref 0–44)
AST: 42 U/L — ABNORMAL HIGH (ref 15–41)
Albumin: 3.1 g/dL — ABNORMAL LOW (ref 3.5–5.0)
Alkaline Phosphatase: 142 U/L — ABNORMAL HIGH (ref 38–126)
Anion gap: 7 (ref 5–15)
BUN: 37 mg/dL — ABNORMAL HIGH (ref 6–20)
CO2: 25 mmol/L (ref 22–32)
Calcium: 8.7 mg/dL — ABNORMAL LOW (ref 8.9–10.3)
Chloride: 101 mmol/L (ref 98–111)
Creatinine, Ser: 1.58 mg/dL — ABNORMAL HIGH (ref 0.44–1.00)
GFR, Estimated: 38 mL/min — ABNORMAL LOW (ref >60)
Glucose, Bld: 95 mg/dL (ref 70–99)
Potassium: 4 mmol/L (ref 3.5–5.1)
Sodium: 133 mmol/L — ABNORMAL LOW (ref 135–145)
Total Bilirubin: 0.7 mg/dL (ref 0.3–1.2)
Total Protein: 6.7 g/dL (ref 6.5–8.1)

## 2021-06-07 LAB — PHOSPHORUS: Phosphorus: 3.4 mg/dL (ref 2.5–4.6)

## 2021-06-07 LAB — MAGNESIUM: Magnesium: 1.7 mg/dL (ref 1.7–2.4)

## 2021-06-07 MED ORDER — SODIUM CHLORIDE 0.9 % IV SOLN: INTRAVENOUS | Status: DC

## 2021-06-07 MED ORDER — POLYETHYLENE GLYCOL 3350 17 G PO PACK
17.0000 g | PACK | Freq: Two times a day (BID) | ORAL | Status: DC
  Administered 2021-06-07 – 2021-06-11 (×7): 17 g via ORAL
  Filled 2021-06-07 (×9): qty 1

## 2021-06-07 NOTE — Care Management Important Message (Signed)
Important Message  Patient Details  Name: Anita [name] MRN: [id number] Date of Birth: [birth year]   Medicare Important Message Given:  Yes     Dannette Barbara 06/07/2021, 11:56 AM

## 2021-06-07 NOTE — Progress Notes (Signed)
PROGRESS NOTE    Anita [name]  VHQ:[id number] DOB: 1961/02/07 DOA: 06/04/2021 PCP: Anita Koch, NP    Chief Complaint  Patient presents with   Hypotension   Rectal Bleeding    Brief Narrative:  Anita [name] is Anita [name] 60 y.o. Caucasian female with medical history significant for GERD, migraine, coronary artery disease, heart failure with reduced ejection fraction, COPD, GERD and multiple sclerosis, who presented to the emergency room with acute onset of diarrhea with associated black tarry stools per her report without bright red bleeding per rectum.  Also reported hypotension. The patient was recently admitted here on 6/11 till 6/21 for acute on chronic combined systolic and diastolic CHF.    Assessment & Plan:   Active Problems:   AKI (acute kidney injury) ([place])   Elevated liver enzymes  # Orthostatic Hypotension Seems improved, though still with relative hypotension Thigh high compression stockings (apparently is refusing these) Hold antihypertensives Add midodrine and follow   # Severe Mitral Regurgitation  Heart Failure Given significant orthostasis, will c/s cardiology 6/30, appreciate assistance  1.  Acute kidney injury superimposed on stage IIIa chronic kidney disease - 2/2 diarrhea +/- dehydration with diuresis.  ? GI bleed as well with concern for melena. 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  Hypokalemia. - improved, follow   3.   Mild hyponatremia. - improved with IVF  4.  Elevated LFTs. - This could still be related to dehydration -- trend, follow  -- RUQ [place] with dilated gallbladder, nonshadowing sludge balls  5.  Pyuria with rare bacteriuria and no urinary symptoms. - cx with multiple species, follow  6.  Heart failure with reduced ejection fraction, compensated. - Hold farxiga.  Continue to hold off Entresto and Aldactone given acute kidney injury and hypotension.  Hold coreg.  7.  Dyslipidemia. - We will continue statin therapy.  Elevated LFTs are very mild to hold off.  8.  Coronary artery disease. - We will continue as needed sublingual nitroglycerin, statin therapy .  Hold coreg.  9.  COPD. - We will continue her Advair Diskus.  10.  Multiple sclerosis. - at baseline has difficulty with strength, balance, bowel/bladder incontinence.  Notes she walks with walker.  DVT prophylaxis: SCD Code Status: full  Family Communication: brother at bedside Disposition:   Status is: Inpatient  Remains inpatient appropriate because:Inpatient level of care appropriate due to severity of illness  Dispo: The patient is from: Home              Anticipated d/c is to: Home              Patient currently is not medically stable to d/c.   Difficult to place patient No       Consultants:  none  Procedures:  none  Antimicrobials: Anti-infectives (From admission, onward)    None          Subjective: C/o fogginess this morning nausea  Objective:  Vitals:   06/07/21 0056 06/07/21 0500 06/07/21 0549 06/07/21 0818  BP: 111/64  98/60 99/60  Pulse: (!) 103  95 92  Resp: 18  17 18   Temp: 98.6 F (37 C)  98.9 F (37.2 C) 99.1 F (37.3 C)  TempSrc:    Oral  SpO2: 97%  100% 99%  Weight:  64 kg    Height:        Intake/Output Summary (Last 24 hours) at 06/07/2021 0900 Last data filed at 06/07/2021 0549 Gross per 24 hour  Intake --  Output 825 ml  Net -825 ml   Filed Weights   06/04/21 1212  06/07/21 0500  Weight: 63.5 kg 64 kg    Examination:  General: No acute distress, but looks like she needs to throw up during part of our interview - does not end up throwing up Cardiovascular: RRR Lungs: unlabored Abdomen: Soft, nontender, nondistended Neurological: Alert and oriented 3. Moves all extremities 4. Cranial nerves II through XII grossly intact. Skin: Warm and dry. No rashes or lesions. Extremities: No clubbing or cyanosis. No edema.   Data Reviewed: I have personally reviewed following labs and imaging studies  CBC: Recent Labs  Lab 06/04/21 1208 06/05/21 0825 06/05/21 1409 06/05/21 2029 06/06/21 0417 06/07/21 0335  WBC 6.6 5.7  --   --  5.9 7.7  NEUTROABS  --   --   --   --  3.7 5.6  HGB 12.0 11.5* 10.5* 10.6* 10.6* 10.0*  HCT 38.0 36.5 33.3* 32.8* 32.9* 30.3*  MCV 89.8 89.0  --   --  87.7 84.9  PLT 257 220  --   --  247 841    Basic Metabolic Panel: Recent Labs  Lab 06/04/21 1208 06/04/21 2230 06/05/21 0825 06/06/21 0417 06/07/21 0335  NA 133* 134* 136 133* 133*  K 3.4* 3.7 4.4 4.0 4.0  CL 98 96* 100 102 101  CO2 26 25 24 26 25   GLUCOSE 87 91 87 84 95  BUN 56* 52* 48* 41* 37*  CREATININE 2.38* 2.44* 1.95* 1.71* 1.58*  CALCIUM 8.7* 8.9 9.0 9.0 8.7*  MG  --   --   --  1.8 1.7  PHOS  --   --   --  3.5 3.4    GFR: Estimated Creatinine Clearance: 34.9 mL/min (Ciarra Braddy) (by C-G formula based on SCr of 1.58 mg/dL (H)).  Liver Function Tests: Recent Labs  Lab 06/04/21 1208 06/06/21 0417 06/07/21 0335  AST 59* 57* 42*  ALT 48* 48* 39  ALKPHOS 173* 149* 142*  BILITOT 0.8 0.7 0.7  PROT 7.8 6.7 6.7  ALBUMIN 3.5 3.1* 3.1*    CBG: No results for input(s): GLUCAP in the last 168 hours.   Recent Results (from the past 240 hour(s))  Urine Culture     Status: Abnormal   Collection Time: 06/04/21  2:56 PM   Specimen: Urine, Random  Result Value Ref Range Status   Specimen Description   Final    URINE, RANDOM Performed at [facility],  [address], [place], [place] [address]    Special Requests   Final    NONE Performed at [facility], [address] [place], [place] [address]    Culture MULTIPLE SPECIES PRESENT, SUGGEST RECOLLECTION (Xylon Croom)  Final   Report Status 06/06/2021 FINAL  Final  Resp Panel by RT-PCR (Flu Kayode Petion&B, Covid) Nasopharyngeal Swab     Status: None   Collection Time: 06/04/21  8:23 PM   Specimen: Nasopharyngeal  Swab; Nasopharyngeal(NP) swabs in vial transport medium  Result Value Ref Range Status   SARS Coronavirus 2 by RT PCR NEGATIVE NEGATIVE Final    Comment: (NOTE) SARS-CoV-2 target nucleic acids are NOT DETECTED.  The SARS-CoV-2 RNA is generally detectable in upper respiratory specimens during the acute phase of infection. The lowest concentration of SARS-CoV-2 viral copies this assay can detect is 138 copies/mL. Ugochi Henzler negative result does not preclude SARS-Cov-2 infection and should not be used as the sole basis for treatment or other patient management decisions. Cecely Rengel negative result may occur with  improper specimen collection/handling, submission of specimen other than nasopharyngeal swab, presence of viral mutation(s) within the areas targeted by this assay, and inadequate number of viral copies(<138 copies/mL). Edgardo Petrenko negative result must be combined with clinical observations, patient history, and epidemiological information. The expected result is Negative.  Fact Sheet for Patients:  EntrepreneurPulse.com.au  Fact Sheet for Healthcare Providers:  IncredibleEmployment.be  This test is no t yet approved or cleared by the [place] FDA and  has been authorized for detection and/or diagnosis of SARS-CoV-2 by FDA under an Emergency Use Authorization (EUA). This EUA will remain  in effect (meaning this test can be used) for the duration of the COVID-19 declaration under Section 564(b)(1) of the Act, 21 U.S.C.section 360bbb-3(b)(1), unless the  authorization is terminated  or revoked sooner.       Influenza Librada Castronovo by PCR NEGATIVE NEGATIVE Final   Influenza B by PCR NEGATIVE NEGATIVE Final    Comment: (NOTE) The Xpert Xpress SARS-CoV-2/FLU/RSV plus assay is intended as an aid in the diagnosis of influenza from Nasopharyngeal swab specimens and should not be used as Lus Kriegel sole basis for treatment. Nasal washings and aspirates are unacceptable for Xpert Xpress SARS-CoV-2/FLU/RSV testing.  Fact Sheet for Patients: EntrepreneurPulse.com.au  Fact Sheet for Healthcare Providers: IncredibleEmployment.be  This test is not yet approved or cleared by the [place] FDA and has been authorized for detection and/or diagnosis of SARS-CoV-2 by FDA under an Emergency Use Authorization (EUA). This EUA will remain in effect (meaning this test can be used) for the duration of the COVID-19 declaration under Section 564(b)(1) of the Act, 21 U.S.C. section 360bbb-3(b)(1), unless the authorization is terminated or revoked.  Performed at [facility], [address], [place], [place] [address]          Radiology Studies: No results found.      Scheduled Meds:  acidophilus  1 capsule Oral Daily   ferrous sulfate  325 mg Oral BID WC   loratadine  10 mg Oral Daily   midodrine  5 mg Oral TID WC   mirtazapine  15 mg Oral QHS   mometasone-formoterol  2 puff Inhalation BID   multivitamin with minerals  1 tablet Oral Daily   pantoprazole  40 mg Oral Daily   polyethylene glycol  17 g Oral BID   rosuvastatin  20 mg Oral Daily   vitamin B-12  1,000 mcg Oral Daily   Continuous Infusions:     LOS: 3 days    Time spent: over 30 min    Fayrene Helper, MD [facility]   To contact the attending provider between 7A-7P or the covering provider during after hours 7P-7A, please log into the web site [email or web address] and access using universal [place] password for that web site. If  you do not have the password, please call the hospital operator.  06/07/2021, 9:00 AM

## 2021-06-07 NOTE — TOC Progression Note (Signed)
Transition of Care [facility]) - Progression Note    Patient Details  Name: Anita [name] MRN: [id number] Date of Birth: 1961/04/10  [facility]) CM/SW [place], RN Phone Number: 06/07/2021, 12:57 PM  Clinical Narrative:   Per Malachy Mood at [facility], they are able to resume care for patient after discharge.         Expected Discharge Plan and Services                                                 Social Determinants of Health (SDOH) Interventions    Readmission Risk Interventions Readmission Risk Prevention Plan 06/06/2021  Transportation Screening Complete  PCP or Specialist Appt within 3-5 Days Complete  HRI or [place] Complete  Social Work Consult for [place] Planning/Counseling Not Complete  SW consult not completed comments RNCM assigned to case  Palliative Care Screening Not Applicable  Medication Review Press photographer) Complete  Some recent data might be hidden

## 2021-06-07 NOTE — Consult Note (Signed)
Cardiology Consult    Patient ID: [name] MRN: [id number], DOB/AGE: 14-Dec-1960   Admit date: 06/04/2021 Date of Consult: 06/07/2021  Primary Physician: [name], NP Primary Cardiologist: Buford Dresser, MD Requesting Provider: A. Florene Glen, MD  Patient Profile    Anita [name] is a 60 y.o. female with a history of multivessel CAD, chronic combined systolic and diastolic congestive heart failure, ischemic cardiomyopathy, moderate to severe mitral regurgitation, COPD, anemia, prior tobacco use, and chronic dyspnea who is being seen today for the evaluation of orthostatic hypotension in the setting of outpt diuretic therapy and diarrhea at the request of Dr. Marcelline Deist.  Past Medical History   Past Medical History:  Diagnosis Date   Abnormality of gait 07/26/2013   Acute respiratory failure with hypoxia ([place]) 03/20/2020   Allergy    Arthritis    Atypical pneumonia 03/22/2020   CAD (coronary artery disease)    a. 03/2020 Cath: LM nl, LAD 60/37m 55d, D1 70, D2 70, LCX 45ost/p, 65p, RCA 100ost CTO. RPDA fills via collats from dLAD. Inf septal fills via collats from 1st septal, RPAV fills via collats from LPAV, RPL1/2 sev dzs-->med rx.   Chickenpox    Chronic combined systolic (congestive) and diastolic (congestive) heart failure ([place]    a. 02/2021 Echo: EF 40-45%, glob HK, gr2 DD. Sev red RV fxn, RVSP 69.280mg. Mod dil LA, sev dil RA. Sev MR. Mild to mod MS. Mean MV grad 6.32m37m. Mod-Sev TR. Mild AI. Mild Ao sclerosis w/o stenosis.   COPD (chronic obstructive pulmonary disease) (HCC)    Elevated troponin 03/22/2020   GERD (gastroesophageal reflux disease)    Headache(784.0)    Migraine   Hearing loss    Right ear secondary to infection   History of shingles    Ischemic cardiomyopathy    a. 02/2021 Echo: EF 40-45%, glob HK.   Migraines    Multiple sclerosis (HCC)    Obese    Optic neuritis    PAH (pulmonary artery hypertension) ([facility]  a. 02/2021  Echo: RVSP 69.2mm48m   Prolonged QT interval 03/20/2020   Severe mitral regurgitation    a. 02/2021 Echo: Severe MR w/ mild to mod MS.  Mean MV grad 6.32mmH49m   Past Surgical History:  Procedure Laterality Date   COLONOSCOPY WITH PROPOFOL N/A 08/03/2020   Procedure: COLONOSCOPY WITH PROPOFOL;  Surgeon: Anna,[name]  [facility];  Service: Gastroenterology;  Laterality: N/A;   Ganglioneuroma     Resection   LEFT HEART CATH AND CORONARY ANGIOGRAPHY N/A 03/23/2020   Procedure: LEFT HEART CATH AND CORONARY ANGIOGRAPHY;  Surgeon: HardiLeonie Man  [facility];  Service: Cardiovascular;  Laterality: N/A;   PILONIDAL CYST EXCISION     PILONIDAL CYST EXCISION  1983   TUMOR REMOVAL  2007/2008     Allergies  Allergies  Allergen Reactions   Acthar Hp [Corticotropin] Other (See Comments)    Throat swelling and coughing up blood   Codeine    Prednisone     History of Present Illness    Anita [name] 60 y.75 female with a history of multivessel CAD, chronic combined systolic and diastolic congestive heart failure, ischemic cardiomyopathy, moderate to severe mitral regurgitation, COPD, anemia, prior tobacco use, and chronic dyspnea. She underwent a cardiac catheterization in 03/2020, revealing moderate to severe multivessel CAD w/ a CTO of the RCA and L  L collaterals.  She was also noted to have  mod to sev MR and mod MS.  She has not been felt to be a surgical or mitraclip candidate, and has been medically managed.  Most recent echo in 02/2021 showed LVEF 40-45% global hypokinesis, gr2 DD, sev reduced RV fxn, RVSP 69.41mHg. Mod dil LA, sev dil RA. Sev MR. Mild to mod MS. Mean MV grad 6.521mg. Mod-Sev TR. Mild AI. Mild Ao sclerosis w/o stenosis.    Patient was last admitted to the hospital on 6/11 for volume overload and acute on chronic hypoxic respiratory failure.  She was massively overloaded w/ anasarca.  She responded exceptionally well to diuresis  and initiation of entresto, spironolactone, and farxiga.  She discharged home on 6/21 at 66 kg.  She f/u w/ Dr. ChHarrell Gaven 6/24.  Pt was feeling well @ that time.  BP was 104/60 and wt dropped further to 58.1kg.  No med changes were made.  Pt says that she felt well last weekend, but on the morning of 6/28, she awoke and noted that she was covered in dark stool.  She reports having had about 4 incontinent episodes of dark tarry stools before taking an Imodium. Home health arrived late morning and found her to be hypotensive.  EMS was called and pt was taken to the ED.  On arrival to the ED, blood pressure was 98/60 with otherwise normal vital signs. Labs reviewed Na 133, K 3.4, BUN 56, Creatinine 2.38, compared to 19/1.24 on 6/21. Lipase 35, Alk phos 173, AST 59, AST 48. UA showed 11-20 WBCs with moderate leukocytes, negatives for nitrites. She has received multiple boluses but only 500 ml were recorded.  She was admitted to the hospitalist service and home doses of carvedilol, Entresto and Aldactone have been held.  BPs have remained soft (90's to low 100's), and midodrine has been initiated.  She is currently asymptomatic.  Inpatient Medications     acidophilus  1 capsule Oral Daily   ferrous sulfate  325 mg Oral BID WC   loratadine  10 mg Oral Daily   midodrine  5 mg Oral TID WC   mirtazapine  15 mg Oral QHS   mometasone-formoterol  2 puff Inhalation BID   multivitamin with minerals  1 tablet Oral Daily   pantoprazole  40 mg Oral Daily   polyethylene glycol  17 g Oral BID   rosuvastatin  20 mg Oral Daily   vitamin B-12  1,000 mcg Oral Daily    Family History    Family History  Problem Relation Age of Onset   Skin cancer Mother    Lung cancer Father    Uterine cancer Sister    Heart disease Brother    Bipolar disorder Sister    Throat cancer Paternal Grandmother    She indicated that her mother is alive. She indicated that her father is deceased. She indicated that both of her  sisters are alive. She indicated that her brother is alive. She indicated that the status of her paternal grandmother is unknown.   Social History    Social History   Socioeconomic History   Marital status: Divorced    Spouse name: Not on file   Number of children: Not on file   Years of education: Not on file   Highest education level: Not on file  Occupational History   Not on file  Tobacco Use   Smoking status: Former    Packs/day: 1.00    Pack years: 0.00    Types: Cigarettes   Smokeless tobacco:  Never  Vaping Use   Vaping Use: Never used  Substance and Sexual Activity   Alcohol use: Yes    Comment: Occasional   Drug use: Not on file   Sexual activity: Not on file  Other Topics Concern   Not on file  Social History Narrative   Not on file   Social Determinants of Health   Financial Resource Strain: Not on file  Food Insecurity: Not on file  Transportation Needs: Not on file  Physical Activity: Not on file  Stress: Not on file  Social Connections: Not on file  Intimate Partner Violence: Not on file     Review of Systems    General:  No chills, fever, night sweats or weight changes.  Cardiovascular:  No chest pain, dyspnea on exertion, edema, orthopnea, palpitations, paroxysmal nocturnal dyspnea. Dermatological: No rash, lesions/masses Respiratory: No cough, dyspnea Urologic: No hematuria, dysuria Abdominal:   No nausea, vomiting, +++ diarrhea - resolved, +++ dark stool on Tuesday.  No bright red blood per rectum, melena, or hematemesis Neurologic:  No visual changes, wkns, changes in mental status. All other systems reviewed and are otherwise negative except as noted above.  Physical Exam    Blood pressure 104/66, pulse 92, temperature 98.5 F (36.9 C), resp. rate 16, height '5\' 3"'  (1.6 m), weight 64 kg, SpO2 97 %.  General: Pleasant, NAD Psych: Normal affect. Neuro: Alert and oriented X 3. Moves all extremities spontaneously. HEENT: Normal  Neck:  Supple without bruits or JVD. Lungs:  Resp regular and unlabored, CTA. Heart: RRR, 3/6 syst murmur, loudest @ the apex  L axilla.  No s3, s4, or murmurs. Abdomen: Soft, non-tender, non-distended, BS + x 4.  Extremities: No clubbing, cyanosis or edema. DP/PT2+, Radials 2+ and equal bilaterally.  Labs    Cardiac Enzymes Recent Labs  Lab 05/18/21 2205 05/18/21 2356  TROPONINIHS 20* 18*      Lab Results  Component Value Date   WBC 7.7 06/07/2021   HGB 10.0 (L) 06/07/2021   HCT 30.3 (L) 06/07/2021   MCV 84.9 06/07/2021   PLT 250 06/07/2021    Recent Labs  Lab 06/07/21 0335  NA 133*  K 4.0  CL 101  CO2 25  BUN 37*  CREATININE 1.58*  CALCIUM 8.7*  PROT 6.7  BILITOT 0.7  ALKPHOS 142*  ALT 39  AST 42*  GLUCOSE 95   Lab Results  Component Value Date   CHOL 104 01/08/2021   HDL 40.20 01/08/2021   LDLCALC 51 01/08/2021   TRIG 66.0 01/08/2021    Radiology Studies    CT ABDOMEN PELVIS WO CONTRAST  Result Date: 06/04/2021 CLINICAL DATA:  Abdominal pain, acute, nonlocalized EXAM: CT ABDOMEN AND PELVIS WITHOUT CONTRAST TECHNIQUE: Multidetector CT imaging of the abdomen and pelvis was performed following the standard protocol without IV contrast. COMPARISON:  CT abdomen 03/15/2006, CT chest 03/20/2020 FINDINGS: Lower chest: Scarring/linear atelectasis the left lung base. Reticular scarring in the peripheral left lower lobe. Partially visualized bilateral skin thickening in the breasts bilaterally. Hepatobiliary: No focal liver abnormality is seen. Mildly distended gallbladder, without adjacent inflammatory change. Pancreas: Unremarkable. No pancreatic ductal dilatation or surrounding inflammatory changes. Spleen: Normal in size without focal abnormality. Adrenals/Urinary Tract: Again seen is a 5.6 x 4.3 x 3.1 cm left upper retroperitoneal mass (coronal image 42, axial image 28), adjacent to retroperitoneal surgical clips. The right adrenal gland is unremarkable. No hydronephrosis.  Postsurgical changes to the upper pole the left kidney. The bladder is mildly  distended but unremarkable. Stomach/Bowel: Distended stomach full of ingested contents. There is no evidence of bowel obstruction. The appendix is normal. Scattered colonic diverticuli. No evidence of diverticulitis. Vascular/Lymphatic: Aortic atherosclerotic calcifications. No AAA. Enlarged left periaortic lymph node measuring 1.2 cm (axial image 39). Reproductive: Unremarkable. Other: Trace perihepatic, perisplenic, and pelvic ascites. Small ventral hernia containing a single wall of the transverse colon. Musculoskeletal: Mild body wall edema. No acute osseous abnormality. Multilevel degenerative disc disease, worst at L4-L5 and T10-T11. IMPRESSION: Left upper retroperitoneal mass measuring 5.6 cm, adjacent to retroperitoneal surgical clips, similar in appearance to prior chest CT in April 2021. 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 Prominent left para-aortic lymph node. Correlate with surgical history and any recent surveillance imaging if performed at another institution. Small volume of abdominopelvic ascites. No other acute abnormality in the abdomen or pelvis. Partially visualized skin thickening of the breasts bilaterally, correlate with mammography. Electronically Signed   By: Maurine Simmering   On: 06/04/2021 21:20   US Venous Img Upper Uni Left  Result Date: 05/18/2021 CLINICAL DATA:  Edema EXAM: LEFT UPPER EXTREMITY VENOUS DOPPLER ULTRASOUND TECHNIQUE: Gray-scale sonography with graded compression, as well as color Doppler and duplex ultrasound were performed to evaluate the upper extremity deep venous system from the level of the subclavian vein and including the jugular, axillary, basilic, radial, ulnar and upper cephalic vein. Spectral Doppler was utilized to evaluate flow at rest and with distal augmentation maneuvers. COMPARISON:  None. FINDINGS: Contralateral Subclavian Vein: Respiratory  phasicity is normal and symmetric with the symptomatic side. No evidence of thrombus. Normal compressibility. Internal Jugular Vein: No evidence of thrombus. Normal compressibility, respiratory phasicity and response to augmentation. Subclavian Vein: No evidence of thrombus. Normal compressibility, respiratory phasicity and response to augmentation. Axillary Vein: No evidence of thrombus. Normal compressibility, respiratory phasicity and response to augmentation. Cephalic Vein: No evidence of thrombus. Normal compressibility, respiratory phasicity and response to augmentation. Basilic Vein: No evidence of thrombus. Normal compressibility, respiratory phasicity and response to augmentation. Brachial Veins: No evidence of thrombus. Normal compressibility, respiratory phasicity and response to augmentation. Radial Veins: No evidence of thrombus. Normal compressibility, respiratory phasicity and response to augmentation. Ulnar Veins: No evidence of thrombus. Normal compressibility, respiratory phasicity and response to augmentation. Venous Reflux:  None visualized. Other Findings:  None visualized. IMPRESSION: No evidence of DVT within the left upper extremity. Diffuse subcutaneous edema. Electronically Signed   By: Dorise Bullion III M.D   On: 05/18/2021 23:58   DG Chest Port 1 View  Result Date: 05/18/2021 CLINICAL DATA:  Shortness of breath EXAM: PORTABLE CHEST 1 VIEW COMPARISON:  01/08/2021 FINDINGS: Cardiomegaly. Bilateral airspace disease, right greater than left. Probable layering effusions. No acute bony abnormality. IMPRESSION: Bilateral airspace disease with cardiomegaly and probable layering effusions. Findings could reflect edema or infection. Electronically Signed   By: Rolm Baptise M.D.   On: 05/18/2021 22:03   DG Abdomen Acute W/Chest  Result Date: 06/04/2021 CLINICAL DATA:  Rectal bleeding. EXAM: DG ABDOMEN ACUTE WITH 1 VIEW CHEST COMPARISON:  Chest 05/18/2021 FINDINGS: Frontal chest x-ray shows  lordotic positioning. Cardiopericardial silhouette is at upper limits of normal for size. The lungs are clear without focal pneumonia, edema, pneumothorax or pleural effusion. The visualized bony structures of the thorax show no acute abnormality. Telemetry leads overlie the chest. Upright film shows no evidence for intraperitoneal free air. Mild gaseous distention of colon in the right pelvis, likely ascending segment. There is no evidence for  gaseous bowel dilation to suggest obstruction. Lumbar spine and bony pelvis appear intact. IMPRESSION: No active cardiopulmonary disease. No evidence for bowel perforation or overt obstruction. Electronically Signed   By: Misty Stanley M.D.   On: 06/04/2021 14:50   US Abdomen Limited RUQ (LIVER/GB)  Result Date: 06/04/2021 CLINICAL DATA:  Elevated liver enzymes EXAM: ULTRASOUND ABDOMEN LIMITED RIGHT UPPER QUADRANT COMPARISON:  CT 03/20/2020, 03/19/2016, CT 03/16/2016 FINDINGS: Gallbladder: Dilated gallbladder containing nonshadowing balls of sludge, largest measuring 1.7 x 1.7 cm. There is no wall thickening or pericholecystic fluid. Negative sonographic Murphy sign. Common bile duct: Diameter: 4.3 mm Liver: No focal lesion identified. Within normal limits in parenchymal echogenicity. Portal vein is patent on color Doppler imaging with normal direction of blood flow towards the liver. Other: None. IMPRESSION: Dilated gallbladder containing nonshadowing sludge balls, largest measuring 1.7 cm. No wall thickening or pericholecystic fluid. Negative sonographic Murphy sign. Correlate with labs and physical exam. Electronically Signed   By: Maurine Simmering   On: 06/04/2021 17:28    ECG & Cardiac Imaging    RSR, 81, rightward axis, LAE, nonspecific T changes - personally reviewed.  Assessment & Plan   1.  Hypotension:  Pt admitted 6/28 w/ hypotension noted @ home.  This was first noted following 4 episodes of diarrhea w/ dark stool.  She had recent prolonged hospitalization  for CHF w/ aggressive and successful diuresis.  She was d/c'd on 6/21 @ 66kg on coreg, entresto, spiro, torsemide, and farxiga.  At f/u 6/24, wt was down an additional 7.9kg.  BP was soft that day @ 104/60.  She felt well until developing diarrhea in the early morning hours of 6/28 and then was found to be hypotensive by PT/OT staff, prompting EMS eval and admission.  In ED, AKI and likely mild shock liver was noted w/ creat of 2.44 and AST/ALT 59/48. All numbers have since improved but not normalized at this point.  BPs remain soft in the 90's to low 100's.  She initially received IVF boluses, though only 500 ml is recorded (MAR suggests more was given).  Home meds are on hold and midodrine has been initiated.  She appears euvolemic on exam and is likely dry.  Will add NS @ 43m/hr continuously.  We will cont to watch volume status closely.  Hopefully will not require midodrine long term.  2.  AKI:  See above. Creat improving off of diuretic/ARNI/MRA/SGLT2i.  Hydrate as above.  3.  Abnl LFTs:  likely mild shock liver in setting of hypotension.  LFTs improving.  4.  Chronic combined syst/diast CHF/PAH:  Clinically dry.  Cont to hold CHF meds as outlined above.  Gentle hydration.  Will look to add back ? blocker and ARNI (perhaps only ARB) as BP's allow in the next few days (or outpt setting if she goes home).  Given massive volume overload @ presentation on last admission, it appears her compliance w/ home meds has improved significantly (dtr now giving meds) and she may not require/tolerate as much going forward.  5.  Mod to severe MR/Mild to moderate MS:  Not felt to be a surgical/mitraclip candidate.  Follow.  6.  CAD:  Multivessel CAD on cath in 03/2020 w/ CTO of RCA.  Mild HsTrop elevation in setting of above.  Med rx on hold in setting of above.  7.  Melena:  Reported dark stools on admission.  Diarrhea has resolved. H/H stable.  No stool for guaiac collected.  8.  HL: on rosuvastatin.  LFTs  improving.  Follow.  Signed, Murray Hodgkins, NP 06/07/2021, 1:26 PM  For questions or updates, please contact   Please consult [email or web address] for contact info under Cardiology/STEMI.

## 2021-06-07 NOTE — Progress Notes (Signed)
OT Cancellation Note  Patient Details Name: Anita [name] MRN: [id number] DOB: 1961/11/08   Cancelled Treatment:    Reason Eval/Treat Not Completed: Patient declined, no reason specified. OT continues to follow pt for tx. Upon arrival to pt room, therapist introduces self and pt states, "Oh no, do we have to? I'm too damn tired." Pt declines therapy services despite education. Will hold at this time and re-attempt at a later date/time as available.   Shara Blazing, M.S., OTR/L Ascom: [phone] 06/07/21, 2:08 PM

## 2021-06-07 NOTE — Progress Notes (Signed)
Physical Therapy Treatment Patient Details Name: Anita [name] MRN: [id number] DOB: 10/04/1961 Today's Date: 06/07/2021    History of Present Illness Pt is a 60 y.o. F w/ PMH of migraine, CAD, HF, COPD, GERD, MS presenting to ED for hypotension and acute diarrhea. Recent admission 6/11 to 6/21 for acute on chronic CHF.    PT Comments    Patient alert, agreeable to PT but stated she only was willing to stand up today. BP assessed in sitting and attempted in standing, but pt unable to tolerate standing >15 seconds due to complaints of dizziness. BP readings WFLs, MAP 65 or greater with each reading. Bed mobility performed with supervision, and sit <> Stand with supervision with PT stabilizing RW. Pt returned to supine to rest, time spent educating patient on safety concerns at home, and potential assist. Current recommendation remains appropriate due to acute decline in functional status, pt aware and still wanting to return home.     Follow Up Recommendations  Supervision/Assistance - 24 hour;SNF     Equipment Recommendations  Rolling walker with 5" wheels    Recommendations for Other Services       Precautions / Restrictions Precautions Precautions: Fall    Mobility  Bed Mobility Overal bed mobility: Needs Assistance Bed Mobility: Supine to Sit;Sit to Supine     Supine to sit: Supervision;HOB elevated Sit to supine: Supervision;HOB elevated   General bed mobility comments: use of bed rails    Transfers Overall transfer level: Needs assistance Equipment used: Rolling walker (2 wheeled) Transfers: Sit to/from Stand Sit to Stand: Supervision;Min guard         General transfer comment: cued for hand placement, pt pulls on RW with PT assist to steady walker  Ambulation/Gait                 Stairs             Wheelchair Mobility    Modified Rankin (Stroke Patients Only)       Balance Overall balance assessment: Needs  assistance Sitting-balance support: Feet unsupported;Bilateral upper extremity supported Sitting balance-Leahy Scale: Good Sitting balance - Comments: anxious with sitting   Standing balance support: Bilateral upper extremity supported Standing balance-Leahy Scale: Fair                              Cognition Arousal/Alertness: Awake/alert Behavior During Therapy: Anxious Overall Cognitive Status: Within Functional Limits for tasks assessed                                 General Comments: AOx4, reports feeling anxious re: mobility      Exercises      General Comments General comments (skin integrity, edema, etc.): pt BP assessed, not orthostatic today but did report ongoing dizziness      Pertinent Vitals/Pain Pain Assessment: No/denies pain    Home Living                      Prior Function            PT Goals (current goals can now be found in the care plan section) Progress towards PT goals: Progressing toward goals    Frequency    Min 2X/week      PT Plan Discharge plan needs to be updated    Co-evaluation  AM-PAC PT "6 Clicks" Mobility   Outcome Measure  Help needed turning from your back to your side while in a flat bed without using bedrails?: None Help needed moving from lying on your back to sitting on the side of a flat bed without using bedrails?: None Help needed moving to and from a bed to a chair (including a wheelchair)?: A Lot Help needed standing up from a chair using your arms (e.g., wheelchair or bedside chair)?: A Little Help needed to walk in hospital room?: A Lot Help needed climbing 3-5 steps with a railing? : Total 6 Click Score: 16    End of Session   Activity Tolerance: Patient tolerated treatment well;Other (comment) (self limiting) Patient left: in bed;with call bell/phone within reach;with bed alarm set;with SCD's reapplied Nurse Communication: Mobility status PT Visit  Diagnosis: Muscle weakness (generalized) (M62.81);Other abnormalities of gait and mobility (R26.89);History of falling (Z91.81)     Time: 7215-8727 PT Time Calculation (min) (ACUTE ONLY): 15 min  Charges:  $Therapeutic Activity: 8-22 mins                    Lieutenant Diego PT, DPT 4:19 PM,06/07/21

## 2021-06-08 DIAGNOSIS — E860 Dehydration: Secondary | ICD-10-CM

## 2021-06-08 DIAGNOSIS — I519 Heart disease, unspecified: Secondary | ICD-10-CM

## 2021-06-08 LAB — HEPATITIS PANEL, ACUTE
HCV Ab: NONREACTIVE
Hep A IgM: NONREACTIVE
Hep B C IgM: NONREACTIVE
Hepatitis B Surface Ag: NONREACTIVE

## 2021-06-08 LAB — CBC WITH DIFFERENTIAL/PLATELET
Abs Immature Granulocytes: 0.03 10*3/uL (ref 0.00–0.07)
Basophils Absolute: 0.1 10*3/uL (ref 0.0–0.1)
Basophils Relative: 1 %
Eosinophils Absolute: 0.2 10*3/uL (ref 0.0–0.5)
Eosinophils Relative: 2 %
HCT: 31.2 % — ABNORMAL LOW (ref 36.0–46.0)
Hemoglobin: 10 g/dL — ABNORMAL LOW (ref 12.0–15.0)
Immature Granulocytes: 0 %
Lymphocytes Relative: 9 %
Lymphs Abs: 0.8 10*3/uL (ref 0.7–4.0)
MCH: 28.5 pg (ref 26.0–34.0)
MCHC: 32.1 g/dL (ref 30.0–36.0)
MCV: 88.9 fL (ref 80.0–100.0)
Monocytes Absolute: 1.1 10*3/uL — ABNORMAL HIGH (ref 0.1–1.0)
Monocytes Relative: 12 %
Neutro Abs: 6.7 10*3/uL (ref 1.7–7.7)
Neutrophils Relative %: 76 %
Platelets: 264 10*3/uL (ref 150–400)
RBC: 3.51 MIL/uL — ABNORMAL LOW (ref 3.87–5.11)
RDW: 16.3 % — ABNORMAL HIGH (ref 11.5–15.5)
WBC: 8.9 10*3/uL (ref 4.0–10.5)
nRBC: 0 % (ref 0.0–0.2)

## 2021-06-08 LAB — MAGNESIUM: Magnesium: 1.9 mg/dL (ref 1.7–2.4)

## 2021-06-08 LAB — COMPREHENSIVE METABOLIC PANEL
ALT: 134 U/L — ABNORMAL HIGH (ref 0–44)
AST: 148 U/L — ABNORMAL HIGH (ref 15–41)
Albumin: 3 g/dL — ABNORMAL LOW (ref 3.5–5.0)
Alkaline Phosphatase: 278 U/L — ABNORMAL HIGH (ref 38–126)
Anion gap: 9 (ref 5–15)
BUN: 35 mg/dL — ABNORMAL HIGH (ref 6–20)
CO2: 24 mmol/L (ref 22–32)
Calcium: 8.8 mg/dL — ABNORMAL LOW (ref 8.9–10.3)
Chloride: 102 mmol/L (ref 98–111)
Creatinine, Ser: 1.54 mg/dL — ABNORMAL HIGH (ref 0.44–1.00)
GFR, Estimated: 39 mL/min — ABNORMAL LOW (ref >60)
Glucose, Bld: 81 mg/dL (ref 70–99)
Potassium: 4 mmol/L (ref 3.5–5.1)
Sodium: 135 mmol/L (ref 135–145)
Total Bilirubin: 0.8 mg/dL (ref 0.3–1.2)
Total Protein: 6.7 g/dL (ref 6.5–8.1)

## 2021-06-08 NOTE — TOC Progression Note (Signed)
Transition of Care [facility]) - Progression Note    Patient Details  Name: Anita [name] MRN: [id number] Date of Birth: 05/27/1961  Transition of [facility]) CM/SW Contact  Izola Price, RN Phone Number: 06/08/2021, 12:00 PM  Clinical Narrative: 7/2: Change in mental status/orientation per RN. Refusing to get OOB, trying to discontinue purewick. EDD >2 days. Simmie Davies RN CM           Expected Discharge Plan and Services                                                 Social Determinants of Health (SDOH) Interventions    Readmission Risk Interventions Readmission Risk Prevention Plan 06/06/2021  Transportation Screening Complete  PCP or Specialist Appt within 3-5 Days Complete  HRI or [place] Complete  Social Work Consult for [place] Planning/Counseling Not Complete  SW consult not completed comments RNCM assigned to case  Palliative Care Screening Not Applicable  Medication Review [employer]) Complete  Some recent data might be hidden

## 2021-06-08 NOTE — Progress Notes (Signed)
PROGRESS NOTE    Anita [name]  MEQ:[id number] DOB: [birth year] DOA: 06/04/2021 PCP: [name], NP    Chief Complaint  Patient presents with   Hypotension   Rectal Bleeding    Brief Narrative:  Anita [name] is Anita [name] 60 y.o. Caucasian female with medical history significant for GERD, migraine, coronary artery disease, heart failure with reduced ejection fraction, COPD, GERD and multiple sclerosis, who presented to the emergency room with acute onset of diarrhea with associated black tarry stools per her report without bright red bleeding per rectum.  Also reported hypotension. The patient was recently admitted here on 6/11 till 6/21 for acute on chronic combined systolic and diastolic CHF.    Assessment & Plan:   Active Problems:   AKI (acute kidney injury) ([place])   Elevated liver enzymes   Dehydration  # Orthostatic Hypotension Seems improved, though still with relative hypotension Thigh high compression stockings (apparently is refusing these) Hold antihypertensives Add midodrine and follow  Appreciate cardiology assistance - getting gentle hydration  # Severe Mitral Regurgitation  Heart Failure Given significant orthostasis, will c/s cardiology 6/30, appreciate assistance Not felt to be surgical/mitraclip candidate per cardiology HF meds on hold with hypotension above - per cards Gentle IVF as noted above  1.  Acute kidney injury superimposed on stage IIIa chronic kidney disease - 2/2 diarrhea +/- dehydration with diuresis.  ? GI bleed as well with concern for melena. 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  Hypokalemia. - improved, follow   3.  Mild hyponatremia. - improved with IVF  4.  Elevated LFTs. - This could still be related to dehydration/shock liver with relative hypotension -- worsening today - acute hepatitis panel - crestor on hold - will continue to monitor - she's been getting tylenol relatively regularly as well, will hold -- trend, follow  -- RUQ [place] with dilated gallbladder, nonshadowing sludge balls --- if worsening, consider repeat  5.  Pyuria with rare bacteriuria and no urinary symptoms. - cx with multiple species, follow  6.  Heart failure with reduced ejection fraction, compensated. - Hold farxiga.  Continue to hold off Entresto and Aldactone given acute kidney injury and hypotension.  Hold coreg.  7.  Dyslipidemia. - We will continue statin therapy.  Elevated LFTs are very mild to hold off.  8.  Coronary artery disease. - We will continue as needed sublingual nitroglycerin, statin therapy .  Hold coreg.  9.  COPD. - We will continue her Advair Diskus.  10.  Multiple sclerosis. - at baseline has difficulty with strength, balance, bowel/bladder incontinence.  Notes she walks with walker.  DVT prophylaxis: SCD Code Status: full  Family Communication: brother at bedside Disposition:   Status is: Inpatient  Remains inpatient appropriate because:Inpatient level of care appropriate due to severity of illness  Dispo: The patient is from: Home  Anticipated d/c is to: Home              Patient currently is not medically stable to d/c.   Difficult to place patient No       Consultants:  none  Procedures:  none  Antimicrobials: Anti-infectives (From admission, onward)    None          Subjective: C/o  chronic back pain, though better than usual  Objective: Vitals:   06/08/21 0546 06/08/21 0716 06/08/21 1113 06/08/21 1643  BP:  98/61 111/65 106/71  Pulse:  93 (!) 108 90  Resp:  16 20 16   Temp:  (!) 97.4 F (36.3 C)  99 F (37.2 C)  TempSrc:    Oral  SpO2:  100%  99%  Weight: 50 kg     Height:        Intake/Output Summary (Last 24 hours) at 06/08/2021 1827 Last data filed at 06/08/2021 1645 Gross per 24 hour  Intake 1309.4 ml  Output 1250 ml  Net 59.4 ml   Filed Weights   06/04/21 1212 06/07/21 0500 06/08/21 0546  Weight: 63.5 kg 64 kg 50 kg    Examination:  General: No acute distress. Cardiovascular: Heart sounds show Kameisha Malicki regular rate, and rhythm Lungs: Clear to auscultation bilaterally. Abdomen: Soft, nontender, nondistended  Neurological: Alert and oriented 3. Moves all extremities 4 . Cranial nerves II through XII grossly intact. Skin: Warm and dry. No rashes or lesions. Extremities: No clubbing or cyanosis. No edema.   Data Reviewed: I have personally reviewed following labs and imaging studies  CBC: Recent Labs  Lab 06/04/21 1208 06/05/21 0825 06/05/21 1409 06/05/21 2029 06/06/21 0417 06/07/21 0335 06/08/21 0516  WBC 6.6 5.7  --   --  5.9 7.7 8.9  NEUTROABS  --   --   --   --  3.7 5.6 6.7  HGB 12.0 11.5* 10.5* 10.6* 10.6* 10.0* 10.0*  HCT 38.0 36.5 33.3* 32.8* 32.9* 30.3* 31.2*  MCV 89.8 89.0  --   --  87.7 84.9 88.9  PLT 257 220  --   --  247 250 209    Basic Metabolic Panel: Recent Labs  Lab 06/04/21 2230 06/05/21 0825 06/06/21 0417 06/07/21 0335 06/08/21 0516  NA 134* 136 133* 133* 135  K 3.7 4.4 4.0 4.0 4.0  CL 96* 100 102 101 102  CO2 25 24 26 25 24   GLUCOSE 91 87 84 95 81  BUN 52* 48* 41* 37* 35*  CREATININE 2.44* 1.95* 1.71* 1.58* 1.54*  CALCIUM 8.9 9.0 9.0 8.7* 8.8*  MG  --   --  1.8 1.7 1.9  PHOS  --   --  3.5 3.4  --     GFR: Estimated Creatinine Clearance: 31.4 mL/min (Mohammedali Bedoy) (by C-G formula based on SCr of 1.54 mg/dL  (H)).  Liver Function Tests: Recent Labs  Lab 06/04/21 1208 06/06/21 0417 06/07/21 0335 06/08/21 0516  AST 59* 57* 42* 148*  ALT 48* 48* 39 134*  ALKPHOS 173* 149* 142* 278*  BILITOT 0.8 0.7 0.7 0.8  PROT 7.8 6.7 6.7 6.7  ALBUMIN 3.5 3.1* 3.1* 3.0*    CBG: No results for input(s): GLUCAP in the last 168 hours.   Recent Results (from the past 240 hour(s))  Urine Culture     Status: Abnormal   Collection Time: 06/04/21  2:56 PM   Specimen: Urine, Random  Result Value Ref Range Status   Specimen Description   Final    URINE,  RANDOM Performed at [facility], [address] [place], [place] [address]    Special Requests   Final    NONE Performed at [facility], [address] [place],  [address]    Culture MULTIPLE SPECIES PRESENT, SUGGEST RECOLLECTION (Dayrin Stallone)  Final   Report Status 06/06/2021 FINAL  Final  Resp Panel by RT-PCR (Flu Malik Paar&B, Covid) Nasopharyngeal Swab     Status: None   Collection Time: 06/04/21  8:23 PM   Specimen: Nasopharyngeal Swab; Nasopharyngeal(NP) swabs in vial transport medium  Result Value Ref Range Status   SARS Coronavirus 2 by RT PCR NEGATIVE NEGATIVE Final    Comment: (NOTE) SARS-CoV-2 target nucleic acids are NOT DETECTED.  The SARS-CoV-2 RNA is generally detectable in upper respiratory specimens during the acute phase of infection. The lowest concentration of SARS-CoV-2 viral copies this assay can detect is 138 copies/mL. Doye Montilla negative result does not preclude SARS-Cov-2 infection and should not be used as the sole basis for treatment or other patient management decisions. Reyansh Kushnir negative result may occur with  improper specimen collection/handling, submission of specimen other than nasopharyngeal swab, presence of viral mutation(s) within the areas targeted by this assay, and inadequate number of viral copies(<138 copies/mL). Kentarius Partington negative result must be combined with clinical observations, patient history, and  epidemiological information. The expected result is Negative.  Fact Sheet for Patients:  EntrepreneurPulse.com.au  Fact Sheet for Healthcare Providers:  IncredibleEmployment.be  This test is no t yet approved or cleared by the [place] FDA and  has been authorized for detection and/or diagnosis of SARS-CoV-2 by FDA under an Emergency Use Authorization (EUA). This EUA will remain  in effect (meaning this test can be used) for the duration of the COVID-19 declaration under Section 564(b)(1) of the Act, 21 U.S.C.section 360bbb-3(b)(1), unless the authorization is terminated  or revoked sooner.       Influenza Shaqueta Casady by PCR NEGATIVE NEGATIVE Final   Influenza B by PCR NEGATIVE NEGATIVE Final    Comment: (NOTE) The Xpert Xpress SARS-CoV-2/FLU/RSV plus assay is intended as an aid in the diagnosis of influenza from Nasopharyngeal swab specimens and should not be used as Hatsuko Bizzarro sole basis for treatment. Nasal washings and aspirates are unacceptable for Xpert Xpress SARS-CoV-2/FLU/RSV testing.  Fact Sheet for Patients: EntrepreneurPulse.com.au  Fact Sheet for Healthcare Providers: IncredibleEmployment.be  This test is not yet approved or cleared by the [place] FDA and has been authorized for detection and/or diagnosis of SARS-CoV-2 by FDA under an Emergency Use Authorization (EUA). This EUA will remain in effect (meaning this test can be used) for the duration of the COVID-19 declaration under Section 564(b)(1) of the Act, 21 U.S.C. section 360bbb-3(b)(1), unless the authorization is terminated or revoked.  Performed at [facility], [address], [place],  [address]          Radiology Studies: No results found.      Scheduled Meds:  acidophilus  1 capsule Oral Daily   ferrous sulfate  325 mg Oral BID WC   loratadine  10 mg Oral Daily   midodrine  5 mg Oral TID WC    mirtazapine  15 mg Oral QHS   mometasone-formoterol  2 puff Inhalation BID   multivitamin with minerals  1 tablet Oral Daily   pantoprazole  40 mg Oral Daily   polyethylene glycol  17 g Oral BID   vitamin B-12  1,000 mcg Oral Daily   Continuous Infusions:  sodium chloride 50 mL/hr at 06/08/21  1232      LOS: 4 days    Time spent: over 30 min    Fayrene Helper, MD [facility]   To contact the attending provider between 7A-7P or the covering provider during after hours 7P-7A, please log into the web site [email or web address] and access using universal [place] password for that web site. If you do not have the password, please call the hospital operator.  06/08/2021, 6:27 PM

## 2021-06-08 NOTE — Progress Notes (Addendum)
Progress Note  Patient Name: Anita [name] Date of Encounter: 06/08/2021  Primary Cardiologist: Buford Dresser, MD  Subjective   Feels well this AM.  Says that she's had dizziness w/ standing for over a year.  Orthostatic VS this AM showed good BP but HR did rise w/ standing (93  108).  Inpatient Medications    Scheduled Meds:  acidophilus  1 capsule Oral Daily   ferrous sulfate  325 mg Oral BID WC   loratadine  10 mg Oral Daily   midodrine  5 mg Oral TID WC   mirtazapine  15 mg Oral QHS   mometasone-formoterol  2 puff Inhalation BID   multivitamin with minerals  1 tablet Oral Daily   pantoprazole  40 mg Oral Daily   polyethylene glycol  17 g Oral BID   rosuvastatin  20 mg Oral Daily   vitamin B-12  1,000 mcg Oral Daily   Continuous Infusions:  sodium chloride 50 mL/hr at 06/08/21 0254   PRN Meds: acetaminophen **OR** acetaminophen, albuterol, calcium carbonate, diclofenac Sodium, doxylamine (Sleep), guaiFENesin, morphine injection, nitroGLYCERIN, ondansetron **OR** ondansetron (ZOFRAN) IV, oxyCODONE, SUMAtriptan, traZODone   Vital Signs    Vitals:   06/08/21 0429 06/08/21 0430 06/08/21 0546 06/08/21 0716  BP: 110/66 104/68  98/61  Pulse: 82 82  93  Resp: 18   16  Temp: 98.2 F (36.8 C)   (!) 97.4 F (36.3 C)  TempSrc:      SpO2: 100% 100%  100%  Weight:   50 kg   Height:      Orthostatic VS for the past 24 hrs:  BP- Lying Pulse- Lying BP- Sitting Pulse- Sitting  06/08/21 0430 -- -- 104/68 82  06/08/21 0429 110/66 79 -- --     Intake/Output Summary (Last 24 hours) at 06/08/2021 0737 Last data filed at 06/08/2021 0400 Gross per 24 hour  Intake 648.32 ml  Output 500 ml  Net 148.32 ml   Filed Weights   06/04/21 1212 06/07/21 0500 06/08/21 0546  Weight: 63.5 kg 64 kg 50 kg    Physical Exam   GEN: somewhat frail, in no acute distress.  HEENT: Grossly normal.  Neck: Supple, no JVD, carotid bruits, or masses. Cardiac: RRR, 3/6 syst murmur  heard throughout, loudest @ the apex  L axialla.  No rubs, or gallops. No clubbing, cyanosis, edema.  Radials 2+, DP/PT 2+ and equal bilaterally.  Respiratory:  Respirations regular and unlabored, clear to auscultation bilaterally. GI: Soft, nontender, nondistended, BS + x 4. MS: no deformity or atrophy. Skin: warm and dry, no rash. Neuro:  Strength and sensation are intact. Psych: AAOx3.  Normal affect.  Labs    Chemistry Recent Labs  Lab 06/06/21 0417 06/07/21 0335 06/08/21 0516  NA 133* 133* 135  K 4.0 4.0 4.0  CL 102 101 102  CO2 26 25 24   GLUCOSE 84 95 81  BUN 41* 37* 35*  CREATININE 1.71* 1.58* 1.54*  CALCIUM 9.0 8.7* 8.8*  PROT 6.7 6.7 6.7  ALBUMIN 3.1* 3.1* 3.0*  AST 57* 42* 148*  ALT 48* 39 134*  ALKPHOS 149* 142* 278*  BILITOT 0.7 0.7 0.8  GFRNONAA 34* 38* 39*  ANIONGAP 5 7 9      Hematology Recent Labs  Lab 06/06/21 0417 06/07/21 0335 06/08/21 0516  WBC 5.9 7.7 8.9  RBC 3.75* 3.57* 3.51*  HGB 10.6* 10.0* 10.0*  HCT 32.9* 30.3* 31.2*  MCV 87.7 84.9 88.9  MCH 28.3 28.0 28.5  MCHC 32.2  33.0 32.1  RDW 16.3* 16.1* 16.3*  PLT 247 250 264    Cardiac Enzymes  Recent Labs  Lab 05/18/21 2205 05/18/21 2356  TROPONINIHS 20* 18*      BNP Recent Labs  Lab 06/04/21 1220  BNP 930.0*    Lipids  Lab Results  Component Value Date   CHOL 104 01/08/2021   HDL 40.20 01/08/2021   LDLCALC 51 01/08/2021   TRIG 66.0 01/08/2021   CHOLHDL 3 01/08/2021   Radiology    CT ABDOMEN PELVIS WO CONTRAST  Result Date: 06/04/2021 CLINICAL DATA:  Abdominal pain, acute, nonlocalized EXAM: CT ABDOMEN AND PELVIS WITHOUT CONTRAST TECHNIQUE: Multidetector CT imaging of the abdomen and pelvis was performed following the standard protocol without IV contrast. COMPARISON:  CT abdomen 03/15/2006, CT chest 03/20/2020 FINDINGS: Lower chest: Scarring/linear atelectasis the left lung base. Reticular scarring in the peripheral left lower lobe. Partially visualized bilateral skin  thickening in the breasts bilaterally. Hepatobiliary: No focal liver abnormality is seen. Mildly distended gallbladder, without adjacent inflammatory change. Pancreas: Unremarkable. No pancreatic ductal dilatation or surrounding inflammatory changes. Spleen: Normal in size without focal abnormality. Adrenals/Urinary Tract: Again seen is a 5.6 x 4.3 x 3.1 cm left upper retroperitoneal mass (coronal image 42, axial image 28), adjacent to retroperitoneal surgical clips. The right adrenal gland is unremarkable. No hydronephrosis. Postsurgical changes to the upper pole the left kidney. The bladder is mildly distended but unremarkable. Stomach/Bowel: Distended stomach full of ingested contents. There is no evidence of bowel obstruction. The appendix is normal. Scattered colonic diverticuli. No evidence of diverticulitis. Vascular/Lymphatic: Aortic atherosclerotic calcifications. No AAA. Enlarged left periaortic lymph node measuring 1.2 cm (axial image 39). Reproductive: Unremarkable. Other: Trace perihepatic, perisplenic, and pelvic ascites. Small ventral hernia containing a single wall of the transverse colon. Musculoskeletal: Mild body wall edema. No acute osseous abnormality. Multilevel degenerative disc disease, worst at L4-L5 and T10-T11. IMPRESSION: Left upper retroperitoneal mass measuring 5.6 cm, adjacent to retroperitoneal surgical clips, similar in appearance to prior chest CT in April 2021. 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 Prominent left para-aortic lymph node. Correlate with surgical history and any recent surveillance imaging if performed at another institution. Small volume of abdominopelvic ascites. No other acute abnormality in the abdomen or pelvis. Partially visualized skin thickening of the breasts bilaterally, correlate with mammography. Electronically Signed   By: Maurine Simmering   On: 06/04/2021 21:20   DG Abdomen Acute W/Chest  Result Date:  06/04/2021 CLINICAL DATA:  Rectal bleeding. EXAM: DG ABDOMEN ACUTE WITH 1 VIEW CHEST COMPARISON:  Chest 05/18/2021 FINDINGS: Frontal chest x-ray shows lordotic positioning. Cardiopericardial silhouette is at upper limits of normal for size. The lungs are clear without focal pneumonia, edema, pneumothorax or pleural effusion. The visualized bony structures of the thorax show no acute abnormality. Telemetry leads overlie the chest. Upright film shows no evidence for intraperitoneal free air. Mild gaseous distention of colon in the right pelvis, likely ascending segment. There is no evidence for gaseous bowel dilation to suggest obstruction. Lumbar spine and bony pelvis appear intact. IMPRESSION: No active cardiopulmonary disease. No evidence for bowel perforation or overt obstruction. Electronically Signed   By: Misty Stanley M.D.   On: 06/04/2021 14:50   US Abdomen Limited RUQ (LIVER/GB)  Result Date: 06/04/2021 CLINICAL DATA:  Elevated liver enzymes EXAM: ULTRASOUND ABDOMEN LIMITED RIGHT UPPER QUADRANT COMPARISON:  CT 03/20/2020, 03/19/2016, CT 03/16/2016 FINDINGS: Gallbladder: Dilated gallbladder containing nonshadowing balls of sludge, largest measuring 1.7 x  1.7 cm. There is no wall thickening or pericholecystic fluid. Negative sonographic Murphy sign. Common bile duct: Diameter: 4.3 mm Liver: No focal lesion identified. Within normal limits in parenchymal echogenicity. Portal vein is patent on color Doppler imaging with normal direction of blood flow towards the liver. Other: None. IMPRESSION: Dilated gallbladder containing nonshadowing sludge balls, largest measuring 1.7 cm. No wall thickening or pericholecystic fluid. Negative sonographic Murphy sign. Correlate with labs and physical exam. Electronically Signed   By: Maurine Simmering   On: 06/04/2021 17:28    Telemetry    Not on tele  Cardiac Studies   Cardiac Catheterization  4.16.2021  Medically managed      2D Echocardiogram 3.3.2022    1. Left  ventricular ejection fraction, by estimation, is 40 to 45%. Left  ventricular ejection fraction by 3D volume is 47 %. The left ventricle has  mildly decreased function. The left ventricle demonstrates global  hypokinesis. Left ventricular diastolic   parameters are consistent with Grade II diastolic dysfunction  (pseudonormalization). Elevated left ventricular end-diastolic pressure.   2. Right ventricular systolic function is severely reduced. The right  ventricular size is mildly enlarged. There is severely elevated pulmonary  artery systolic pressure. The estimated right ventricular systolic  pressure is 02.5 mmHg.   3. Left atrial size was moderately dilated.   4. Right atrial size was severely dilated.   5. The mitral valve is degenerative with mild to moderate thickening of  the mitral valve leaflets. There is teathering of the MV leaflets due to  mild LV dysfunction. Severe mitral valve regurgitation. Mild to moderate  mitral stenosis. The mean mitral  valve gradient is 6.5 mmHg.   6. Tricuspid valve regurgitation is moderate to severe.   7. The aortic valve is tricuspid. Aortic valve regurgitation is mild.  Mild aortic valve sclerosis is present, with no evidence of aortic valve  stenosis. Aortic regurgitation PHT measures 499 msec.   8. The inferior vena cava is dilated in size with <50% respiratory  variability, suggesting right atrial pressure of 15 mmHg.   9. There is a trivial pericardial effusion posterior to the left  ventricle.  10. Compared to prior echo, Mitral regurgitation appears worse and RV  dysfunction is now present. There is severe Pulmonary HTN.  Patient Profile     60 year old female with a history of medically managed multivessel CAD, chronic combined systolic and diastolic congestive heart failure, ischemic cardiomyopathy, moderate to severe mitral regurgitation with rheumatic appearing mitral valve (without severe mitral stenosis) pulmonary hypertension,  multiple sclerosis, COPD, anemia, prior tobacco abuse, and chronic dyspnea, who was admitted June 11 with volume overload/anasarca and successfully diuresed and then readmitted 6/28 w/ volume depletion, diarrhea/melena, and hypotension.  Assessment & Plan    1.  Hypotension: Pt admitted 6/28 w/ hypotension noted @ home.  This was first noted following 4 episodes of diarrhea w/ dark stool.  She had recent prolonged hospitalization for CHF w/ aggressive and successful diuresis.  She was d/c'd on 6/21 @ 66kg on coreg, entresto, spiro, torsemide, and farxiga.  At f/u 6/24, wt was down an additional 7.9kg.  BP was soft that day @ 104/60.  She felt well until developing diarrhea in the early morning hours of 6/28 and then was found to be hypotensive by PT/OT staff, prompting EMS eval and admission.  In ED, AKI and likely mild shock liver was noted w/ creat of 2.44 and AST/ALT 59/48.  BP remains soft but stable off of home meds.  IVF started 7/1 @ 50/hr.  Remains on midodrine.  She feels well and notes a long h/o orthostatic dizziness/lightheadedness.  Orthostatic VS this AM notable for rise in HR to 108 w/ appropriate rise in BP.  Rec continuing to hold home meds, ongoing gentle hydration, and eventual weaning/discontinuation of midodrine.  2.  AKI: BUN/Creat cont to slowly improve.  Torsemide/entresto/spiro/farxiga on hold.  3.  Abnl LFTs: ? Shock liver in setting of hypotension on admission.  BPs soft but stable and LFTs higher this AM (AST/ALT 148/134).  Will hold statin.  4.  Diarrhea/Anemia/Melena:  Diarrhea and dark stool reported on 6/28.  4 episodes that day w/ no recurrence.  H/H stable.  W/u per IM.  5.  Chronic combined syst/diast CHF/PAH: +179ml yesterday in the setting of intentional fluid resuscitation. Wts are likely inaccurate as she was listed @ 64 kg 6/30 and then 50kg last night (7/1).  Euvolemic on exam.  Follow closely on IVF as she was recently admitted w/ massive volume overload and  anasarca.  Prev d/c wt (bedscale) was 66kg.  I suspect that now that her dtr is managing her meds and thus compliance has improved significantly, she may not tolerate/need as much diuretic as previously thought.  Once off midodrine, we will look to resume some dose of ? blocker/arni.  6.  Mod - Severe MR/Mild to mod MS:  Not felt to be a surgical/mitraclip candidate (MS).  7.  CAD:  multivessel CAD on cath in 03/2020 w/ CTO of RCA.  Mild HsTrop elevation in setting of above.  Med rx 2/2 AKI/hypotension.  No plan for ischemic eval @ this point.    8.  HL:  LFTs up this AM - AST/ALT 148/134.  Hold statin for now.  Signed, Murray Hodgkins, NP  06/08/2021, 7:37 AM    For questions or updates, please contact   Please consult [email or web address] for contact info under Cardiology/STEMI.     I have seen, examined the patient, and reviewed the above assessment and plan.  Changes to above are made where necessary.  On exam, chronically ill and frail, RRR.  She presents with hypotension.  She is making some improvement with hydration.  Continue current management.  Co Sign: Thompson Grayer, MD 06/08/2021 3:37 PM

## 2021-06-09 ENCOUNTER — Encounter: Payer: Self-pay | Admitting: Cardiology

## 2021-06-09 DIAGNOSIS — R748 Abnormal levels of other serum enzymes: Secondary | ICD-10-CM

## 2021-06-09 DIAGNOSIS — N179 Acute kidney failure, unspecified: Principal | ICD-10-CM

## 2021-06-09 LAB — CBC WITH DIFFERENTIAL/PLATELET
Abs Immature Granulocytes: 0.03 10*3/uL (ref 0.00–0.07)
Basophils Absolute: 0.1 10*3/uL (ref 0.0–0.1)
Basophils Relative: 1 %
Eosinophils Absolute: 0.3 10*3/uL (ref 0.0–0.5)
Eosinophils Relative: 3 %
HCT: 31.6 % — ABNORMAL LOW (ref 36.0–46.0)
Hemoglobin: 9.7 g/dL — ABNORMAL LOW (ref 12.0–15.0)
Immature Granulocytes: 0 %
Lymphocytes Relative: 12 %
Lymphs Abs: 1 10*3/uL (ref 0.7–4.0)
MCH: 27.6 pg (ref 26.0–34.0)
MCHC: 30.7 g/dL (ref 30.0–36.0)
MCV: 89.8 fL (ref 80.0–100.0)
Monocytes Absolute: 1.1 10*3/uL — ABNORMAL HIGH (ref 0.1–1.0)
Monocytes Relative: 13 %
Neutro Abs: 5.9 10*3/uL (ref 1.7–7.7)
Neutrophils Relative %: 71 %
Platelets: 327 10*3/uL (ref 150–400)
RBC: 3.52 MIL/uL — ABNORMAL LOW (ref 3.87–5.11)
RDW: 16.4 % — ABNORMAL HIGH (ref 11.5–15.5)
WBC: 8.3 10*3/uL (ref 4.0–10.5)
nRBC: 0 % (ref 0.0–0.2)

## 2021-06-09 LAB — COMPREHENSIVE METABOLIC PANEL
ALT: 119 U/L — ABNORMAL HIGH (ref 0–44)
AST: 127 U/L — ABNORMAL HIGH (ref 15–41)
Albumin: 2.9 g/dL — ABNORMAL LOW (ref 3.5–5.0)
Alkaline Phosphatase: 261 U/L — ABNORMAL HIGH (ref 38–126)
Anion gap: 7 (ref 5–15)
BUN: 28 mg/dL — ABNORMAL HIGH (ref 6–20)
CO2: 24 mmol/L (ref 22–32)
Calcium: 8.7 mg/dL — ABNORMAL LOW (ref 8.9–10.3)
Chloride: 104 mmol/L (ref 98–111)
Creatinine, Ser: 1.22 mg/dL — ABNORMAL HIGH (ref 0.44–1.00)
GFR, Estimated: 51 mL/min — ABNORMAL LOW (ref >60)
Glucose, Bld: 93 mg/dL (ref 70–99)
Potassium: 3.8 mmol/L (ref 3.5–5.1)
Sodium: 135 mmol/L (ref 135–145)
Total Bilirubin: 0.5 mg/dL (ref 0.3–1.2)
Total Protein: 6.6 g/dL (ref 6.5–8.1)

## 2021-06-09 LAB — MAGNESIUM: Magnesium: 1.8 mg/dL (ref 1.7–2.4)

## 2021-06-09 LAB — PHOSPHORUS: Phosphorus: 3.1 mg/dL (ref 2.5–4.6)

## 2021-06-09 NOTE — Progress Notes (Addendum)
Progress Note  Patient Name: Anita [name] Date of Encounter: 06/09/2021  Primary Cardiologist: Buford Dresser, MD  Subjective   Feels well this AM.  No chest pain, dyspnea, lightheadedness.  BPs have been stable and trending a little higher - 1-teens.  Inpatient Medications    Scheduled Meds:  acidophilus  1 capsule Oral Daily   ferrous sulfate  325 mg Oral BID WC   loratadine  10 mg Oral Daily   midodrine  5 mg Oral TID WC   mirtazapine  15 mg Oral QHS   mometasone-formoterol  2 puff Inhalation BID   multivitamin with minerals  1 tablet Oral Daily   pantoprazole  40 mg Oral Daily   polyethylene glycol  17 g Oral BID   vitamin B-12  1,000 mcg Oral Daily   Continuous Infusions:  sodium chloride 50 mL/hr at 06/09/21 0614   PRN Meds: albuterol, calcium carbonate, diclofenac Sodium, doxylamine (Sleep), guaiFENesin, morphine injection, nitroGLYCERIN, ondansetron **OR** ondansetron (ZOFRAN) IV, oxyCODONE, SUMAtriptan, traZODone   Vital Signs    Vitals:   06/09/21 0726 06/09/21 0728 06/09/21 0729 06/09/21 0731  BP: 116/82 114/70 116/79 113/72  Pulse: 99 (!) 103 (!) 107 (!) 104  Resp: 18     Temp: 99.3 F (37.4 C)     TempSrc:      SpO2: 100% 99% 97% (!) 87%  Weight:      Height:        Intake/Output Summary (Last 24 hours) at 06/09/2021 0801 Last data filed at 06/09/2021 5621 Gross per 24 hour  Intake 2300.47 ml  Output 1650 ml  Net 650.47 ml   Filed Weights   06/07/21 0500 06/08/21 0546 06/09/21 0604  Weight: 64 kg 50 kg 51 kg    Physical Exam   GEN: Thin, frail, in no acute distress.  HEENT: Grossly normal.  Neck: Supple, no JVD, carotid bruits, or masses. Cardiac: RRR, mildly tachy, 3/6 syst murmur heard throughout - loudest @ apex  Axilla.  No rubs, or gallops. No clubbing, cyanosis, edema.  Radials 2+, DP/PT 2+ and equal bilaterally.  Respiratory:  Respirations regular and unlabored, clear to auscultation bilaterally. GI: Soft, nontender,  nondistended, BS + x 4. MS: no deformity or atrophy. Skin: warm and dry, no rash. Neuro:  Strength and sensation are intact. Psych: AAOx3.  Normal affect.  Labs    Chemistry Recent Labs  Lab 06/07/21 0335 06/08/21 0516 06/09/21 0410  NA 133* 135 135  K 4.0 4.0 3.8  CL 101 102 104  CO2 25 24 24   GLUCOSE 95 81 93  BUN 37* 35* 28*  CREATININE 1.58* 1.54* 1.22*  CALCIUM 8.7* 8.8* 8.7*  PROT 6.7 6.7 6.6  ALBUMIN 3.1* 3.0* 2.9*  AST 42* 148* 127*  ALT 39 134* 119*  ALKPHOS 142* 278* 261*  BILITOT 0.7 0.8 0.5  GFRNONAA 38* 39* 51*  ANIONGAP 7 9 7      Hematology Recent Labs  Lab 06/07/21 0335 06/08/21 0516 06/09/21 0410  WBC 7.7 8.9 8.3  RBC 3.57* 3.51* 3.52*  HGB 10.0* 10.0* 9.7*  HCT 30.3* 31.2* 31.6*  MCV 84.9 88.9 89.8  MCH 28.0 28.5 27.6  MCHC 33.0 32.1 30.7  RDW 16.1* 16.3* 16.4*  PLT 250 264 327    Cardiac Enzymes  Recent Labs  Lab 05/18/21 2205 05/18/21 2356  TROPONINIHS 20* 18*      BNP Recent Labs  Lab 06/04/21 1220  BNP 930.0*     Lipids  Lab Results  Component Value Date   CHOL 104 01/08/2021   HDL 40.20 01/08/2021   LDLCALC 51 01/08/2021   TRIG 66.0 01/08/2021   CHOLHDL 3 01/08/2021    Radiology    No results found.  Telemetry    Not on tele  Cardiac Studies   Cardiac Catheterization  4.16.2021  Medically managed      2D Echocardiogram 3.3.2022    1. Left ventricular ejection fraction, by estimation, is 40 to 45%. Left  ventricular ejection fraction by 3D volume is 47 %. The left ventricle has  mildly decreased function. The left ventricle demonstrates global  hypokinesis. Left ventricular diastolic   parameters are consistent with Grade II diastolic dysfunction  (pseudonormalization). Elevated left ventricular end-diastolic pressure.   2. Right ventricular systolic function is severely reduced. The right  ventricular size is mildly enlarged. There is severely elevated pulmonary  artery systolic pressure. The  estimated right ventricular systolic  pressure is 60.7 mmHg.   3. Left atrial size was moderately dilated.   4. Right atrial size was severely dilated.   5. The mitral valve is degenerative with mild to moderate thickening of  the mitral valve leaflets. There is teathering of the MV leaflets due to  mild LV dysfunction. Severe mitral valve regurgitation. Mild to moderate  mitral stenosis. The mean mitral  valve gradient is 6.5 mmHg.   6. Tricuspid valve regurgitation is moderate to severe.   7. The aortic valve is tricuspid. Aortic valve regurgitation is mild.  Mild aortic valve sclerosis is present, with no evidence of aortic valve  stenosis. Aortic regurgitation PHT measures 499 msec.   8. The inferior vena cava is dilated in size with <50% respiratory  variability, suggesting right atrial pressure of 15 mmHg.   9. There is a trivial pericardial effusion posterior to the left  ventricle.  10. Compared to prior echo, Mitral regurgitation appears worse and RV  dysfunction is now present. There is severe Pulmonary HTN.  Patient Profile     60 year old female with a history of medically managed multivessel CAD, chronic combined systolic and diastolic congestive heart failure, ischemic cardiomyopathy, moderate to severe mitral regurgitation with rheumatic appearing mitral valve (without severe mitral stenosis) pulmonary hypertension, multiple sclerosis, COPD, anemia, prior tobacco abuse, and chronic dyspnea, who was admitted June 11 with volume overload/anasarca and successfully diuresed and then readmitted 6/28 w/ volume depletion, diarrhea/melena, and hypotension.  Assessment & Plan    1.  Hypotension:  Pt admitted 6/28 w/ hypotension noted @ home.  This was first noted following 4 episodes of diarrhea w/ dark stool.  She had recent prolonged hospitalization for CHF w/ aggressive and successful diuresis.  She was d/c'd on 6/21 @ 66kg on coreg, entresto, spiro, torsemide, and farxiga.  At f/u  6/24, wt was down an additional 7.9kg.  BP was soft that day @ 104/60.  She felt well until developing diarrhea in the early morning hours of 6/28 and then was found to be hypotensive by PT/OT staff, prompting EMS eval and admission.  In ED, AKI and likely mild shock liver was noted w/ creat of 2.44 and AST/ALT 59/48.  Home CHF meds have been on hold and she has been receiving 0.9NS @ 46ml/hr.  +650 yesterday.   Still mildly orthostatic by HR, though BP has been stable in 1-teens. She's asymptomatic this AM.  Renal fxn back to baseline from prior discharge @ 1.22 this AM.  Cont IVF today.  Consider weaning midodrine.  2.  AKI:  BUN/Creat stable @ 28/1.22 - similar to prior discharge creat.  3.  Abnl LFTs: ? Shock liver in setting of hypotension on admission.  Sl lower today.  Statin/acetaminophen on hold.  Hepatitis w/u per IM.  BPs stable over past several days.  4.  Diarrhea/Anemia/Melena:  diarrhea & dark stool reported on 6/28.  4 episodes that day w/o recurrence.  H/H drifting down slightly in setting of IVF/blood draws.  Per IM - outpt GI eval.  5.  Chronic combined syst/diast CHF/PAH:  +650 yesterday.  Standing wt obtained just now and wt down to 47 kg, from 58 kg (also standing scale) 6/24.  With stabilization of renal fxn, it doesn't seem likely that dramatic wt loss, just since last discharge, is all related to dehydration.  She's euvolemic on exam and as noted above, BPs improving, though I'm reluctant to begin adding back even low doses of prior meds, which included carvedilol 3.125 bid, farxiga 10 qd, entresto 24-36 bid, spiro 25 qd, and torsemide 40 qd, esp as she continues to require midodrine.  Will cont to watch volume closely while hospitalized and may need to defer resumption of CHF meds to outpt setting.  6.  [place] - Severe MR/Mild to mod MS:  not felt to be a surgical or mitraclip candidate.  7.  CAD:  multivessel CAD on cath in 03/2020 w/ CTO of RCA.  Mild HsTrop elevation in  setting of above.  Med rx has been on hold.  No plans for ischemic eval.  8.  HL:  Statin on hold in setting of LFT elevation.  9.  Weight Loss:  As outlined above, significant drop in weight since last hospitalization.  Prior d/c weight recorded @ 66 kg (confirmed this was bed scale wt via pt station).  She was 58.1 kg @ cardiology office visit 6/24 (standing scale), and is now 47.1 kg (standing scale).  Though dehydration may have played a role, w/ stabilization of renal fxn, it appears malnutrition and/or other factors may be at play.    Signed, Murray Hodgkins, NP  06/09/2021, 8:01 AM    For questions or updates, please contact   Please consult [email or web address] for contact info under Cardiology/STEMI.    I have seen, examined the patient, and reviewed the above assessment and plan.  Changes to above are made where necessary.  On exam, trail and thin,  RRR.  She is appears to be improving with hydration.  Supportive care at this point.  She does not require aggressive cardiac intervention at this time.  Co Sign: Thompson Grayer, MD 06/09/2021 2:59 PM

## 2021-06-09 NOTE — Plan of Care (Signed)
  Problem: Education: Goal: Knowledge of General Education information will improve Description: Including pain rating scale, medication(s)/side effects and non-pharmacologic comfort measures Outcome: Progressing   Problem: Health Behavior/Discharge Planning: Goal: Ability to manage health-related needs will improve Outcome: Progressing   Problem: Clinical Measurements: Goal: Ability to maintain clinical measurements within normal limits will improve Outcome: Progressing Goal: Will remain free from infection Outcome: Progressing Goal: Diagnostic test results will improve Outcome: Progressing Goal: Respiratory complications will improve Outcome: Progressing Goal: Cardiovascular complication will be avoided Outcome: Progressing   Problem: Activity: Goal: Risk for activity intolerance will decrease Outcome: Progressing   Problem: Nutrition: Goal: Adequate nutrition will be maintained Outcome: Progressing   Problem: Elimination: Goal: Will not experience complications related to bowel motility Outcome: Progressing Goal: Will not experience complications related to urinary retention Outcome: Progressing   Problem: Pain Managment: Goal: General experience of comfort will improve Outcome: Progressing   Problem: Safety: Goal: Ability to remain free from injury will improve Outcome: Progressing   Problem: Skin Integrity: Goal: Risk for impaired skin integrity will decrease Outcome: Progressing   Problem: Education: Goal: Knowledge of disease and its progression will improve Outcome: Progressing Goal: Individualized Educational Video(s) Outcome: Progressing   Problem: Fluid Volume: Goal: Compliance with measures to maintain balanced fluid volume will improve Outcome: Progressing   Problem: Clinical Measurements: Goal: Complications related to the disease process, condition or treatment will be avoided or minimized Outcome: Progressing

## 2021-06-09 NOTE — Progress Notes (Signed)
PROGRESS NOTE    Anita [name]  TIW:[id number] DOB: 29-Mar-1961 DOA: 06/04/2021 PCP: [name], NP    Chief Complaint  Patient presents with   Hypotension   Rectal Bleeding    Brief Narrative:  Anita [name] is Anita [name] 60 y.o. Caucasian female with medical history significant for GERD, migraine, coronary artery disease, heart failure with reduced ejection fraction, COPD, GERD and multiple sclerosis, who presented to the emergency room with acute onset of diarrhea with associated black tarry stools per her report without bright red bleeding per rectum.  Also reported hypotension. The patient was recently admitted here on 6/11 till 6/21 for acute on chronic combined systolic and diastolic CHF.    Assessment & Plan:   Active Problems:   AKI (acute kidney injury) ([place])   Elevated liver enzymes   Dehydration  # Orthostatic Hypotension Seems improved, though still with relative hypotension Thigh high compression stockings (apparently is refusing these) Hold antihypertensives Add midodrine and follow  Appreciate cardiology assistance - getting gentle hydration  # Severe Mitral Regurgitation  Heart Failure Given significant orthostasis, will c/s cardiology 6/30, appreciate assistance Not felt to be surgical/mitraclip candidate per cardiology HF meds on hold with hypotension above - per cards Gentle IVF as noted above  1.  Acute kidney injury superimposed on stage IIIa chronic kidney disease - 2/2 diarrhea +/- dehydration with diuresis.  ? GI bleed as well with concern for melena. 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  Hypokalemia. - improved, follow   3.  Mild hyponatremia. - improved with IVF  4.  Elevated LFTs. - This could still be related to dehydration/shock liver with relative hypotension -- worsening today - acute hepatitis panel - crestor on hold - will continue to monitor - she's been getting tylenol relatively regularly as well, will hold -- trend, follow - mildly improved today -- RUQ [place] with dilated gallbladder, nonshadowing sludge balls --- if worsening, consider repeat  5.  Pyuria with rare bacteriuria and no urinary symptoms. - cx with multiple species, follow  6.  Heart failure with reduced ejection fraction, compensated. - Hold farxiga.  Continue to hold off Entresto and Aldactone given acute kidney injury and hypotension.  Hold coreg.  7.  Dyslipidemia. - We will continue statin therapy.  Elevated LFTs are very mild to hold off.  8.  Coronary artery disease. - We will continue as needed sublingual nitroglycerin, statin therapy .  Hold coreg.  9.  COPD. - We will continue her Advair Diskus.  10.  Multiple sclerosis. - at baseline has difficulty with strength, balance, bowel/bladder incontinence.  Notes she walks with walker.  DVT prophylaxis: SCD Code Status: full  Family Communication: brother at bedside Disposition:   Status is: Inpatient  Remains inpatient appropriate because:Inpatient level of care appropriate due to severity of illness  Dispo: The patient is from: Home              Anticipated d/c is to: Home              Patient currently is not medically stable to  d/c.   Difficult to place patient No       Consultants:  none  Procedures:  none  Antimicrobials: Anti-infectives (From admission, onward)    None          Subjective: No complaints  Objective: Vitals:   06/09/21 0729 06/09/21 0731 06/09/21 1218 06/09/21 1453  BP: 116/79 113/72  94/65  Pulse: (!) 107 (!) 104  89  Resp:    18  Temp:    98.7 F (37.1 C)  TempSrc:      SpO2: 97% (!) 87%  100%  Weight:   47.1 kg   Height:        Intake/Output Summary (Last 24 hours) at 06/09/2021 1721 Last data filed at 06/09/2021 1600 Gross per 24 hour  Intake 1786.63 ml  Output 1400 ml  Net 386.63 ml   Filed Weights   06/08/21 0546 06/09/21 0604 06/09/21 1218  Weight: 50 kg 51 kg 47.1 kg    Examination:  General: No acute distress. Cardiovascular: Heart sounds show Anita [name] regular rate, and rhythm. Lungs: Clear to auscultation bilaterally  Abdomen: Soft, nontender, nondistended  Neurological: Alert and oriented 3. Moves all extremities 4 . Cranial nerves II through XII grossly intact. Skin: Warm and dry. No rashes or lesions. Extremities: No clubbing or cyanosis. No edema.   Data Reviewed: I have personally reviewed following labs and imaging studies  CBC: Recent Labs  Lab 06/05/21 0825 06/05/21 1409 06/05/21 2029 06/06/21 0417 06/07/21 0335 06/08/21 0516 06/09/21 0410  WBC 5.7  --   --  5.9 7.7 8.9 8.3  NEUTROABS  --   --   --  3.7 5.6 6.7 5.9  HGB 11.5*   < > 10.6* 10.6* 10.0* 10.0* 9.7*  HCT 36.5   < > 32.8* 32.9* 30.3* 31.2* 31.6*  MCV 89.0  --   --  87.7 84.9 88.9 89.8  PLT 220  --   --  247 250 264 327   < > = values in this interval not displayed.    Basic Metabolic Panel: Recent Labs  Lab 06/05/21 0825 06/06/21 0417 06/07/21 0335 06/08/21 0516 06/09/21 0410  NA 136 133* 133* 135 135  K 4.4 4.0 4.0 4.0 3.8  CL 100 102 101 102 104  CO2 24 26 25 24 24   GLUCOSE 87 84 95 81 93  BUN 48* 41* 37* 35* 28*  CREATININE 1.95* 1.71* 1.58* 1.54* 1.22*   CALCIUM 9.0 9.0 8.7* 8.8* 8.7*  MG  --  1.8 1.7 1.9 1.8  PHOS  --  3.5 3.4  --  3.1    GFR: Estimated Creatinine Clearance: 37.4 mL/min (Anita [name]) (by C-G formula based on SCr of 1.22 mg/dL (H)).  Liver Function Tests: Recent Labs  Lab 06/04/21 1208 06/06/21 0417 06/07/21 0335 06/08/21 0516 06/09/21 0410  AST 59* 57* 42* 148* 127*  ALT 48* 48* 39 134* 119*  ALKPHOS 173* 149* 142* 278* 261*  BILITOT 0.8 0.7 0.7 0.8 0.5  PROT 7.8 6.7 6.7 6.7 6.6  ALBUMIN 3.5 3.1* 3.1* 3.0* 2.9*    CBG: No  results for input(s): GLUCAP in the last 168 hours.   Recent Results (from the past 240 hour(s))  Urine Culture     Status: Abnormal   Collection Time: 06/04/21  2:56 PM   Specimen: Urine, Random  Result Value Ref Range Status   Specimen Description   Final    URINE, RANDOM Performed at [facility], [address], [place], [place] [address]    Special Requests   Final    NONE Performed at [facility], [address] [place], [place] [address]    Culture MULTIPLE SPECIES PRESENT, SUGGEST RECOLLECTION (Anita [name])  Final   Report Status 06/06/2021 FINAL  Final  Resp Panel by RT-PCR (Flu Anita [name]&B, Covid) Nasopharyngeal Swab     Status: None   Collection Time: 06/04/21  8:23 PM   Specimen: Nasopharyngeal Swab; Nasopharyngeal(NP) swabs in vial transport medium  Result Value Ref Range Status   SARS Coronavirus 2 by RT PCR NEGATIVE NEGATIVE Final    Comment: (NOTE) SARS-CoV-2 target nucleic acids are NOT DETECTED.  The SARS-CoV-2 RNA is generally detectable in upper respiratory specimens during the acute phase of infection. The lowest concentration of SARS-CoV-2 viral copies this assay can detect is 138 copies/mL. Anita [name] negative result does not preclude SARS-Cov-2 infection and should not be used as the sole basis for treatment or other patient management decisions. Anita [name] negative result may occur with  improper specimen collection/handling, submission of specimen other than  nasopharyngeal swab, presence of viral mutation(s) within the areas targeted by this assay, and inadequate number of viral copies(<138 copies/mL). Ayrabella Labombard negative result must be combined with clinical observations, patient history, and epidemiological information. The expected result is Negative.  Fact Sheet for Patients:  EntrepreneurPulse.com.au  Fact Sheet for Healthcare Providers:  IncredibleEmployment.be  This test is no t yet approved or cleared by the [place] FDA and  has been authorized for detection and/or diagnosis of SARS-CoV-2 by FDA under an Emergency Use Authorization (EUA). This EUA will remain  in effect (meaning this test can be used) for the duration of the COVID-19 declaration under Section 564(b)(1) of the Act, 21 U.S.C.section 360bbb-3(b)(1), unless the authorization is terminated  or revoked sooner.       Influenza Anita [name] by PCR NEGATIVE NEGATIVE Final   Influenza B by PCR NEGATIVE NEGATIVE Final    Comment: (NOTE) The Xpert Xpress SARS-CoV-2/FLU/RSV plus assay is intended as an aid in the diagnosis of influenza from Nasopharyngeal swab specimens and should not be used as Anita [name] sole basis for treatment. Nasal washings and aspirates are unacceptable for Xpert Xpress SARS-CoV-2/FLU/RSV testing.  Fact Sheet for Patients: EntrepreneurPulse.com.au  Fact Sheet for Healthcare Providers: IncredibleEmployment.be  This test is not yet approved or cleared by the [place] FDA and has been authorized for detection and/or diagnosis of SARS-CoV-2 by FDA under an Emergency Use Authorization (EUA). This EUA will remain in effect (meaning this test can be used) for the duration of the COVID-19 declaration under Section 564(b)(1) of the Act, 21 U.S.C. section 360bbb-3(b)(1), unless the authorization is terminated or revoked.  Performed at [facility], [address] [place], [place]  [address]          Radiology Studies: No results found.      Scheduled Meds:  acidophilus  1 capsule Oral Daily   ferrous sulfate  325 mg Oral BID WC   loratadine  10 mg Oral Daily   midodrine  5 mg Oral TID WC   mirtazapine  15 mg  Oral QHS   mometasone-formoterol  2 puff Inhalation BID   multivitamin with minerals  1 tablet Oral Daily   pantoprazole  40 mg Oral Daily   polyethylene glycol  17 g Oral BID   vitamin B-12  1,000 mcg Oral Daily   Continuous Infusions:  sodium chloride 50 mL/hr at 06/09/21 0846      LOS: 5 days    Time spent: over 30 min    Fayrene Helper, MD [facility]   To contact the attending provider between 7A-7P or the covering provider during after hours 7P-7A, please log into the web site [email or web address] and access using universal [place] password for that web site. If you do not have the password, please call the hospital operator.  06/09/2021, 5:21 PM

## 2021-06-09 NOTE — Addendum Note (Signed)
Addended by: Buford Dresser A on: 06/09/2021 10:15 PM   Modules accepted: Level of Service

## 2021-06-10 DIAGNOSIS — E861 Hypovolemia: Secondary | ICD-10-CM

## 2021-06-10 DIAGNOSIS — I25118 Atherosclerotic heart disease of native coronary artery with other forms of angina pectoris: Secondary | ICD-10-CM

## 2021-06-10 DIAGNOSIS — R197 Diarrhea, unspecified: Secondary | ICD-10-CM

## 2021-06-10 DIAGNOSIS — I420 Dilated cardiomyopathy: Secondary | ICD-10-CM

## 2021-06-10 DIAGNOSIS — R7401 Elevation of levels of liver transaminase levels: Secondary | ICD-10-CM

## 2021-06-10 LAB — CBC WITH DIFFERENTIAL/PLATELET
Abs Immature Granulocytes: 0.04 10*3/uL (ref 0.00–0.07)
Basophils Absolute: 0.1 10*3/uL (ref 0.0–0.1)
Basophils Relative: 1 %
Eosinophils Absolute: 0.2 10*3/uL (ref 0.0–0.5)
Eosinophils Relative: 3 %
HCT: 30.1 % — ABNORMAL LOW (ref 36.0–46.0)
Hemoglobin: 9.7 g/dL — ABNORMAL LOW (ref 12.0–15.0)
Immature Granulocytes: 1 %
Lymphocytes Relative: 13 %
Lymphs Abs: 1 10*3/uL (ref 0.7–4.0)
MCH: 28.1 pg (ref 26.0–34.0)
MCHC: 32.2 g/dL (ref 30.0–36.0)
MCV: 87.2 fL (ref 80.0–100.0)
Monocytes Absolute: 1 10*3/uL (ref 0.1–1.0)
Monocytes Relative: 13 %
Neutro Abs: 5.6 10*3/uL (ref 1.7–7.7)
Neutrophils Relative %: 69 %
Platelets: 334 10*3/uL (ref 150–400)
RBC: 3.45 MIL/uL — ABNORMAL LOW (ref 3.87–5.11)
RDW: 16.4 % — ABNORMAL HIGH (ref 11.5–15.5)
WBC: 8 10*3/uL (ref 4.0–10.5)
nRBC: 0 % (ref 0.0–0.2)

## 2021-06-10 LAB — COMPREHENSIVE METABOLIC PANEL
ALT: 142 U/L — ABNORMAL HIGH (ref 0–44)
AST: 155 U/L — ABNORMAL HIGH (ref 15–41)
Albumin: 3 g/dL — ABNORMAL LOW (ref 3.5–5.0)
Alkaline Phosphatase: 273 U/L — ABNORMAL HIGH (ref 38–126)
Anion gap: 7 (ref 5–15)
BUN: 22 mg/dL — ABNORMAL HIGH (ref 6–20)
CO2: 25 mmol/L (ref 22–32)
Calcium: 8.7 mg/dL — ABNORMAL LOW (ref 8.9–10.3)
Chloride: 104 mmol/L (ref 98–111)
Creatinine, Ser: 1.14 mg/dL — ABNORMAL HIGH (ref 0.44–1.00)
GFR, Estimated: 56 mL/min — ABNORMAL LOW (ref >60)
Glucose, Bld: 103 mg/dL — ABNORMAL HIGH (ref 70–99)
Potassium: 3.9 mmol/L (ref 3.5–5.1)
Sodium: 136 mmol/L (ref 135–145)
Total Bilirubin: 0.7 mg/dL (ref 0.3–1.2)
Total Protein: 6.9 g/dL (ref 6.5–8.1)

## 2021-06-10 LAB — PHOSPHORUS: Phosphorus: 3.4 mg/dL (ref 2.5–4.6)

## 2021-06-10 LAB — MAGNESIUM: Magnesium: 1.7 mg/dL (ref 1.7–2.4)

## 2021-06-10 MED ORDER — ENSURE ENLIVE PO LIQD
237.0000 mL | Freq: Two times a day (BID) | ORAL | Status: DC
  Administered 2021-06-10 – 2021-06-11 (×3): 237 mL via ORAL

## 2021-06-10 NOTE — Care Management Important Message (Signed)
Important Message  Patient Details  Name: Anita [name] MRN: [id number] Date of Birth: 1961-06-02   Medicare Important Message Given:  Yes     Dannette Barbara 06/10/2021, 1:22 PM

## 2021-06-10 NOTE — Progress Notes (Signed)
PROGRESS NOTE    Anita [name]  PYP:[id number] DOB: 05-Jul-1961 DOA: 06/04/2021 PCP: [name], NP    Chief Complaint  Patient presents with   Hypotension   Rectal Bleeding    Brief Narrative:  Anita [name] is Anita [name] 60 y.o. Caucasian female with medical history significant for GERD, migraine, coronary artery disease, heart failure with reduced ejection fraction, COPD, GERD and multiple sclerosis, who presented to the emergency room with acute onset of diarrhea with associated black tarry stools per her report without bright red bleeding per rectum.  Also reported hypotension. The patient was recently admitted here on 6/11 till 6/21 for acute on chronic combined systolic and diastolic CHF.    Assessment & Plan:   Active Problems:   AKI (acute kidney injury) ([place])   Elevated liver enzymes   Dehydration  # Orthostatic Hypotension Seems improved, though still with relative hypotension Thigh high compression stockings (apparently is refusing these) Hold antihypertensives Continue  midodrine and follow  Appreciate cardiology assistance  # Severe Mitral Regurgitation  Heart Failure Given significant orthostasis, will c/s cardiology 6/30, appreciate assistance Not felt to be surgical/mitraclip candidate per cardiology HF meds on hold with hypotension above - per cards Gentle IVF as noted above  1.  Acute kidney injury superimposed on stage IIIa chronic kidney disease - 2/2 diarrhea +/- dehydration with diuresis.  ? GI bleed as well with concern for melena. 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  Hypokalemia. - improved, follow   3.  Mild hyponatremia. - improved with IVF  4.  Elevated LFTs. - This could still be related to dehydration/shock liver with relative hypotension - acute hepatitis panel - crestor on hold - will continue to monitor - she's been getting tylenol relatively regularly as well, will hold -- trend, follow - fluctuating, no clear underlying etiology will continue to follow -- RUQ [place] with dilated gallbladder, nonshadowing sludge balls --- if worsening, consider repeat  5.  Pyuria with rare bacteriuria and no urinary symptoms. - cx with multiple species, follow  6.  Heart failure with reduced ejection fraction, compensated. - Hold farxiga.  Continue to hold off Entresto and Aldactone given acute kidney injury and hypotension.  Hold coreg.  7.  Dyslipidemia. - We will continue statin therapy.  Elevated LFTs are very mild to hold off.  8.  Coronary artery disease. - We will continue as needed sublingual nitroglycerin, statin therapy .  Hold coreg.  9.  COPD. - We will continue her Advair Diskus.  10.  Multiple sclerosis. - at baseline has difficulty with strength, balance,  bowel/bladder incontinence.  Notes she walks with walker.  DVT prophylaxis: SCD Code Status: full  Family Communication: daughter over phone Disposition:   Status is: Inpatient  Remains inpatient appropriate because:Inpatient level of care appropriate due to severity of illness  Dispo: The patient  is from: Home              Anticipated d/c is to: Home              Patient currently is not medically stable to d/c.   Difficult to place patient No       Consultants:  cards  Procedures:  none  Antimicrobials: Anti-infectives (From admission, onward)    None          Subjective: No new complaints today, discussed dc possible tmrw  Objective: Vitals:   06/09/21 1928 06/10/21 0412 06/10/21 1116 06/10/21 1642  BP: 102/60  107/80 103/62  Pulse: 90  91 82  Resp: 17  16 18   Temp: 98.6 F (37 C)  98.7 F (37.1 C) 98.2 F (36.8 C)  TempSrc: Oral  Oral Oral  SpO2: 99% 100% 100% 100%  Weight:  50 kg    Height:        Intake/Output Summary (Last 24 hours) at 06/10/2021 1717 Last data filed at 06/10/2021 1500 Gross per 24 hour  Intake 0 ml  Output 1500 ml  Net -1500 ml   Filed Weights   06/09/21 0604 06/09/21 1218 06/10/21 0412  Weight: 51 kg 47.1 kg 50 kg    Examination:  General: No acute distress. Cardiovascular: RRR Lungs: unlabored Abdomen: Soft, nontender, nondistended Neurological: Alert and oriented 3. Moves all extremities 4. Cranial nerves II through XII grossly intact. Skin: Warm and dry. No rashes or lesions. Extremities: No clubbing or cyanosis. No edema.   Data Reviewed: I have personally reviewed following labs and imaging studies  CBC: Recent Labs  Lab 06/06/21 0417 06/07/21 0335 06/08/21 0516 06/09/21 0410 06/10/21 0403  WBC 5.9 7.7 8.9 8.3 8.0  NEUTROABS 3.7 5.6 6.7 5.9 5.6  HGB 10.6* 10.0* 10.0* 9.7* 9.7*  HCT 32.9* 30.3* 31.2* 31.6* 30.1*  MCV 87.7 84.9 88.9 89.8 87.2  PLT 247 250 264 327 371    Basic Metabolic Panel: Recent Labs  Lab 06/06/21 0417 06/07/21 0335 06/08/21 0516 06/09/21 0410 06/10/21 0403  NA 133* 133* 135 135 136  K 4.0 4.0 4.0 3.8 3.9  CL 102 101 102 104 104  CO2 26 25 24 24 25   GLUCOSE 84 95 81 93 103*  BUN 41* 37* 35* 28* 22*  CREATININE 1.71* 1.58* 1.54* 1.22* 1.14*  CALCIUM 9.0 8.7* 8.8*  8.7* 8.7*  MG 1.8 1.7 1.9 1.8 1.7  PHOS 3.5 3.4  --  3.1 3.4    GFR: Estimated Creatinine Clearance: 42.5 mL/min (Anita [name]) (by C-G formula based on SCr of 1.14 mg/dL (H)).  Liver Function Tests: Recent Labs  Lab 06/06/21 0417 06/07/21 0335 06/08/21 0516 06/09/21 0410 06/10/21 0403  AST 57* 42* 148* 127* 155*  ALT 48* 39 134* 119* 142*  ALKPHOS 149* 142* 278* 261* 273*  BILITOT 0.7 0.7 0.8 0.5 0.7  PROT 6.7 6.7 6.7 6.6 6.9  ALBUMIN 3.1* 3.1* 3.0* 2.9* 3.0*    CBG: No results for input(s): GLUCAP in the last 168 hours.   Recent Results (from the past 240 hour(s))  Urine Culture     Status: Abnormal   Collection Time: 06/04/21  2:56 PM   Specimen: Urine, Random  Result Value  Ref Range Status   Specimen Description   Final    URINE, RANDOM Performed at [facility], [address] [place], [place] [address]    Special Requests   Final    NONE Performed at [facility], [address] [place], [place] [address]    Culture MULTIPLE SPECIES PRESENT, SUGGEST RECOLLECTION (Anita [name])  Final   Report Status 06/06/2021 FINAL  Final  Resp Panel by RT-PCR (Flu Anita [name]&B, Covid) Nasopharyngeal Swab     Status: None   Collection Time: 06/04/21  8:23 PM   Specimen: Nasopharyngeal Swab; Nasopharyngeal(NP) swabs in vial transport medium  Result Value Ref Range Status   SARS Coronavirus 2 by RT PCR NEGATIVE NEGATIVE Final    Comment: (NOTE) SARS-CoV-2 target nucleic acids are NOT DETECTED.  The SARS-CoV-2 RNA is generally detectable in upper respiratory specimens during the acute phase of infection. The lowest concentration of SARS-CoV-2 viral copies this assay can detect is 138 copies/mL. Anita [name] negative result does not preclude SARS-Cov-2 infection and should not be used as the sole basis for treatment or other patient management decisions. Anita [name] negative result may occur with  improper specimen collection/handling, submission of specimen other than nasopharyngeal swab, presence of  viral mutation(s) within the areas targeted by this assay, and inadequate number of viral copies(<138 copies/mL). Anita [name] negative result must be combined with clinical observations, patient history, and epidemiological information. The expected result is Negative.  Fact Sheet for Patients:  EntrepreneurPulse.com.au  Fact Sheet for Healthcare Providers:  IncredibleEmployment.be  This test is no t yet approved or cleared by the [place] FDA and  has been authorized for detection and/or diagnosis of SARS-CoV-2 by FDA under an Emergency Use Authorization (EUA). This EUA will remain  in effect (meaning this test can be used) for the duration of the COVID-19 declaration under Section 564(b)(1) of the Act, 21 U.S.C.section 360bbb-3(b)(1), unless the authorization is terminated  or revoked sooner.       Influenza Anita [name] by PCR NEGATIVE NEGATIVE Final   Influenza B by PCR NEGATIVE NEGATIVE Final    Comment: (NOTE) The Xpert Xpress SARS-CoV-2/FLU/RSV plus assay is intended as an aid in the diagnosis of influenza from Nasopharyngeal swab specimens and should not be used as Anita [name] sole basis for treatment. Nasal washings and aspirates are unacceptable for Xpert Xpress SARS-CoV-2/FLU/RSV testing.  Fact Sheet for Patients: EntrepreneurPulse.com.au  Fact Sheet for Healthcare Providers: IncredibleEmployment.be  This test is not yet approved or cleared by the [place] FDA and has been authorized for detection and/or diagnosis of SARS-CoV-2 by FDA under an Emergency Use Authorization (EUA). This EUA will remain in effect (meaning this test can be used) for the duration of the COVID-19 declaration under Section 564(b)(1) of the Act, 21 U.S.C. section 360bbb-3(b)(1), unless the authorization is terminated or revoked.  Performed at [facility], [address] [place], [place] [address]          Radiology  Studies: No results found.      Scheduled Meds:  acidophilus  1 capsule Oral Daily   feeding supplement  237 mL Oral BID BM   ferrous sulfate  325 mg Oral BID WC   loratadine  10 mg Oral Daily   midodrine  5 mg Oral TID WC   mirtazapine  15 mg Oral QHS   mometasone-formoterol  2 puff Inhalation BID   multivitamin with minerals  1 tablet Oral Daily   pantoprazole  40 mg Oral Daily   polyethylene glycol  17 g Oral BID   vitamin B-12  1,000 mcg Oral Daily   Continuous Infusions:      LOS: 6 days    Time spent: over 30 min    Fayrene Helper, MD [facility]   To contact the attending provider between 7A-7P or the covering provider during after hours 7P-7A, please log into the web site [email or web address] and access using universal [place] password for that web site. If you do not have the password, please call the hospital operator.  06/10/2021, 5:17 PM

## 2021-06-10 NOTE — Progress Notes (Signed)
Progress Note  Patient Name: Anita [name] Date of Encounter: 06/10/2021  Primary Cardiologist: Buford Dresser, MD  Subjective   No c/p, dyspnea, presyncope.  Bored.  Asked if I could speak to dtr.  Inpatient Medications    Scheduled Meds:  acidophilus  1 capsule Oral Daily   ferrous sulfate  325 mg Oral BID WC   loratadine  10 mg Oral Daily   midodrine  5 mg Oral TID WC   mirtazapine  15 mg Oral QHS   mometasone-formoterol  2 puff Inhalation BID   multivitamin with minerals  1 tablet Oral Daily   pantoprazole  40 mg Oral Daily   polyethylene glycol  17 g Oral BID   vitamin B-12  1,000 mcg Oral Daily   Continuous Infusions:  sodium chloride 50 mL/hr at 06/10/21 0409   PRN Meds: albuterol, calcium carbonate, diclofenac Sodium, doxylamine (Sleep), guaiFENesin, morphine injection, nitroGLYCERIN, ondansetron **OR** ondansetron (ZOFRAN) IV, oxyCODONE, SUMAtriptan, traZODone   Vital Signs    Vitals:   06/09/21 1453 06/09/21 1928 06/10/21 0412 06/10/21 1116  BP: 94/65 102/60  107/80  Pulse: 89 90  91  Resp: 18 17  16   Temp: 98.7 F (37.1 C) 98.6 F (37 C)  98.7 F (37.1 C)  TempSrc:  Oral  Oral  SpO2: 100% 99% 100% 100%  Weight:   50 kg   Height:        Intake/Output Summary (Last 24 hours) at 06/10/2021 1159 Last data filed at 06/10/2021 1003 Gross per 24 hour  Intake 147.24 ml  Output 1350 ml  Net -1202.76 ml   Filed Weights   06/09/21 0604 06/09/21 1218 06/10/21 0412  Weight: 51 kg 47.1 kg 50 kg    Physical Exam   GEN: Thin, frail, in no acute distress.  HEENT: Grossly normal.  Neck: Supple, no JVD, carotid bruits, or masses. Cardiac: RRR, 3/6 syst murmur throughout - loust @ apex  Axilla, no rubs, or gallops. No clubbing, cyanosis, edema.  Radials 2+, DP/PT 2+ and equal bilaterally.  Respiratory:  Respirations regular and unlabored, clear to auscultation bilaterally. GI: Soft, nontender, nondistended, BS + x 4. MS: no deformity or  atrophy. Skin: warm and dry, no rash. Neuro:  Strength and sensation are intact. Psych: AAOx3.  Normal affect.  Labs    Chemistry Recent Labs  Lab 06/08/21 0516 06/09/21 0410 06/10/21 0403  NA 135 135 136  K 4.0 3.8 3.9  CL 102 104 104  CO2 24 24 25   GLUCOSE 81 93 103*  BUN 35* 28* 22*  CREATININE 1.54* 1.22* 1.14*  CALCIUM 8.8* 8.7* 8.7*  PROT 6.7 6.6 6.9  ALBUMIN 3.0* 2.9* 3.0*  AST 148* 127* 155*  ALT 134* 119* 142*  ALKPHOS 278* 261* 273*  BILITOT 0.8 0.5 0.7  GFRNONAA 39* 51* 56*  ANIONGAP 9 7 7      Hematology Recent Labs  Lab 06/08/21 0516 06/09/21 0410 06/10/21 0403  WBC 8.9 8.3 8.0  RBC 3.51* 3.52* 3.45*  HGB 10.0* 9.7* 9.7*  HCT 31.2* 31.6* 30.1*  MCV 88.9 89.8 87.2  MCH 28.5 27.6 28.1  MCHC 32.1 30.7 32.2  RDW 16.3* 16.4* 16.4*  PLT 264 327 334    Cardiac Enzymes  Recent Labs  Lab 05/18/21 2205 05/18/21 2356  TROPONINIHS 20* 18*      BNP Recent Labs  Lab 06/04/21 1220  BNP 930.0*    Lipids  Lab Results  Component Value Date   CHOL 104 01/08/2021  HDL 40.20 01/08/2021   LDLCALC 51 01/08/2021   TRIG 66.0 01/08/2021   CHOLHDL 3 01/08/2021   Radiology    No results found.  Telemetry    Not on tele  Cardiac Studies   Cardiac Catheterization  4.16.2021  Medically managed      2D Echocardiogram 3.3.2022    1. Left ventricular ejection fraction, by estimation, is 40 to 45%. Left  ventricular ejection fraction by 3D volume is 47 %. The left ventricle has  mildly decreased function. The left ventricle demonstrates global  hypokinesis. Left ventricular diastolic   parameters are consistent with Grade II diastolic dysfunction  (pseudonormalization). Elevated left ventricular end-diastolic pressure.   2. Right ventricular systolic function is severely reduced. The right  ventricular size is mildly enlarged. There is severely elevated pulmonary  artery systolic pressure. The estimated right ventricular systolic  pressure is  65.7 mmHg.   3. Left atrial size was moderately dilated.   4. Right atrial size was severely dilated.   5. The mitral valve is degenerative with mild to moderate thickening of  the mitral valve leaflets. There is teathering of the MV leaflets due to  mild LV dysfunction. Severe mitral valve regurgitation. Mild to moderate  mitral stenosis. The mean mitral  valve gradient is 6.5 mmHg.   6. Tricuspid valve regurgitation is moderate to severe.   7. The aortic valve is tricuspid. Aortic valve regurgitation is mild.  Mild aortic valve sclerosis is present, with no evidence of aortic valve  stenosis. Aortic regurgitation PHT measures 499 msec.   8. The inferior vena cava is dilated in size with <50% respiratory  variability, suggesting right atrial pressure of 15 mmHg.   9. There is a trivial pericardial effusion posterior to the left  ventricle.  10. Compared to prior echo, Mitral regurgitation appears worse and RV  dysfunction is now present. There is severe Pulmonary HTN.  Patient Profile     60 year old female with a history of medically managed multivessel CAD, chronic combined systolic and diastolic congestive heart failure, ischemic cardiomyopathy, moderate to severe mitral regurgitation with rheumatic appearing mitral valve (without severe mitral stenosis) pulmonary hypertension, multiple sclerosis, COPD, anemia, prior tobacco abuse, and chronic dyspnea, who was admitted June 11 with volume overload/anasarca and successfully diuresed and then readmitted 6/28 w/ volume depletion, diarrhea/melena, and hypotension.  Assessment & Plan    1.  Hypotension:  She had recent prolonged hospitalization for CHF w/ aggressive and successful diuresis.  She was d/c'd on 6/21 @ 66kg on coreg, entresto, spiro, torsemide, and farxiga.  At f/u 6/24, wt was down an additional 7.9kg.  Admitted June 28 with 4 episodes of diarrhea and hypotension.  In ED, AKI and likely mild shock liver was noted w/ creat of  2.44 and AST/ALT 59/48.  Home CHF meds have been on hold and she has been receiving 0.9NS @ 31ml/hr. also on midodrine 3 times daily since admission.  Patient feels well and pressures have been relatively stable.  Overall, she has had significant weight loss since her hospitalization and was down to 47 kg yesterday.  This is likely driving hypotension and intolerance to heart failure meds.  We will stop IV fluids today as renal function has stabilized.  We will continue to defer use of heart failure meds.  Discussed patient's hospitalization with daughter in detail today.  2.  Acute kidney injury: Resolved with hydration and holding heart failure meds.  3.  Abnormal LFTs: LFTs mildly elevated in the setting of  hypotension admission but have since risen further today.  Rosuvastatin and acetaminophen on hold.  She does have pulmonary hypertension and thus is at risk for hepatic congestion, though no evidence of right heart failure on examination.  Stopping IV fluid.  Management per internal medicine.  4.  Diarrhea/anemia/melena: Diarrhea and dark stool reported on June 28.  4 episodes that day without any recurrence since.  H&H stable throughout admission.  Outpatient GI eval per internal medicine.  5.  Chronic combined systolic and diastolic congestive heart failure/pulmonary arterial hypertension: Standing scale weight pending this morning but she was down to 47 kg yesterday.  Euvolemic on exam.  Blood pressure stable on midodrine however, not quite at a point where I think she would tolerate previous heart failure medications which included carvedilol 3.125 bid, farxiga 10 qd, entresto 24-36 bid, spiro 25 qd, and torsemide 40 qd.  Discussed this with daughter this morning.  We will continue to watch volume closely and plan on early outpatient follow-up and resumption of heart failure medications as appropriate.  6.  Moderate to severe mitral regurgitation/mild to moderate mitral stenosis: Not felt to be a  surgical or MitraClip candidate.  7.  Coronary artery disease: Multivessel CAD on cath in April 2021 with chronic total occlusion of the right coronary artery.  Mild high-sensitivity troponin elevation in the setting of above.  Medical therapy has been on hold.  No role for ischemic evaluation at this time.  8.  Hyperlipidemia: Statin therapy on hold in the setting of LFT elevation.  9.  Weight loss: As previously noted, significant drop in weight since last hospitalization.  She was 58.1 kg at cardiology office visit June 24 and 47.1 kg by standing scale on July 3.  Weight pending this morning.  With stabilization of renal function, weight loss not entirely explained by dehydration.  I discussed with daughter today.  She has also noted muscle wasting.  She notes that when at home, her mother seem to have a very good appetite and would "graze" throughout the day.  Patient was using Ensure sometimes but not regularly.  Will order.  Signed, Murray Hodgkins, NP  06/10/2021, 11:59 AM    For questions or updates, please contact   Please consult [email or web address] for contact info under Cardiology/STEMI.

## 2021-06-11 LAB — HEPATIC FUNCTION PANEL
ALT: 172 U/L — ABNORMAL HIGH (ref 0–44)
AST: 177 U/L — ABNORMAL HIGH (ref 15–41)
Albumin: 3.1 g/dL — ABNORMAL LOW (ref 3.5–5.0)
Alkaline Phosphatase: 245 U/L — ABNORMAL HIGH (ref 38–126)
Bilirubin, Direct: 0.2 mg/dL (ref 0.0–0.2)
Indirect Bilirubin: 0.5 mg/dL (ref 0.3–0.9)
Total Bilirubin: 0.7 mg/dL (ref 0.3–1.2)
Total Protein: 6.9 g/dL (ref 6.5–8.1)

## 2021-06-11 LAB — PROTIME-INR
INR: 1.2 (ref 0.8–1.2)
Prothrombin Time: 15.5 seconds — ABNORMAL HIGH (ref 11.4–15.2)

## 2021-06-11 LAB — CBC WITH DIFFERENTIAL/PLATELET
Abs Immature Granulocytes: 0.03 10*3/uL (ref 0.00–0.07)
Basophils Absolute: 0.1 10*3/uL (ref 0.0–0.1)
Basophils Relative: 1 %
Eosinophils Absolute: 0.2 10*3/uL (ref 0.0–0.5)
Eosinophils Relative: 2 %
HCT: 30.7 % — ABNORMAL LOW (ref 36.0–46.0)
Hemoglobin: 9.5 g/dL — ABNORMAL LOW (ref 12.0–15.0)
Immature Granulocytes: 0 %
Lymphocytes Relative: 11 %
Lymphs Abs: 1 10*3/uL (ref 0.7–4.0)
MCH: 27.6 pg (ref 26.0–34.0)
MCHC: 30.9 g/dL (ref 30.0–36.0)
MCV: 89.2 fL (ref 80.0–100.0)
Monocytes Absolute: 1.1 10*3/uL — ABNORMAL HIGH (ref 0.1–1.0)
Monocytes Relative: 12 %
Neutro Abs: 6.5 10*3/uL (ref 1.7–7.7)
Neutrophils Relative %: 74 %
Platelets: 360 10*3/uL (ref 150–400)
RBC: 3.44 MIL/uL — ABNORMAL LOW (ref 3.87–5.11)
RDW: 16.7 % — ABNORMAL HIGH (ref 11.5–15.5)
WBC: 8.9 10*3/uL (ref 4.0–10.5)
nRBC: 0 % (ref 0.0–0.2)

## 2021-06-11 LAB — COMPREHENSIVE METABOLIC PANEL
ALT: 172 U/L — ABNORMAL HIGH (ref 0–44)
AST: 180 U/L — ABNORMAL HIGH (ref 15–41)
Albumin: 3.1 g/dL — ABNORMAL LOW (ref 3.5–5.0)
Alkaline Phosphatase: 229 U/L — ABNORMAL HIGH (ref 38–126)
Anion gap: 5 (ref 5–15)
BUN: 20 mg/dL (ref 6–20)
CO2: 29 mmol/L (ref 22–32)
Calcium: 8.8 mg/dL — ABNORMAL LOW (ref 8.9–10.3)
Chloride: 102 mmol/L (ref 98–111)
Creatinine, Ser: 1.13 mg/dL — ABNORMAL HIGH (ref 0.44–1.00)
GFR, Estimated: 56 mL/min — ABNORMAL LOW (ref >60)
Glucose, Bld: 95 mg/dL (ref 70–99)
Potassium: 4.3 mmol/L (ref 3.5–5.1)
Sodium: 136 mmol/L (ref 135–145)
Total Bilirubin: 0.7 mg/dL (ref 0.3–1.2)
Total Protein: 6.9 g/dL (ref 6.5–8.1)

## 2021-06-11 LAB — MAGNESIUM: Magnesium: 1.7 mg/dL (ref 1.7–2.4)

## 2021-06-11 LAB — PHOSPHORUS: Phosphorus: 3.6 mg/dL (ref 2.5–4.6)

## 2021-06-11 MED ORDER — TORSEMIDE 20 MG PO TABS: ORAL_TABLET | ORAL | 0 refills | Status: DC

## 2021-06-11 MED ORDER — MIDODRINE HCL 5 MG PO TABS: 5.0000 mg | ORAL_TABLET | Freq: Three times a day (TID) | ORAL | 0 refills | Status: DC

## 2021-06-11 MED ORDER — DICLOFENAC SODIUM 1 % EX GEL
2.0000 g | Freq: Four times a day (QID) | CUTANEOUS | Status: DC
  Administered 2021-06-11: 2 g via TOPICAL
  Filled 2021-06-11: qty 100

## 2021-06-11 MED ORDER — DICLOFENAC SODIUM 1 % EX GEL
2.0000 g | Freq: Four times a day (QID) | CUTANEOUS | 1 refills | Status: DC | PRN
Start: 2021-06-11 — End: 2022-03-28

## 2021-06-11 NOTE — Progress Notes (Signed)
Physical Therapy Treatment Patient Details Name: Anita [name] MRN: [id number] DOB: 12-19-1960 Today's Date: 06/11/2021    History of Present Illness Pt is a 60 y.o. F w/ PMH of migraine, CAD, HF, COPD, GERD, MS presenting to ED for hypotension and acute diarrhea. Recent admission 6/11 to 6/21 for acute on chronic CHF.    PT Comments    Pt alert, willing to work with PT, reports having a "good weekend." Pt continues to note sx of lightheadedness at rest. BP: 106/70 (supine), BP: 110/73 (seated), BP: 107/70s (standing). Pt requires bed rails for bed mobility, supervision. Performed sit <> stand x 2 with min-guard & RW, ambulated 4 steps w/ min-guard, RW. Pt denied further ambulation despite pt education. Skilled PT intervention is indicated to address deficits in function, mobility, and to return to PLOF as able.  Discharge recommendation are SNF w/ 24-hr supervision/assistance.    Follow Up Recommendations  Supervision/Assistance - 24 hour;SNF     Equipment Recommendations  Rolling walker with 5" wheels    Recommendations for Other Services       Precautions / Restrictions Precautions Precautions: Fall Restrictions Weight Bearing Restrictions: No    Mobility  Bed Mobility Overal bed mobility: Needs Assistance Bed Mobility: Sit to Supine;Supine to Sit Rolling: Supervision   Supine to sit: Supervision Sit to supine: Supervision   General bed mobility comments: Uses bed rails    Transfers Overall transfer level: Needs assistance Equipment used: Rolling walker (2 wheeled) Transfers: Sit to/from Stand Sit to Stand: Min guard         General transfer comment: prefers close gaurding  Ambulation/Gait Ambulation/Gait assistance: Min guard Gait Distance (Feet): 4 Feet Assistive device: Rolling walker (2 wheeled)       General Gait Details: pt notes lightheadedness, general malaise   Stairs             Wheelchair Mobility    Modified Rankin (Stroke  Patients Only)       Balance Overall balance assessment: Needs assistance Sitting-balance support: Feet unsupported;Single extremity supported Sitting balance-Leahy Scale: Good Sitting balance - Comments: able to reach across midline   Standing balance support: Bilateral upper extremity supported Standing balance-Leahy Scale: Fair Standing balance comment: assist for safety                            Cognition Arousal/Alertness: Awake/alert Behavior During Therapy: WFL for tasks assessed/performed Overall Cognitive Status: Within Functional Limits for tasks assessed                                 General Comments: pleasant w/ PT      Exercises General Exercises - Upper Extremity Shoulder Flexion: AROM;Strengthening;Both;20 reps;Seated;Theraband Theraband Level (Shoulder Flexion): Level 2 (Red) Shoulder Extension: AROM;Strengthening;Both;20 reps;Seated;Theraband Theraband Level (Shoulder Extension): Level 2 (Red) Elbow Flexion: AROM;Strengthening;Both;20 reps;Seated;Theraband Theraband Level (Elbow Flexion): Level 2 (Red) Elbow Extension: AROM;Strengthening;Both;20 reps;Seated;Theraband Theraband Level (Elbow Extension): Level 2 (Red) General Exercises - Lower Extremity Gluteal Sets: AROM;Strengthening;Both;20 reps;Seated Long Arc Quad: AROM;Strengthening;Both;20 reps;Seated Hip ABduction/ADduction: AROM;Strengthening;Both;20 reps;Seated Straight Leg Raises: AROM;Strengthening;Both;20 reps;Seated Hip Flexion/Marching: AROM;Strengthening;Both;20 reps;Seated Other Exercises Other Exercises: Pt educated re: OT role, DME recs, d/c recs, falls prevention, importance of mobility for funcitonal strengthening, HEP Other Exercises: sup<>sit, sitting balance/tolerance    General Comments General comments (skin integrity, edema, etc.): Pt reports lightheadedness sitting BP: 110/73      Pertinent Vitals/Pain Pain  Assessment: No/denies pain Pain Score: 4   Pain Location: Lower back Pain Descriptors / Indicators: Sore Pain Intervention(s): Limited activity within patient's tolerance;Monitored during session;Repositioned    Home Living                      Prior Function            PT Goals (current goals can now be found in the care plan section) Acute Rehab PT Goals Patient Stated Goal: To feel better PT Goal Formulation: With patient Time For Goal Achievement: 06/19/21 Potential to Achieve Goals: Fair Progress towards PT goals: Progressing toward goals    Frequency    Min 2X/week      PT Plan Current plan remains appropriate    Co-evaluation              AM-PAC PT "6 Clicks" Mobility   Outcome Measure  Help needed turning from your back to your side while in a flat bed without using bedrails?: None Help needed moving from lying on your back to sitting on the side of a flat bed without using bedrails?: None Help needed moving to and from a bed to a chair (including a wheelchair)?: A Lot Help needed standing up from a chair using your arms (e.g., wheelchair or bedside chair)?: A Little Help needed to walk in hospital room?: A Lot Help needed climbing 3-5 steps with a railing? : Total 6 Click Score: 16    End of Session Equipment Utilized During Treatment: Gait belt Activity Tolerance: Patient tolerated treatment well Patient left: in bed;with bed alarm set;with call bell/phone within reach   PT Visit Diagnosis: Muscle weakness (generalized) (M62.81);Other abnormalities of gait and mobility (R26.89);History of falling (Z91.81)     Time: 6812-7517 PT Time Calculation (min) (ACUTE ONLY): 23 min  Charges:                        [employer], SPT

## 2021-06-11 NOTE — Progress Notes (Signed)
Occupational Therapy Treatment Patient Details Name: MIKAIA JANVIER MRN: [id number] DOB: 09-Apr-1961 Today's Date: 06/11/2021    History of present illness Pt is a 60 y.o. F w/ PMH of migraine, CAD, HF, COPD, GERD, MS presenting to ED for hypotension and acute diarrhea. Recent admission 6/11 to 6/21 for acute on chronic CHF.   OT comments  Ms Shellhammer was seen for OT treatment on this date. Upon arrival to room pt reclined in bed, requires encouragement to participate in session. Pt refused OOB mobility, required Supervision for bed mobilty and dynamic sitting balance, tolerated ~15 mins. Pt completed seated BUE/BLE therex 1 set x 10-20 reps each as described below using red theraband. Pt making good toward goals. Pt continues to benefit from skilled OT services to maximize return to PLOF and minimize risk of future falls, injury, caregiver burden, and readmission. Will continue to follow POC. Discharge recommendation remains appropriate.    Follow Up Recommendations  SNF    Equipment Recommendations  Other (comment) (defer)    Recommendations for Other Services      Precautions / Restrictions Precautions Precautions: Fall Restrictions Weight Bearing Restrictions: No       Mobility Bed Mobility Overal bed mobility: Needs Assistance Bed Mobility: Supine to Sit;Sit to Supine;Rolling Rolling: Supervision   Supine to sit: Supervision Sit to supine: Supervision        Transfers                 General transfer comment: pt refuses    Balance Overall balance assessment: Needs assistance Sitting-balance support: Feet unsupported;Bilateral upper extremity supported Sitting balance-Leahy Scale: Good                                     ADL either performed or assessed with clinical judgement   ADL Overall ADL's : Needs assistance/impaired                                       General ADL Comments: Pt refused OOB mobility,  required Supervision for bed mobilty and synamic sitting balance, tolerated ~15 mins      Cognition Arousal/Alertness: Awake/alert Behavior During Therapy: Anxious Overall Cognitive Status: Within Functional Limits for tasks assessed                                 General Comments: AOx4, reports feeling anxious re: mobility        Exercises Exercises: Other exercises;General Upper Extremity;General Lower Extremity General Exercises - Upper Extremity Shoulder Flexion: AROM;Strengthening;Both;20 reps;Seated;Theraband Theraband Level (Shoulder Flexion): Level 2 (Red) Shoulder Extension: AROM;Strengthening;Both;20 reps;Seated;Theraband Theraband Level (Shoulder Extension): Level 2 (Red) Elbow Flexion: AROM;Strengthening;Both;20 reps;Seated;Theraband Theraband Level (Elbow Flexion): Level 2 (Red) Elbow Extension: AROM;Strengthening;Both;20 reps;Seated;Theraband Theraband Level (Elbow Extension): Level 2 (Red) General Exercises - Lower Extremity Gluteal Sets: AROM;Strengthening;Both;20 reps;Seated Long Arc Quad: AROM;Strengthening;Both;20 reps;Seated Hip ABduction/ADduction: AROM;Strengthening;Both;20 reps;Seated Straight Leg Raises: AROM;Strengthening;Both;20 reps;Seated Hip Flexion/Marching: AROM;Strengthening;Both;20 reps;Seated Other Exercises Other Exercises: Pt educated re: OT role, DME recs, d/c recs, falls prevention, importance of mobility for funcitonal strengthening, HEP Other Exercises: sup<>sit, sitting balance/tolerance           Pertinent Vitals/ Pain       Pain Assessment: No/denies pain         Frequency  Min 1X/week        Progress Toward Goals  OT Goals(current goals can now be found in the care plan section)  Progress towards OT goals: Progressing toward goals  Acute Rehab OT Goals Patient Stated Goal: To feel better OT Goal Formulation: With patient Time For Goal Achievement: 06/20/21 Potential to Achieve Goals: Fair ADL  Goals Pt Will Perform Grooming: with modified independence;sitting Pt Will Perform Lower Body Dressing: with min assist;sitting/lateral leans Pt Will Transfer to Toilet: with min assist;squat pivot transfer;bedside commode Pt Will Perform Toileting - Clothing Manipulation and hygiene: with min assist;sit to/from stand  Plan Discharge plan remains appropriate;Frequency remains appropriate       AM-PAC OT "6 Clicks" Daily Activity     Outcome Measure   Help from another person eating meals?: None Help from another person taking care of personal grooming?: A Little Help from another person toileting, which includes using toliet, bedpan, or urinal?: A Lot Help from another person bathing (including washing, rinsing, drying)?: A Lot Help from another person to put on and taking off regular upper body clothing?: A Little Help from another person to put on and taking off regular lower body clothing?: A Lot 6 Click Score: 16    End of Session    OT Visit Diagnosis: Unsteadiness on feet (R26.81);Muscle weakness (generalized) (M62.81);Repeated falls (R29.6);History of falling (Z91.81)   Activity Tolerance Patient tolerated treatment well   Patient Left in bed;with call bell/phone within reach;with bed alarm set   Nurse Communication          Time: 1047-1110 OT Time Calculation (min): 23 min  Charges: OT General Charges $OT Visit: 1 Visit OT Treatments $Therapeutic Exercise: 23-37 mins  Dessie Coma, M.S. OTR/L  06/11/21, 12:30 PM  ascom [phone]

## 2021-06-11 NOTE — TOC Progression Note (Signed)
Transition of Care [facility]) - Progression Note    Patient Details  Name: Anita [name] MRN: [id number] Date of Birth: [birth year]  Transition of Care [facility]) CM/SW [place], RN Phone Number: 06/11/2021, 4:23 PM  Clinical Narrative:   Patient discharged.  [facility] confirmed they will provide Home Health for patient         Expected Discharge Plan and Services           Expected Discharge Date: 06/11/21                                     Social Determinants of Health ([place]) Interventions    Readmission Risk Interventions Readmission Risk Prevention Plan 06/06/2021  Transportation Screening Complete  PCP or Specialist Appt within 3-5 Days Complete  HRI or [place] Complete  Social Work Consult for [place] Planning/Counseling Not Complete  SW consult not completed comments RNCM assigned to case  Palliative Care Screening Not Applicable  Medication Review [employer]) Complete  Some recent data might be hidden

## 2021-06-11 NOTE — Progress Notes (Signed)
Progress Note  Patient Name: Anita [name] Date of Encounter: 06/11/2021  [facility] Cardiologist: Buford Dresser, MD   Subjective   Other than back pain, patient feels well.  She denies chest pain, shortness of breath, palpitations, lightheadedness, and edema.  Inpatient Medications    Scheduled Meds:  acidophilus  1 capsule Oral Daily   diclofenac Sodium  2 g Topical QID   feeding supplement  237 mL Oral BID BM   ferrous sulfate  325 mg Oral BID WC   loratadine  10 mg Oral Daily   midodrine  5 mg Oral TID WC   mirtazapine  15 mg Oral QHS   mometasone-formoterol  2 puff Inhalation BID   multivitamin with minerals  1 tablet Oral Daily   pantoprazole  40 mg Oral Daily   polyethylene glycol  17 g Oral BID   vitamin B-12  1,000 mcg Oral Daily   Continuous Infusions:  PRN Meds: albuterol, calcium carbonate, diclofenac Sodium, doxylamine (Sleep), guaiFENesin, morphine injection, nitroGLYCERIN, ondansetron **OR** ondansetron (ZOFRAN) IV, oxyCODONE, SUMAtriptan, traZODone   Vital Signs    Vitals:   06/10/21 2321 06/11/21 0540 06/11/21 0731 06/11/21 1134  BP: 104/63 108/77 106/77 117/76  Pulse: 91 78 79 90  Resp: 16 18 15 18   Temp: 99.3 F (37.4 C) 98 F (36.7 C) 98.4 F (36.9 C) 98.4 F (36.9 C)  TempSrc:   Oral   SpO2: 98% 100% 100% 100%  Weight:      Height:        Intake/Output Summary (Last 24 hours) at 06/11/2021 1338 Last data filed at 06/11/2021 0542 Gross per 24 hour  Intake --  Output 1350 ml  Net -1350 ml   Last 3 Weights 06/10/2021 06/09/2021 06/09/2021  Weight (lbs) 110 lb 3.7 oz 103 lb 14.4 oz 112 lb 7 oz  Weight (kg) 50 kg 47.129 kg 51 kg      Telemetry    Not currently on telemetry.  ECG    No new tracing.  Physical Exam   GEN: No acute distress.   Neck: JVP approximately 6 to 8 cm. Cardiac: RRR with 2/6 systolic murmur.  No rubs or gallops. Respiratory: Mildly diminished breath sounds throughout without wheezes or  crackles. GI: Soft, nontender, non-distended  MS: No edema; No deformity. Neuro:  Nonfocal  Psych: Normal affect   Labs    High Sensitivity Troponin:   Recent Labs  Lab 05/18/21 2205 05/18/21 2356  TROPONINIHS 20* 18*      Chemistry Recent Labs  Lab 06/09/21 0410 06/10/21 0403 06/11/21 0503  NA 135 136 136  K 3.8 3.9 4.3  CL 104 104 102  CO2 24 25 29   GLUCOSE 93 103* 95  BUN 28* 22* 20  CREATININE 1.22* 1.14* 1.13*  CALCIUM 8.7* 8.7* 8.8*  PROT 6.6 6.9 6.9  6.9  ALBUMIN 2.9* 3.0* 3.1*  3.1*  AST 127* 155* 177*  180*  ALT 119* 142* 172*  172*  ALKPHOS 261* 273* 245*  229*  BILITOT 0.5 0.7 0.7  0.7  GFRNONAA 51* 56* 56*  ANIONGAP 7 7 5      Hematology Recent Labs  Lab 06/09/21 0410 06/10/21 0403 06/11/21 0503  WBC 8.3 8.0 8.9  RBC 3.52* 3.45* 3.44*  HGB 9.7* 9.7* 9.5*  HCT 31.6* 30.1* 30.7*  MCV 89.8 87.2 89.2  MCH 27.6 28.1 27.6  MCHC 30.7 32.2 30.9  RDW 16.4* 16.4* 16.7*  PLT 327 334 360    BNPNo results for  input(s): BNP, PROBNP in the last 168 hours.   DDimer No results for input(s): DDIMER in the last 168 hours.   Radiology    No results found.  Cardiac Studies   TTE(02/07/2021): 1. Left ventricular ejection fraction, by estimation, is 40 to 45%. Left ventricular ejection fraction by 3D volume is 47 %. The left ventricle has mildly decreased function. The left ventricle demonstrates global hypokinesis. Left ventricular diastolic parameters are consistent with Grade II diastolic dysfunction (pseudonormalization). Elevated left ventricular [place]-diastolic pressure. 2. Right ventricular systolic function is severely reduced. The right ventricular size is mildly enlarged. There is severely elevated pulmonary artery systolic pressure. The estimated right ventricular systolic pressure is 07.3 mmHg. 3. Left atrial size was moderately dilated. 4. Right atrial size was severely dilated. 5. The mitral valve is degenerative with mild to moderate  thickening of the mitral valve leaflets. There is teathering of the MV leaflets due to mild LV dysfunction. Severe mitral valve regurgitation. Mild to moderate mitral stenosis. The mean mitral valve gradient is 6.5 mmHg. 6. Tricuspid valve regurgitation is moderate to severe. 7. The aortic valve is tricuspid. Aortic valve regurgitation is mild. Mild aortic valve sclerosis is present, with no evidence of aortic valve stenosis. Aortic regurgitation PHT measures 499 msec. 8. The inferior vena cava is dilated in size with <50% respiratory variability, suggesting right atrial pressure of 15 mmHg. 9. There is a trivial pericardial effusion posterior to the left ventricle. 10. Compared to prior echo, Mitral regurgitation appears worse and RV dysfunction is now present. There is severe Pulmonary HTN.  Patient Profile     60 y.o. female with history of medically managed multivessel CAD, chronic HFrEF due to ischemic cardiomyopathy complicated by moderate to severe mitral regurgitation, pulmonary hypertension, multiple sclerosis, COPD, anemia, prior tobacco use, and chronic shortness of breath, admitted with hypotension, acute kidney injury, and acute liver injury in the setting of diarrhea.  Assessment & Plan    Chronic HFrEF: Ms. Zwicker appears euvolemic today.  Blood pressures remain soft on midodrine and.  All goal-directed medical therapy for her cardiomyopathy is currently on hold.  She is also off diuretics.  She reports poor oral intake, which has been a longstanding issue for her. Given hypotension and recent AKI, I recommend holding all goal-directed medical therapy until the patient is seen in follow-up (beta-blocker, Entresto, spironolactone, and SGLT2 inhibitor). Defer standing diuretic.  It would be reasonable to have Ms. Pecina use torsemide 20 mg daily as needed for weight gain (2 pounds in 1 day or 5 pounds in 1 week) or obvious edema. Consider repeat echo as an outpatient to  reassess mitral regurgitation.  Patient may benefit from MitraClip if moderate to severe mitral regurgitation persists despite medical optimization.  Hypotension: Improved though blood pressure remains somewhat soft on midodrine 5 mg 3 times daily with meals. Continue midodrine for now, wean off as tolerated. Defer restarting carvedilol, Entresto, and diuretics.  Coronary artery disease: Patient with known multivessel CAD being managed medically.  She has not had any angina. Aspirin and statin on hold in the setting of possible melena, anemia, and acute liver injury.  Restart agents when possible on an outpatient basis.  Acute kidney injury: Likely driven primarily by dehydration in the setting of diarrhea and diuretic use confounded by Entresto.  Creatinine back to baseline. Maintain net even fluid status. Defer restarting Entresto and standing diuretics.   For questions or updates, please contact [place] Please consult [email or web address] for contact info under [facility]  Cardiology.     Signed, Nelva Bush, MD  06/11/2021, 1:38 PM

## 2021-06-11 NOTE — Discharge Summary (Signed)
Physician Discharge Summary  Anita [name] ZLD:[id number] DOB: 1961/01/01 DOA: 06/04/2021  PCP: [name], NP  Admit date: 06/04/2021 Discharge date: 06/11/2021  Time spent: 40 minutes  Recommendations for Outpatient Follow-up:  Follow outpatient CBC/CMP Follow blood pressure/volume status outpatient - HF and BP meds on hold at discharge, discharged on midodrine - discharged on torsemide as needed Follow LFT's outpatient 3-5x normal for several days here - unclear etiology - medications vs hemodynamically mediated (hypovolemic vs developing hepatic congestion? - no overt overload on exam) - workup additionally outpatient as indicated Dark stools noted - suspect this is due to her taking PO iron rather than hemodynamically significant GI bleed - follow outpatient  Follow heart failure, severe MR with cardiology outpatient Follow left upper retroperitoneal mass (ganglioneuroma) outpatient  Follow L para aortic lymph node outpatient  Needs mammogram outpatient with thickened skin of breasts bilaterally Follow weight loss outpatient  ASA and statin on hold, follow with cards for possible resumption with hx CAD  Discharge Diagnoses:  Active Problems:   AKI (acute kidney injury) ([place])   Elevated liver enzymes   Dehydration   Discharge Condition: stable  Diet recommendation: heart healthy   Filed Weights   06/09/21 0604 06/09/21 1218 06/10/21 0412  Weight: 51 kg 47.1 kg 50 kg    History of present illness:  Anita [name] is Anita [name] 60 y.o. Caucasian female with medical history significant for GERD, migraine, coronary artery disease, heart failure with reduced ejection fraction, COPD, GERD and multiple sclerosis, who presented to the emergency room with hypotension and diarrhea described as black, concerning for melena.  She was admitted with acute kidney injury and hypotension with concern for melena and diarrhea.  She did not have diarrhea or melanotic stools during  hospital stay, stools thought to be dark 2/2 iron use, hemoglobin also relatively stable making this less likely, recommending outpatient follow up.  Her hypotension/orthostatic hypotension was though 2/2 volume depletion and HF/BP meds started at prior admission.  All HF meds were held and she was started on midodrine.  Cardiology was consulted.  She was given gentle IVF and her BP gradually improved.  Therapy recommended SNF, but she declined this.  Plan for d/c home with daughter on 7/5.  Hospitalization complicated by elevated liver enzymes, potentially contributing meds were held with plan for outpatient follow up.   See below for additional details  Hospital Course:    # Orthostatic Hypotension Improved, discharge with midodrine Thigh high compression stockings (apparently is refusing these) Hold antihypertensives/heart failure meds Continue  midodrine and follow outpatient - wean midodrine as tolerated Appreciate cardiology assistance   # Severe Mitral Regurgitation  Heart Failure Given significant orthostasis, will c/s cardiology 6/30, appreciate assistance Not felt to be surgical/mitraclip candidate per cardiology HF meds on hold with hypotension above - per cards Will discharge with torsemide to use only as needed   1.  Acute kidney injury superimposed on stage IIIa chronic kidney disease - 2/2 diarrhea +/- dehydration with diuresis as well as HF meds - improving, follow - no hydro on CT scan - strict I/O, daily weights   # Constipation  Nausea - bowel regimen - zofran for nausea  # Diarrhea  Concern for melena - no diarrhea/stool since admission - unlikely infectious diarrhea or melena, cancel hemoccult and c diff - hb appears relatively stable, again, doubt hemodynamically significant GI bleed - she is on iron which could cause dark stool - continue PPI - follow outpatient with GI/PCP   #  Left Upper Retroperitoneal Mass  Prominent L Para Aortic Lymph Node 2007  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  Hypokalemia. - improved, follow   3.  Mild hyponatremia. - improved with IVF  4.  Elevated LFTs. - This could still be related to dehydration/shock liver with relative hypotension - acute hepatitis panel - crestor on hold - will continue to monitor - she's been getting tylenol relatively regularly as well, will hold.  Hold remeron on d/c as well.  Prilosec rarely associated?  Follow outpatient, consider adjustment. -- trend, follow - fluctuating, no clear underlying etiology - ? Medications vs hemodynamically mediated  -- RUQ [place] with dilated gallbladder, nonshadowing sludge balls --- if worsening, consider repeat outpatient (no signs of obstruction)  5.  Pyuria with rare bacteriuria and no urinary symptoms. - cx with multiple species, follow  6.  Heart failure with reduced ejection fraction, compensated. - Hold farxiga.  Continue to hold off Entresto and Aldactone given acute kidney injury and hypotension.  Hold coreg. -- Torsemide prn  7.  Dyslipidemia. - holding statin  8.  Coronary artery disease. - holding statin and ASA.  Follow with cards outpatient   9.  COPD. - We will continue her Advair Diskus.  10.  Multiple sclerosis. - at baseline has difficulty with strength, balance, bowel/bladder incontinence.  Notes she walks with walker.  Procedures: none  Consultations: cardiology  Discharge Exam: Vitals:   06/11/21 0731 06/11/21 1134  BP: 106/77 117/76  Pulse: 79 90  Resp: 15 18  Temp: 98.4 F (36.9 C) 98.4 F (36.9 C)  SpO2: 100% 100%   Eager to go home if possible Discussed  with daughter, d/c recommendations - they have appt Friday with PCP  General: No acute distress. Cardiovascular: RRR Lungs: unlabored Abdomen: Soft, nontender, nondistended  Neurological: Alert and oriented 3. Moves all extremities 4 . Cranial nerves II through XII grossly intact. Skin: Warm and dry. No rashes or lesions. Extremities: No clubbing or cyanosis. No edema  Discharge Instructions   Discharge Instructions     (HEART FAILURE PATIENTS) Call MD:  Anytime you have any of the following symptoms: 1) 3 pound weight gain in 24 hours or 5 pounds in 1 week 2) shortness of breath, with or without Maday Guarino dry hacking cough 3) swelling in the hands, feet or stomach 4) if you have to sleep on extra pillows at night in order to breathe.   Complete by: As directed    Call MD for:  difficulty breathing, headache or visual disturbances   Complete by: As directed    Call MD for:  extreme fatigue   Complete by: As directed    Call MD for:  hives   Complete by: As directed    Call MD for:  persistant dizziness or light-headedness   Complete by: As directed    Call MD for:  persistant nausea and vomiting   Complete by: As directed    Call MD for:  redness, tenderness, or signs of infection (pain, swelling, redness, odor or green/yellow discharge around incision site)   Complete by: As directed    Call MD for:  severe uncontrolled pain   Complete by: As directed    Call MD for:  temperature >100.4   Complete by: As directed    Diet - low sodium heart healthy   Complete by: As directed    Discharge instructions   Complete by: As directed    You were seen for low blood pressure.    We think this was due to your blood pressure and heart failure medicines in addition to dehydration (with possible contribution from diarrhea).  This has improved with IV fluids and Omar Gayden new medicine called midodrine.  Continue to hold your heart failure and blood pressure medicines until you follow up with cardiology  outpatient.  We will start you on torsemide to use as needed.  Do not take torsemide daily, but take only if you gain 2 lbs in Leonte Horrigan day or 5 lbs over the course of Zulema Pulaski week, or if you have obvious swelling.  Please let your cardiologist know if you're gaining weight or developing swelling.  There was concern for dark stool and diarrhea on presentation.  I don't think your dark stool represented Yeni Jiggetts GI bleed because your hemoglobin was stable and you did not have ongoing dark stools during the hospitalization (GI bleeds usually cause diarrhea and I'd expect you to have continued stools if you had Akasia Ahmad significant GI bleed).  I think the most likely thing is that your dark stool was related to your iron pills.  Please follow up with your PCP as an outpatient regarding this (consider GI follow up).  We've stopped your aspirin for now and continued you on prilosec.  Follow up with cardiology outpatient for your heart failure and your mitral regurgitation.  We're holding all your heart failure and blood pressure medicines at this time.  Your liver enzymes rose while you were here in the hospital.  They've been somewhere around 3-5 times normal for the past 3-4 days.  Often times, this is related to medications (low blood pressures can cause this at times as well), but there are many possible causes and I'm not quite sure of the cause of your liver enzyme abnormality.  We've stopped your tylenol and crestor.  Remeron has an association with elevated liver enzymes so we've stopped this as well.  Prilosec can sometimes be associated with elevated liver enzymes, we'll continue this for now, but it's something to evaluate in the future if not improving.  Your ultrasound showed sludge, but no other concerning findings and your liver enzymes don't show signs of obstruction right now.  Please follow up with your PCP Friday for repeat labs and consideration of additional workup if the LFT elevation is persistent.  Please follow up  the abnormal para aortic lymph node and the history of the ganglioneuroma with your PCP.  You need Cam Harnden mammogram outpatient.   You've lost notable weight.  Please follow up with your outpatient doctor for additional evaluation and treatment.   Return for new, recurrent, or worsening symptoms.  Please ask your PCP to request records from this hospitalization so they know what was done and what the next steps will be.   Increase activity slowly   Complete by: As directed       Allergies as of 06/11/2021       Reactions   Acthar Hp [corticotropin] Other (See Comments)   Throat swelling and coughing up blood   Codeine    Prednisone         Medication List  STOP taking these medications    acetaminophen 325 MG tablet Commonly known as: TYLENOL   aspirin 81 MG EC tablet   carvedilol 3.125 MG tablet Commonly known as: COREG   dapagliflozin propanediol 10 MG Tabs tablet Commonly known as: FARXIGA   Klor-Con M20 20 MEQ tablet Generic drug: potassium chloride SA   losartan 25 MG tablet Commonly known as: COZAAR   mirtazapine 15 MG tablet Commonly known as: Remeron   rosuvastatin 20 MG tablet Commonly known as: CRESTOR   sacubitril-valsartan 24-26 MG Commonly known as: ENTRESTO   spironolactone 25 MG tablet Commonly known as: ALDACTONE       TAKE these medications    albuterol 108 (90 Base) MCG/ACT inhaler Commonly known as: VENTOLIN HFA Inhale 1-2 puffs into the lungs every 6 (six) hours as needed for wheezing or shortness of breath. Use sparingly!   calcium carbonate 750 MG chewable tablet Commonly known as: TUMS EX Chew 1 tablet by mouth daily as needed for heartburn.   cetirizine 10 MG tablet Commonly known as: ZYRTEC Take 10 mg by mouth daily.   diclofenac Sodium 1 % Gel Commonly known as: VOLTAREN Apply 2 g topically 4 (four) times daily as needed (back or joint pain).   feeding supplement Liqd Take 237 mLs by mouth 2 (two) times daily  between meals.   ferrous sulfate 325 (65 FE) MG EC tablet TAKE 1 TABLET BY MOUTH 2 TIMES DAILY WITH Earnstine Meinders MEAL.   Fluticasone-Salmeterol 250-50 MCG/DOSE Aepb Commonly known as: Advair Diskus Inhale 1 puff into the lungs 2 (two) times daily.   guaiFENesin 600 MG 12 hr tablet Commonly known as: MUCINEX Take 600 mg by mouth 2 (two) times daily as needed for cough.   midodrine 5 MG tablet Commonly known as: PROAMATINE Take 1 tablet (5 mg total) by mouth 3 (three) times daily with meals.   multivitamin with minerals Tabs tablet Take 1 tablet by mouth daily.   nitroGLYCERIN 0.4 MG SL tablet Commonly known as: NITROSTAT Place 0.4 mg under the tongue every 5 (five) minutes as needed for chest pain.   omeprazole 40 MG capsule Commonly known as: PRILOSEC Take 1 capsule (40 mg total) by mouth daily.   Probiotic Advanced Caps Take 1 capsule by mouth daily.   SLEEP AID PO Take 1 tablet by mouth at bedtime as needed (sleep).   SUMAtriptan 25 MG tablet Commonly known as: IMITREX Take 1 tablet by mouth at migraine onset, may repeat in 2 hours if headache persists or recurs. Do not exceed 2 tablets in 24 hours.   torsemide 20 MG tablet Commonly known as: Demadex Take only as needed.  Take for weight gain of 2 lbs in 1 day or 5 lbs in 1 week or obvious swelling. What changed:  medication strength how much to take how to take this when to take this additional instructions   vitamin B-12 1000 MCG tablet Commonly known as: CYANOCOBALAMIN Take 1,000 mcg by mouth daily.       Allergies  Allergen Reactions   Acthar Hp [Corticotropin] Other (See Comments)    Throat swelling and coughing up blood   Codeine    Prednisone       The results of significant diagnostics from this hospitalization (including imaging, microbiology, ancillary and laboratory) are listed below for reference.    Significant Diagnostic Studies: CT ABDOMEN PELVIS WO CONTRAST  Result Date: 06/04/2021 CLINICAL  DATA:  Abdominal pain, acute, nonlocalized EXAM: CT ABDOMEN AND PELVIS WITHOUT CONTRAST TECHNIQUE:  Multidetector CT imaging of the abdomen and pelvis was performed following the standard protocol without IV contrast. COMPARISON:  CT abdomen 03/15/2006, CT chest 03/20/2020 FINDINGS: Lower chest: Scarring/linear atelectasis the left lung base. Reticular scarring in the peripheral left lower lobe. Partially visualized bilateral skin thickening in the breasts bilaterally. Hepatobiliary: No focal liver abnormality is seen. Mildly distended gallbladder, without adjacent inflammatory change. Pancreas: Unremarkable. No pancreatic ductal dilatation or surrounding inflammatory changes. Spleen: Normal in size without focal abnormality. Adrenals/Urinary Tract: Again seen is Isamu Trammel 5.6 x 4.3 x 3.1 cm left upper retroperitoneal mass (coronal image 42, axial image 28), adjacent to retroperitoneal surgical clips. The right adrenal gland is unremarkable. No hydronephrosis. Postsurgical changes to the upper pole the left kidney. The bladder is mildly distended but unremarkable. Stomach/Bowel: Distended stomach full of ingested contents. There is no evidence of bowel obstruction. The appendix is normal. Scattered colonic diverticuli. No evidence of diverticulitis. Vascular/Lymphatic: Aortic atherosclerotic calcifications. No AAA. Enlarged left periaortic lymph node measuring 1.2 cm (axial image 39). Reproductive: Unremarkable. Other: Trace perihepatic, perisplenic, and pelvic ascites. Small ventral hernia containing Nayana Lenig single wall of the transverse colon. Musculoskeletal: Mild body wall edema. No acute osseous abnormality. Multilevel degenerative disc disease, worst at L4-L5 and T10-T11. IMPRESSION: Left upper retroperitoneal mass measuring 5.6 cm, adjacent to retroperitoneal surgical clips, similar in appearance to prior chest CT in April 2021. Patient had Lanett Lasorsa mass in this location in 2007, reportedly Marvelene Stoneberg ganglioneuroma. Prominent left  para-aortic lymph node. Correlate with surgical history and any recent surveillance imaging if performed at another institution. Small volume of abdominopelvic ascites. No other acute abnormality in the abdomen or pelvis. Partially visualized skin thickening of the breasts bilaterally, correlate with mammography. Electronically Signed   By: Maurine Simmering   On: 06/04/2021 21:20   US Venous Img Upper Uni Left  Result Date: 05/18/2021 CLINICAL DATA:  Edema EXAM: LEFT UPPER EXTREMITY VENOUS DOPPLER ULTRASOUND TECHNIQUE: Gray-scale sonography with graded compression, as well as color Doppler and duplex ultrasound were performed to evaluate the upper extremity deep venous system from the level of the subclavian vein and including the jugular, axillary, basilic, radial, ulnar and upper cephalic vein. Spectral Doppler was utilized to evaluate flow at rest and with distal augmentation maneuvers. COMPARISON:  None. FINDINGS: Contralateral Subclavian Vein: Respiratory phasicity is normal and symmetric with the symptomatic side. No evidence of thrombus. Normal compressibility. Internal Jugular Vein: No evidence of thrombus. Normal compressibility, respiratory phasicity and response to augmentation. Subclavian Vein: No evidence of thrombus. Normal compressibility, respiratory phasicity and response to augmentation. Axillary Vein: No evidence of thrombus. Normal compressibility, respiratory phasicity and response to augmentation. Cephalic Vein: No evidence of thrombus. Normal compressibility, respiratory phasicity and response to augmentation. Basilic Vein: No evidence of thrombus. Normal compressibility, respiratory phasicity and response to augmentation. Brachial Veins: No evidence of thrombus. Normal compressibility, respiratory phasicity and response to augmentation. Radial Veins: No evidence of thrombus. Normal compressibility, respiratory phasicity and response to augmentation. Ulnar Veins: No evidence of thrombus. Normal  compressibility, respiratory phasicity and response to augmentation. Venous Reflux:  None visualized. Other Findings:  None visualized. IMPRESSION: No evidence of DVT within the left upper extremity. Diffuse subcutaneous edema. Electronically Signed   By: Dorise Bullion III M.D   On: 05/18/2021 23:58   DG Chest Port 1 View  Result Date: 05/18/2021 CLINICAL DATA:  Shortness of breath EXAM: PORTABLE CHEST 1 VIEW COMPARISON:  01/08/2021 FINDINGS: Cardiomegaly. Bilateral airspace disease, right greater than left. Probable layering effusions. No acute  bony abnormality. IMPRESSION: Bilateral airspace disease with cardiomegaly and probable layering effusions. Findings could reflect edema or infection. Electronically Signed   By: Rolm Baptise M.D.   On: 05/18/2021 22:03   DG Abdomen Acute W/Chest  Result Date: 06/04/2021 CLINICAL DATA:  Rectal bleeding. EXAM: DG ABDOMEN ACUTE WITH 1 VIEW CHEST COMPARISON:  Chest 05/18/2021 FINDINGS: Frontal chest x-ray shows lordotic positioning. Cardiopericardial silhouette is at upper limits of normal for size. The lungs are clear without focal pneumonia, edema, pneumothorax or pleural effusion. The visualized bony structures of the thorax show no acute abnormality. Telemetry leads overlie the chest. Upright film shows no evidence for intraperitoneal free air. Mild gaseous distention of colon in the right pelvis, likely ascending segment. There is no evidence for gaseous bowel dilation to suggest obstruction. Lumbar spine and bony pelvis appear intact. IMPRESSION: No active cardiopulmonary disease. No evidence for bowel perforation or overt obstruction. Electronically Signed   By: Misty Stanley M.D.   On: 06/04/2021 14:50   US Abdomen Limited RUQ (LIVER/GB)  Result Date: 06/04/2021 CLINICAL DATA:  Elevated liver enzymes EXAM: ULTRASOUND ABDOMEN LIMITED RIGHT UPPER QUADRANT COMPARISON:  CT 03/20/2020, 03/19/2016, CT 03/16/2016 FINDINGS: Gallbladder: Dilated gallbladder  containing nonshadowing balls of sludge, largest measuring 1.7 x 1.7 cm. There is no wall thickening or pericholecystic fluid. Negative sonographic Murphy sign. Common bile duct: Diameter: 4.3 mm Liver: No focal lesion identified. Within normal limits in parenchymal echogenicity. Portal vein is patent on color Doppler imaging with normal direction of blood flow towards the liver. Other: None. IMPRESSION: Dilated gallbladder containing nonshadowing sludge balls, largest measuring 1.7 cm. No wall thickening or pericholecystic fluid. Negative sonographic Murphy sign. Correlate with labs and physical exam. Electronically Signed   By: Maurine Simmering   On: 06/04/2021 17:28    Microbiology: Recent Results (from the past 240 hour(s))  Urine Culture     Status: Abnormal   Collection Time: 06/04/21  2:56 PM   Specimen: Urine, Random  Result Value Ref Range Status   Specimen Description   Final    URINE, RANDOM Performed at [facility], [address] [place], [place] [address]    Special Requests   Final    NONE Performed at [facility], [address] [place], [place] [address]    Culture MULTIPLE SPECIES PRESENT, SUGGEST RECOLLECTION (Robbin Escher)  Final   Report Status 06/06/2021 FINAL  Final  Resp Panel by RT-PCR (Flu Blondie Riggsbee&B, Covid) Nasopharyngeal Swab     Status: None   Collection Time: 06/04/21  8:23 PM   Specimen: Nasopharyngeal Swab; Nasopharyngeal(NP) swabs in vial transport medium  Result Value Ref Range Status   SARS Coronavirus 2 by RT PCR NEGATIVE NEGATIVE Final    Comment: (NOTE) SARS-CoV-2 target nucleic acids are NOT DETECTED.  The SARS-CoV-2 RNA is generally detectable in upper respiratory specimens during the acute phase of infection. The lowest concentration of SARS-CoV-2 viral copies this assay can detect is 138 copies/mL. Jay Kempe negative result does not preclude SARS-Cov-2 infection and should not be used as the sole basis for treatment or other patient management  decisions. Kyian Obst negative result may occur with  improper specimen collection/handling, submission of specimen other than nasopharyngeal swab, presence of viral mutation(s) within the areas targeted by this assay, and inadequate number of viral copies(<138 copies/mL). Kermit Arnette negative result must be combined with clinical observations, patient history, and epidemiological information. The expected result is Negative.  Fact Sheet for Patients:  EntrepreneurPulse.com.au  Fact Sheet for Healthcare Providers:  IncredibleEmployment.be  This test is no t yet approved or cleared by the [place] and  has been authorized for detection and/or diagnosis of SARS-CoV-2 by FDA under an Emergency Use Authorization (EUA). This EUA will remain  in effect (meaning this test can be used) for the duration of the COVID-19 declaration under Section 564(b)(1) of the Act, 21 U.S.C.section 360bbb-3(b)(1), unless the authorization is terminated  or revoked sooner.       Influenza Odel Schmid by PCR NEGATIVE NEGATIVE Final   Influenza B by PCR NEGATIVE NEGATIVE Final    Comment: (NOTE) The Xpert Xpress SARS-CoV-2/FLU/RSV plus assay is intended as an aid in the diagnosis of influenza from Nasopharyngeal swab specimens and should not be used as Albertha Beattie sole basis for treatment. Nasal washings and aspirates are unacceptable for Xpert Xpress SARS-CoV-2/FLU/RSV testing.  Fact Sheet for Patients: EntrepreneurPulse.com.au  Fact Sheet for Healthcare Providers: IncredibleEmployment.be  This test is not yet approved or cleared by the [place] FDA and has been authorized for detection and/or diagnosis of SARS-CoV-2 by FDA under an Emergency Use Authorization (EUA). This EUA will remain in effect (meaning this test can be used) for the duration of the COVID-19 declaration under Section 564(b)(1) of the Act, 21 U.S.C. section 360bbb-3(b)(1), unless the  authorization is terminated or revoked.  Performed at [facility], [address] [place], [place] [address]      Labs: Basic Metabolic Panel: Recent Labs  Lab 06/06/21 0417 06/07/21 0335 06/08/21 0516 06/09/21 0410 06/10/21 0403 06/11/21 0503  NA 133* 133* 135 135 136 136  K 4.0 4.0 4.0 3.8 3.9 4.3  CL 102 101 102 104 104 102  CO2 26 25 24 24 25 29   GLUCOSE 84 95 81 93 103* 95  BUN 41* 37* 35* 28* 22* 20  CREATININE 1.71* 1.58* 1.54* 1.22* 1.14* 1.13*  CALCIUM 9.0 8.7* 8.8* 8.7* 8.7* 8.8*  MG 1.8 1.7 1.9 1.8 1.7 1.7  PHOS 3.5 3.4  --  3.1 3.4 3.6   Liver Function Tests: Recent Labs  Lab 06/07/21 0335 06/08/21 0516 06/09/21 0410 06/10/21 0403 06/11/21 0503  AST 42* 148* 127* 155* 177*  180*  ALT 39 134* 119* 142* 172*  172*  ALKPHOS 142* 278* 261* 273* 245*  229*  BILITOT 0.7 0.8 0.5 0.7 0.7  0.7  PROT 6.7 6.7 6.6 6.9 6.9  6.9  ALBUMIN 3.1* 3.0* 2.9* 3.0* 3.1*  3.1*   No results for input(s): LIPASE, AMYLASE in the last 168 hours. No results for input(s): AMMONIA in the last 168 hours. CBC: Recent Labs  Lab 06/07/21 0335 06/08/21 0516 06/09/21 0410 06/10/21 0403 06/11/21 0503  WBC 7.7 8.9 8.3 8.0 8.9  NEUTROABS 5.6 6.7 5.9 5.6 6.5  HGB 10.0* 10.0* 9.7* 9.7* 9.5*  HCT 30.3* 31.2* 31.6* 30.1* 30.7*  MCV 84.9 88.9 89.8 87.2 89.2  PLT 250 264 327 334 360   Cardiac Enzymes: No results for input(s): CKTOTAL, CKMB, CKMBINDEX, TROPONINI in the last 168 hours. BNP: BNP (last 3 results) Recent Labs    05/18/21 2205 06/04/21 1220  BNP 4,147.9* 930.0*    ProBNP (last 3 results) No results for input(s): PROBNP in the last 8760 hours.  CBG: No results for input(s): GLUCAP in the last 168 hours.     Signed:  Fayrene Helper MD.  [facility] 06/11/2021, 2:14 PM

## 2021-06-11 NOTE — TOC Progression Note (Addendum)
Transition of Care [facility]) - Progression Note    Patient Details  Name: Anita [name] MRN: [id number] Date of Birth: Feb 12, 1961  Transition of Care [facility]) CM/SW Leslie, RN Phone Number: 06/11/2021, 4:28 PM  Clinical Narrative:   Patient is discharging today, [facility] home [facility] confirmed by Malachy Mood will be accepting care in home health for patient.  Patient and family continue to refuls SNF.  Patient states she has a walker at home.  No further questions posed by patient.         Expected Discharge Plan and Services           Expected Discharge Date: 06/11/21                                     Social Determinants of Health (SDOH) Interventions    Readmission Risk Interventions Readmission Risk Prevention Plan 06/06/2021  Transportation Screening Complete  PCP or Specialist Appt within 3-5 Days Complete  HRI or [place] Complete  Social Work Consult for [place] Planning/Counseling Not Complete  SW consult not completed comments RNCM assigned to case  Palliative Care Screening Not Applicable  Medication Review Press photographer) Complete  Some recent data might be hidden

## 2021-06-13 DIAGNOSIS — I110 Hypertensive heart disease with heart failure: Secondary | ICD-10-CM | POA: Diagnosis not present

## 2021-06-13 DIAGNOSIS — I5043 Acute on chronic combined systolic (congestive) and diastolic (congestive) heart failure: Secondary | ICD-10-CM | POA: Diagnosis not present

## 2021-06-13 DIAGNOSIS — N390 Urinary tract infection, site not specified: Secondary | ICD-10-CM | POA: Diagnosis not present

## 2021-06-13 DIAGNOSIS — I255 Ischemic cardiomyopathy: Secondary | ICD-10-CM | POA: Diagnosis not present

## 2021-06-13 DIAGNOSIS — I272 Pulmonary hypertension, unspecified: Secondary | ICD-10-CM | POA: Diagnosis not present

## 2021-06-13 DIAGNOSIS — J9601 Acute respiratory failure with hypoxia: Secondary | ICD-10-CM | POA: Diagnosis not present

## 2021-06-14 ENCOUNTER — Ambulatory Visit (INDEPENDENT_AMBULATORY_CARE_PROVIDER_SITE_OTHER): Payer: Medicare Other | Admitting: Primary Care

## 2021-06-14 ENCOUNTER — Other Ambulatory Visit: Payer: Self-pay

## 2021-06-14 ENCOUNTER — Encounter: Payer: Self-pay | Admitting: Primary Care

## 2021-06-14 VITALS — BP 108/66 | HR 86 | Temp 97.2°F | Ht 63.0 in | Wt 110.0 lb

## 2021-06-14 DIAGNOSIS — R748 Abnormal levels of other serum enzymes: Secondary | ICD-10-CM | POA: Diagnosis not present

## 2021-06-14 DIAGNOSIS — R7989 Other specified abnormal findings of blood chemistry: Secondary | ICD-10-CM | POA: Diagnosis not present

## 2021-06-14 DIAGNOSIS — G35 Multiple sclerosis: Secondary | ICD-10-CM | POA: Diagnosis not present

## 2021-06-14 DIAGNOSIS — M545 Low back pain, unspecified: Secondary | ICD-10-CM

## 2021-06-14 DIAGNOSIS — I255 Ischemic cardiomyopathy: Secondary | ICD-10-CM | POA: Diagnosis not present

## 2021-06-14 DIAGNOSIS — Z1231 Encounter for screening mammogram for malignant neoplasm of breast: Secondary | ICD-10-CM | POA: Diagnosis not present

## 2021-06-14 DIAGNOSIS — J439 Emphysema, unspecified: Secondary | ICD-10-CM | POA: Diagnosis not present

## 2021-06-14 DIAGNOSIS — I5043 Acute on chronic combined systolic (congestive) and diastolic (congestive) heart failure: Secondary | ICD-10-CM | POA: Diagnosis not present

## 2021-06-14 DIAGNOSIS — R1900 Intra-abdominal and pelvic swelling, mass and lump, unspecified site: Secondary | ICD-10-CM

## 2021-06-14 DIAGNOSIS — I340 Nonrheumatic mitral (valve) insufficiency: Secondary | ICD-10-CM

## 2021-06-14 DIAGNOSIS — I10 Essential (primary) hypertension: Secondary | ICD-10-CM

## 2021-06-14 LAB — COMPREHENSIVE METABOLIC PANEL
AG Ratio: 1.3 (calc) (ref 1.0–2.5)
ALT: 88 U/L — ABNORMAL HIGH (ref 6–29)
AST: 54 U/L — ABNORMAL HIGH (ref 10–35)
Albumin: 3.7 g/dL (ref 3.6–5.1)
Alkaline phosphatase (APISO): 197 U/L — ABNORMAL HIGH (ref 37–153)
BUN/Creatinine Ratio: 15 (calc) (ref 6–22)
BUN: 17 mg/dL (ref 7–25)
CO2: 26 mmol/L (ref 20–32)
Calcium: 9 mg/dL (ref 8.6–10.4)
Chloride: 102 mmol/L (ref 98–110)
Creat: 1.13 mg/dL — ABNORMAL HIGH (ref 0.50–1.05)
Globulin: 2.9 g/dL (calc) (ref 1.9–3.7)
Glucose, Bld: 87 mg/dL (ref 65–99)
Potassium: 4.6 mmol/L (ref 3.5–5.3)
Sodium: 139 mmol/L (ref 135–146)
Total Bilirubin: 0.6 mg/dL (ref 0.2–1.2)
Total Protein: 6.6 g/dL (ref 6.1–8.1)

## 2021-06-14 LAB — CBC
HCT: 32.3 % — ABNORMAL LOW (ref 35.0–45.0)
Hemoglobin: 10.1 g/dL — ABNORMAL LOW (ref 11.7–15.5)
MCH: 27.3 pg (ref 27.0–33.0)
MCHC: 31.3 g/dL — ABNORMAL LOW (ref 32.0–36.0)
MCV: 87.3 fL (ref 80.0–100.0)
MPV: 10 fL (ref 7.5–12.5)
Platelets: 370 10*3/uL (ref 140–400)
RBC: 3.7 10*6/uL — ABNORMAL LOW (ref 3.80–5.10)
RDW: 15.2 % — ABNORMAL HIGH (ref 11.0–15.0)
WBC: 9.3 10*3/uL (ref 3.8–10.8)

## 2021-06-14 MED ORDER — ALBUTEROL SULFATE HFA 108 (90 BASE) MCG/ACT IN AERS: 1.0000 | INHALATION_SPRAY | Freq: Four times a day (QID) | RESPIRATORY_TRACT | 0 refills | Status: DC | PRN

## 2021-06-14 NOTE — Progress Notes (Signed)
Subjective:    Patient ID: Anita [name], female    DOB: 07/17/1961, 60 y.o.   MRN: [id number]  HPI  Anita [name] is a very pleasant 60 y.o. female with a significant medical history including multiple sclerosis, pulmonary hypertension, ischemic cardiomyopathy, COPD, CHF, hypotension, severe mitral valve regurgitation who presents today for hospital follow-up.  Admitted to [facility] on 05/18/2021 through 05/28/2021 for anasarca, acute on chronic CHF, hypoxia.  Over admission had diuresed approximately 25 L.  She was initiated on torsemide 40 mg daily, Entresto, Farxiga, spironolactone.  Recommendations were to recover at SNF, however patient and daughter refused and preferred patient to recover at home.  Readmitted to [facility] on 06/04/2021 through 06/11/2021 for acute kidney injury, elevated liver enzymes, dehydration.  She presented to the emergency department with black stools and hypotension.  Further work-up in the hospital did not find evidence of melena in the stools, she was hypotensive/orthostatic that was secondary to volume depletion and heart failure/blood pressure medications initiated during the previous admission.  Cardiology recommended discontinuation of Entresto, Farxiga, spironolactone, carvedilol.  She was advised to use torsemide as needed.  She was prescribed midodrine to take 3 times daily and recommended to see the heart failure clinic, cardiology, PCP in the outpatient setting.  Unfortunately she is not a candidate for surgical mitral clip procedure for her severe mitral regurgitation.  She was noted to have elevated liver enzymes without a known cause.  Since her discharge home she is doing much better.  She has an appointment scheduled with her cardiologist for late July 2022, and will see the heart failure clinic next week. She denies peripheral edema or abdominal swelling. She is not weighing herself since coming home from the  hospital, but her daughter has purchased a new scale which is set up and ready. She is compliant to her midodrine 5 mg TID. She has an appointment with the heart failure clinic next week.   She has been visited by home health physical therapy twice. There will be a home health nurse coming soon.   She continues to take oral iron twice daily. She denies dark/tarry stools.   Chronic left upper retroperitoneal mass, recent CT abdomen/pelvis shows 5.6 cm size which was unchanged from April 2021.  Per patient and daughter this was evaluated in 2007, surgical removal was attempted, not all of the mass was removed. They were told this mass was benign that required no further work-up.  She's having difficulty falling asleep, was provided with "sleeping pills" in the hospital which helped.  She's also struggling with lower back pain and hip pain.  She is sitting mostly as she cannot ambulate much.  Denies radiation of pain, increased numbness/tingling. She typically takes Tylenol for pain but cannot take this now due to elevated liver enzymes.   She's doing well in terms of COPD and breathing. She does not use her Advair inhaler, daughter did notice an improvement in her overall breathing when she was compliant.  She is not required use of her albuterol inhaler.  Her shortness of breath has significantly improved since her hospital stay.    She has not seen a neurologist in years for follow-up of her multiple sclerosis.  She was once following with [facility], but was unhappy with her care there.  She would be willing to follow-up with someone locally.  BP Readings from Last 3 Encounters:  06/14/21 108/66  06/11/21 103/65  05/31/21 104/60  Review of Systems  Constitutional:  Negative for fever.  Respiratory:  Negative for cough and shortness of breath.   Cardiovascular:  Negative for leg swelling.  Musculoskeletal:  Positive for back pain.  Neurological:  Negative for  dizziness.  Psychiatric/Behavioral:  Positive for sleep disturbance.         Past Medical History:  Diagnosis Date   Abnormality of gait 07/26/2013   Acute respiratory failure with hypoxia ([place]) 03/20/2020   Allergy    Arthritis    Atypical pneumonia 03/22/2020   CAD (coronary artery disease)    a. 03/2020 Cath: LM nl, LAD 60/72m, 55d, D1 70, D2 70, LCX 45ost/p, 65p, RCA 100ost CTO. RPDA fills via collats from dLAD. Inf septal fills via collats from 1st septal, RPAV fills via collats from LPAV, RPL1/2 sev dzs-->med rx.   Chickenpox    Chronic combined systolic (congestive) and diastolic (congestive) heart failure ([place])    a. 02/2021 Echo: EF 40-45%, glob HK, gr2 DD. Sev red RV fxn, RVSP 69.5mmHg. Mod dil LA, sev dil RA. Sev MR. Mild to mod MS. Mean MV grad 6.65mmHg. Mod-Sev TR. Mild AI. Mild Ao sclerosis w/o stenosis.   COPD (chronic obstructive pulmonary disease) (HCC)    Elevated troponin 03/22/2020   GERD (gastroesophageal reflux disease)    Headache(784.0)    Migraine   Hearing loss    Right ear secondary to infection   History of shingles    Ischemic cardiomyopathy    a. 02/2021 Echo: EF 40-45%, glob HK.   Migraines    Multiple sclerosis (HCC)    Obese    Optic neuritis    PAH (pulmonary artery hypertension) ([place])    a. 02/2021 Echo: RVSP 69.79mmHg.   Prolonged QT interval 03/20/2020   Severe mitral regurgitation    a. 02/2021 Echo: Severe MR w/ mild to mod MS.  Mean MV grad 6.40mmHg.    Social History   Socioeconomic History   Marital status: Divorced    Spouse name: Not on file   Number of children: Not on file   Years of education: Not on file   Highest education level: Not on file  Occupational History   Not on file  Tobacco Use   Smoking status: Former    Packs/day: 1.00    Pack years: 0.00    Types: Cigarettes   Smokeless tobacco: Never  Vaping Use   Vaping Use: Never used  Substance and Sexual Activity   Alcohol use: Yes    Comment: Occasional   Drug  use: Not on file   Sexual activity: Not on file  Other Topics Concern   Not on file  Social History Narrative   Not on file   Social Determinants of Health   Financial Resource Strain: Not on file  Food Insecurity: Not on file  Transportation Needs: Not on file  Physical Activity: Not on file  Stress: Not on file  Social Connections: Not on file  Intimate Partner Violence: Not on file    Past Surgical History:  Procedure Laterality Date   COLONOSCOPY WITH PROPOFOL N/A 08/03/2020   Procedure: COLONOSCOPY WITH PROPOFOL;  Surgeon: [name], MD;  Location: [facility] ENDOSCOPY;  Service: Gastroenterology;  Laterality: N/A;   Ganglioneuroma     Resection   LEFT HEART CATH AND CORONARY ANGIOGRAPHY N/A 03/23/2020   Procedure: LEFT HEART CATH AND CORONARY ANGIOGRAPHY;  Surgeon: Leonie Man, MD;  Location: [facility];  Service: Cardiovascular;  Laterality: N/A;   PILONIDAL CYST  EXCISION     PILONIDAL CYST EXCISION  1983   TUMOR REMOVAL  2007/2008    Family History  Problem Relation Age of Onset   Skin cancer Mother    Lung cancer Father    Uterine cancer Sister    Heart disease Brother    Bipolar disorder Sister    Throat cancer Paternal Grandmother     Allergies  Allergen Reactions   Acthar Hp [Corticotropin] Other (See Comments)    Throat swelling and coughing up blood   Codeine    Prednisone     Current Outpatient Medications on File Prior to Visit  Medication Sig Dispense Refill   albuterol (VENTOLIN HFA) 108 (90 Base) MCG/ACT inhaler Inhale 1-2 puffs into the lungs every 6 (six) hours as needed for wheezing or shortness of breath. Use sparingly! 18 each 0   calcium carbonate (TUMS EX) 750 MG chewable tablet Chew 1 tablet by mouth daily as needed for heartburn.     cetirizine (ZYRTEC) 10 MG tablet Take 10 mg by mouth daily.     diclofenac Sodium (VOLTAREN) 1 % GEL Apply 2 g topically 4 (four) times daily as needed (back or joint pain). 50 g 1   Doxylamine  Succinate, Sleep, (SLEEP AID PO) Take 1 tablet by mouth at bedtime as needed (sleep).      feeding supplement, ENSURE ENLIVE, (ENSURE ENLIVE) LIQD Take 237 mLs by mouth 2 (two) times daily between meals.     ferrous sulfate 325 (65 FE) MG EC tablet TAKE 1 TABLET BY MOUTH 2 TIMES DAILY WITH A MEAL. 180 tablet 1   Fluticasone-Salmeterol (ADVAIR DISKUS) 250-50 MCG/DOSE AEPB Inhale 1 puff into the lungs 2 (two) times daily. 1 each 3   guaiFENesin (MUCINEX) 600 MG 12 hr tablet Take 600 mg by mouth 2 (two) times daily as needed for cough.     midodrine (PROAMATINE) 5 MG tablet Take 1 tablet (5 mg total) by mouth 3 (three) times daily with meals. 90 tablet 0   Multiple Vitamin (MULTIVITAMIN WITH MINERALS) TABS tablet Take 1 tablet by mouth daily.     nitroGLYCERIN (NITROSTAT) 0.4 MG SL tablet Place 0.4 mg under the tongue every 5 (five) minutes as needed for chest pain.     omeprazole (PRILOSEC) 40 MG capsule Take 1 capsule (40 mg total) by mouth daily. 90 capsule 3   Probiotic Product (PROBIOTIC ADVANCED) CAPS Take 1 capsule by mouth daily.     SUMAtriptan (IMITREX) 25 MG tablet Take 1 tablet by mouth at migraine onset, may repeat in 2 hours if headache persists or recurs. Do not exceed 2 tablets in 24 hours. 10 tablet 0   torsemide (DEMADEX) 20 MG tablet Take only as needed.  Take for weight gain of 2 lbs in 1 day or 5 lbs in 1 week or obvious swelling. 30 tablet 0   vitamin B-12 (CYANOCOBALAMIN) 1000 MCG tablet Take 1,000 mcg by mouth daily.     No current facility-administered medications on file prior to visit.    BP 108/66   Pulse 86   Temp (!) 97.2 F (36.2 C) (Temporal)   Ht 5\' 3"  (1.6 m)   Wt 110 lb (49.9 kg)   SpO2 100%   BMI 19.49 kg/m  Objective:   Physical Exam Constitutional:      Appearance: She is not ill-appearing.  Cardiovascular:     Rate and Rhythm: Normal rate and regular rhythm.     Heart sounds: Murmur heard.  Comments: No peripheral edema noted. Appears  euvolemic Pulmonary:     Effort: Pulmonary effort is normal.     Breath sounds: Normal breath sounds. No rhonchi or rales.  Abdominal:     General: Abdomen is flat. Bowel sounds are normal.     Palpations: Abdomen is soft.     Tenderness: There is no abdominal tenderness.  Skin:    General: Skin is warm and dry.     Findings: No erythema.  Neurological:     Mental Status: She is alert and oriented to person, place, and time.  Psychiatric:        Mood and Affect: Mood normal.          Assessment & Plan:      This visit occurred during the SARS-CoV-2 public health emergency.  Safety protocols were in place, including screening questions prior to the visit, additional usage of staff PPE, and extensive cleaning of exam room while observing appropriate contact time as indicated for disinfecting solutions.

## 2021-06-14 NOTE — Assessment & Plan Note (Signed)
Since hospital stay and likely secondary to her sedentary lifestyle.  Will have daughter reach out to physical therapy to see if they can work with her and her back pain.  Hold Tylenol for now given elevated LFTs, may resume if liver enzymes improve.

## 2021-06-14 NOTE — Assessment & Plan Note (Signed)
Appears stable compared to CT scan from April 2021.  She and her daughter to continue to decline further follow-up/management of this.

## 2021-06-14 NOTE — Assessment & Plan Note (Signed)
No recent neurology visit, however, she is agreeable to see someone else locally for follow-up.  Referral placed to neurology locally for evaluation.

## 2021-06-14 NOTE — Assessment & Plan Note (Addendum)
Unclear etiology, perhaps secondary dehydration/liver shock?  Hepatitis work-up negative.  Repeat LFTs pending.

## 2021-06-14 NOTE — Assessment & Plan Note (Signed)
Not a surgical candidate, plan is to manage medically.  Following with heart failure clinic and cardiology. Continue torsemide as needed.

## 2021-06-14 NOTE — Patient Instructions (Addendum)
Stop by the lab prior to leaving today. I will notify you of your results once received.   You will be contacted regarding your referral to neurology.  Please let us know if you have not been contacted within two weeks.   Ask the physical therapist if he can work with your back pain.   Use the albuterol (rescue) inhaler only if needed for wheezing and shortness of breath. Resume the Advair (purple inhaler) if you require use of the albuterol more than three times weekly.   It was a pleasure to see you today!

## 2021-06-14 NOTE — Assessment & Plan Note (Signed)
Recent hospital admission in June, 25 L diuresed.  Unfortunately could not tolerate Entresto, spironolactone, carvedilol all of which were removed.  Continue torsemide as needed. Follow-up with the heart failure clinic as scheduled. She appears euvolemic today.

## 2021-06-14 NOTE — Assessment & Plan Note (Signed)
Stable.  Continue midodrine 5 mg 3 times daily. No longer on antihypertensive treatment.

## 2021-06-14 NOTE — Assessment & Plan Note (Signed)
Seems to be doing well, no recent use of Advair or albuterol inhaler.  Lungs clear on exam today.  Suspect prior dyspnea to be secondary to heart failure rather than COPD.  Refill provided for albuterol inhaler, discussed to use sparingly if needed and to resume Advair if she requires use of her albuterol inhaler more than 3 times weekly

## 2021-06-17 NOTE — Progress Notes (Signed)
Patient ID: Anita [name], female    DOB: Aug 26, 1961, 60 y.o.   MRN: [id number]  HPI  Anita [name] is a 60 y/o female with a history of CAD, COPD, GERD, multiple sclerosis, pulmonary HTN, severe valvular disease, previous tobacco use and chronic heart failure.   Echo report from 02/07/21 reviewed and showed an EF of 40-45% along with severely elevated PA pressure of 69.2 mmHg, moderate LAE, severe MR, mild/ moderate Anita and moderate/severe TR.   LHC done 03/23/20 showed: Hemodynamics: LV end diastolic pressure is moderately elevated. ----Coronary Angiography----- Ost RCA to Dist RCA lesion is 100% stenosed.-The PDA and PL system fills via faint collaterals from the AV groove LCx and LAD septals. Mid LAD-1 lesion is 60% stenosed. Mid LAD-2 lesion is 50% stenosed. Dist LAD lesion is 55% stenosed with 70% stenosed side branch in 2nd Diag. 1st Diag lesion is 70% stenosed. Ost Cx to Prox Cx lesion is 45% stenosed. Prox-MID Cx lesion is 65% stenosed.   MODERATE-SEVERE THREE-VESSEL CAD: 100% proximal RCA occlusion with left-to-right collaterals faintly filling PDA and PL system (AV groove LCx-PL and LAD septal-PDA) Diffuse moderate mid LAD disease with 60% to 50% stenosis. Tandem 50% and 65% proximal and mid LCx with the 65% lesion being the most significant lesion. Moderately elevated LVEDP of 18-20 mmHg  Admitted 06/04/21 due to hypotension. Found to have AKI. Cardiology consult obtained. HF meds held and midodrine started. Gentle IVF given. Elevated LFT's noted & crestor & tylenol held. Discharged after 7 days. Admitted 05/18/21 due to acute on chronic HF. Initially given IV lasix with transition to oral diuretics with ~ 25L lost. Cardiology consult obtained. Discharged after 10 days.   She presents today for her initial visit with a chief complaint of moderate fatigue upon minimal exertion. She describes this as chronic in nature having been present for several years. She has associated  cough, shortness of breath, chest pain "twinges", back pain, difficulty sleeping & light-headedness along with this. She denies any pedal edema, palpitations or abdominal distention.   Has scales at home but hasn't been weighing daily due to being unsteady on her feet when standing on the scale.   Continues to take midodrine TID and daughter says that patients blood pressure has been normal since taking it.   Past Medical History:  Diagnosis Date   Abnormality of gait 07/26/2013   Acute respiratory failure with hypoxia ([facility]) 03/20/2020   Allergy    Arthritis    Atypical pneumonia 03/22/2020   CAD (coronary artery disease)    a. 03/2020 Cath: LM nl, LAD 60/51m, 55d, D1 70, D2 70, LCX 45ost/p, 65p, RCA 100ost CTO. RPDA fills via collats from dLAD. Inf septal fills via collats from 1st septal, RPAV fills via collats from LPAV, RPL1/2 sev dzs-->med rx.   Chickenpox    Chronic combined systolic (congestive) and diastolic (congestive) heart failure ([facility])    a. 02/2021 Echo: EF 40-45%, glob HK, gr2 DD. Sev red RV fxn, RVSP 69.108mmHg. Mod dil LA, sev dil RA. Sev MR. Mild to mod Anita. Mean MV grad 6.93mmHg. Mod-Sev TR. Mild AI. Mild Ao sclerosis w/o stenosis.   COPD (chronic obstructive pulmonary disease) ([facility])    Elevated troponin 03/22/2020   GERD (gastroesophageal reflux disease)    Headache(784.0)    Migraine   Hearing loss    Right ear secondary to infection   History of shingles    Ischemic cardiomyopathy    a. 02/2021 Echo: EF 40-45%, glob HK.  Migraines    Multiple sclerosis ([facility])    Obese    Optic neuritis    PAH (pulmonary artery hypertension) ([facility])    a. 02/2021 Echo: RVSP 69.13mmHg.   Prolonged QT interval 03/20/2020   Severe mitral regurgitation    a. 02/2021 Echo: Severe MR w/ mild to mod Anita.  Mean MV grad 6.59mmHg.   Past Surgical History:  Procedure Laterality Date   COLONOSCOPY WITH PROPOFOL N/A 08/03/2020   Procedure: COLONOSCOPY WITH PROPOFOL;  Surgeon: [name], MD;   Location: [facility] ENDOSCOPY;  Service: Gastroenterology;  Laterality: N/A;   Ganglioneuroma     Resection   LEFT HEART CATH AND CORONARY ANGIOGRAPHY N/A 03/23/2020   Procedure: LEFT HEART CATH AND CORONARY ANGIOGRAPHY;  Surgeon: Leonie Man, MD;  Location: [facility];  Service: Cardiovascular;  Laterality: N/A;   PILONIDAL CYST EXCISION     PILONIDAL CYST EXCISION  1983   TUMOR REMOVAL  2007/2008   Family History  Problem Relation Age of Onset   Skin cancer Mother    Lung cancer Father    Uterine cancer Sister    Heart disease Brother    Bipolar disorder Sister    Throat cancer Paternal Grandmother    Social History   Tobacco Use   Smoking status: Former    Packs/day: 1.00    Pack years: 0.00    Types: Cigarettes   Smokeless tobacco: Never  Substance Use Topics   Alcohol use: Yes    Comment: Occasional   Allergies  Allergen Reactions   Acthar Hp [Corticotropin] Other (See Comments)    Throat swelling and coughing up blood   Codeine    Prednisone    Prior to Admission medications   Medication Sig Start Date End Date Taking? Authorizing Provider  albuterol (VENTOLIN HFA) 108 (90 Base) MCG/ACT inhaler Inhale 1-2 puffs into the lungs every 6 (six) hours as needed for wheezing or shortness of breath. Use sparingly! 06/14/21  Yes [name], NP  calcium carbonate (TUMS EX) 750 MG chewable tablet Chew 1 tablet by mouth daily as needed for heartburn.   Yes [provider]  diclofenac Sodium (VOLTAREN) 1 % GEL Apply 2 g topically 4 (four) times daily as needed (back or joint pain). 06/11/21  Yes Elodia Florence., MD  Doxylamine Succinate, Sleep, (SLEEP AID PO) Take 1 tablet by mouth at bedtime as needed (sleep).    Yes [provider]  feeding supplement, ENSURE ENLIVE, (ENSURE ENLIVE) LIQD Take 237 mLs by mouth 2 (two) times daily between meals. 03/28/20  Yes Barton Dubois, MD  ferrous sulfate 325 (65 FE) MG EC tablet TAKE 1 TABLET BY MOUTH 2  TIMES DAILY WITH A MEAL. 05/28/21  Yes Earlie Server, MD  Fluticasone-Salmeterol (ADVAIR DISKUS) 250-50 MCG/DOSE AEPB Inhale 1 puff into the lungs 2 (two) times daily. 01/08/21  Yes [name], NP  guaiFENesin (MUCINEX) 600 MG 12 hr tablet Take 600 mg by mouth 2 (two) times daily as needed for cough.   Yes [provider]  midodrine (PROAMATINE) 5 MG tablet Take 1 tablet (5 mg total) by mouth 3 (three) times daily with meals. 06/11/21 07/11/21 Yes Elodia Florence., MD  Multiple Vitamin (MULTIVITAMIN WITH MINERALS) TABS tablet Take 1 tablet by mouth daily. 03/29/20  Yes Barton Dubois, MD  nitroGLYCERIN (NITROSTAT) 0.4 MG SL tablet Place 0.4 mg under the tongue every 5 (five) minutes as needed for chest pain. 04/23/20  Yes [provider]  omeprazole (PRILOSEC) 40 MG capsule Take 1 capsule (40 mg total) by mouth daily. 03/04/21  Yes [name], MD  Probiotic Product (PROBIOTIC ADVANCED) CAPS Take 1 capsule by mouth daily.   Yes [provider]  SUMAtriptan (IMITREX) 25 MG tablet Take 1 tablet by mouth at migraine onset, may repeat in 2 hours if headache persists or recurs. Do not exceed 2 tablets in 24 hours. 02/15/18  Yes [name], NP  vitamin B-12 (CYANOCOBALAMIN) 1000 MCG tablet Take 1,000 mcg by mouth daily.   Yes [provider]  cetirizine (ZYRTEC) 10 MG tablet Take 10 mg by mouth daily. Patient not taking: Reported on 06/18/2021    [provider]  torsemide (DEMADEX) 20 MG tablet Take only as needed.  Take for weight gain of 2 lbs in 1 day or 5 lbs in 1 week or obvious swelling. Patient not taking: Reported on 06/18/2021 06/11/21   Elodia Florence., MD     Review of Systems  Constitutional:  Positive for fatigue (easily). Negative for appetite change.  HENT:  Negative for congestion, postnasal drip and sore throat.   Eyes: Negative.   Respiratory:  Positive for cough (at times) and shortness of breath (improving).   Cardiovascular:   Positive for chest pain (twinge at times). Negative for palpitations and leg swelling.  Gastrointestinal:  Negative for abdominal distention and abdominal pain.  Endocrine: Negative.   Genitourinary: Negative.   Musculoskeletal:  Positive for back pain. Negative for neck pain.  Skin: Negative.   Allergic/Immunologic: Negative.   Neurological:  Positive for light-headedness ("all the time").  Hematological:  Negative for adenopathy. Does not bruise/bleed easily.  Psychiatric/Behavioral:  Positive for sleep disturbance (interrupted sleep pattern; sleeping on 2 pillows). Negative for dysphoric mood.    Vitals:   06/18/21 1338  BP: 124/74  Pulse: 81  Resp: 18  SpO2: 100%  Weight: 115 lb 2 oz (52.2 kg)  Height: 5\' 3"  (1.6 m)   Wt Readings from Last 3 Encounters:  06/18/21 115 lb 2 oz (52.2 kg)  06/14/21 110 lb (49.9 kg)  06/10/21 110 lb 3.7 oz (50 kg)   Lab Results  Component Value Date   CREATININE 1.13 (H) 06/14/2021   CREATININE 1.13 (H) 06/11/2021   CREATININE 1.14 (H) 06/10/2021    Physical Exam Vitals and nursing note reviewed. Exam conducted with a chaperone present (daughter).  Constitutional:      Appearance: Normal appearance.  HENT:     Head: Normocephalic and atraumatic.  Cardiovascular:     Rate and Rhythm: Normal rate and regular rhythm.     Heart sounds: Murmur heard.  Pulmonary:     Effort: Pulmonary effort is normal. No respiratory distress.     Breath sounds: No wheezing or rales.  Abdominal:     General: There is no distension.     Palpations: Abdomen is soft.     Tenderness: There is no abdominal tenderness.  Musculoskeletal:        General: No tenderness.     Right lower leg: No edema.     Left lower leg: No edema.  Skin:    General: Skin is warm and dry.  Neurological:     General: No focal deficit present.     Mental Status: She is alert and oriented to person, place, and time. Mental status is at baseline.  Psychiatric:        Mood and  Affect: Mood normal.  Behavior: Behavior normal.        Thought Content: Thought content normal.    Assessment & Plan:  1: Chronic heart failure with reduced ejection fraction- - NYHA class III - euvolemic today - not weighing daily but does have scales; says that she's too unsteady to stand on the scale; encouraged her to try putting the scale in between the walker using the walker as stability but explained to try this when her daughter is home to assist - advised to call for an overnight weight gain of > 2 pounds or a weekly weight gain of > 5 pounds - not adding salt and daughter says that she doesn't cook with salt either - saw cardiology Harrell Gave) 05/31/21; returns next week - receiving PT at home twice/ week - discussed possibly decreasing midodrine and then seeing how BP does to see if any GDMT can be slowly reintroduced - BNP 06/04/21 was 930.0  2: HTN- - BP looks good today; currently on midodrine TID - saw PCP Carlis Abbott) 06/14/21 - BMP 06/14/21 reviewed and showed sodium 139, potassium 4.6, creatinine 1.13   3: Valvular disease- - not a candidate for surgical mitral clip procedure  Medication list reviewed.   Return in 1 month or sooner for any questions/problems before then.

## 2021-06-18 ENCOUNTER — Ambulatory Visit: Payer: Medicare Other | Attending: Family | Admitting: Family

## 2021-06-18 ENCOUNTER — Other Ambulatory Visit: Payer: Self-pay

## 2021-06-18 ENCOUNTER — Encounter: Payer: Self-pay | Admitting: Family

## 2021-06-18 VITALS — BP 124/74 | HR 81 | Resp 18 | Ht 63.0 in | Wt 115.1 lb

## 2021-06-18 DIAGNOSIS — Z6820 Body mass index (BMI) 20.0-20.9, adult: Secondary | ICD-10-CM | POA: Insufficient documentation

## 2021-06-18 DIAGNOSIS — I340 Nonrheumatic mitral (valve) insufficiency: Secondary | ICD-10-CM

## 2021-06-18 DIAGNOSIS — J449 Chronic obstructive pulmonary disease, unspecified: Secondary | ICD-10-CM | POA: Insufficient documentation

## 2021-06-18 DIAGNOSIS — E669 Obesity, unspecified: Secondary | ICD-10-CM | POA: Insufficient documentation

## 2021-06-18 DIAGNOSIS — I5042 Chronic combined systolic (congestive) and diastolic (congestive) heart failure: Secondary | ICD-10-CM | POA: Diagnosis not present

## 2021-06-18 DIAGNOSIS — I5022 Chronic systolic (congestive) heart failure: Secondary | ICD-10-CM

## 2021-06-18 DIAGNOSIS — Z8249 Family history of ischemic heart disease and other diseases of the circulatory system: Secondary | ICD-10-CM | POA: Diagnosis not present

## 2021-06-18 DIAGNOSIS — Z7951 Long term (current) use of inhaled steroids: Secondary | ICD-10-CM | POA: Diagnosis not present

## 2021-06-18 DIAGNOSIS — G35 Multiple sclerosis: Secondary | ICD-10-CM | POA: Diagnosis not present

## 2021-06-18 DIAGNOSIS — J9601 Acute respiratory failure with hypoxia: Secondary | ICD-10-CM | POA: Diagnosis not present

## 2021-06-18 DIAGNOSIS — I110 Hypertensive heart disease with heart failure: Secondary | ICD-10-CM | POA: Insufficient documentation

## 2021-06-18 DIAGNOSIS — I255 Ischemic cardiomyopathy: Secondary | ICD-10-CM | POA: Diagnosis not present

## 2021-06-18 DIAGNOSIS — I2510 Atherosclerotic heart disease of native coronary artery without angina pectoris: Secondary | ICD-10-CM | POA: Insufficient documentation

## 2021-06-18 DIAGNOSIS — Z87891 Personal history of nicotine dependence: Secondary | ICD-10-CM | POA: Insufficient documentation

## 2021-06-18 DIAGNOSIS — I272 Pulmonary hypertension, unspecified: Secondary | ICD-10-CM | POA: Diagnosis not present

## 2021-06-18 DIAGNOSIS — I10 Essential (primary) hypertension: Secondary | ICD-10-CM

## 2021-06-18 DIAGNOSIS — N390 Urinary tract infection, site not specified: Secondary | ICD-10-CM | POA: Diagnosis not present

## 2021-06-18 DIAGNOSIS — I2721 Secondary pulmonary arterial hypertension: Secondary | ICD-10-CM | POA: Insufficient documentation

## 2021-06-18 DIAGNOSIS — I5043 Acute on chronic combined systolic (congestive) and diastolic (congestive) heart failure: Secondary | ICD-10-CM | POA: Diagnosis not present

## 2021-06-18 NOTE — Patient Instructions (Addendum)
Begin weighing daily and call for an overnight weight gain of > 2 pounds or a weekly weight gain of >5 pounds. 

## 2021-06-19 ENCOUNTER — Ambulatory Visit: Payer: Medicare Other | Admitting: Physician Assistant

## 2021-06-19 DIAGNOSIS — I5043 Acute on chronic combined systolic (congestive) and diastolic (congestive) heart failure: Secondary | ICD-10-CM | POA: Diagnosis not present

## 2021-06-19 DIAGNOSIS — I255 Ischemic cardiomyopathy: Secondary | ICD-10-CM | POA: Diagnosis not present

## 2021-06-19 DIAGNOSIS — I272 Pulmonary hypertension, unspecified: Secondary | ICD-10-CM | POA: Diagnosis not present

## 2021-06-19 DIAGNOSIS — I110 Hypertensive heart disease with heart failure: Secondary | ICD-10-CM | POA: Diagnosis not present

## 2021-06-19 DIAGNOSIS — N390 Urinary tract infection, site not specified: Secondary | ICD-10-CM | POA: Diagnosis not present

## 2021-06-19 DIAGNOSIS — J9601 Acute respiratory failure with hypoxia: Secondary | ICD-10-CM | POA: Diagnosis not present

## 2021-06-20 ENCOUNTER — Encounter: Payer: Self-pay | Admitting: Neurology

## 2021-06-24 ENCOUNTER — Other Ambulatory Visit: Payer: Self-pay | Admitting: Primary Care

## 2021-06-24 DIAGNOSIS — N6459 Other signs and symptoms in breast: Secondary | ICD-10-CM

## 2021-06-24 DIAGNOSIS — Z1231 Encounter for screening mammogram for malignant neoplasm of breast: Secondary | ICD-10-CM

## 2021-06-24 NOTE — Telephone Encounter (Signed)
Please advise 

## 2021-06-25 DIAGNOSIS — J9601 Acute respiratory failure with hypoxia: Secondary | ICD-10-CM | POA: Diagnosis not present

## 2021-06-25 DIAGNOSIS — I5043 Acute on chronic combined systolic (congestive) and diastolic (congestive) heart failure: Secondary | ICD-10-CM | POA: Diagnosis not present

## 2021-06-25 DIAGNOSIS — I110 Hypertensive heart disease with heart failure: Secondary | ICD-10-CM | POA: Diagnosis not present

## 2021-06-25 DIAGNOSIS — I255 Ischemic cardiomyopathy: Secondary | ICD-10-CM | POA: Diagnosis not present

## 2021-06-25 DIAGNOSIS — I272 Pulmonary hypertension, unspecified: Secondary | ICD-10-CM | POA: Diagnosis not present

## 2021-06-25 DIAGNOSIS — N390 Urinary tract infection, site not specified: Secondary | ICD-10-CM | POA: Diagnosis not present

## 2021-06-26 ENCOUNTER — Other Ambulatory Visit: Payer: Self-pay | Admitting: Primary Care

## 2021-06-26 DIAGNOSIS — R7989 Other specified abnormal findings of blood chemistry: Secondary | ICD-10-CM

## 2021-06-26 NOTE — Telephone Encounter (Signed)
I don't know what she's referring to. I don't see a recent screening mammogram or prior diagnostic mammogram.   Need to find out which breast center they are referring to and find out what orders are needed.

## 2021-06-27 DIAGNOSIS — I5043 Acute on chronic combined systolic (congestive) and diastolic (congestive) heart failure: Secondary | ICD-10-CM | POA: Diagnosis not present

## 2021-06-27 DIAGNOSIS — I255 Ischemic cardiomyopathy: Secondary | ICD-10-CM | POA: Diagnosis not present

## 2021-06-27 DIAGNOSIS — N390 Urinary tract infection, site not specified: Secondary | ICD-10-CM | POA: Diagnosis not present

## 2021-06-27 DIAGNOSIS — I272 Pulmonary hypertension, unspecified: Secondary | ICD-10-CM | POA: Diagnosis not present

## 2021-06-27 DIAGNOSIS — I110 Hypertensive heart disease with heart failure: Secondary | ICD-10-CM | POA: Diagnosis not present

## 2021-06-27 DIAGNOSIS — J9601 Acute respiratory failure with hypoxia: Secondary | ICD-10-CM | POA: Diagnosis not present

## 2021-06-27 NOTE — Progress Notes (Signed)
Office Visit    Patient Name: Anita [name] Date of Encounter: 06/28/2021  PCP:  [name], NP   [place]  Cardiologist:  Buford Dresser, MD  Advanced Practice Provider:  No care team member to display Electrophysiologist:  None    Chief Complaint    Anita [name] is a 60 y.o. female with a hx of MS, CAD, severe MR with moderate mitral stenosis, COPD, tobacco use, systolic and diastolic heart failure  presents today for hospital follow up   Past Medical History    Past Medical History:  Diagnosis Date   Abnormality of gait 07/26/2013   Acute respiratory failure with hypoxia ([place]) 03/20/2020   Allergy    Arthritis    Atypical pneumonia 03/22/2020   CAD (coronary artery disease)    a. 03/2020 Cath: LM nl, LAD 60/58m, 55d, D1 70, D2 70, LCX 45ost/p, 65p, RCA 100ost CTO. RPDA fills via collats from dLAD. Inf septal fills via collats from 1st septal, RPAV fills via collats from LPAV, RPL1/2 sev dzs-->med rx.   Chickenpox    Chronic combined systolic (congestive) and diastolic (congestive) heart failure ([place])    a. 02/2021 Echo: EF 40-45%, glob HK, gr2 DD. Sev red RV fxn, RVSP 69.28mmHg. Mod dil LA, sev dil RA. Sev MR. Mild to mod MS. Mean MV grad 6.69mmHg. Mod-Sev TR. Mild AI. Mild Ao sclerosis w/o stenosis.   COPD (chronic obstructive pulmonary disease) (HCC)    Elevated troponin 03/22/2020   GERD (gastroesophageal reflux disease)    Headache(784.0)    Migraine   Hearing loss    Right ear secondary to infection   History of shingles    Ischemic cardiomyopathy    a. 02/2021 Echo: EF 40-45%, glob HK.   Migraines    Multiple sclerosis (HCC)    Obese    Optic neuritis    PAH (pulmonary artery hypertension) ([place])    a. 02/2021 Echo: RVSP 69.23mmHg.   Prolonged QT interval 03/20/2020   Severe mitral regurgitation    a. 02/2021 Echo: Severe MR w/ mild to mod MS.  Mean MV grad 6.29mmHg.   Past Surgical History:  Procedure  Laterality Date   COLONOSCOPY WITH PROPOFOL N/A 08/03/2020   Procedure: COLONOSCOPY WITH PROPOFOL;  Surgeon: [name], MD;  Location: [facility] ENDOSCOPY;  Service: Gastroenterology;  Laterality: N/A;   Ganglioneuroma     Resection   LEFT HEART CATH AND CORONARY ANGIOGRAPHY N/A 03/23/2020   Procedure: LEFT HEART CATH AND CORONARY ANGIOGRAPHY;  Surgeon: Leonie Man, MD;  Location: [place] [facility] LAB;  Service: Cardiovascular;  Laterality: N/A;   PILONIDAL CYST EXCISION     PILONIDAL CYST EXCISION  1983   TUMOR REMOVAL  2007/2008    Allergies  Allergies  Allergen Reactions   Acthar Hp [Corticotropin] Other (See Comments)    Throat swelling and coughing up blood   Codeine    Prednisone     History of Present Illness    Anita [name] is a 60 y.o. female with a hx of MS, CAD, severe MR with moderate mitral stenosis, COPD, tobacco use, systolic and diastolic heart failure last seen while hospitalized.  She follows with Dr. Harrell Gave. She has had recurrent admissions for acute on chronic combined systolic and diastolic heart failure with known severe MR and moderate MS. She was seen in clinic 05/31/21 by Dr. Harrell Gave with extensive discussion regarding goals of care - she was interested in being DNR and encouraged to  discuss hospice and palliative care with her family.   She was admitted 06/04/21 for hypotension and diarrhea concerning for melena. She had AKI and hypotension. Hemoglobin was stable and dark stools thought due to iron use. Hypotension were thought due to medication and all heart failure medications held and midodrine initiated. Hospitalization complicated by elevated liver enzymes.   She presents today for follow-up with her daughter. Monitoring blood pressure at home have been well controlled. Checking blood pressure 15 or 20 minutes after her morning dose of Midodrine. Monday had elevated blood pressure readings. She did take a dose of Torsemide Monday as her  weight was. 114 Friday to 118 on Monday. She to Torsemide 40mg  that morning. She did not notice lightheadedness nor dizziness.   Her chief complaint today is back pain she is hopeful to be able to resume Tylenol if her liver enzymes returned normal.  We discussed trying a heat pack or ice.  Reports no shortness of breath at rest and that her dyspnea on exertion is stable at her baseline.  Reports no chest pain, pressure, or tightness. No  orthopnea, PND. Reports no palpitations.    EKGs/Labs/Other Studies Reviewed:   The following studies were reviewed today:  Echo 3.3.22 1. Left ventricular ejection fraction, by estimation, is 40 to 45%. Left  ventricular ejection fraction by 3D volume is 47 %. The left ventricle has  mildly decreased function. The left ventricle demonstrates global  hypokinesis. Left ventricular diastolic   parameters are consistent with Grade II diastolic dysfunction  (pseudonormalization). Elevated left ventricular end-diastolic pressure.   2. Right ventricular systolic function is severely reduced. The right  ventricular size is mildly enlarged. There is severely elevated pulmonary  artery systolic pressure. The estimated right ventricular systolic  pressure is 06.2 mmHg.   3. Left atrial size was moderately dilated.   4. Right atrial size was severely dilated.   5. The mitral valve is degenerative with mild to moderate thickening of  the mitral valve leaflets. There is teathering of the MV leaflets due to  mild LV dysfunction. Severe mitral valve regurgitation. Mild to moderate  mitral stenosis. The mean mitral  valve gradient is 6.5 mmHg.   6. Tricuspid valve regurgitation is moderate to severe.   7. The aortic valve is tricuspid. Aortic valve regurgitation is mild.  Mild aortic valve sclerosis is present, with no evidence of aortic valve  stenosis. Aortic regurgitation PHT measures 499 msec.   8. The inferior vena cava is dilated in size with <50% respiratory   variability, suggesting right atrial pressure of 15 mmHg.   9. There is a trivial pericardial effusion posterior to the left  ventricle.  10. Compared to prior echo, Mitral regurgitation appears worse and RV  dysfunction is now present. There is severe Pulmonary HTN.   Echo 03/20/20  1. Left ventricular ejection fraction, by estimation, is 40 to 45%. The  left ventricle has mildly decreased function. The left ventricle  demonstrates global hypokinesis. The left ventricular internal cavity size  was mildly dilated. Left ventricular  diastolic function could not be evaluated.   2. Right ventricular systolic function is normal. The right ventricular  size is normal. There is moderately elevated pulmonary artery systolic  pressure.   3. Left atrial size was mildly dilated.   4. Moderate mitral subvalvular thickening/fibrosis.   5. The mitral valve is rheumatic. Moderate to severe mitral valve  regurgitation. Mild mitral stenosis. The mean mitral valve gradient is 7.3  mmHg with average  heart rate of 105 bpm.   6. The aortic valve is normal in structure. Aortic valve regurgitation is  not visualized.   7. The inferior vena cava is dilated in size with <50% respiratory  variability, suggesting right atrial pressure of 15 mmHg.   Cath 03/23/20 Hemodynamics: LV end diastolic pressure is moderately elevated. ----Coronary Angiography----- Ost RCA to Dist RCA lesion is 100% stenosed.-The PDA and PL system fills via faint collaterals from the AV groove LCx and LAD septals. Mid LAD-1 lesion is 60% stenosed. Mid LAD-2 lesion is 50% stenosed. Dist LAD lesion is 55% stenosed with 70% stenosed side branch in 2nd Diag. 1st Diag lesion is 70% stenosed. Ost Cx to Prox Cx lesion is 45% stenosed. Prox-MID Cx lesion is 65% stenosed.   MODERATE-SEVERE THREE-VESSEL CAD: 100% proximal RCA occlusion with left-to-right collaterals faintly filling PDA and PL system (AV groove LCx-PL and LAD septal-PDA) Diffuse  moderate mid LAD disease with 60% to 50% stenosis. Tandem 50% and 65% proximal and mid LCx with the 65% lesion being the most significant lesion. Moderately elevated LVEDP of 18-20 mmHg   Given the extent of disease in the LAD and LCx, neither lesion is very amenable to PCI, and RCA is chronically occluded.  Given her comorbidities, I do not think she would be a good CABG candidate especially in light of the fact that she would likely require valve surgery as well.  She would not recover well.   Recommendations aggressive medical management.  EKG:  No EKG today  Recent Labs: 08/14/2020: TSH 1.437 06/04/2021: B Natriuretic Peptide 930.0 06/11/2021: Magnesium 1.7 06/14/2021: ALT 88; BUN 17; Creat 1.13; Hemoglobin 10.1; Platelets 370; Potassium 4.6; Sodium 139  Recent Lipid Panel    Component Value Date/Time   CHOL 104 01/08/2021 1016   TRIG 66.0 01/08/2021 1016   HDL 40.20 01/08/2021 1016   CHOLHDL 3 01/08/2021 1016   VLDL 13.2 01/08/2021 1016   LDLCALC 51 01/08/2021 1016    Home Medications   Current Meds  Medication Sig   albuterol (VENTOLIN HFA) 108 (90 Base) MCG/ACT inhaler Inhale 1-2 puffs into the lungs every 6 (six) hours as needed for wheezing or shortness of breath. Use sparingly!   calcium carbonate (TUMS EX) 750 MG chewable tablet Chew 1 tablet by mouth daily as needed for heartburn.   diclofenac Sodium (VOLTAREN) 1 % GEL Apply 2 g topically 4 (four) times daily as needed (back or joint pain).   Doxylamine Succinate, Sleep, (SLEEP AID PO) Take 1 tablet by mouth at bedtime as needed (sleep).    feeding supplement, ENSURE ENLIVE, (ENSURE ENLIVE) LIQD Take 237 mLs by mouth 2 (two) times daily between meals.   ferrous sulfate 325 (65 FE) MG EC tablet TAKE 1 TABLET BY MOUTH 2 TIMES DAILY WITH A MEAL.   Fluticasone-Salmeterol (ADVAIR DISKUS) 250-50 MCG/DOSE AEPB Inhale 1 puff into the lungs 2 (two) times daily.   guaiFENesin (MUCINEX) 600 MG 12 hr tablet Take 600 mg by mouth 2 (two)  times daily as needed for cough.   midodrine (PROAMATINE) 5 MG tablet Take 1 tablet (5 mg total) by mouth 3 (three) times daily with meals.   Multiple Vitamin (MULTIVITAMIN WITH MINERALS) TABS tablet Take 1 tablet by mouth daily.   nitroGLYCERIN (NITROSTAT) 0.4 MG SL tablet Place 0.4 mg under the tongue every 5 (five) minutes as needed for chest pain.   omeprazole (PRILOSEC) 40 MG capsule Take 1 capsule (40 mg total) by mouth daily.   Probiotic Product (  PROBIOTIC ADVANCED) CAPS Take 1 capsule by mouth daily.   SUMAtriptan (IMITREX) 25 MG tablet Take 1 tablet by mouth at migraine onset, may repeat in 2 hours if headache persists or recurs. Do not exceed 2 tablets in 24 hours.   torsemide (DEMADEX) 20 MG tablet Take only as needed.  Take for weight gain of 2 lbs in 1 day or 5 lbs in 1 week or obvious swelling. (Patient taking differently: Take only as needed.  Take for weight gain of 2 lbs in 1 day or 5 lbs in 1 week or obvious swelling.)   vitamin B-12 (CYANOCOBALAMIN) 1000 MCG tablet Take 1,000 mcg by mouth daily.     Review of Systems      All other systems reviewed and are otherwise negative except as noted above.  Physical Exam    VS:  BP 124/70   Pulse (!) 103   Ht 5\' 3"  (1.6 m)   Wt 116 lb (52.6 kg)   BMI 20.55 kg/m  , BMI Body mass index is 20.55 kg/m.  Wt Readings from Last 3 Encounters:  06/28/21 116 lb (52.6 kg)  06/18/21 115 lb 2 oz (52.2 kg)  06/14/21 110 lb (49.9 kg)     GEN: Well nourished, well developed, in no acute distress. HEENT: normal. Neck: Supple, no JVD, carotid bruits, or masses. Cardiac: RRR, no murmurs, rubs, or gallops. No clubbing, cyanosis, edema.  Radials/PT 2+ and equal bilaterally.  Respiratory:  Respirations regular and unlabored, clear to auscultation bilaterally. GI: Soft, nontender, nondistended. MS: No deformity or atrophy. Skin: Warm and dry, no rash. Neuro:  Strength and sensation are intact. Psych: Normal affect.  Assessment & Plan     Orthostatic hypotension - On midodrine 5 mg TID.  No recurrent hypotension.  We discussed that if her systolic blood pressure is consistently greater than 130 we would consider reducing dose to 2.5 mg 3 times daily.  Combined systolic and diastolic heart failure / Severe MR / Moderate MS - Not surgical candidate.  GDMT limited by orthostatic hypotension.  Continue as needed torsemide.  Discussed daily weights and importance of monitoring for 2 pound weight gain overnight or 5 pounds in 1 week.  Low-salt diet and fluid restriction encouraged.  If symptoms progress, will require referral for palliative care.  CKD IIIa - Careful titration of diuretic and antihypertensive.    Elevated LFT - Statin on hold due to elevated LFT. 06/11/21 AST 177 and ALT 172. Repeat by PCP 06/14/21 AST 54, ALT 88. Had repeat labs this morning at PCP.   HLD - Statin on hold due to elevated LFT, plan to resume when normalized with careful monitoring of LFTs.  CAD - Stable with no anginal symptoms. No indication for ischemic evaluation.  GDMT limited at this time by transaminitis and orthostatic hypotension.  COPD -no signs of acute exacerbation.  Continue to follow with PCP.  MS - Primary care has referred to neurology.   Abnormal para aortic lymph node / history of gangioneuroma - Continue to follow with PCP.   Disposition: Follow up  in August with [facility] heart failure clinic and in October as scheduled  with Dr. Harrell Gave  Signed, Loel Dubonnet, NP 06/28/2021, 10:44 AM [place]

## 2021-06-28 ENCOUNTER — Other Ambulatory Visit: Payer: Self-pay

## 2021-06-28 ENCOUNTER — Encounter (HOSPITAL_BASED_OUTPATIENT_CLINIC_OR_DEPARTMENT_OTHER): Payer: Self-pay | Admitting: Family

## 2021-06-28 ENCOUNTER — Ambulatory Visit (INDEPENDENT_AMBULATORY_CARE_PROVIDER_SITE_OTHER): Payer: Medicare Other | Admitting: Family

## 2021-06-28 ENCOUNTER — Other Ambulatory Visit (INDEPENDENT_AMBULATORY_CARE_PROVIDER_SITE_OTHER): Payer: Medicare Other

## 2021-06-28 VITALS — BP 124/70 | HR 103 | Ht 63.0 in | Wt 116.0 lb

## 2021-06-28 DIAGNOSIS — E782 Mixed hyperlipidemia: Secondary | ICD-10-CM | POA: Diagnosis not present

## 2021-06-28 DIAGNOSIS — I051 Rheumatic mitral insufficiency: Secondary | ICD-10-CM

## 2021-06-28 DIAGNOSIS — I340 Nonrheumatic mitral (valve) insufficiency: Secondary | ICD-10-CM

## 2021-06-28 DIAGNOSIS — I5042 Chronic combined systolic (congestive) and diastolic (congestive) heart failure: Secondary | ICD-10-CM

## 2021-06-28 DIAGNOSIS — I951 Orthostatic hypotension: Secondary | ICD-10-CM

## 2021-06-28 DIAGNOSIS — R7989 Other specified abnormal findings of blood chemistry: Secondary | ICD-10-CM

## 2021-06-28 DIAGNOSIS — I2510 Atherosclerotic heart disease of native coronary artery without angina pectoris: Secondary | ICD-10-CM

## 2021-06-28 LAB — HEPATIC FUNCTION PANEL
ALT: 14 U/L (ref 0–35)
AST: 17 U/L (ref 0–37)
Albumin: 3.7 g/dL (ref 3.5–5.2)
Alkaline Phosphatase: 120 U/L — ABNORMAL HIGH (ref 39–117)
Bilirubin, Direct: 0.2 mg/dL (ref 0.0–0.3)
Total Bilirubin: 0.6 mg/dL (ref 0.2–1.2)
Total Protein: 7.3 g/dL (ref 6.0–8.3)

## 2021-06-28 MED ORDER — MIDODRINE HCL 5 MG PO TABS: 5.0000 mg | ORAL_TABLET | Freq: Three times a day (TID) | ORAL | 2 refills | Status: DC

## 2021-06-28 NOTE — Patient Instructions (Addendum)
Medication Instructions:  Continue your current medications.   If your blood pressure is consistently more than 130 for the top number we will consider reducing your dose of Midodrine.  *If you need a refill on your cardiac medications before your next appointment, please call your pharmacy*   Lab Work: None ordered today  Testing/Procedures: None ordered today.    Follow-Up: At [facility], you and your health needs are our priority.  As part of our continuing mission to provide you with exceptional heart care, we have created designated Provider Care Teams.  These Care Teams include your primary Cardiologist (physician) and Advanced Practice Providers (APPs -  Physician Assistants and Nurse Practitioners) who all work together to provide you with the care you need, when you need it.  We recommend signing up for the patient portal called "MyChart".  Sign up information is provided on this After Visit Summary.  MyChart is used to connect with patients for Virtual Visits (Telemedicine).  Patients are able to view lab/test results, encounter notes, upcoming appointments, etc.  Non-urgent messages can be sent to your provider as well.   To learn more about what you can do with MyChart, go to [email or web address].    Your next appointment:    As scheduled in August with Anita Price, NP at [place] In October as scheduled with Dr. Harrell Gave  Other Instructions  Recommend weighing daily and keeping a log. Please take your Torsemide if you have weight gain of 2 pounds overnight or 5 pounds in 1 week. If you have this happen more than twice per week or if Torsemide does ont help to reduce the swelling, please contact our office.   Date  Time Weight                                            To prevent or reduce lower extremity swelling: Eat a low salt diet. Salt makes the body hold onto extra fluid which causes swelling. Sit with legs  elevated. For example, in the recliner or on an [place].  Wear knee-high compression stockings during the daytime. Ones labeled 15-20 mmHg provide good compression.   Heart Healthy Diet Recommendations: A low-salt diet is recommended. Meats should be grilled, baked, or boiled. Avoid fried foods. Focus on lean protein sources like fish or chicken with vegetables and fruits. The American [employer] Association is a Microbiologist!

## 2021-06-29 DIAGNOSIS — I272 Pulmonary hypertension, unspecified: Secondary | ICD-10-CM | POA: Diagnosis not present

## 2021-06-29 DIAGNOSIS — N390 Urinary tract infection, site not specified: Secondary | ICD-10-CM | POA: Diagnosis not present

## 2021-06-29 DIAGNOSIS — I5043 Acute on chronic combined systolic (congestive) and diastolic (congestive) heart failure: Secondary | ICD-10-CM | POA: Diagnosis not present

## 2021-06-29 DIAGNOSIS — I110 Hypertensive heart disease with heart failure: Secondary | ICD-10-CM | POA: Diagnosis not present

## 2021-06-29 DIAGNOSIS — I255 Ischemic cardiomyopathy: Secondary | ICD-10-CM | POA: Diagnosis not present

## 2021-06-29 DIAGNOSIS — J9601 Acute respiratory failure with hypoxia: Secondary | ICD-10-CM | POA: Diagnosis not present

## 2021-06-30 DIAGNOSIS — N1831 Chronic kidney disease, stage 3a: Secondary | ICD-10-CM | POA: Diagnosis not present

## 2021-06-30 DIAGNOSIS — I951 Orthostatic hypotension: Secondary | ICD-10-CM | POA: Diagnosis not present

## 2021-06-30 DIAGNOSIS — I5043 Acute on chronic combined systolic (congestive) and diastolic (congestive) heart failure: Secondary | ICD-10-CM | POA: Diagnosis not present

## 2021-06-30 DIAGNOSIS — I255 Ischemic cardiomyopathy: Secondary | ICD-10-CM | POA: Diagnosis not present

## 2021-06-30 DIAGNOSIS — Z87891 Personal history of nicotine dependence: Secondary | ICD-10-CM | POA: Diagnosis not present

## 2021-06-30 DIAGNOSIS — G35 Multiple sclerosis: Secondary | ICD-10-CM | POA: Diagnosis not present

## 2021-06-30 DIAGNOSIS — I272 Pulmonary hypertension, unspecified: Secondary | ICD-10-CM | POA: Diagnosis not present

## 2021-06-30 DIAGNOSIS — I2510 Atherosclerotic heart disease of native coronary artery without angina pectoris: Secondary | ICD-10-CM | POA: Diagnosis not present

## 2021-06-30 DIAGNOSIS — E860 Dehydration: Secondary | ICD-10-CM | POA: Diagnosis not present

## 2021-06-30 DIAGNOSIS — I130 Hypertensive heart and chronic kidney disease with heart failure and stage 1 through stage 4 chronic kidney disease, or unspecified chronic kidney disease: Secondary | ICD-10-CM | POA: Diagnosis not present

## 2021-06-30 DIAGNOSIS — I340 Nonrheumatic mitral (valve) insufficiency: Secondary | ICD-10-CM | POA: Diagnosis not present

## 2021-06-30 DIAGNOSIS — Z7982 Long term (current) use of aspirin: Secondary | ICD-10-CM | POA: Diagnosis not present

## 2021-06-30 DIAGNOSIS — J449 Chronic obstructive pulmonary disease, unspecified: Secondary | ICD-10-CM | POA: Diagnosis not present

## 2021-06-30 DIAGNOSIS — N179 Acute kidney failure, unspecified: Secondary | ICD-10-CM | POA: Diagnosis not present

## 2021-07-02 DIAGNOSIS — N179 Acute kidney failure, unspecified: Secondary | ICD-10-CM | POA: Diagnosis not present

## 2021-07-02 DIAGNOSIS — I951 Orthostatic hypotension: Secondary | ICD-10-CM | POA: Diagnosis not present

## 2021-07-02 DIAGNOSIS — N1831 Chronic kidney disease, stage 3a: Secondary | ICD-10-CM | POA: Diagnosis not present

## 2021-07-02 DIAGNOSIS — E860 Dehydration: Secondary | ICD-10-CM | POA: Diagnosis not present

## 2021-07-02 DIAGNOSIS — I130 Hypertensive heart and chronic kidney disease with heart failure and stage 1 through stage 4 chronic kidney disease, or unspecified chronic kidney disease: Secondary | ICD-10-CM | POA: Diagnosis not present

## 2021-07-02 DIAGNOSIS — I5043 Acute on chronic combined systolic (congestive) and diastolic (congestive) heart failure: Secondary | ICD-10-CM | POA: Diagnosis not present

## 2021-07-03 DIAGNOSIS — I5043 Acute on chronic combined systolic (congestive) and diastolic (congestive) heart failure: Secondary | ICD-10-CM | POA: Diagnosis not present

## 2021-07-03 DIAGNOSIS — N179 Acute kidney failure, unspecified: Secondary | ICD-10-CM | POA: Diagnosis not present

## 2021-07-03 DIAGNOSIS — N1831 Chronic kidney disease, stage 3a: Secondary | ICD-10-CM | POA: Diagnosis not present

## 2021-07-03 DIAGNOSIS — E860 Dehydration: Secondary | ICD-10-CM | POA: Diagnosis not present

## 2021-07-03 DIAGNOSIS — I130 Hypertensive heart and chronic kidney disease with heart failure and stage 1 through stage 4 chronic kidney disease, or unspecified chronic kidney disease: Secondary | ICD-10-CM | POA: Diagnosis not present

## 2021-07-03 DIAGNOSIS — I951 Orthostatic hypotension: Secondary | ICD-10-CM | POA: Diagnosis not present

## 2021-07-04 ENCOUNTER — Other Ambulatory Visit: Payer: Self-pay

## 2021-07-04 DIAGNOSIS — D509 Iron deficiency anemia, unspecified: Secondary | ICD-10-CM

## 2021-07-05 ENCOUNTER — Inpatient Hospital Stay: Payer: Medicare Other

## 2021-07-05 ENCOUNTER — Encounter (HOSPITAL_BASED_OUTPATIENT_CLINIC_OR_DEPARTMENT_OTHER): Payer: Self-pay

## 2021-07-05 NOTE — Telephone Encounter (Signed)
Please see response, CT scan on 06/04/21 has notation of breast tissues thickening. Thank you

## 2021-07-08 ENCOUNTER — Inpatient Hospital Stay: Payer: Medicare Other | Admitting: Oncology

## 2021-07-08 DIAGNOSIS — I130 Hypertensive heart and chronic kidney disease with heart failure and stage 1 through stage 4 chronic kidney disease, or unspecified chronic kidney disease: Secondary | ICD-10-CM | POA: Diagnosis not present

## 2021-07-08 DIAGNOSIS — E860 Dehydration: Secondary | ICD-10-CM | POA: Diagnosis not present

## 2021-07-08 DIAGNOSIS — I951 Orthostatic hypotension: Secondary | ICD-10-CM | POA: Diagnosis not present

## 2021-07-08 DIAGNOSIS — I5043 Acute on chronic combined systolic (congestive) and diastolic (congestive) heart failure: Secondary | ICD-10-CM | POA: Diagnosis not present

## 2021-07-08 DIAGNOSIS — N1831 Chronic kidney disease, stage 3a: Secondary | ICD-10-CM | POA: Diagnosis not present

## 2021-07-08 DIAGNOSIS — N179 Acute kidney failure, unspecified: Secondary | ICD-10-CM | POA: Diagnosis not present

## 2021-07-13 ENCOUNTER — Other Ambulatory Visit: Payer: Self-pay | Admitting: Family Medicine

## 2021-07-13 DIAGNOSIS — I10 Essential (primary) hypertension: Secondary | ICD-10-CM

## 2021-07-17 DIAGNOSIS — I5043 Acute on chronic combined systolic (congestive) and diastolic (congestive) heart failure: Secondary | ICD-10-CM | POA: Diagnosis not present

## 2021-07-17 DIAGNOSIS — I951 Orthostatic hypotension: Secondary | ICD-10-CM | POA: Diagnosis not present

## 2021-07-17 DIAGNOSIS — I130 Hypertensive heart and chronic kidney disease with heart failure and stage 1 through stage 4 chronic kidney disease, or unspecified chronic kidney disease: Secondary | ICD-10-CM | POA: Diagnosis not present

## 2021-07-17 DIAGNOSIS — N1831 Chronic kidney disease, stage 3a: Secondary | ICD-10-CM | POA: Diagnosis not present

## 2021-07-17 DIAGNOSIS — N179 Acute kidney failure, unspecified: Secondary | ICD-10-CM | POA: Diagnosis not present

## 2021-07-17 DIAGNOSIS — E860 Dehydration: Secondary | ICD-10-CM | POA: Diagnosis not present

## 2021-07-18 DIAGNOSIS — I130 Hypertensive heart and chronic kidney disease with heart failure and stage 1 through stage 4 chronic kidney disease, or unspecified chronic kidney disease: Secondary | ICD-10-CM | POA: Diagnosis not present

## 2021-07-18 DIAGNOSIS — N1831 Chronic kidney disease, stage 3a: Secondary | ICD-10-CM | POA: Diagnosis not present

## 2021-07-18 DIAGNOSIS — E860 Dehydration: Secondary | ICD-10-CM | POA: Diagnosis not present

## 2021-07-18 DIAGNOSIS — I951 Orthostatic hypotension: Secondary | ICD-10-CM | POA: Diagnosis not present

## 2021-07-18 DIAGNOSIS — N179 Acute kidney failure, unspecified: Secondary | ICD-10-CM | POA: Diagnosis not present

## 2021-07-18 DIAGNOSIS — I5043 Acute on chronic combined systolic (congestive) and diastolic (congestive) heart failure: Secondary | ICD-10-CM | POA: Diagnosis not present

## 2021-07-18 MED ORDER — MIDODRINE HCL 2.5 MG PO TABS: 2.5000 mg | ORAL_TABLET | Freq: Three times a day (TID) | ORAL | 5 refills | Status: DC

## 2021-07-18 NOTE — Addendum Note (Signed)
Addended by: Loel Dubonnet on: 07/18/2021 09:32 AM   Modules accepted: Orders

## 2021-07-19 DIAGNOSIS — I951 Orthostatic hypotension: Secondary | ICD-10-CM | POA: Diagnosis not present

## 2021-07-19 DIAGNOSIS — N179 Acute kidney failure, unspecified: Secondary | ICD-10-CM | POA: Diagnosis not present

## 2021-07-19 DIAGNOSIS — I130 Hypertensive heart and chronic kidney disease with heart failure and stage 1 through stage 4 chronic kidney disease, or unspecified chronic kidney disease: Secondary | ICD-10-CM | POA: Diagnosis not present

## 2021-07-19 DIAGNOSIS — E860 Dehydration: Secondary | ICD-10-CM | POA: Diagnosis not present

## 2021-07-19 DIAGNOSIS — N1831 Chronic kidney disease, stage 3a: Secondary | ICD-10-CM | POA: Diagnosis not present

## 2021-07-19 DIAGNOSIS — I5043 Acute on chronic combined systolic (congestive) and diastolic (congestive) heart failure: Secondary | ICD-10-CM | POA: Diagnosis not present

## 2021-07-22 DIAGNOSIS — E860 Dehydration: Secondary | ICD-10-CM | POA: Diagnosis not present

## 2021-07-22 DIAGNOSIS — I951 Orthostatic hypotension: Secondary | ICD-10-CM | POA: Diagnosis not present

## 2021-07-22 DIAGNOSIS — I130 Hypertensive heart and chronic kidney disease with heart failure and stage 1 through stage 4 chronic kidney disease, or unspecified chronic kidney disease: Secondary | ICD-10-CM | POA: Diagnosis not present

## 2021-07-22 DIAGNOSIS — N179 Acute kidney failure, unspecified: Secondary | ICD-10-CM | POA: Diagnosis not present

## 2021-07-22 DIAGNOSIS — I5043 Acute on chronic combined systolic (congestive) and diastolic (congestive) heart failure: Secondary | ICD-10-CM | POA: Diagnosis not present

## 2021-07-22 DIAGNOSIS — N1831 Chronic kidney disease, stage 3a: Secondary | ICD-10-CM | POA: Diagnosis not present

## 2021-07-24 IMAGING — RF DG ESOPHAGUS
5 series · 14 of 16 positions shown · non-contrast
Comparison: None.

CLINICAL DATA: Dysphagia, history of multiple sclerosis

EXAM:
ESOPHOGRAM/BARIUM SWALLOW
TECHNIQUE: Single contrast examination was performed using barium. The patient
was observed with fluoroscopy swallowing a 13 mm barium sulphate
tablet.
FLUOROSCOPY TIME:  Fluoroscopy Time:  1 minutes
Radiation Exposure Index (if provided by the fluoroscopic device): 8
mGy

[Series 1: cp_standard · 0.26mm/px · 3 of 66 frames shown (1 of 5)]
[frame 1/66]
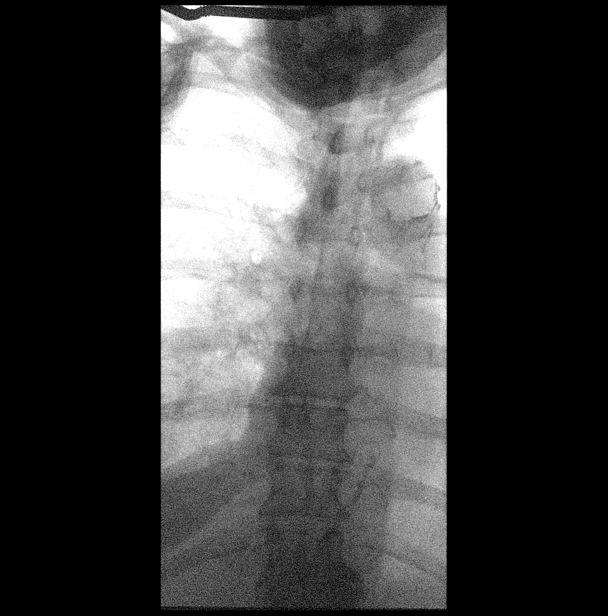
[frame 10/66]
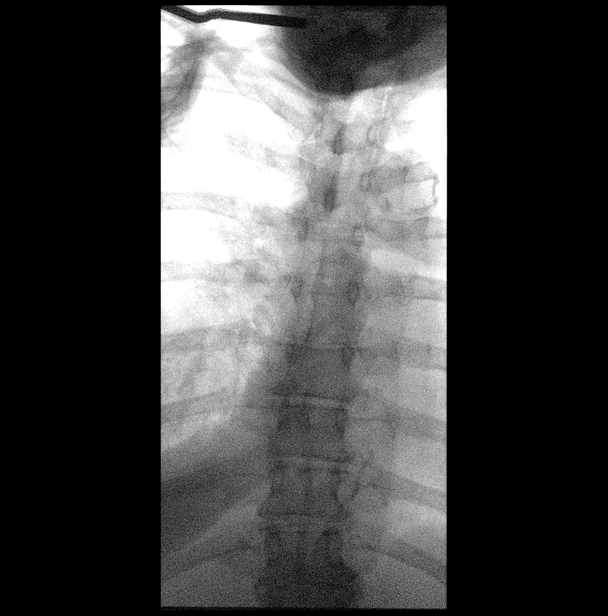
[frame 34/66]
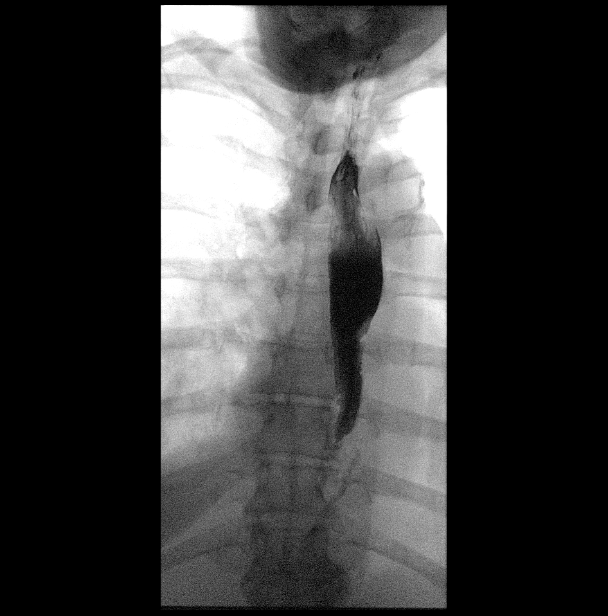

[Series 2: cp_standard · 0.26mm/px · 4 of 100 frames shown (2 of 5)]
[frame 16/100]
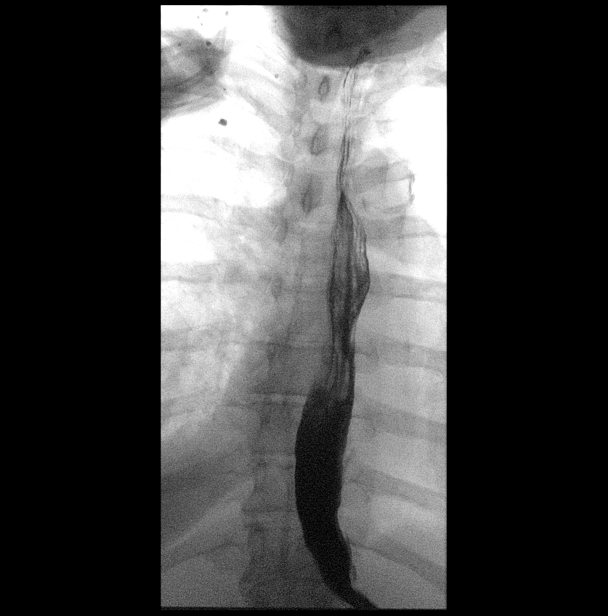
[frame 33/100]
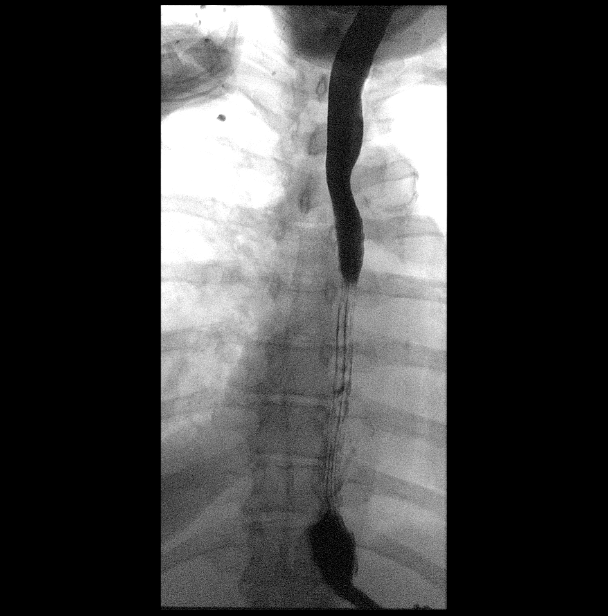
[frame 51/100]
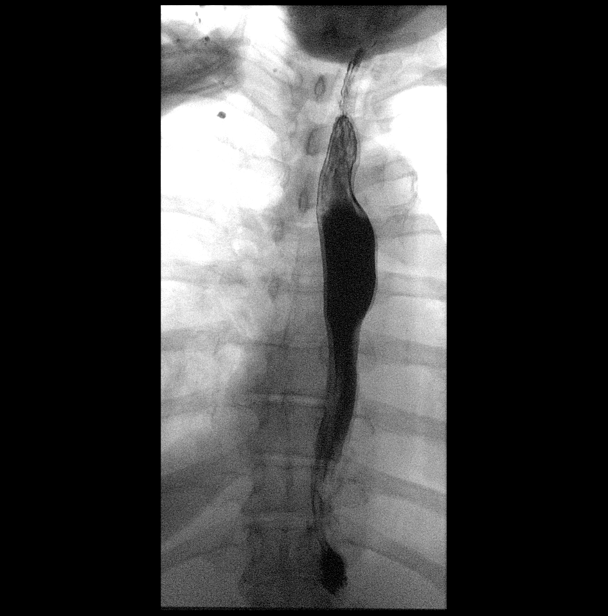
[frame 86/100]
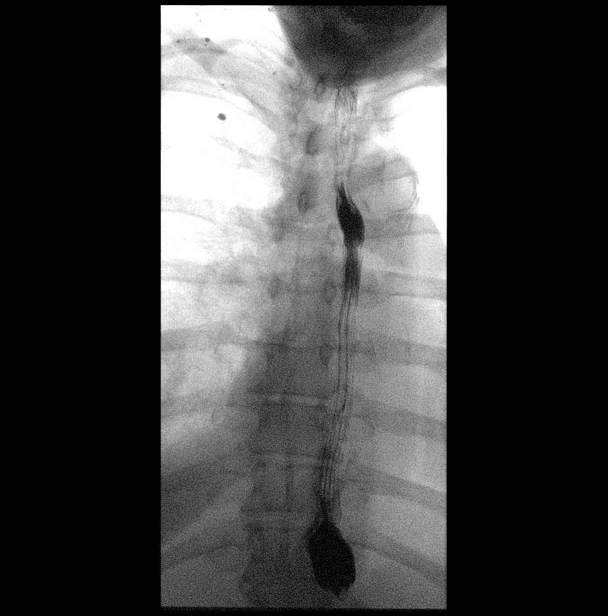

[Series 3: cp_standard · 0.26mm/px · 3 of 45 frames shown (3 of 5)]
[frame 7/45]
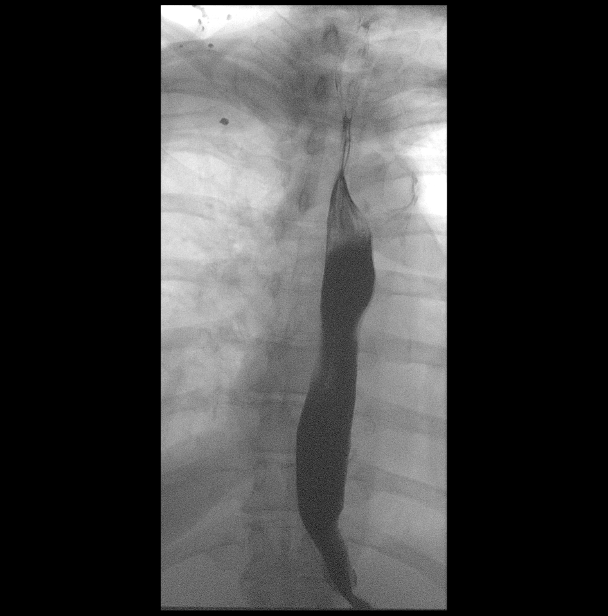
[frame 23/45]
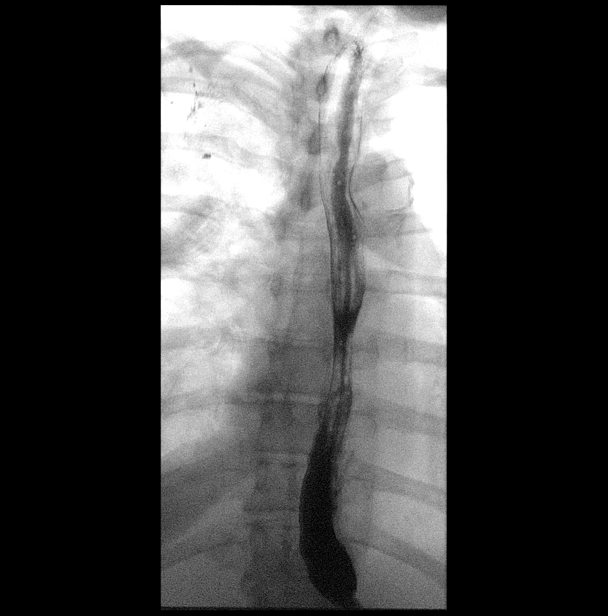
[frame 39/45]
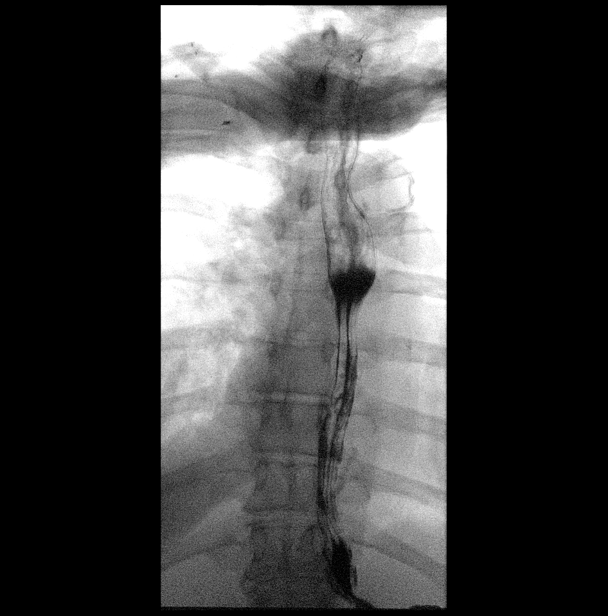

[Series 4: cp_standard · 0.27mm/px · 3 of 51 frames shown (4 of 5)]
[frame 20/51]
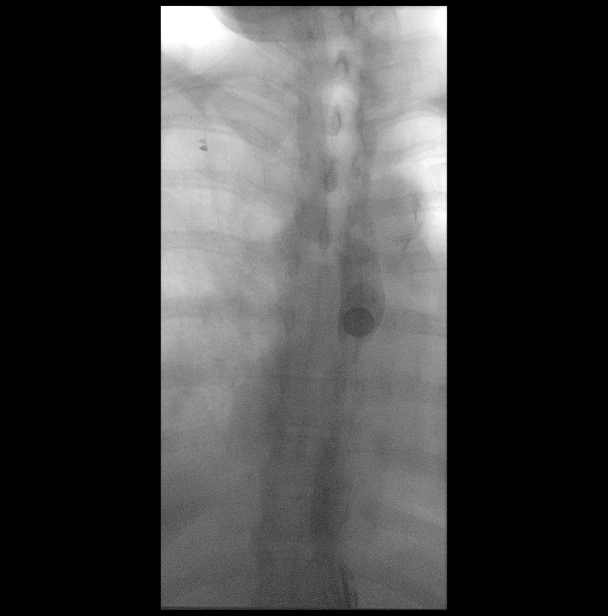
[frame 26/51]
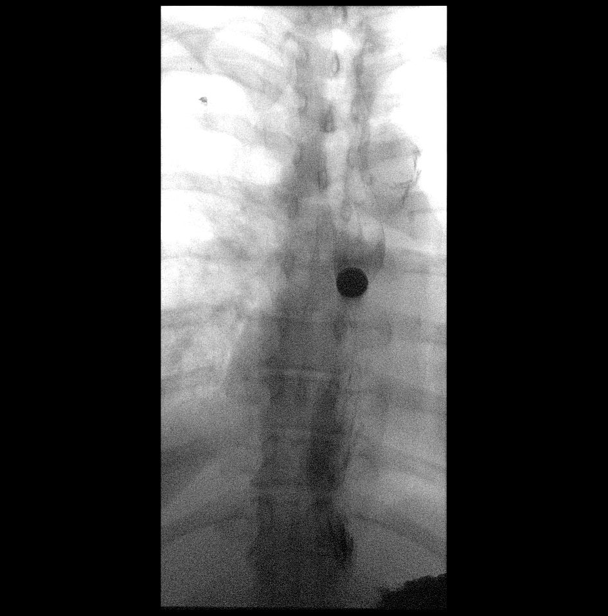
[frame 44/51]
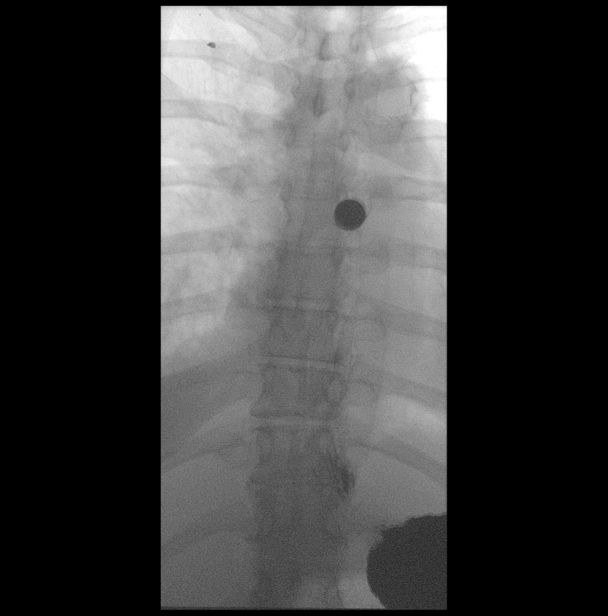

[Series 5: cp_standard · 0.27mm/px · 1 of 1 slices shown (5 of 5)]
[im 1/1]
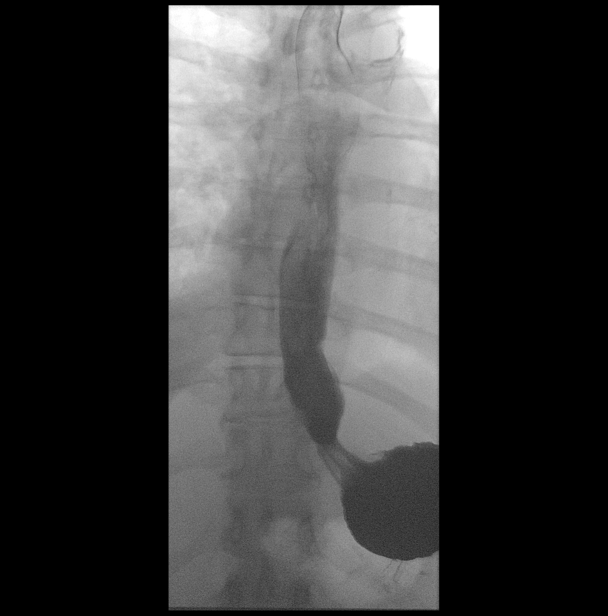

[14 of 16 positions shown; findings below may reference images not displayed]

FINDINGS: Technically limited as patient could not stand and study was
performed in a partially recumbent position. Patient swallowed
barium without difficulty. No aspiration. There is no mass,
stricture, or other abnormality identified. Normal motility. No
hiatal hernia. No evidence of spontaneous gastroesophageal reflux.
Barium pill initially held up at level of mid esophagus but cleared
with additional swallows water/contrast. Symptoms were not reliably
reproduced during this study.
IMPRESSION: No significant abnormality identified.

## 2021-07-25 ENCOUNTER — Ambulatory Visit: Payer: Medicare Other | Admitting: Family

## 2021-07-25 DIAGNOSIS — E860 Dehydration: Secondary | ICD-10-CM | POA: Diagnosis not present

## 2021-07-25 DIAGNOSIS — N179 Acute kidney failure, unspecified: Secondary | ICD-10-CM | POA: Diagnosis not present

## 2021-07-25 DIAGNOSIS — I5043 Acute on chronic combined systolic (congestive) and diastolic (congestive) heart failure: Secondary | ICD-10-CM | POA: Diagnosis not present

## 2021-07-25 DIAGNOSIS — N1831 Chronic kidney disease, stage 3a: Secondary | ICD-10-CM | POA: Diagnosis not present

## 2021-07-25 DIAGNOSIS — I951 Orthostatic hypotension: Secondary | ICD-10-CM | POA: Diagnosis not present

## 2021-07-25 DIAGNOSIS — I130 Hypertensive heart and chronic kidney disease with heart failure and stage 1 through stage 4 chronic kidney disease, or unspecified chronic kidney disease: Secondary | ICD-10-CM | POA: Diagnosis not present

## 2021-07-26 ENCOUNTER — Inpatient Hospital Stay: Payer: Medicare Other | Attending: Oncology

## 2021-07-26 DIAGNOSIS — N1831 Chronic kidney disease, stage 3a: Secondary | ICD-10-CM | POA: Insufficient documentation

## 2021-07-26 DIAGNOSIS — E538 Deficiency of other specified B group vitamins: Secondary | ICD-10-CM | POA: Insufficient documentation

## 2021-07-26 DIAGNOSIS — G35 Multiple sclerosis: Secondary | ICD-10-CM | POA: Insufficient documentation

## 2021-07-26 DIAGNOSIS — I255 Ischemic cardiomyopathy: Secondary | ICD-10-CM | POA: Diagnosis not present

## 2021-07-26 DIAGNOSIS — Z8249 Family history of ischemic heart disease and other diseases of the circulatory system: Secondary | ICD-10-CM | POA: Diagnosis not present

## 2021-07-26 DIAGNOSIS — Z885 Allergy status to narcotic agent status: Secondary | ICD-10-CM | POA: Diagnosis not present

## 2021-07-26 DIAGNOSIS — D509 Iron deficiency anemia, unspecified: Secondary | ICD-10-CM

## 2021-07-26 DIAGNOSIS — I2510 Atherosclerotic heart disease of native coronary artery without angina pectoris: Secondary | ICD-10-CM | POA: Insufficient documentation

## 2021-07-26 DIAGNOSIS — I5042 Chronic combined systolic (congestive) and diastolic (congestive) heart failure: Secondary | ICD-10-CM | POA: Diagnosis not present

## 2021-07-26 DIAGNOSIS — K219 Gastro-esophageal reflux disease without esophagitis: Secondary | ICD-10-CM | POA: Diagnosis not present

## 2021-07-26 DIAGNOSIS — D631 Anemia in chronic kidney disease: Secondary | ICD-10-CM | POA: Insufficient documentation

## 2021-07-26 DIAGNOSIS — Z8049 Family history of malignant neoplasm of other genital organs: Secondary | ICD-10-CM | POA: Insufficient documentation

## 2021-07-26 DIAGNOSIS — R5383 Other fatigue: Secondary | ICD-10-CM | POA: Diagnosis not present

## 2021-07-26 DIAGNOSIS — Z79899 Other long term (current) drug therapy: Secondary | ICD-10-CM | POA: Insufficient documentation

## 2021-07-26 DIAGNOSIS — J449 Chronic obstructive pulmonary disease, unspecified: Secondary | ICD-10-CM | POA: Diagnosis not present

## 2021-07-26 DIAGNOSIS — Z801 Family history of malignant neoplasm of trachea, bronchus and lung: Secondary | ICD-10-CM | POA: Insufficient documentation

## 2021-07-26 DIAGNOSIS — Z888 Allergy status to other drugs, medicaments and biological substances status: Secondary | ICD-10-CM | POA: Diagnosis not present

## 2021-07-26 DIAGNOSIS — Z87891 Personal history of nicotine dependence: Secondary | ICD-10-CM | POA: Insufficient documentation

## 2021-07-26 DIAGNOSIS — Z818 Family history of other mental and behavioral disorders: Secondary | ICD-10-CM | POA: Diagnosis not present

## 2021-07-26 DIAGNOSIS — Z808 Family history of malignant neoplasm of other organs or systems: Secondary | ICD-10-CM | POA: Insufficient documentation

## 2021-07-26 LAB — CBC WITH DIFFERENTIAL/PLATELET
Abs Immature Granulocytes: 0.02 10*3/uL (ref 0.00–0.07)
Basophils Absolute: 0.1 10*3/uL (ref 0.0–0.1)
Basophils Relative: 1 %
Eosinophils Absolute: 0.2 10*3/uL (ref 0.0–0.5)
Eosinophils Relative: 3 %
HCT: 32 % — ABNORMAL LOW (ref 36.0–46.0)
Hemoglobin: 9.5 g/dL — ABNORMAL LOW (ref 12.0–15.0)
Immature Granulocytes: 0 %
Lymphocytes Relative: 13 %
Lymphs Abs: 0.8 10*3/uL (ref 0.7–4.0)
MCH: 27.5 pg (ref 26.0–34.0)
MCHC: 29.7 g/dL — ABNORMAL LOW (ref 30.0–36.0)
MCV: 92.8 fL (ref 80.0–100.0)
Monocytes Absolute: 0.7 10*3/uL (ref 0.1–1.0)
Monocytes Relative: 11 %
Neutro Abs: 4.6 10*3/uL (ref 1.7–7.7)
Neutrophils Relative %: 72 %
Platelets: 195 10*3/uL (ref 150–400)
RBC: 3.45 MIL/uL — ABNORMAL LOW (ref 3.87–5.11)
RDW: 18 % — ABNORMAL HIGH (ref 11.5–15.5)
WBC: 6.4 10*3/uL (ref 4.0–10.5)
nRBC: 0 % (ref 0.0–0.2)

## 2021-07-26 LAB — FERRITIN: Ferritin: 65 ng/mL (ref 11–307)

## 2021-07-26 LAB — IRON AND TIBC
Iron: 69 ug/dL (ref 28–170)
Saturation Ratios: 19 % (ref 10.4–31.8)
TIBC: 358 ug/dL (ref 250–450)
UIBC: 289 ug/dL

## 2021-07-29 ENCOUNTER — Inpatient Hospital Stay (HOSPITAL_BASED_OUTPATIENT_CLINIC_OR_DEPARTMENT_OTHER): Payer: Medicare Other | Admitting: Oncology

## 2021-07-29 ENCOUNTER — Other Ambulatory Visit: Payer: Self-pay | Admitting: Primary Care

## 2021-07-29 ENCOUNTER — Encounter: Payer: Self-pay | Admitting: Oncology

## 2021-07-29 DIAGNOSIS — E538 Deficiency of other specified B group vitamins: Secondary | ICD-10-CM | POA: Diagnosis not present

## 2021-07-29 DIAGNOSIS — D631 Anemia in chronic kidney disease: Secondary | ICD-10-CM | POA: Diagnosis not present

## 2021-07-29 DIAGNOSIS — N1831 Chronic kidney disease, stage 3a: Secondary | ICD-10-CM | POA: Diagnosis not present

## 2021-07-29 DIAGNOSIS — R5383 Other fatigue: Secondary | ICD-10-CM | POA: Diagnosis not present

## 2021-07-29 DIAGNOSIS — K219 Gastro-esophageal reflux disease without esophagitis: Secondary | ICD-10-CM | POA: Diagnosis not present

## 2021-07-29 DIAGNOSIS — F5101 Primary insomnia: Secondary | ICD-10-CM

## 2021-07-29 DIAGNOSIS — J449 Chronic obstructive pulmonary disease, unspecified: Secondary | ICD-10-CM | POA: Diagnosis not present

## 2021-07-29 NOTE — Progress Notes (Signed)
HEMATOLOGY-ONCOLOGY TeleHEALTH VISIT PROGRESS NOTE  I connected with Anita [name] on 07/29/21 at  2:45 PM EDT by video enabled telemedicine visit and verified that I am speaking with the correct person using two identifiers. I discussed the limitations, risks, security and privacy concerns of performing an evaluation and management service by telemedicine and the availability of in-person appointments. I also discussed with the patient that there may be a patient responsible charge related to this service. The patient expressed understanding and agreed to proceed.   Other persons participating in the visit and their role in the encounter:  Daughter who helped her to set up virtual visit.   Patient's location: Home  Provider's location: office Chief Complaint: Follow-up for anemia   INTERVAL HISTORY Anita [name] is a 60 y.o. female who has above history reviewed by me today presents for follow up visit for anemia  Problems and complaints are listed below:  Patient reports feeling well today. No new complaints.  Chronic fatigue unchanged. She takes ferrous sulfate '325mg'$  BID.   Review of Systems  Constitutional:  Positive for fatigue. Negative for appetite change, chills and fever.  HENT:   Negative for hearing loss and voice change.   Eyes:  Negative for eye problems.  Respiratory:  Negative for chest tightness and cough.   Cardiovascular:  Negative for chest pain.  Gastrointestinal:  Negative for abdominal distention, abdominal pain and blood in stool.  Endocrine: Negative for hot flashes.  Genitourinary:  Negative for difficulty urinating and frequency.   Musculoskeletal:  Negative for arthralgias.  Skin:  Negative for itching and rash.  Neurological:  Negative for extremity weakness.  Hematological:  Negative for adenopathy.  Psychiatric/Behavioral:  Negative for confusion.    Past Medical History:  Diagnosis Date   Abnormality of gait 07/26/2013   Acute  respiratory failure with hypoxia ([place]) 03/20/2020   Allergy    Arthritis    Atypical pneumonia 03/22/2020   CAD (coronary artery disease)    a. 03/2020 Cath: LM nl, LAD 60/40m 55d, D1 70, D2 70, LCX 45ost/p, 65p, RCA 100ost CTO. RPDA fills via collats from dLAD. Inf septal fills via collats from 1st septal, RPAV fills via collats from LPAV, RPL1/2 sev dzs-->med rx.   Chickenpox    Chronic combined systolic (congestive) and diastolic (congestive) heart failure ([facility]    a. 02/2021 Echo: EF 40-45%, glob HK, gr2 DD. Sev red RV fxn, RVSP 69.22mg. Mod dil LA, sev dil RA. Sev MR. Mild to mod MS. Mean MV grad 6.59m36m. Mod-Sev TR. Mild AI. Mild Ao sclerosis w/o stenosis.   COPD (chronic obstructive pulmonary disease) (HCC)    Elevated troponin 03/22/2020   GERD (gastroesophageal reflux disease)    Headache(784.0)    Migraine   Hearing loss    Right ear secondary to infection   History of shingles    Ischemic cardiomyopathy    a. 02/2021 Echo: EF 40-45%, glob HK.   Migraines    Multiple sclerosis (HCC)    Obese    Optic neuritis    [place]H [place]) [place]  a. 02/2021 Echo: RVSP 69.2mm459m   Prolonged QT interval 03/20/2020   Severe mitral regurgitation    a. 02/2021 Echo: Severe MR w/ mild to mod MS.  Mean MV grad 6.59mmH63m  Past Surgical History:  Procedure Laterality Date   COLONOSCOPY WITH PROPOFOL N/A 08/03/2020   Procedure: COLONOSCOPY WITH PROPOFOL;  Surgeon: Anna,[name]  [facility];  Service: Gastroenterology;  Laterality: N/A;   Ganglioneuroma     Resection   LEFT HEART CATH AND CORONARY ANGIOGRAPHY N/A 03/23/2020   Procedure: LEFT HEART CATH AND CORONARY ANGIOGRAPHY;  Surgeon: Leonie Man, MD;  Location: [place] [facility];  Service: Cardiovascular;  Laterality: N/A;   PILONIDAL CYST EXCISION     PILONIDAL CYST EXCISION  1983   TUMOR REMOVAL  2007/2008    Family History  Problem Relation Age of Onset   Skin cancer Mother    Lung cancer  Father    Uterine cancer Sister    Heart disease Brother    Bipolar disorder Sister    Throat cancer Paternal Grandmother     Social History   Socioeconomic History   Marital status: Divorced    Spouse name: Not on file   Number of children: Not on file   Years of education: Not on file   Highest education level: Not on file  Occupational History   Not on file  Tobacco Use   Smoking status: Former    Packs/day: 1.00    Types: Cigarettes   Smokeless tobacco: Never  Vaping Use   Vaping Use: Never used  Substance and Sexual Activity   Alcohol use: Yes    Comment: Occasional   Drug use: Not on file   Sexual activity: Not on file  Other Topics Concern   Not on file  Social History Narrative   Not on file   Social Determinants of Health   Financial Resource Strain: Not on file  Food Insecurity: Not on file  Transportation Needs: Not on file  Physical Activity: Not on file  Stress: Not on file  Social Connections: Not on file  Intimate Partner Violence: Not on file    Current Outpatient Medications on File Prior to Visit  Medication Sig Dispense Refill   albuterol (VENTOLIN HFA) 108 (90 Base) MCG/ACT inhaler Inhale 1-2 puffs into the lungs every 6 (six) hours as needed for wheezing or shortness of breath. Use sparingly! 18 each 0   diclofenac Sodium (VOLTAREN) 1 % GEL Apply 2 g topically 4 (four) times daily as needed (back or joint pain). 50 g 1   Doxylamine Succinate, Sleep, (SLEEP AID PO) Take 1 tablet by mouth at bedtime as needed (sleep).      feeding supplement, ENSURE ENLIVE, (ENSURE ENLIVE) LIQD Take 237 mLs by mouth 2 (two) times daily between meals.     ferrous sulfate 325 (65 FE) MG EC tablet TAKE 1 TABLET BY MOUTH 2 TIMES DAILY WITH A MEAL. 180 tablet 1   Fluticasone-Salmeterol (ADVAIR DISKUS) 250-50 MCG/DOSE AEPB Inhale 1 puff into the lungs 2 (two) times daily. 1 each 3   midodrine (PROAMATINE) 2.5 MG tablet Take 1 tablet (2.5 mg total) by mouth 3 (three)  times daily with meals. 90 tablet 5   Multiple Vitamin (MULTIVITAMIN WITH MINERALS) TABS tablet Take 1 tablet by mouth daily.     omeprazole (PRILOSEC) 40 MG capsule Take 1 capsule (40 mg total) by mouth daily. 90 capsule 3   Probiotic Product (PROBIOTIC ADVANCED) CAPS Take 1 capsule by mouth daily.     torsemide (DEMADEX) 20 MG tablet Take only as needed.  Take for weight gain of 2 lbs in 1 day or 5 lbs in 1 week or obvious swelling. (Patient taking differently: Take only as needed.  Take for weight gain of 2 lbs in 1 day or 5 lbs in 1 week or obvious swelling.) 30 tablet 0  vitamin B-12 (CYANOCOBALAMIN) 1000 MCG tablet Take 1,000 mcg by mouth daily.     calcium carbonate (TUMS EX) 750 MG chewable tablet Chew 1 tablet by mouth daily as needed for heartburn. (Patient not taking: Reported on 07/29/2021)     cetirizine (ZYRTEC) 10 MG tablet Take 10 mg by mouth daily. (Patient not taking: No sig reported)     guaiFENesin (MUCINEX) 600 MG 12 hr tablet Take 600 mg by mouth 2 (two) times daily as needed for cough. (Patient not taking: Reported on 07/29/2021)     nitroGLYCERIN (NITROSTAT) 0.4 MG SL tablet Place 0.4 mg under the tongue every 5 (five) minutes as needed for chest pain. (Patient not taking: Reported on 07/29/2021)     SUMAtriptan (IMITREX) 25 MG tablet Take 1 tablet by mouth at migraine onset, may repeat in 2 hours if headache persists or recurs. Do not exceed 2 tablets in 24 hours. (Patient not taking: Reported on 07/29/2021) 10 tablet 0   No current facility-administered medications on file prior to visit.    Allergies  Allergen Reactions   Acthar Hp [Corticotropin] Other (See Comments)    Throat swelling and coughing up blood   Codeine    Prednisone        Observations/Objective: Today's Vitals   07/29/21 1342  PainSc: 0-No pain   There is no height or weight on file to calculate BMI.  Physical Exam Neurological:     Mental Status: She is alert.    CBC    Component Value  Date/Time   WBC 6.4 07/26/2021 1253   RBC 3.45 (L) 07/26/2021 1253   HGB 9.5 (L) 07/26/2021 1253   HCT 32.0 (L) 07/26/2021 1253   PLT 195 07/26/2021 1253   MCV 92.8 07/26/2021 1253   MCH 27.5 07/26/2021 1253   MCHC 29.7 (L) 07/26/2021 1253   RDW 18.0 (H) 07/26/2021 1253   LYMPHSABS 0.8 07/26/2021 1253   MONOABS 0.7 07/26/2021 1253   EOSABS 0.2 07/26/2021 1253   BASOSABS 0.1 07/26/2021 1253    CMP     Component Value Date/Time   NA 139 06/14/2021 1549   K 4.6 06/14/2021 1549   CL 102 06/14/2021 1549   CO2 26 06/14/2021 1549   GLUCOSE 87 06/14/2021 1549   BUN 17 06/14/2021 1549   CREATININE 1.13 (H) 06/14/2021 1549   CALCIUM 9.0 06/14/2021 1549   PROT 7.3 06/28/2021 0843   ALBUMIN 3.7 06/28/2021 0843   AST 17 06/28/2021 0843   ALT 14 06/28/2021 0843   ALKPHOS 120 (H) 06/28/2021 0843   BILITOT 0.6 06/28/2021 0843   GFRNONAA 56 (L) 06/11/2021 0503   GFRAA >60 08/14/2020 1150     Assessment and Plan: 1. Anemia in stage 3a chronic kidney disease ([place])   2. Vitamin B12 deficiency     Anemia, due to chronic kidney disease.  Labs are reviewed and discussed with patient. Hb has decreased to 9.5, comparing to 11 from last visit 6 months ago.  Iron panel showed normal ferritin and iron saturation.  Recommend to further increase ferritin with goal of 200.  Discussed about the rationale of IV venofer treatment and possible side effects. -previously discussed in details.  Patient agrees.I also call daughter for further discussion per her request.  Daughter has schedule conflicts with weekly IV venofer weekly x 3. She will be out of town soon.  Shared decision was made to have patient increase to ferrous sulfate 2-3 times per day, add vitamin C '500mg'$  daily - otc.  Short term follow up in 10 weeks for evaluation of need of IV venofer/retacrit.   Vitamin B12 deficiency, conitnue oral iron supplementation 1000 MCG daily.   Follow Up Instructions: 10 weeks   I discussed the  assessment and treatment plan with the patient. The patient was provided an opportunity to ask questions and all were answered. The patient agreed with the plan and demonstrated an understanding of the instructions.  The patient was advised to call back or seek an in-person evaluation if the symptoms worsen or if the condition fails to improve as anticipated.    Earlie Server, MD 07/29/2021 6:20 PM

## 2021-07-29 NOTE — Telephone Encounter (Signed)
It looks like her mirtazipine medication for insomnia was discontinued during her hospital stay in July 2022. Can we verify with her daughter that she is NOT taking this?   If not taking then okay to discontinue.

## 2021-07-30 ENCOUNTER — Telehealth: Payer: Self-pay | Admitting: Oncology

## 2021-07-30 DIAGNOSIS — E860 Dehydration: Secondary | ICD-10-CM | POA: Diagnosis not present

## 2021-07-30 DIAGNOSIS — I951 Orthostatic hypotension: Secondary | ICD-10-CM | POA: Diagnosis not present

## 2021-07-30 DIAGNOSIS — N179 Acute kidney failure, unspecified: Secondary | ICD-10-CM | POA: Diagnosis not present

## 2021-07-30 DIAGNOSIS — I130 Hypertensive heart and chronic kidney disease with heart failure and stage 1 through stage 4 chronic kidney disease, or unspecified chronic kidney disease: Secondary | ICD-10-CM | POA: Diagnosis not present

## 2021-07-30 DIAGNOSIS — I340 Nonrheumatic mitral (valve) insufficiency: Secondary | ICD-10-CM | POA: Diagnosis not present

## 2021-07-30 DIAGNOSIS — Z7982 Long term (current) use of aspirin: Secondary | ICD-10-CM | POA: Diagnosis not present

## 2021-07-30 DIAGNOSIS — I2510 Atherosclerotic heart disease of native coronary artery without angina pectoris: Secondary | ICD-10-CM | POA: Diagnosis not present

## 2021-07-30 DIAGNOSIS — I272 Pulmonary hypertension, unspecified: Secondary | ICD-10-CM | POA: Diagnosis not present

## 2021-07-30 DIAGNOSIS — I255 Ischemic cardiomyopathy: Secondary | ICD-10-CM | POA: Diagnosis not present

## 2021-07-30 DIAGNOSIS — Z87891 Personal history of nicotine dependence: Secondary | ICD-10-CM | POA: Diagnosis not present

## 2021-07-30 DIAGNOSIS — I5043 Acute on chronic combined systolic (congestive) and diastolic (congestive) heart failure: Secondary | ICD-10-CM | POA: Diagnosis not present

## 2021-07-30 DIAGNOSIS — N1831 Chronic kidney disease, stage 3a: Secondary | ICD-10-CM | POA: Diagnosis not present

## 2021-07-30 DIAGNOSIS — G35 Multiple sclerosis: Secondary | ICD-10-CM | POA: Diagnosis not present

## 2021-07-30 DIAGNOSIS — J449 Chronic obstructive pulmonary disease, unspecified: Secondary | ICD-10-CM | POA: Diagnosis not present

## 2021-07-30 NOTE — Telephone Encounter (Signed)
Patient's daughter called and asked if we could proceed with scheduling 2 additional iron infusions after her lab work on 10/07/21. She has multiple schedule conflicts and would like to "reserve" a chair in the event that her mom needs the iron so that she can request off work in time. Please advise.

## 2021-07-31 NOTE — Telephone Encounter (Signed)
Are you ok with setting up pt with additional venofer appts?

## 2021-07-31 NOTE — Telephone Encounter (Signed)
Please contact pt's daughter to scheduel 2 additional venofer (weekly) as requested and put in note " additional venofer scheduling requested by pt's daughter". We will cancel in the future if not needed.

## 2021-08-01 DIAGNOSIS — I951 Orthostatic hypotension: Secondary | ICD-10-CM | POA: Diagnosis not present

## 2021-08-01 DIAGNOSIS — I5043 Acute on chronic combined systolic (congestive) and diastolic (congestive) heart failure: Secondary | ICD-10-CM | POA: Diagnosis not present

## 2021-08-01 DIAGNOSIS — I130 Hypertensive heart and chronic kidney disease with heart failure and stage 1 through stage 4 chronic kidney disease, or unspecified chronic kidney disease: Secondary | ICD-10-CM | POA: Diagnosis not present

## 2021-08-01 DIAGNOSIS — N1831 Chronic kidney disease, stage 3a: Secondary | ICD-10-CM | POA: Diagnosis not present

## 2021-08-01 DIAGNOSIS — N179 Acute kidney failure, unspecified: Secondary | ICD-10-CM | POA: Diagnosis not present

## 2021-08-01 DIAGNOSIS — E860 Dehydration: Secondary | ICD-10-CM | POA: Diagnosis not present

## 2021-08-01 NOTE — Progress Notes (Signed)
Patient ID: Anita [name], female    DOB: [birth year], 60 y.o.   MRN: [id number]  HPI  Anita [name] is a 60 y/o female with a history of CAD, COPD, GERD, multiple sclerosis, pulmonary HTN, severe valvular disease, previous tobacco use and chronic heart failure.   Echo report from 02/07/21 reviewed and showed an EF of 40-45% along with severely elevated PA pressure of 69.2 mmHg, moderate LAE, severe MR, mild/ moderate Anita and moderate/severe TR.   LHC done 03/23/20 showed: Hemodynamics: LV end diastolic pressure is moderately elevated. ----Coronary Angiography----- Ost RCA to Dist RCA lesion is 100% stenosed.-The PDA and PL system fills via faint collaterals from the AV groove LCx and LAD septals. Mid LAD-1 lesion is 60% stenosed. Mid LAD-2 lesion is 50% stenosed. Dist LAD lesion is 55% stenosed with 70% stenosed side branch in 2nd Diag. 1st Diag lesion is 70% stenosed. Ost Cx to Prox Cx lesion is 45% stenosed. Prox-MID Cx lesion is 65% stenosed.   MODERATE-SEVERE THREE-VESSEL CAD: 100% proximal RCA occlusion with left-to-right collaterals faintly filling PDA and PL system (AV groove LCx-PL and LAD septal-PDA) Diffuse moderate mid LAD disease with 60% to 50% stenosis. Tandem 50% and 65% proximal and mid LCx with the 65% lesion being the most significant lesion. Moderately elevated LVEDP of 18-20 mmHg  Admitted 06/04/21 due to hypotension. Found to have AKI. Cardiology consult obtained. HF meds held and midodrine started. Gentle IVF given. Elevated LFT's noted & crestor & tylenol held. Discharged after 7 days. Admitted 05/18/21 due to acute on chronic HF. Initially given IV lasix with transition to oral diuretics with ~ 25L lost. Cardiology consult obtained. Discharged after 10 days.   She presents today for a follow-up visit with a chief complaint of moderate fatigue upon minimal exertion. She describes this as chronic in nature having been present for several years. She has associated  shortness of breath, difficulty sleeping, back pain, light-headedness and recent weight gain. She denies any abdominal distention, palpitations, pedal edema, chest pain or cough.   She says that she normally takes her diuretic every 1.5-2 weeks but did take her yesterday because she had gradually gained 4.7 pounds on her home scale.   Past Medical History:  Diagnosis Date   Abnormality of gait 07/26/2013   Acute respiratory failure with hypoxia ([place]) 03/20/2020   Allergy    Arthritis    Atypical pneumonia 03/22/2020   CAD (coronary artery disease)    a. 03/2020 Cath: LM nl, LAD 60/4m 55d, D1 70, D2 70, LCX 45ost/p, 65p, RCA 100ost CTO. RPDA fills via collats from dLAD. Inf septal fills via collats from 1st septal, RPAV fills via collats from LPAV, RPL1/2 sev dzs-->med rx.   Chickenpox    Chronic combined systolic (congestive) and diastolic (congestive) heart failure [place]    a. 02/2021 Echo: EF 40-45%, glob HK, gr2 DD. Sev red RV fxn, RVSP 69.266mg. Mod dil LA, sev dil RA. Sev MR. Mild to mod Anita. Mean MV grad 6.55m42m. Mod-Sev TR. Mild AI. Mild Ao sclerosis w/o stenosis.   COPD (chronic obstructive pulmonary disease) (HCC)    Elevated troponin 03/22/2020   GERD (gastroesophageal reflux disease)    Headache(784.0)    Migraine   Hearing loss    Right ear secondary to infection   History of shingles    Ischemic cardiomyopathy    a. 02/2021 Echo: EF 40-45%, glob HK.   Migraines    Multiple sclerosis (HCC)    Obese    Optic  neuritis    PAH (pulmonary artery hypertension) ([address])    a. 02/2021 Echo: RVSP 69.32mHg.   Prolonged QT interval 03/20/2020   Severe mitral regurgitation    a. 02/2021 Echo: Severe MR w/ mild to mod Anita.  Mean MV grad 6.566mg.   Past Surgical History:  Procedure Laterality Date   COLONOSCOPY WITH PROPOFOL N/A 08/03/2020   Procedure: COLONOSCOPY WITH PROPOFOL;  Surgeon: AnJonathon BellowsMD;  [facility];  Service: Gastroenterology;  Laterality: N/A;    Ganglioneuroma     Resection   LEFT HEART CATH AND CORONARY ANGIOGRAPHY N/A 03/23/2020   Procedure: LEFT HEART CATH AND CORONARY ANGIOGRAPHY;  Surgeon: HaLeonie ManMD;  [facility];  Service: Cardiovascular;  Laterality: N/A;   PILONIDAL CYST EXCISION     PILONIDAL CYST EXCISION  1983   TUMOR REMOVAL  2007/2008   Family History  Problem Relation Age of Onset   Skin cancer Mother    Lung cancer Father    Uterine cancer Sister    Heart disease Brother    Bipolar disorder Sister    Throat cancer Paternal Grandmother    Social History   Tobacco Use   Smoking status: Former    Packs/day: 1.00    Types: Cigarettes   Smokeless tobacco: Never  Substance Use Topics   Alcohol use: Yes    Comment: Occasional   Allergies  Allergen Reactions   Acthar Hp [Corticotropin] Other (See Comments)    Throat swelling and coughing up blood   Codeine    Prednisone    Prior to Admission medications   Medication Sig Start Date End Date Taking? Authorizing Provider  albuterol (VENTOLIN HFA) 108 (90 Base) MCG/ACT inhaler Inhale 1-2 puffs into the lungs every 6 (six) hours as needed for wheezing or shortness of breath. Use sparingly! 06/14/21  Yes ClPleas KochNP  calcium carbonate (TUMS EX) 750 MG chewable tablet Chew 1 tablet by mouth daily as needed for heartburn.   Yes [provider]  cetirizine (ZYRTEC) 10 MG tablet Take 10 mg by mouth daily.   Yes [provider]  diclofenac Sodium (VOLTAREN) 1 % GEL Apply 2 g topically 4 (four) times daily as needed (back or joint pain). 06/11/21  Yes PoElodia Florence MD  Doxylamine Succinate, Sleep, (SLEEP AID PO) Take 1 tablet by mouth at bedtime as needed (sleep).    Yes [provider]  feeding supplement, ENSURE ENLIVE, (ENSURE ENLIVE) LIQD Take 237 mLs by mouth 2 (two) times daily between meals. 03/28/20  Yes MaBarton DuboisMD  ferrous sulfate 325 (65 FE) MG EC tablet TAKE 1 TABLET BY MOUTH 2  TIMES DAILY WITH A MEAL. 05/28/21  Yes YuEarlie ServerMD  Fluticasone-Salmeterol (ADVAIR DISKUS) 250-50 MCG/DOSE AEPB Inhale 1 puff into the lungs 2 (two) times daily. 01/08/21  Yes ClPleas KochNP  guaiFENesin (MUCINEX) 600 MG 12 hr tablet Take 600 mg by mouth 2 (two) times daily as needed for cough.   Yes [provider]  midodrine (PROAMATINE) 2.5 MG tablet Take 1 tablet (2.5 mg total) by mouth 3 (three) times daily with meals. 07/18/21  Yes WaLoel DubonnetNP  Multiple Vitamin (MULTIVITAMIN WITH MINERALS) TABS tablet Take 1 tablet by mouth daily. 03/29/20  Yes MaBarton DuboisMD  nitroGLYCERIN (NITROSTAT) 0.4 MG SL tablet Place 1 tablet (0.4 mg total) under the tongue every 5 (five) minutes as needed for chest pain. 08/02/21  Yes HaAlisa GraffFNP  omeprazole (PRILOSEC) 40 MG capsule Take 1 capsule (40 mg total) by mouth daily. 03/04/21  Yes [name], MD  Probiotic Product (PROBIOTIC ADVANCED) CAPS Take 1 capsule by mouth daily.   Yes [provider]  SUMAtriptan (IMITREX) 25 MG tablet Take 1 tablet by mouth at migraine onset, may repeat in 2 hours if headache persists or recurs. Do not exceed 2 tablets in 24 hours. 02/15/18  Yes [name], NP  torsemide (DEMADEX) 20 MG tablet Take only as needed.  Take for weight gain of 2 lbs in 1 day or 5 lbs in 1 week or obvious swelling. Patient taking differently: Take only as needed.  Take for weight gain of 2 lbs in 1 day or 5 lbs in 1 week or obvious swelling. 06/11/21  Yes Elodia Florence., MD  vitamin B-12 (CYANOCOBALAMIN) 1000 MCG tablet Take 1,000 mcg by mouth daily.   Yes [provider]  ' Review of Systems  Constitutional:  Positive for fatigue (easily). Negative for appetite change.  HENT:  Negative for congestion, postnasal drip and sore throat.   Eyes: Negative.   Respiratory:  Positive for shortness of breath (improving). Negative for cough.   Cardiovascular:  Negative for chest pain, palpitations  and leg swelling.  Gastrointestinal:  Negative for abdominal distention and abdominal pain.  Endocrine: Negative.   Genitourinary: Negative.   Musculoskeletal:  Positive for back pain. Negative for neck pain.  Skin: Negative.   Allergic/Immunologic: Negative.   Neurological:  Positive for light-headedness ("all the time"). Negative for dizziness.  Hematological:  Negative for adenopathy. Does not bruise/bleed easily.  Psychiatric/Behavioral:  Positive for sleep disturbance (interrupted sleep pattern; sleeping on 2 pillows). Negative for dysphoric mood.    Vitals:   08/02/21 1305  BP: 135/90  Pulse: 96  Resp: 16  SpO2: 99%  Weight: 123 lb 4 oz (55.9 kg)  Height: '5\' 3"'$  (1.6 m)   Wt Readings from Last 3 Encounters:  08/02/21 123 lb 4 oz (55.9 kg)  06/28/21 116 lb (52.6 kg)  06/18/21 115 lb 2 oz (52.2 kg)   Lab Results  Component Value Date   CREATININE 1.13 (H) 06/14/2021   CREATININE 1.13 (H) 06/11/2021   CREATININE 1.14 (H) 06/10/2021   Physical Exam Vitals and nursing note reviewed. Exam conducted with a chaperone present (daughter).  Constitutional:      Appearance: Normal appearance.  HENT:     Head: Normocephalic and atraumatic.  Cardiovascular:     Rate and Rhythm: Normal rate and regular rhythm.     Heart sounds: Murmur heard.  Pulmonary:     Effort: Pulmonary effort is normal. No respiratory distress.     Breath sounds: No wheezing or rales.  Abdominal:     General: There is no distension.     Palpations: Abdomen is soft.     Tenderness: There is no abdominal tenderness.  Musculoskeletal:        General: No tenderness.     Right lower leg: No edema.     Left lower leg: No edema.  Skin:    General: Skin is warm and dry.  Neurological:     General: No focal deficit present.     Mental Status: She is alert and oriented to person, place, and time. Mental status is at baseline.  Psychiatric:        Mood and Affect: Mood normal.        Behavior: Behavior  normal.  Thought Content: Thought content normal.   Assessment & Plan:  1: Chronic heart failure with reduced ejection fraction- - NYHA class III - euvolemic today - weighing daily; reminded to call for an overnight weight gain of > 2 pounds or a weekly weight gain of > 5 pounds - she has gained 4.7# at home over the last week so took her diuretic yesterday (normally takes it every 1-2 weeks) - weight up 6 pounds from last visit here 6 weeks ago - not adding salt and daughter says that she doesn't cook with salt either - saw cardiology Gilford Rile) 06/28/21 - receiving PT at home twice/ week - midodrine has been decreased from 3 to 2.'5mg'$  TID and BP is maintaining - discussed slowly introducing GDMT but daughter is concerned because previously she was on numerous "BP" meds and patient became quite hypotensive - she would like to wait until midodrine is weaned off - discussed palliative care referral and patient and daughter are agreeable so a referral was made - they said that daughter is already POA and they've recently had forms notorized outlining her medical wishes for if her heart stops - BNP 06/04/21 was 930.0  2: HTN- - BP looks good today (135/90) - saw PCP Carlis Abbott) 06/14/21 - BMP 06/14/21 reviewed and showed sodium 139, potassium 4.6, creatinine 1.13   3: Valvular disease- - not a candidate for surgical mitral clip procedure  Medication bottles reviewed.   Return in 3 months or sooner for any questions/problems before then.

## 2021-08-02 ENCOUNTER — Encounter: Payer: Self-pay | Admitting: Family

## 2021-08-02 ENCOUNTER — Other Ambulatory Visit: Payer: Self-pay

## 2021-08-02 ENCOUNTER — Ambulatory Visit: Payer: Medicare Other | Attending: Family | Admitting: Family

## 2021-08-02 VITALS — BP 135/90 | HR 96 | Resp 16 | Ht 63.0 in | Wt 123.2 lb

## 2021-08-02 DIAGNOSIS — G35 Multiple sclerosis: Secondary | ICD-10-CM | POA: Insufficient documentation

## 2021-08-02 DIAGNOSIS — R0602 Shortness of breath: Secondary | ICD-10-CM | POA: Insufficient documentation

## 2021-08-02 DIAGNOSIS — I5042 Chronic combined systolic (congestive) and diastolic (congestive) heart failure: Secondary | ICD-10-CM | POA: Diagnosis not present

## 2021-08-02 DIAGNOSIS — I110 Hypertensive heart disease with heart failure: Secondary | ICD-10-CM | POA: Insufficient documentation

## 2021-08-02 DIAGNOSIS — Z8249 Family history of ischemic heart disease and other diseases of the circulatory system: Secondary | ICD-10-CM | POA: Diagnosis not present

## 2021-08-02 DIAGNOSIS — K219 Gastro-esophageal reflux disease without esophagitis: Secondary | ICD-10-CM | POA: Diagnosis not present

## 2021-08-02 DIAGNOSIS — Z09 Encounter for follow-up examination after completed treatment for conditions other than malignant neoplasm: Secondary | ICD-10-CM | POA: Diagnosis not present

## 2021-08-02 DIAGNOSIS — I2721 Secondary pulmonary arterial hypertension: Secondary | ICD-10-CM | POA: Diagnosis not present

## 2021-08-02 DIAGNOSIS — Z79899 Other long term (current) drug therapy: Secondary | ICD-10-CM | POA: Diagnosis not present

## 2021-08-02 DIAGNOSIS — Z87891 Personal history of nicotine dependence: Secondary | ICD-10-CM | POA: Insufficient documentation

## 2021-08-02 DIAGNOSIS — J449 Chronic obstructive pulmonary disease, unspecified: Secondary | ICD-10-CM | POA: Insufficient documentation

## 2021-08-02 DIAGNOSIS — I10 Essential (primary) hypertension: Secondary | ICD-10-CM

## 2021-08-02 DIAGNOSIS — I2510 Atherosclerotic heart disease of native coronary artery without angina pectoris: Secondary | ICD-10-CM | POA: Diagnosis not present

## 2021-08-02 DIAGNOSIS — R42 Dizziness and giddiness: Secondary | ICD-10-CM | POA: Insufficient documentation

## 2021-08-02 DIAGNOSIS — I340 Nonrheumatic mitral (valve) insufficiency: Secondary | ICD-10-CM

## 2021-08-02 DIAGNOSIS — Z7951 Long term (current) use of inhaled steroids: Secondary | ICD-10-CM | POA: Diagnosis not present

## 2021-08-02 DIAGNOSIS — M549 Dorsalgia, unspecified: Secondary | ICD-10-CM | POA: Insufficient documentation

## 2021-08-02 MED ORDER — NITROGLYCERIN 0.4 MG SL SUBL: 0.4000 mg | SUBLINGUAL_TABLET | SUBLINGUAL | 5 refills | Status: AC | PRN

## 2021-08-02 NOTE — Patient Instructions (Signed)
Continue weighing daily and call for an overnight weight gain of > 2 pounds or a weekly weight gain of >5 pounds. 

## 2021-08-03 ENCOUNTER — Encounter: Payer: Self-pay | Admitting: Family

## 2021-08-07 DIAGNOSIS — N179 Acute kidney failure, unspecified: Secondary | ICD-10-CM | POA: Diagnosis not present

## 2021-08-07 DIAGNOSIS — I5043 Acute on chronic combined systolic (congestive) and diastolic (congestive) heart failure: Secondary | ICD-10-CM | POA: Diagnosis not present

## 2021-08-07 DIAGNOSIS — I951 Orthostatic hypotension: Secondary | ICD-10-CM | POA: Diagnosis not present

## 2021-08-07 DIAGNOSIS — E860 Dehydration: Secondary | ICD-10-CM | POA: Diagnosis not present

## 2021-08-07 DIAGNOSIS — I130 Hypertensive heart and chronic kidney disease with heart failure and stage 1 through stage 4 chronic kidney disease, or unspecified chronic kidney disease: Secondary | ICD-10-CM | POA: Diagnosis not present

## 2021-08-07 DIAGNOSIS — N1831 Chronic kidney disease, stage 3a: Secondary | ICD-10-CM | POA: Diagnosis not present

## 2021-08-08 ENCOUNTER — Telehealth: Payer: Self-pay | Admitting: Nurse Practitioner

## 2021-08-08 NOTE — Telephone Encounter (Signed)
Spoke with patient's daughter, [name], regarding the Palliative referral and she said that was currently out of town and would call me back next week, contact information provided to daughter.

## 2021-08-09 DIAGNOSIS — I951 Orthostatic hypotension: Secondary | ICD-10-CM | POA: Diagnosis not present

## 2021-08-09 DIAGNOSIS — I5043 Acute on chronic combined systolic (congestive) and diastolic (congestive) heart failure: Secondary | ICD-10-CM | POA: Diagnosis not present

## 2021-08-09 DIAGNOSIS — N1831 Chronic kidney disease, stage 3a: Secondary | ICD-10-CM | POA: Diagnosis not present

## 2021-08-09 DIAGNOSIS — I130 Hypertensive heart and chronic kidney disease with heart failure and stage 1 through stage 4 chronic kidney disease, or unspecified chronic kidney disease: Secondary | ICD-10-CM | POA: Diagnosis not present

## 2021-08-09 DIAGNOSIS — N179 Acute kidney failure, unspecified: Secondary | ICD-10-CM | POA: Diagnosis not present

## 2021-08-09 DIAGNOSIS — E860 Dehydration: Secondary | ICD-10-CM | POA: Diagnosis not present

## 2021-08-13 DIAGNOSIS — E860 Dehydration: Secondary | ICD-10-CM | POA: Diagnosis not present

## 2021-08-13 DIAGNOSIS — I130 Hypertensive heart and chronic kidney disease with heart failure and stage 1 through stage 4 chronic kidney disease, or unspecified chronic kidney disease: Secondary | ICD-10-CM | POA: Diagnosis not present

## 2021-08-13 DIAGNOSIS — I5043 Acute on chronic combined systolic (congestive) and diastolic (congestive) heart failure: Secondary | ICD-10-CM | POA: Diagnosis not present

## 2021-08-13 DIAGNOSIS — N179 Acute kidney failure, unspecified: Secondary | ICD-10-CM | POA: Diagnosis not present

## 2021-08-13 DIAGNOSIS — N1831 Chronic kidney disease, stage 3a: Secondary | ICD-10-CM | POA: Diagnosis not present

## 2021-08-13 DIAGNOSIS — I951 Orthostatic hypotension: Secondary | ICD-10-CM | POA: Diagnosis not present

## 2021-08-14 DIAGNOSIS — I130 Hypertensive heart and chronic kidney disease with heart failure and stage 1 through stage 4 chronic kidney disease, or unspecified chronic kidney disease: Secondary | ICD-10-CM | POA: Diagnosis not present

## 2021-08-14 DIAGNOSIS — I951 Orthostatic hypotension: Secondary | ICD-10-CM | POA: Diagnosis not present

## 2021-08-14 DIAGNOSIS — I5043 Acute on chronic combined systolic (congestive) and diastolic (congestive) heart failure: Secondary | ICD-10-CM | POA: Diagnosis not present

## 2021-08-14 DIAGNOSIS — E860 Dehydration: Secondary | ICD-10-CM | POA: Diagnosis not present

## 2021-08-14 DIAGNOSIS — N179 Acute kidney failure, unspecified: Secondary | ICD-10-CM | POA: Diagnosis not present

## 2021-08-14 DIAGNOSIS — N1831 Chronic kidney disease, stage 3a: Secondary | ICD-10-CM | POA: Diagnosis not present

## 2021-08-16 ENCOUNTER — Telehealth: Payer: Self-pay | Admitting: Nurse Practitioner

## 2021-08-16 NOTE — Telephone Encounter (Signed)
Attempted to contact daughter to see if they wanted to schedule a Palliative Consult, no answer - left message requesting a return call.

## 2021-08-19 ENCOUNTER — Telehealth: Payer: Self-pay | Admitting: Nurse Practitioner

## 2021-08-19 NOTE — Telephone Encounter (Signed)
Returned call to daughter, [name], and spoke with her regarding the Palliative referral/services and all questions were answered and she was in agreement with scheduling visit.  I have scheduled an In-home Consult for 09/03/21 @ 9 AM

## 2021-08-22 ENCOUNTER — Encounter: Payer: Self-pay | Admitting: Oncology

## 2021-08-22 DIAGNOSIS — N179 Acute kidney failure, unspecified: Secondary | ICD-10-CM | POA: Diagnosis not present

## 2021-08-22 DIAGNOSIS — I5043 Acute on chronic combined systolic (congestive) and diastolic (congestive) heart failure: Secondary | ICD-10-CM | POA: Diagnosis not present

## 2021-08-22 DIAGNOSIS — I130 Hypertensive heart and chronic kidney disease with heart failure and stage 1 through stage 4 chronic kidney disease, or unspecified chronic kidney disease: Secondary | ICD-10-CM | POA: Diagnosis not present

## 2021-08-22 DIAGNOSIS — N1831 Chronic kidney disease, stage 3a: Secondary | ICD-10-CM | POA: Diagnosis not present

## 2021-08-22 DIAGNOSIS — E860 Dehydration: Secondary | ICD-10-CM | POA: Diagnosis not present

## 2021-08-22 DIAGNOSIS — I951 Orthostatic hypotension: Secondary | ICD-10-CM | POA: Diagnosis not present

## 2021-08-26 DIAGNOSIS — N179 Acute kidney failure, unspecified: Secondary | ICD-10-CM | POA: Diagnosis not present

## 2021-08-26 DIAGNOSIS — E860 Dehydration: Secondary | ICD-10-CM | POA: Diagnosis not present

## 2021-08-26 DIAGNOSIS — I5043 Acute on chronic combined systolic (congestive) and diastolic (congestive) heart failure: Secondary | ICD-10-CM | POA: Diagnosis not present

## 2021-08-26 DIAGNOSIS — N1831 Chronic kidney disease, stage 3a: Secondary | ICD-10-CM | POA: Diagnosis not present

## 2021-08-26 DIAGNOSIS — I130 Hypertensive heart and chronic kidney disease with heart failure and stage 1 through stage 4 chronic kidney disease, or unspecified chronic kidney disease: Secondary | ICD-10-CM | POA: Diagnosis not present

## 2021-08-26 DIAGNOSIS — I951 Orthostatic hypotension: Secondary | ICD-10-CM | POA: Diagnosis not present

## 2021-08-29 ENCOUNTER — Other Ambulatory Visit: Payer: Medicare Other | Admitting: Nurse Practitioner

## 2021-08-29 ENCOUNTER — Encounter: Payer: Self-pay | Admitting: Nurse Practitioner

## 2021-08-29 ENCOUNTER — Other Ambulatory Visit: Payer: Self-pay

## 2021-08-29 DIAGNOSIS — I5043 Acute on chronic combined systolic (congestive) and diastolic (congestive) heart failure: Secondary | ICD-10-CM | POA: Diagnosis not present

## 2021-08-29 DIAGNOSIS — N1831 Chronic kidney disease, stage 3a: Secondary | ICD-10-CM | POA: Diagnosis not present

## 2021-08-29 DIAGNOSIS — I130 Hypertensive heart and chronic kidney disease with heart failure and stage 1 through stage 4 chronic kidney disease, or unspecified chronic kidney disease: Secondary | ICD-10-CM | POA: Diagnosis not present

## 2021-08-29 DIAGNOSIS — I340 Nonrheumatic mitral (valve) insufficiency: Secondary | ICD-10-CM | POA: Diagnosis not present

## 2021-08-29 DIAGNOSIS — R0602 Shortness of breath: Secondary | ICD-10-CM

## 2021-08-29 DIAGNOSIS — Z515 Encounter for palliative care: Secondary | ICD-10-CM

## 2021-08-29 DIAGNOSIS — I509 Heart failure, unspecified: Secondary | ICD-10-CM

## 2021-08-29 DIAGNOSIS — I255 Ischemic cardiomyopathy: Secondary | ICD-10-CM | POA: Diagnosis not present

## 2021-08-29 DIAGNOSIS — E860 Dehydration: Secondary | ICD-10-CM | POA: Diagnosis not present

## 2021-08-29 DIAGNOSIS — I272 Pulmonary hypertension, unspecified: Secondary | ICD-10-CM | POA: Diagnosis not present

## 2021-08-29 DIAGNOSIS — I951 Orthostatic hypotension: Secondary | ICD-10-CM | POA: Diagnosis not present

## 2021-08-29 DIAGNOSIS — I2510 Atherosclerotic heart disease of native coronary artery without angina pectoris: Secondary | ICD-10-CM | POA: Diagnosis not present

## 2021-08-29 DIAGNOSIS — J449 Chronic obstructive pulmonary disease, unspecified: Secondary | ICD-10-CM | POA: Diagnosis not present

## 2021-08-29 DIAGNOSIS — G35 Multiple sclerosis: Secondary | ICD-10-CM | POA: Diagnosis not present

## 2021-08-29 DIAGNOSIS — N179 Acute kidney failure, unspecified: Secondary | ICD-10-CM | POA: Diagnosis not present

## 2021-08-29 DIAGNOSIS — Z7982 Long term (current) use of aspirin: Secondary | ICD-10-CM | POA: Diagnosis not present

## 2021-08-29 DIAGNOSIS — Z87891 Personal history of nicotine dependence: Secondary | ICD-10-CM | POA: Diagnosis not present

## 2021-08-29 NOTE — Progress Notes (Addendum)
[facility] Palliative Care Consult Note Telephone: [phone]  Fax: [phone]   Date of encounter: 08/29/21 9:20 PM PATIENT NAME: Anita [name] [address] [place] [address]   [phone] (home)  DOB: 10-01-1961 MRN: [id number] PRIMARY CARE PROVIDER:    [name], NP,  [address] [place] [address] [phone]  RESPONSIBLE PARTY:    Contact Information     Name Relation Home Work Mobile   Fernando Salinas Daughter [phone]        I met face to face with patient in home. Palliative Care was asked to follow this patient by consultation request of  [name], NP to address advance care planning and complex medical decision making. This is the initial visit.                              ASSESSMENT AND PLAN / RECOMMENDATIONS:  Symptom Management/Plan: 1. Advance Care Planning; Full code, will continue to discussions  2. Shortness of breath secondary severe mitral regurgitation, heart failure not surgical candidate   3. Goals of Care: Goals include to maximize quality of life and symptom management. Our advance care planning conversation included a discussion about:    The value and importance of advance care planning  Exploration of personal, cultural or spiritual beliefs that might influence medical decisions  Exploration of goals of care in the event of a sudden injury or illness  Identification and preparation of a healthcare agent  Review and updating or creation of an advance directive document.  4. Palliative care encounter; Palliative care encounter; Palliative medicine team will continue to support patient, patient's family, and medical team. Visit consisted of counseling and education dealing with the complex and emotionally intense issues of symptom management and palliative care in the setting of serious and potentially life-threatening illness  Follow up Palliative Care Visit:  Palliative care will continue to follow for complex medical decision making, advance care planning, and clarification of goals. Return 6 weeks or prn.  I spent 67 minutes providing this consultation. More than 50% of the time in this consultation was spent in counseling and care coordination.  PPS: 40%  Chief Complaint: Initial Palliative consult for complex medial decision making  HISTORY OF PRESENT ILLNESS:  SRIJA SOUTHARD is a 60 y.o. year old female  with multiple medical problems including hx of MS, CAD, severe MR with moderate mitral stenosis, COPD, tobacco use, systolic and diastolic heart failure. I called [name] daughter [name] to confirm PC initial consult, in agreement with covid negative screening. I visited [name] in her home, she was in her bedroom, sitting on the bed, preferred place to do PC visit. We talked about the last time she was independent living on her own. We talked about live review including she is divorced, one daughter with whom she lives with. We talked about past medical history, chronic disease. We talked about MS, progression with CHF. We talked about symptoms including shortness of breath, pain, tachycardia. [name] endorses at times her heart rate is 140 to 170's, then returns to 80 - 90. We talked about option of holter monitor, will f/u with T.Hackney NP. [name] endorses this has happened twice since last hospitalization. [name] endorses it just stops, she does become anxious during that time. [name] endorses she no other symptoms at that time. We talked about last hospitalization. We talked  about sleep, sleep patterns, sleep hygiene. We talked about her appetite, weight, edema. We talked about medical goals of care. We talked about progression of MS with functional decline. [name] endorses she is ambulatory with her walker, no recent falls. We talked about her swallowing, speech. Ms Mahurin endorses her hope is  to stay with her daughter. We talked about f/u pc visit, [name] in agreement. Scheduled. Therapeutic listening, emotional support provided. Questions answered. I will f/u with Jackelyn Hoehn NP concerning tachycardia, see if she wishes to place a holter monitor.   History obtained from review of EMR, discussion with  Ms. Muscarella.  I reviewed available labs, medications, imaging, studies and related documents from the EMR.  Records reviewed and summarized above.   ROS Full 14 system review of systems performed and negative with exception of: as per HPI.   Physical Exam: Constitutional: NAD General: frail appearing, chronically ill, pleasant female EYES: lids intact ENMT: oral mucous membranes moist CV: S1S2, RRR, no LE edema Pulmonary: LCTA, no increased work of breathing, no cough, room air Abdomen: soft and non tender MSK: ambulatory with walker Skin: warm and dry Neuro:  + generalized weakness,  no cognitive impairment Psych: non-anxious affect, A and O x 3  CURRENT PROBLEM LIST:  Patient Active Problem List   Diagnosis Date Noted   Dehydration    Elevated liver enzymes    Mitral valve insufficiency 05/19/2021   UTI (urinary tract infection) 05/19/2021   CHF (congestive heart failure) ([place]) 05/19/2021   Hypoxia 05/19/2021   Anasarca 05/19/2021   Esophageal dysphagia 01/08/2021   Ischemic cardiomyopathy 10/08/2020   Acute on chronic combined systolic and diastolic CHF (congestive heart failure) ([place]) 10/08/2020   Coronary artery disease involving native coronary artery of native heart without angina pectoris 10/08/2020   IDA (iron deficiency anemia) 10/05/2020   Vitamin B12 deficiency 08/23/2020   COPD (chronic obstructive pulmonary disease) ([place]) 06/22/2020   Tobacco abuse 03/22/2020   Pulmonary hypertension ([place]) 03/22/2020   Retroperitoneal mass 03/22/2020   Insomnia 03/22/2020   NSTEMI (non-ST elevated myocardial infarction) ([place]) 03/20/2020   SIRS (systemic  inflammatory response syndrome) ([place]) 03/20/2020   Normocytic anemia 03/20/2020   AKI (acute kidney injury) ([place]) 03/20/2020   Gastroesophageal reflux disease 02/15/2018   Chronic migraine without aura with status migrainosus, not intractable 02/15/2018   Osteoarthritis 02/15/2018   Essential hypertension 02/15/2018   Disturbance of skin sensation 03/26/2012   Multiple sclerosis ([place]) 03/26/2012   Acute back pain 03/26/2012   PAST MEDICAL HISTORY:  Active Ambulatory Problems    Diagnosis Date Noted   Disturbance of skin sensation 03/26/2012   Multiple sclerosis ([place]) 03/26/2012   Acute back pain 03/26/2012   Gastroesophageal reflux disease 02/15/2018   Chronic migraine without aura with status migrainosus, not intractable 02/15/2018   Osteoarthritis 02/15/2018   Essential hypertension 02/15/2018   NSTEMI (non-ST elevated myocardial infarction) ([place]) 03/20/2020   SIRS (systemic inflammatory response syndrome) ([place]) 03/20/2020   Normocytic anemia 03/20/2020   AKI (acute kidney injury) ([place]) 03/20/2020   Tobacco abuse 03/22/2020   Pulmonary hypertension ([place]) 03/22/2020   Retroperitoneal mass 03/22/2020   Insomnia 03/22/2020   COPD (chronic obstructive pulmonary disease) ([place]) 06/22/2020   Vitamin B12 deficiency 08/23/2020   IDA (iron deficiency anemia) 10/05/2020   Ischemic cardiomyopathy 10/08/2020   Acute on chronic combined systolic and diastolic CHF (congestive heart failure) ([place]) 10/08/2020   Coronary artery disease involving native coronary artery of native heart without angina pectoris 10/08/2020  Esophageal dysphagia 01/08/2021   Mitral valve insufficiency 05/19/2021   UTI (urinary tract infection) 05/19/2021   CHF (congestive heart failure) ([place]) 05/19/2021   Hypoxia 05/19/2021   Anasarca 05/19/2021   Elevated liver enzymes    Dehydration    Resolved Ambulatory Problems    Diagnosis Date Noted   TOBACCO ABUSE 01/06/2011   Precordial pain 01/06/2011   Encounter  for therapeutic drug monitoring 02/19/2012   Optic neuritis, unspecified 03/26/2012   Pain in limb 03/26/2012   Abnormality of gait 07/26/2013   Acute respiratory failure with hypoxia ([place]) 03/20/2020   Prolonged QT interval 03/20/2020   Pneumonia 03/20/2020   Acute respiratory distress 03/20/2020   Respiratory distress 03/20/2020   Atypical pneumonia 03/22/2020   Elevated troponin 03/22/2020   Acute on chronic HFrEF (heart failure with reduced ejection fraction) ([place]) 03/22/2020   Iron deficiency anemia 08/23/2020   Past Medical History:  Diagnosis Date   Allergy    Arthritis    CAD (coronary artery disease)    Chickenpox    Chronic combined systolic (congestive) and diastolic (congestive) heart failure (HCC)    GERD (gastroesophageal reflux disease)    Headache(784.0)    Hearing loss    History of shingles    Migraines    Obese    Optic neuritis    PAH (pulmonary artery hypertension) (HCC)    Severe mitral regurgitation    SOCIAL HX:  Social History   Tobacco Use   Smoking status: Former    Packs/day: 1.00    Types: Cigarettes   Smokeless tobacco: Never  Substance Use Topics   Alcohol use: Yes    Comment: Occasional   FAMILY HX:  Family History  Problem Relation Age of Onset   Skin cancer Mother    Lung cancer Father    Uterine cancer Sister    Heart disease Brother    Bipolar disorder Sister    Throat cancer Paternal Grandmother     reviewed  ALLERGIES:  Allergies  Allergen Reactions   Acthar Hp [Corticotropin] Other (See Comments)    Throat swelling and coughing up blood   Codeine    Prednisone      PERTINENT MEDICATIONS:  Outpatient Encounter Medications as of 08/29/2021  Medication Sig   albuterol (VENTOLIN HFA) 108 (90 Base) MCG/ACT inhaler Inhale 1-2 puffs into the lungs every 6 (six) hours as needed for wheezing or shortness of breath. Use sparingly!   calcium carbonate (TUMS EX) 750 MG chewable tablet Chew 1 tablet by mouth daily as needed  for heartburn.   cetirizine (ZYRTEC) 10 MG tablet Take 10 mg by mouth daily.   diclofenac Sodium (VOLTAREN) 1 % GEL Apply 2 g topically 4 (four) times daily as needed (back or joint pain).   Doxylamine Succinate, Sleep, (SLEEP AID PO) Take 1 tablet by mouth at bedtime as needed (sleep).    feeding supplement, ENSURE ENLIVE, (ENSURE ENLIVE) LIQD Take 237 mLs by mouth 2 (two) times daily between meals.   ferrous sulfate 325 (65 FE) MG EC tablet TAKE 1 TABLET BY MOUTH 2 TIMES DAILY WITH A MEAL.   Fluticasone-Salmeterol (ADVAIR DISKUS) 250-50 MCG/DOSE AEPB Inhale 1 puff into the lungs 2 (two) times daily.   guaiFENesin (MUCINEX) 600 MG 12 hr tablet Take 600 mg by mouth 2 (two) times daily as needed for cough.   midodrine (PROAMATINE) 2.5 MG tablet Take 1 tablet (2.5 mg total) by mouth 3 (three) times daily with meals.   Multiple Vitamin (MULTIVITAMIN WITH MINERALS)  TABS tablet Take 1 tablet by mouth daily.   nitroGLYCERIN (NITROSTAT) 0.4 MG SL tablet Place 1 tablet (0.4 mg total) under the tongue every 5 (five) minutes as needed for chest pain.   omeprazole (PRILOSEC) 40 MG capsule Take 1 capsule (40 mg total) by mouth daily.   Probiotic Product (PROBIOTIC ADVANCED) CAPS Take 1 capsule by mouth daily.   SUMAtriptan (IMITREX) 25 MG tablet Take 1 tablet by mouth at migraine onset, may repeat in 2 hours if headache persists or recurs. Do not exceed 2 tablets in 24 hours.   torsemide (DEMADEX) 20 MG tablet Take only as needed.  Take for weight gain of 2 lbs in 1 day or 5 lbs in 1 week or obvious swelling. (Patient taking differently: Take only as needed.  Take for weight gain of 2 lbs in 1 day or 5 lbs in 1 week or obvious swelling.)   vitamin B-12 (CYANOCOBALAMIN) 1000 MCG tablet Take 1,000 mcg by mouth daily.   No facility-administered encounter medications on file as of 08/29/2021.  Questions and concerns were addressed. The patient was encouraged to call with questions and/or concerns. My business card  was provided. Provided general support and encouragement, no other unmet needs identified   Thank you for the opportunity to participate in the care of Ms. Fitterer.  The palliative care team will continue to follow. Please call our office at [phone] if we can be of additional assistance.   This chart was dictated using voice recognition software.  Despite best efforts to proofread,  errors can occur which can change the documentation meaning.   Treonna Klee Z Lauralei Clouse, NP ,   COVID-19 PATIENT SCREENING TOOL Asked and negative response unless otherwise noted:  Have you had symptoms of covid, tested positive or been in contact with someone with symptoms/positive test in the past 5-10 days? NO

## 2021-09-02 DIAGNOSIS — I5043 Acute on chronic combined systolic (congestive) and diastolic (congestive) heart failure: Secondary | ICD-10-CM | POA: Diagnosis not present

## 2021-09-02 DIAGNOSIS — N179 Acute kidney failure, unspecified: Secondary | ICD-10-CM | POA: Diagnosis not present

## 2021-09-02 DIAGNOSIS — I130 Hypertensive heart and chronic kidney disease with heart failure and stage 1 through stage 4 chronic kidney disease, or unspecified chronic kidney disease: Secondary | ICD-10-CM | POA: Diagnosis not present

## 2021-09-02 DIAGNOSIS — I951 Orthostatic hypotension: Secondary | ICD-10-CM | POA: Diagnosis not present

## 2021-09-02 DIAGNOSIS — N1831 Chronic kidney disease, stage 3a: Secondary | ICD-10-CM | POA: Diagnosis not present

## 2021-09-02 DIAGNOSIS — E860 Dehydration: Secondary | ICD-10-CM | POA: Diagnosis not present

## 2021-09-03 ENCOUNTER — Other Ambulatory Visit: Payer: Medicare Other | Admitting: Nurse Practitioner

## 2021-09-04 ENCOUNTER — Ambulatory Visit (HOSPITAL_BASED_OUTPATIENT_CLINIC_OR_DEPARTMENT_OTHER): Payer: Medicare Other

## 2021-09-04 ENCOUNTER — Telehealth (HOSPITAL_BASED_OUTPATIENT_CLINIC_OR_DEPARTMENT_OTHER): Payer: Self-pay | Admitting: Family

## 2021-09-04 DIAGNOSIS — R000 Tachycardia, unspecified: Secondary | ICD-10-CM

## 2021-09-04 NOTE — Telephone Encounter (Signed)
Spoke to pt daughter, OK per DPR. Palliative Care had not mentioned monitor to them. Explained all details and instructions for monitor along with reason. Pt daughter agreeable to plan. She stated she will be going out of town in a couple of weeks, so wanted to make sure monitor would get there in time. Informed that this usually only takes a couple of days to arrive. Pt daughter thankful and understanding of instructions.  Monitor order placed and registered for Home enrollment.

## 2021-09-04 NOTE — Telephone Encounter (Signed)
Received notice from [name] at the [facility] heart failure clinic that she was going to by palliative care regarding possible monitor for the patient.  Recent palliative care note notes heart rate at times up to 1 40-1 70s then return to 80-90.  Should it self resolved and was associated with anxiety and palpitations per palliative care documentation.  Some element of tachycardia would not be surprising given deconditioning, COPD, heart failure, MR.  Treatment of tachyarrhythmias will likely limited by hypotension.  We will plan for 7-day ZIO XT.  Will route to triage team to reach out to [facility] and coordinate mailing of 7 day ZIO-XT monitor.   Loel Dubonnet, NP

## 2021-09-05 NOTE — Progress Notes (Deleted)
NEUROLOGY CONSULTATION NOTE  Anita [name] MRN: [id number] DOB: 23-Jul-1961  Referring provider: Alma Friendly, NP Primary care provider: Alma Friendly, NP  Reason for consult:  multiple sclerosis  Assessment/Plan:   ***   Subjective:  Anita [name] is a 60 year old right-handed female with CHF, ischemic cardiomyopathy, severe mitral regurgitation, prolonged QT interval, CAD, COPD who presents for multiple sclerosis.  History supplemented by prior neurologist's notes and referring provider's note.  She was diagnosed with multiple sclerosis ***.  For several years she declined DMT.   ***  Vision:  *** Motor:  *** Sensory:  *** Pain:  *** Gait:  *** Bowel/Bladder:  *** Fatigue:  *** Cognition:  *** Mood:  ***  Current DMT:  None Past DMT:  Tysabri (JC antibody positive)  Current medications:  *** Past medications:  ***   Imaging: 10/27/2003 MRI BRAIN WO (personally reviewed):  Increasing size and number of multiple intracranial lesions involving the periventricular subcortical white matter, consistent with progression of MS since previous study of April, 2002. 03/26/2001 MRI BRAIN W WO:  Small white matter lesions on the right are unchanged (compared to 04/05/99).  These lesions are suggestive but not diagnostic of multiple sclerosis.  Other causes of small vessel ischemic disease could also give this appearance, including diabetes, hypertension, vasculitis or migraine headaches. 04/05/1999 MRI BRAIN W WO:  There are multiple small nonspecific foci of increased T2 prolongation involving the cerebral white matter.  In this young patient with reported clinically suspected optic neuritis multiple sclerosis is a consideration.  Further considerations include causes for small vessel disease including diabetes, hypertension, vasculitis, collagen vascular disease, and migraines.  PAST MEDICAL HISTORY: Past Medical History:  Diagnosis Date   Abnormality of gait  07/26/2013   Acute respiratory failure with hypoxia ([place]) 03/20/2020   Allergy    Arthritis    Atypical pneumonia 03/22/2020   CAD (coronary artery disease)    a. 03/2020 Cath: LM nl, LAD 60/30m, 55d, D1 70, D2 70, LCX 45ost/p, 65p, RCA 100ost CTO. RPDA fills via collats from dLAD. Inf septal fills via collats from 1st septal, RPAV fills via collats from LPAV, RPL1/2 sev dzs-->med rx.   Chickenpox    Chronic combined systolic (congestive) and diastolic (congestive) heart failure ([place])    a. 02/2021 Echo: EF 40-45%, glob HK, gr2 DD. Sev red RV fxn, RVSP 69.90mmHg. Mod dil LA, sev dil RA. Sev MR. Mild to mod MS. Mean MV grad 6.21mmHg. Mod-Sev TR. Mild AI. Mild Ao sclerosis w/o stenosis.   COPD (chronic obstructive pulmonary disease) (HCC)    Elevated troponin 03/22/2020   GERD (gastroesophageal reflux disease)    Headache(784.0)    Migraine   Hearing loss    Right ear secondary to infection   History of shingles    Ischemic cardiomyopathy    a. 02/2021 Echo: EF 40-45%, glob HK.   Migraines    Multiple sclerosis (HCC)    Obese    Optic neuritis    PAH (pulmonary artery hypertension) ([place])    a. 02/2021 Echo: RVSP 69.82mmHg.   Prolonged QT interval 03/20/2020   Severe mitral regurgitation    a. 02/2021 Echo: Severe MR w/ mild to mod MS.  Mean MV grad 6.43mmHg.    PAST SURGICAL HISTORY: Past Surgical History:  Procedure Laterality Date   COLONOSCOPY WITH PROPOFOL N/A 08/03/2020   Procedure: COLONOSCOPY WITH PROPOFOL;  Surgeon: [name], MD;  Location: [facility] ENDOSCOPY;  Service: Gastroenterology;  Laterality: N/A;   Ganglioneuroma  Resection   LEFT HEART CATH AND CORONARY ANGIOGRAPHY N/A 03/23/2020   Procedure: LEFT HEART CATH AND CORONARY ANGIOGRAPHY;  Surgeon: Leonie Man, MD;  Location: [facility];  Service: Cardiovascular;  Laterality: N/A;   PILONIDAL CYST EXCISION     PILONIDAL CYST EXCISION  1983   TUMOR REMOVAL  2007/2008    MEDICATIONS: Current Outpatient  Medications on File Prior to Visit  Medication Sig Dispense Refill   albuterol (VENTOLIN HFA) 108 (90 Base) MCG/ACT inhaler Inhale 1-2 puffs into the lungs every 6 (six) hours as needed for wheezing or shortness of breath. Use sparingly! 18 each 0   calcium carbonate (TUMS EX) 750 MG chewable tablet Chew 1 tablet by mouth daily as needed for heartburn.     cetirizine (ZYRTEC) 10 MG tablet Take 10 mg by mouth daily.     diclofenac Sodium (VOLTAREN) 1 % GEL Apply 2 g topically 4 (four) times daily as needed (back or joint pain). 50 g 1   Doxylamine Succinate, Sleep, (SLEEP AID PO) Take 1 tablet by mouth at bedtime as needed (sleep).      feeding supplement, ENSURE ENLIVE, (ENSURE ENLIVE) LIQD Take 237 mLs by mouth 2 (two) times daily between meals.     ferrous sulfate 325 (65 FE) MG EC tablet TAKE 1 TABLET BY MOUTH 2 TIMES DAILY WITH A MEAL. 180 tablet 1   Fluticasone-Salmeterol (ADVAIR DISKUS) 250-50 MCG/DOSE AEPB Inhale 1 puff into the lungs 2 (two) times daily. 1 each 3   guaiFENesin (MUCINEX) 600 MG 12 hr tablet Take 600 mg by mouth 2 (two) times daily as needed for cough.     midodrine (PROAMATINE) 2.5 MG tablet Take 1 tablet (2.5 mg total) by mouth 3 (three) times daily with meals. 90 tablet 5   Multiple Vitamin (MULTIVITAMIN WITH MINERALS) TABS tablet Take 1 tablet by mouth daily.     nitroGLYCERIN (NITROSTAT) 0.4 MG SL tablet Place 1 tablet (0.4 mg total) under the tongue every 5 (five) minutes as needed for chest pain. 20 tablet 5   omeprazole (PRILOSEC) 40 MG capsule Take 1 capsule (40 mg total) by mouth daily. 90 capsule 3   Probiotic Product (PROBIOTIC ADVANCED) CAPS Take 1 capsule by mouth daily.     SUMAtriptan (IMITREX) 25 MG tablet Take 1 tablet by mouth at migraine onset, may repeat in 2 hours if headache persists or recurs. Do not exceed 2 tablets in 24 hours. 10 tablet 0   torsemide (DEMADEX) 20 MG tablet Take only as needed.  Take for weight gain of 2 lbs in 1 day or 5 lbs in 1  week or obvious swelling. (Patient taking differently: Take only as needed.  Take for weight gain of 2 lbs in 1 day or 5 lbs in 1 week or obvious swelling.) 30 tablet 0   vitamin B-12 (CYANOCOBALAMIN) 1000 MCG tablet Take 1,000 mcg by mouth daily.     No current facility-administered medications on file prior to visit.    ALLERGIES: Allergies  Allergen Reactions   Acthar Hp [Corticotropin] Other (See Comments)    Throat swelling and coughing up blood   Codeine    Prednisone     FAMILY HISTORY: Family History  Problem Relation Age of Onset   Skin cancer Mother    Lung cancer Father    Uterine cancer Sister    Heart disease Brother    Bipolar disorder Sister    Throat cancer Paternal Grandmother     Objective:  ***  General: No acute distress.  Patient appears well-groomed.   Head:  Normocephalic/atraumatic Eyes:  fundi examined but not visualized Neck: supple, no paraspinal tenderness, full range of motion Back: No paraspinal tenderness Heart: regular rate and rhythm Lungs: Clear to auscultation bilaterally. Vascular: No carotid bruits. Neurological Exam: Mental status: alert and oriented to person, place, and time, recent and remote memory intact, fund of knowledge intact, attention and concentration intact, speech fluent and not dysarthric, language intact. Cranial nerves: CN I: not tested CN II: pupils equal, round and reactive to light, visual fields intact CN III, IV, VI:  full range of motion, no nystagmus, no ptosis CN V: facial sensation intact. CN VII: upper and lower face symmetric CN VIII: hearing intact CN IX, X: gag intact, uvula midline CN XI: sternocleidomastoid and trapezius muscles intact CN XII: tongue midline Bulk & Tone: normal, no fasciculations. Motor:  muscle strength 5/5 throughout Sensation:  Pinprick, temperature and vibratory sensation intact. Deep Tendon Reflexes:  2+ throughout,  toes downgoing.   Finger to nose testing:  Without  dysmetria.   Heel to shin:  Without dysmetria.   Gait:  Normal station and stride.  Romberg negative.    Thank you for allowing me to take part in the care of this patient.  Metta Clines, DO  CC: ***

## 2021-09-06 ENCOUNTER — Ambulatory Visit: Payer: Medicare Other | Admitting: Neurology

## 2021-09-06 DIAGNOSIS — I130 Hypertensive heart and chronic kidney disease with heart failure and stage 1 through stage 4 chronic kidney disease, or unspecified chronic kidney disease: Secondary | ICD-10-CM | POA: Diagnosis not present

## 2021-09-06 DIAGNOSIS — I951 Orthostatic hypotension: Secondary | ICD-10-CM | POA: Diagnosis not present

## 2021-09-06 DIAGNOSIS — N179 Acute kidney failure, unspecified: Secondary | ICD-10-CM | POA: Diagnosis not present

## 2021-09-06 DIAGNOSIS — E860 Dehydration: Secondary | ICD-10-CM | POA: Diagnosis not present

## 2021-09-06 DIAGNOSIS — I5043 Acute on chronic combined systolic (congestive) and diastolic (congestive) heart failure: Secondary | ICD-10-CM | POA: Diagnosis not present

## 2021-09-06 DIAGNOSIS — N1831 Chronic kidney disease, stage 3a: Secondary | ICD-10-CM | POA: Diagnosis not present

## 2021-09-10 DIAGNOSIS — N1831 Chronic kidney disease, stage 3a: Secondary | ICD-10-CM | POA: Diagnosis not present

## 2021-09-10 DIAGNOSIS — I130 Hypertensive heart and chronic kidney disease with heart failure and stage 1 through stage 4 chronic kidney disease, or unspecified chronic kidney disease: Secondary | ICD-10-CM | POA: Diagnosis not present

## 2021-09-10 DIAGNOSIS — I951 Orthostatic hypotension: Secondary | ICD-10-CM | POA: Diagnosis not present

## 2021-09-10 DIAGNOSIS — I5043 Acute on chronic combined systolic (congestive) and diastolic (congestive) heart failure: Secondary | ICD-10-CM | POA: Diagnosis not present

## 2021-09-10 DIAGNOSIS — E860 Dehydration: Secondary | ICD-10-CM | POA: Diagnosis not present

## 2021-09-10 DIAGNOSIS — N179 Acute kidney failure, unspecified: Secondary | ICD-10-CM | POA: Diagnosis not present

## 2021-09-12 DIAGNOSIS — E860 Dehydration: Secondary | ICD-10-CM | POA: Diagnosis not present

## 2021-09-12 DIAGNOSIS — I951 Orthostatic hypotension: Secondary | ICD-10-CM | POA: Diagnosis not present

## 2021-09-12 DIAGNOSIS — N1831 Chronic kidney disease, stage 3a: Secondary | ICD-10-CM | POA: Diagnosis not present

## 2021-09-12 DIAGNOSIS — I5043 Acute on chronic combined systolic (congestive) and diastolic (congestive) heart failure: Secondary | ICD-10-CM | POA: Diagnosis not present

## 2021-09-12 DIAGNOSIS — N179 Acute kidney failure, unspecified: Secondary | ICD-10-CM | POA: Diagnosis not present

## 2021-09-12 DIAGNOSIS — I130 Hypertensive heart and chronic kidney disease with heart failure and stage 1 through stage 4 chronic kidney disease, or unspecified chronic kidney disease: Secondary | ICD-10-CM | POA: Diagnosis not present

## 2021-09-13 ENCOUNTER — Other Ambulatory Visit: Payer: Self-pay | Admitting: Family Medicine

## 2021-09-13 DIAGNOSIS — I10 Essential (primary) hypertension: Secondary | ICD-10-CM

## 2021-09-17 ENCOUNTER — Ambulatory Visit (HOSPITAL_BASED_OUTPATIENT_CLINIC_OR_DEPARTMENT_OTHER): Payer: Medicare Other | Admitting: Cardiology

## 2021-09-18 ENCOUNTER — Telehealth: Payer: Self-pay | Admitting: Primary Care

## 2021-09-18 DIAGNOSIS — I5043 Acute on chronic combined systolic (congestive) and diastolic (congestive) heart failure: Secondary | ICD-10-CM | POA: Diagnosis not present

## 2021-09-18 DIAGNOSIS — E860 Dehydration: Secondary | ICD-10-CM | POA: Diagnosis not present

## 2021-09-18 DIAGNOSIS — I951 Orthostatic hypotension: Secondary | ICD-10-CM | POA: Diagnosis not present

## 2021-09-18 DIAGNOSIS — I130 Hypertensive heart and chronic kidney disease with heart failure and stage 1 through stage 4 chronic kidney disease, or unspecified chronic kidney disease: Secondary | ICD-10-CM | POA: Diagnosis not present

## 2021-09-18 DIAGNOSIS — N179 Acute kidney failure, unspecified: Secondary | ICD-10-CM | POA: Diagnosis not present

## 2021-09-18 DIAGNOSIS — N1831 Chronic kidney disease, stage 3a: Secondary | ICD-10-CM | POA: Diagnosis not present

## 2021-09-18 NOTE — Telephone Encounter (Signed)
Anita [name] with [facility] called stating that they are discharging home PT for pt. Pt had a brief tachycardic attack this morning, but its under control now.

## 2021-09-18 NOTE — Telephone Encounter (Signed)
Noted  

## 2021-09-18 NOTE — Telephone Encounter (Signed)
Called and spoke to [place]. Patient became symptomatic and asked that her heart rate be checked during visit. Per Cecille Rubin it was elevated but within a few minutes it was back in the 80 with no continued symptoms. Patient does have a monitor for Cardiology that she is going to have daughter put on to night to have ran. No action is needed from [place] at this time.

## 2021-09-18 NOTE — Telephone Encounter (Signed)
Called patient l/m to call office to get more information

## 2021-09-22 NOTE — Progress Notes (Deleted)
NEUROLOGY CONSULTATION NOTE  SHARILYN GEISINGER MRN: [id number] DOB: [birth year]  Referring provider: Alma Friendly, NP Primary care provider: Alma Friendly, NP  Reason for consult:  multiple sclerosis  Assessment/Plan:   ***   Subjective:  Anita [name] is a 60 year old right-handed female with CAD, prolonged QT, severe mitral regurg, CHF and arthritis who presents for multiple sclerosis.  History supplemented by prior neurologist's and referring provider's notes.  Diagnosed with multiple sclerosis ***.  For several years (***) she declined being on DMT.  She reportedly had significant decline in 2014 with worsening gait in which she resorted to using a walker, mild cognitive impairment and occasional fecal incontinence.    Vision:  *** Motor:  *** Sensory:  *** Pain:  *** Gait:  *** Bowel/Bladder:  *** Fatigue:  *** Cognition:  Mild cognitive impairment.  *** Mood:  ***  She has significant medical co-morbidities including COPD, rheumatic mitral regurgitation, CAD, orthostatic hypotension, combined systolic and diastolic heart failure, and iron-deficiency anemia  Current DMT:  *** Past DMT:  Tysabri (JC antibody positive)  Current medications:  midodrine, torsemide, sumatriptan 25mg , B12, ferrous sulfate, MVI Past medications:  H.P. Acthar Gel (throat swelling, hemoptysis), Solu-Medrol   Imaging: 10/27/2003 MRI BRAIN W WO (personally reviewed):  increasing size and number of multiple intracranial lesions involving the periventricular subcortical white matter, consistent with progression of MS since previous study of April, 2002. 03/26/2001 MRI BRAIN W WO:  Small white matter lesions on the right are unchanged (compared to MRI from 04/05/99).  These lesions are suggestive but not diagnostic of multiple sclerosis.  Other causes of small vessel ischemic disease could also give this appearance, including diabetes, hypertension, vasculitis, or migraine  headaches. 04/05/1999 MRI BRAIN W WO:  There are multiple small nonspecific foci of increased T2 prolongation involving the cerebral white matter.  In this young patient with reported clinically suspected optic neuritis multiple sclerosis is a consideration.  Further considerations include causes for small vessel disease including diabetes, hypertension, vasculitis, collagen vascular disease, and migraines.   PAST MEDICAL HISTORY: Past Medical History:  Diagnosis Date   Abnormality of gait 07/26/2013   Acute respiratory failure with hypoxia ([place]) 03/20/2020   Allergy    Arthritis    Atypical pneumonia 03/22/2020   CAD (coronary artery disease)    a. 03/2020 Cath: LM nl, LAD 60/57m, 55d, D1 70, D2 70, LCX 45ost/p, 65p, RCA 100ost CTO. RPDA fills via collats from dLAD. Inf septal fills via collats from 1st septal, RPAV fills via collats from LPAV, RPL1/2 sev dzs-->med rx.   Chickenpox    Chronic combined systolic (congestive) and diastolic (congestive) heart failure ([place])    a. 02/2021 Echo: EF 40-45%, glob HK, gr2 DD. Sev red RV fxn, RVSP 69.24mmHg. Mod dil LA, sev dil RA. Sev MR. Mild to mod MS. Mean MV grad 6.54mmHg. Mod-Sev TR. Mild AI. Mild Ao sclerosis w/o stenosis.   COPD (chronic obstructive pulmonary disease) (HCC)    Elevated troponin 03/22/2020   GERD (gastroesophageal reflux disease)    Headache(784.0)    Migraine   Hearing loss    Right ear secondary to infection   History of shingles    Ischemic cardiomyopathy    a. 02/2021 Echo: EF 40-45%, glob HK.   Migraines    Multiple sclerosis (HCC)    Obese    Optic neuritis    PAH (pulmonary artery hypertension) ([place])    a. 02/2021 Echo: RVSP 69.71mmHg.   Prolonged QT interval 03/20/2020  Severe mitral regurgitation    a. 02/2021 Echo: Severe MR w/ mild to mod MS.  Mean MV grad 6.79mmHg.    PAST SURGICAL HISTORY: Past Surgical History:  Procedure Laterality Date   COLONOSCOPY WITH PROPOFOL N/A 08/03/2020   Procedure: COLONOSCOPY  WITH PROPOFOL;  Surgeon: [name], MD;  Location: [facility] ENDOSCOPY;  Service: Gastroenterology;  Laterality: N/A;   Ganglioneuroma     Resection   LEFT HEART CATH AND CORONARY ANGIOGRAPHY N/A 03/23/2020   Procedure: LEFT HEART CATH AND CORONARY ANGIOGRAPHY;  Surgeon: Leonie Man, MD;  Location: [place] [facility];  Service: Cardiovascular;  Laterality: N/A;   PILONIDAL CYST EXCISION     PILONIDAL CYST EXCISION  1983   TUMOR REMOVAL  2007/2008    MEDICATIONS: Current Outpatient Medications on File Prior to Visit  Medication Sig Dispense Refill   albuterol (VENTOLIN HFA) 108 (90 Base) MCG/ACT inhaler Inhale 1-2 puffs into the lungs every 6 (six) hours as needed for wheezing or shortness of breath. Use sparingly! 18 each 0   calcium carbonate (TUMS EX) 750 MG chewable tablet Chew 1 tablet by mouth daily as needed for heartburn.     cetirizine (ZYRTEC) 10 MG tablet Take 10 mg by mouth daily.     diclofenac Sodium (VOLTAREN) 1 % GEL Apply 2 g topically 4 (four) times daily as needed (back or joint pain). 50 g 1   Doxylamine Succinate, Sleep, (SLEEP AID PO) Take 1 tablet by mouth at bedtime as needed (sleep).      feeding supplement, ENSURE ENLIVE, (ENSURE ENLIVE) LIQD Take 237 mLs by mouth 2 (two) times daily between meals.     ferrous sulfate 325 (65 FE) MG EC tablet TAKE 1 TABLET BY MOUTH 2 TIMES DAILY WITH A MEAL. 180 tablet 1   Fluticasone-Salmeterol (ADVAIR DISKUS) 250-50 MCG/DOSE AEPB Inhale 1 puff into the lungs 2 (two) times daily. 1 each 3   guaiFENesin (MUCINEX) 600 MG 12 hr tablet Take 600 mg by mouth 2 (two) times daily as needed for cough.     midodrine (PROAMATINE) 2.5 MG tablet Take 1 tablet (2.5 mg total) by mouth 3 (three) times daily with meals. 90 tablet 5   Multiple Vitamin (MULTIVITAMIN WITH MINERALS) TABS tablet Take 1 tablet by mouth daily.     nitroGLYCERIN (NITROSTAT) 0.4 MG SL tablet Place 1 tablet (0.4 mg total) under the tongue every 5 (five) minutes as needed for  chest pain. 20 tablet 5   omeprazole (PRILOSEC) 40 MG capsule Take 1 capsule (40 mg total) by mouth daily. 90 capsule 3   Probiotic Product (PROBIOTIC ADVANCED) CAPS Take 1 capsule by mouth daily.     SUMAtriptan (IMITREX) 25 MG tablet Take 1 tablet by mouth at migraine onset, may repeat in 2 hours if headache persists or recurs. Do not exceed 2 tablets in 24 hours. 10 tablet 0   torsemide (DEMADEX) 20 MG tablet Take only as needed.  Take for weight gain of 2 lbs in 1 day or 5 lbs in 1 week or obvious swelling. (Patient taking differently: Take only as needed.  Take for weight gain of 2 lbs in 1 day or 5 lbs in 1 week or obvious swelling.) 30 tablet 0   vitamin B-12 (CYANOCOBALAMIN) 1000 MCG tablet Take 1,000 mcg by mouth daily.     No current facility-administered medications on file prior to visit.    ALLERGIES: Allergies  Allergen Reactions   Acthar Hp [Corticotropin] Other (See Comments)  Throat swelling and coughing up blood   Codeine    Prednisone     FAMILY HISTORY: Family History  Problem Relation Age of Onset   Skin cancer Mother    Lung cancer Father    Uterine cancer Sister    Heart disease Brother    Bipolar disorder Sister    Throat cancer Paternal Grandmother     Objective:  *** General: No acute distress.  Patient appears well-groomed.   Head:  Normocephalic/atraumatic Eyes:  fundi examined but not visualized Neck: supple, no paraspinal tenderness, full range of motion Back: No paraspinal tenderness Heart: regular rate and rhythm Lungs: Clear to auscultation bilaterally. Vascular: No carotid bruits. Neurological Exam: Mental status: alert and oriented to person, place, and time, recent and remote memory intact, fund of knowledge intact, attention and concentration intact, speech fluent and not dysarthric, language intact. Cranial nerves: CN I: not tested CN II: pupils equal, round and reactive to light, visual fields intact CN III, IV, VI:  full range of  motion, no nystagmus, no ptosis CN V: facial sensation intact. CN VII: upper and lower face symmetric CN VIII: hearing intact CN IX, X: gag intact, uvula midline CN XI: sternocleidomastoid and trapezius muscles intact CN XII: tongue midline Bulk & Tone: normal, no fasciculations. Motor:  muscle strength 5/5 throughout Sensation:  Pinprick, temperature and vibratory sensation intact. Deep Tendon Reflexes:  2+ throughout,  toes downgoing.   Finger to nose testing:  Without dysmetria.   Heel to shin:  Without dysmetria.   Gait:  Normal station and stride.  Romberg negative.    Thank you for allowing me to take part in the care of this patient.  Metta Clines, DO  CC: Alma Friendly, NP

## 2021-09-23 ENCOUNTER — Ambulatory Visit: Payer: Medicare Other | Admitting: Neurology

## 2021-09-25 DIAGNOSIS — N1831 Chronic kidney disease, stage 3a: Secondary | ICD-10-CM | POA: Diagnosis not present

## 2021-09-25 DIAGNOSIS — I951 Orthostatic hypotension: Secondary | ICD-10-CM | POA: Diagnosis not present

## 2021-09-25 DIAGNOSIS — I130 Hypertensive heart and chronic kidney disease with heart failure and stage 1 through stage 4 chronic kidney disease, or unspecified chronic kidney disease: Secondary | ICD-10-CM | POA: Diagnosis not present

## 2021-09-25 DIAGNOSIS — N179 Acute kidney failure, unspecified: Secondary | ICD-10-CM | POA: Diagnosis not present

## 2021-09-25 DIAGNOSIS — I5043 Acute on chronic combined systolic (congestive) and diastolic (congestive) heart failure: Secondary | ICD-10-CM | POA: Diagnosis not present

## 2021-09-25 DIAGNOSIS — E860 Dehydration: Secondary | ICD-10-CM | POA: Diagnosis not present

## 2021-09-28 DIAGNOSIS — I130 Hypertensive heart and chronic kidney disease with heart failure and stage 1 through stage 4 chronic kidney disease, or unspecified chronic kidney disease: Secondary | ICD-10-CM | POA: Diagnosis not present

## 2021-09-28 DIAGNOSIS — I5043 Acute on chronic combined systolic (congestive) and diastolic (congestive) heart failure: Secondary | ICD-10-CM | POA: Diagnosis not present

## 2021-09-28 DIAGNOSIS — I272 Pulmonary hypertension, unspecified: Secondary | ICD-10-CM | POA: Diagnosis not present

## 2021-09-28 DIAGNOSIS — I340 Nonrheumatic mitral (valve) insufficiency: Secondary | ICD-10-CM | POA: Diagnosis not present

## 2021-09-28 DIAGNOSIS — I2510 Atherosclerotic heart disease of native coronary artery without angina pectoris: Secondary | ICD-10-CM | POA: Diagnosis not present

## 2021-09-28 DIAGNOSIS — Z87891 Personal history of nicotine dependence: Secondary | ICD-10-CM | POA: Diagnosis not present

## 2021-09-28 DIAGNOSIS — Z7982 Long term (current) use of aspirin: Secondary | ICD-10-CM | POA: Diagnosis not present

## 2021-09-28 DIAGNOSIS — J449 Chronic obstructive pulmonary disease, unspecified: Secondary | ICD-10-CM | POA: Diagnosis not present

## 2021-09-28 DIAGNOSIS — I951 Orthostatic hypotension: Secondary | ICD-10-CM | POA: Diagnosis not present

## 2021-09-28 DIAGNOSIS — N1831 Chronic kidney disease, stage 3a: Secondary | ICD-10-CM | POA: Diagnosis not present

## 2021-09-28 DIAGNOSIS — G35 Multiple sclerosis: Secondary | ICD-10-CM | POA: Diagnosis not present

## 2021-09-28 DIAGNOSIS — I255 Ischemic cardiomyopathy: Secondary | ICD-10-CM | POA: Diagnosis not present

## 2021-10-01 ENCOUNTER — Ambulatory Visit (INDEPENDENT_AMBULATORY_CARE_PROVIDER_SITE_OTHER): Payer: Medicare Other | Admitting: Cardiology

## 2021-10-01 ENCOUNTER — Ambulatory Visit (INDEPENDENT_AMBULATORY_CARE_PROVIDER_SITE_OTHER): Payer: Medicare Other

## 2021-10-01 ENCOUNTER — Other Ambulatory Visit: Payer: Self-pay

## 2021-10-01 ENCOUNTER — Encounter (HOSPITAL_BASED_OUTPATIENT_CLINIC_OR_DEPARTMENT_OTHER): Payer: Self-pay | Admitting: Cardiology

## 2021-10-01 ENCOUNTER — Ambulatory Visit (HOSPITAL_BASED_OUTPATIENT_CLINIC_OR_DEPARTMENT_OTHER): Payer: Medicare Other | Admitting: Cardiology

## 2021-10-01 VITALS — BP 152/100 | HR 94 | Wt 112.6 lb

## 2021-10-01 DIAGNOSIS — R000 Tachycardia, unspecified: Secondary | ICD-10-CM

## 2021-10-01 DIAGNOSIS — I255 Ischemic cardiomyopathy: Secondary | ICD-10-CM

## 2021-10-01 DIAGNOSIS — I2510 Atherosclerotic heart disease of native coronary artery without angina pectoris: Secondary | ICD-10-CM

## 2021-10-01 DIAGNOSIS — I340 Nonrheumatic mitral (valve) insufficiency: Secondary | ICD-10-CM | POA: Diagnosis not present

## 2021-10-01 DIAGNOSIS — I5042 Chronic combined systolic (congestive) and diastolic (congestive) heart failure: Secondary | ICD-10-CM

## 2021-10-01 DIAGNOSIS — Z7189 Other specified counseling: Secondary | ICD-10-CM | POA: Diagnosis not present

## 2021-10-01 DIAGNOSIS — R0602 Shortness of breath: Secondary | ICD-10-CM | POA: Diagnosis not present

## 2021-10-01 NOTE — Patient Instructions (Signed)
Medication Instructions:  Your Physician recommend you continue on your current medication as directed.    *If you need a refill on your cardiac medications before your next appointment, please call your pharmacy*   Lab Work: None ordered today  If you have labs (blood work) drawn today and your tests are completely normal, you will receive your results only by: Ethel (if you have MyChart) OR A paper copy in the mail If you have any lab test that is abnormal or we need to change your treatment, we will call you to review the results.   Testing/Procedures: None ordered today    Follow-Up: At [facility], you and your health needs are our priority.  As part of our continuing mission to provide you with exceptional heart care, we have created designated Provider Care Teams.  These Care Teams include your primary Cardiologist (physician) and Advanced Practice Providers (APPs -  Physician Assistants and Nurse Practitioners) who all work together to provide you with the care you need, when you need it.  We recommend signing up for the patient portal called "MyChart".  Sign up information is provided on this After Visit Summary.  MyChart is used to connect with patients for Virtual Visits (Telemedicine).  Patients are able to view lab/test results, encounter notes, upcoming appointments, etc.  Non-urgent messages can be sent to your provider as well.   To learn more about what you can do with MyChart, go to [email or web address].    Your next appointment:   3 month(s)  The format for your next appointment:   In Person  Provider:   Buford Dresser, MD   Other Instructions None today

## 2021-10-01 NOTE — Progress Notes (Signed)
Cardiology Office Note:    Date:  10/02/2021   ID:  Anita [name], DOB [birth year], MRN [id number]  PCP:  [name], NP  Cardiologist:  Buford Dresser, MD  Referring MD: [name], NP   CC: follow up  History of Present Illness:    Anita [name] is a 60 y.o. female with history of multiple sclerosis, CAD, severe mitral regurgitation with moderate mitral stenosis, likely COPD, prior tobacco use. I met her in the hospital 03/2020, see workup below.  Today: She is accompanied by a family member. They have brought her 7-day monitor with them today for assistance with placement.   Randomly she experiences minor chest pains, usually in the same location in her central chest. Her pain may occur at night, and generally has a duration of a few minutes.  Every now and then she continues to feel racing heart beats, including while at rest. At times she feels a throbbing in her ear and neck.  Also has shortness of breath. She notes needing to take her torsemide about 3 times a month.  Of note, she has difficulty with ambulating her left LE at times.  She denies any lightheadedness, headaches, syncope, orthopnea, PND, lower extremity edema or exertional symptoms.  She has been able to come off of the midodrine, removed from her med list today. She has also met with palliative care.   Past Medical History:  Diagnosis Date   Abnormality of gait 07/26/2013   Acute respiratory failure with hypoxia ([place]) 03/20/2020   Allergy    Arthritis    Atypical pneumonia 03/22/2020   CAD (coronary artery disease)    a. 03/2020 Cath: LM nl, LAD 60/73m 55d, D1 70, D2 70, LCX 45ost/p, 65p, RCA 100ost CTO. RPDA fills via collats from dLAD. Inf septal fills via collats from 1st septal, RPAV fills via collats from LPAV, RPL1/2 sev dzs-->med rx.   Chickenpox    Chronic combined systolic (congestive) and diastolic (congestive) heart failure ([place]    a. 02/2021 Echo: EF  40-45%, glob HK, gr2 DD. Sev red RV fxn, RVSP 69.238mg. Mod dil LA, sev dil RA. Sev MR. Mild to mod MS. Mean MV grad 6.104m22m. Mod-Sev TR. Mild AI. Mild Ao sclerosis w/o stenosis.   COPD (chronic obstructive pulmonary disease) (HCC)    Elevated troponin 03/22/2020   GERD (gastroesophageal reflux disease)    Headache(784.0)    Migraine   Hearing loss    Right ear secondary to infection   History of shingles    Ischemic cardiomyopathy    a. 02/2021 Echo: EF 40-45%, glob HK.   Migraines    Multiple sclerosis (HCC)    Obese    Optic neuritis    PAH [place]pulmonary artery hypertension) ([place]  a. 02/2021 Echo: RVSP 69.2mm49m   Prolonged QT interval 03/20/2020   Severe mitral regurgitation    a. 02/2021 Echo: Severe MR w/ mild to mod MS.  Mean MV grad 6.104mmH36m   Past Surgical History:  Procedure Laterality Date   COLONOSCOPY WITH PROPOFOL N/A 08/03/2020   Procedure: COLONOSCOPY WITH PROPOFOL;  Surgeon: Anna,[name]  [facility];  Service: Gastroenterology;  Laterality: N/A;   Ganglioneuroma     Resection   LEFT HEART CATH AND CORONARY ANGIOGRAPHY N/A 03/23/2020   Procedure: LEFT HEART CATH AND CORONARY ANGIOGRAPHY;  Surgeon: HardiLeonie Man  [facility];  Service: Cardiovascular;  Laterality: N/A;   PILONIDAL CYST EXCISION  PILONIDAL CYST EXCISION  1983   TUMOR REMOVAL  2007/2008    Current Medications: Current Outpatient Medications on File Prior to Visit  Medication Sig   albuterol (VENTOLIN HFA) 108 (90 Base) MCG/ACT inhaler Inhale 1-2 puffs into the lungs every 6 (six) hours as needed for wheezing or shortness of breath. Use sparingly!   calcium carbonate (TUMS EX) 750 MG chewable tablet Chew 1 tablet by mouth daily as needed for heartburn.   cetirizine (ZYRTEC) 10 MG tablet Take 10 mg by mouth daily.   diclofenac Sodium (VOLTAREN) 1 % GEL Apply 2 g topically 4 (four) times daily as needed (back or joint pain).   Doxylamine Succinate, Sleep,  (SLEEP AID PO) Take 1 tablet by mouth at bedtime as needed (sleep).    feeding supplement, ENSURE ENLIVE, (ENSURE ENLIVE) LIQD Take 237 mLs by mouth 2 (two) times daily between meals.   ferrous sulfate 325 (65 FE) MG EC tablet TAKE 1 TABLET BY MOUTH 2 TIMES DAILY WITH A MEAL.   Fluticasone-Salmeterol (ADVAIR DISKUS) 250-50 MCG/DOSE AEPB Inhale 1 puff into the lungs 2 (two) times daily.   guaiFENesin (MUCINEX) 600 MG 12 hr tablet Take 600 mg by mouth 2 (two) times daily as needed for cough.   Multiple Vitamin (MULTIVITAMIN WITH MINERALS) TABS tablet Take 1 tablet by mouth daily.   nitroGLYCERIN (NITROSTAT) 0.4 MG SL tablet Place 1 tablet (0.4 mg total) under the tongue every 5 (five) minutes as needed for chest pain.   omeprazole (PRILOSEC) 40 MG capsule Take 1 capsule (40 mg total) by mouth daily.   Probiotic Product (PROBIOTIC ADVANCED) CAPS Take 1 capsule by mouth daily.   SUMAtriptan (IMITREX) 25 MG tablet Take 1 tablet by mouth at migraine onset, may repeat in 2 hours if headache persists or recurs. Do not exceed 2 tablets in 24 hours.   torsemide (DEMADEX) 20 MG tablet Take only as needed.  Take for weight gain of 2 lbs in 1 day or 5 lbs in 1 week or obvious swelling. (Patient taking differently: Take only as needed.  Take for weight gain of 2 lbs in 1 day or 5 lbs in 1 week or obvious swelling.)   vitamin B-12 (CYANOCOBALAMIN) 1000 MCG tablet Take 1,000 mcg by mouth daily.   No current facility-administered medications on file prior to visit.     Allergies:   Acthar hp [corticotropin], Codeine, and Prednisone   Social History   Tobacco Use   Smoking status: Former    Packs/day: 1.00    Types: Cigarettes   Smokeless tobacco: Never  Vaping Use   Vaping Use: Never used  Substance Use Topics   Alcohol use: Yes    Comment: Occasional    Family History: family history includes Bipolar disorder in her sister; Heart disease in her brother; Lung cancer in her father; Skin cancer in her  mother; Throat cancer in her paternal grandmother; Uterine cancer in her sister.  ROS:   Please see the history of present illness.  (+) Minor chest pain (+) Palpitations (+) Shortness of breath Additional pertinent ROS otherwise unremarkable.    EKGs/Labs/Other Studies Reviewed:    The following studies were reviewed today:  Left UE Venous Doppler 05/18/2021: COMPARISON:  None.   FINDINGS: Contralateral Subclavian Vein: Respiratory phasicity is normal and symmetric with the symptomatic side. No evidence of thrombus. Normal compressibility.   Internal Jugular Vein: No evidence of thrombus. Normal compressibility, respiratory phasicity and response to augmentation.   Subclavian Vein: No evidence of  thrombus. Normal compressibility, respiratory phasicity and response to augmentation.   Axillary Vein: No evidence of thrombus. Normal compressibility, respiratory phasicity and response to augmentation.   Cephalic Vein: No evidence of thrombus. Normal compressibility, respiratory phasicity and response to augmentation.   Basilic Vein: No evidence of thrombus. Normal compressibility, respiratory phasicity and response to augmentation.   Brachial Veins: No evidence of thrombus. Normal compressibility, respiratory phasicity and response to augmentation.   Radial Veins: No evidence of thrombus. Normal compressibility, respiratory phasicity and response to augmentation.   Ulnar Veins: No evidence of thrombus. Normal compressibility, respiratory phasicity and response to augmentation.   Venous Reflux:  None visualized.   Other Findings:  None visualized.   IMPRESSION: No evidence of DVT within the left upper extremity.   Diffuse subcutaneous edema.  Echo 3.3.22 1. Left ventricular ejection fraction, by estimation, is 40 to 45%. Left  ventricular ejection fraction by 3D volume is 47 %. The left ventricle has  mildly decreased function. The left ventricle demonstrates  global  hypokinesis. Left ventricular diastolic   parameters are consistent with Grade II diastolic dysfunction  (pseudonormalization). Elevated left ventricular end-diastolic pressure.   2. Right ventricular systolic function is severely reduced. The right  ventricular size is mildly enlarged. There is severely elevated pulmonary  artery systolic pressure. The estimated right ventricular systolic  pressure is 61.4 mmHg.   3. Left atrial size was moderately dilated.   4. Right atrial size was severely dilated.   5. The mitral valve is degenerative with mild to moderate thickening of  the mitral valve leaflets. There is teathering of the MV leaflets due to  mild LV dysfunction. Severe mitral valve regurgitation. Mild to moderate  mitral stenosis. The mean mitral  valve gradient is 6.5 mmHg.   6. Tricuspid valve regurgitation is moderate to severe.   7. The aortic valve is tricuspid. Aortic valve regurgitation is mild.  Mild aortic valve sclerosis is present, with no evidence of aortic valve  stenosis. Aortic regurgitation PHT measures 499 msec.   8. The inferior vena cava is dilated in size with <50% respiratory  variability, suggesting right atrial pressure of 15 mmHg.   9. There is a trivial pericardial effusion posterior to the left  ventricle.  10. Compared to prior echo, Mitral regurgitation appears worse and RV  dysfunction is now present. There is severe Pulmonary HTN.  Echo 03/20/20  1. Left ventricular ejection fraction, by estimation, is 40 to 45%. The  left ventricle has mildly decreased function. The left ventricle  demonstrates global hypokinesis. The left ventricular internal cavity size  was mildly dilated. Left ventricular  diastolic function could not be evaluated.   2. Right ventricular systolic function is normal. The right ventricular  size is normal. There is moderately elevated pulmonary artery systolic  pressure.   3. Left atrial size was mildly dilated.   4.  Moderate mitral subvalvular thickening/fibrosis.   5. The mitral valve is rheumatic. Moderate to severe mitral valve  regurgitation. Mild mitral stenosis. The mean mitral valve gradient is 7.3  mmHg with average heart rate of 105 bpm.   6. The aortic valve is normal in structure. Aortic valve regurgitation is  not visualized.   7. The inferior vena cava is dilated in size with <50% respiratory  variability, suggesting right atrial pressure of 15 mmHg.  CTA Chest 03/20/2020: COMPARISON:  03/19/2006 chest CT   FINDINGS: Cardiovascular: Normal heart size. No pericardial effusion. Aortic atherosclerosis. No pulmonary artery filling defect.  Mediastinum/Nodes: Mild prominence of hilar lymph nodes, likely reactive/congestive.   Lungs/Pleura: Patchy ground-glass opacity in the upper more than lower lungs. Small right and trace left pleural effusion with interlobular septal thickening at the bases.   Upper Abdomen: Left-sided retroperitoneal mass measuring 5.6 cm. Along the inferior aspect of the mass or surgical clips and the mass is smaller than on 2007 abdominal CT.   Musculoskeletal: No acute finding   Review of the MIP images confirms the above findings.   IMPRESSION: 1. Ground-glass opacities with history favoring atypical/viral pneumonia. 2. Small pleural effusions and interlobular septal thickening not typically associated with viral pneumonia, findings could reflect atypical edema or failure superimposed on infection. 3. 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 There is residual or recurrent mass adjacent to the retroperitoneal clips, 5.6 cm. Was the patient is prior resection subtotal?   Cath 03/23/20 Hemodynamics: LV end diastolic pressure is moderately elevated. ----Coronary Angiography----- Ost RCA to Dist RCA lesion is 100% stenosed.-The PDA and PL system fills via faint collaterals from the AV groove LCx and LAD septals. Mid LAD-1 lesion is 60%  stenosed. Mid LAD-2 lesion is 50% stenosed. Dist LAD lesion is 55% stenosed with 70% stenosed side branch in 2nd Diag. 1st Diag lesion is 70% stenosed. Ost Cx to Prox Cx lesion is 45% stenosed. Prox-MID Cx lesion is 65% stenosed.   MODERATE-SEVERE THREE-VESSEL CAD: 100% proximal RCA occlusion with left-to-right collaterals faintly filling PDA and PL system (AV groove LCx-PL and LAD septal-PDA) Diffuse moderate mid LAD disease with 60% to 50% stenosis. Tandem 50% and 65% proximal and mid LCx with the 65% lesion being the most significant lesion. Moderately elevated LVEDP of 18-20 mmHg   Given the extent of disease in the LAD and LCx, neither lesion is very amenable to PCI, and RCA is chronically occluded.  Given her comorbidities, I do not think she would be a good CABG candidate especially in light of the fact that she would likely require valve surgery as well.  She would not recover well.   Recommendations aggressive medical management.  EKG:  EKG is personally reviewed.   05/18/21: sinus tachycardia with pulmonary disease pattern  Recent Labs: 06/04/2021: B Natriuretic Peptide 930.0 06/11/2021: Magnesium 1.7 06/14/2021: BUN 17; Creat 1.13; Potassium 4.6; Sodium 139 06/28/2021: ALT 14 07/26/2021: Hemoglobin 9.5; Platelets 195  Recent Lipid Panel    Component Value Date/Time   CHOL 104 01/08/2021 1016   TRIG 66.0 01/08/2021 1016   HDL 40.20 01/08/2021 1016   CHOLHDL 3 01/08/2021 1016   VLDL 13.2 01/08/2021 1016   LDLCALC 51 01/08/2021 1016    Physical Exam:    VS:  BP (!) 152/100   Pulse 94   Wt 112 lb 9.6 oz (51.1 kg)   SpO2 98%   BMI 19.95 kg/m     Wt Readings from Last 3 Encounters:  10/01/21 112 lb 9.6 oz (51.1 kg)  08/02/21 123 lb 4 oz (55.9 kg)  06/28/21 116 lb (52.6 kg)    GEN: frail appearing, wheelchair bound HEENT: Normal, moist mucous membranes NECK: No JVD appreciated sitting upright CARDIAC: regular rhythm, normal S1 and S2, no rubs or gallops. 4/6 HS  murmur. VASCULAR: Radial and DP pulses 2+ bilaterally. No carotid bruits RESPIRATORY:  distant (Left lung decreased more than right), prolonged expiratory phase ABDOMEN: Soft, non-tender, non-distended MUSCULOSKELETAL:  in wheelchair, moves all limbs independently SKIN: Warm and dry, no significant edema NEUROLOGIC:  Alert and oriented x 3. PSYCHIATRIC:  Normal affect  ASSESSMENT:    1. Chronic combined systolic and diastolic heart failure ([place])   2. Mitral valve insufficiency, unspecified etiology   3. Coronary artery disease involving native coronary artery of native heart without angina pectoris   4. Ischemic cardiomyopathy   5. SOB (shortness of breath)   6. Tachyarrhythmia   7. Goals of care, counseling/discussion     PLAN:   Cardiomyopathy, ischemic Chronic systolic and diastolic heart failure CAD with CTO RCA -no longer on aspirin, statin, beta blocker or entresto. Had anemia, elevated LFTs, and hypotension. Required midodrine until recently. While BP is elevated in office, this is not typical for her.  -continue torsemide as needed  Tachycardia, intermittent -assisted placing Zio monitor today  Shortness of breath, chronic Severe mitral regurgitation with rheumatic appearing mitral valve, with moderate mitral stenosis -thickened leaflets with rheumatic movement -is not interested in surgery and would not likely be a candidate. With mitral stenosis, not a candidate for mitral clip  Overall she has multiple medical conditions, including multiple sclerosis, severe MR/moderate MS, CAD, cardiomyopathy, and likely COPD that are all contributing to her symptoms. We discussed goals of care today. She is working with palliative care to update her wishes. For now, both she and her daughter would like to continue with regular visits and treatments of her conditions as much as we are able. If her wishes change she will let me know.  Plan for follow up: 3 mos or sooner as  needed  Buford Dresser, MD, PhD, [facility]   Medication Adjustments/Labs and Tests Ordered: Current medicines are reviewed at length with the patient today.  Concerns regarding medicines are outlined above.   No orders of the defined types were placed in this encounter.  No orders of the defined types were placed in this encounter.  Patient Instructions  Medication Instructions:  Your Physician recommend you continue on your current medication as directed.    *If you need a refill on your cardiac medications before your next appointment, please call your pharmacy*   Lab Work: None ordered today  If you have labs (blood work) drawn today and your tests are completely normal, you will receive your results only by: [place] (if you have MyChart) OR A paper copy in the mail If you have any lab test that is abnormal or we need to change your treatment, we will call you to review the results.   Testing/Procedures: None ordered today    Follow-Up: At [facility], you and your health needs are our priority.  As part of our continuing mission to provide you with exceptional heart care, we have created designated Provider Care Teams.  These Care Teams include your primary Cardiologist (physician) and Advanced Practice Providers (APPs -  Physician Assistants and Nurse Practitioners) who all work together to provide you with the care you need, when you need it.  We recommend signing up for the patient portal called "MyChart".  Sign up information is provided on this After Visit Summary.  MyChart is used to connect with patients for Virtual Visits (Telemedicine).  Patients are able to view lab/test results, encounter notes, upcoming appointments, etc.  Non-urgent messages can be sent to your provider as well.   To learn more about what you can do with MyChart, go to [email or web address].    Your next appointment:   3 month(s)  The format for your  next appointment:   In Person  Provider:   Buford Dresser, MD  Other Instructions None today   I,Mathew Stumpf,acting as a scribe for [employer], MD.,have documented all relevant documentation on the behalf of Buford Dresser, MD,as directed by  Buford Dresser, MD while in the presence of Buford Dresser, MD.  I, Buford Dresser, MD, have reviewed all documentation for this visit. The documentation on 10/02/21 for the exam, diagnosis, procedures, and orders are all accurate and complete.   Signed, Buford Dresser, MD PhD 10/02/2021    [facility]

## 2021-10-02 ENCOUNTER — Encounter (HOSPITAL_BASED_OUTPATIENT_CLINIC_OR_DEPARTMENT_OTHER): Payer: Self-pay | Admitting: Cardiology

## 2021-10-02 DIAGNOSIS — I130 Hypertensive heart and chronic kidney disease with heart failure and stage 1 through stage 4 chronic kidney disease, or unspecified chronic kidney disease: Secondary | ICD-10-CM | POA: Diagnosis not present

## 2021-10-02 DIAGNOSIS — I951 Orthostatic hypotension: Secondary | ICD-10-CM | POA: Diagnosis not present

## 2021-10-02 DIAGNOSIS — I340 Nonrheumatic mitral (valve) insufficiency: Secondary | ICD-10-CM | POA: Diagnosis not present

## 2021-10-02 DIAGNOSIS — N1831 Chronic kidney disease, stage 3a: Secondary | ICD-10-CM | POA: Diagnosis not present

## 2021-10-02 DIAGNOSIS — I5043 Acute on chronic combined systolic (congestive) and diastolic (congestive) heart failure: Secondary | ICD-10-CM | POA: Diagnosis not present

## 2021-10-02 DIAGNOSIS — I272 Pulmonary hypertension, unspecified: Secondary | ICD-10-CM | POA: Diagnosis not present

## 2021-10-07 ENCOUNTER — Inpatient Hospital Stay: Payer: Medicare Other | Attending: Oncology

## 2021-10-07 ENCOUNTER — Ambulatory Visit: Payer: Medicare Other

## 2021-10-07 ENCOUNTER — Other Ambulatory Visit: Payer: Self-pay

## 2021-10-07 DIAGNOSIS — N1831 Chronic kidney disease, stage 3a: Secondary | ICD-10-CM | POA: Insufficient documentation

## 2021-10-07 DIAGNOSIS — I5043 Acute on chronic combined systolic (congestive) and diastolic (congestive) heart failure: Secondary | ICD-10-CM | POA: Diagnosis not present

## 2021-10-07 DIAGNOSIS — D631 Anemia in chronic kidney disease: Secondary | ICD-10-CM | POA: Insufficient documentation

## 2021-10-07 DIAGNOSIS — E538 Deficiency of other specified B group vitamins: Secondary | ICD-10-CM | POA: Insufficient documentation

## 2021-10-07 DIAGNOSIS — I951 Orthostatic hypotension: Secondary | ICD-10-CM | POA: Diagnosis not present

## 2021-10-07 DIAGNOSIS — I272 Pulmonary hypertension, unspecified: Secondary | ICD-10-CM | POA: Diagnosis not present

## 2021-10-07 DIAGNOSIS — I130 Hypertensive heart and chronic kidney disease with heart failure and stage 1 through stage 4 chronic kidney disease, or unspecified chronic kidney disease: Secondary | ICD-10-CM | POA: Diagnosis not present

## 2021-10-07 DIAGNOSIS — I340 Nonrheumatic mitral (valve) insufficiency: Secondary | ICD-10-CM | POA: Diagnosis not present

## 2021-10-07 LAB — RETIC PANEL
Immature Retic Fract: 10.9 % (ref 2.3–15.9)
RBC.: 4.32 MIL/uL (ref 3.87–5.11)
Retic Count, Absolute: 71.7 10*3/uL (ref 19.0–186.0)
Retic Ct Pct: 1.7 % (ref 0.4–3.1)
Reticulocyte Hemoglobin: 28.6 pg (ref >27.9)

## 2021-10-07 LAB — CBC WITH DIFFERENTIAL/PLATELET
Abs Immature Granulocytes: 0.03 10*3/uL (ref 0.00–0.07)
Basophils Absolute: 0.1 10*3/uL (ref 0.0–0.1)
Basophils Relative: 1 %
Eosinophils Absolute: 0.1 10*3/uL (ref 0.0–0.5)
Eosinophils Relative: 2 %
HCT: 39.2 % (ref 36.0–46.0)
Hemoglobin: 12.1 g/dL (ref 12.0–15.0)
Immature Granulocytes: 0 %
Lymphocytes Relative: 7 %
Lymphs Abs: 0.5 10*3/uL — ABNORMAL LOW (ref 0.7–4.0)
MCH: 27.9 pg (ref 26.0–34.0)
MCHC: 30.9 g/dL (ref 30.0–36.0)
MCV: 90.3 fL (ref 80.0–100.0)
Monocytes Absolute: 0.7 10*3/uL (ref 0.1–1.0)
Monocytes Relative: 10 %
Neutro Abs: 5.5 10*3/uL (ref 1.7–7.7)
Neutrophils Relative %: 80 %
Platelets: 174 10*3/uL (ref 150–400)
RBC: 4.34 MIL/uL (ref 3.87–5.11)
RDW: 17.2 % — ABNORMAL HIGH (ref 11.5–15.5)
WBC: 6.8 10*3/uL (ref 4.0–10.5)
nRBC: 0 % (ref 0.0–0.2)

## 2021-10-07 LAB — IRON AND TIBC
Iron: 44 ug/dL (ref 28–170)
Saturation Ratios: 11 % (ref 10.4–31.8)
TIBC: 406 ug/dL (ref 250–450)
UIBC: 362 ug/dL

## 2021-10-07 LAB — FERRITIN: Ferritin: 68 ng/mL (ref 11–307)

## 2021-10-07 LAB — VITAMIN B12: Vitamin B-12: 1081 pg/mL — ABNORMAL HIGH (ref 180–914)

## 2021-10-08 ENCOUNTER — Inpatient Hospital Stay: Payer: Medicare Other

## 2021-10-08 ENCOUNTER — Encounter: Payer: Self-pay | Admitting: Oncology

## 2021-10-08 ENCOUNTER — Inpatient Hospital Stay: Payer: Medicare Other | Attending: Oncology | Admitting: Oncology

## 2021-10-08 VITALS — BP 138/95 | HR 102 | Temp 98.9°F | Wt 115.0 lb

## 2021-10-08 DIAGNOSIS — I255 Ischemic cardiomyopathy: Secondary | ICD-10-CM | POA: Insufficient documentation

## 2021-10-08 DIAGNOSIS — G35 Multiple sclerosis: Secondary | ICD-10-CM | POA: Insufficient documentation

## 2021-10-08 DIAGNOSIS — I2510 Atherosclerotic heart disease of native coronary artery without angina pectoris: Secondary | ICD-10-CM | POA: Diagnosis not present

## 2021-10-08 DIAGNOSIS — D509 Iron deficiency anemia, unspecified: Secondary | ICD-10-CM | POA: Diagnosis not present

## 2021-10-08 DIAGNOSIS — Z885 Allergy status to narcotic agent status: Secondary | ICD-10-CM | POA: Diagnosis not present

## 2021-10-08 DIAGNOSIS — E538 Deficiency of other specified B group vitamins: Secondary | ICD-10-CM | POA: Insufficient documentation

## 2021-10-08 DIAGNOSIS — I5042 Chronic combined systolic (congestive) and diastolic (congestive) heart failure: Secondary | ICD-10-CM | POA: Diagnosis not present

## 2021-10-08 DIAGNOSIS — Z801 Family history of malignant neoplasm of trachea, bronchus and lung: Secondary | ICD-10-CM | POA: Diagnosis not present

## 2021-10-08 DIAGNOSIS — R58 Hemorrhage, not elsewhere classified: Secondary | ICD-10-CM | POA: Insufficient documentation

## 2021-10-08 DIAGNOSIS — Z87891 Personal history of nicotine dependence: Secondary | ICD-10-CM | POA: Insufficient documentation

## 2021-10-08 DIAGNOSIS — E611 Iron deficiency: Secondary | ICD-10-CM | POA: Insufficient documentation

## 2021-10-08 DIAGNOSIS — R1909 Other intra-abdominal and pelvic swelling, mass and lump: Secondary | ICD-10-CM | POA: Insufficient documentation

## 2021-10-08 DIAGNOSIS — Z8049 Family history of malignant neoplasm of other genital organs: Secondary | ICD-10-CM | POA: Insufficient documentation

## 2021-10-08 DIAGNOSIS — I252 Old myocardial infarction: Secondary | ICD-10-CM | POA: Diagnosis not present

## 2021-10-08 DIAGNOSIS — R110 Nausea: Secondary | ICD-10-CM | POA: Diagnosis not present

## 2021-10-08 DIAGNOSIS — Z888 Allergy status to other drugs, medicaments and biological substances status: Secondary | ICD-10-CM | POA: Diagnosis not present

## 2021-10-08 DIAGNOSIS — Z8249 Family history of ischemic heart disease and other diseases of the circulatory system: Secondary | ICD-10-CM | POA: Insufficient documentation

## 2021-10-08 DIAGNOSIS — D631 Anemia in chronic kidney disease: Secondary | ICD-10-CM | POA: Diagnosis not present

## 2021-10-08 DIAGNOSIS — R5383 Other fatigue: Secondary | ICD-10-CM | POA: Diagnosis not present

## 2021-10-08 DIAGNOSIS — J449 Chronic obstructive pulmonary disease, unspecified: Secondary | ICD-10-CM | POA: Insufficient documentation

## 2021-10-08 DIAGNOSIS — Z79899 Other long term (current) drug therapy: Secondary | ICD-10-CM | POA: Insufficient documentation

## 2021-10-08 DIAGNOSIS — Z808 Family history of malignant neoplasm of other organs or systems: Secondary | ICD-10-CM | POA: Diagnosis not present

## 2021-10-08 DIAGNOSIS — N1831 Chronic kidney disease, stage 3a: Secondary | ICD-10-CM | POA: Insufficient documentation

## 2021-10-08 DIAGNOSIS — K648 Other hemorrhoids: Secondary | ICD-10-CM | POA: Insufficient documentation

## 2021-10-08 DIAGNOSIS — K219 Gastro-esophageal reflux disease without esophagitis: Secondary | ICD-10-CM | POA: Insufficient documentation

## 2021-10-08 NOTE — Progress Notes (Signed)
Hematology/Oncology follow up note [facility] [phone] [phone]   Patient Care Team: [name], NP as PCP - General (Internal Medicine) Buford Dresser, MD as PCP - Cardiology (Cardiology) Earlie Server, MD as Consulting Physician (Hematology and Oncology)  REFERRING PROVIDER: [name], NP  CHIEF COMPLAINTS/REASON FOR VISIT:  Follow up of anemia  HISTORY OF PRESENTING ILLNESS:  Anita [name] is a  60 y.o.  female with PMH listed below who was referred to me for evaluation of anemia Reviewed patient's recent labs that was done.  05/03/2020 labs revealed anemia with hemoglobin of 8.9, MCV 82.9 07/02/2020, iron saturation 11, ferritin 91, TIBC 307, iron 34. Reviewed patient's previous labs , anemia is chronic onset , duration is since April 2021.  She did not have much baseline blood work prior to April 2021.  She did have some remote blood work that was done in 2012 when she had a normal hemoglobin of 13.5. Patient has multiple medical problems.  She is a poor historian.  Daughter provides most of her 32. Patient was hospitalized in April 2021 due to acute respiratory failure with hypoxia, due to atypical pneumonia, non-STEMI, severe three-vessel disease and pulmonary hypertension, AKI. 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 Eston Esters in 2007.  Patient declined pursuing further diagnosis or therapeutic options. She also has a chronic history of MS.  Patient was recently seen by gastroenterology and had colonoscopy done. 5 mm transverse colon polyp which was resected and retrieved.  Diverticulosis.  Nonbleeding internal hemorrhoids. Patient denies any bloody or black bowel movement.  She continues to have chronic nausea with no vomiting. Appetite is not good.  Weight has been relatively stable.   She also reports bilateral upper extremity ecchymosis which is a chronic issue for her.    INTERVAL HISTORY Anita [name] is a 60 y.o. female who has above history reviewed by me today presents for follow up visit for management of Anemia Problems and complaints are listed below: Patient was accompanied by her daughter.  Patient is taking iron supplementation 2 times daily.  She tolerates well. Most of the history is obtained from daughter.  Patient is a poor historian.  Review of Systems  Constitutional:  Positive for fatigue. Negative for appetite change, chills, fever and unexpected weight change.  HENT:   Negative for hearing loss and voice change.   Eyes:  Negative for eye problems.  Respiratory:  Negative for chest tightness and cough.   Cardiovascular:  Negative for chest pain.  Gastrointestinal:  Negative for abdominal distention, abdominal pain, blood in stool and nausea.  Endocrine: Negative for hot flashes.  Genitourinary:  Negative for difficulty urinating and frequency.   Musculoskeletal:  Negative for arthralgias.  Skin:  Negative for itching and rash.  Neurological:  Negative for extremity weakness.  Hematological:  Negative for adenopathy. Bruises/bleeds easily.  Psychiatric/Behavioral:  Negative for confusion.     MEDICAL HISTORY:  Past Medical History:  Diagnosis Date   Abnormality of gait 07/26/2013   Acute respiratory failure with hypoxia ([place]) 03/20/2020   Allergy    Arthritis    Atypical pneumonia 03/22/2020   CAD (coronary artery disease)    a. 03/2020 Cath: LM nl, LAD 60/41m, 55d, D1 70, D2 70, LCX 45ost/p, 65p, RCA 100ost CTO. RPDA fills via collats from dLAD. Inf septal fills via collats from 1st septal,  RPAV fills via collats from LPAV, RPL1/2 sev dzs-->med rx.   Chickenpox    Chronic combined systolic (congestive) and diastolic (congestive) heart failure ([place])    a. 02/2021 Echo: EF 40-45%, glob HK, gr2 DD. Sev red RV fxn, RVSP 69.9mmHg. Mod dil  LA, sev dil RA. Sev MR. Mild to mod MS. Mean MV grad 6.55mmHg. Mod-Sev TR. Mild AI. Mild Ao sclerosis w/o stenosis.   COPD (chronic obstructive pulmonary disease) (HCC)    Elevated troponin 03/22/2020   GERD (gastroesophageal reflux disease)    Headache(784.0)    Migraine   Hearing loss    Right ear secondary to infection   History of shingles    Ischemic cardiomyopathy    a. 02/2021 Echo: EF 40-45%, glob HK.   Migraines    Multiple sclerosis (HCC)    Obese    Optic neuritis    PAH (pulmonary artery hypertension) ([place])    a. 02/2021 Echo: RVSP 69.40mmHg.   Prolonged QT interval 03/20/2020   Severe mitral regurgitation    a. 02/2021 Echo: Severe MR w/ mild to mod MS.  Mean MV grad 6.56mmHg.    SURGICAL HISTORY: Past Surgical History:  Procedure Laterality Date   COLONOSCOPY WITH PROPOFOL N/A 08/03/2020   Procedure: COLONOSCOPY WITH PROPOFOL;  Surgeon: [name], MD;  Location: [facility] ENDOSCOPY;  Service: Gastroenterology;  Laterality: N/A;   Ganglioneuroma     Resection   LEFT HEART CATH AND CORONARY ANGIOGRAPHY N/A 03/23/2020   Procedure: LEFT HEART CATH AND CORONARY ANGIOGRAPHY;  Surgeon: Leonie Man, MD;  Location: [place] [facility];  Service: Cardiovascular;  Laterality: N/A;   PILONIDAL CYST EXCISION     PILONIDAL CYST EXCISION  1983   TUMOR REMOVAL  2007/2008    SOCIAL HISTORY: Social History   Socioeconomic History   Marital status: Divorced    Spouse name: Not on file   Number of children: Not on file   Years of education: Not on file   Highest education level: Not on file  Occupational History   Not on file  Tobacco Use   Smoking status: Former    Packs/day: 1.00    Types: Cigarettes   Smokeless tobacco: Never  Vaping Use   Vaping Use: Never used  Substance and Sexual Activity   Alcohol use: Yes    Comment: Occasional   Drug use: Not on file   Sexual activity: Not on file  Other Topics Concern   Not on file  Social History Narrative   Not on file    Social Determinants of Health   Financial Resource Strain: Not on file  Food Insecurity: Not on file  Transportation Needs: Not on file  Physical Activity: Not on file  Stress: Not on file  Social Connections: Not on file  Intimate Partner Violence: Not on file    FAMILY HISTORY: Family History  Problem Relation Age of Onset   Skin cancer Mother    Lung cancer Father    Uterine cancer Sister    Heart disease Brother    Bipolar disorder Sister    Throat cancer Paternal Grandmother     ALLERGIES:  is allergic to acthar hp [corticotropin], codeine, and prednisone.  MEDICATIONS:  Current Outpatient Medications  Medication Sig Dispense Refill   albuterol (VENTOLIN HFA) 108 (90 Base) MCG/ACT inhaler Inhale 1-2 puffs into the lungs every 6 (six) hours as needed for wheezing or shortness of breath. Use sparingly! 18 each 0   calcium carbonate (TUMS EX) 750 MG  chewable tablet Chew 1 tablet by mouth daily as needed for heartburn.     cetirizine (ZYRTEC) 10 MG tablet Take 10 mg by mouth daily.     diclofenac Sodium (VOLTAREN) 1 % GEL Apply 2 g topically 4 (four) times daily as needed (back or joint pain). 50 g 1   Doxylamine Succinate, Sleep, (SLEEP AID PO) Take 1 tablet by mouth at bedtime as needed (sleep).      feeding supplement, ENSURE ENLIVE, (ENSURE ENLIVE) LIQD Take 237 mLs by mouth 2 (two) times daily between meals.     ferrous sulfate 325 (65 FE) MG EC tablet TAKE 1 TABLET BY MOUTH 2 TIMES DAILY WITH A MEAL. 180 tablet 1   Fluticasone-Salmeterol (ADVAIR DISKUS) 250-50 MCG/DOSE AEPB Inhale 1 puff into the lungs 2 (two) times daily. 1 each 3   guaiFENesin (MUCINEX) 600 MG 12 hr tablet Take 600 mg by mouth 2 (two) times daily as needed for cough.     Multiple Vitamin (MULTIVITAMIN WITH MINERALS) TABS tablet Take 1 tablet by mouth daily.     nitroGLYCERIN (NITROSTAT) 0.4 MG SL tablet Place 1 tablet (0.4 mg total) under the tongue every 5 (five) minutes as needed for chest pain. 20  tablet 5   omeprazole (PRILOSEC) 40 MG capsule Take 1 capsule (40 mg total) by mouth daily. 90 capsule 3   Probiotic Product (PROBIOTIC ADVANCED) CAPS Take 1 capsule by mouth daily.     SUMAtriptan (IMITREX) 25 MG tablet Take 1 tablet by mouth at migraine onset, may repeat in 2 hours if headache persists or recurs. Do not exceed 2 tablets in 24 hours. 10 tablet 0   vitamin B-12 (CYANOCOBALAMIN) 1000 MCG tablet Take 1,000 mcg by mouth daily.     torsemide (DEMADEX) 20 MG tablet Take only as needed.  Take for weight gain of 2 lbs in 1 day or 5 lbs in 1 week or obvious swelling. (Patient taking differently: Take only as needed.  Take for weight gain of 2 lbs in 1 day or 5 lbs in 1 week or obvious swelling.) 30 tablet 0   No current facility-administered medications for this visit.     PHYSICAL EXAMINATION: ECOG PERFORMANCE STATUS: 2 - Symptomatic, <50% confined to bed Vitals:   10/08/21 1356  BP: (!) 138/95  Pulse: (!) 102  Temp: 98.9 F (37.2 C)   Filed Weights   10/08/21 1356  Weight: 115 lb (52.2 kg)    Physical Exam Constitutional:      General: She is not in acute distress. HENT:     Head: Normocephalic and atraumatic.  Eyes:     General: No scleral icterus. Cardiovascular:     Rate and Rhythm: Normal rate and regular rhythm.     Heart sounds: Murmur heard.  Pulmonary:     Effort: Pulmonary effort is normal. No respiratory distress.     Breath sounds: No wheezing.     Comments: Decreased breath sound bilaterally. Abdominal:     General: Bowel sounds are normal. There is no distension.     Palpations: Abdomen is soft.  Musculoskeletal:        General: No deformity. Normal range of motion.     Cervical back: Normal range of motion and neck supple.  Skin:    General: Skin is warm and dry.     Coloration: Skin is pale.     Comments: Bilateral upper extremity ecchymosis.  Neurological:     Mental Status: She is alert and oriented to  person, place, and time. Mental  status is at baseline.  Psychiatric:        Mood and Affect: Mood normal.     LABORATORY DATA:  I have reviewed the data as listed Lab Results  Component Value Date   WBC 6.8 10/07/2021   HGB 12.1 10/07/2021   HCT 39.2 10/07/2021   MCV 90.3 10/07/2021   PLT 174 10/07/2021   Recent Labs    06/09/21 0410 06/10/21 0403 06/11/21 0503 06/14/21 1549 06/28/21 0843  NA 135 136 136 139  --   K 3.8 3.9 4.3 4.6  --   CL 104 104 102 102  --   CO2 24 25 29 26   --   GLUCOSE 93 103* 95 87  --   BUN 28* 22* 20 17  --   CREATININE 1.22* 1.14* 1.13* 1.13*  --   CALCIUM 8.7* 8.7* 8.8* 9.0  --   GFRNONAA 51* 56* 56*  --   --   PROT 6.6 6.9 6.9  6.9 6.6 7.3  ALBUMIN 2.9* 3.0* 3.1*  3.1*  --  3.7  AST 127* 155* 177*  180* 54* 17  ALT 119* 142* 172*  172* 88* 14  ALKPHOS 261* 273* 245*  229*  --  120*  BILITOT 0.5 0.7 0.7  0.7 0.6 0.6  BILIDIR  --   --  0.2  --  0.2  IBILI  --   --  0.5  --   --     Iron/TIBC/Ferritin/ %Sat    Component Value Date/Time   IRON 44 10/07/2021 1453   IRON 34 07/02/2020 1620   TIBC 406 10/07/2021 1453   TIBC 307 07/02/2020 1620   FERRITIN 68 10/07/2021 1453   FERRITIN 91 07/02/2020 1620   IRONPCTSAT 11 10/07/2021 1453   IRONPCTSAT 11 (L) 07/02/2020 1620        ASSESSMENT & PLAN:  1. Anemia in stage 3a chronic kidney disease ([place])   2. Vitamin B12 deficiency   3. Iron deficiency anemia, unspecified iron deficiency anemia type    #Anemia, combination of iron deficiency as well as chronic kidney disease. Labs reviewed and discussed with patient. Patient's hemoglobin has improved to 12.1.  Iron saturation and ferritin has improved. Recommend patient to continue ferrous sulfate 325 mg twice daily.   History Vitamin B12 deficiency, continue vitamin B12 twice weekly.   All questions were answered. The patient knows to call the clinic with any problems questions or concerns. Roberts Gaudy, NP  Return of visit: 6 months.   Earlie Server, MD, PhD 10/08/2021

## 2021-10-14 DIAGNOSIS — I130 Hypertensive heart and chronic kidney disease with heart failure and stage 1 through stage 4 chronic kidney disease, or unspecified chronic kidney disease: Secondary | ICD-10-CM | POA: Diagnosis not present

## 2021-10-14 DIAGNOSIS — I5043 Acute on chronic combined systolic (congestive) and diastolic (congestive) heart failure: Secondary | ICD-10-CM | POA: Diagnosis not present

## 2021-10-14 DIAGNOSIS — I272 Pulmonary hypertension, unspecified: Secondary | ICD-10-CM | POA: Diagnosis not present

## 2021-10-14 DIAGNOSIS — R000 Tachycardia, unspecified: Secondary | ICD-10-CM | POA: Diagnosis not present

## 2021-10-14 DIAGNOSIS — I951 Orthostatic hypotension: Secondary | ICD-10-CM | POA: Diagnosis not present

## 2021-10-14 DIAGNOSIS — N1831 Chronic kidney disease, stage 3a: Secondary | ICD-10-CM | POA: Diagnosis not present

## 2021-10-14 DIAGNOSIS — I340 Nonrheumatic mitral (valve) insufficiency: Secondary | ICD-10-CM | POA: Diagnosis not present

## 2021-10-15 ENCOUNTER — Ambulatory Visit: Payer: Medicare Other

## 2021-10-22 ENCOUNTER — Ambulatory Visit: Payer: Medicare Other | Attending: Family | Admitting: Family

## 2021-10-22 ENCOUNTER — Other Ambulatory Visit: Payer: Self-pay

## 2021-10-22 ENCOUNTER — Encounter: Payer: Self-pay | Admitting: Family

## 2021-10-22 ENCOUNTER — Ambulatory Visit: Payer: Medicare Other

## 2021-10-22 VITALS — BP 130/82 | HR 102 | Resp 16 | Ht 63.0 in | Wt 134.0 lb

## 2021-10-22 DIAGNOSIS — K219 Gastro-esophageal reflux disease without esophagitis: Secondary | ICD-10-CM | POA: Insufficient documentation

## 2021-10-22 DIAGNOSIS — R079 Chest pain, unspecified: Secondary | ICD-10-CM | POA: Insufficient documentation

## 2021-10-22 DIAGNOSIS — R0602 Shortness of breath: Secondary | ICD-10-CM | POA: Insufficient documentation

## 2021-10-22 DIAGNOSIS — R002 Palpitations: Secondary | ICD-10-CM | POA: Insufficient documentation

## 2021-10-22 DIAGNOSIS — M549 Dorsalgia, unspecified: Secondary | ICD-10-CM | POA: Insufficient documentation

## 2021-10-22 DIAGNOSIS — I10 Essential (primary) hypertension: Secondary | ICD-10-CM | POA: Diagnosis not present

## 2021-10-22 DIAGNOSIS — G479 Sleep disorder, unspecified: Secondary | ICD-10-CM | POA: Insufficient documentation

## 2021-10-22 DIAGNOSIS — J449 Chronic obstructive pulmonary disease, unspecified: Secondary | ICD-10-CM | POA: Diagnosis not present

## 2021-10-22 DIAGNOSIS — I38 Endocarditis, valve unspecified: Secondary | ICD-10-CM | POA: Diagnosis not present

## 2021-10-22 DIAGNOSIS — R42 Dizziness and giddiness: Secondary | ICD-10-CM | POA: Diagnosis not present

## 2021-10-22 DIAGNOSIS — I340 Nonrheumatic mitral (valve) insufficiency: Secondary | ICD-10-CM | POA: Diagnosis not present

## 2021-10-22 DIAGNOSIS — I110 Hypertensive heart disease with heart failure: Secondary | ICD-10-CM | POA: Insufficient documentation

## 2021-10-22 DIAGNOSIS — Z87891 Personal history of nicotine dependence: Secondary | ICD-10-CM | POA: Insufficient documentation

## 2021-10-22 DIAGNOSIS — G35 Multiple sclerosis: Secondary | ICD-10-CM | POA: Diagnosis not present

## 2021-10-22 DIAGNOSIS — I5042 Chronic combined systolic (congestive) and diastolic (congestive) heart failure: Secondary | ICD-10-CM | POA: Diagnosis not present

## 2021-10-22 DIAGNOSIS — I2721 Secondary pulmonary arterial hypertension: Secondary | ICD-10-CM | POA: Insufficient documentation

## 2021-10-22 DIAGNOSIS — G8929 Other chronic pain: Secondary | ICD-10-CM | POA: Diagnosis not present

## 2021-10-22 DIAGNOSIS — Z09 Encounter for follow-up examination after completed treatment for conditions other than malignant neoplasm: Secondary | ICD-10-CM | POA: Diagnosis not present

## 2021-10-22 DIAGNOSIS — I2510 Atherosclerotic heart disease of native coronary artery without angina pectoris: Secondary | ICD-10-CM | POA: Diagnosis not present

## 2021-10-22 NOTE — Progress Notes (Signed)
NEUROLOGY CONSULTATION NOTE  Anita [name] MRN: [id number] DOB: June 08, 1961  Referring provider: Alma Friendly, NP Primary care provider: Alma Friendly, NP  Reason for consult:  multiple sclerosis  Assessment/Plan:   1  Multiple sclerosis - secondary progressive.  At this time, I think her MS is likely inactive and that her other comorbidities and deconditioning are the primary factors for her current weakness. Therefore, DMT may not be effective and will hold off for now.    2  Migraine without aura, without status migrainosus, not intractable 3  Insomnia  Check MRI brain and cervical spine with and without contrast to establish new baseline Consider melatonin at bedtime and avoid naps to help with insomnia.  Due to coronary artery disease, patient should discontinue sumatriptan Follow up 6 months.   Subjective:  Anita [name] is a 60 year old right-handed female with CAD, prolonged QT, severe mitral regurg, CHF and arthritis who presents for multiple sclerosis.  History supplemented by prior neurologist's and referring provider's notes.  She is accompanied by her daughter who provides further history.  Diagnosed with multiple sclerosis in May 2000 presenting as optic neuritis (right and then left).  For several years before then, she noted occasional paresthesias.  For several years (early 2001 to 2010) she declined being on DMT.  During that period, she clinically did well.  Occasionally she had balance problems and may have had another episode of optic neuritis.  She was on Tysabri in 2010 until about 2012.  Subsequently stopped due to positive JC Virus positive.  She reportedly had significant decline in 2014 with worsening gait in which she resorted to using a walker, mild cognitive impairment and occasional fecal incontinence.  She thought it was just another flare up but symptoms never resolved.    She has had a decline over the past year.  She has multiple  co-morbidities such as CHF and CAD.  She was in the hospital on at least 2 occasions over the past year with prolong rehab in which she was pretty much sedentary.    Vision:  Vision is blurred.  She has not had an eye exam for awhile Motor:  generalized weakness Sensory:  numbness and tingling in the right hand.  However may feel tingling and numbness in various extremities Pain:  Left lateral burning thigh pain Gait:  Requires walker.  Outside home, she uses wheelchair Bowel/Bladder:  Partial bowel and bladder incontinence Fatigue:  yes Cognition:  Mild cognitive impairment Mood:  okay Insomnia.  Naps off and on throughout the day.  Tried Benadryl which doesn't help.  Cannot take much due to cardiac comorbidities. Migraines:  Infrequent.   She has significant medical co-morbidities including COPD, rheumatic mitral regurgitation, CAD, orthostatic hypotension, combined systolic and diastolic heart failure, and iron-deficiency anemia  Current DMT:  None Past DMT:  Avonex (side effects), Copaxone, Tysabri (JC antibody positive)  Current medications:  midodrine, torsemide, sumatriptan 25mg , B12, ferrous sulfate, MVI Past medications:  H.P. Acthar Gel (throat swelling, hemoptysis), Solu-Medrol, mirtazapine   Imaging: 10/27/2003 MRI BRAIN W WO (personally reviewed):  increasing size and number of multiple intracranial lesions involving the periventricular subcortical white matter, consistent with progression of MS since previous study of April, 2002. 03/26/2001 MRI BRAIN W WO:  Small white matter lesions on the right are unchanged (compared to MRI from 04/05/99).  These lesions are suggestive but not diagnostic of multiple sclerosis.  Other causes of small vessel ischemic disease could also give this appearance, including  diabetes, hypertension, vasculitis, or migraine headaches. 04/05/1999 MRI BRAIN W WO:  There are multiple small nonspecific foci of increased T2 prolongation involving the  cerebral white matter.  In this young patient with reported clinically suspected optic neuritis multiple sclerosis is a consideration.  Further considerations include causes for small vessel disease including diabetes, hypertension, vasculitis, collagen vascular disease, and migraines.  No know family history of MS.     PAST MEDICAL HISTORY: Past Medical History:  Diagnosis Date   Abnormality of gait 07/26/2013   Acute respiratory failure with hypoxia ([place]) 03/20/2020   Allergy    Arthritis    Atypical pneumonia 03/22/2020   CAD (coronary artery disease)    a. 03/2020 Cath: LM nl, LAD 60/43m, 55d, D1 70, D2 70, LCX 45ost/p, 65p, RCA 100ost CTO. RPDA fills via collats from dLAD. Inf septal fills via collats from 1st septal, RPAV fills via collats from LPAV, RPL1/2 sev dzs-->med rx.   Chickenpox    Chronic combined systolic (congestive) and diastolic (congestive) heart failure ([place])    a. 02/2021 Echo: EF 40-45%, glob HK, gr2 DD. Sev red RV fxn, RVSP 69.107mmHg. Mod dil LA, sev dil RA. Sev MR. Mild to mod MS. Mean MV grad 6.4mmHg. Mod-Sev TR. Mild AI. Mild Ao sclerosis w/o stenosis.   COPD (chronic obstructive pulmonary disease) (HCC)    Elevated troponin 03/22/2020   GERD (gastroesophageal reflux disease)    Headache(784.0)    Migraine   Hearing loss    Right ear secondary to infection   History of shingles    Ischemic cardiomyopathy    a. 02/2021 Echo: EF 40-45%, glob HK.   Migraines    Multiple sclerosis (HCC)    Obese    Optic neuritis    PAH (pulmonary artery hypertension) ([place])    a. 02/2021 Echo: RVSP 69.58mmHg.   Prolonged QT interval 03/20/2020   Severe mitral regurgitation    a. 02/2021 Echo: Severe MR w/ mild to mod MS.  Mean MV grad 6.59mmHg.    PAST SURGICAL HISTORY: Past Surgical History:  Procedure Laterality Date   COLONOSCOPY WITH PROPOFOL N/A 08/03/2020   Procedure: COLONOSCOPY WITH PROPOFOL;  Surgeon: [name], MD;  Location: [facility] ENDOSCOPY;  Service:  Gastroenterology;  Laterality: N/A;   Ganglioneuroma     Resection   LEFT HEART CATH AND CORONARY ANGIOGRAPHY N/A 03/23/2020   Procedure: LEFT HEART CATH AND CORONARY ANGIOGRAPHY;  Surgeon: Leonie Man, MD;  Location: [place] [facility];  Service: Cardiovascular;  Laterality: N/A;   PILONIDAL CYST EXCISION     PILONIDAL CYST EXCISION  1983   TUMOR REMOVAL  2007/2008    MEDICATIONS: Current Outpatient Medications on File Prior to Visit  Medication Sig Dispense Refill   albuterol (VENTOLIN HFA) 108 (90 Base) MCG/ACT inhaler Inhale 1-2 puffs into the lungs every 6 (six) hours as needed for wheezing or shortness of breath. Use sparingly! 18 each 0   calcium carbonate (TUMS EX) 750 MG chewable tablet Chew 1 tablet by mouth daily as needed for heartburn.     cetirizine (ZYRTEC) 10 MG tablet Take 10 mg by mouth daily.     diclofenac Sodium (VOLTAREN) 1 % GEL Apply 2 g topically 4 (four) times daily as needed (back or joint pain). 50 g 1   Doxylamine Succinate, Sleep, (SLEEP AID PO) Take 1 tablet by mouth at bedtime as needed (sleep).      feeding supplement, ENSURE ENLIVE, (ENSURE ENLIVE) LIQD Take 237 mLs by mouth 2 (two) times  daily between meals.     ferrous sulfate 325 (65 FE) MG EC tablet TAKE 1 TABLET BY MOUTH 2 TIMES DAILY WITH A MEAL. 180 tablet 1   Fluticasone-Salmeterol (ADVAIR DISKUS) 250-50 MCG/DOSE AEPB Inhale 1 puff into the lungs 2 (two) times daily. 1 each 3   guaiFENesin (MUCINEX) 600 MG 12 hr tablet Take 600 mg by mouth 2 (two) times daily as needed for cough.     Multiple Vitamin (MULTIVITAMIN WITH MINERALS) TABS tablet Take 1 tablet by mouth daily.     nitroGLYCERIN (NITROSTAT) 0.4 MG SL tablet Place 1 tablet (0.4 mg total) under the tongue every 5 (five) minutes as needed for chest pain. 20 tablet 5   omeprazole (PRILOSEC) 40 MG capsule Take 1 capsule (40 mg total) by mouth daily. 90 capsule 3   Probiotic Product (PROBIOTIC ADVANCED) CAPS Take 1 capsule by mouth daily.      SUMAtriptan (IMITREX) 25 MG tablet Take 1 tablet by mouth at migraine onset, may repeat in 2 hours if headache persists or recurs. Do not exceed 2 tablets in 24 hours. 10 tablet 0   torsemide (DEMADEX) 20 MG tablet Take only as needed.  Take for weight gain of 2 lbs in 1 day or 5 lbs in 1 week or obvious swelling. (Patient taking differently: Take only as needed.  Take for weight gain of 2 lbs in 1 day or 5 lbs in 1 week or obvious swelling.) 30 tablet 0   vitamin B-12 (CYANOCOBALAMIN) 1000 MCG tablet Take 1,000 mcg by mouth daily.     No current facility-administered medications on file prior to visit.    ALLERGIES: Allergies  Allergen Reactions   Acthar Hp [Corticotropin] Other (See Comments)    Throat swelling and coughing up blood   Codeine    Prednisone     FAMILY HISTORY: Family History  Problem Relation Age of Onset   Skin cancer Mother    Lung cancer Father    Uterine cancer Sister    Heart disease Brother    Bipolar disorder Sister    Throat cancer Paternal Grandmother     Objective:  Blood pressure (!) 148/103, pulse (!) 113, height 5\' 3"  (1.6 m), weight 134 lb (60.8 kg), SpO2 95 %. General: No acute distress.  Patient appears well-groomed.   Head:  Normocephalic/atraumatic Eyes:  fundi examined but not visualized Neck: supple, no paraspinal tenderness, full range of motion Back: No paraspinal tenderness Heart: regular rate and rhythm Lungs: Clear to auscultation bilaterally. Vascular: No carotid bruits. Neurological Exam: Mental status: alert and oriented to person, place, and time, recent and remote memory intact, fund of knowledge intact, attention and concentration intact, speech fluent and not dysarthric, language intact. Cranial nerves: CN I: not tested CN II: pupils equal, round and reactive to light, visual fields intact CN III, IV, VI:  full range of motion, no nystagmus, no ptosis CN V: facial sensation intact. CN VII: upper and lower face symmetric CN  VIII: hearing intact CN IX, X: gag intact, uvula midline CN XI: sternocleidomastoid and trapezius muscles intact CN XII: tongue midline Bulk & Tone: normal, no fasciculations. Motor:  muscle strength 5-/5 throughout Sensation:  Pinprick sensation intact, vibratory sensation reduced in feet Deep Tendon Reflexes:  2+ throughout,  toes downgoing.   Finger to nose testing:  Without dysmetria.   Gait:  In wheelchair.  Requires assistance to stand.  Broad-based gait.  Short stride. Unsteady.  Cannot ambulate without assistance    Thank you  for allowing me to take part in the care of this patient.  Metta Clines, DO  CC: Alma Friendly, NP

## 2021-10-22 NOTE — Progress Notes (Signed)
Patient ID: Anita [name], female    DOB: August 04, 1961, 60 y.o.   MRN: [id number]  HPI  Ms Tedder is a 60 y/o female with a history of CAD, COPD, GERD, multiple sclerosis, pulmonary HTN, severe valvular disease, previous tobacco use and chronic heart failure.   Echo report from 02/07/21 reviewed and showed an EF of 40-45% along with severely elevated PA pressure of 69.2 mmHg, moderate LAE, severe MR, mild/ moderate MS and moderate/severe TR.   LHC done 03/23/20 showed: Hemodynamics: LV end diastolic pressure is moderately elevated. ----Coronary Angiography----- Ost RCA to Dist RCA lesion is 100% stenosed.-The PDA and PL system fills via faint collaterals from the AV groove LCx and LAD septals. Mid LAD-1 lesion is 60% stenosed. Mid LAD-2 lesion is 50% stenosed. Dist LAD lesion is 55% stenosed with 70% stenosed side branch in 2nd Diag. 1st Diag lesion is 70% stenosed. Ost Cx to Prox Cx lesion is 45% stenosed. Prox-MID Cx lesion is 65% stenosed.   MODERATE-SEVERE THREE-VESSEL CAD: 100% proximal RCA occlusion with left-to-right collaterals faintly filling PDA and PL system (AV groove LCx-PL and LAD septal-PDA) Diffuse moderate mid LAD disease with 60% to 50% stenosis. Tandem 50% and 65% proximal and mid LCx with the 65% lesion being the most significant lesion. Moderately elevated LVEDP of 18-20 mmHg  Admitted 06/04/21 due to hypotension. Found to have AKI. Cardiology consult obtained. HF meds held and midodrine started. Gentle IVF given. Elevated LFT's noted & crestor & tylenol held. Discharged after 7 days. Admitted 05/18/21 due to acute on chronic HF. Initially given IV lasix with transition to oral diuretics with ~ 25L lost. Cardiology consult obtained. Discharged after 10 days.   She presents today for a follow-up visit with a chief complaint of moderate shortness of breath with very little exertion. Describes this as chronic in nature having been present for several years. She has  associated fatigue, palpitations, chest pain (last night), difficulty sleeping, chronic back pain, light-headedness and gradual weight gain along with this.   She says that she wore a zio monitor a few weeks ago but hasn't heard about the results yet. She says that last night her HR was 174 and stayed that high for a couple of hours. She says that she was just sitting still not doing anything when it was that high.   Past Medical History:  Diagnosis Date   Abnormality of gait 07/26/2013   Acute respiratory failure with hypoxia ([place]) 03/20/2020   Allergy    Arthritis    Atypical pneumonia 03/22/2020   CAD (coronary artery disease)    a. 03/2020 Cath: LM nl, LAD 60/48m, 55d, D1 70, D2 70, LCX 45ost/p, 65p, RCA 100ost CTO. RPDA fills via collats from dLAD. Inf septal fills via collats from 1st septal, RPAV fills via collats from LPAV, RPL1/2 sev dzs-->med rx.   Chickenpox    Chronic combined systolic (congestive) and diastolic (congestive) heart failure ([place])    a. 02/2021 Echo: EF 40-45%, glob HK, gr2 DD. Sev red RV fxn, RVSP 69.74mmHg. Mod dil LA, sev dil RA. Sev MR. Mild to mod MS. Mean MV grad 6.52mmHg. Mod-Sev TR. Mild AI. Mild Ao sclerosis w/o stenosis.   COPD (chronic obstructive pulmonary disease) (HCC)    Elevated troponin 03/22/2020   GERD (gastroesophageal reflux disease)    Headache(784.0)    Migraine   Hearing loss    Right ear secondary to infection   History of shingles    Ischemic cardiomyopathy    a.  02/2021 Echo: EF 40-45%, glob HK.   Migraines    Multiple sclerosis (H[facility])    Obese    Optic neuritis    PAH (pulmonary artery hypertension) ([place])    a. 02/2021 Echo: RVSP 69.110mmHg.   Prolonged QT interval 03/20/2020   Severe mitral regurgitation    a. 02/2021 Echo: Severe MR w/ mild to mod MS.  Mean MV grad 6.32mmHg.   Past Surgical History:  Procedure Laterality Date   COLONOSCOPY WITH PROPOFOL N/A 08/03/2020   Procedure: COLONOSCOPY WITH PROPOFOL;  Surgeon: [name], MD;   Location: [facility] ENDOSCOPY;  Service: Gastroenterology;  Laterality: N/A;   Ganglioneuroma     Resection   LEFT HEART CATH AND CORONARY ANGIOGRAPHY N/A 03/23/2020   Procedure: LEFT HEART CATH AND CORONARY ANGIOGRAPHY;  Surgeon: Leonie Man, MD;  Location: [facility];  Service: Cardiovascular;  Laterality: N/A;   PILONIDAL CYST EXCISION     PILONIDAL CYST EXCISION  1983   TUMOR REMOVAL  2007/2008   Family History  Problem Relation Age of Onset   Skin cancer Mother    Lung cancer Father    Uterine cancer Sister    Heart disease Brother    Bipolar disorder Sister    Throat cancer Paternal Grandmother    Social History   Tobacco Use   Smoking status: Former    Packs/day: 1.00    Types: Cigarettes   Smokeless tobacco: Never  Substance Use Topics   Alcohol use: Yes    Comment: Occasional   Allergies  Allergen Reactions   Acthar Hp [Corticotropin] Other (See Comments)    Throat swelling and coughing up blood   Codeine    Prednisone    Prior to Admission medications   Medication Sig Start Date End Date Taking? Authorizing Provider  albuterol (VENTOLIN HFA) 108 (90 Base) MCG/ACT inhaler Inhale 1-2 puffs into the lungs every 6 (six) hours as needed for wheezing or shortness of breath. Use sparingly! 06/14/21  Yes [name], NP  calcium carbonate (TUMS EX) 750 MG chewable tablet Chew 1 tablet by mouth daily as needed for heartburn.   Yes [provider]  cetirizine (ZYRTEC) 10 MG tablet Take 10 mg by mouth daily.   Yes [provider]  diclofenac Sodium (VOLTAREN) 1 % GEL Apply 2 g topically 4 (four) times daily as needed (back or joint pain). 06/11/21  Yes Elodia Florence., MD  Doxylamine Succinate, Sleep, (SLEEP AID PO) Take 1 tablet by mouth at bedtime as needed (sleep).    Yes [provider]  feeding supplement, ENSURE ENLIVE, (ENSURE ENLIVE) LIQD Take 237 mLs by mouth 2 (two) times daily between meals. 03/28/20  Yes Barton Dubois,  MD  ferrous sulfate 325 (65 FE) MG EC tablet TAKE 1 TABLET BY MOUTH 2 TIMES DAILY WITH A MEAL. Patient taking differently: 3 (three) times daily with meals. 05/28/21  Yes Earlie Server, MD  Fluticasone-Salmeterol (ADVAIR DISKUS) 250-50 MCG/DOSE AEPB Inhale 1 puff into the lungs 2 (two) times daily. 01/08/21  Yes [name], NP  guaiFENesin (MUCINEX) 600 MG 12 hr tablet Take 600 mg by mouth 2 (two) times daily as needed for cough.   Yes [provider]  Multiple Vitamin (MULTIVITAMIN WITH MINERALS) TABS tablet Take 1 tablet by mouth daily. 03/29/20  Yes Barton Dubois, MD  nitroGLYCERIN (NITROSTAT) 0.4 MG SL tablet Place 1 tablet (0.4 mg total) under the tongue every 5 (five) minutes as needed for chest pain. 08/02/21  Yes [name] A, FNP  omeprazole (PRILOSEC) 40 MG capsule Take 1 capsule (40 mg total) by mouth daily. 03/04/21  Yes [name], MD  Probiotic Product (PROBIOTIC ADVANCED) CAPS Take 1 capsule by mouth daily.   Yes [provider]  SUMAtriptan (IMITREX) 25 MG tablet Take 1 tablet by mouth at migraine onset, may repeat in 2 hours if headache persists or recurs. Do not exceed 2 tablets in 24 hours. 02/15/18  Yes [name], NP  torsemide (DEMADEX) 20 MG tablet Take only as needed.  Take for weight gain of 2 lbs in 1 day or 5 lbs in 1 week or obvious swelling. Patient taking differently: Take only as needed.  Take for weight gain of 2 lbs in 1 day or 5 lbs in 1 week or obvious swelling. 06/11/21  Yes Elodia Florence., MD  vitamin B-12 (CYANOCOBALAMIN) 1000 MCG tablet Take 1,000 mcg by mouth daily.   Yes [provider]   Review of Systems  Constitutional:  Positive for fatigue. Negative for appetite change.  HENT:  Negative for congestion, postnasal drip and sore throat.   Eyes: Negative.   Respiratory:  Positive for shortness of breath. Negative for cough and chest tightness.   Cardiovascular:  Positive for chest pain (last night) and palpitations  (last night). Negative for leg swelling.  Gastrointestinal:  Negative for abdominal distention and abdominal pain.  Endocrine: Negative.   Genitourinary: Negative.   Musculoskeletal:  Positive for back pain. Negative for neck pain.  Skin: Negative.   Allergic/Immunologic: Negative.   Neurological:  Positive for light-headedness ("all the time"). Negative for dizziness.  Hematological:  Negative for adenopathy. Does not bruise/bleed easily.  Psychiatric/Behavioral:  Positive for sleep disturbance (interrupted sleep pattern; sleeping on 2 pillows). Negative for dysphoric mood.    Vitals:   10/22/21 1234 10/22/21 1251  BP: (!) 137/105 130/82  Pulse: (!) 102   Resp: 16   SpO2: 98%   Weight: 134 lb (60.8 kg)   Height: 5\' 3"  (1.6 m)    Wt Readings from Last 3 Encounters:  10/22/21 134 lb (60.8 kg)  10/08/21 115 lb (52.2 kg)  10/01/21 112 lb 9.6 oz (51.1 kg)   Lab Results  Component Value Date   CREATININE 1.13 (H) 06/14/2021   CREATININE 1.13 (H) 06/11/2021   CREATININE 1.14 (H) 06/10/2021   Physical Exam Vitals and nursing note reviewed. Exam conducted with a chaperone present (daughter).  Constitutional:      Appearance: Normal appearance.  HENT:     Head: Normocephalic and atraumatic.  Cardiovascular:     Rate and Rhythm: Regular rhythm. Tachycardia present.     Heart sounds: Murmur heard.  Pulmonary:     Effort: Pulmonary effort is normal. No respiratory distress.     Breath sounds: No wheezing or rales.  Abdominal:     General: There is no distension.     Palpations: Abdomen is soft.     Tenderness: There is no abdominal tenderness.  Musculoskeletal:        General: No tenderness.     Right lower leg: No edema.     Left lower leg: No edema.  Skin:    General: Skin is warm and dry.  Neurological:     General: No focal deficit present.     Mental Status: She is alert and oriented to person, place, and time. Mental status is at baseline.  Psychiatric:        Mood  and Affect:  Mood normal.        Behavior: Behavior normal.        Thought Content: Thought content normal.   Assessment & Plan:  1: Chronic heart failure with reduced ejection fraction- - NYHA class III - euvolemic today - weighing daily; reminded to call for an overnight weight gain of > 2 pounds or a weekly weight gain of > 5 pounds - she has gained 11 pounds since her last visit here 3 months ago - not adding salt and daughter says that she doesn't cook with salt either - saw cardiology Harrell Gave) 10/01/21 - had palliative care visit 08/29/21 - BNP 06/04/21 was 930.0  2: HTN- - BP initially elevated (137/105) but had improved after rechecking with manual cuff without her jacket on (130/82) - she says that the lower BP lines up more with her home BP's - saw PCP Carlis Abbott) 06/14/21 - BMP 06/14/21 reviewed and showed sodium 139, potassium 4.6, creatinine 1.13   3: Valvular disease- - not a candidate for surgical mitral clip procedure - recently wore zio monitor but hasn't heard about the results; have messaged cardiology regarding this and they will f/u with patient   Medication list reviewed.   Due to numerous other provider appointments and stable HF will not make a return appointment at this time. Emphasized that she follow closely with cardiology and that she could call back at anytime for any questions or to schedule another appointment and she was comfortable with this plan.

## 2021-10-22 NOTE — Patient Instructions (Addendum)
Continue weighing daily and call for an overnight weight gain of > 2 pounds or a weekly weight gain of >5 pounds.     Call us in the future for any questions or to schedule another appointment

## 2021-10-23 ENCOUNTER — Encounter: Payer: Self-pay | Admitting: Neurology

## 2021-10-23 ENCOUNTER — Ambulatory Visit (INDEPENDENT_AMBULATORY_CARE_PROVIDER_SITE_OTHER): Payer: Medicare Other | Admitting: Neurology

## 2021-10-23 ENCOUNTER — Other Ambulatory Visit: Payer: Medicare Other | Admitting: Nurse Practitioner

## 2021-10-23 ENCOUNTER — Other Ambulatory Visit: Payer: Self-pay

## 2021-10-23 ENCOUNTER — Encounter: Payer: Self-pay | Admitting: Nurse Practitioner

## 2021-10-23 VITALS — BP 148/103 | HR 113 | Ht 63.0 in | Wt 134.0 lb

## 2021-10-23 DIAGNOSIS — I255 Ischemic cardiomyopathy: Secondary | ICD-10-CM | POA: Diagnosis not present

## 2021-10-23 DIAGNOSIS — G43009 Migraine without aura, not intractable, without status migrainosus: Secondary | ICD-10-CM

## 2021-10-23 DIAGNOSIS — G4700 Insomnia, unspecified: Secondary | ICD-10-CM | POA: Diagnosis not present

## 2021-10-23 DIAGNOSIS — I509 Heart failure, unspecified: Secondary | ICD-10-CM | POA: Diagnosis not present

## 2021-10-23 DIAGNOSIS — G4701 Insomnia due to medical condition: Secondary | ICD-10-CM | POA: Diagnosis not present

## 2021-10-23 DIAGNOSIS — G35 Multiple sclerosis: Secondary | ICD-10-CM

## 2021-10-23 DIAGNOSIS — Z515 Encounter for palliative care: Secondary | ICD-10-CM | POA: Diagnosis not present

## 2021-10-23 DIAGNOSIS — R0602 Shortness of breath: Secondary | ICD-10-CM | POA: Diagnosis not present

## 2021-10-23 NOTE — Progress Notes (Signed)
[facility] Consult Note Telephone: [phone]  Fax: [phone]    Date of encounter: 10/23/21 3:12 PM PATIENT NAME: Anita [name] [name] [address] [place] [address]   [phone] (home)  DOB: [birth year] MRN: [id number] PRIMARY CARE PROVIDER:    [name], NP,  [address] [place] [address] [phone]  RESPONSIBLE PARTY:    Contact Information     Name Relation Home Work Mobile   [place] Daughter [phone]        I met face to face with patient in home. Palliative Care was asked to follow this patient by consultation request of  [name], NP to address advance care planning and complex medical decision making. This is a follow up visit.                                  ASSESSMENT AND PLAN / RECOMMENDATIONS:  Symptom Management/Plan: 1. Advance Care Planning; Full code, will continue to discussions   2. Shortness of breath secondary severe mitral regurgitation, heart failure not surgical candidate; continue with cardiology visits, awaiting holter monitor results.     3. Generalized weakness secondary to Anita [name], continue fall precautions; use walker, discussed fall risk. Continue to encourage self independence as able  4. Insomnia, sleep patterns. Anita [name] [name] sleeps 2 hours at night. We walked about sleep hygiene, sleep aids. Anita [name] [name] endorses she has tried sleep aids without improvement. We talked about alternative. Continue to encourage healthy sleeping habits, would benefit tachycardia. We talked about overall fatigue, malaise when one's body is working harder due to lack of sleep. Anita [name] [name] endorses she understood.    5. Palliative care encounter; Palliative care encounter; Palliative medicine team will continue to support patient, patient's family, and medical team. Visit consisted of counseling and education dealing with the complex and emotionally intense  issues of symptom management and palliative care in the setting of serious and potentially life-threatening illness  Follow up Palliative Care Visit: Palliative care will continue to follow for complex medical decision making, advance care planning, and clarification of goals. Return 8 weeks or prn.  I spent 63 minutes providing this consultation. More than 50% of the time in this consultation was spent in counseling and care coordination.  PPS: 50%  Chief Complaint: Follow up palliative consult for complex medical decision making  HISTORY OF PRESENT ILLNESS:  Anita [name] [name] is a 60 y.o. year old female  with multiple medical problems including hx of Anita [name], CAD, severe MR with moderate mitral stenosis, COPD, tobacco use, systolic and diastolic heart failure. I called Anita [name] [name] daughter [name] to confirm PC initial consult, in agreement with covid negative screening. I visited Anita [name] [name] in her home, she was in her bedroom, sitting on the bed, preferred place to do PC visit.  We talked about how she has been feeling. Anita [name] [name] endorses she takes it a day at a time. Anita [name] [name] endorses she had an appointment at [facility]F clinic yesterday, upcoming cardiology with awaiting hearing about holter monitor results. Anita [name] [name] endorses she has a neurology appointment today. We talked about ros, symptoms. We talked about progressive Anita [name] including more difficulty with speech, slower and more difficulty processing thoughts. We talked about insomnia, sleep patterns. Anita [name] [place] endorses she only sleeps 2 hours at night. We walked about sleep hygiene, sleep aids. Anita [name] [name] endorses she has tried sleep  aids without improvement. We talked about alternative. Continue to encourage healthy sleeping habits, would benefit tachycardia. We talked about overall fatigue, malaise when one's body is working harder due to lack of sleep. Anita [name] [name] endorses she understood. We talked about appetite, which  has been good, no recent weight loss. We talked about symptoms of shortness of breath with exertion. Anita [name] [name] endorses she has had one episode where her heart rate went up to 170 then after a bit of time back to normal. We talked about pending holter monitor results. We talked about medical goals. We talked about role pc. We talked about f/u pc visit, Anita [name] [name] in agreement, scheduled. We talked about progression of Anita [name], expectations, impact on her adult children with chronic disease progression. We talked about functional and cognitive changes. We talked about coping strategies. We talked about caregiver fatigue concerning her daughter, [name]. We talked about how Anita [name] [name] shares her experiences with [name]. Therapeutic listening, emotional support provided. We talked about quality of life. Questions answered.   History obtained from review of EMR, discussion with  Anita [name] [name].  I reviewed available labs, medications, imaging, studies and related documents from the EMR.  Records reviewed and summarized above.   ROS Full 10 system review of systems performed and negative with exception of: as per HPI.   Physical Exam: Constitutional: NAD General: pleasant female EYES: anicteric sclera, lids intact, no discharge  ENMT: oral mucous membranes moist CV: S1S2, RRR Pulmonary: decrease bases, otherwise clear, + increased work of breathing with exertion, + cough, room air; Abdomen:  normo-active BS + 4 quadrants, soft and non tender MSK: ambulatory with walker Skin: warm and dry Neuro:  + generalized weakness,  + cognitive impairment; slow speech Psych: non-anxious affect, A and O x 3  Questions and concerns were addressed. The patient/family was encouraged to call with questions and/or concerns. My business card was provided. Provided general support and encouragement, no other unmet needs identified   Thank you for the opportunity to participate in the care of Anita [name] [name].   The palliative care team will continue to follow. Please call our office at [phone] if we can be of additional assistance.   This chart was dictated using voice recognition software.  Despite best efforts to proofread,  errors can occur which can change the documentation meaning.   Dedee Liss Z Jakim Drapeau, NP   COVID-19 PATIENT SCREENING TOOL Asked and negative response unless otherwise noted:   Have you had symptoms of covid, tested positive or been in contact with someone with symptoms/positive test in the past 5-10 days?  No

## 2021-10-23 NOTE — Patient Instructions (Signed)
Check MRI of brain and cervical spine with and without contrast Stop sumatriptan Try to not nap during the day.  If okay with cardiology, try melatonin Follow up 6 months.

## 2021-10-28 DIAGNOSIS — I130 Hypertensive heart and chronic kidney disease with heart failure and stage 1 through stage 4 chronic kidney disease, or unspecified chronic kidney disease: Secondary | ICD-10-CM | POA: Diagnosis not present

## 2021-10-28 DIAGNOSIS — G35 Multiple sclerosis: Secondary | ICD-10-CM | POA: Diagnosis not present

## 2021-10-28 DIAGNOSIS — Z7982 Long term (current) use of aspirin: Secondary | ICD-10-CM | POA: Diagnosis not present

## 2021-10-28 DIAGNOSIS — N1831 Chronic kidney disease, stage 3a: Secondary | ICD-10-CM | POA: Diagnosis not present

## 2021-10-28 DIAGNOSIS — I5043 Acute on chronic combined systolic (congestive) and diastolic (congestive) heart failure: Secondary | ICD-10-CM | POA: Diagnosis not present

## 2021-10-28 DIAGNOSIS — Z87891 Personal history of nicotine dependence: Secondary | ICD-10-CM | POA: Diagnosis not present

## 2021-10-28 DIAGNOSIS — J449 Chronic obstructive pulmonary disease, unspecified: Secondary | ICD-10-CM | POA: Diagnosis not present

## 2021-10-28 DIAGNOSIS — I272 Pulmonary hypertension, unspecified: Secondary | ICD-10-CM | POA: Diagnosis not present

## 2021-10-28 DIAGNOSIS — I255 Ischemic cardiomyopathy: Secondary | ICD-10-CM | POA: Diagnosis not present

## 2021-10-28 DIAGNOSIS — I2510 Atherosclerotic heart disease of native coronary artery without angina pectoris: Secondary | ICD-10-CM | POA: Diagnosis not present

## 2021-10-28 DIAGNOSIS — I951 Orthostatic hypotension: Secondary | ICD-10-CM | POA: Diagnosis not present

## 2021-10-28 DIAGNOSIS — I340 Nonrheumatic mitral (valve) insufficiency: Secondary | ICD-10-CM | POA: Diagnosis not present

## 2021-11-13 ENCOUNTER — Other Ambulatory Visit: Payer: Self-pay | Admitting: Primary Care

## 2021-11-13 DIAGNOSIS — J439 Emphysema, unspecified: Secondary | ICD-10-CM

## 2021-11-15 DIAGNOSIS — I272 Pulmonary hypertension, unspecified: Secondary | ICD-10-CM | POA: Diagnosis not present

## 2021-11-15 DIAGNOSIS — I951 Orthostatic hypotension: Secondary | ICD-10-CM | POA: Diagnosis not present

## 2021-11-15 DIAGNOSIS — I340 Nonrheumatic mitral (valve) insufficiency: Secondary | ICD-10-CM | POA: Diagnosis not present

## 2021-11-15 DIAGNOSIS — I5043 Acute on chronic combined systolic (congestive) and diastolic (congestive) heart failure: Secondary | ICD-10-CM | POA: Diagnosis not present

## 2021-11-15 DIAGNOSIS — I130 Hypertensive heart and chronic kidney disease with heart failure and stage 1 through stage 4 chronic kidney disease, or unspecified chronic kidney disease: Secondary | ICD-10-CM | POA: Diagnosis not present

## 2021-11-15 DIAGNOSIS — N1831 Chronic kidney disease, stage 3a: Secondary | ICD-10-CM | POA: Diagnosis not present

## 2021-11-18 DIAGNOSIS — I5043 Acute on chronic combined systolic (congestive) and diastolic (congestive) heart failure: Secondary | ICD-10-CM | POA: Diagnosis not present

## 2021-11-18 DIAGNOSIS — I951 Orthostatic hypotension: Secondary | ICD-10-CM | POA: Diagnosis not present

## 2021-11-18 DIAGNOSIS — I272 Pulmonary hypertension, unspecified: Secondary | ICD-10-CM | POA: Diagnosis not present

## 2021-11-18 DIAGNOSIS — I340 Nonrheumatic mitral (valve) insufficiency: Secondary | ICD-10-CM | POA: Diagnosis not present

## 2021-11-18 DIAGNOSIS — I130 Hypertensive heart and chronic kidney disease with heart failure and stage 1 through stage 4 chronic kidney disease, or unspecified chronic kidney disease: Secondary | ICD-10-CM | POA: Diagnosis not present

## 2021-11-18 DIAGNOSIS — N1831 Chronic kidney disease, stage 3a: Secondary | ICD-10-CM | POA: Diagnosis not present

## 2021-11-20 ENCOUNTER — Other Ambulatory Visit: Payer: Self-pay | Admitting: Oncology

## 2021-11-26 DIAGNOSIS — N1831 Chronic kidney disease, stage 3a: Secondary | ICD-10-CM | POA: Diagnosis not present

## 2021-11-26 DIAGNOSIS — I340 Nonrheumatic mitral (valve) insufficiency: Secondary | ICD-10-CM | POA: Diagnosis not present

## 2021-11-26 DIAGNOSIS — I130 Hypertensive heart and chronic kidney disease with heart failure and stage 1 through stage 4 chronic kidney disease, or unspecified chronic kidney disease: Secondary | ICD-10-CM | POA: Diagnosis not present

## 2021-11-26 DIAGNOSIS — I272 Pulmonary hypertension, unspecified: Secondary | ICD-10-CM | POA: Diagnosis not present

## 2021-11-26 DIAGNOSIS — I951 Orthostatic hypotension: Secondary | ICD-10-CM | POA: Diagnosis not present

## 2021-11-26 DIAGNOSIS — I5043 Acute on chronic combined systolic (congestive) and diastolic (congestive) heart failure: Secondary | ICD-10-CM | POA: Diagnosis not present

## 2021-11-27 DIAGNOSIS — I130 Hypertensive heart and chronic kidney disease with heart failure and stage 1 through stage 4 chronic kidney disease, or unspecified chronic kidney disease: Secondary | ICD-10-CM | POA: Diagnosis not present

## 2021-11-27 DIAGNOSIS — I255 Ischemic cardiomyopathy: Secondary | ICD-10-CM | POA: Diagnosis not present

## 2021-11-27 DIAGNOSIS — Z7982 Long term (current) use of aspirin: Secondary | ICD-10-CM | POA: Diagnosis not present

## 2021-11-27 DIAGNOSIS — Z8701 Personal history of pneumonia (recurrent): Secondary | ICD-10-CM | POA: Diagnosis not present

## 2021-11-27 DIAGNOSIS — E785 Hyperlipidemia, unspecified: Secondary | ICD-10-CM | POA: Diagnosis not present

## 2021-11-27 DIAGNOSIS — J439 Emphysema, unspecified: Secondary | ICD-10-CM | POA: Diagnosis not present

## 2021-11-27 DIAGNOSIS — G40909 Epilepsy, unspecified, not intractable, without status epilepticus: Secondary | ICD-10-CM | POA: Diagnosis not present

## 2021-11-27 DIAGNOSIS — I272 Pulmonary hypertension, unspecified: Secondary | ICD-10-CM | POA: Diagnosis not present

## 2021-11-27 DIAGNOSIS — I252 Old myocardial infarction: Secondary | ICD-10-CM | POA: Diagnosis not present

## 2021-11-27 DIAGNOSIS — R1319 Other dysphagia: Secondary | ICD-10-CM | POA: Diagnosis not present

## 2021-11-27 DIAGNOSIS — Z87440 Personal history of urinary (tract) infections: Secondary | ICD-10-CM | POA: Diagnosis not present

## 2021-11-27 DIAGNOSIS — G35 Multiple sclerosis: Secondary | ICD-10-CM | POA: Diagnosis not present

## 2021-11-27 DIAGNOSIS — N1831 Chronic kidney disease, stage 3a: Secondary | ICD-10-CM | POA: Diagnosis not present

## 2021-11-27 DIAGNOSIS — I2510 Atherosclerotic heart disease of native coronary artery without angina pectoris: Secondary | ICD-10-CM | POA: Diagnosis not present

## 2021-11-27 DIAGNOSIS — Z87891 Personal history of nicotine dependence: Secondary | ICD-10-CM | POA: Diagnosis not present

## 2021-11-27 DIAGNOSIS — I951 Orthostatic hypotension: Secondary | ICD-10-CM | POA: Diagnosis not present

## 2021-11-27 DIAGNOSIS — K219 Gastro-esophageal reflux disease without esophagitis: Secondary | ICD-10-CM | POA: Diagnosis not present

## 2021-11-27 DIAGNOSIS — F5101 Primary insomnia: Secondary | ICD-10-CM | POA: Diagnosis not present

## 2021-11-27 DIAGNOSIS — G43909 Migraine, unspecified, not intractable, without status migrainosus: Secondary | ICD-10-CM | POA: Diagnosis not present

## 2021-11-27 DIAGNOSIS — D509 Iron deficiency anemia, unspecified: Secondary | ICD-10-CM | POA: Diagnosis not present

## 2021-11-27 DIAGNOSIS — I4581 Long QT syndrome: Secondary | ICD-10-CM | POA: Diagnosis not present

## 2021-11-27 DIAGNOSIS — I340 Nonrheumatic mitral (valve) insufficiency: Secondary | ICD-10-CM | POA: Diagnosis not present

## 2021-11-27 DIAGNOSIS — I5042 Chronic combined systolic (congestive) and diastolic (congestive) heart failure: Secondary | ICD-10-CM | POA: Diagnosis not present

## 2021-11-27 DIAGNOSIS — M1990 Unspecified osteoarthritis, unspecified site: Secondary | ICD-10-CM | POA: Diagnosis not present

## 2021-11-27 DIAGNOSIS — H9191 Unspecified hearing loss, right ear: Secondary | ICD-10-CM | POA: Diagnosis not present

## 2021-12-02 ENCOUNTER — Other Ambulatory Visit: Payer: Self-pay | Admitting: Primary Care

## 2021-12-02 DIAGNOSIS — J439 Emphysema, unspecified: Secondary | ICD-10-CM

## 2021-12-05 DIAGNOSIS — I5042 Chronic combined systolic (congestive) and diastolic (congestive) heart failure: Secondary | ICD-10-CM | POA: Diagnosis not present

## 2021-12-05 DIAGNOSIS — I340 Nonrheumatic mitral (valve) insufficiency: Secondary | ICD-10-CM | POA: Diagnosis not present

## 2021-12-05 DIAGNOSIS — I130 Hypertensive heart and chronic kidney disease with heart failure and stage 1 through stage 4 chronic kidney disease, or unspecified chronic kidney disease: Secondary | ICD-10-CM | POA: Diagnosis not present

## 2021-12-05 DIAGNOSIS — I951 Orthostatic hypotension: Secondary | ICD-10-CM | POA: Diagnosis not present

## 2021-12-05 DIAGNOSIS — I272 Pulmonary hypertension, unspecified: Secondary | ICD-10-CM | POA: Diagnosis not present

## 2021-12-05 DIAGNOSIS — N1831 Chronic kidney disease, stage 3a: Secondary | ICD-10-CM | POA: Diagnosis not present

## 2021-12-11 ENCOUNTER — Ambulatory Visit
Admission: RE | Admit: 2021-12-11 | Discharge: 2021-12-11 | Disposition: A | Discharge status: Home / Self Care | Payer: Medicare Other | Source: Ambulatory Visit | Attending: Neurology | Admitting: Neurology

## 2021-12-11 DIAGNOSIS — G35 Multiple sclerosis: Secondary | ICD-10-CM

## 2021-12-11 MED ORDER — GADOBENATE DIMEGLUMINE 529 MG/ML IV SOLN: 12.0000 mL | Freq: Once | INTRAVENOUS | Status: DC | PRN

## 2021-12-12 DIAGNOSIS — N1831 Chronic kidney disease, stage 3a: Secondary | ICD-10-CM | POA: Diagnosis not present

## 2021-12-12 DIAGNOSIS — I130 Hypertensive heart and chronic kidney disease with heart failure and stage 1 through stage 4 chronic kidney disease, or unspecified chronic kidney disease: Secondary | ICD-10-CM | POA: Diagnosis not present

## 2021-12-12 DIAGNOSIS — I951 Orthostatic hypotension: Secondary | ICD-10-CM | POA: Diagnosis not present

## 2021-12-12 DIAGNOSIS — I5042 Chronic combined systolic (congestive) and diastolic (congestive) heart failure: Secondary | ICD-10-CM | POA: Diagnosis not present

## 2021-12-12 DIAGNOSIS — I340 Nonrheumatic mitral (valve) insufficiency: Secondary | ICD-10-CM | POA: Diagnosis not present

## 2021-12-12 DIAGNOSIS — I272 Pulmonary hypertension, unspecified: Secondary | ICD-10-CM | POA: Diagnosis not present

## 2021-12-17 DIAGNOSIS — I272 Pulmonary hypertension, unspecified: Secondary | ICD-10-CM | POA: Diagnosis not present

## 2021-12-17 DIAGNOSIS — I5042 Chronic combined systolic (congestive) and diastolic (congestive) heart failure: Secondary | ICD-10-CM | POA: Diagnosis not present

## 2021-12-17 DIAGNOSIS — N1831 Chronic kidney disease, stage 3a: Secondary | ICD-10-CM | POA: Diagnosis not present

## 2021-12-17 DIAGNOSIS — I951 Orthostatic hypotension: Secondary | ICD-10-CM | POA: Diagnosis not present

## 2021-12-17 DIAGNOSIS — I130 Hypertensive heart and chronic kidney disease with heart failure and stage 1 through stage 4 chronic kidney disease, or unspecified chronic kidney disease: Secondary | ICD-10-CM | POA: Diagnosis not present

## 2021-12-17 DIAGNOSIS — I340 Nonrheumatic mitral (valve) insufficiency: Secondary | ICD-10-CM | POA: Diagnosis not present

## 2021-12-24 ENCOUNTER — Ambulatory Visit: Payer: Medicare Other | Admitting: Neurology

## 2021-12-24 DIAGNOSIS — I340 Nonrheumatic mitral (valve) insufficiency: Secondary | ICD-10-CM | POA: Diagnosis not present

## 2021-12-24 DIAGNOSIS — N1831 Chronic kidney disease, stage 3a: Secondary | ICD-10-CM | POA: Diagnosis not present

## 2021-12-24 DIAGNOSIS — I5042 Chronic combined systolic (congestive) and diastolic (congestive) heart failure: Secondary | ICD-10-CM | POA: Diagnosis not present

## 2021-12-24 DIAGNOSIS — I130 Hypertensive heart and chronic kidney disease with heart failure and stage 1 through stage 4 chronic kidney disease, or unspecified chronic kidney disease: Secondary | ICD-10-CM | POA: Diagnosis not present

## 2021-12-24 DIAGNOSIS — I272 Pulmonary hypertension, unspecified: Secondary | ICD-10-CM | POA: Diagnosis not present

## 2021-12-24 DIAGNOSIS — I951 Orthostatic hypotension: Secondary | ICD-10-CM | POA: Diagnosis not present

## 2021-12-26 ENCOUNTER — Other Ambulatory Visit: Payer: Medicare Other | Admitting: Nurse Practitioner

## 2021-12-26 ENCOUNTER — Encounter: Payer: Self-pay | Admitting: Nurse Practitioner

## 2021-12-26 ENCOUNTER — Other Ambulatory Visit: Payer: Self-pay

## 2021-12-26 DIAGNOSIS — R0602 Shortness of breath: Secondary | ICD-10-CM | POA: Diagnosis not present

## 2021-12-26 DIAGNOSIS — G4701 Insomnia due to medical condition: Secondary | ICD-10-CM

## 2021-12-26 DIAGNOSIS — I340 Nonrheumatic mitral (valve) insufficiency: Secondary | ICD-10-CM | POA: Diagnosis not present

## 2021-12-26 DIAGNOSIS — N1831 Chronic kidney disease, stage 3a: Secondary | ICD-10-CM | POA: Diagnosis not present

## 2021-12-26 DIAGNOSIS — I5042 Chronic combined systolic (congestive) and diastolic (congestive) heart failure: Secondary | ICD-10-CM | POA: Diagnosis not present

## 2021-12-26 DIAGNOSIS — I951 Orthostatic hypotension: Secondary | ICD-10-CM | POA: Diagnosis not present

## 2021-12-26 DIAGNOSIS — I130 Hypertensive heart and chronic kidney disease with heart failure and stage 1 through stage 4 chronic kidney disease, or unspecified chronic kidney disease: Secondary | ICD-10-CM | POA: Diagnosis not present

## 2021-12-26 DIAGNOSIS — I509 Heart failure, unspecified: Secondary | ICD-10-CM | POA: Diagnosis not present

## 2021-12-26 DIAGNOSIS — Z515 Encounter for palliative care: Secondary | ICD-10-CM | POA: Diagnosis not present

## 2021-12-26 DIAGNOSIS — I272 Pulmonary hypertension, unspecified: Secondary | ICD-10-CM | POA: Diagnosis not present

## 2021-12-26 NOTE — Progress Notes (Signed)
[facility] Consult Note Telephone: [phone]  Fax: [phone]    Date of encounter: 12/26/21 4:22 PM PATIENT NAME: Anita [name] [address] [place] [address]   [phone] (home)  DOB: Dec 05, 1961 MRN: [id number] PRIMARY CARE PROVIDER:    [name], NP,  [place] [address] [phone]  RESPONSIBLE PARTY:    Contact Information     Name Relation Home Work Mobile   [place] Daughter [phone]        Due to the COVID-19 crisis, this visit was done via telemedicine from my office and it was initiated and consent by this patient and or family.  I connected with  Felix Pacini OR PROXY on 12/26/21 by a phone as video not available enabled telemedicine application and verified that I am speaking with the correct person using two identifiers.   I discussed the limitations of evaluation and management by telemedicine. The patient expressed understanding and agreed to proceed.  Palliative Care was asked to follow this patient by consultation request of  [name], NP to address advance care planning and complex medical decision making. This is a follow up visit.                                  ASSESSMENT AND PLAN / RECOMMENDATIONS:  Symptom Management/Plan: 1. Advance Care Planning; Full code, will continue to discussions   2. Shortness of breath secondary severe mitral regurgitation, heart failure not surgical candidate; continue with cardiology visits, awaiting holter monitor results, it was completed, monitored for 2 weeks. Discussed importance of rest   3. Generalized weakness secondary to MS, continue fall precautions; use walker, discussed fall risk. Continue to encourage self independence as able   4. Insomnia, sleep patterns. Revisited stressed sleep hygiene, sleep aids. Ms. Briski endorses she has tried sleep aids without improvement. We  talked about alternative. Continue to encourage healthy sleeping habits.We talked about overall fatigue, malaise when one's body is working harder due to lack of sleep. Ms. Marquess endorses she understood.    5. Palliative care encounter; Palliative care encounter; Palliative medicine team will continue to support patient, patient's family, and medical team. Visit consisted of counseling and education dealing with the complex and emotionally intense issues of symptom management and palliative care in the setting of serious and potentially life-threatening illness  Follow up Palliative Care Visit: Palliative care will continue to follow for complex medical decision making, advance care planning, and clarification of goals. Return 8 weeks or prn.  I spent 47 minutes providing this consultation. More than 50% of the time in this consultation was spent in counseling and care coordination.  PPS: 40%  Chief Complaint: Follow up palliative consult for complex medical decision making  HISTORY OF PRESENT ILLNESS:  ZOEI [name] is a 61 y.o. year old female  with multiple medical problems including hx of MS, CAD, severe MR with moderate mitral stenosis, COPD, tobacco use, systolic and diastolic heart failure. I called Anita [name] daughter Anita [name] to confirm f/u telemedicine telephonic as video not available, Anita [name] and Anita [name] in agreement.  We talked about how she has been feeling. Ms. Brash endorses she has an coming cardiology with awaiting hearing about holter monitor results. Anita [name] endorses the monitor was on her for 2 weeks. We talked about ros, symptoms. We talked about progressive MS including more  difficulty with speech, slower and more difficulty processing thoughts. We talked about insomnia, sleep patterns.  We walked about sleep hygiene, sleep aids. Continue to encourage healthy sleeping habits, would benefit tachycardia. We talked about overall fatigue, malaise when  one's body is working harder due to lack of sleep. Anita [name] endorses she understood. We talked about appetite, which has been decreased. We talked about foods she likes. We talked about symptoms of shortness of breath with exertion, fatigue, weakness, unsteady gait. We talked about fall risk, using walker which Anita [name] endorses she uses. Anita [name] endorses she is no longer able to stay by herself. We talked about medical goals. We talked about role pc. We talked about f/u pc visit, Anita [name] in agreement, scheduled. We talked about progression of MS, expectations, impact on her adult children with chronic disease progression. We talked about functional and cognitive changes. We talked about coping strategies. Therapeutic listening, emotional support provided. We talked about quality of life. Questions answered.  Marland Kitchen   History obtained from review of EMR, discussion with  Anita [name].  I reviewed available labs, medications, imaging, studies and related documents from the EMR.  Records reviewed and summarized above.   ROS 10 point system reviewed with Anita [name] all negative except HPI  Physical Exam: deferred  Thank you for the opportunity to participate in the care of Anita [name].  The palliative care team will continue to follow. Please call our office at [phone] if we can be of additional assistance.   This chart was dictated using voice recognition software.  Despite best efforts to proofread,  errors can occur which can change the documentation meaning.   Questions and concerns were addressed. The patient/family was encouraged to call with questions and/or concerns. My contact information was provided. Provided general support and encouragement, no other unmet needs identified   Nicki Furlan Ihor Gully, NP

## 2021-12-27 DIAGNOSIS — Z8701 Personal history of pneumonia (recurrent): Secondary | ICD-10-CM | POA: Diagnosis not present

## 2021-12-27 DIAGNOSIS — D509 Iron deficiency anemia, unspecified: Secondary | ICD-10-CM | POA: Diagnosis not present

## 2021-12-27 DIAGNOSIS — I130 Hypertensive heart and chronic kidney disease with heart failure and stage 1 through stage 4 chronic kidney disease, or unspecified chronic kidney disease: Secondary | ICD-10-CM | POA: Diagnosis not present

## 2021-12-27 DIAGNOSIS — I272 Pulmonary hypertension, unspecified: Secondary | ICD-10-CM | POA: Diagnosis not present

## 2021-12-27 DIAGNOSIS — I2510 Atherosclerotic heart disease of native coronary artery without angina pectoris: Secondary | ICD-10-CM | POA: Diagnosis not present

## 2021-12-27 DIAGNOSIS — I951 Orthostatic hypotension: Secondary | ICD-10-CM | POA: Diagnosis not present

## 2021-12-27 DIAGNOSIS — F5101 Primary insomnia: Secondary | ICD-10-CM | POA: Diagnosis not present

## 2021-12-27 DIAGNOSIS — M1990 Unspecified osteoarthritis, unspecified site: Secondary | ICD-10-CM | POA: Diagnosis not present

## 2021-12-27 DIAGNOSIS — G35 Multiple sclerosis: Secondary | ICD-10-CM | POA: Diagnosis not present

## 2021-12-27 DIAGNOSIS — G43909 Migraine, unspecified, not intractable, without status migrainosus: Secondary | ICD-10-CM | POA: Diagnosis not present

## 2021-12-27 DIAGNOSIS — I4581 Long QT syndrome: Secondary | ICD-10-CM | POA: Diagnosis not present

## 2021-12-27 DIAGNOSIS — R1319 Other dysphagia: Secondary | ICD-10-CM | POA: Diagnosis not present

## 2021-12-27 DIAGNOSIS — E785 Hyperlipidemia, unspecified: Secondary | ICD-10-CM | POA: Diagnosis not present

## 2021-12-27 DIAGNOSIS — I5042 Chronic combined systolic (congestive) and diastolic (congestive) heart failure: Secondary | ICD-10-CM | POA: Diagnosis not present

## 2021-12-27 DIAGNOSIS — N1831 Chronic kidney disease, stage 3a: Secondary | ICD-10-CM | POA: Diagnosis not present

## 2021-12-27 DIAGNOSIS — G40909 Epilepsy, unspecified, not intractable, without status epilepticus: Secondary | ICD-10-CM | POA: Diagnosis not present

## 2021-12-27 DIAGNOSIS — Z87440 Personal history of urinary (tract) infections: Secondary | ICD-10-CM | POA: Diagnosis not present

## 2021-12-27 DIAGNOSIS — K219 Gastro-esophageal reflux disease without esophagitis: Secondary | ICD-10-CM | POA: Diagnosis not present

## 2021-12-27 DIAGNOSIS — I252 Old myocardial infarction: Secondary | ICD-10-CM | POA: Diagnosis not present

## 2021-12-27 DIAGNOSIS — I340 Nonrheumatic mitral (valve) insufficiency: Secondary | ICD-10-CM | POA: Diagnosis not present

## 2021-12-27 DIAGNOSIS — Z87891 Personal history of nicotine dependence: Secondary | ICD-10-CM | POA: Diagnosis not present

## 2021-12-27 DIAGNOSIS — H9191 Unspecified hearing loss, right ear: Secondary | ICD-10-CM | POA: Diagnosis not present

## 2021-12-27 DIAGNOSIS — J439 Emphysema, unspecified: Secondary | ICD-10-CM | POA: Diagnosis not present

## 2021-12-27 DIAGNOSIS — Z7982 Long term (current) use of aspirin: Secondary | ICD-10-CM | POA: Diagnosis not present

## 2021-12-27 DIAGNOSIS — I255 Ischemic cardiomyopathy: Secondary | ICD-10-CM | POA: Diagnosis not present

## 2021-12-30 ENCOUNTER — Telehealth: Payer: Self-pay | Admitting: Primary Care

## 2021-12-30 DIAGNOSIS — I340 Nonrheumatic mitral (valve) insufficiency: Secondary | ICD-10-CM | POA: Diagnosis not present

## 2021-12-30 DIAGNOSIS — I5042 Chronic combined systolic (congestive) and diastolic (congestive) heart failure: Secondary | ICD-10-CM | POA: Diagnosis not present

## 2021-12-30 DIAGNOSIS — N1831 Chronic kidney disease, stage 3a: Secondary | ICD-10-CM | POA: Diagnosis not present

## 2021-12-30 DIAGNOSIS — I951 Orthostatic hypotension: Secondary | ICD-10-CM | POA: Diagnosis not present

## 2021-12-30 DIAGNOSIS — I272 Pulmonary hypertension, unspecified: Secondary | ICD-10-CM | POA: Diagnosis not present

## 2021-12-30 DIAGNOSIS — I130 Hypertensive heart and chronic kidney disease with heart failure and stage 1 through stage 4 chronic kidney disease, or unspecified chronic kidney disease: Secondary | ICD-10-CM | POA: Diagnosis not present

## 2021-12-30 NOTE — Telephone Encounter (Signed)
[facility]  [phone]  Calling re: 3 orders:  Faxed 1.20.23  Order# [id number] 2 pages,  re: PT Discipline added  Faxed 1.19.23   Order# [id number] Re: OT modifications & OT POC frequency  Faxed 1.9.23   Order# [id number] Re: Skilled nursing  Have not received, calling checking on the status,

## 2021-12-30 NOTE — Telephone Encounter (Signed)
All orders I had last week were signed and placed in Joellen's inbox at the end of last week. She was out from Wed-Friday last week so she probably hasn't had a chance to fax anything.

## 2021-12-31 DIAGNOSIS — I340 Nonrheumatic mitral (valve) insufficiency: Secondary | ICD-10-CM | POA: Diagnosis not present

## 2021-12-31 DIAGNOSIS — I272 Pulmonary hypertension, unspecified: Secondary | ICD-10-CM | POA: Diagnosis not present

## 2021-12-31 DIAGNOSIS — I5042 Chronic combined systolic (congestive) and diastolic (congestive) heart failure: Secondary | ICD-10-CM | POA: Diagnosis not present

## 2021-12-31 DIAGNOSIS — N1831 Chronic kidney disease, stage 3a: Secondary | ICD-10-CM | POA: Diagnosis not present

## 2021-12-31 DIAGNOSIS — I130 Hypertensive heart and chronic kidney disease with heart failure and stage 1 through stage 4 chronic kidney disease, or unspecified chronic kidney disease: Secondary | ICD-10-CM | POA: Diagnosis not present

## 2021-12-31 DIAGNOSIS — I951 Orthostatic hypotension: Secondary | ICD-10-CM | POA: Diagnosis not present

## 2021-12-31 NOTE — Telephone Encounter (Signed)
Looked through my orders do not have any listed. Called spoke to crystal she will refax

## 2022-01-01 ENCOUNTER — Ambulatory Visit (INDEPENDENT_AMBULATORY_CARE_PROVIDER_SITE_OTHER): Payer: Medicare Other | Admitting: Cardiology

## 2022-01-01 ENCOUNTER — Telehealth: Payer: Self-pay | Admitting: Cardiology

## 2022-01-01 ENCOUNTER — Encounter (HOSPITAL_BASED_OUTPATIENT_CLINIC_OR_DEPARTMENT_OTHER): Payer: Self-pay | Admitting: Cardiology

## 2022-01-01 VITALS — BP 157/95 | HR 90 | Ht 63.0 in | Wt 138.6 lb

## 2022-01-01 DIAGNOSIS — I471 Supraventricular tachycardia: Secondary | ICD-10-CM

## 2022-01-01 DIAGNOSIS — I340 Nonrheumatic mitral (valve) insufficiency: Secondary | ICD-10-CM | POA: Diagnosis not present

## 2022-01-01 DIAGNOSIS — I5042 Chronic combined systolic (congestive) and diastolic (congestive) heart failure: Secondary | ICD-10-CM

## 2022-01-01 DIAGNOSIS — I2510 Atherosclerotic heart disease of native coronary artery without angina pectoris: Secondary | ICD-10-CM | POA: Diagnosis not present

## 2022-01-01 DIAGNOSIS — I4729 Other ventricular tachycardia: Secondary | ICD-10-CM | POA: Diagnosis not present

## 2022-01-01 DIAGNOSIS — I255 Ischemic cardiomyopathy: Secondary | ICD-10-CM

## 2022-01-01 MED ORDER — METOPROLOL SUCCINATE ER 25 MG PO TB24: 25.0000 mg | ORAL_TABLET | Freq: Every day | ORAL | 3 refills | Status: DC

## 2022-01-01 MED ORDER — TORSEMIDE 20 MG PO TABS: ORAL_TABLET | ORAL | 11 refills | Status: DC

## 2022-01-01 NOTE — Patient Instructions (Signed)
Medication Instructions:  START: Metoprolol Succinate 25 mg daily  *If you need a refill on your cardiac medications before your next appointment, please call your pharmacy*   Lab Work: None ordered today   Testing/Procedures: None ordered today   Follow-Up: At [facility], you and your health needs are our priority.  As part of our continuing mission to provide you with exceptional heart care, we have created designated Provider Care Teams.  These Care Teams include your primary Cardiologist (physician) and Advanced Practice Providers (APPs -  Physician Assistants and Nurse Practitioners) who all work together to provide you with the care you need, when you need it.  We recommend signing up for the patient portal called "MyChart".  Sign up information is provided on this After Visit Summary.  MyChart is used to connect with patients for Virtual Visits (Telemedicine).  Patients are able to view lab/test results, encounter notes, upcoming appointments, etc.  Non-urgent messages can be sent to your provider as well.   To learn more about what you can do with MyChart, go to [email or web address].    Your next appointment:   3 month(s)  The format for your next appointment:   In Person  Provider:   Buford Dresser, MD

## 2022-01-01 NOTE — Telephone Encounter (Signed)
Per Dr. Harrell Gave, ok to change appointment to virtual. Daughter made aware

## 2022-01-01 NOTE — Telephone Encounter (Signed)
Patient has appt today at 3pm, daughter would like to know if that appt can be switched to a telephone visit. As she is not feeling well, and can't bring her mother in.

## 2022-01-01 NOTE — Progress Notes (Signed)
Virtual Visit via Telephone Note   This visit type was conducted due to national recommendations for restrictions regarding the COVID-19 Pandemic (e.g. social distancing) in an effort to limit this patient's exposure and mitigate transmission in our community.  Due to her co-morbid illnesses, this patient is at least at moderate risk for complications without adequate follow up.  This format is felt to be most appropriate for this patient at this time.  The patient did not have access to video technology/had technical difficulties with video requiring transitioning to audio format only (telephone).  All issues noted in this document were discussed and addressed.  No physical exam could be performed with this format.  Please refer to the patient's chart for her  consent to telehealth for [facility].   The patient was identified using 2 identifiers.  Date:  01/01/2022   ID:  Anita [name], DOB 04-18-1961, MRN [id number]  Patient Location: Home Provider Location: Office/Clinic  PCP:  [name], NP  Cardiologist:  Buford Dresser, MD  Electrophysiologist:  None   Evaluation Performed:  Follow-Up Visit  Chief Complaint:  follow up  History of Present Illness:    Anita [name] is a 61 y.o. female with history of multiple sclerosis, CAD, severe mitral regurgitation with moderate mitral stenosis, likely COPD, prior tobacco use. I met her in the hospital 03/2020, see workup below.  Today: Daughter and patient present by phone.  Reviewed monitor results. Frequent paroxysmal SVT, one episode lasting 53 minutes. Rare NSVT. Discussed treatment options.  Wrist cuff at home has been high; lower when PT checks. Very deconditioned; minimally active except to walk to the bathroom.  No extremity swelling, but has intermittent abdominal swelling. Taking torsemide about once/week.  The patient does not have symptoms concerning for COVID-19 infection (fever, chills,  cough, or new shortness of breath).   Past Medical History:  Diagnosis Date   Abnormality of gait 07/26/2013   Acute respiratory failure with hypoxia ([place]) 03/20/2020   Allergy    Arthritis    Atypical pneumonia 03/22/2020   CAD (coronary artery disease)    a. 03/2020 Cath: LM nl, LAD 60/34m 55d, D1 70, D2 70, LCX 45ost/p, 65p, RCA 100ost CTO. RPDA fills via collats from dLAD. Inf septal fills via collats from 1st septal, RPAV fills via collats from LPAV, RPL1/2 sev dzs-->med rx.   Chickenpox    Chronic combined systolic (congestive) and diastolic (congestive) heart failure ([place]    a. 02/2021 Echo: EF 40-45%, glob HK, gr2 DD. Sev red RV fxn, RVSP 69.240mg. Mod dil LA, sev dil RA. Sev MR. Mild to mod MS. Mean MV grad 6.85m32m. Mod-Sev TR. Mild AI. Mild Ao sclerosis w/o stenosis.   COPD (chronic obstructive pulmonary disease) (HCC)    Elevated troponin 03/22/2020   GERD (gastroesophageal reflux disease)    Headache(784.0)    Migraine   Hearing loss    Right ear secondary to infection   History of shingles    Ischemic cardiomyopathy    a. 02/2021 Echo: EF 40-45%, glob HK.   Migraines    Multiple sclerosis (HCC)    Obese    Optic neuritis    [place]H [place] artery hypertension) [place]  a. 02/2021 Echo: RVSP 69.2mm58m   Prolonged QT interval 03/20/2020   Severe mitral regurgitation    a. 02/2021 Echo: Severe MR w/ mild to mod MS.  Mean MV grad 6.85mmH73m   Past Surgical History:  Procedure Laterality Date   COLONOSCOPY  WITH PROPOFOL N/A 08/03/2020   Procedure: COLONOSCOPY WITH PROPOFOL;  Surgeon: [name], MD;  Location: [facility] ENDOSCOPY;  Service: Gastroenterology;  Laterality: N/A;   Ganglioneuroma     Resection   LEFT HEART CATH AND CORONARY ANGIOGRAPHY N/A 03/23/2020   Procedure: LEFT HEART CATH AND CORONARY ANGIOGRAPHY;  Surgeon: Leonie Man, MD;  Location: [facility] CV LAB;  Service: Cardiovascular;  Laterality: N/A;   PILONIDAL CYST EXCISION     PILONIDAL CYST EXCISION   1983   TUMOR REMOVAL  2007/2008    Current Medications: Current Outpatient Medications on File Prior to Visit  Medication Sig   albuterol (VENTOLIN HFA) 108 (90 Base) MCG/ACT inhaler INHALE 1-2 PUFFS INTO THE LUNGS EVERY 6 (SIX) HOURS AS NEEDED FOR WHEEZING OR SHORTNESS OF BREATH. USE SPARINGLY!   calcium carbonate (TUMS EX) 750 MG chewable tablet Chew 1 tablet by mouth daily as needed for heartburn.   cetirizine (ZYRTEC) 10 MG tablet Take 10 mg by mouth daily.   diclofenac Sodium (VOLTAREN) 1 % GEL Apply 2 g topically 4 (four) times daily as needed (back or joint pain).   Doxylamine Succinate, Sleep, (SLEEP AID PO) Take 1 tablet by mouth at bedtime as needed (sleep).   feeding supplement, ENSURE ENLIVE, (ENSURE ENLIVE) LIQD Take 237 mLs by mouth 2 (two) times daily between meals.   ferrous sulfate 325 (65 FE) MG EC tablet TAKE 1 TABLET BY MOUTH 2 TIMES DAILY WITH A MEAL.   fluticasone-salmeterol (ADVAIR DISKUS) 250-50 MCG/ACT AEPB INHALE 1 PUFF INTO THE LUNGS TWICE A DAY   guaiFENesin (MUCINEX) 600 MG 12 hr tablet Take 600 mg by mouth 2 (two) times daily as needed for cough.   Multiple Vitamin (MULTIVITAMIN WITH MINERALS) TABS tablet Take 1 tablet by mouth daily.   nitroGLYCERIN (NITROSTAT) 0.4 MG SL tablet Place 1 tablet (0.4 mg total) under the tongue every 5 (five) minutes as needed for chest pain.   omeprazole (PRILOSEC) 40 MG capsule Take 1 capsule (40 mg total) by mouth daily.   Probiotic Product (PROBIOTIC ADVANCED) CAPS Take 1 capsule by mouth daily.   vitamin B-12 (CYANOCOBALAMIN) 1000 MCG tablet Take 1,000 mcg by mouth daily.   No current facility-administered medications on file prior to visit.     Allergies:   Acthar hp [corticotropin], Codeine, and Prednisone   Social History   Tobacco Use   Smoking status: Former    Packs/day: 1.00    Types: Cigarettes   Smokeless tobacco: Never  Vaping Use   Vaping Use: Never used  Substance Use Topics   Alcohol use: Yes     Comment: Occasional    Family History: family history includes Bipolar disorder in her sister; Heart disease in her brother; Lung cancer in her father; Skin cancer in her mother; Throat cancer in her paternal grandmother; Uterine cancer in her sister.  ROS:   Please see the history of present illness.   Additional pertinent ROS otherwise unremarkable.    EKGs/Labs/Other Studies Reviewed:    The following studies were reviewed today:  Monitor 11/2021: Patch Wear Time:  6 days and 18 hours   Patient had a min HR of 66 bpm, max HR of 207 bpm, and avg HR of 94 bpm. Predominant underlying rhythm was Sinus Rhythm. 2 Ventricular Tachycardia runs occurred, the run with the fastest interval lasting 11 beats with a max rate of 182 bpm (avg 175 bpm);  the run with the fastest interval was also the longest. 20 Supraventricular Tachycardia runs  occurred, the run with the fastest interval lasting 53 mins 7 secs with a max rate of 207 bpm (avg 174 bpm); the run with the fastest interval was also the longest.  No atrial fibrillation, high degree block, or pauses noted. Isolated atrial and ventricular ectopy was rare to occasional (~1%). There were 5 triggered events. These events were sinus with SVT, PACs, and PVCs.  Left UE Venous Doppler 05/18/2021: COMPARISON:  None.   FINDINGS: Contralateral Subclavian Vein: Respiratory phasicity is normal and symmetric with the symptomatic side. No evidence of thrombus. Normal compressibility.   Internal Jugular Vein: No evidence of thrombus. Normal compressibility, respiratory phasicity and response to augmentation.   Subclavian Vein: No evidence of thrombus. Normal compressibility, respiratory phasicity and response to augmentation.   Axillary Vein: No evidence of thrombus. Normal compressibility, respiratory phasicity and response to augmentation.   Cephalic Vein: No evidence of thrombus. Normal compressibility, respiratory phasicity and response to  augmentation.   Basilic Vein: No evidence of thrombus. Normal compressibility, respiratory phasicity and response to augmentation.   Brachial Veins: No evidence of thrombus. Normal compressibility, respiratory phasicity and response to augmentation.   Radial Veins: No evidence of thrombus. Normal compressibility, respiratory phasicity and response to augmentation.   Ulnar Veins: No evidence of thrombus. Normal compressibility, respiratory phasicity and response to augmentation.   Venous Reflux:  None visualized.   Other Findings:  None visualized.   IMPRESSION: No evidence of DVT within the left upper extremity.   Diffuse subcutaneous edema.  Echo 3.3.22 1. Left ventricular ejection fraction, by estimation, is 40 to 45%. Left  ventricular ejection fraction by 3D volume is 47 %. The left ventricle has  mildly decreased function. The left ventricle demonstrates global  hypokinesis. Left ventricular diastolic   parameters are consistent with Grade II diastolic dysfunction  (pseudonormalization). Elevated left ventricular end-diastolic pressure.   2. Right ventricular systolic function is severely reduced. The right  ventricular size is mildly enlarged. There is severely elevated pulmonary  artery systolic pressure. The estimated right ventricular systolic  pressure is 14.9 mmHg.   3. Left atrial size was moderately dilated.   4. Right atrial size was severely dilated.   5. The mitral valve is degenerative with mild to moderate thickening of  the mitral valve leaflets. There is teathering of the MV leaflets due to  mild LV dysfunction. Severe mitral valve regurgitation. Mild to moderate  mitral stenosis. The mean mitral  valve gradient is 6.5 mmHg.   6. Tricuspid valve regurgitation is moderate to severe.   7. The aortic valve is tricuspid. Aortic valve regurgitation is mild.  Mild aortic valve sclerosis is present, with no evidence of aortic valve  stenosis. Aortic  regurgitation PHT measures 499 msec.   8. The inferior vena cava is dilated in size with <50% respiratory  variability, suggesting right atrial pressure of 15 mmHg.   9. There is a trivial pericardial effusion posterior to the left  ventricle.  10. Compared to prior echo, Mitral regurgitation appears worse and RV  dysfunction is now present. There is severe Pulmonary HTN.  Echo 03/20/20  1. Left ventricular ejection fraction, by estimation, is 40 to 45%. The  left ventricle has mildly decreased function. The left ventricle  demonstrates global hypokinesis. The left ventricular internal cavity size  was mildly dilated. Left ventricular  diastolic function could not be evaluated.   2. Right ventricular systolic function is normal. The right ventricular  size is normal. There is moderately elevated pulmonary artery  systolic  pressure.   3. Left atrial size was mildly dilated.   4. Moderate mitral subvalvular thickening/fibrosis.   5. The mitral valve is rheumatic. Moderate to severe mitral valve  regurgitation. Mild mitral stenosis. The mean mitral valve gradient is 7.3  mmHg with average heart rate of 105 bpm.   6. The aortic valve is normal in structure. Aortic valve regurgitation is  not visualized.   7. The inferior vena cava is dilated in size with <50% respiratory  variability, suggesting right atrial pressure of 15 mmHg.  CTA Chest 03/20/2020: COMPARISON:  03/19/2006 chest CT   FINDINGS: Cardiovascular: Normal heart size. No pericardial effusion. Aortic atherosclerosis. No pulmonary artery filling defect.   Mediastinum/Nodes: Mild prominence of hilar lymph nodes, likely reactive/congestive.   Lungs/Pleura: Patchy ground-glass opacity in the upper more than lower lungs. Small right and trace left pleural effusion with interlobular septal thickening at the bases.   Upper Abdomen: Left-sided retroperitoneal mass measuring 5.6 cm. Along the inferior aspect of the mass or  surgical clips and the mass is smaller than on 2007 abdominal CT.   Musculoskeletal: No acute finding   Review of the MIP images confirms the above findings.   IMPRESSION: 1. Ground-glass opacities with history favoring atypical/viral pneumonia. 2. Small pleural effusions and interlobular septal thickening not typically associated with viral pneumonia, findings could reflect atypical edema or failure superimposed on infection. 3. 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 There is residual or recurrent mass adjacent to the retroperitoneal clips, 5.6 cm. Was the patient is prior resection subtotal?   Cath 03/23/20 Hemodynamics: LV end diastolic pressure is moderately elevated. ----Coronary Angiography----- Ost RCA to Dist RCA lesion is 100% stenosed.-The PDA and PL system fills via faint collaterals from the AV groove LCx and LAD septals. Mid LAD-1 lesion is 60% stenosed. Mid LAD-2 lesion is 50% stenosed. Dist LAD lesion is 55% stenosed with 70% stenosed side branch in 2nd Diag. 1st Diag lesion is 70% stenosed. Ost Cx to Prox Cx lesion is 45% stenosed. Prox-MID Cx lesion is 65% stenosed.   MODERATE-SEVERE THREE-VESSEL CAD: 100% proximal RCA occlusion with left-to-right collaterals faintly filling PDA and PL system (AV groove LCx-PL and LAD septal-PDA) Diffuse moderate mid LAD disease with 60% to 50% stenosis. Tandem 50% and 65% proximal and mid LCx with the 65% lesion being the most significant lesion. Moderately elevated LVEDP of 18-20 mmHg   Given the extent of disease in the LAD and LCx, neither lesion is very amenable to PCI, and RCA is chronically occluded.  Given her comorbidities, I do not think she would be a good CABG candidate especially in light of the fact that she would likely require valve surgery as well.  She would not recover well.   Recommendations aggressive medical management.  EKG:  EKG is personally reviewed.   01/01/2022: not ordered  today 05/18/21: sinus tachycardia with pulmonary disease pattern  Recent Labs: 06/04/2021: B Natriuretic Peptide 930.0 06/11/2021: Magnesium 1.7 06/14/2021: BUN 17; Creat 1.13; Potassium 4.6; Sodium 139 06/28/2021: ALT 14 10/07/2021: Hemoglobin 12.1; Platelets 174   Recent Lipid Panel    Component Value Date/Time   CHOL 104 01/08/2021 1016   TRIG 66.0 01/08/2021 1016   HDL 40.20 01/08/2021 1016   CHOLHDL 3 01/08/2021 1016   VLDL 13.2 01/08/2021 1016   LDLCALC 51 01/08/2021 1016    Physical Exam:    VS:  BP (!) 157/95    Pulse 90    Ht '5\' 3"'  (1.6 m)  Wt 138 lb 9.6 oz (62.9 kg)    SpO2 98%    BMI 24.55 kg/m     Wt Readings from Last 3 Encounters:  01/01/22 138 lb 9.6 oz (62.9 kg)  10/23/21 134 lb (60.8 kg)  10/22/21 134 lb (60.8 kg)    Speaking comfortably on the phone, no audible wheezing In no acute distress Alert and oriented Normal affect Normal speech   ASSESSMENT:    1. Chronic combined systolic and diastolic heart failure ([place])   2. Ischemic cardiomyopathy   3. Coronary artery disease involving native coronary artery of native heart without angina pectoris   4. Mitral valve insufficiency, unspecified etiology   5. PSVT (paroxysmal supraventricular tachycardia) ([place])   6. NSVT (nonsustained ventricular tachycardia)     PLAN:    Cardiomyopathy, ischemic Chronic systolic and diastolic heart failure CAD with CTO RCA -no longer on aspirin, statin, or entresto. Had anemia, elevated LFTs, and hypotension.  -Required midodrine until recently. Home cuff reads higher than physical therapy measurement, but no lows -discussed metoprolol, see below. Will start metoprolol succinate 25 mg daily today -continue torsemide as needed  Tachycardia, intermittent Found to have frequent paroxysmal SVT and rare NSVT on monitor -starting metoprolol succinate as above  Shortness of breath, chronic Severe mitral regurgitation with rheumatic appearing mitral valve, with moderate  mitral stenosis -thickened leaflets with rheumatic movement -is not interested in surgery and would not likely be a candidate. With mitral stenosis, not a candidate for mitral clip  See prior discussions re: goals of care given multiple complex medical comorbidities  Time:   Today, I have spent 16 minutes with the patient with telehealth technology discussing the above problems.    Plan for follow up: 3 mos or sooner as needed  Buford Dresser, MD, PhD, [facility]   Medication Adjustments/Labs and Tests Ordered: Current medicines are reviewed at length with the patient today.  Concerns regarding medicines are outlined above.   No orders of the defined types were placed in this encounter.  Meds ordered this encounter  Medications   torsemide (DEMADEX) 20 MG tablet    Sig: Take for weight gain of 2 lbs in 1 day or 5 lbs in 1 week or obvious swelling.    Dispense:  30 tablet    Refill:  11   metoprolol succinate (TOPROL-XL) 25 MG 24 hr tablet    Sig: Take 1 tablet (25 mg total) by mouth daily. Take with or immediately following a meal.    Dispense:  90 tablet    Refill:  3   Patient Instructions  Medication Instructions:  START: Metoprolol Succinate 25 mg daily  *If you need a refill on your cardiac medications before your next appointment, please call your pharmacy*   Lab Work: None ordered today   Testing/Procedures: None ordered today   Follow-Up: At [facility], you and your health needs are our priority.  As part of our continuing mission to provide you with exceptional heart care, we have created designated Provider Care Teams.  These Care Teams include your primary Cardiologist (physician) and Advanced Practice Providers (APPs -  Physician Assistants and Nurse Practitioners) who all work together to provide you with the care you need, when you need it.  We recommend signing up for the patient portal called "MyChart".  Sign up  information is provided on this After Visit Summary.  MyChart is used to connect with patients for Virtual Visits (Telemedicine).  Patients are  able to view lab/test results, encounter notes, upcoming appointments, etc.  Non-urgent messages can be sent to your provider as well.   To learn more about what you can do with MyChart, go to [email or web address].    Your next appointment:   3 month(s)  The format for your next appointment:   In Person  Provider:   Buford Dresser, MD       Signed, Buford Dresser, MD PhD 01/01/2022    [place]

## 2022-01-02 DIAGNOSIS — I130 Hypertensive heart and chronic kidney disease with heart failure and stage 1 through stage 4 chronic kidney disease, or unspecified chronic kidney disease: Secondary | ICD-10-CM | POA: Diagnosis not present

## 2022-01-02 DIAGNOSIS — I340 Nonrheumatic mitral (valve) insufficiency: Secondary | ICD-10-CM | POA: Diagnosis not present

## 2022-01-02 DIAGNOSIS — I272 Pulmonary hypertension, unspecified: Secondary | ICD-10-CM | POA: Diagnosis not present

## 2022-01-02 DIAGNOSIS — I951 Orthostatic hypotension: Secondary | ICD-10-CM | POA: Diagnosis not present

## 2022-01-02 DIAGNOSIS — N1831 Chronic kidney disease, stage 3a: Secondary | ICD-10-CM | POA: Diagnosis not present

## 2022-01-02 DIAGNOSIS — I5042 Chronic combined systolic (congestive) and diastolic (congestive) heart failure: Secondary | ICD-10-CM | POA: Diagnosis not present

## 2022-01-02 NOTE — Telephone Encounter (Signed)
Have received an faxed back

## 2022-01-06 DIAGNOSIS — I5042 Chronic combined systolic (congestive) and diastolic (congestive) heart failure: Secondary | ICD-10-CM | POA: Diagnosis not present

## 2022-01-06 DIAGNOSIS — I272 Pulmonary hypertension, unspecified: Secondary | ICD-10-CM | POA: Diagnosis not present

## 2022-01-06 DIAGNOSIS — N1831 Chronic kidney disease, stage 3a: Secondary | ICD-10-CM | POA: Diagnosis not present

## 2022-01-06 DIAGNOSIS — I951 Orthostatic hypotension: Secondary | ICD-10-CM | POA: Diagnosis not present

## 2022-01-06 DIAGNOSIS — I130 Hypertensive heart and chronic kidney disease with heart failure and stage 1 through stage 4 chronic kidney disease, or unspecified chronic kidney disease: Secondary | ICD-10-CM | POA: Diagnosis not present

## 2022-01-06 DIAGNOSIS — I340 Nonrheumatic mitral (valve) insufficiency: Secondary | ICD-10-CM | POA: Diagnosis not present

## 2022-01-08 DIAGNOSIS — I130 Hypertensive heart and chronic kidney disease with heart failure and stage 1 through stage 4 chronic kidney disease, or unspecified chronic kidney disease: Secondary | ICD-10-CM | POA: Diagnosis not present

## 2022-01-08 DIAGNOSIS — I5042 Chronic combined systolic (congestive) and diastolic (congestive) heart failure: Secondary | ICD-10-CM | POA: Diagnosis not present

## 2022-01-08 DIAGNOSIS — I340 Nonrheumatic mitral (valve) insufficiency: Secondary | ICD-10-CM | POA: Diagnosis not present

## 2022-01-08 DIAGNOSIS — N1831 Chronic kidney disease, stage 3a: Secondary | ICD-10-CM | POA: Diagnosis not present

## 2022-01-08 DIAGNOSIS — I272 Pulmonary hypertension, unspecified: Secondary | ICD-10-CM | POA: Diagnosis not present

## 2022-01-08 DIAGNOSIS — I951 Orthostatic hypotension: Secondary | ICD-10-CM | POA: Diagnosis not present

## 2022-01-10 DIAGNOSIS — I5042 Chronic combined systolic (congestive) and diastolic (congestive) heart failure: Secondary | ICD-10-CM | POA: Diagnosis not present

## 2022-01-10 DIAGNOSIS — I130 Hypertensive heart and chronic kidney disease with heart failure and stage 1 through stage 4 chronic kidney disease, or unspecified chronic kidney disease: Secondary | ICD-10-CM | POA: Diagnosis not present

## 2022-01-10 DIAGNOSIS — I272 Pulmonary hypertension, unspecified: Secondary | ICD-10-CM | POA: Diagnosis not present

## 2022-01-10 DIAGNOSIS — I951 Orthostatic hypotension: Secondary | ICD-10-CM | POA: Diagnosis not present

## 2022-01-10 DIAGNOSIS — N1831 Chronic kidney disease, stage 3a: Secondary | ICD-10-CM | POA: Diagnosis not present

## 2022-01-10 DIAGNOSIS — I340 Nonrheumatic mitral (valve) insufficiency: Secondary | ICD-10-CM | POA: Diagnosis not present

## 2022-01-12 DIAGNOSIS — I951 Orthostatic hypotension: Secondary | ICD-10-CM | POA: Diagnosis not present

## 2022-01-12 DIAGNOSIS — I5042 Chronic combined systolic (congestive) and diastolic (congestive) heart failure: Secondary | ICD-10-CM | POA: Diagnosis not present

## 2022-01-12 DIAGNOSIS — I340 Nonrheumatic mitral (valve) insufficiency: Secondary | ICD-10-CM | POA: Diagnosis not present

## 2022-01-12 DIAGNOSIS — I130 Hypertensive heart and chronic kidney disease with heart failure and stage 1 through stage 4 chronic kidney disease, or unspecified chronic kidney disease: Secondary | ICD-10-CM | POA: Diagnosis not present

## 2022-01-12 DIAGNOSIS — N1831 Chronic kidney disease, stage 3a: Secondary | ICD-10-CM | POA: Diagnosis not present

## 2022-01-12 DIAGNOSIS — I272 Pulmonary hypertension, unspecified: Secondary | ICD-10-CM | POA: Diagnosis not present

## 2022-01-23 DIAGNOSIS — I5042 Chronic combined systolic (congestive) and diastolic (congestive) heart failure: Secondary | ICD-10-CM | POA: Diagnosis not present

## 2022-01-23 DIAGNOSIS — I951 Orthostatic hypotension: Secondary | ICD-10-CM | POA: Diagnosis not present

## 2022-01-23 DIAGNOSIS — I272 Pulmonary hypertension, unspecified: Secondary | ICD-10-CM | POA: Diagnosis not present

## 2022-01-23 DIAGNOSIS — N1831 Chronic kidney disease, stage 3a: Secondary | ICD-10-CM | POA: Diagnosis not present

## 2022-01-23 DIAGNOSIS — I130 Hypertensive heart and chronic kidney disease with heart failure and stage 1 through stage 4 chronic kidney disease, or unspecified chronic kidney disease: Secondary | ICD-10-CM | POA: Diagnosis not present

## 2022-01-23 DIAGNOSIS — I340 Nonrheumatic mitral (valve) insufficiency: Secondary | ICD-10-CM | POA: Diagnosis not present

## 2022-01-26 DIAGNOSIS — I5042 Chronic combined systolic (congestive) and diastolic (congestive) heart failure: Secondary | ICD-10-CM | POA: Diagnosis not present

## 2022-01-26 DIAGNOSIS — I252 Old myocardial infarction: Secondary | ICD-10-CM | POA: Diagnosis not present

## 2022-01-26 DIAGNOSIS — F5101 Primary insomnia: Secondary | ICD-10-CM | POA: Diagnosis not present

## 2022-01-26 DIAGNOSIS — G40909 Epilepsy, unspecified, not intractable, without status epilepticus: Secondary | ICD-10-CM | POA: Diagnosis not present

## 2022-01-26 DIAGNOSIS — N1831 Chronic kidney disease, stage 3a: Secondary | ICD-10-CM | POA: Diagnosis not present

## 2022-01-26 DIAGNOSIS — I951 Orthostatic hypotension: Secondary | ICD-10-CM | POA: Diagnosis not present

## 2022-01-26 DIAGNOSIS — Z87891 Personal history of nicotine dependence: Secondary | ICD-10-CM | POA: Diagnosis not present

## 2022-01-26 DIAGNOSIS — Z87440 Personal history of urinary (tract) infections: Secondary | ICD-10-CM | POA: Diagnosis not present

## 2022-01-26 DIAGNOSIS — I255 Ischemic cardiomyopathy: Secondary | ICD-10-CM | POA: Diagnosis not present

## 2022-01-26 DIAGNOSIS — J439 Emphysema, unspecified: Secondary | ICD-10-CM | POA: Diagnosis not present

## 2022-01-26 DIAGNOSIS — M1990 Unspecified osteoarthritis, unspecified site: Secondary | ICD-10-CM | POA: Diagnosis not present

## 2022-01-26 DIAGNOSIS — I340 Nonrheumatic mitral (valve) insufficiency: Secondary | ICD-10-CM | POA: Diagnosis not present

## 2022-01-26 DIAGNOSIS — D509 Iron deficiency anemia, unspecified: Secondary | ICD-10-CM | POA: Diagnosis not present

## 2022-01-26 DIAGNOSIS — I272 Pulmonary hypertension, unspecified: Secondary | ICD-10-CM | POA: Diagnosis not present

## 2022-01-26 DIAGNOSIS — G4700 Insomnia, unspecified: Secondary | ICD-10-CM | POA: Diagnosis not present

## 2022-01-26 DIAGNOSIS — I130 Hypertensive heart and chronic kidney disease with heart failure and stage 1 through stage 4 chronic kidney disease, or unspecified chronic kidney disease: Secondary | ICD-10-CM | POA: Diagnosis not present

## 2022-01-26 DIAGNOSIS — K219 Gastro-esophageal reflux disease without esophagitis: Secondary | ICD-10-CM | POA: Diagnosis not present

## 2022-01-26 DIAGNOSIS — G43909 Migraine, unspecified, not intractable, without status migrainosus: Secondary | ICD-10-CM | POA: Diagnosis not present

## 2022-01-26 DIAGNOSIS — H9191 Unspecified hearing loss, right ear: Secondary | ICD-10-CM | POA: Diagnosis not present

## 2022-01-26 DIAGNOSIS — I4581 Long QT syndrome: Secondary | ICD-10-CM | POA: Diagnosis not present

## 2022-01-26 DIAGNOSIS — Z8701 Personal history of pneumonia (recurrent): Secondary | ICD-10-CM | POA: Diagnosis not present

## 2022-01-26 DIAGNOSIS — E785 Hyperlipidemia, unspecified: Secondary | ICD-10-CM | POA: Diagnosis not present

## 2022-01-26 DIAGNOSIS — R1319 Other dysphagia: Secondary | ICD-10-CM | POA: Diagnosis not present

## 2022-01-26 DIAGNOSIS — I2510 Atherosclerotic heart disease of native coronary artery without angina pectoris: Secondary | ICD-10-CM | POA: Diagnosis not present

## 2022-01-26 DIAGNOSIS — G35 Multiple sclerosis: Secondary | ICD-10-CM | POA: Diagnosis not present

## 2022-01-31 ENCOUNTER — Telehealth: Payer: Self-pay | Admitting: Primary Care

## 2022-01-31 NOTE — Telephone Encounter (Signed)
Anita [name] called in and stated that Anita [name] missed her pt apt due to doctors appt

## 2022-02-04 NOTE — Telephone Encounter (Signed)
Noted. Is this home health? I don't see where she had an appointment with me. Patient will be due for follow up with me in April, please have her scheduled for the end of a session if possible.

## 2022-02-06 ENCOUNTER — Encounter: Payer: Self-pay | Admitting: Nurse Practitioner

## 2022-02-06 ENCOUNTER — Other Ambulatory Visit: Payer: Medicare Other | Admitting: Nurse Practitioner

## 2022-02-06 ENCOUNTER — Other Ambulatory Visit: Payer: Self-pay

## 2022-02-06 DIAGNOSIS — R531 Weakness: Secondary | ICD-10-CM | POA: Diagnosis not present

## 2022-02-06 DIAGNOSIS — G35 Multiple sclerosis: Secondary | ICD-10-CM | POA: Diagnosis not present

## 2022-02-06 DIAGNOSIS — I509 Heart failure, unspecified: Secondary | ICD-10-CM | POA: Diagnosis not present

## 2022-02-06 DIAGNOSIS — Z515 Encounter for palliative care: Secondary | ICD-10-CM | POA: Diagnosis not present

## 2022-02-06 DIAGNOSIS — Z7409 Other reduced mobility: Secondary | ICD-10-CM

## 2022-02-06 DIAGNOSIS — G4701 Insomnia due to medical condition: Secondary | ICD-10-CM

## 2022-02-06 DIAGNOSIS — R0602 Shortness of breath: Secondary | ICD-10-CM | POA: Diagnosis not present

## 2022-02-06 NOTE — Progress Notes (Signed)
[facility] Palliative Care Consult Note Telephone: [phone]  Fax: [phone]   Date of encounter: 02/06/22 4:27 PM PATIENT NAME: Anita [name] [address] [place] [address]   [phone] (home)  DOB: 02/01/1961 MRN: [id number] PRIMARY CARE PROVIDER:    [name], NP,  [address] [place] [address] [phone] RESPONSIBLE PARTY:    Contact Information     Name Relation Home Work Mobile   [place] Daughter [phone]        I met face to face with patient in  home. Palliative Care was asked to follow this patient by consultation request of  Anita Koch, NP to address advance care planning and complex medical decision making.  This is a follw up visit                            ASSESSMENT AND PLAN / RECOMMENDATIONS:  Symptom Management/Plan: 1. Advance Care Planning; Full code, will continue to discussions   2. Shortness of breath secondary severe mitral regurgitation, heart failure not surgical candidate; continue with cardiology with CHF clinic visits, weight, lasix as discussed, Discussed importance of rest. Anita [name] endorses some days she is able to more than others including ambulating.   3. Generalized weakness secondary to MS, continue fall precautions; use walker, discussed fall risk. Continue to encourage self independence as able, physical therapy pending evaluation.  Seen today for DME needs. Order for Rollator for stability and fall prevention, can walk < 10 ' before fatiguing, shortness of breath, shuffling unsteady gait DOE, Wt is 138 lbs 63" height (G35) MS (M62.3) immobility (M62.81) generalized weakness (I75.79) combined systolic/diastolic CHF  4. Insomnia, sleep patterns. Revisited stressed sleep hygiene, sleep aids. Improving with current regimen   5. Palliative care encounter; Palliative care encounter; Palliative medicine team will continue to support  patient, patient's family, and medical team. Visit consisted of counseling and education dealing with the complex and emotionally intense issues of symptom management and palliative care in the setting of serious and potentially life-threatening illness  Follow up Palliative Care Visit: Palliative care will continue to follow for complex medical decision making, advance care planning, and clarification of goals. Return 8 weeks or prn.  I spent 62 minutes providing this consultation. More than 50% of the time in this consultation was spent in counseling and care coordination. PPS: 40%  Chief Complaint: Follow up Palliative consult for complex medical decision making  HISTORY OF PRESENT ILLNESS:  Anita [name] is a 61 y.o. year old female  with multiple medical problems including hx of MS, CAD, severe MR with moderate mitral stenosis, COPD, tobacco use, systolic and diastolic heart failure. I called Anita [name] daughter Anita [name] to confirm f/u in person pc visit with covid negative. We talked about how she has been feeling. Anita [name] endorses motivation is challenging. She would rather be sedentary rather than more active. We talked about stimulation, exhaustion, energy levels in the setting of CHF, CAD, MS. Medical goals reviewed. I visited Anita [name] in her home, she was lying in her bed. Anita [name] endorses she is very tired, she has required more lasix which makes her very tired. We talked about energy conservation, functional ability, ambulating with walker, shortness of breath and resting. We talked about symptoms of shortness of breath with exertion, fatigue, weakness, unsteady gait. We talked about fall risk. We talked about the brakes not working on her  rolaid walker. We talked about ordering an new walker, will send order. We talked about pending physical therapy continues to work with Anita [name], as she continues to try. We talked about sleep which has improved recently with  sleep patterns, hygiene. We talked about ros, symptoms. We talked about progressive MS including more difficulty with speech, slower and more difficulty processing thoughts. We talked about appetite, which has been decreased. We talked about foods she likes. We talked about medical goals. We talked about role pc. We talked about f/u pc visit, Anita [name] in agreement, scheduled. We talked about progression of MS, expectations, impact on her chronic disease progression. We talked about functional and cognitive changes. We talked about coping strategies. Anita [name] endorses her brother is also a support person in her life, she believes since he has health problems himself he understands what she has been going through. We talked about quality of life. We talked about f/u pc visit, Anita [name] in agreement, scheduled. Therapeutic listening, emotional support provided. We talked about quality of life. Questions answered.   History obtained from review of EMR, discussion with Anita [name] in person, discussed with daughter Anita [name] by phone I reviewed available labs, medications, imaging, studies and related documents from the EMR.  Records reviewed and summarized above.   ROS 10 point system reviewed all negative except HPI  Physical Exam: Constitutional: NAD General: frail appearing, chronically ill, pleasant female EYES: lids intact ENMT: oral mucous membranes moist CV: S1S2, RRR, no LE edema Pulmonary: LCTA, no increased work of breathing, no cough, room air Abdomen:  soft and non tender MSK: slow, unsteady gait using a rolaid walker Skin: warm and dry Neuro:  + generalized weakness,  + cognitive impairment, +slow speech Psych: non-anxious affect, A and O x 3,   CURRENT PROBLEM LIST:  Patient Active Problem List   Diagnosis Date Noted   Dehydration    Elevated liver enzymes    Mitral valve insufficiency 05/19/2021   UTI (urinary tract infection) 05/19/2021   CHF (congestive  heart failure) (Anita [name]) 05/19/2021   Hypoxia 05/19/2021   Anasarca 05/19/2021   Esophageal dysphagia 01/08/2021   Ischemic cardiomyopathy 10/08/2020   Acute on chronic combined systolic and diastolic CHF (congestive heart failure) ([place]) 10/08/2020   Coronary artery disease involving native coronary artery of native heart without angina pectoris 10/08/2020   IDA (iron deficiency anemia) 10/05/2020   Vitamin B12 deficiency 08/23/2020   COPD (chronic obstructive pulmonary disease) ([place]) 06/22/2020   Tobacco abuse 03/22/2020   Pulmonary hypertension ([place]) 03/22/2020   Retroperitoneal mass 03/22/2020   Insomnia 03/22/2020   NSTEMI (non-ST elevated myocardial infarction) ([place]) 03/20/2020   SIRS (systemic inflammatory response syndrome) ([place]) 03/20/2020   Normocytic anemia 03/20/2020   AKI (acute kidney injury) ([place]) 03/20/2020   Gastroesophageal reflux disease 02/15/2018   Chronic migraine without aura with status migrainosus, not intractable 02/15/2018   Osteoarthritis 02/15/2018   Essential hypertension 02/15/2018   Disturbance of skin sensation 03/26/2012   Multiple sclerosis ([place]) 03/26/2012   Acute back pain 03/26/2012   PAST MEDICAL HISTORY:  Active Ambulatory Problems    Diagnosis Date Noted   Disturbance of skin sensation 03/26/2012   Multiple sclerosis ([place]) 03/26/2012   Acute back pain 03/26/2012   Gastroesophageal reflux disease 02/15/2018   Chronic migraine without aura with status migrainosus, not intractable 02/15/2018   Osteoarthritis 02/15/2018   Essential hypertension 02/15/2018   NSTEMI (non-ST elevated myocardial infarction) ([place]) 03/20/2020   SIRS (systemic inflammatory response syndrome) ([place]) 03/20/2020  Normocytic anemia 03/20/2020   AKI (acute kidney injury) ([place]) 03/20/2020   Tobacco abuse 03/22/2020   Pulmonary hypertension ([place]) 03/22/2020   Retroperitoneal mass 03/22/2020   Insomnia 03/22/2020   COPD (chronic obstructive pulmonary disease) ([place])  06/22/2020   Vitamin B12 deficiency 08/23/2020   IDA (iron deficiency anemia) 10/05/2020   Ischemic cardiomyopathy 10/08/2020   Acute on chronic combined systolic and diastolic CHF (congestive heart failure) ([place]) 10/08/2020   Coronary artery disease involving native coronary artery of native heart without angina pectoris 10/08/2020   Esophageal dysphagia 01/08/2021   Mitral valve insufficiency 05/19/2021   UTI (urinary tract infection) 05/19/2021   CHF (congestive heart failure) ([place]) 05/19/2021   Hypoxia 05/19/2021   Anasarca 05/19/2021   Elevated liver enzymes    Dehydration    Resolved Ambulatory Problems    Diagnosis Date Noted   TOBACCO ABUSE 01/06/2011   Precordial pain 01/06/2011   Encounter for therapeutic drug monitoring 02/19/2012   Optic neuritis, unspecified 03/26/2012   Pain in limb 03/26/2012   Abnormality of gait 07/26/2013   Acute respiratory failure with hypoxia ([place]) 03/20/2020   Prolonged QT interval 03/20/2020   Pneumonia 03/20/2020   Acute respiratory distress 03/20/2020   Respiratory distress 03/20/2020   Atypical pneumonia 03/22/2020   Elevated troponin 03/22/2020   Acute on chronic HFrEF (heart failure with reduced ejection fraction) ([place]) 03/22/2020   Iron deficiency anemia 08/23/2020   Past Medical History:  Diagnosis Date   Allergy    Arthritis    CAD (coronary artery disease)    Chickenpox    Chronic combined systolic (congestive) and diastolic (congestive) heart failure (HCC)    GERD (gastroesophageal reflux disease)    Headache(784.0)    Hearing loss    History of shingles    Migraines    Obese    Optic neuritis    PAH (pulmonary artery hypertension) ([place])    Severe mitral regurgitation    SOCIAL HX:  Social History   Tobacco Use   Smoking status: Former    Packs/day: 1.00    Types: Cigarettes   Smokeless tobacco: Never  Substance Use Topics   Alcohol use: Yes    Comment: Occasional   FAMILY HX:  Family History  Problem  Relation Age of Onset   Skin cancer Mother    Lung cancer Father    Uterine cancer Sister    Heart disease Brother    Bipolar disorder Sister    Throat cancer Paternal Grandmother       ALLERGIES:  Allergies  Allergen Reactions   Acthar Hp [Corticotropin] Other (See Comments)    Throat swelling and coughing up blood   Codeine    Prednisone      PERTINENT MEDICATIONS:  Outpatient Encounter Medications as of 02/06/2022  Medication Sig   albuterol (VENTOLIN HFA) 108 (90 Base) MCG/ACT inhaler INHALE 1-2 PUFFS INTO THE LUNGS EVERY 6 (SIX) HOURS AS NEEDED FOR WHEEZING OR SHORTNESS OF BREATH. USE SPARINGLY!   calcium carbonate (TUMS EX) 750 MG chewable tablet Chew 1 tablet by mouth daily as needed for heartburn.   cetirizine (ZYRTEC) 10 MG tablet Take 10 mg by mouth daily.   diclofenac Sodium (VOLTAREN) 1 % GEL Apply 2 g topically 4 (four) times daily as needed (back or joint pain).   Doxylamine Succinate, Sleep, (SLEEP AID PO) Take 1 tablet by mouth at bedtime as needed (sleep).   feeding supplement, ENSURE ENLIVE, (ENSURE ENLIVE) LIQD Take 237 mLs by mouth 2 (two) times daily between  meals.   ferrous sulfate 325 (65 FE) MG EC tablet TAKE 1 TABLET BY MOUTH 2 TIMES DAILY WITH A MEAL.   fluticasone-salmeterol (ADVAIR DISKUS) 250-50 MCG/ACT AEPB INHALE 1 PUFF INTO THE LUNGS TWICE A DAY   guaiFENesin (MUCINEX) 600 MG 12 hr tablet Take 600 mg by mouth 2 (two) times daily as needed for cough.   metoprolol succinate (TOPROL-XL) 25 MG 24 hr tablet Take 1 tablet (25 mg total) by mouth daily. Take with or immediately following a meal.   Multiple Vitamin (MULTIVITAMIN WITH MINERALS) TABS tablet Take 1 tablet by mouth daily.   nitroGLYCERIN (NITROSTAT) 0.4 MG SL tablet Place 1 tablet (0.4 mg total) under the tongue every 5 (five) minutes as needed for chest pain.   omeprazole (PRILOSEC) 40 MG capsule Take 1 capsule (40 mg total) by mouth daily.   Probiotic Product (PROBIOTIC ADVANCED) CAPS Take 1  capsule by mouth daily.   torsemide (DEMADEX) 20 MG tablet Take for weight gain of 2 lbs in 1 day or 5 lbs in 1 week or obvious swelling.   vitamin B-12 (CYANOCOBALAMIN) 1000 MCG tablet Take 1,000 mcg by mouth daily.   No facility-administered encounter medications on file as of 02/06/2022.   Thank you for the opportunity to participate in the care of Ms. Burak.  The palliative care team will continue to follow. Please call our office at [phone] if we can be of additional assistance.   Andreia Gandolfi Z Jerold Yoss, NP ,   COVID-19 PATIENT SCREENING TOOL Asked and negative response unless otherwise noted:  Have you had symptoms of covid, tested positive or been in contact with someone with symptoms/positive test in the past 5-10 days? NO

## 2022-02-13 DIAGNOSIS — I951 Orthostatic hypotension: Secondary | ICD-10-CM | POA: Diagnosis not present

## 2022-02-13 DIAGNOSIS — I5042 Chronic combined systolic (congestive) and diastolic (congestive) heart failure: Secondary | ICD-10-CM | POA: Diagnosis not present

## 2022-02-13 DIAGNOSIS — I272 Pulmonary hypertension, unspecified: Secondary | ICD-10-CM | POA: Diagnosis not present

## 2022-02-13 DIAGNOSIS — I130 Hypertensive heart and chronic kidney disease with heart failure and stage 1 through stage 4 chronic kidney disease, or unspecified chronic kidney disease: Secondary | ICD-10-CM | POA: Diagnosis not present

## 2022-02-13 DIAGNOSIS — N1831 Chronic kidney disease, stage 3a: Secondary | ICD-10-CM | POA: Diagnosis not present

## 2022-02-13 DIAGNOSIS — G35 Multiple sclerosis: Secondary | ICD-10-CM | POA: Diagnosis not present

## 2022-02-19 DIAGNOSIS — I5042 Chronic combined systolic (congestive) and diastolic (congestive) heart failure: Secondary | ICD-10-CM | POA: Diagnosis not present

## 2022-02-19 DIAGNOSIS — I130 Hypertensive heart and chronic kidney disease with heart failure and stage 1 through stage 4 chronic kidney disease, or unspecified chronic kidney disease: Secondary | ICD-10-CM | POA: Diagnosis not present

## 2022-02-19 DIAGNOSIS — G35 Multiple sclerosis: Secondary | ICD-10-CM | POA: Diagnosis not present

## 2022-02-19 DIAGNOSIS — N1831 Chronic kidney disease, stage 3a: Secondary | ICD-10-CM | POA: Diagnosis not present

## 2022-02-19 DIAGNOSIS — I272 Pulmonary hypertension, unspecified: Secondary | ICD-10-CM | POA: Diagnosis not present

## 2022-02-19 DIAGNOSIS — I951 Orthostatic hypotension: Secondary | ICD-10-CM | POA: Diagnosis not present

## 2022-02-25 DIAGNOSIS — N1831 Chronic kidney disease, stage 3a: Secondary | ICD-10-CM | POA: Diagnosis not present

## 2022-02-25 DIAGNOSIS — I5042 Chronic combined systolic (congestive) and diastolic (congestive) heart failure: Secondary | ICD-10-CM | POA: Diagnosis not present

## 2022-02-25 DIAGNOSIS — M1990 Unspecified osteoarthritis, unspecified site: Secondary | ICD-10-CM | POA: Diagnosis not present

## 2022-02-25 DIAGNOSIS — Z8701 Personal history of pneumonia (recurrent): Secondary | ICD-10-CM | POA: Diagnosis not present

## 2022-02-25 DIAGNOSIS — Z87440 Personal history of urinary (tract) infections: Secondary | ICD-10-CM | POA: Diagnosis not present

## 2022-02-25 DIAGNOSIS — I951 Orthostatic hypotension: Secondary | ICD-10-CM | POA: Diagnosis not present

## 2022-02-25 DIAGNOSIS — D509 Iron deficiency anemia, unspecified: Secondary | ICD-10-CM | POA: Diagnosis not present

## 2022-02-25 DIAGNOSIS — G35 Multiple sclerosis: Secondary | ICD-10-CM | POA: Diagnosis not present

## 2022-02-25 DIAGNOSIS — G40909 Epilepsy, unspecified, not intractable, without status epilepticus: Secondary | ICD-10-CM | POA: Diagnosis not present

## 2022-02-25 DIAGNOSIS — I252 Old myocardial infarction: Secondary | ICD-10-CM | POA: Diagnosis not present

## 2022-02-25 DIAGNOSIS — I130 Hypertensive heart and chronic kidney disease with heart failure and stage 1 through stage 4 chronic kidney disease, or unspecified chronic kidney disease: Secondary | ICD-10-CM | POA: Diagnosis not present

## 2022-02-25 DIAGNOSIS — E785 Hyperlipidemia, unspecified: Secondary | ICD-10-CM | POA: Diagnosis not present

## 2022-02-25 DIAGNOSIS — G4700 Insomnia, unspecified: Secondary | ICD-10-CM | POA: Diagnosis not present

## 2022-02-25 DIAGNOSIS — I4581 Long QT syndrome: Secondary | ICD-10-CM | POA: Diagnosis not present

## 2022-02-25 DIAGNOSIS — J439 Emphysema, unspecified: Secondary | ICD-10-CM | POA: Diagnosis not present

## 2022-02-25 DIAGNOSIS — R1319 Other dysphagia: Secondary | ICD-10-CM | POA: Diagnosis not present

## 2022-02-25 DIAGNOSIS — G43909 Migraine, unspecified, not intractable, without status migrainosus: Secondary | ICD-10-CM | POA: Diagnosis not present

## 2022-02-25 DIAGNOSIS — H9191 Unspecified hearing loss, right ear: Secondary | ICD-10-CM | POA: Diagnosis not present

## 2022-02-25 DIAGNOSIS — I272 Pulmonary hypertension, unspecified: Secondary | ICD-10-CM | POA: Diagnosis not present

## 2022-02-25 DIAGNOSIS — I255 Ischemic cardiomyopathy: Secondary | ICD-10-CM | POA: Diagnosis not present

## 2022-02-25 DIAGNOSIS — K219 Gastro-esophageal reflux disease without esophagitis: Secondary | ICD-10-CM | POA: Diagnosis not present

## 2022-02-25 DIAGNOSIS — F5101 Primary insomnia: Secondary | ICD-10-CM | POA: Diagnosis not present

## 2022-02-25 DIAGNOSIS — I340 Nonrheumatic mitral (valve) insufficiency: Secondary | ICD-10-CM | POA: Diagnosis not present

## 2022-02-25 DIAGNOSIS — I2510 Atherosclerotic heart disease of native coronary artery without angina pectoris: Secondary | ICD-10-CM | POA: Diagnosis not present

## 2022-02-25 DIAGNOSIS — Z87891 Personal history of nicotine dependence: Secondary | ICD-10-CM | POA: Diagnosis not present

## 2022-03-06 DIAGNOSIS — I951 Orthostatic hypotension: Secondary | ICD-10-CM | POA: Diagnosis not present

## 2022-03-06 DIAGNOSIS — G35 Multiple sclerosis: Secondary | ICD-10-CM | POA: Diagnosis not present

## 2022-03-06 DIAGNOSIS — N1831 Chronic kidney disease, stage 3a: Secondary | ICD-10-CM | POA: Diagnosis not present

## 2022-03-06 DIAGNOSIS — I272 Pulmonary hypertension, unspecified: Secondary | ICD-10-CM | POA: Diagnosis not present

## 2022-03-06 DIAGNOSIS — I5042 Chronic combined systolic (congestive) and diastolic (congestive) heart failure: Secondary | ICD-10-CM | POA: Diagnosis not present

## 2022-03-06 DIAGNOSIS — I130 Hypertensive heart and chronic kidney disease with heart failure and stage 1 through stage 4 chronic kidney disease, or unspecified chronic kidney disease: Secondary | ICD-10-CM | POA: Diagnosis not present

## 2022-03-19 DIAGNOSIS — I272 Pulmonary hypertension, unspecified: Secondary | ICD-10-CM | POA: Diagnosis not present

## 2022-03-19 DIAGNOSIS — N1831 Chronic kidney disease, stage 3a: Secondary | ICD-10-CM | POA: Diagnosis not present

## 2022-03-19 DIAGNOSIS — I130 Hypertensive heart and chronic kidney disease with heart failure and stage 1 through stage 4 chronic kidney disease, or unspecified chronic kidney disease: Secondary | ICD-10-CM | POA: Diagnosis not present

## 2022-03-19 DIAGNOSIS — I951 Orthostatic hypotension: Secondary | ICD-10-CM | POA: Diagnosis not present

## 2022-03-19 DIAGNOSIS — I5042 Chronic combined systolic (congestive) and diastolic (congestive) heart failure: Secondary | ICD-10-CM | POA: Diagnosis not present

## 2022-03-19 DIAGNOSIS — G35 Multiple sclerosis: Secondary | ICD-10-CM | POA: Diagnosis not present

## 2022-03-28 ENCOUNTER — Encounter (HOSPITAL_BASED_OUTPATIENT_CLINIC_OR_DEPARTMENT_OTHER): Payer: Self-pay | Admitting: Cardiology

## 2022-03-28 ENCOUNTER — Ambulatory Visit (INDEPENDENT_AMBULATORY_CARE_PROVIDER_SITE_OTHER): Payer: Medicare Other | Admitting: Cardiology

## 2022-03-28 VITALS — BP 122/60 | HR 85 | Ht 63.0 in | Wt 135.4 lb

## 2022-03-28 DIAGNOSIS — I255 Ischemic cardiomyopathy: Secondary | ICD-10-CM | POA: Diagnosis not present

## 2022-03-28 DIAGNOSIS — I4729 Other ventricular tachycardia: Secondary | ICD-10-CM

## 2022-03-28 DIAGNOSIS — I5042 Chronic combined systolic (congestive) and diastolic (congestive) heart failure: Secondary | ICD-10-CM | POA: Diagnosis not present

## 2022-03-28 DIAGNOSIS — I25118 Atherosclerotic heart disease of native coronary artery with other forms of angina pectoris: Secondary | ICD-10-CM | POA: Diagnosis not present

## 2022-03-28 DIAGNOSIS — I340 Nonrheumatic mitral (valve) insufficiency: Secondary | ICD-10-CM | POA: Diagnosis not present

## 2022-03-28 DIAGNOSIS — I471 Supraventricular tachycardia: Secondary | ICD-10-CM

## 2022-03-28 DIAGNOSIS — R0602 Shortness of breath: Secondary | ICD-10-CM

## 2022-03-28 NOTE — Progress Notes (Signed)
?Cardiology Office Note:   ? ?Date:  04/04/2022  ? ?ID:  Anita [name], DOB Sep 23, 1961, MRN [id number] ? ?PCP:  [name], NP  ?Cardiologist:  Buford Dresser, MD ? ?Referring MD: [name], NP  ? ?CC: followup  ? ?History of Present Illness:   ? ?Anita [name] is a 61 y.o. female with history of multiple sclerosis, CAD, severe mitral regurgitation with moderate mitral stenosis, likely COPD, prior tobacco use. I met her in the hospital 03/2020, see workup below. ? ?Today: ?Here with her daughter today. ? ?Just finished with physical therapy, but feeling frustrated by mobility issues, is largely housebound. Has a new neurologist (Dr. Tomi Likens), tried to get MRI but she could not tolerate.  ? ?Trying to balance mobility/strength with needing care and assistance. This has been an ongoing compromise. ? ?Palpitations better on metoprolol, has occasional both sharp and dull chest pain at rest.  ? ?Past Medical History:  ?Diagnosis Date  ? Abnormality of gait 07/26/2013  ? Acute respiratory failure with hypoxia ([place]) 03/20/2020  ? Allergy   ? Arthritis   ? Atypical pneumonia 03/22/2020  ? CAD (coronary artery disease)   ? a. 03/2020 Cath: LM nl, LAD 60/34m 55d, D1 70, D2 70, LCX 45ost/p, 65p, RCA 100ost CTO. RPDA fills via collats from dLAD. Inf septal fills via collats from 1st septal, RPAV fills via collats from LPAV, RPL1/2 sev dzs-->med rx.  ? Chickenpox   ? Chronic combined systolic (congestive) and diastolic (congestive) heart failure (HCC)   ? a. 02/2021 Echo: EF 40-45%, glob HK, gr2 DD. Sev red RV fxn, RVSP 69.240mg. Mod dil LA, sev dil RA. Sev MR. Mild to mod MS. Mean MV grad 6.60m60m. Mod-Sev TR. Mild AI. Mild Ao sclerosis w/o stenosis.  ? COPD (chronic obstructive pulmonary disease) ([place] ? Elevated troponin 03/22/2020  ? GERD (gastroesophageal reflux disease)   ? Headache(784.0)   ? Migraine  ? Hearing loss   ? Right ear secondary to infection  ? History of shingles   ?  Ischemic cardiomyopathy   ? a. 02/2021 Echo: EF 40-45%, glob HK.  ? Migraines   ? Multiple sclerosis ([facility] ? Obese   ? Optic neuritis   ? PAH (pulmonary artery hypertension) ([place] ? a. 02/2021 Echo: RVSP 69.2mm48m  ? Prolonged QT interval 03/20/2020  ? Severe mitral regurgitation   ? a. 02/2021 Echo: Severe MR w/ mild to mod MS.  Mean MV grad 6.60mmH60m ? ? ?Past Surgical History:  ?Procedure Laterality Date  ? COLONOSCOPY WITH PROPOFOL N/A 08/03/2020  ? Procedure: COLONOSCOPY WITH PROPOFOL;  Surgeon: Anna,[name]  [facility];  Service: Gastroenterology;  Laterality: N/A;  ? Ganglioneuroma    ? Resection  ? LEFT HEART CATH AND CORONARY ANGIOGRAPHY N/A 03/23/2020  ? Procedure: LEFT HEART CATH AND CORONARY ANGIOGRAPHY;  Surgeon: HardiLeonie Man  [facility];  Service: Cardiovascular;  Laterality: N/A;  ? PILONIDAL CYST EXCISION    ? PILONIDAL CYST EXCISION  1983  ? TUMOR REMOVAL  2007/2008  ? ? ?Current Medications: ?Current Outpatient Medications on File Prior to Visit  ?Medication Sig  ? albuterol (VENTOLIN HFA) 108 (90 Base) MCG/ACT inhaler INHALE 1-2 PUFFS INTO THE LUNGS EVERY 6 (SIX) HOURS AS NEEDED FOR WHEEZING OR SHORTNESS OF BREATH. USE SPARINGLY!  ? calcium carbonate (TUMS EX) 750 MG chewable tablet Chew 1 tablet by mouth daily as needed for heartburn.  ? cetirizine (  ZYRTEC) 10 MG tablet Take 10 mg by mouth daily.  ? Doxylamine Succinate, Sleep, (SLEEP AID PO) Take 1 tablet by mouth at bedtime as needed (sleep).  ? feeding supplement, ENSURE ENLIVE, (ENSURE ENLIVE) LIQD Take 237 mLs by mouth 2 (two) times daily between meals.  ? ferrous sulfate 325 (65 FE) MG EC tablet TAKE 1 TABLET BY MOUTH 2 TIMES DAILY WITH A MEAL.  ? fluticasone-salmeterol (ADVAIR DISKUS) 250-50 MCG/ACT AEPB INHALE 1 PUFF INTO THE LUNGS TWICE A DAY  ? guaiFENesin (MUCINEX) 600 MG 12 hr tablet Take 600 mg by mouth 2 (two) times daily as needed for cough.  ? metoprolol succinate (TOPROL-XL) 25 MG 24 hr  tablet Take 1 tablet (25 mg total) by mouth daily. Take with or immediately following a meal.  ? Multiple Vitamin (MULTIVITAMIN WITH MINERALS) TABS tablet Take 1 tablet by mouth daily.  ? nitroGLYCERIN (NITROSTAT) 0.4 MG SL tablet Place 1 tablet (0.4 mg total) under the tongue every 5 (five) minutes as needed for chest pain.  ? omeprazole (PRILOSEC) 40 MG capsule Take 1 capsule (40 mg total) by mouth daily.  ? Probiotic Product (PROBIOTIC ADVANCED) CAPS Take 1 capsule by mouth daily.  ? torsemide (DEMADEX) 20 MG tablet Take for weight gain of 2 lbs in 1 day or 5 lbs in 1 week or obvious swelling.  ? trolamine salicylate (ASPERCREME) 10 % cream Apply 1 application. topically daily as needed for muscle pain (back or joint pain).  ? vitamin B-12 (CYANOCOBALAMIN) 1000 MCG tablet Take 1,000 mcg by mouth daily.  ? ?No current facility-administered medications on file prior to visit.  ?  ? ?Allergies:   Acthar hp [corticotropin], Codeine, and Prednisone  ? ?Social History  ? ?Tobacco Use  ? Smoking status: Former  ?  Packs/day: 1.00  ?  Types: Cigarettes  ? Smokeless tobacco: Never  ?Vaping Use  ? Vaping Use: Never used  ?Substance Use Topics  ? Alcohol use: Yes  ?  Comment: Occasional  ? ? ?Family History: ?family history includes Bipolar disorder in her sister; Heart disease in her brother; Lung cancer in her father; Skin cancer in her mother; Throat cancer in her paternal grandmother; Uterine cancer in her sister. ? ?ROS:   ?Please see the history of present illness.  ? ?Additional pertinent ROS otherwise unremarkable.  ? ? ?EKGs/Labs/Other Studies Reviewed:   ? ?The following studies were reviewed today: ? ?Monitor 11/2021: ?Patch Wear Time:  6 days and 18 hours ?  ?Patient had a min HR of 66 bpm, max HR of 207 bpm, and avg HR of 94 bpm. Predominant underlying rhythm was Sinus Rhythm. 2 Ventricular Tachycardia runs occurred, the run with the fastest interval lasting 11 beats with a max rate of 182 bpm (avg 175 bpm);   the run with the fastest interval was also the longest. 20 Supraventricular Tachycardia runs occurred, the run with the fastest interval lasting 53 mins 7 secs with a max rate of 207 bpm (avg 174 bpm); the run with the fastest interval was also the longest.  No atrial fibrillation, high degree block, or pauses noted. Isolated atrial and ventricular ectopy was rare to occasional (~1%). There were 5 triggered events. These events were sinus with SVT, PACs, and PVCs. ? ?Left UE Venous Doppler 05/18/2021: ?COMPARISON:  None. ?  ?FINDINGS: ?Contralateral Subclavian Vein: Respiratory phasicity is normal and ?symmetric with the symptomatic side. No evidence of thrombus. Normal ?compressibility. ?  ?Internal Jugular Vein: No evidence of thrombus. Normal ?  compressibility, respiratory phasicity and response to augmentation. ?  ?Subclavian Vein: No evidence of thrombus. Normal compressibility, ?respiratory phasicity and response to augmentation. ?  ?Axillary Vein: No evidence of thrombus. Normal compressibility, ?respiratory phasicity and response to augmentation. ?  ?Cephalic Vein: No evidence of thrombus. Normal compressibility, ?respiratory phasicity and response to augmentation. ?  ?Basilic Vein: No evidence of thrombus. Normal compressibility, ?respiratory phasicity and response to augmentation. ?  ?Brachial Veins: No evidence of thrombus. Normal compressibility, ?respiratory phasicity and response to augmentation. ?  ?Radial Veins: No evidence of thrombus. Normal compressibility, ?respiratory phasicity and response to augmentation. ?  ?Ulnar Veins: No evidence of thrombus. Normal compressibility, ?respiratory phasicity and response to augmentation. ?  ?Venous Reflux:  None visualized. ?  ?Other Findings:  None visualized. ?  ?IMPRESSION: ?No evidence of DVT within the left upper extremity. ?  ?Diffuse subcutaneous edema. ? ?Echo 3.3.22 ?1. Left ventricular ejection fraction, by estimation, is 40 to 45%. Left  ?ventricular  ejection fraction by 3D volume is 47 %. The left ventricle has  ?mildly decreased function. The left ventricle demonstrates global  ?hypokinesis. Left ventricular diastolic  ? parameters are consistent with

## 2022-03-28 NOTE — Patient Instructions (Signed)
Medication Instructions:  ?Your Physician recommend you continue on your current medication as directed.   ? ?*If you need a refill on your cardiac medications before your next appointment, please call your pharmacy* ? ? ?Lab Work: ?None ordered today ? ? ?Testing/Procedures: ?None ordered today ? ? ?Follow-Up: ?At [facility], you and your health needs are our priority.  As part of our continuing mission to provide you with exceptional heart care, we have created designated Provider Care Teams.  These Care Teams include your primary Cardiologist (physician) and Advanced Practice Providers (APPs -  Physician Assistants and Nurse Practitioners) who all work together to provide you with the care you need, when you need it. ? ?We recommend signing up for the patient portal called "MyChart".  Sign up information is provided on this After Visit Summary.  MyChart is used to connect with patients for Virtual Visits (Telemedicine).  Patients are able to view lab/test results, encounter notes, upcoming appointments, etc.  Non-urgent messages can be sent to your provider as well.   ?To learn more about what you can do with MyChart, go to [email or web address].   ? ?Your next appointment:   ?6 month(s) ? ?The format for your next appointment:   ?In Person ? ?Provider:   ?Bridgette Christopher, MD{ ? ? ?Important Information About Sugar ? ? ? ? ? ? ?

## 2022-03-31 ENCOUNTER — Other Ambulatory Visit: Payer: Self-pay | Admitting: Gastroenterology

## 2022-04-04 ENCOUNTER — Encounter (HOSPITAL_BASED_OUTPATIENT_CLINIC_OR_DEPARTMENT_OTHER): Payer: Self-pay | Admitting: Cardiology

## 2022-04-07 ENCOUNTER — Inpatient Hospital Stay: Payer: Medicare Other | Attending: Oncology

## 2022-04-07 DIAGNOSIS — R58 Hemorrhage, not elsewhere classified: Secondary | ICD-10-CM | POA: Insufficient documentation

## 2022-04-07 DIAGNOSIS — K648 Other hemorrhoids: Secondary | ICD-10-CM | POA: Insufficient documentation

## 2022-04-07 DIAGNOSIS — I255 Ischemic cardiomyopathy: Secondary | ICD-10-CM | POA: Insufficient documentation

## 2022-04-07 DIAGNOSIS — J449 Chronic obstructive pulmonary disease, unspecified: Secondary | ICD-10-CM | POA: Diagnosis not present

## 2022-04-07 DIAGNOSIS — Z818 Family history of other mental and behavioral disorders: Secondary | ICD-10-CM | POA: Insufficient documentation

## 2022-04-07 DIAGNOSIS — E538 Deficiency of other specified B group vitamins: Secondary | ICD-10-CM | POA: Diagnosis not present

## 2022-04-07 DIAGNOSIS — Z801 Family history of malignant neoplasm of trachea, bronchus and lung: Secondary | ICD-10-CM | POA: Diagnosis not present

## 2022-04-07 DIAGNOSIS — Z8049 Family history of malignant neoplasm of other genital organs: Secondary | ICD-10-CM | POA: Diagnosis not present

## 2022-04-07 DIAGNOSIS — R110 Nausea: Secondary | ICD-10-CM | POA: Insufficient documentation

## 2022-04-07 DIAGNOSIS — Z87891 Personal history of nicotine dependence: Secondary | ICD-10-CM | POA: Diagnosis not present

## 2022-04-07 DIAGNOSIS — Z885 Allergy status to narcotic agent status: Secondary | ICD-10-CM | POA: Insufficient documentation

## 2022-04-07 DIAGNOSIS — E611 Iron deficiency: Secondary | ICD-10-CM | POA: Diagnosis not present

## 2022-04-07 DIAGNOSIS — Z8249 Family history of ischemic heart disease and other diseases of the circulatory system: Secondary | ICD-10-CM | POA: Diagnosis not present

## 2022-04-07 DIAGNOSIS — I129 Hypertensive chronic kidney disease with stage 1 through stage 4 chronic kidney disease, or unspecified chronic kidney disease: Secondary | ICD-10-CM | POA: Diagnosis not present

## 2022-04-07 DIAGNOSIS — N1831 Chronic kidney disease, stage 3a: Secondary | ICD-10-CM | POA: Insufficient documentation

## 2022-04-07 DIAGNOSIS — Z808 Family history of malignant neoplasm of other organs or systems: Secondary | ICD-10-CM | POA: Insufficient documentation

## 2022-04-07 DIAGNOSIS — I2510 Atherosclerotic heart disease of native coronary artery without angina pectoris: Secondary | ICD-10-CM | POA: Diagnosis not present

## 2022-04-07 DIAGNOSIS — D631 Anemia in chronic kidney disease: Secondary | ICD-10-CM | POA: Insufficient documentation

## 2022-04-07 DIAGNOSIS — Z888 Allergy status to other drugs, medicaments and biological substances status: Secondary | ICD-10-CM | POA: Diagnosis not present

## 2022-04-07 DIAGNOSIS — Z79899 Other long term (current) drug therapy: Secondary | ICD-10-CM | POA: Insufficient documentation

## 2022-04-07 DIAGNOSIS — I5042 Chronic combined systolic (congestive) and diastolic (congestive) heart failure: Secondary | ICD-10-CM | POA: Diagnosis not present

## 2022-04-07 DIAGNOSIS — R5383 Other fatigue: Secondary | ICD-10-CM | POA: Diagnosis not present

## 2022-04-07 LAB — IRON AND TIBC
Iron: 40 ug/dL (ref 28–170)
Saturation Ratios: 12 % (ref 10.4–31.8)
TIBC: 335 ug/dL (ref 250–450)
UIBC: 295 ug/dL

## 2022-04-07 LAB — CBC WITH DIFFERENTIAL/PLATELET
Abs Immature Granulocytes: 0.01 10*3/uL (ref 0.00–0.07)
Basophils Absolute: 0.1 10*3/uL (ref 0.0–0.1)
Basophils Relative: 1 %
Eosinophils Absolute: 0.2 10*3/uL (ref 0.0–0.5)
Eosinophils Relative: 2 %
HCT: 37.5 % (ref 36.0–46.0)
Hemoglobin: 11.5 g/dL — ABNORMAL LOW (ref 12.0–15.0)
Immature Granulocytes: 0 %
Lymphocytes Relative: 8 %
Lymphs Abs: 0.6 10*3/uL — ABNORMAL LOW (ref 0.7–4.0)
MCH: 26.9 pg (ref 26.0–34.0)
MCHC: 30.7 g/dL (ref 30.0–36.0)
MCV: 87.8 fL (ref 80.0–100.0)
Monocytes Absolute: 0.7 10*3/uL (ref 0.1–1.0)
Monocytes Relative: 11 %
Neutro Abs: 5.3 10*3/uL (ref 1.7–7.7)
Neutrophils Relative %: 78 %
Platelets: 196 10*3/uL (ref 150–400)
RBC: 4.27 MIL/uL (ref 3.87–5.11)
RDW: 16.6 % — ABNORMAL HIGH (ref 11.5–15.5)
WBC: 6.8 10*3/uL (ref 4.0–10.5)
nRBC: 0 % (ref 0.0–0.2)

## 2022-04-07 LAB — FERRITIN: Ferritin: 106 ng/mL (ref 11–307)

## 2022-04-09 ENCOUNTER — Encounter: Payer: Self-pay | Admitting: Oncology

## 2022-04-09 ENCOUNTER — Inpatient Hospital Stay: Payer: Medicare Other

## 2022-04-09 ENCOUNTER — Inpatient Hospital Stay (HOSPITAL_BASED_OUTPATIENT_CLINIC_OR_DEPARTMENT_OTHER): Payer: Medicare Other | Admitting: Oncology

## 2022-04-09 VITALS — BP 156/100 | HR 61 | Temp 97.1°F | Resp 18 | Wt 137.5 lb

## 2022-04-09 DIAGNOSIS — N1831 Chronic kidney disease, stage 3a: Secondary | ICD-10-CM | POA: Diagnosis not present

## 2022-04-09 DIAGNOSIS — D508 Other iron deficiency anemias: Secondary | ICD-10-CM | POA: Diagnosis not present

## 2022-04-09 DIAGNOSIS — R58 Hemorrhage, not elsewhere classified: Secondary | ICD-10-CM | POA: Diagnosis not present

## 2022-04-09 DIAGNOSIS — E611 Iron deficiency: Secondary | ICD-10-CM | POA: Diagnosis not present

## 2022-04-09 DIAGNOSIS — I129 Hypertensive chronic kidney disease with stage 1 through stage 4 chronic kidney disease, or unspecified chronic kidney disease: Secondary | ICD-10-CM | POA: Diagnosis not present

## 2022-04-09 DIAGNOSIS — D631 Anemia in chronic kidney disease: Secondary | ICD-10-CM | POA: Diagnosis not present

## 2022-04-09 DIAGNOSIS — E538 Deficiency of other specified B group vitamins: Secondary | ICD-10-CM | POA: Diagnosis not present

## 2022-04-09 MED ORDER — FERROUS SULFATE 325 (65 FE) MG PO TBEC: 1.0000 | DELAYED_RELEASE_TABLET | Freq: Two times a day (BID) | ORAL | 3 refills | Status: DC

## 2022-04-09 NOTE — Progress Notes (Signed)
Pt in clinic with daughter today for follow up. No new concerns voiced.  ? ?

## 2022-04-09 NOTE — Progress Notes (Signed)
?Hematology/Oncology Progress note ?[phone] [phone] ?  ? ? ? ?Patient Care Team: ?[name], NP as PCP - General (Internal Medicine) ?Buford Dresser, MD as PCP - Cardiology (Cardiology) ?Earlie Server, MD as Consulting Physician (Hematology and Oncology) ?Pieter Partridge, DO as Consulting Physician (Neurology) ? ?REFERRING PROVIDER: ?[name], NP ? ?CHIEF COMPLAINTS/REASON FOR VISIT:  ?Follow up of anemia ? ?HISTORY OF PRESENTING ILLNESS:  ?Anita [name] is a  61 y.o.  female with PMH listed below who was referred to me for evaluation of anemia ?Reviewed patient's recent labs that was done.  ?05/03/2020 labs revealed anemia with hemoglobin of 8.9, MCV 82.9 ?07/02/2020, iron saturation 11, ferritin 91, TIBC 307, iron 34. ?Reviewed patient's previous labs , anemia is chronic onset , duration is since April 2021.  She did not have much baseline blood work prior to April 2021.  She did have some remote blood work that was done in 2012 when she had a normal hemoglobin of 13.5. ?Patient has multiple medical problems.  She is a poor historian.  Daughter provides most of her 57. ?Patient was hospitalized in April 2021 due to acute respiratory failure with hypoxia, due to atypical pneumonia, non-STEMI, severe three-vessel disease and pulmonary hypertension, AKI. 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 ?She has a history of ganglioneuroma back in 2007 and was seen by Dr. Eston Esters in 2007.  Patient declined pursuing further diagnosis or therapeutic options. ?She also has a chronic history of MS. ? ?Patient was recently seen by gastroenterology and had colonoscopy done. ?5 mm transverse colon polyp which was resected and retrieved.  Diverticulosis.  Nonbleeding internal hemorrhoids. ?Patient denies any bloody or black bowel movement.  She continues to have chronic nausea with no vomiting. ?Appetite is not good.  Weight has  been relatively stable.  She also reports bilateral upper extremity ecchymosis which is a chronic issue for her. ? ? ? ?INTERVAL HISTORY ?VERBA AINLEY is a 61 y.o. female who has above history reviewed by me today presents for follow up visit for management of Anemia ?Patient was accompanied by daughter. ?Patient has been on oral iron supplementation.  Recently, per daughter, patient has been forgetting afternoon/evening dose of oral iron supplementation. ?She denies any blood in the stool. ? ? ?Review of Systems  ?Constitutional:  Positive for fatigue. Negative for appetite change, chills, fever and unexpected weight change.  ?HENT:   Negative for hearing loss and voice change.   ?Eyes:  Negative for eye problems.  ?Respiratory:  Negative for chest tightness and cough.   ?Cardiovascular:  Negative for chest pain.  ?Gastrointestinal:  Negative for abdominal distention, abdominal pain, blood in stool and nausea.  ?Endocrine: Negative for hot flashes.  ?Genitourinary:  Negative for difficulty urinating and frequency.   ?Musculoskeletal:  Negative for arthralgias.  ?Skin:  Negative for itching and rash.  ?Neurological:  Negative for extremity weakness.  ?Hematological:  Negative for adenopathy. Bruises/bleeds easily.  ?Psychiatric/Behavioral:  Negative for confusion.   ? ? ?MEDICAL HISTORY:  ?Past Medical History:  ?Diagnosis Date  ? Abnormality of gait 07/26/2013  ? Acute respiratory failure with hypoxia ([place]) 03/20/2020  ? Allergy   ? Arthritis   ? Atypical pneumonia 03/22/2020  ? CAD (coronary artery disease)   ? a. 03/2020 Cath: LM nl, LAD 60/71m 55d, D1 70, D2 70, LCX 45ost/p, 65p, RCA 100ost CTO. RPDA fills via collats from dLAD. Inf septal fills via collats from 1st  septal, RPAV fills via collats from LPAV, RPL1/2 sev dzs-->med rx.  ? Chickenpox   ? Chronic combined systolic (congestive) and diastolic (congestive) heart failure (HCC)   ? a. 02/2021 Echo: EF 40-45%, glob HK, gr2 DD. Sev red RV fxn, RVSP  69.254mHg. Mod dil LA, sev dil RA. Sev MR. Mild to mod MS. Mean MV grad 6.531mg. Mod-Sev TR. Mild AI. Mild Ao sclerosis w/o stenosis.  ? COPD (ch[facility] obstructive pulmonary disease) ([facility]  ? Elevated troponin 03/22/2020  ? GERD (gastroesophageal reflux disease)   ? Headache(784.0)   ? Migraine  ? Hearing loss   ? Right ear secondary to infection  ? History of shingles   ? Ischemic cardiomyopathy   ? a. 02/2021 Echo: EF 40-45%, glob HK.  ? Migraines   ? Multiple sclerosis ([facility]  ? Obese   ? Optic neuritis   ? PAH (pulmonary artery hypertension) ([facility]  ? a. 02/2021 Echo: RVSP 69.54m59m.  ? Prolonged QT interval 03/20/2020  ? Severe mitral regurgitation   ? a. 02/2021 Echo: Severe MR w/ mild to mod MS.  Mean MV grad 6.5mm73m  ? ? ?SURGICAL HISTORY: ?Past Surgical History:  ?Procedure Laterality Date  ? COLONOSCOPY WITH PROPOFOL N/A 08/03/2020  ? Procedure: COLONOSCOPY WITH PROPOFOL;  Surgeon: AnnaJonathon Bellows;  [facility];  Service: Gastroenterology;  Laterality: N/A;  ? Ganglioneuroma    ? Resection  ? LEFT HEART CATH AND CORONARY ANGIOGRAPHY N/A 03/23/2020  ? Procedure: LEFT HEART CATH AND CORONARY ANGIOGRAPHY;  Surgeon: HardLeonie Man;  [facility];  Service: Cardiovascular;  Laterality: N/A;  ? PILONIDAL CYST EXCISION    ? PILONIDAL CYST EXCISION  1983  ? TUMOR REMOVAL  2007/2008  ? ? ?SOCIAL HISTORY: ?Social History  ? ?Socioeconomic History  ? Marital status: Divorced  ?  Spouse name: Not on file  ? Number of children: 2  ? Years of education: Not on file  ? Highest education level: Not on file  ?Occupational History  ? Not on file  ?Tobacco Use  ? Smoking status: Former  ?  Packs/day: 1.00  ?  Types: Cigarettes  ? Smokeless tobacco: Never  ?Vaping Use  ? Vaping Use: Never used  ?Substance and Sexual Activity  ? Alcohol use: Yes  ?  Comment: Occasional  ? Drug use: Not on file  ? Sexual activity: Not on file  ?Other Topics Concern  ? Not on file  ?Social History Narrative  ? Right  handed   ? Lives in one story home  ? Drinks Caffeine - Sweet Tea   ? ?Social Determinants of Health  ? ?Financial Resource Strain: Not on file  ?Food Insecurity: Not on file  ?Transportation Needs: Not on file  ?Physical Activity: Not on file  ?Stress: Not on file  ?Social Connections: Not on file  ?Intimate Partner Violence: Not on file  ? ? ?FAMILY HISTORY: ?Family History  ?Problem Relation Age of Onset  ? Skin cancer Mother   ? Lung cancer Father   ? Uterine cancer Sister   ? Heart disease Brother   ? Bipolar disorder Sister   ? Throat cancer Paternal Grandmother   ? ? ?ALLERGIES:  is allergic to acthar hp [corticotropin], codeine, and prednisone. ? ?MEDICATIONS:  ?Current Outpatient Medications  ?Medication Sig Dispense Refill  ? albuterol (VENTOLIN HFA) 108 (90 Base) MCG/ACT inhaler INHALE 1-2 PUFFS INTO THE LUNGS EVERY 6 (SIX) HOURS AS NEEDED FOR WHEEZING OR SHORTNESS OF  BREATH. USE SPARINGLY! 18 each 0  ? calcium carbonate (TUMS EX) 750 MG chewable tablet Chew 1 tablet by mouth daily as needed for heartburn.    ? cetirizine (ZYRTEC) 10 MG tablet Take 10 mg by mouth daily.    ? Doxylamine Succinate, Sleep, (SLEEP AID PO) Take 1 tablet by mouth at bedtime as needed (sleep).    ? feeding supplement, ENSURE ENLIVE, (ENSURE ENLIVE) LIQD Take 237 mLs by mouth 2 (two) times daily between meals.    ? fluticasone-salmeterol (ADVAIR DISKUS) 250-50 MCG/ACT AEPB INHALE 1 PUFF INTO THE LUNGS TWICE A DAY 60 each 5  ? guaiFENesin (MUCINEX) 600 MG 12 hr tablet Take 600 mg by mouth 2 (two) times daily as needed for cough.    ? metoprolol succinate (TOPROL-XL) 25 MG 24 hr tablet Take 1 tablet (25 mg total) by mouth daily. Take with or immediately following a meal. 90 tablet 3  ? Multiple Vitamin (MULTIVITAMIN WITH MINERALS) TABS tablet Take 1 tablet by mouth daily.    ? omeprazole (PRILOSEC) 40 MG capsule Take 1 capsule (40 mg total) by mouth daily. 90 capsule 3  ? Probiotic Product (PROBIOTIC ADVANCED) CAPS Take 1 capsule  by mouth daily.    ? torsemide (DEMADEX) 20 MG tablet Take for weight gain of 2 lbs in 1 day or 5 lbs in 1 week or obvious swelling. 30 tablet 11  ? trolamine salicylate (ASPERCREME) 10 % cream Apply 1 a

## 2022-04-17 ENCOUNTER — Encounter: Payer: Self-pay | Admitting: Nurse Practitioner

## 2022-04-17 ENCOUNTER — Other Ambulatory Visit: Payer: Medicare Other | Admitting: Nurse Practitioner

## 2022-04-17 DIAGNOSIS — M79642 Pain in left hand: Secondary | ICD-10-CM | POA: Diagnosis not present

## 2022-04-17 DIAGNOSIS — R531 Weakness: Secondary | ICD-10-CM | POA: Diagnosis not present

## 2022-04-17 DIAGNOSIS — M79641 Pain in right hand: Secondary | ICD-10-CM

## 2022-04-17 DIAGNOSIS — Z515 Encounter for palliative care: Secondary | ICD-10-CM

## 2022-04-17 DIAGNOSIS — H01006 Unspecified blepharitis left eye, unspecified eyelid: Secondary | ICD-10-CM | POA: Diagnosis not present

## 2022-04-17 NOTE — Progress Notes (Signed)
? ? ?Manufacturing engineer ?Community Palliative Care Consult Note ?Telephone: [phone]  ?Fax: [phone]  ? ? ?Date of encounter: 04/17/22 ?1:16 PM ?PATIENT NAME: Anita [name] ?[place] [address]   ?[phone] (home)  ?DOB: [birth year] ?MRN: [id number] ?PRIMARY CARE PROVIDER:    ?Anita Koch, NP,  ?[address] ?[place] [address] ?[phone] ?RESPONSIBLE PARTY:    ?Contact Information   ? ? Name Relation Home Work Mobile  ? [name],Anita Daughter [phone]    ? ?  ? ?I met face to face with patient in home/facility. Palliative Care was asked to follow this patient by consultation request of  Anita Koch, NP to address advance care planning and complex medical decision making. This is a follow up visit.                                  ?ASSESSMENT AND PLAN / RECOMMENDATIONS:  ?Symptom Management/Plan: ?1. Advance Care Planning; Full code, will continue to discussions ?  ?2. Bilateral hand pain secondary to arthritis. Discussed exercises; also OTC [place] as needed. Continue current pain management ?  ?3. Generalized weakness secondary to MS, continue fall precautions; use walker, discussed fall risk. Continue to encourage self independence as able, we talked about renting a ramp. Praised Anita [name] for continuing to use her walker, no recent falls.  ?  ?4. Left eye blephitis, will send in erythromycin ointment, discussed warm compresses, will have PC RN recheck in 2 weeks or sooner if worsens ?Rx: Erythromycin ointment; apply bid x 10 days; No RF, escribed to pharmacy ?  ?5. Palliative care encounter; Palliative care encounter; Palliative medicine team will continue to support patient, patient's family, and medical team. Visit consisted of counseling and education dealing with the complex and emotionally intense issues of symptom management and palliative care in the setting of serious and potentially life-threatening  illness ? ?Follow up Palliative Care Visit: Palliative care will continue to follow for complex medical decision making, advance care planning, and clarification of goals. Return 2 weeks for RN PC then for [facility] NP 8 weeks or prn. ? ?I spent 46 minutes providing this consultation. More than 50% of the time in this consultation was spent in counseling and care coordination. ?PPS: 40% ? ?Chief Complaint: Folow up palliative consult for complex medical decision making ? ?HISTORY OF PRESENT ILLNESS:  Anita [name] is a 61 y.o. year old female  with multiple medical problems including hx of MS, CAD, severe MR with moderate mitral stenosis, COPD, tobacco use, systolic and diastolic heart failure. I called Anita [name] daughter Anita [name] to confirm f/u in person pc visit with covid negative. We talked about how she has been feeling. Anita [name] endorses motivation is challenging. She would rather be sedentary rather than more active. We talked about stimulation, exhaustion, energy levels in the setting of CHF, CAD, MS. Medical goals reviewed. I visited Anita [name] in her home, she was lying in her bed. Anita [name] endorses she is very tired, as she feels it is more difficulty with mobility, tasks take her longer and exhausting with increase in shortness of breath. Continues lasix with BSC. She does continue to ambulate with her walker, no recent falls. We talked about fall risk. We talked about sleep which has improved recently with sleep patterns, hygiene, chronic. We talked about ros, symptoms. We talked about progressive MS including more difficulty with speech,  slower and more difficulty processing thoughts. We talked about appetite, which has been decreased. We talked about foods she likes. We talked about supplements. We talked about bilateral hand pain due to OA, as well as blepharitis left eye with rx erythromycin. We talked about medical goals. We talked about role pc. We talked about f/u pc visit, [name] in agreement, scheduled. We talked about progression of MS, expectations, impact on her chronic disease progression. We talked about functional and cognitive changes. We talked about coping strategies. Anita [name] endorses her brother is also a support person in her life, she believes since he has health problems himself he understands what she has been going through. We talked about quality of life. We talked about f/u pc visit, Anita [name] in agreement, scheduled. Therapeutic listening, emotional support provided. We talked about quality of life. Questions answered.  ?  ?History obtained from review of EMR, discussion with Anita [name] in person, discussed with daughter Anita [name] by phone ?I reviewed available labs, medications, imaging, studies and related documents from the EMR.  Records reviewed and summarized above.  ?  ?ROS ?10 point system reviewed all negative except HPI ?  ?Physical Exam: ?Constitutional: NAD ?General: frail appearing, chronically ill, pleasant female ?EYES: left eye upper and lower lids erythematous, swollen; blephitis ?ENMT: oral mucous membranes moist ?CV: S1S2, RRR, no LE edema ?Pulmonary: LCTA, no increased work of breathing, no cough, room air ?Abdomen:  soft and non tender ?MSK: slow, unsteady gait using a rolaid walker ?Skin: warm and dry ?Neuro:  + generalized weakness,  + cognitive impairment, +slow speech ?Psych: non-anxious affect, A and O x 3,  ?Thank you for the opportunity to participate in the care of Anita [name].  The palliative care team will continue to follow. Please call our office at [phone] if we can be of additional assistance.  ? ?Anita [name] Anita [name] Anita Emmerich, NP  ? ?COVID-19 PATIENT SCREENING TOOL ?Asked and negative response unless otherwise noted:  ? ?Have you had symptoms of covid, tested positive or been in contact with someone with symptoms/positive test in the past 5-10 days? NO  ?

## 2022-04-29 ENCOUNTER — Telehealth: Payer: Self-pay

## 2022-04-29 NOTE — Telephone Encounter (Signed)
3 pm.  Phone call made to daughter Cindi to follow up patient's left eye at the request of Christin Gusler, NP.  No answer, message left requesting a call back.

## 2022-06-05 NOTE — Progress Notes (Addendum)
NEUROLOGY FOLLOW UP OFFICE NOTE  Anita [name] [id number]  Assessment/Plan:   1  Multiple sclerosis - inactive secondary progressive 2  Migraine without aura, without status migrainosus, not intractable 3  Insomnia   As it will not change management (for we will not be starting DMT), we can hold off on the MRI Consider melatonin at bedtime and avoid naps to help  Follow up in 12 months.     Subjective:  Anita [name] is a 61 year old right-handed female with CAD, prolonged QT, severe mitral regurg, CHF and arthritis who follows up for multiple sclerosis.  She is accompanied by her daughter who provides further history.  UPDATE: Unable to get MRI because she had trouble breathing when flat on the table.    Vision:  Vision is blurred.  has not had an eye exam since last visit.   Motor:  generalized weakness Sensory:  numbness and tingling in the right hand.  However may feel tingling and numbness in various extremities Pain:  Left lateral burning thigh pain Gait:  Requires walker.  Outside home, she uses wheelchair Bowel/Bladder:  Partial bowel and bladder incontinence Fatigue:  yes Cognition:  Mild cognitive impairment Mood:  okay Insomnia.  Naps off and on throughout the day.  Tried Benadryl which doesn't help.  Melatonin was ineffective.  Cannot take much due to cardiac comorbidities. Appetite is improved.  5 small meals a day.   Migraines:  Infrequent. Stopped sumatriptan due to CAD.  HISTORY:  Diagnosed with multiple sclerosis in May 2000 presenting as optic neuritis (right and then left).  For several years before then, she noted occasional paresthesias.  For several years (early 2001 to 2010) she declined being on DMT.  During that period, she clinically did well.  Occasionally she had balance problems and may have had another episode of optic neuritis.  She was on Tysabri in 2010 until about 2012.  Subsequently stopped due to positive JC Virus positive.   She reportedly had significant decline in 2014 with worsening gait in which she resorted to using a walker, mild cognitive impairment and occasional fecal incontinence.  She thought it was just another flare up but symptoms never resolved.     She has had a decline since 2022.  She has multiple co-morbidities such as CHF and CAD.  She was in the hospital on at least 2 occasions over the past year with prolong rehab in which she was pretty much sedentary.       She has significant medical co-morbidities including COPD, rheumatic mitral regurgitation, CAD, orthostatic hypotension, combined systolic and diastolic heart failure, and iron-deficiency anemia   Current DMT:  None Past DMT:  Avonex (side effects), Copaxone, Tysabri (JC antibody positive)   Current medications:  midodrine, torsemide, sumatriptan '25mg'$ , B12, ferrous sulfate, MVI Past medications:  H.P. Acthar Gel (throat swelling, hemoptysis), Solu-Medrol, mirtazapine    Imaging: 10/27/2003 MRI BRAIN W WO (personally reviewed):  increasing size and number of multiple intracranial lesions involving the periventricular subcortical white matter, consistent with progression of MS since previous study of April, 2002. 03/26/2001 MRI BRAIN W WO:  Small white matter lesions on the right are unchanged (compared to MRI from 04/05/99).  These lesions are suggestive but not diagnostic of multiple sclerosis.  Other causes of small vessel ischemic disease could also give this appearance, including diabetes, hypertension, vasculitis, or migraine headaches. 04/05/1999 MRI BRAIN W WO:  There are multiple small nonspecific foci of increased T2 prolongation involving the  cerebral white matter.  In this young patient with reported clinically suspected optic neuritis multiple sclerosis is a consideration.  Further considerations include causes for small vessel disease including diabetes, hypertension, vasculitis, collagen vascular disease, and migraines.   No know  family history of MS.    PAST MEDICAL HISTORY: Past Medical History:  Diagnosis Date   Abnormality of gait 07/26/2013   Acute respiratory failure with hypoxia ([place]) 03/20/2020   Allergy    Arthritis    Atypical pneumonia 03/22/2020   CAD (coronary artery disease)    a. 03/2020 Cath: LM nl, LAD 60/17m 55d, D1 70, D2 70, LCX 45ost/p, 65p, RCA 100ost CTO. RPDA fills via collats from dLAD. Inf septal fills via collats from 1st septal, RPAV fills via collats from LPAV, RPL1/2 sev dzs-->med rx.   Chickenpox    Chronic combined systolic (congestive) and diastolic (congestive) heart failure ([facility]    a. 02/2021 Echo: EF 40-45%, glob HK, gr2 DD. Sev red RV fxn, RVSP 69.260mg. Mod dil LA, sev dil RA. Sev MR. Mild to mod MS. Mean MV grad 6.35m74m. Mod-Sev TR. Mild AI. Mild Ao sclerosis w/o stenosis.   COPD (chronic obstructive pulmonary disease) (HCC)    Elevated troponin 03/22/2020   GERD (gastroesophageal reflux disease)    Headache(784.0)    Migraine   Hearing loss    Right ear secondary to infection   History of shingles    Ischemic cardiomyopathy    a. 02/2021 Echo: EF 40-45%, glob HK.   Migraines    Multiple sclerosis (HCC)    Obese    Optic neuritis    [facility] artery hypertension) ([facility]  a. 02/2021 Echo: RVSP 69.2mm335m   Prolonged QT interval 03/20/2020   Severe mitral regurgitation    a. 02/2021 Echo: Severe MR w/ mild to mod MS.  Mean MV grad 6.35mmH68m   MEDICATIONS: Current Outpatient Medications on File Prior to Visit  Medication Sig Dispense Refill   albuterol (VENTOLIN HFA) 108 (90 Base) MCG/ACT inhaler INHALE 1-2 PUFFS INTO THE LUNGS EVERY 6 (SIX) HOURS AS NEEDED FOR WHEEZING OR SHORTNESS OF BREATH. USE SPARINGLY! 18 each 0   calcium carbonate (TUMS EX) 750 MG chewable tablet Chew 1 tablet by mouth daily as needed for heartburn.     cetirizine (ZYRTEC) 10 MG tablet Take 10 mg by mouth daily.     Doxylamine Succinate, Sleep, (SLEEP AID PO) Take 1 tablet by mouth at  bedtime as needed (sleep).     feeding supplement, ENSURE ENLIVE, (ENSURE ENLIVE) LIQD Take 237 mLs by mouth 2 (two) times daily between meals.     ferrous sulfate 325 (65 FE) MG EC tablet Take 1 tablet (325 mg total) by mouth 2 (two) times daily with a meal. 60 tablet 3   fluticasone-salmeterol (ADVAIR DISKUS) 250-50 MCG/ACT AEPB INHALE 1 PUFF INTO THE LUNGS TWICE A DAY 60 each 5   guaiFENesin (MUCINEX) 600 MG 12 hr tablet Take 600 mg by mouth 2 (two) times daily as needed for cough.     metoprolol succinate (TOPROL-XL) 25 MG 24 hr tablet Take 1 tablet (25 mg total) by mouth daily. Take with or immediately following a meal. 90 tablet 3   Multiple Vitamin (MULTIVITAMIN WITH MINERALS) TABS tablet Take 1 tablet by mouth daily.     nitroGLYCERIN (NITROSTAT) 0.4 MG SL tablet Place 1 tablet (0.4 mg total) under the tongue every 5 (five) minutes as needed for chest pain. (Patient not taking: Reported on 04/09/2022) 20  tablet 5   omeprazole (PRILOSEC) 40 MG capsule Take 1 capsule (40 mg total) by mouth daily. 90 capsule 3   Probiotic Product (PROBIOTIC ADVANCED) CAPS Take 1 capsule by mouth daily.     torsemide (DEMADEX) 20 MG tablet Take for weight gain of 2 lbs in 1 day or 5 lbs in 1 week or obvious swelling. 30 tablet 11   trolamine salicylate (ASPERCREME) 10 % cream Apply 1 application. topically daily as needed for muscle pain (back or joint pain).     vitamin B-12 (CYANOCOBALAMIN) 1000 MCG tablet Take 1,000 mcg by mouth daily.     No current facility-administered medications on file prior to visit.    ALLERGIES: Allergies  Allergen Reactions   Acthar Hp [Corticotropin] Other (See Comments)    Throat swelling and coughing up blood   Codeine    Prednisone     FAMILY HISTORY: Family History  Problem Relation Age of Onset   Skin cancer Mother    Lung cancer Father    Uterine cancer Sister    Heart disease Brother    Bipolar disorder Sister    Throat cancer Paternal Grandmother        Objective:  Blood pressure 124/81, pulse 98, height '5\' 3"'$  (1.6 m), weight 130 lb (59 kg), SpO2 97 %. General: No acute distress.  Patient appears well-groomed.   Head:  Normocephalic/atraumatic Eyes:  Fundi examined but not visualized Neck: supple, no paraspinal tenderness, full range of motion Heart:  Regular rate and rhythm Neurological Exam: alert and oriented to person, place, and time.  Speech fluent and not dysarthric, language intact.  CN II-XII intact. Bulk decreased and tone increased, muscle strength 5-/5 throughout.  Sensation to light touch intact.  Deep tendon reflexes 2+ throughout, toes downgoing.  Finger to nose testing dysmetria bilateraly.  In wheelchair.  Gait testing deferred.   Metta Clines, DO  CC: Alma Friendly, NP

## 2022-06-06 ENCOUNTER — Encounter: Payer: Self-pay | Admitting: Neurology

## 2022-06-06 ENCOUNTER — Ambulatory Visit (INDEPENDENT_AMBULATORY_CARE_PROVIDER_SITE_OTHER): Payer: Medicare Other | Admitting: Neurology

## 2022-06-06 VITALS — BP 124/81 | HR 98 | Ht 63.0 in | Wt 130.0 lb

## 2022-06-06 DIAGNOSIS — G35 Multiple sclerosis: Secondary | ICD-10-CM | POA: Diagnosis not present

## 2022-06-06 DIAGNOSIS — G43009 Migraine without aura, not intractable, without status migrainosus: Secondary | ICD-10-CM | POA: Diagnosis not present

## 2022-06-06 DIAGNOSIS — I255 Ischemic cardiomyopathy: Secondary | ICD-10-CM | POA: Diagnosis not present

## 2022-06-23 ENCOUNTER — Encounter: Payer: Self-pay | Admitting: Neurology

## 2022-07-02 ENCOUNTER — Other Ambulatory Visit: Payer: Medicare Other | Admitting: Nurse Practitioner

## 2022-07-02 ENCOUNTER — Encounter: Payer: Self-pay | Admitting: Nurse Practitioner

## 2022-07-02 DIAGNOSIS — Z515 Encounter for palliative care: Secondary | ICD-10-CM

## 2022-07-02 DIAGNOSIS — G35 Multiple sclerosis: Secondary | ICD-10-CM

## 2022-07-02 DIAGNOSIS — K219 Gastro-esophageal reflux disease without esophagitis: Secondary | ICD-10-CM | POA: Diagnosis not present

## 2022-07-02 DIAGNOSIS — I509 Heart failure, unspecified: Secondary | ICD-10-CM

## 2022-07-02 DIAGNOSIS — R531 Weakness: Secondary | ICD-10-CM

## 2022-07-02 DIAGNOSIS — H01006 Unspecified blepharitis left eye, unspecified eyelid: Secondary | ICD-10-CM | POA: Diagnosis not present

## 2022-07-02 NOTE — Progress Notes (Signed)
[facility] Palliative Care Consult Note Telephone: [phone]  Fax: [phone]    Date of encounter: 07/02/22 2:31 PM PATIENT NAME: Anita [name] [address] [place] [address]   [phone] (home)  DOB: Nov 07, 1961 MRN: [id number] PRIMARY CARE PROVIDER:    [name], NP,  [address] [place] [address] [phone] RESPONSIBLE PARTY:    Contact Information     Name Relation Home Work Mobile   [place] Daughter [phone]        I met face to face with patient and family in home. Palliative Care was asked to follow this patient by consultation request of  Anita Koch, NP to address advance care planning and complex medical decision making. This is a follow up visit.                                  ASSESSMENT AND PLAN / RECOMMENDATIONS: 1. Advance Care Planning; Full code, will continue to discussions   2. Bilateral hand pain secondary to arthritis. Discussed exercises; also OTC Arnicare as needed. Continue current pain management   3. Generalized weakness secondary to MS, continue fall precautions; use walker, discussed fall risk. Continue to encourage self independence as able. Praised [name] for continuing to use her walker, no recent falls.    4. Left eye blephitis, will send in erythromycin ointment, discussed warm compresses, will have PC RN recheck in 2 weeks or sooner if worsens Rx: Erythromycin ointment; apply bid x 10 days; 1 RF, escribed to pharmacy  5. GERD, uncontrolled, out of omeprozole and tums does not completely relieve s/s. Discussed diet, will refill omeprazole as it controls s/s.  Rx Omeprazole 89m qd; #90; 1RF escribed to pharmacy   6. Palliative care encounter; Palliative care encounter; Palliative medicine team will continue to support patient, patient's family, and medical team. Visit consisted of counseling and education dealing with the complex and  emotionally intense issues of symptom management and palliative care in the setting of serious and potentially life-threatening illness   Follow up Palliative Care Visit: Palliative care will continue to follow for complex medical decision making, advance care planning, and clarification of goals. Return PC NP 8 weeks or prn.   I spent 61 minutes providing this consultation. More than 50% of the time in this consultation was spent in counseling and care coordination. PPS: 40%   Chief Complaint: Folow up palliative consult for complex medical decision making   HISTORY OF PRESENT ILLNESS:  PLIESEL PECKENPAUGHis a 61y.o. year old female  with multiple medical problems including hx of MS, CAD, severe MR with moderate mitral stenosis, COPD, tobacco use, systolic and diastolic heart failure. I called Anita [name] daughter CCaren Griffinsto confirm f/u in person pc visit with covid negative. CCarson MyrtleI talked about updates, recent cardiology, neurology visit no changes, appears stable. We talked about LTC planning, CCaren Griffinsendorses when [name] to a point where she is unable to toilet herself, transfer she will need facility placement. Offered PC SW consult but at this time CCaren Griffinswould like to wait since [name] stable, is able to perform these functions. I visited and observed Anita [name] sitting on the bed in her room, appears comfortable. We talked about how she has been feeling, ros, symptoms, functional changes continues to use rolaid walker, no recent falls. We talked about medications including  diuretic, weight with weight loss, nutrition. [name] requested omeprazole to be refilled for gerd s/s worsening as she ran out and has been using tums in place. We talked about nutrition, education, supplements. We talked about visit to neurology, cardiology. We talked about disease progression of CHF, [name] her daughter joined the conversation. We talked about left eye  appears blephitis, cleared from erythromycin ointment, now has reoccurred. Discussed will refill the erythromycin ointment should not clear or re-occur would recommend Opthomology visit. Anita [name] in agreement. We talked about medical goals, poc, f/u PC visit, scheduled. Counseled Anita [name] concerning difficulty residing and relying on others. We talked about quality of life. Coping strategies discussed. We talked about f/u pc visit, [name] in agreement, scheduled. Therapeutic listening, emotional support provided. Questions answered.    History obtained from review of EMR, discussion with Anita [name] in person, discussed with daughter Anita [name] by phone then joined visit I reviewed available labs, medications, imaging, studies and related documents from the EMR.  Records reviewed and summarized above.    ROS 10 point system reviewed all negative except HPI   Physical Exam: Constitutional: NAD General: frail appearing, chronically ill, pleasant female EYES: left eye upper and lower lids erythematous, swollen; blephitis ENMT: oral mucous membranes moist CV: S1S2, RRR, no LE edema Pulmonary: LCTA, no increased work of breathing, no cough, room air Abdomen:  soft and non tender MSK: slow, unsteady gait using a rolaid walker Skin: warm and dry Neuro:  + generalized weakness,  + cognitive impairment, +slow speech Psych: non-anxious affect, A and O x 3,  Thank you for the opportunity to participate in the care of Anita [name].  The palliative care team will continue to follow. Please call our office at [phone] if we can be of additional assistance.   Anita [name] Ihor Gully, NP

## 2022-07-08 ENCOUNTER — Ambulatory Visit (INDEPENDENT_AMBULATORY_CARE_PROVIDER_SITE_OTHER): Payer: Medicare Other | Admitting: *Deleted

## 2022-07-08 DIAGNOSIS — Z0000 Encounter for general adult medical examination without abnormal findings: Secondary | ICD-10-CM | POA: Diagnosis not present

## 2022-07-08 NOTE — Patient Instructions (Signed)
Anita [name] , Thank you for taking time to come for your Medicare Wellness Visit. I appreciate your ongoing commitment to your health goals. Please review the following plan we discussed and let me know if I can assist you in the future.   Screening recommendations/referrals: Colonoscopy: up to date Mammogram: Education provided Recommended yearly ophthalmology/optometry visit for glaucoma screening and checkup Recommended yearly dental visit for hygiene and checkup  Vaccinations: Influenza vaccine: Education provided Tdap vaccine: Education provided Shingles vaccine: Education provided      Preventive Care 40-64 Years and Older, Female Preventive care refers to lifestyle choices and visits with your health care provider that can promote health and wellness. What does preventive care include? A yearly physical exam. This is also called an annual well check. Dental exams once or twice a year. Routine eye exams. Ask your health care provider how often you should have your eyes checked. Personal lifestyle choices, including: Daily care of your teeth and gums. Regular physical activity. Eating a healthy diet. Avoiding tobacco and drug use. Limiting alcohol use. Practicing safe sex. Taking low-dose aspirin every day. Taking vitamin and mineral supplements as recommended by your health care provider. What happens during an annual well check? The services and screenings done by your health care provider during your annual well check will depend on your age, overall health, lifestyle risk factors, and family history of disease. Counseling  Your health care provider may ask you questions about your: Alcohol use. Tobacco use. Drug use. Emotional well-being. Home and relationship well-being. Sexual activity. Eating habits. History of falls. Memory and ability to understand (cognition). Work and work Statistician. Reproductive health. Screening  You may have the following tests  or measurements: Height, weight, and BMI. Blood pressure. Lipid and cholesterol levels. These may be checked every 5 years, or more frequently if you are over 30 years old. Skin check. Lung cancer screening. You may have this screening every year starting at age 75 if you have a 30-pack-year history of smoking and currently smoke or have quit within the past 15 years. Fecal occult blood test (FOBT) of the stool. You may have this test every year starting at age 26. Flexible sigmoidoscopy or colonoscopy. You may have a sigmoidoscopy every 5 years or a colonoscopy every 10 years starting at age 31. Hepatitis C blood test. Hepatitis B blood test. Sexually transmitted disease (STD) testing. Diabetes screening. This is done by checking your blood sugar (glucose) after you have not eaten for a while (fasting). You may have this done every 1-3 years. Bone density scan. This is done to screen for osteoporosis. You may have this done starting at age 6. Mammogram. This may be done every 1-2 years. Talk to your health care provider about how often you should have regular mammograms. Talk with your health care provider about your test results, treatment options, and if necessary, the need for more tests. Vaccines  Your health care provider may recommend certain vaccines, such as: Influenza vaccine. This is recommended every year. Tetanus, diphtheria, and acellular pertussis (Tdap, Td) vaccine. You may need a Td booster every 10 years. Zoster vaccine. You may need this after age 34. Pneumococcal 13-valent conjugate (PCV13) vaccine. One dose is recommended after age 29. Pneumococcal polysaccharide (PPSV23) vaccine. One dose is recommended after age 39. Talk to your health care provider about which screenings and vaccines you need and how often you need them. This information is not intended to replace advice given to you by your health care  provider. Make sure you discuss any questions you have with your  health care provider. Document Released: 12/21/2015 Document Revised: 08/13/2016 Document Reviewed: 09/25/2015 Elsevier Interactive Patient Education  2017 [employer] Prevention in the Home Falls can cause injuries. They can happen to people of all ages. There are many things you can do to make your home safe and to help prevent falls. What can I do on the outside of my home? Regularly fix the edges of walkways and driveways and fix any cracks. Remove anything that might make you trip as you walk through a door, such as a raised step or threshold. Trim any bushes or trees on the path to your home. Use bright outdoor lighting. Clear any walking paths of anything that might make someone trip, such as rocks or tools. Regularly check to see if handrails are loose or broken. Make sure that both sides of any steps have handrails. Any raised decks and porches should have guardrails on the edges. Have any leaves, snow, or ice cleared regularly. Use sand or salt on walking paths during winter. Clean up any spills in your garage right away. This includes oil or grease spills. What can I do in the bathroom? Use night lights. Install grab bars by the toilet and in the tub and shower. Do not use towel bars as grab bars. Use non-skid mats or decals in the tub or shower. If you need to sit down in the shower, use a plastic, non-slip stool. Keep the floor dry. Clean up any water that spills on the floor as soon as it happens. Remove soap buildup in the tub or shower regularly. Attach bath mats securely with double-sided non-slip rug tape. Do not have throw rugs and other things on the floor that can make you trip. What can I do in the bedroom? Use night lights. Make sure that you have a light by your bed that is easy to reach. Do not use any sheets or blankets that are too big for your bed. They should not hang down onto the floor. Have a firm chair that has side arms. You can use this for  support while you get dressed. Do not have throw rugs and other things on the floor that can make you trip. What can I do in the kitchen? Clean up any spills right away. Avoid walking on wet floors. Keep items that you use a lot in easy-to-reach places. If you need to reach something above you, use a strong step stool that has a grab bar. Keep electrical cords out of the way. Do not use floor polish or wax that makes floors slippery. If you must use wax, use non-skid floor wax. Do not have throw rugs and other things on the floor that can make you trip. What can I do with my stairs? Do not leave any items on the stairs. Make sure that there are handrails on both sides of the stairs and use them. Fix handrails that are broken or loose. Make sure that handrails are as long as the stairways. Check any carpeting to make sure that it is firmly attached to the stairs. Fix any carpet that is loose or worn. Avoid having throw rugs at the top or bottom of the stairs. If you do have throw rugs, attach them to the floor with carpet tape. Make sure that you have a light switch at the top of the stairs and the bottom of the stairs. If you do not have  them, ask someone to add them for you. What else can I do to help prevent falls? Wear shoes that: Do not have high heels. Have rubber bottoms. Are comfortable and fit you well. Are closed at the toe. Do not wear sandals. If you use a stepladder: Make sure that it is fully opened. Do not climb a closed stepladder. Make sure that both sides of the stepladder are locked into place. Ask someone to hold it for you, if possible. Clearly mark and make sure that you can see: Any grab bars or handrails. First and last steps. Where the edge of each step is. Use tools that help you move around (mobility aids) if they are needed. These include: Canes. Walkers. Scooters. Crutches. Turn on the lights when you go into a dark area. Replace any light bulbs as soon  as they burn out. Set up your furniture so you have a clear path. Avoid moving your furniture around. If any of your floors are uneven, fix them. If there are any pets around you, be aware of where they are. Review your medicines with your doctor. Some medicines can make you feel dizzy. This can increase your chance of falling. Ask your doctor what other things that you can do to help prevent falls. This information is not intended to replace advice given to you by your health care provider. Make sure you discuss any questions you have with your health care provider. Document Released: 09/20/2009 Document Revised: 05/01/2016 Document Reviewed: 12/29/2014 Elsevier Interactive Patient Education  2017 [employer].

## 2022-07-08 NOTE — Progress Notes (Signed)
Subjective:   Anita [name] is a 61 y.o. female who presents for an Initial Medicare Annual Wellness Visit.  I connected with  Felix Pacini on 07/08/22 by a telephone  daughter [name] assisted in visit enabled telemedicine application and verified that I am speaking with the correct person using two identifiers.   I discussed the limitations of evaluation and management by telemedicine. The patient expressed understanding and agreed to proceed.  Patient location: home  Provider location: Tele-Home-home    Review of Systems           Objective:    There were no vitals filed for this visit. There is no height or weight on file to calculate BMI.     06/06/2022    1:34 PM 04/09/2022    1:46 PM 10/23/2021    2:43 PM 07/29/2021    1:45 PM 06/05/2021   11:00 AM 06/04/2021   12:13 PM 05/19/2021    6:00 PM  Advanced Directives  Does Patient Have a Medical Advance Directive? Yes Yes Yes Yes Yes No No  Type of Paramedic of [place];Living will Living will;Healthcare Power of [place];Living will;Out of facility DNR (pink MOST or yellow form) Healthcare Power of [place]    Does patient want to make changes to medical advance directive?    No - Patient declined No - Patient declined    Would patient like information on creating a medical advance directive?       No - Patient declined    Current Medications (verified) Outpatient Encounter Medications as of 07/08/2022  Medication Sig   albuterol (VENTOLIN HFA) 108 (90 Base) MCG/ACT inhaler INHALE 1-2 PUFFS INTO THE LUNGS EVERY 6 (SIX) HOURS AS NEEDED FOR WHEEZING OR SHORTNESS OF BREATH. USE SPARINGLY!   calcium carbonate (TUMS EX) 750 MG chewable tablet Chew 1 tablet by mouth daily as needed for heartburn.   cetirizine (ZYRTEC) 10 MG tablet Take 10 mg by mouth daily.   Doxylamine Succinate, Sleep, (SLEEP AID PO) Take 1 tablet by mouth at  bedtime as needed (sleep).   feeding supplement, ENSURE ENLIVE, (ENSURE ENLIVE) LIQD Take 237 mLs by mouth 2 (two) times daily between meals.   ferrous sulfate 325 (65 FE) MG EC tablet Take 1 tablet (325 mg total) by mouth 2 (two) times daily with a meal.   fluticasone-salmeterol (ADVAIR DISKUS) 250-50 MCG/ACT AEPB INHALE 1 PUFF INTO THE LUNGS TWICE A DAY   guaiFENesin (MUCINEX) 600 MG 12 hr tablet Take 600 mg by mouth 2 (two) times daily as needed for cough.   metoprolol succinate (TOPROL-XL) 25 MG 24 hr tablet Take 1 tablet (25 mg total) by mouth daily. Take with or immediately following a meal.   Multiple Vitamin (MULTIVITAMIN WITH MINERALS) TABS tablet Take 1 tablet by mouth daily.   nitroGLYCERIN (NITROSTAT) 0.4 MG SL tablet Place 1 tablet (0.4 mg total) under the tongue every 5 (five) minutes as needed for chest pain.   omeprazole (PRILOSEC) 40 MG capsule Take 1 capsule (40 mg total) by mouth daily.   Probiotic Product (PROBIOTIC ADVANCED) CAPS Take 1 capsule by mouth daily.   torsemide (DEMADEX) 20 MG tablet Take for weight gain of 2 lbs in 1 day or 5 lbs in 1 week or obvious swelling.   trolamine salicylate (ASPERCREME) 10 % cream Apply 1 application. topically daily as needed for muscle pain (back or joint pain).   vitamin B-12 (CYANOCOBALAMIN) 1000 MCG  tablet Take 1,000 mcg by mouth daily.   No facility-administered encounter medications on file as of 07/08/2022.    Allergies (verified) Acthar hp [corticotropin], Codeine, and Prednisone   History: Past Medical History:  Diagnosis Date   Abnormality of gait 07/26/2013   Acute respiratory failure with hypoxia ([place]) 03/20/2020   Allergy    Arthritis    Atypical pneumonia 03/22/2020   CAD (coronary artery disease)    a. 03/2020 Cath: LM nl, LAD 60/22m 55d, D1 70, D2 70, LCX 45ost/p, 65p, RCA 100ost CTO. RPDA fills via collats from dLAD. Inf septal fills via collats from 1st septal, RPAV fills via collats from LPAV, RPL1/2 sev  dzs-->med rx.   Chickenpox    Chronic combined systolic (congestive) and diastolic (congestive) heart failure ([place]    a. 02/2021 Echo: EF 40-45%, glob HK, gr2 DD. Sev red RV fxn, RVSP 69.279mg. Mod dil LA, sev dil RA. Sev MR. Mild to mod MS. Mean MV grad 6.5m52m. Mod-Sev TR. Mild AI. Mild Ao sclerosis w/o stenosis.   COPD (chronic obstructive pulmonary disease) (HCC)    Elevated troponin 03/22/2020   GERD (gastroesophageal reflux disease)    Headache(784.0)    Migraine   Hearing loss    Right ear secondary to infection   History of shingles    Ischemic cardiomyopathy    a. 02/2021 Echo: EF 40-45%, glob HK.   Migraines    Multiple sclerosis (HCC)    Obese    Optic neuritis    PAH (pulmonary artery hypertension) [place]  a. 02/2021 Echo: RVSP 69.2mm65m   Prolonged QT interval 03/20/2020   Severe mitral regurgitation    a. 02/2021 Echo: Severe MR w/ mild to mod MS.  Mean MV grad 6.5mmH53m  Past Surgical History:  Procedure Laterality Date   COLONOSCOPY WITH PROPOFOL N/A 08/03/2020   Procedure: COLONOSCOPY WITH PROPOFOL;  Surgeon: Anna,[name]  [facility];  Service: Gastroenterology;  Laterality: N/A;   Ganglioneuroma     Resection   LEFT HEART CATH AND CORONARY ANGIOGRAPHY N/A 03/23/2020   Procedure: LEFT HEART CATH AND CORONARY ANGIOGRAPHY;  Surgeon: HardiLeonie Man  [facility];  Service: Cardiovascular;  Laterality: N/A;   PILONIDAL CYST EXCISION     PILONIDAL CYST EXCISION  1983   TUMOR REMOVAL  2007/2008   Family History  Problem Relation Age of Onset   Skin cancer Mother    Lung cancer Father    Uterine cancer Sister    Heart disease Brother    Bipolar disorder Sister    Throat cancer Paternal Grandmother    Social History   Socioeconomic History   Marital status: Divorced    Spouse name: Not on file   Number of children: 2   Years of education: Not on file   Highest education level: Not on file  Occupational History   Not  on file  Tobacco Use   Smoking status: Former    Packs/day: 1.00    Types: Cigarettes   Smokeless tobacco: Never  Vaping Use   Vaping Use: Never used  Substance and Sexual Activity   Alcohol use: Yes    Comment: Occasional   Drug use: Not on file   Sexual activity: Not on file  Other Topics Concern   Not on file  Social History Narrative   Right handed    Lives in one story home   Drinks Caffeine - Sweet Tea    Social Determinants of  Health   Financial Resource Strain: Not on file  Food Insecurity: Not on file  Transportation Needs: Not on file  Physical Activity: Not on file  Stress: Not on file  Social Connections: Not on file    Tobacco Counseling Counseling given: Not Answered   Clinical Intake:                 Diabetic? no         Activities of Daily Living    10/22/2021   12:36 PM 08/02/2021    1:08 PM  In your present state of health, do you have any difficulty performing the following activities:  Hearing? 0 1  Vision? 0 0  Difficulty concentrating or making decisions? 0 0  Walking or climbing stairs? 0 0  Dressing or bathing? 0 0  Doing errands, shopping? 0 0    Patient Care Team: [name], NP as PCP - General (Internal Medicine) Buford Dresser, MD as PCP - Cardiology (Cardiology) Earlie Server, MD as Consulting Physician (Hematology and Oncology) Pieter Partridge, DO as Consulting Physician (Neurology)  Indicate any recent Medical Services you may have received from other than [facility] providers in the past year (date may be approximate).     Assessment:   This is a routine wellness examination for Jazmaine.  Hearing/Vision screen No results found.  Dietary issues and exercise activities discussed:     Goals Addressed   None    Depression Screen    06/18/2021    1:54 PM  PHQ 2/9 Scores  PHQ - 2 Score 0    Fall Risk    06/06/2022    1:33 PM 10/23/2021    2:43 PM 10/22/2021   12:36 PM 08/02/2021    1:08 PM  06/18/2021    1:55 PM  [place] in the past year? 0 0 0 0 0  Number falls in past yr: 0 0 0 0 0  Injury with Fall? 0 0 0 0 0  Risk for fall due to :   Impaired balance/gait  Impaired balance/gait  Follow up   Falls evaluation completed Falls evaluation completed Falls evaluation completed;Falls prevention discussed    FALL RISK PREVENTION PERTAINING TO THE HOME:  Any stairs in or around the home? Yes  If so, are there any without handrails? No  Home free of loose throw rugs in walkways, pet beds, electrical cords, etc? Yes  Adequate lighting in your home to reduce risk of falls? Yes   ASSISTIVE DEVICES UTILIZED TO PREVENT FALLS:  Life alert? No  Use of a cane, walker or w/c? Yes  Grab bars in the bathroom? Yes  Shower chair or bench in shower? Yes  Elevated toilet seat or a handicapped toilet? Yes   TIMED UP AND GO:  Was the test performed? No .    Cognitive Function:        Immunizations  There is no immunization history on file for this patient.  TDAP status: Due, Education has been provided regarding the importance of this vaccine. Advised may receive this vaccine at local pharmacy or Health Dept. Aware to provide a copy of the vaccination record if obtained from local pharmacy or Health Dept. Verbalized acceptance and understanding.  Flu Vaccine status: Due, Education has been provided regarding the importance of this vaccine. Advised may receive this vaccine at local pharmacy or Health Dept. Aware to provide a copy of the vaccination record if obtained from local pharmacy or Health Dept.  Verbalized acceptance and understanding.  Pneumococcal vaccine status: Due, Education has been provided regarding the importance of this vaccine. Advised may receive this vaccine at local pharmacy or Health Dept. Aware to provide a copy of the vaccination record if obtained from local pharmacy or Health Dept. Verbalized acceptance and understanding.  Covid-19 vaccine status:  Information provided on how to obtain vaccines.   Qualifies for Shingles Vaccine? Yes   Zostavax completed No   Shingrix Completed?: No.    Education has been provided regarding the importance of this vaccine. Patient has been advised to call insurance company to determine out of pocket expense if they have not yet received this vaccine. Advised may also receive vaccine at local pharmacy or Health Dept. Verbalized acceptance and understanding.  Screening Tests Health Maintenance  Topic Date Due   TETANUS/TDAP  Never done   Zoster Vaccines- Shingrix (1 of 2) Never done   MAMMOGRAM  02/18/2013   PAP SMEAR-Modifier  03/19/2021   INFLUENZA VACCINE  08/14/2024 (Originally 07/08/2022)   COLONOSCOPY (Pts 45-89yr Insurance coverage will need to be confirmed)  08/03/2025   Hepatitis C Screening  Completed   HIV Screening  Completed   HPV VACCINES  Aged Out   COVID-19 Vaccine  Discontinued    Health Maintenance  Health Maintenance Due  Topic Date Due   TETANUS/TDAP  Never done   Zoster Vaccines- Shingrix (1 of 2) Never done   MAMMOGRAM  02/18/2013   PAP SMEAR-Modifier  03/19/2021    Colorectal cancer screening: Type of screening: Colonoscopy. Completed 2021. Repeat every 5 years  Mammogram- patient was unable to schedule mammo last year due to office telling them without records they couldn't book.    Lung Cancer Screening: (Low Dose CT Chest recommended if Age 59-80years, 30 pack-year currently smoking OR have quit w/in 15years.) does not qualify.   Lung Cancer Screening Referral:   Additional Screening:  Hepatitis C Screening: does not qualify; Completed 2022  Vision Screening: Recommended annual ophthalmology exams for early detection of glaucoma and other disorders of the eye. Is the patient up to date with their annual eye exam?  No  Who is the provider or what is the name of the office in which the patient attends annual eye exams? [facility] If pt is not established  with a provider, would they like to be referred to a provider to establish care? No .   Dental Screening: Recommended annual dental exams for proper oral hygiene  Community Resource Referral / Chronic Care Management: CRR required this visit?  No   CCM required this visit?  No      Plan:     I have personally reviewed and noted the following in the patient's chart:   Medical and social history Use of alcohol, tobacco or illicit drugs  Current medications and supplements including opioid prescriptions. Patient is not currently taking opioid prescriptions. Functional ability and status Nutritional status Physical activity Advanced directives List of other physicians Hospitalizations, surgeries, and ER visits in previous 12 months Vitals Screenings to include cognitive, depression, and falls Referrals and appointments  In addition, I have reviewed and discussed with patient certain preventive protocols, quality metrics, and best practice recommendations. A written personalized care plan for preventive services as well as general preventive health recommendations were provided to patient.     JLeroy Kennedy LPN   [id number]  Nurse Notes:   Patients daughter is going to send a message my Chart , about Mammogram and  Ultrasound that was ordered last year.  States the office would not schedule do to not being able to get records from previous mammogram.

## 2022-08-21 ENCOUNTER — Inpatient Hospital Stay
Admission: EM | Admit: 2022-08-21 | Discharge: 2022-09-01 | DRG: 291 | Disposition: A | Discharge status: Home Health | Payer: Medicare Other | Attending: Osteopathic Medicine | Admitting: Osteopathic Medicine

## 2022-08-21 ENCOUNTER — Encounter: Payer: Self-pay | Admitting: Nurse Practitioner

## 2022-08-21 ENCOUNTER — Telehealth: Payer: Medicare Other | Admitting: Nurse Practitioner

## 2022-08-21 ENCOUNTER — Emergency Department: Payer: Medicare Other

## 2022-08-21 ENCOUNTER — Other Ambulatory Visit: Payer: Self-pay

## 2022-08-21 DIAGNOSIS — Z720 Tobacco use: Secondary | ICD-10-CM | POA: Diagnosis not present

## 2022-08-21 DIAGNOSIS — R1900 Intra-abdominal and pelvic swelling, mass and lump, unspecified site: Secondary | ICD-10-CM | POA: Diagnosis not present

## 2022-08-21 DIAGNOSIS — E860 Dehydration: Secondary | ICD-10-CM | POA: Diagnosis present

## 2022-08-21 DIAGNOSIS — N6459 Other signs and symptoms in breast: Secondary | ICD-10-CM | POA: Diagnosis present

## 2022-08-21 DIAGNOSIS — N1831 Chronic kidney disease, stage 3a: Secondary | ICD-10-CM | POA: Diagnosis not present

## 2022-08-21 DIAGNOSIS — R140 Abdominal distension (gaseous): Secondary | ICD-10-CM | POA: Diagnosis not present

## 2022-08-21 DIAGNOSIS — G35 Multiple sclerosis: Secondary | ICD-10-CM | POA: Diagnosis present

## 2022-08-21 DIAGNOSIS — I255 Ischemic cardiomyopathy: Secondary | ICD-10-CM | POA: Diagnosis present

## 2022-08-21 DIAGNOSIS — I5043 Acute on chronic combined systolic (congestive) and diastolic (congestive) heart failure: Secondary | ICD-10-CM | POA: Diagnosis not present

## 2022-08-21 DIAGNOSIS — R1909 Other intra-abdominal and pelvic swelling, mass and lump: Secondary | ICD-10-CM | POA: Diagnosis not present

## 2022-08-21 DIAGNOSIS — R079 Chest pain, unspecified: Secondary | ICD-10-CM | POA: Diagnosis not present

## 2022-08-21 DIAGNOSIS — T501X5A Adverse effect of loop [high-ceiling] diuretics, initial encounter: Secondary | ICD-10-CM | POA: Diagnosis not present

## 2022-08-21 DIAGNOSIS — E876 Hypokalemia: Secondary | ICD-10-CM | POA: Diagnosis not present

## 2022-08-21 DIAGNOSIS — R112 Nausea with vomiting, unspecified: Secondary | ICD-10-CM | POA: Diagnosis not present

## 2022-08-21 DIAGNOSIS — Z888 Allergy status to other drugs, medicaments and biological substances status: Secondary | ICD-10-CM

## 2022-08-21 DIAGNOSIS — E871 Hypo-osmolality and hyponatremia: Secondary | ICD-10-CM | POA: Diagnosis not present

## 2022-08-21 DIAGNOSIS — Z8249 Family history of ischemic heart disease and other diseases of the circulatory system: Secondary | ICD-10-CM

## 2022-08-21 DIAGNOSIS — Z20822 Contact with and (suspected) exposure to covid-19: Secondary | ICD-10-CM | POA: Diagnosis present

## 2022-08-21 DIAGNOSIS — I1310 Hypertensive heart and chronic kidney disease without heart failure, with stage 1 through stage 4 chronic kidney disease, or unspecified chronic kidney disease: Secondary | ICD-10-CM

## 2022-08-21 DIAGNOSIS — R609 Edema, unspecified: Secondary | ICD-10-CM | POA: Diagnosis not present

## 2022-08-21 DIAGNOSIS — J432 Centrilobular emphysema: Secondary | ICD-10-CM | POA: Diagnosis not present

## 2022-08-21 DIAGNOSIS — R0609 Other forms of dyspnea: Secondary | ICD-10-CM | POA: Diagnosis not present

## 2022-08-21 DIAGNOSIS — K219 Gastro-esophageal reflux disease without esophagitis: Secondary | ICD-10-CM | POA: Diagnosis present

## 2022-08-21 DIAGNOSIS — R7401 Elevation of levels of liver transaminase levels: Secondary | ICD-10-CM | POA: Diagnosis not present

## 2022-08-21 DIAGNOSIS — Z87891 Personal history of nicotine dependence: Secondary | ICD-10-CM

## 2022-08-21 DIAGNOSIS — I5082 Biventricular heart failure: Secondary | ICD-10-CM | POA: Diagnosis present

## 2022-08-21 DIAGNOSIS — E8770 Fluid overload, unspecified: Secondary | ICD-10-CM | POA: Diagnosis not present

## 2022-08-21 DIAGNOSIS — I272 Pulmonary hypertension, unspecified: Secondary | ICD-10-CM | POA: Diagnosis present

## 2022-08-21 DIAGNOSIS — D3610 Benign neoplasm of peripheral nerves and autonomic nervous system, unspecified: Secondary | ICD-10-CM | POA: Diagnosis present

## 2022-08-21 DIAGNOSIS — Z818 Family history of other mental and behavioral disorders: Secondary | ICD-10-CM

## 2022-08-21 DIAGNOSIS — Z515 Encounter for palliative care: Secondary | ICD-10-CM

## 2022-08-21 DIAGNOSIS — I081 Rheumatic disorders of both mitral and tricuspid valves: Secondary | ICD-10-CM | POA: Diagnosis present

## 2022-08-21 DIAGNOSIS — N1832 Chronic kidney disease, stage 3b: Secondary | ICD-10-CM | POA: Diagnosis present

## 2022-08-21 DIAGNOSIS — K761 Chronic passive congestion of liver: Secondary | ICD-10-CM | POA: Diagnosis present

## 2022-08-21 DIAGNOSIS — Z885 Allergy status to narcotic agent status: Secondary | ICD-10-CM

## 2022-08-21 DIAGNOSIS — E8721 Acute metabolic acidosis: Secondary | ICD-10-CM | POA: Diagnosis not present

## 2022-08-21 DIAGNOSIS — J449 Chronic obstructive pulmonary disease, unspecified: Secondary | ICD-10-CM | POA: Diagnosis present

## 2022-08-21 DIAGNOSIS — R234 Changes in skin texture: Secondary | ICD-10-CM | POA: Diagnosis present

## 2022-08-21 DIAGNOSIS — I248 Other forms of acute ischemic heart disease: Secondary | ICD-10-CM | POA: Diagnosis present

## 2022-08-21 DIAGNOSIS — Z79899 Other long term (current) drug therapy: Secondary | ICD-10-CM

## 2022-08-21 DIAGNOSIS — I2510 Atherosclerotic heart disease of native coronary artery without angina pectoris: Secondary | ICD-10-CM | POA: Diagnosis not present

## 2022-08-21 DIAGNOSIS — D509 Iron deficiency anemia, unspecified: Secondary | ICD-10-CM | POA: Diagnosis present

## 2022-08-21 DIAGNOSIS — D631 Anemia in chronic kidney disease: Secondary | ICD-10-CM | POA: Diagnosis present

## 2022-08-21 DIAGNOSIS — R600 Localized edema: Secondary | ICD-10-CM

## 2022-08-21 DIAGNOSIS — Z8049 Family history of malignant neoplasm of other genital organs: Secondary | ICD-10-CM

## 2022-08-21 DIAGNOSIS — M109 Gout, unspecified: Secondary | ICD-10-CM | POA: Diagnosis present

## 2022-08-21 DIAGNOSIS — I10 Essential (primary) hypertension: Secondary | ICD-10-CM | POA: Diagnosis not present

## 2022-08-21 DIAGNOSIS — I5023 Acute on chronic systolic (congestive) heart failure: Secondary | ICD-10-CM | POA: Diagnosis not present

## 2022-08-21 DIAGNOSIS — R778 Other specified abnormalities of plasma proteins: Secondary | ICD-10-CM | POA: Diagnosis not present

## 2022-08-21 DIAGNOSIS — D649 Anemia, unspecified: Secondary | ICD-10-CM | POA: Diagnosis not present

## 2022-08-21 DIAGNOSIS — N189 Chronic kidney disease, unspecified: Secondary | ICD-10-CM | POA: Diagnosis present

## 2022-08-21 DIAGNOSIS — R7989 Other specified abnormal findings of blood chemistry: Secondary | ICD-10-CM | POA: Diagnosis present

## 2022-08-21 DIAGNOSIS — Z808 Family history of malignant neoplasm of other organs or systems: Secondary | ICD-10-CM

## 2022-08-21 DIAGNOSIS — Z801 Family history of malignant neoplasm of trachea, bronchus and lung: Secondary | ICD-10-CM

## 2022-08-21 DIAGNOSIS — N179 Acute kidney failure, unspecified: Secondary | ICD-10-CM | POA: Diagnosis not present

## 2022-08-21 DIAGNOSIS — R188 Other ascites: Secondary | ICD-10-CM | POA: Diagnosis not present

## 2022-08-21 DIAGNOSIS — I130 Hypertensive heart and chronic kidney disease with heart failure and stage 1 through stage 4 chronic kidney disease, or unspecified chronic kidney disease: Principal | ICD-10-CM | POA: Diagnosis present

## 2022-08-21 DIAGNOSIS — R601 Generalized edema: Secondary | ICD-10-CM | POA: Diagnosis present

## 2022-08-21 DIAGNOSIS — R531 Weakness: Secondary | ICD-10-CM | POA: Diagnosis not present

## 2022-08-21 DIAGNOSIS — N2889 Other specified disorders of kidney and ureter: Secondary | ICD-10-CM | POA: Diagnosis not present

## 2022-08-21 DIAGNOSIS — R32 Unspecified urinary incontinence: Secondary | ICD-10-CM | POA: Diagnosis not present

## 2022-08-21 DIAGNOSIS — I2582 Chronic total occlusion of coronary artery: Secondary | ICD-10-CM | POA: Diagnosis present

## 2022-08-21 DIAGNOSIS — R0789 Other chest pain: Secondary | ICD-10-CM | POA: Diagnosis not present

## 2022-08-21 DIAGNOSIS — R110 Nausea: Secondary | ICD-10-CM | POA: Diagnosis not present

## 2022-08-21 DIAGNOSIS — I214 Non-ST elevation (NSTEMI) myocardial infarction: Secondary | ICD-10-CM | POA: Diagnosis present

## 2022-08-21 LAB — TROPONIN I (HIGH SENSITIVITY)
Troponin I (High Sensitivity): 601 ng/L (ref <18)
Troponin I (High Sensitivity): 521 ng/L (ref <18)

## 2022-08-21 LAB — CBC
HCT: 36 % (ref 36.0–46.0)
Hemoglobin: 10.6 g/dL — ABNORMAL LOW (ref 12.0–15.0)
MCH: 26.7 pg (ref 26.0–34.0)
MCHC: 29.4 g/dL — ABNORMAL LOW (ref 30.0–36.0)
MCV: 90.7 fL (ref 80.0–100.0)
Platelets: 227 10*3/uL (ref 150–400)
RBC: 3.97 MIL/uL (ref 3.87–5.11)
RDW: 17.8 % — ABNORMAL HIGH (ref 11.5–15.5)
WBC: 9.7 10*3/uL (ref 4.0–10.5)
nRBC: 0 % (ref 0.0–0.2)

## 2022-08-21 LAB — BASIC METABOLIC PANEL
Anion gap: 15 (ref 5–15)
BUN: 65 mg/dL — ABNORMAL HIGH (ref 6–20)
CO2: 17 mmol/L — ABNORMAL LOW (ref 22–32)
Calcium: 8 mg/dL — ABNORMAL LOW (ref 8.9–10.3)
Chloride: 107 mmol/L (ref 98–111)
Creatinine, Ser: 2.73 mg/dL — ABNORMAL HIGH (ref 0.44–1.00)
GFR, Estimated: 19 mL/min — ABNORMAL LOW (ref >60)
Glucose, Bld: 94 mg/dL (ref 70–99)
Potassium: 3.4 mmol/L — ABNORMAL LOW (ref 3.5–5.1)
Sodium: 139 mmol/L (ref 135–145)

## 2022-08-21 LAB — BRAIN NATRIURETIC PEPTIDE: B Natriuretic Peptide: >4500 pg/mL — ABNORMAL HIGH (ref 0.0–100.0)

## 2022-08-21 MED ORDER — ASPIRIN 81 MG PO CHEW
324.0000 mg | CHEWABLE_TABLET | Freq: Once | ORAL | Status: AC
Start: 2022-08-21 — End: 2022-08-21
  Administered 2022-08-21: 324 mg via ORAL
  Filled 2022-08-21: qty 4

## 2022-08-21 MED ORDER — FUROSEMIDE 10 MG/ML IJ SOLN
60.0000 mg | Freq: Once | INTRAMUSCULAR | Status: AC
  Administered 2022-08-21: 60 mg via INTRAVENOUS
  Filled 2022-08-21: qty 8

## 2022-08-21 NOTE — Assessment & Plan Note (Addendum)
Cardiology following.  Echocardiogram done 9/15 notes ejection fraction of 30 to 35% with severe tricuspid and mitral valve regurg.  Global hypokinesis.  Indeterminate diastolic function.  This looks to be worse than previous echocardiogram done 15 months ago.  After minimal diuresis, pt was placed on Lasix drip on 9/17 as per nephrology recommendations.  By 9/20, drip discontinued and patient started on oral Demadex.  To date, patient has diuresed over 8L and is almost -7.5 L deficient.  Has been on oral Demadex and by 9/24, felt to be fully diuresed and okay for discharge.   Please note that as per cardiology's evaluations, these are limited short-term interventions, as long-term, patient has limited options as she is not a can for valve repair/replacement and ARB/ACE inhibitor not felt to be indicated given her renal function but may be able to add this if renal function improves / BP allows.

## 2022-08-21 NOTE — ED Notes (Signed)
Reports abd pain has decreased after eating sandwich.

## 2022-08-21 NOTE — ED Notes (Signed)
Pt transferred to room 34

## 2022-08-21 NOTE — ED Triage Notes (Addendum)
PT coming [facility] from home for generalized swelling to abdomen. Belly tender and distended starting 2 weeks ago, facial swelling 4 days ago and N/V as well. Pt slurred speech and weakness is rt MS and is not a new symptom.  Chest pain intermittently for a few days, pt stating it is more of a soreness.

## 2022-08-21 NOTE — H&P (Signed)
History and Physical    Chief Complaint: Abdominal swelling.   HISTORY OF PRESENT ILLNESS: Anita [name] is an 61 y.o. female seen today  for abdominal swelling. H/o MS/ CHF. Cc; swelling  2 weeks of abd swelling. Pt has had  paracentesis in past. Elevated Lft. Pt has severe chf.  Swelling of face.  Pt has had anasarca. Intermittent chest pain - none reported to me.  Bnp 4500. TNI: 600- Tni 520. Lasix +. Pt may need paracentesis.  Kidney function is abnormal.  Ekg: NSR.no qt issues.  Cardiology consult: [name]- CHF clinic. [facility] [place] now- Dr.Christopher.   Per palliative care note earlier today patient has not taken diuretic therapy and has had swelling and edema over the abdomen In face over the past few days.  With reports of increasing weakness.  Patient last paracentesis was more than a year ago.   PAST MEDICAL HISTORY AS BELOW: Past Medical History:  Diagnosis Date  . Abnormality of gait 07/26/2013  . Acute respiratory failure with hypoxia ([place]) 03/20/2020  . Allergy   . Arthritis   . Atypical pneumonia 03/22/2020  . CAD (coronary artery disease)    a. 03/2020 Cath: LM nl, LAD 60/49m 55d, D1 70, D2 70, LCX 45ost/p, 65p, RCA 100ost CTO. RPDA fills via collats from dLAD. Inf septal fills via collats from 1st septal, RPAV fills via collats from LPAV, RPL1/2 sev dzs-->med rx.  . Chickenpox   . Chronic combined systolic (congestive) and diastolic (congestive) heart failure ([place]    a. 02/2021 Echo: EF 40-45%, glob HK, gr2 DD. Sev red RV fxn, RVSP 69.213mg. Mod dil LA, sev dil RA. Sev MR. Mild to mod MS. Mean MV grad 6.69m33m. Mod-Sev TR. Mild AI. Mild Ao sclerosis w/o stenosis.  . CMarland KitchenPD (chronic obstructive pulmonary disease) ([place] . Elevated troponin 03/22/2020  . GERD (gastroesophageal reflux disease)   . Headache(784.0)    Migraine  . Hearing loss    Right ear secondary to infection  . History of shingles   .  Ischemic cardiomyopathy    a. 02/2021 Echo: EF 40-45%, glob HK.  . Migraines   . Multiple sclerosis ([place] . Obese   . Optic neuritis   . PAH (pulmonary artery hypertension) ([place]  a. 02/2021 Echo: RVSP 69.2mm53m  . PrMarland Kitchenlonged QT interval 03/20/2020  . Severe mitral regurgitation    a. 02/2021 Echo: Severe MR w/ mild to mod MS.  Mean MV grad 6.69mmH28m    ALLERGIES: Allergies  Allergen Reactions  . Acthar Hp [Corticotropin] Other (See Comments)    Throat swelling and coughing up blood  . Codeine   . Prednisone     PAST SURGICAL HISTORY: Past Surgical History:  Procedure Laterality Date  . COLONOSCOPY WITH PROPOFOL N/A 08/03/2020   Procedure: COLONOSCOPY WITH PROPOFOL;  Surgeon: Anna,[name]  [facility];  Service: Gastroenterology;  Laterality: N/A;  . Ganglioneuroma     Resection  . LEFT HEART CATH AND CORONARY ANGIOGRAPHY N/A 03/23/2020   Procedure: LEFT HEART CATH AND CORONARY ANGIOGRAPHY;  Surgeon: HardiLeonie Man  [facility];  Service: Cardiovascular;  Laterality: N/A;  . PILONIDAL CYST EXCISION    . PILONIDAL CYST EXCISION  1983  . TUMOR REMOVAL  2007/2008     SOCIAL HISTORY: Social History   Socioeconomic History  . Marital status: Divorced    Spouse name: Not on file  . Number of  children: 2  . Years of education: Not on file  . Highest education level: Not on file  Occupational History  . Not on file  Tobacco Use  . Smoking status: Former    Packs/day: 1.00    Types: Cigarettes  . Smokeless tobacco: Never  Vaping Use  . Vaping Use: Never used  Substance and Sexual Activity  . Alcohol use: Yes    Comment: Occasional  . Drug use: Never  . Sexual activity: Not on file  Other Topics Concern  . Not on file  Social History Narrative   Right handed    Lives in one story home   Drinks Caffeine - Sweet Tea    Social Determinants of Health   Financial Resource Strain: Low Risk  (07/08/2022)   Overall Financial  Resource Strain (CARDIA)   . Difficulty of Paying Living Expenses: Not hard at all  Food Insecurity: No Food Insecurity (07/08/2022)   Hunger Vital Sign   . Worried About Charity fundraiser in the Last Year: Never true   . Ran Out of Food in the Last Year: Never true  Transportation Needs: No Transportation Needs (07/08/2022)   PRAPARE - Transportation   . Lack of Transportation (Medical): No   . Lack of Transportation (Non-Medical): No  Physical Activity: Inactive (07/08/2022)   Exercise Vital Sign   . Days of Exercise per Week: 0 days   . Minutes of Exercise per Session: 0 min  Stress: No Stress Concern Present (07/08/2022)   [place]   . Feeling of Stress : Not at all  Social Connections: Socially Isolated (07/08/2022)   Social Connection and Isolation Panel [NHANES]   . Frequency of Communication with Friends and Family: Three times a week   . Frequency of Social Gatherings with Friends and Family: Never   . Attends Religious Services: Never   . Active Member of Clubs or Organizations: No   . Attends Archivist Meetings: Never   . Marital Status: Divorced      CURRENT MEDS:     Current Outpatient Medications (Cardiovascular):  .  metoprolol succinate (TOPROL-XL) 25 MG 24 hr tablet, Take 1 tablet (25 mg total) by mouth daily. Take with or immediately following a meal. .  nitroGLYCERIN (NITROSTAT) 0.4 MG SL tablet, Place 1 tablet (0.4 mg total) under the tongue every 5 (five) minutes as needed for chest pain. Marland Kitchen  torsemide (DEMADEX) 20 MG tablet, Take for weight gain of 2 lbs in 1 day or 5 lbs in 1 week or obvious swelling.   Current Outpatient Medications (Respiratory):  .  albuterol (VENTOLIN HFA) 108 (90 Base) MCG/ACT inhaler, INHALE 1-2 PUFFS INTO THE LUNGS EVERY 6 (SIX) HOURS AS NEEDED FOR WHEEZING OR SHORTNESS OF BREATH. USE SPARINGLY! Marland Kitchen  cetirizine (ZYRTEC) 10 MG tablet, Take 10 mg by mouth daily. .   fluticasone-salmeterol (ADVAIR DISKUS) 250-50 MCG/ACT AEPB, INHALE 1 PUFF INTO THE LUNGS TWICE A DAY .  guaiFENesin (MUCINEX) 600 MG 12 hr tablet, Take 600 mg by mouth 2 (two) times daily as needed for cough.     Current Outpatient Medications (Hematological):  .  ferrous sulfate 325 (65 FE) MG EC tablet, Take 1 tablet (325 mg total) by mouth 2 (two) times daily with a meal. .  vitamin B-12 (CYANOCOBALAMIN) 1000 MCG tablet, Take 1,000 mcg by mouth daily.   Current Outpatient Medications (Other):  .  calcium carbonate (TUMS EX) 750 MG chewable tablet,  Chew 1 tablet by mouth daily as needed for heartburn. .  Doxylamine Succinate, Sleep, (SLEEP AID PO), Take 1 tablet by mouth at bedtime as needed (sleep). .  feeding supplement, ENSURE ENLIVE, (ENSURE ENLIVE) LIQD, Take 237 mLs by mouth 2 (two) times daily between meals. .  Multiple Vitamin (MULTIVITAMIN WITH MINERALS) TABS tablet, Take 1 tablet by mouth daily. Marland Kitchen  omeprazole (PRILOSEC) 40 MG capsule, Take 1 capsule (40 mg total) by mouth daily. .  Probiotic Product (PROBIOTIC ADVANCED) CAPS, Take 1 capsule by mouth daily. Marland Kitchen  trolamine salicylate (ASPERCREME) 10 % cream, Apply 1 application. topically daily as needed for muscle pain (back or joint pain). No current facility-administered medications for this encounter.       Review Of Systems: Review of Systems  Gastrointestinal:        Abdominal swelling   All other systems reviewed and are negative.  ED Course: > Vitals:   08/21/22 2000 08/21/22 2030 08/21/22 2100 08/21/22 2130  BP: 134/89 108/79 (!) 140/103 (!) 125/106  Pulse: 85 81 94 73  Resp: '19 19 19 19  '$ Temp:    98 F (36.7 C)  TempSrc:      SpO2: 100% 99% 98% 96%  Weight:       > Vitals:   08/21/22 1656 08/21/22 1817 08/21/22 1830 08/21/22 1900  BP: 117/89 (!) 134/100 (!) 129/98 (!) 120/95   08/21/22 1930 08/21/22 2000 08/21/22 2030 08/21/22 2100  BP: 118/87 134/89 108/79 (!) 140/103   08/21/22 2130  BP: (!)  125/106    >No intake/output data recorded. >No intake/output data recorded. >SpO2: 96 %  In ed pt is alert,and ill appearing.  Overall mild hypokalemia/ AG MA and AKI on CKD. Anemia with hb of 10.6 and is chronic.  In the emergency room patient noted to have an elevated BNP along with a troponin and was given aspirin At home with a single dose of Lasix. EKG today shows low voltage QRS:   Previous EKG 6/22 shows:   Results for orders placed or performed during the hospital encounter of 08/21/22 (from the past 24 hour(s))  Basic metabolic panel     Status: Abnormal   Collection Time: 08/21/22  5:05 PM  Result Value Ref Range   Sodium 139 135 - 145 mmol/L   Potassium 3.4 (L) 3.5 - 5.1 mmol/L   Chloride 107 98 - 111 mmol/L   CO2 17 (L) 22 - 32 mmol/L   Glucose, Bld 94 70 - 99 mg/dL   BUN 65 (H) 6 - 20 mg/dL   Creatinine, Ser 2.73 (H) 0.44 - 1.00 mg/dL   Calcium 8.0 (L) 8.9 - 10.3 mg/dL   GFR, Estimated 19 (L) >60 mL/min   Anion gap 15 5 - 15  CBC     Status: Abnormal   Collection Time: 08/21/22  5:05 PM  Result Value Ref Range   WBC 9.7 4.0 - 10.5 K/uL   RBC 3.97 3.87 - 5.11 MIL/uL   Hemoglobin 10.6 (L) 12.0 - 15.0 g/dL   HCT 36.0 36.0 - 46.0 %   MCV 90.7 80.0 - 100.0 fL   MCH 26.7 26.0 - 34.0 pg   MCHC 29.4 (L) 30.0 - 36.0 g/dL   RDW 17.8 (H) 11.5 - 15.5 %   Platelets 227 150 - 400 K/uL   nRBC 0.0 0.0 - 0.2 %  Troponin I (High Sensitivity)     Status: Abnormal   Collection Time: 08/21/22  5:05 PM  Result Value Ref  Range   Troponin I (High Sensitivity) 601 (HH) <18 ng/L  Brain natriuretic peptide     Status: Abnormal   Collection Time: 08/21/22  5:05 PM  Result Value Ref Range   B Natriuretic Peptide >4,500.0 (H) 0.0 - 100.0 pg/mL  Troponin I (High Sensitivity)     Status: Abnormal   Collection Time: 08/21/22  6:40 PM  Result Value Ref Range   Troponin I (High Sensitivity) 521 (HH) <18 ng/L    Admission Imaging : DG Chest 2 View  Result Date:  08/21/2022 CLINICAL DATA:  Chest pain. EXAM: CHEST - 2 VIEW COMPARISON:  Abdominal series 07/04/2021 FINDINGS: The heart is moderately enlarged, unchanged. There is no focal lung consolidation, pleural effusion or pneumothorax. No acute fractures are seen. There are atherosclerotic calcifications of the aorta. IMPRESSION: 1. Stable moderate cardiomegaly. 2. No acute cardiopulmonary process. Electronically Signed   By: Ronney Asters M.D.   On: 08/21/2022 17:42     Physical Exam Vitals reviewed.  Constitutional:      Appearance: She is ill-appearing.  HENT:     Right Ear: External ear normal.     Left Ear: External ear normal.     Mouth/Throat:     Mouth: Mucous membranes are dry.  Eyes:     Extraocular Movements: Extraocular movements intact.  Cardiovascular:     Rate and Rhythm: Normal rate and regular rhythm.     Heart sounds: Murmur heard.  Pulmonary:     Effort: Pulmonary effort is normal.     Breath sounds: Normal breath sounds.  Abdominal:     General: There is distension.     Tenderness: There is abdominal tenderness. There is no guarding.  Musculoskeletal:     Right lower leg: No edema.     Left lower leg: No edema.  Neurological:     General: No focal deficit present.     Mental Status: She is alert and oriented to person, place, and time.  Psychiatric:        Mood and Affect: Mood normal.        Behavior: Behavior normal.    Vitals:   08/21/22 2000 08/21/22 2030 08/21/22 2100 08/21/22 2130  BP: 134/89 108/79 (!) 140/103 (!) 125/106  Pulse: 85 81 94 73  Resp: '19 19 19 19  '$ Temp:    98 F (36.7 C)  TempSrc:      SpO2: 100% 99% 98% 96%  Weight:         Assessment and Plan: * Abdominal swelling Attribute to CHF. ? If there is any other underlying new etiology or malignancy. Pt has her uterus and ovaries.  Last ct abd showed: Left upper retroperitoneal mass measuring 5.6 cm, adjacent to retroperitoneal surgical clips, similar in appearance to prior chest CT in  April 2021. 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 Prominent left para-aortic lymph node. Correlate with surgical history and any recent surveillance imaging if performed at another institution.   Small volume of abdominopelvic ascites. No other acute abnormality in the abdomen or pelvis.   Partially visualized skin thickening of the breasts bilaterally, correlate with mammography.  Left upper retroperitoneal mass measuring 5.6 cm, adjacent to retroperitoneal surgical clips, similar in appearance to prior chest CT in April 2021. 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 Prominent left para-aortic lymph node. Correlate with surgical history and any recent surveillance imaging if performed at another institution.   Small volume of  abdominopelvic ascites. No other acute abnormality in the abdomen or pelvis.   Partially visualized skin thickening of the breasts bilaterally, correlate with mammography.   We will obtain ct abd noncontrast.  Additional consult as deemed appropriate.  Pt has received lasix in ed and will wait for imaging and nephrology and further plan for t/t of abd distension.    Acute kidney injury superimposed on chronic kidney disease ([place])    Latest Ref Rng & Units 08/21/2022    5:05 PM 06/28/2021    8:43 AM 06/14/2021    3:49 PM  CMP  Glucose 70 - 99 mg/dL 94   87   BUN 6 - 20 mg/dL 65   17   Creatinine 0.44 - 1.00 mg/dL 2.73   1.13   Sodium 135 - 145 mmol/L 139   139   Potassium 3.5 - 5.1 mmol/L 3.4   4.6   Chloride 98 - 111 mmol/L 107   102   CO2 22 - 32 mmol/L 17   26   Calcium 8.9 - 10.3 mg/dL 8.0   9.0   Total Protein 6.0 - 8.3 g/dL  7.3  6.6   Total Bilirubin 0.2 - 1.2 mg/dL  0.6  0.6   Alkaline Phos 39 - 117 U/L  120    AST 0 - 37 U/L  17  54   ALT 0 - 35 U/L  14  88   Nephrology consult.  Suspect acute worsening due to diuretic but also prerenal due to CHF.  Will obtain renal usg  and evaluate for worsening renal function and also obstruction.    Acute on chronic combined systolic and diastolic CHF (congestive heart failure) ([place]) Pt sees dr.Bridgette christopher. Last echo was 05/2021 shows:  1. Left ventricular ejection fraction, by estimation, is 40 to 45%. Left  ventricular ejection fraction by 3D volume is 47 %. The left ventricle has  mildly decreased function. The left ventricle demonstrates global  hypokinesis. Left ventricular diastolic   parameters are consistent with Grade II diastolic dysfunction  (pseudonormalization). Elevated left ventricular end-diastolic pressure.   2. Right ventricular systolic function is severely reduced. The right  ventricular size is mildly enlarged. There is severely elevated pulmonary  artery systolic pressure. The estimated right ventricular systolic  pressure is 87.8 mmHg.   3. Left atrial size was moderately dilated.   4. Right atrial size was severely dilated.   5. The mitral valve is degenerative with mild to moderate thickening of  the mitral valve leaflets. There is teathering of the MV leaflets due to  mild LV dysfunction. Severe mitral valve regurgitation. Mild to moderate  mitral stenosis. The mean mitral  valve gradient is 6.5 mmHg.   6. Tricuspid valve regurgitation is moderate to severe.   7. The aortic valve is tricuspid. Aortic valve regurgitation is mild.  Mild aortic valve sclerosis is present, with no evidence of aortic valve  stenosis. Aortic regurgitation PHT measures 499 msec.   8. The inferior vena cava is dilated in size with <50% respiratory  variability, suggesting right atrial pressure of 15 mmHg.   9. There is a trivial pericardial effusion posterior to the left  ventricle.  10. Compared to prior echo, Mitral regurgitation appears worse and RV  dysfunction is now present. There is severe Pulmonary HTN.  -continue metoprolol. She was on entresot in past.     NSTEMI (non-ST elevated  myocardial infarction) ([place]) Pt's current troponin is high and has come down. Attribute to demand  ischemia.  We will consult cardiology with AM Message.  Cont metoprolol. Pt received asa 324.   Tobacco abuse Nicotine patch.  Cessation education when pt is clinically improved.        ***   Unresulted Labs (From admission, onward)    None        DVT prophylaxis:  ***   Code Status:  ***   Family Communication:  Devetta, [place] (Daughter)  [phone] (Home Phone)   Disposition Plan:  ***   Consults called:  Dr.Nahser: [facility] cardiology. Dr.Singh: Nephrology.   Admission status: ***   Unit/ Expected LOS: ***   Para Skeans MD [facility]  6 PM- 2 AM. Please contact me via secure Chat 6 PM-2 AM. [phone] ( Pager ) To contact the [facility] Attending or Consulting provider [place] or covering provider during after hours [place], for this patient.   Check the care team in [facility] and look for a) attending/consulting [facility] provider listed and b) the [facility] team listed Log into [email or web address] and use [place]'s universal password to access. If you do not have the password, please contact the hospital operator. Locate the [facility] provider you are looking for under [facility] and page to a number that you can be directly reached. If you still have difficulty reaching the provider, please page the [facility] (Director on Call) for the Hospitalists listed on amion for assistance. [email or web address] 08/21/2022, 10:06 PM

## 2022-08-21 NOTE — Hospital Course (Addendum)
61 year old female with past medical history of multiple sclerosis and systolic/diastolic heart failure with moderate to severe tricuspid regurg and severe mitral regurg presented to the emergency room on 9/14 with complaints of increased facial and abdominal swelling for the past few weeks.  Patient is on diuretics, but has not been taking them as of late.  In the emergency room, patient found to have acute kidney injury with a creatinine of 2.73, BNP of greater than 4500, troponin of 600 and she was given a dose of Lasix in the emergency room.  Cardiology and nephrology consulted.  Following suboptimal response to doses of IV Lasix, patient changed over to Lasix drip to treat volume overload driven by biventricular failure. Per cardiology, unable to add ACE/ARB/ARNI/aldosterone antagonist d/t renal function, and she is not a candidate for repair of valvular disease - cardiac treatment options limited. Palliative care following.   By 9/20, nephrology had converted patient from Lasix drip over to oral Demadex.  Patient continues to have good urine output and improvement in renal function.  By 9/24, felt to be fully diuresed and Demadex briefly stopped due to overdiuresis. Home health confirmed and pt appropriate for discharge 09/25, spoke w/ daughter this morning and no further concerns.

## 2022-08-21 NOTE — ED Provider Notes (Signed)
[facility] Provider Note    Event Date/Time   First MD Initiated Contact with Patient 08/21/22 1842     (approximate)   History   Chest Pain   HPI  Anita [name] is a 61 y.o. female  who presents to the emergency department today because of concern for swelling.  The patient states that she has a history of heart failure.  For the past 2 weeks she has noticed increasing swelling in her abdomen.  The patient has also had some swelling in her face.  She does have diuretics but has not been taking them regularly.  Patient denies any significant shortness of breath.  She has had some intermittent chest pain but denies any at the time my exam.      Physical Exam   Triage Vital Signs: ED Triage Vitals  Enc Vitals Group     BP 08/21/22 1656 117/89     Pulse Rate 08/21/22 1656 80     Resp 08/21/22 1656 18     Temp 08/21/22 1656 97.8 F (36.6 C)     Temp Source 08/21/22 1656 Oral     SpO2 08/21/22 1656 97 %     Weight 08/21/22 1658 135 lb (61.2 kg)     Height --      Head Circumference --      Peak Flow --      Pain Score 08/21/22 1658 0     Pain Loc --      Pain Edu? --      Excl. in [place]? --     Most recent vital signs: Vitals:   08/21/22 1656 08/21/22 1817  BP: 117/89 (!) 134/100  Pulse: 80 84  Resp: 18 19  Temp: 97.8 F (36.6 C)   SpO2: 97% 96%    General: Awake, alert, oriented. CV:  Good peripheral perfusion. Regular rate and rhythm. Resp:  Normal effort. Lungs clear. Abd:  Distended, non tender.   ED Results / Procedures / Treatments   Labs (all labs ordered are listed, but only abnormal results are displayed) Labs Reviewed  BASIC METABOLIC PANEL - Abnormal; Notable for the following components:      Result Value   Potassium 3.4 (*)    CO2 17 (*)    BUN 65 (*)    Creatinine, Ser 2.73 (*)    Calcium 8.0 (*)    GFR, Estimated 19 (*)    All other components within normal limits  CBC - Abnormal; Notable for the  following components:   Hemoglobin 10.6 (*)    MCHC 29.4 (*)    RDW 17.8 (*)    All other components within normal limits  BRAIN NATRIURETIC PEPTIDE - Abnormal; Notable for the following components:   B Natriuretic Peptide >4,500.0 (*)    All other components within normal limits  TROPONIN I (HIGH SENSITIVITY) - Abnormal; Notable for the following components:   Troponin I (High Sensitivity) 601 (*)    All other components within normal limits  TROPONIN I (HIGH SENSITIVITY)     EKG  I, Nance Pear, attending physician, personally viewed and interpreted this EKG  EKG Time: 1704 Rate: 81 Rhythm: sinus rhythm Axis: right axis deviation Intervals: qtc 466 QRS: LPFB ST changes: no st elevation Impression: abnormal ekg    RADIOLOGY I independently interpreted and visualized the CXR. My interpretation: cardiomegaly Radiology interpretation:  IMPRESSION:  1. Stable moderate cardiomegaly.  2. No acute cardiopulmonary process.  PROCEDURES:  Critical Care performed: No  Procedures   MEDICATIONS ORDERED IN ED: Medications  furosemide (LASIX) injection 60 mg (has no administration in time range)  aspirin chewable tablet 324 mg (324 mg Oral Given 08/21/22 1841)     IMPRESSION / MDM / ASSESSMENT AND PLAN / ED COURSE  I reviewed the triage vital signs and the nursing notes.                              Differential diagnosis includes, but is not limited to, heart failure, liver failure, kidney failure.  Patient's presentation is most consistent with acute presentation with potential threat to life or bodily function.  Patient presented to the emergency department today because of concerns for swelling.  On exam patient does have significant abdominal swelling.  Nontender.  Blood work shows elevated troponin as well as elevated BNP.  Patient did state that she has had some intermittent chest pain although none at the time my exam.  Repeat troponin was improving.   Patient was given aspirin.  Additionally given significantly elevated BNP she was started on Lasix.  Discussed with Dr. Posey Pronto with the hospital service will plan on admission.   FINAL CLINICAL IMPRESSION(S) / ED DIAGNOSES   Final diagnoses:  Elevated troponin  Hypervolemia, unspecified hypervolemia type     Note:  This document was prepared using Dragon voice recognition software and may include unintentional dictation errors.    Nance Pear, MD 08/21/22 [phone]

## 2022-08-21 NOTE — Assessment & Plan Note (Signed)
Nicotine patch.  Cessation education when pt is clinically improved.

## 2022-08-21 NOTE — Progress Notes (Signed)
[facility] Consult Note Telephone: [phone]  Fax: [phone]    Date of encounter: 08/21/22 3:20 PM PATIENT NAME: Anita [name] [address] [place] [address]   [phone] (home)  DOB: Jul 04, 1961 MRN: [id number] PRIMARY CARE PROVIDER:    [name], NP,  [place] [address] [phone]  REFERRING PROVIDER:   [name], NP [address] [place],  [place] [address] [phone]  RESPONSIBLE PARTY:    Contact Information     Name Relation Home Work Mobile   [place] Daughter [phone]        Due to the COVID-19 crisis, this visit was done via telemedicine from my office and it was initiated and consent by this patient and or family.  I connected with [name] with [facility] on 08/21/22 by a video enabled telemedicine application and verified that I am speaking with the correct person using two identifiers.   I discussed the limitations of evaluation and management by telemedicine. The patient expressed understanding and agreed to proceed. Palliative Care was asked to follow this patient by consultation request of  [name], NP to address advance care planning and complex medical decision making. This is a follow up visit.                                  ASSESSMENT AND PLAN / RECOMMENDATIONS:  Symptom Management/Plan: 1. Advance Care Planning; Full code, will continue to discussions   2. Ascites with facial edema right side worse than left/nausea by video, appears will likely need paracentesis. Has not taken diuretic in several days per daughter. Onset edema over the last several days, increase in weakness, no sob; discussed at length best plan to go to ED, daughter will call 911 for transport as too weak to be able to transfer by pov. Discussed with daughter about likely require hospitalization and concerns about impact with  multi-organ involvement; onset to a further decline. We talked about paracentesis which she has not had to have in >1 year, though concern of re-occurrence, functional decline in addition to MS with disease progression. [name] endorses wishes are STR with possible LTC placement if she continues to functionally declines. Mentioned Hospice in prior palliative visit though at that time still wish to have more aggressive treatments though with this hospitalization should be re-visited. Support provided.    3. Palliative care encounter; Palliative care encounter; Palliative medicine team will continue to support patient, patient's family, and medical team. Visit consisted of counseling and education dealing with the complex and emotionally intense issues of symptom management and palliative care in the setting of serious and potentially life-threatening illness  Follow up Palliative Care Visit: Palliative care will continue to follow for complex medical decision making, advance care planning, and clarification of goals. Return 2 weeks or prn.  I spent 32 minutes providing this consultation. More than 50% of the time in this consultation was spent in counseling and care coordination. PPS: 30% (normally 50%)  Chief Complaint: Follow up palliative consult for complex medical decision making, address goals, manage ongoing symptoms  HISTORY OF PRESENT ILLNESS:  Anita [name] is a 61 y.o. year old female  with multiple medical problems including hx of MS, CAD, severe MR with moderate mitral stenosis, COPD, tobacco use, systolic and diastolic heart failure.  I connected with [name] and daughter [name] by video. [name] had to sit Anita [name] up on the bed though unable to sit up straight, abdomen very swollen, large round with facial edema, right side greater than left, denies sob. [name] endorses extremities are not swollen this time like a year ago. Discussed clinical concerns, [name]  endorses Anita [name] has not taken diuretic in several days, though Ms Craw endorses she thinks she has. We talked about ros, concern for symptoms, medical goals, plan to go to ED for interventions, labs and likely hospitalization with paracentesis. Anita [name] in agreement, [name] to call 911 as Ms [name] is too weak to be able to get in the car. Questions answered.   History obtained from review of EMR, discussion with primary team, and interview with family, facility staff/caregiver and/or Ms. Coulthard.  I reviewed available labs, medications, imaging, studies and related documents from the EMR.  Records reviewed and summarized above.   ROS 10 point system reviewed negative except +ascites, +nausea, +anorexia, +weakness, +facial edema  Physical Exam: deferred Thank you for the opportunity to participate in the care of Anita [name].  The palliative care team will continue to follow. Please call our office at [phone] if we can be of additional assistance.   Rayshawn Maney Ihor Gully, NP

## 2022-08-21 NOTE — Assessment & Plan Note (Addendum)
Secondary to decompensated heart failure.  Baseline creatinine around 1.13.  Nephrology following.  No indication for dialysis.  Creatinine trending upward for the first time, up to 1.5.  Will monitor closely

## 2022-08-21 NOTE — Assessment & Plan Note (Signed)
Pt's current troponin is high and has come down. Attribute to demand ischemia.  We will consult cardiology with AM Message.  Cont metoprolol. Pt received asa 324.

## 2022-08-21 NOTE — ED Notes (Signed)
Lunch box and sprite given to pt.

## 2022-08-22 ENCOUNTER — Inpatient Hospital Stay: Payer: Medicare Other

## 2022-08-22 ENCOUNTER — Inpatient Hospital Stay (HOSPITAL_COMMUNITY)
Admit: 2022-08-22 | Discharge: 2022-08-22 | Disposition: A | Discharge status: Home / Self Care | Payer: Medicare Other | Attending: Medical | Admitting: Medical

## 2022-08-22 DIAGNOSIS — R188 Other ascites: Secondary | ICD-10-CM | POA: Diagnosis not present

## 2022-08-22 DIAGNOSIS — I5023 Acute on chronic systolic (congestive) heart failure: Secondary | ICD-10-CM | POA: Diagnosis not present

## 2022-08-22 DIAGNOSIS — Z720 Tobacco use: Secondary | ICD-10-CM | POA: Diagnosis not present

## 2022-08-22 DIAGNOSIS — Z20822 Contact with and (suspected) exposure to covid-19: Secondary | ICD-10-CM | POA: Diagnosis present

## 2022-08-22 DIAGNOSIS — I255 Ischemic cardiomyopathy: Secondary | ICD-10-CM | POA: Diagnosis present

## 2022-08-22 DIAGNOSIS — K761 Chronic passive congestion of liver: Secondary | ICD-10-CM | POA: Diagnosis present

## 2022-08-22 DIAGNOSIS — E8770 Fluid overload, unspecified: Secondary | ICD-10-CM | POA: Diagnosis not present

## 2022-08-22 DIAGNOSIS — I10 Essential (primary) hypertension: Secondary | ICD-10-CM

## 2022-08-22 DIAGNOSIS — I081 Rheumatic disorders of both mitral and tricuspid valves: Secondary | ICD-10-CM | POA: Diagnosis present

## 2022-08-22 DIAGNOSIS — D631 Anemia in chronic kidney disease: Secondary | ICD-10-CM | POA: Diagnosis present

## 2022-08-22 DIAGNOSIS — J449 Chronic obstructive pulmonary disease, unspecified: Secondary | ICD-10-CM | POA: Diagnosis present

## 2022-08-22 DIAGNOSIS — N179 Acute kidney failure, unspecified: Secondary | ICD-10-CM | POA: Diagnosis present

## 2022-08-22 DIAGNOSIS — I5043 Acute on chronic combined systolic (congestive) and diastolic (congestive) heart failure: Secondary | ICD-10-CM | POA: Diagnosis present

## 2022-08-22 DIAGNOSIS — J432 Centrilobular emphysema: Secondary | ICD-10-CM | POA: Diagnosis not present

## 2022-08-22 DIAGNOSIS — R140 Abdominal distension (gaseous): Secondary | ICD-10-CM

## 2022-08-22 DIAGNOSIS — N1832 Chronic kidney disease, stage 3b: Secondary | ICD-10-CM | POA: Diagnosis present

## 2022-08-22 DIAGNOSIS — I248 Other forms of acute ischemic heart disease: Secondary | ICD-10-CM | POA: Diagnosis present

## 2022-08-22 DIAGNOSIS — E8721 Acute metabolic acidosis: Secondary | ICD-10-CM | POA: Diagnosis present

## 2022-08-22 DIAGNOSIS — G35 Multiple sclerosis: Secondary | ICD-10-CM

## 2022-08-22 DIAGNOSIS — R601 Generalized edema: Secondary | ICD-10-CM | POA: Diagnosis not present

## 2022-08-22 DIAGNOSIS — D509 Iron deficiency anemia, unspecified: Secondary | ICD-10-CM | POA: Diagnosis present

## 2022-08-22 DIAGNOSIS — N1831 Chronic kidney disease, stage 3a: Secondary | ICD-10-CM | POA: Diagnosis not present

## 2022-08-22 DIAGNOSIS — R7989 Other specified abnormal findings of blood chemistry: Secondary | ICD-10-CM | POA: Diagnosis present

## 2022-08-22 DIAGNOSIS — I130 Hypertensive heart and chronic kidney disease with heart failure and stage 1 through stage 4 chronic kidney disease, or unspecified chronic kidney disease: Secondary | ICD-10-CM | POA: Diagnosis present

## 2022-08-22 DIAGNOSIS — N189 Chronic kidney disease, unspecified: Secondary | ICD-10-CM | POA: Diagnosis not present

## 2022-08-22 DIAGNOSIS — I272 Pulmonary hypertension, unspecified: Secondary | ICD-10-CM | POA: Diagnosis present

## 2022-08-22 DIAGNOSIS — I2510 Atherosclerotic heart disease of native coronary artery without angina pectoris: Secondary | ICD-10-CM | POA: Diagnosis present

## 2022-08-22 DIAGNOSIS — R0609 Other forms of dyspnea: Secondary | ICD-10-CM

## 2022-08-22 DIAGNOSIS — D649 Anemia, unspecified: Secondary | ICD-10-CM | POA: Diagnosis not present

## 2022-08-22 DIAGNOSIS — D3610 Benign neoplasm of peripheral nerves and autonomic nervous system, unspecified: Secondary | ICD-10-CM | POA: Diagnosis present

## 2022-08-22 DIAGNOSIS — R1909 Other intra-abdominal and pelvic swelling, mass and lump: Secondary | ICD-10-CM | POA: Diagnosis not present

## 2022-08-22 DIAGNOSIS — K219 Gastro-esophageal reflux disease without esophagitis: Secondary | ICD-10-CM | POA: Diagnosis present

## 2022-08-22 DIAGNOSIS — E876 Hypokalemia: Secondary | ICD-10-CM | POA: Diagnosis present

## 2022-08-22 DIAGNOSIS — I5082 Biventricular heart failure: Secondary | ICD-10-CM | POA: Diagnosis present

## 2022-08-22 DIAGNOSIS — E871 Hypo-osmolality and hyponatremia: Secondary | ICD-10-CM | POA: Diagnosis not present

## 2022-08-22 DIAGNOSIS — N2889 Other specified disorders of kidney and ureter: Secondary | ICD-10-CM | POA: Diagnosis not present

## 2022-08-22 DIAGNOSIS — E860 Dehydration: Secondary | ICD-10-CM | POA: Diagnosis present

## 2022-08-22 DIAGNOSIS — R778 Other specified abnormalities of plasma proteins: Secondary | ICD-10-CM

## 2022-08-22 DIAGNOSIS — R7401 Elevation of levels of liver transaminase levels: Secondary | ICD-10-CM | POA: Diagnosis not present

## 2022-08-22 DIAGNOSIS — N6459 Other signs and symptoms in breast: Secondary | ICD-10-CM | POA: Diagnosis present

## 2022-08-22 DIAGNOSIS — R609 Edema, unspecified: Secondary | ICD-10-CM | POA: Diagnosis not present

## 2022-08-22 LAB — COMPREHENSIVE METABOLIC PANEL
ALT: 559 U/L — ABNORMAL HIGH (ref 0–44)
AST: 464 U/L — ABNORMAL HIGH (ref 15–41)
Albumin: 3.5 g/dL (ref 3.5–5.0)
Alkaline Phosphatase: 118 U/L (ref 38–126)
Anion gap: 11 (ref 5–15)
BUN: 66 mg/dL — ABNORMAL HIGH (ref 6–20)
CO2: 21 mmol/L — ABNORMAL LOW (ref 22–32)
Calcium: 8.5 mg/dL — ABNORMAL LOW (ref 8.9–10.3)
Chloride: 104 mmol/L (ref 98–111)
Creatinine, Ser: 2.6 mg/dL — ABNORMAL HIGH (ref 0.44–1.00)
GFR, Estimated: 21 mL/min — ABNORMAL LOW (ref >60)
Glucose, Bld: 107 mg/dL — ABNORMAL HIGH (ref 70–99)
Potassium: 3.7 mmol/L (ref 3.5–5.1)
Sodium: 136 mmol/L (ref 135–145)
Total Bilirubin: 1.5 mg/dL — ABNORMAL HIGH (ref 0.3–1.2)
Total Protein: 7.1 g/dL (ref 6.5–8.1)

## 2022-08-22 LAB — ECHOCARDIOGRAM COMPLETE
AR max vel: 1.55 cm2
AV Area VTI: 1.6 cm2
AV Area mean vel: 1.37 cm2
AV Mean grad: 2.5 mmHg
AV Peak grad: 4.5 mmHg
Ao pk vel: 1.06 m/s
Area-P 1/2: 3.34 cm2
Calc EF: 32.6 %
Height: 63 in
S' Lateral: 3.9 cm
Single Plane A2C EF: 27.8 %
Single Plane A4C EF: 34.7 %
Weight: 2229.29 oz

## 2022-08-22 LAB — CBC
HCT: 34.5 % — ABNORMAL LOW (ref 36.0–46.0)
Hemoglobin: 10.6 g/dL — ABNORMAL LOW (ref 12.0–15.0)
MCH: 27.5 pg (ref 26.0–34.0)
MCHC: 30.7 g/dL (ref 30.0–36.0)
MCV: 89.6 fL (ref 80.0–100.0)
Platelets: 192 10*3/uL (ref 150–400)
RBC: 3.85 MIL/uL — ABNORMAL LOW (ref 3.87–5.11)
RDW: 17.9 % — ABNORMAL HIGH (ref 11.5–15.5)
WBC: 9.1 10*3/uL (ref 4.0–10.5)
nRBC: 0 % (ref 0.0–0.2)

## 2022-08-22 LAB — TYPE AND SCREEN
ABO/RH(D): A NEG
Antibody Screen: NEGATIVE

## 2022-08-22 LAB — HIV ANTIBODY (ROUTINE TESTING W REFLEX): HIV Screen 4th Generation wRfx: NONREACTIVE

## 2022-08-22 MED ORDER — TRAMADOL HCL 50 MG PO TABS
50.0000 mg | ORAL_TABLET | Freq: Four times a day (QID) | ORAL | Status: DC | PRN
  Administered 2022-08-22 – 2022-09-01 (×16): 50 mg via ORAL
  Filled 2022-08-22 (×17): qty 1

## 2022-08-22 MED ORDER — SODIUM CHLORIDE 0.9 % IV SOLN: 250.0000 mL | INTRAVENOUS | Status: DC | PRN

## 2022-08-22 MED ORDER — ACETAMINOPHEN 325 MG PO TABS
650.0000 mg | ORAL_TABLET | Freq: Four times a day (QID) | ORAL | Status: DC | PRN
  Administered 2022-08-29 – 2022-09-01 (×5): 650 mg via ORAL
  Filled 2022-08-22 (×5): qty 2

## 2022-08-22 MED ORDER — PANTOPRAZOLE SODIUM 40 MG IV SOLR
40.0000 mg | Freq: Two times a day (BID) | INTRAVENOUS | Status: DC
  Administered 2022-08-22 – 2022-08-23 (×5): 40 mg via INTRAVENOUS
  Filled 2022-08-22 (×5): qty 10

## 2022-08-22 MED ORDER — METOPROLOL SUCCINATE ER 25 MG PO TB24
25.0000 mg | ORAL_TABLET | Freq: Every day | ORAL | Status: DC
  Administered 2022-08-22 – 2022-09-01 (×11): 25 mg via ORAL
  Filled 2022-08-22 (×11): qty 1

## 2022-08-22 MED ORDER — FERROUS SULFATE 325 (65 FE) MG PO TABS
325.0000 mg | ORAL_TABLET | Freq: Two times a day (BID) | ORAL | Status: DC
  Administered 2022-08-22 – 2022-09-01 (×22): 325 mg via ORAL
  Filled 2022-08-22 (×20): qty 1

## 2022-08-22 MED ORDER — SODIUM CHLORIDE 0.9% FLUSH
3.0000 mL | Freq: Two times a day (BID) | INTRAVENOUS | Status: DC
  Administered 2022-08-22 – 2022-09-01 (×11): 3 mL via INTRAVENOUS

## 2022-08-22 MED ORDER — ONDANSETRON HCL 4 MG/2ML IJ SOLN
4.0000 mg | Freq: Four times a day (QID) | INTRAMUSCULAR | Status: DC | PRN
  Administered 2022-08-22 – 2022-08-23 (×2): 4 mg via INTRAVENOUS
  Filled 2022-08-22 (×2): qty 2

## 2022-08-22 MED ORDER — ACETAMINOPHEN 325 MG PO TABS
650.0000 mg | ORAL_TABLET | Freq: Four times a day (QID) | ORAL | Status: DC | PRN
  Administered 2022-08-22: 650 mg via ORAL
  Filled 2022-08-22: qty 2

## 2022-08-22 MED ORDER — ACETAMINOPHEN 650 MG RE SUPP: 650.0000 mg | Freq: Four times a day (QID) | RECTAL | Status: DC | PRN

## 2022-08-22 MED ORDER — FLUTICASONE FUROATE-VILANTEROL 200-25 MCG/ACT IN AEPB
1.0000 | INHALATION_SPRAY | Freq: Every day | RESPIRATORY_TRACT | Status: DC
  Administered 2022-08-22 – 2022-09-01 (×9): 1 via RESPIRATORY_TRACT
  Filled 2022-08-22 (×2): qty 28

## 2022-08-22 MED ORDER — ALBUTEROL SULFATE (2.5 MG/3ML) 0.083% IN NEBU: 2.5000 mg | INHALATION_SOLUTION | Freq: Four times a day (QID) | RESPIRATORY_TRACT | Status: DC | PRN

## 2022-08-22 MED ORDER — SODIUM CHLORIDE 0.9% FLUSH
3.0000 mL | Freq: Two times a day (BID) | INTRAVENOUS | Status: DC
  Administered 2022-08-22 – 2022-08-31 (×14): 3 mL via INTRAVENOUS

## 2022-08-22 MED ORDER — FUROSEMIDE 10 MG/ML IJ SOLN
20.0000 mg | Freq: Two times a day (BID) | INTRAMUSCULAR | Status: DC
  Administered 2022-08-22 (×2): 20 mg via INTRAVENOUS
  Filled 2022-08-22 (×2): qty 4

## 2022-08-22 MED ORDER — ENOXAPARIN SODIUM 30 MG/0.3ML IJ SOSY
30.0000 mg | PREFILLED_SYRINGE | INTRAMUSCULAR | Status: DC
  Administered 2022-08-22 – 2022-08-26 (×5): 30 mg via SUBCUTANEOUS
  Filled 2022-08-22 (×5): qty 0.3

## 2022-08-22 MED ORDER — SODIUM CHLORIDE 0.9% FLUSH
3.0000 mL | INTRAVENOUS | Status: DC | PRN
  Administered 2022-08-22 – 2022-08-23 (×2): 3 mL via INTRAVENOUS

## 2022-08-22 MED ORDER — ENSURE ENLIVE PO LIQD
237.0000 mL | Freq: Two times a day (BID) | ORAL | Status: DC
  Administered 2022-08-22 – 2022-08-27 (×12): 237 mL via ORAL

## 2022-08-22 NOTE — Plan of Care (Signed)

## 2022-08-22 NOTE — Assessment & Plan Note (Signed)
Secondary to CHF.  Right ventricular failure causing transaminitis and overall hepatic congestion.  As above.

## 2022-08-22 NOTE — Assessment & Plan Note (Signed)
Demand ischemia versus elevations due to renal failure and heart failure.  Cardiology following.  Cycle as needed.  Patient denies any chest pain at this time.

## 2022-08-22 NOTE — Assessment & Plan Note (Signed)
Elevated diastolic blood pressures in part due to right ventricular failure.  Numbers improved with diuresis.

## 2022-08-22 NOTE — Progress Notes (Signed)
[facility]) Hospital Liaison note:  This patient is currently enrolled in [facility] outpatient-based Palliative Care. Will continue to follow for disposition.  Please call with any outpatient palliative questions or concerns.  Thank you, Lorelee Market, LPN [name] Liaison [phone]

## 2022-08-22 NOTE — ED Notes (Signed)
Request made for transport to the floor ?

## 2022-08-22 NOTE — Plan of Care (Signed)

## 2022-08-22 NOTE — Consult Note (Signed)
Patient not in the room today.  Will be seen on Saturday.

## 2022-08-22 NOTE — Assessment & Plan Note (Addendum)
Cardiology following. At this point no apparent plans for ischemic workup.

## 2022-08-22 NOTE — Assessment & Plan Note (Signed)
Stable, denies any shortness of breath

## 2022-08-22 NOTE — Assessment & Plan Note (Signed)
Stable at this time.  Not on any medications.

## 2022-08-22 NOTE — TOC Progression Note (Signed)
Transition of Care [facility]) - Progression Note    Patient Details  Name: Anita [name] MRN: [id number] Date of Birth: Mar 21, 1961  Transition of Care [facility]) CM/SW [place], RN Phone Number: 08/22/2022, 1:32 PM  Clinical Narrative:     61 year old woman with history of MS, multivessel coronary artery disease, mitral valve regurgitation and stenosis, prior smoker, COPD, presenting with weakness, inability to ambulate, abdominal distention the past several weeks   Followed by palliative care, not on hospice  TOC to follow and assist with DC planning       Expected Discharge Plan and Services                                                 Social Determinants of Health (SDOH) Interventions    Readmission Risk Interventions    06/06/2021    9:43 AM  Readmission Risk Prevention Plan  Transportation Screening Complete  PCP or Specialist Appt within 3-5 Days Complete  HRI or Home Care Consult Complete  Social Work Consult for [place] Planning/Counseling Not Complete  SW consult not completed comments RNCM assigned to case  Palliative Care Screening Not Applicable  Medication Review Press photographer) Complete

## 2022-08-22 NOTE — Consult Note (Signed)
Cardiology Consultation   Patient ID: Anita [name] MRN: [id number]; DOB: 1961/01/20  Admit date: 08/21/2022 Date of Consult: 08/22/2022  PCP:  [name], NP   [facility] Cardiologist:  Buford Dresser, MD   {   Patient Profile:   Anita [name] is a 61 y.o. female with a hx of multiple sclerosis, CAD, severe MR with moderate mitral stenosis, COPD prior tobacco use who is being seen 08/22/2022 for the evaluation of CHF at the request of Dr. [place] Pink.  History of Present Illness:   Anita [name] is followed by Dr. Harrell Gave. She underwetn cardiac catheterization in 03/2020 showing moderate to severe multivessel CAD s/ a CTO of the RCAA and L>L collaterals. She was noted to have moderate to severe MR and MS. She has not been felt to be a surgical mitraclip candidate and has been medically managed. Echo in 02/2021 showed LVEF 40-45%, global KF, G2DD, sev reduced RV fxn, RVSP 69.70mHg, mod dil LA, severe RA dil, severe MR and mild to mod MD, mean MV gradient 6.5463mg, mod to severe TR, mild AI, mild Aortic sclerosis w/o stenosis.   Patient has been admitted multiple times over the year for acute heart failure and respiratory failure. Echo from 02/2021 showed LVEF 40-45%, G2DD, severely reduced RV function, severely elevated pulmonary artery systolic pressure, severe MR, mild to mod stenosis, mean gradient 6.63m56m, mild AO. MR appeared worse.   Dr ChrHarrell Gavescussed with the patient 05/2021 [place] D[place]. Last seen 03/2022 with no changes. Patient is being followed by Palliative Care.   The patient presented to the ER 08/21/22 for swelling. The patient reports abdominal distension for the last 2 weeks. Patient had not been taking her diuretic therapy. No chest pain or LLE.   In the ER BP 117/89, pulse 80, RR 18, afebrile. Labs showed K3.4, Scr 2.73, BUN 65, CO2 17, Hgb 10.6, BNP >4500.  EKG showed NSR 81bpm, and no significant changes. HS trop  601. CXR showed no acute process. She was given lasix and admitted.   Past Medical History:  Diagnosis Date   Abnormality of gait 07/26/2013   Acute respiratory failure with hypoxia (HCCCharco4/13/2021   Allergy    Arthritis    Atypical pneumonia 03/22/2020   CAD (coronary artery disease)    a. 03/2020 Cath: LM nl, LAD 60/32m57md, D1 70, D2 70, LCX 45ost/p, 65p, RCA 100ost CTO. RPDA fills via collats from dLAD. Inf septal fills via collats from 1st septal, RPAV fills via collats from LPAV, RPL1/2 sev dzs-->med rx.   Chickenpox    Chronic combined systolic (congestive) and diastolic (congestive) heart failure (HCC)[place] a. 02/2021 Echo: EF 40-45%, glob HK, gr2 DD. Sev red RV fxn, RVSP 69.2mmH19mMod dil LA, sev dil RA. Sev MR. Mild to mod MS. Mean MV grad 6.63mmHg48mod-Sev TR. Mild AI. Mild Ao sclerosis w/o stenosis.   COPD (chronic obstructive pulmonary disease) (HCC)    Elevated troponin 03/22/2020   GERD (gastroesophageal reflux disease)    Headache(784.0)    Migraine   Hearing loss    Right ear secondary to infection   History of shingles    Ischemic cardiomyopathy    a. 02/2021 Echo: EF 40-45%, glob HK.   Migraines    Multiple sclerosis (HCC)    Obese    Optic neuritis    PAH (pulmonary artery hypertension) (HCC)  [place]. 02/2021 Echo: RVSP 69.2mmHg.73mProlonged QT interval 03/20/2020  Severe mitral regurgitation    a. 02/2021 Echo: Severe MR w/ mild to mod MS.  Mean MV grad 6.53mHg.    Past Surgical History:  Procedure Laterality Date   COLONOSCOPY WITH PROPOFOL N/A 08/03/2020   Procedure: COLONOSCOPY WITH PROPOFOL;  Surgeon: AJonathon Bellows MD;  [facility];  Service: Gastroenterology;  Laterality: N/A;   Ganglioneuroma     Resection   LEFT HEART CATH AND CORONARY ANGIOGRAPHY N/A 03/23/2020   Procedure: LEFT HEART CATH AND CORONARY ANGIOGRAPHY;  Surgeon: HLeonie Man MD;  [facility];  Service: Cardiovascular;  Laterality: N/A;   PILONIDAL CYST  EXCISION     PILONIDAL CYST EXCISION  1983   TUMOR REMOVAL  2007/2008     Home Medications:  Prior to Admission medications   Medication Sig Start Date End Date Taking? Authorizing Provider  albuterol (VENTOLIN HFA) 108 (90 Base) MCG/ACT inhaler INHALE 1-2 PUFFS INTO THE LUNGS EVERY 6 (SIX) HOURS AS NEEDED FOR WHEEZING OR SHORTNESS OF BREATH. USE SPARINGLY! 11/13/21   CPleas Koch NP  calcium carbonate (TUMS EX) 750 MG chewable tablet Chew 1 tablet by mouth daily as needed for heartburn.    [provider]  cetirizine (ZYRTEC) 10 MG tablet Take 10 mg by mouth daily.    [provider]  Doxylamine Succinate, Sleep, (SLEEP AID PO) Take 1 tablet by mouth at bedtime as needed (sleep).    [provider]  erythromycin ophthalmic ointment SMARTSIG:1 sparingly In Eye(s) 3 Times Daily 07/02/22   [provider]  feeding supplement, ENSURE ENLIVE, (ENSURE ENLIVE) LIQD Take 237 mLs by mouth 2 (two) times daily between meals. 03/28/20   MBarton Dubois MD  ferrous sulfate 325 (65 FE) MG EC tablet Take 1 tablet (325 mg total) by mouth 2 (two) times daily with a meal. 04/09/22   YEarlie Server MD  fluticasone-salmeterol (ADVAIR DISKUS) 250-50 MCG/ACT AEPB INHALE 1 PUFF INTO THE LUNGS TWICE A DAY 12/02/21   CPleas Koch NP  guaiFENesin (MUCINEX) 600 MG 12 hr tablet Take 600 mg by mouth 2 (two) times daily as needed for cough.    [provider]  metoprolol succinate (TOPROL-XL) 25 MG 24 hr tablet Take 1 tablet (25 mg total) by mouth daily. Take with or immediately following a meal. 01/01/22 12/27/22  CBuford Dresser MD  Multiple Vitamin (MULTIVITAMIN WITH MINERALS) TABS tablet Take 1 tablet by mouth daily. 03/29/20   MBarton Dubois MD  nitroGLYCERIN (NITROSTAT) 0.4 MG SL tablet Place 1 tablet (0.4 mg total) under the tongue every 5 (five) minutes as needed for chest pain. 08/02/21   HAlisa Graff FNP  omeprazole (PRILOSEC) 40 MG capsule Take 1 capsule  (40 mg total) by mouth daily. 03/04/21   AJonathon Bellows MD  Probiotic Product (PROBIOTIC ADVANCED) CAPS Take 1 capsule by mouth daily.    [provider]  torsemide (DEMADEX) 20 MG tablet Take for weight gain of 2 lbs in 1 day or 5 lbs in 1 week or obvious swelling. 01/01/22   CBuford Dresser MD  trolamine salicylate (ASPERCREME) 10 % cream Apply 1 application. topically daily as needed for muscle pain (back or joint pain).    [provider]  vitamin B-12 (CYANOCOBALAMIN) 1000 MCG tablet Take 1,000 mcg by mouth daily.    [provider]    Inpatient Medications: Scheduled Meds:  feeding supplement  237 mL Oral BID BM   ferrous sulfate  325 mg Oral BID WC   fluticasone  furoate-vilanterol  1 puff Inhalation Daily   metoprolol succinate  25 mg Oral Daily   pantoprazole (PROTONIX) IV  40 mg Intravenous Q12H   sodium chloride flush  3 mL Intravenous Q12H   sodium chloride flush  3 mL Intravenous Q12H   Continuous Infusions:  sodium chloride     PRN Meds: sodium chloride, acetaminophen **OR** acetaminophen, albuterol, sodium chloride flush  Allergies:    Allergies  Allergen Reactions   Acthar Hp [Corticotropin] Other (See Comments)    Throat swelling and coughing up blood   Codeine    Prednisone     Social History:   Social History   Socioeconomic History   Marital status: Divorced    Spouse name: Not on file   Number of children: 2   Years of education: Not on file   Highest education level: Not on file  Occupational History   Not on file  Tobacco Use   Smoking status: Former    Packs/day: 1.00    Types: Cigarettes   Smokeless tobacco: Never  Vaping Use   Vaping Use: Never used  Substance and Sexual Activity   Alcohol use: Yes    Comment: Occasional   Drug use: Never   Sexual activity: Not on file  Other Topics Concern   Not on file  Social History Narrative   Right handed    Lives in one story home   Drinks Caffeine - Sweet Tea     Social Determinants of Health   Financial Resource Strain: Low Risk  (07/08/2022)   Overall Financial Resource Strain (CARDIA)    Difficulty of Paying Living Expenses: Not hard at all  Food Insecurity: No Food Insecurity (08/22/2022)   Hunger Vital Sign    Worried About Running Out of Food in the Last Year: Never true    Ran Out of Food in the Last Year: Never true  Transportation Needs: No Transportation Needs (08/22/2022)   PRAPARE - Hydrologist (Medical): No    Lack of Transportation (Non-Medical): No  Physical Activity: Inactive (07/08/2022)   Exercise Vital Sign    Days of Exercise per Week: 0 days    Minutes of Exercise per Session: 0 min  Stress: No Stress Concern Present (07/08/2022)   [place]    Feeling of Stress : Not at all  Social Connections: Socially Isolated (07/08/2022)   Social Connection and Isolation Panel [NHANES]    Frequency of Communication with Friends and Family: Three times a week    Frequency of Social Gatherings with Friends and Family: Never    Attends Religious Services: Never    Marine scientist or Organizations: No    Attends Archivist Meetings: Never    Marital Status: Divorced  Human resources officer Violence: Not At Risk (08/22/2022)   Humiliation, Afraid, Rape, and Kick questionnaire    Fear of Current or Ex-Partner: No    Emotionally Abused: No    Physically Abused: No    Sexually Abused: No    Family History:    Family History  Problem Relation Age of Onset   Skin cancer Mother    Lung cancer Father    Uterine cancer Sister    Heart disease Brother    Bipolar disorder Sister    Throat cancer Paternal Grandmother      ROS:  Please see the history of present illness.   All other ROS reviewed and negative.  Physical Exam/Data:   Vitals:   08/21/22 2330 08/21/22 2332 08/22/22 0051 08/22/22 0236  BP:  114/86 (!) 155/83 114/82   Pulse: 85 84 91 82  Resp: '19 19 20 17  '$ Temp:  98 F (36.7 C) 98.6 F (37 C) 97.7 F (36.5 C)  TempSrc:  Oral Oral   SpO2: 97% 99% 95% 100%  Weight:    63.2 kg  Height:    '5\' 3"'$  (1.6 m)   No intake or output data in the 24 hours ending 08/22/22 0737    08/22/2022    2:36 AM 08/21/2022    4:58 PM 06/06/2022    1:50 PM  Last 3 Weights  Weight (lbs) 139 lb 5.3 oz 135 lb 130 lb  Weight (kg) 63.2 kg 61.236 kg 58.968 kg     Body mass index is 24.68 kg/m.  General:  Well nourished, well developed, in no acute distress HEENT: normal Neck: no JVD Vascular: No carotid bruits; Distal pulses 2+ bilaterally Cardiac:  normal S1, S2; RRR; + murmur  Lungs:  clear to auscultation bilaterally, no wheezing, rhonchi or rales  Abd: distended, no hepatomegaly  Ext: no edema Musculoskeletal:  No deformities, BUE and BLE strength normal and equal Skin: warm and dry  Neuro:  CNs 2-12 intact, no focal abnormalities noted Psych:  Normal affect   EKG:  The EKG was personally reviewed and demonstrates:  NSR 80bpm, LAD, LAFB, TWI aVL Telemetry:  Telemetry was personally reviewed and demonstrates:  NSR HR 80s  Relevant CV Studies:  Echo 3.3.22 1. Left ventricular ejection fraction, by estimation, is 40 to 45%. Left  ventricular ejection fraction by 3D volume is 47 %. The left ventricle has  mildly decreased function. The left ventricle demonstrates global  hypokinesis. Left ventricular diastolic   parameters are consistent with Grade II diastolic dysfunction  (pseudonormalization). Elevated left ventricular end-diastolic pressure.   2. Right ventricular systolic function is severely reduced. The right  ventricular size is mildly enlarged. There is severely elevated pulmonary  artery systolic pressure. The estimated right ventricular systolic  pressure is 62.8 mmHg.   3. Left atrial size was moderately dilated.   4. Right atrial size was severely dilated.   5. The mitral valve is degenerative  with mild to moderate thickening of  the mitral valve leaflets. There is teathering of the MV leaflets due to  mild LV dysfunction. Severe mitral valve regurgitation. Mild to moderate  mitral stenosis. The mean mitral  valve gradient is 6.5 mmHg.   6. Tricuspid valve regurgitation is moderate to severe.   7. The aortic valve is tricuspid. Aortic valve regurgitation is mild.  Mild aortic valve sclerosis is present, with no evidence of aortic valve  stenosis. Aortic regurgitation PHT measures 499 msec.   8. The inferior vena cava is dilated in size with <50% respiratory  variability, suggesting right atrial pressure of 15 mmHg.   9. There is a trivial pericardial effusion posterior to the left  ventricle.  10. Compared to prior echo, Mitral regurgitation appears worse and RV  dysfunction is now present. There is severe Pulmonary HTN.   Echo 03/20/20  1. Left ventricular ejection fraction, by estimation, is 40 to 45%. The  left ventricle has mildly decreased function. The left ventricle  demonstrates global hypokinesis. The left ventricular internal cavity size  was mildly dilated. Left ventricular  diastolic function could not be evaluated.   2. Right ventricular systolic function is normal. The right ventricular  size is normal.  There is moderately elevated pulmonary artery systolic  pressure.   3. Left atrial size was mildly dilated.   4. Moderate mitral subvalvular thickening/fibrosis.   5. The mitral valve is rheumatic. Moderate to severe mitral valve  regurgitation. Mild mitral stenosis. The mean mitral valve gradient is 7.3  mmHg with average heart rate of 105 bpm.   6. The aortic valve is normal in structure. Aortic valve regurgitation is  not visualized.   7. The inferior vena cava is dilated in size with <50% respiratory  variability, suggesting right atrial pressure of 15 mmHg.   Cath 03/23/20 Hemodynamics: LV end diastolic pressure is moderately elevated. ----Coronary  Angiography----- Ost RCA to Dist RCA lesion is 100% stenosed.-The PDA and PL system fills via faint collaterals from the AV groove LCx and LAD septals. Mid LAD-1 lesion is 60% stenosed. Mid LAD-2 lesion is 50% stenosed. Dist LAD lesion is 55% stenosed with 70% stenosed side branch in 2nd Diag. 1st Diag lesion is 70% stenosed. Ost Cx to Prox Cx lesion is 45% stenosed. Prox-MID Cx lesion is 65% stenosed.   MODERATE-SEVERE THREE-VESSEL CAD: 100% proximal RCA occlusion with left-to-right collaterals faintly filling PDA and PL system (AV groove LCx-PL and LAD septal-PDA) Diffuse moderate mid LAD disease with 60% to 50% stenosis. Tandem 50% and 65% proximal and mid LCx with the 65% lesion being the most significant lesion. Moderately elevated LVEDP of 18-20 mmHg   Given the extent of disease in the LAD and LCx, neither lesion is very amenable to PCI, and RCA is chronically occluded.  Given her comorbidities, I do not think she would be a good CABG candidate especially in light of the fact that she would likely require valve surgery as well.  She would not recover well.   Recommendations aggressive medical management.  Laboratory Data:  High Sensitivity Troponin:   Recent Labs  Lab 08/21/22 1705 08/21/22 1840  TROPONINIHS 601* 521*     Chemistry Recent Labs  Lab 08/21/22 1705 08/22/22 0412  NA 139 136  K 3.4* 3.7  CL 107 104  CO2 17* 21*  GLUCOSE 94 107*  BUN 65* 66*  CREATININE 2.73* 2.60*  CALCIUM 8.0* 8.5*  GFRNONAA 19* 21*  ANIONGAP 15 11    Recent Labs  Lab 08/22/22 0412  PROT 7.1  ALBUMIN 3.5  AST 464*  ALT 559*  ALKPHOS 118  BILITOT 1.5*   Lipids No results for input(s): "CHOL", "TRIG", "HDL", "LABVLDL", "LDLCALC", "CHOLHDL" in the last 168 hours.  Hematology Recent Labs  Lab 08/21/22 1705 08/22/22 0412  WBC 9.7 9.1  RBC 3.97 3.85*  HGB 10.6* 10.6*  HCT 36.0 34.5*  MCV 90.7 89.6  MCH 26.7 27.5  MCHC 29.4* 30.7  RDW 17.8* 17.9*  PLT 227 192   Thyroid  No results for input(s): "TSH", "FREET4" in the last 168 hours.  BNP Recent Labs  Lab 08/21/22 1705  BNP >4,500.0*    DDimer No results for input(s): "DDIMER" in the last 168 hours.   Radiology/Studies:  CT ABDOMEN PELVIS WO CONTRAST  Result Date: 08/22/2022 CLINICAL DATA:  Abdominal distension EXAM: CT ABDOMEN AND PELVIS WITHOUT CONTRAST TECHNIQUE: Multidetector CT imaging of the abdomen and pelvis was performed following the standard protocol without IV contrast. RADIATION DOSE REDUCTION: This exam was performed according to the departmental dose-optimization program which includes automated exposure control, adjustment of the mA and/or kV according to patient size and/or use of iterative reconstruction technique. COMPARISON:  06/04/2021 FINDINGS: Lower chest: Cardiomegaly. Small bilateral  pleural effusions. Coronary artery and aortic calcifications. Hepatobiliary: No focal hepatic abnormality. Gallbladder unremarkable. Pancreas: No focal abnormality or ductal dilatation. Spleen: No focal abnormality.  Normal size. Adrenals/Urinary Tract: Left retroperitoneal mass again noted in the region surgical clips in the left retroperitoneum adjacent to the left kidney measuring 5.5 x 3.2 cm compared to up to 5.6 cm previously. Left kidney is atrophic. No renal stones or hydronephrosis. Renal vascular calcifications noted. Urinary bladder decompressed, grossly unremarkable. Stomach/Bowel: Scattered sigmoid diverticula. No active diverticulitis. Stomach and small bowel decompressed, grossly unremarkable. No bowel obstruction. Vascular/Lymphatic: Heavily calcified aorta, iliac vessels and branch vessels. No evidence of aneurysm or adenopathy. Reproductive: Uterus and adnexa unremarkable.  No mass. Other: Large volume ascites in the abdomen and pelvis. Musculoskeletal: No acute bony abnormality. IMPRESSION: Large volume ascites in the abdomen or pelvis. Small bilateral pleural effusions. Cardiomegaly, coronary  artery disease. Left retroperitoneal mass adjacent to surgical clips superior to the left kidney. This is not significantly changed. Scattered sigmoid diverticulosis. Diffuse aortoiliac and branch vessel atherosclerosis. Electronically Signed   By: Rolm Baptise M.D.   On: 08/22/2022 01:03   DG Chest 2 View  Result Date: 08/21/2022 CLINICAL DATA:  Chest pain. EXAM: CHEST - 2 VIEW COMPARISON:  Abdominal series 07/04/2021 FINDINGS: The heart is moderately enlarged, unchanged. There is no focal lung consolidation, pleural effusion or pneumothorax. No acute fractures are seen. There are atherosclerotic calcifications of the aorta. IMPRESSION: 1. Stable moderate cardiomegaly. 2. No acute cardiopulmonary process. Electronically Signed   By: Ronney Asters M.D.   On: 08/21/2022 17:42     Assessment and Plan:   Abdominal swelling - unclear etiology of abdominal distention - CTA abdomen/pelvis showed large volume ascitics, small bilateral pleural effusion, left retroperitoneal mass - AST 464/ALT 559 - may need RUQ [place] - repeat echo - s/p lasix in the ER - may need paracentesis  ICM Acute on chronic systolic and diastolic heart failure - PTA torsemide '20mg'$  PRN, patient had not been taking this for the last few days - BNP>4500 - Echo from 02/2021 showed LVEF 40-45%, global HK, G2DD, severely reduced RV function, severe MR and mld to mod MS, mod to severe TR, mild AI, severe pulmonary HTN - repeat echo as above - IV lasix '60mg'$  x1 in the ER - may nee paracentesis as above - kidney function complicating diuretic use. Suspect she may need further diuresis  Elevated troponin CAD with CTO RCA - No chest pain reported - HS troponin elevated to 600 with flat trend - suspect demand ischemia - echo as above - continue BB therapy  Severe MR and moderate MS - not felt to be surgical candidate for Mitraclip, also patient has denied work-up in the past - repeat echo as above  AKI on CKD - Scr 2.73, BUN  65 on arrival - mildly improved today - Scr 1.13 a year ago  [place] - she is followed by Palliative Care  For questions or updates, please contact [place] Please consult [email or web address] for contact info under    Signed, Kylan Liberati Ninfa Meeker, PA-C  08/22/2022 7:37 AM

## 2022-08-22 NOTE — Progress Notes (Signed)
*  PRELIMINARY RESULTS* Echocardiogram 2D Echocardiogram has been performed.  Anita [name] 08/22/2022, 1:37 PM

## 2022-08-22 NOTE — Progress Notes (Signed)
[facility] Progress Note  Patient: Anita [name]    UUV:[id number]  DOA: 08/21/2022    Date of Service: the patient was seen and examined on 08/22/2022  Brief hospital course: 61 year old female with past medical history of multiple sclerosis and systolic/diastolic heart failure with moderate to severe tricuspid regurg and severe mitral regurg presented to the emergency room on 9/14 with complaints of increased facial and abdominal swelling for the past few weeks.  Patient is on diuretics, but has not been taking them as of late.  In the emergency room, patient found to have acute kidney injury with a creatinine of 2.73, BNP of greater than 4500, troponin of 600 and she was given a dose of Lasix in the emergency room.  Cardiology and nephrology consulted. To date, patient has diuresed 600 cc of fluid.  Assessment and Plan: Assessment and Plan: Acute on chronic combined systolic and diastolic CHF (congestive heart failure) [facility]) Cardiology following.  Echocardiogram done 9/15 notes ejection fraction of 30 to 35% with severe tricuspid and mitral valve regurg.  Global hypokinesis.  Indeterminate diastolic function.  This looks to be worse than previous echocardiogram done 15 months ago.  Has diuresed over 600 cc of fluid to date  Anasarca Secondary to CHF.  Right ventricular failure causing transaminitis and overall hepatic congestion.  As above.  Acute kidney injury superimposed on stage IIIa chronic kidney disease ([place]) Baseline creatinine around 1.13.  Nephrology consulted.  On admission with creatinine 2.73 likely from decompensated heart failure.  Creatinine today improved to 2.6.  COPD (chronic obstructive pulmonary disease) (HCC) Stable, denies any shortness of breath  Essential hypertension Elevated diastolic blood pressures in part due to right ventricular failure.  Numbers improved with diuresis.  Elevated troponin Demand ischemia versus elevations due to renal failure  and heart failure.  Cardiology following.  Continue to cycle.  Patient denies any chest pain at this time.  Multiple sclerosis ([place]) Stable at this time.  Not on any medications.  Coronary artery disease involving native coronary artery of native heart without angina pectoris Given decrease in ejection fraction, cardiology discussed with patient about whether or not she wants ischemic work-up.  Tobacco abuse Have provided counseling. Nicotine patch.        Body mass index is 24.68 kg/m.        Consultants: Cardiology Nephrology  Procedures: Echocardiogram noting worsening ejection fraction of 30 to 35%, severe mitral and tricuspid regurg, severe pulmonary hypertension and indeterminate diastolic function  Antimicrobials: None  Code Status: Full code   Subjective: Patient states swelling a little bit improved.  Denies any shortness of breath  Objective: Vital signs were reviewed and unremarkable. Vitals:   08/22/22 0236 08/22/22 1614  BP: 114/82 106/81  Pulse: 82 74  Resp: 17 16  Temp: 97.7 F (36.5 C) 97.7 F (36.5 C)  SpO2: 100% 100%    Intake/Output Summary (Last 24 hours) at 08/22/2022 1818 Last data filed at 08/22/2022 1740 Gross per 24 hour  Intake 240 ml  Output 600 ml  Net -360 ml   Filed Weights   08/21/22 1658 08/22/22 0236  Weight: 61.2 kg 63.2 kg   Body mass index is 24.68 kg/m.  Exam:  General: Alert and oriented x3, no acute distress HEENT: Normocephalic, atraumatic, face appears swollen Cardiovascular: Regular rate and rhythm, S1-S2, 2 out of 6 systolic ejection murmur Respiratory: Decreased breath sounds throughout Abdomen: Soft, nontender, distended, hypoactive bowel sounds Musculoskeletal: No clubbing or cyanosis, trace pitting edema Skin: No  skin breaks, tears or lesions Psychiatry: Appropriate, no evidence of psychoses Neurology: No focal deficits  Data Reviewed: Improving renal function  Disposition:  Status is:  Inpatient Remains inpatient appropriate because:  -Further diuresis -Decision on ischemic work-up    Anticipated discharge date: 9/18   Family Communication: Left message for daughter DVT Prophylaxis:   Lovenox    Author: Annita Brod ,MD 08/22/2022 6:18 PM  To reach On-call, see care teams to locate the attending and reach out via [email or web address]. Between 7PM-7AM, please contact night-coverage If you still have difficulty reaching the attending provider, please page the [facility] (Director on Call) for [facility] on amion for assistance.

## 2022-08-23 DIAGNOSIS — N179 Acute kidney failure, unspecified: Secondary | ICD-10-CM | POA: Diagnosis not present

## 2022-08-23 DIAGNOSIS — R601 Generalized edema: Secondary | ICD-10-CM | POA: Diagnosis not present

## 2022-08-23 DIAGNOSIS — R7401 Elevation of levels of liver transaminase levels: Secondary | ICD-10-CM | POA: Diagnosis present

## 2022-08-23 DIAGNOSIS — R778 Other specified abnormalities of plasma proteins: Secondary | ICD-10-CM | POA: Diagnosis not present

## 2022-08-23 DIAGNOSIS — N189 Chronic kidney disease, unspecified: Secondary | ICD-10-CM

## 2022-08-23 DIAGNOSIS — I5043 Acute on chronic combined systolic (congestive) and diastolic (congestive) heart failure: Secondary | ICD-10-CM | POA: Diagnosis not present

## 2022-08-23 LAB — BASIC METABOLIC PANEL
Anion gap: 12 (ref 5–15)
BUN: 63 mg/dL — ABNORMAL HIGH (ref 6–20)
CO2: 20 mmol/L — ABNORMAL LOW (ref 22–32)
Calcium: 8.8 mg/dL — ABNORMAL LOW (ref 8.9–10.3)
Chloride: 102 mmol/L (ref 98–111)
Creatinine, Ser: 2.26 mg/dL — ABNORMAL HIGH (ref 0.44–1.00)
GFR, Estimated: 24 mL/min — ABNORMAL LOW (ref >60)
Glucose, Bld: 95 mg/dL (ref 70–99)
Potassium: 4.2 mmol/L (ref 3.5–5.1)
Sodium: 134 mmol/L — ABNORMAL LOW (ref 135–145)

## 2022-08-23 LAB — HEPATIC FUNCTION PANEL
ALT: 482 U/L — ABNORMAL HIGH (ref 0–44)
AST: 326 U/L — ABNORMAL HIGH (ref 15–41)
Albumin: 3.5 g/dL (ref 3.5–5.0)
Alkaline Phosphatase: 116 U/L (ref 38–126)
Bilirubin, Direct: 0.6 mg/dL — ABNORMAL HIGH (ref 0.0–0.2)
Indirect Bilirubin: 1.1 mg/dL — ABNORMAL HIGH (ref 0.3–0.9)
Total Bilirubin: 1.7 mg/dL — ABNORMAL HIGH (ref 0.3–1.2)
Total Protein: 7.3 g/dL (ref 6.5–8.1)

## 2022-08-23 LAB — TROPONIN I (HIGH SENSITIVITY): Troponin I (High Sensitivity): 122 ng/L (ref <18)

## 2022-08-23 MED ORDER — FUROSEMIDE 10 MG/ML IJ SOLN
40.0000 mg | Freq: Two times a day (BID) | INTRAMUSCULAR | Status: DC
  Administered 2022-08-23: 40 mg via INTRAVENOUS
  Filled 2022-08-23: qty 4

## 2022-08-23 NOTE — Progress Notes (Signed)
Rounding Note    Patient Name: Anita [name] Date of Encounter: 08/23/2022  [facility] Cardiologist: Buford Dresser, MD   Subjective   Patient reports she is feeling OK, still with 2-3 L O2. NO chest pain. Abdomen still distended. UOP. Kidney function improving.   Inpatient Medications    Scheduled Meds:  enoxaparin (LOVENOX) injection  30 mg Subcutaneous Q24H   feeding supplement  237 mL Oral BID BM   ferrous sulfate  325 mg Oral BID WC   fluticasone furoate-vilanterol  1 puff Inhalation Daily   furosemide  20 mg Intravenous BID   metoprolol succinate  25 mg Oral Daily   pantoprazole (PROTONIX) IV  40 mg Intravenous Q12H   sodium chloride flush  3 mL Intravenous Q12H   sodium chloride flush  3 mL Intravenous Q12H   Continuous Infusions:  sodium chloride     PRN Meds: sodium chloride, acetaminophen **OR** acetaminophen, albuterol, ondansetron (ZOFRAN) IV, sodium chloride flush, traMADol   Vital Signs    Vitals:   08/22/22 1614 08/22/22 1955 08/23/22 0328 08/23/22 0415  BP: 106/81 118/81 (!) 122/93   Pulse: 74 79 74   Resp: '16 18 18   '$ Temp: 97.7 F (36.5 C) 97.6 F (36.4 C) 97.8 F (36.6 C)   TempSrc: Oral     SpO2: 100% 97% 100%   Weight:    64 kg  Height:        Intake/Output Summary (Last 24 hours) at 08/23/2022 0658 Last data filed at 08/23/2022 0400 Gross per 24 hour  Intake 240 ml  Output 1050 ml  Net -810 ml      08/23/2022    4:15 AM 08/22/2022    2:36 AM 08/21/2022    4:58 PM  Last 3 Weights  Weight (lbs) 141 lb 1.5 oz 139 lb 5.3 oz 135 lb  Weight (kg) 64 kg 63.2 kg 61.236 kg      Telemetry    NSR HR 60-70 - Personally Reviewed  ECG    No new - Personally Reviewed  Physical Exam   GEN: No acute distress.   Neck: No JVD Cardiac: RRR, + murmur, no rubs, or gallops.  Respiratory:diminished breath sounds bilaterally. GI: Soft, nontender, non-distended  MS: No edema; No deformity. Neuro:  Nonfocal  Psych:  Normal affect   Labs    High Sensitivity Troponin:   Recent Labs  Lab 08/21/22 1705 08/21/22 1840 08/23/22 0534  TROPONINIHS 601* 521* 122*     Chemistry Recent Labs  Lab 08/21/22 1705 08/22/22 0412 08/23/22 0534  NA 139 136 134*  K 3.4* 3.7 4.2  CL 107 104 102  CO2 17* 21* 20*  GLUCOSE 94 107* 95  BUN 65* 66* 63*  CREATININE 2.73* 2.60* 2.26*  CALCIUM 8.0* 8.5* 8.8*  PROT  --  7.1  --   ALBUMIN  --  3.5  --   AST  --  464*  --   ALT  --  559*  --   ALKPHOS  --  118  --   BILITOT  --  1.5*  --   GFRNONAA 19* 21* 24*  ANIONGAP '15 11 12    '$ Lipids No results for input(s): "CHOL", "TRIG", "HDL", "LABVLDL", "LDLCALC", "CHOLHDL" in the last 168 hours.  Hematology Recent Labs  Lab 08/21/22 1705 08/22/22 0412  WBC 9.7 9.1  RBC 3.97 3.85*  HGB 10.6* 10.6*  HCT 36.0 34.5*  MCV 90.7 89.6  MCH 26.7 27.5  MCHC 29.4* 30.7  RDW 17.8* 17.9*  PLT 227 192   Thyroid No results for input(s): "TSH", "FREET4" in the last 168 hours.  BNP Recent Labs  Lab 08/21/22 1705  BNP >4,500.0*    DDimer No results for input(s): "DDIMER" in the last 168 hours.   Radiology    ECHOCARDIOGRAM COMPLETE  Result Date: 08/22/2022    ECHOCARDIOGRAM REPORT   Patient Name:   Anita [name] Date of Exam: 08/22/2022 Medical Rec #:  [id number]             Height:       63.0 in Accession #:    [id number]            Weight:       139.3 lb Date of Birth:  [birth year]             BSA:          1.658 m Patient Age:    61 years              BP:           134/89 mmHg Patient Gender: F                     HR:           85 bpm. Exam Location:  [facility] Procedure: 2D Echo, Color Doppler and Cardiac Doppler Indications:     Dyspnea R06.00  History:         Patient has prior history of Echocardiogram examinations, most                  recent 02/07/2021. CHF; COPD. Severe Mitral regurgitation.  Sonographer:     Sherrie Sport Referring Phys:  Tarri Glenn Diagnosing Phys: Ida Rogue MD  Sonographer Comments:  Technically challenging study due to limited acoustic windows, no subcostal window and suboptimal apical window. IMPRESSIONS  1. Left ventricular ejection fraction, by estimation, is 30 to 35%. The left ventricle has moderately decreased function. The left ventricle demonstrates global hypokinesis. There is mild left ventricular hypertrophy. Left ventricular diastolic parameters are indeterminate.  2. Right ventricular systolic function is severely reduced. The right ventricular size is moderately enlarged. Tricuspid regurgitation signal is inadequate for assessing PA pressure.  3. Right atrial size was moderately dilated.  4. The mitral valve is normal in structure. Severe mitral valve regurgitation. No evidence of mitral stenosis.  5. Tricuspid valve regurgitation is severe.  6. The aortic valve is normal in structure. Aortic valve regurgitation is mild. Aortic valve sclerosis/calcification is present, without any evidence of aortic stenosis.  7. The inferior vena cava is normal in size with greater than 50% respiratory variability, suggesting right atrial pressure of 3 mmHg. FINDINGS  Left Ventricle: Left ventricular ejection fraction, by estimation, is 30 to 35%. The left ventricle has moderately decreased function. The left ventricle demonstrates global hypokinesis. The left ventricular internal cavity size was normal in size. There is mild left ventricular hypertrophy. Left ventricular diastolic parameters are indeterminate. Right Ventricle: The right ventricular size is moderately enlarged. No increase in right ventricular wall thickness. Right ventricular systolic function is severely reduced. Tricuspid regurgitation signal is inadequate for assessing PA pressure. Left Atrium: Left atrial size was normal in size. Right Atrium: Right atrial size was moderately dilated. Pericardium: There is no evidence of pericardial effusion. Mitral Valve: The mitral valve is normal in structure. Severe mitral valve  regurgitation. No evidence of mitral valve stenosis. Tricuspid Valve: The tricuspid valve is  normal in structure. Tricuspid valve regurgitation is severe. No evidence of tricuspid stenosis. Aortic Valve: The aortic valve is normal in structure. Aortic valve regurgitation is mild. Aortic valve sclerosis/calcification is present, without any evidence of aortic stenosis. Aortic valve mean gradient measures 2.5 mmHg. Aortic valve peak gradient measures 4.5 mmHg. Aortic valve area, by VTI measures 1.60 cm. Pulmonic Valve: The pulmonic valve was normal in structure. Pulmonic valve regurgitation is not visualized. No evidence of pulmonic stenosis. Aorta: The aortic root is normal in size and structure. Venous: The inferior vena cava is normal in size with greater than 50% respiratory variability, suggesting right atrial pressure of 3 mmHg. IAS/Shunts: No atrial level shunt detected by color flow Doppler. Additional Comments: There is a small pleural effusion in the left lateral region.  LEFT VENTRICLE PLAX 2D LVIDd:         4.30 cm      Diastology LVIDs:         3.90 cm      LV e' medial:    6.42 cm/s LV PW:         1.20 cm      LV E/e' medial:  20.6 LV IVS:        1.30 cm      LV e' lateral:   10.10 cm/s LVOT diam:     2.00 cm      LV E/e' lateral: 13.1 LV SV:         28 LV SV Index:   17 LVOT Area:     3.14 cm  LV Volumes (MOD) LV vol d, MOD A2C: 144.0 ml LV vol d, MOD A4C: 173.0 ml LV vol s, MOD A2C: 104.0 ml LV vol s, MOD A4C: 113.0 ml LV SV MOD A2C:     40.0 ml LV SV MOD A4C:     173.0 ml LV SV MOD BP:      58.0 ml RIGHT VENTRICLE RV Basal diam:  3.80 cm RV Mid diam:    3.60 cm RV S prime:     5.87 cm/s TAPSE (M-mode): 2.1 cm LEFT ATRIUM             Index        RIGHT ATRIUM           Index LA diam:        3.90 cm 2.35 cm/m   RA Area:     31.50 cm LA Vol (A2C):   95.5 ml 57.59 ml/m  RA Volume:   116.00 ml 69.95 ml/m LA Vol (A4C):   56.3 ml 33.95 ml/m LA Biplane Vol: 73.2 ml 44.14 ml/m  AORTIC VALVE AV Area  (Vmax):    1.55 cm AV Area (Vmean):   1.37 cm AV Area (VTI):     1.60 cm AV Vmax:           105.50 cm/s AV Vmean:          72.200 cm/s AV VTI:            0.174 m AV Peak Grad:      4.5 mmHg AV Mean Grad:      2.5 mmHg LVOT Vmax:         51.90 cm/s LVOT Vmean:        31.400 cm/s LVOT VTI:          0.089 m LVOT/AV VTI ratio: 0.51  AORTA Ao Root diam: 3.07 cm MITRAL VALVE  TRICUSPID VALVE MV Area (PHT): 3.34 cm     TR Peak grad:   10.5 mmHg MV Decel Time: 227 msec     TR Vmax:        162.00 cm/s MV E velocity: 132.00 cm/s MV A velocity: 83.80 cm/s   SHUNTS MV E/A ratio:  1.58         Systemic VTI:  0.09 m                             Systemic Diam: 2.00 cm Ida Rogue MD Electronically signed by Ida Rogue MD Signature Date/Time: 08/22/2022/3:35:47 PM    Final    US RENAL  Result Date: 08/22/2022 CLINICAL DATA:  Left retroperitoneal mass seen on CT. EXAM: RENAL / URINARY TRACT ULTRASOUND COMPLETE COMPARISON:  CT abdomen pelvis from same day. CT chest dated March 20, 2020. PET-CT dated March 19, 2006. FINDINGS: Right Kidney: Renal measurements: 9.8 x 5.1 x 5.3 cm = volume: 139 mL. Increased echogenicity. No mass or hydronephrosis visualized. Left Kidney: Renal measurements: 6.5 x 3.7 x 3.3 cm = volume: 41 mL. Increased echogenicity. No mass or hydronephrosis visualized. Bladder: Appears normal for degree of bladder distention. Other: Incidentally noted left retroperitoneal mass superior and medial to the left kidney, measuring 4.0 x 3.3 x 3.7 cm. Large ascites. IMPRESSION: 1. Left retroperitoneal mass, stable in size since 2021, and decreased in size since 2007. This was biopsied in 2007, pathology consistent with ganglioneuroma. 2. No renal mass. Left renal atrophy and bilateral renal increased echogenicity, consistent with medical renal disease. 3. Unchanged large ascites. Electronically Signed   By: Titus Dubin M.D.   On: 08/22/2022 12:30   CT ABDOMEN PELVIS WO CONTRAST  Result  Date: 08/22/2022 CLINICAL DATA:  Abdominal distension EXAM: CT ABDOMEN AND PELVIS WITHOUT CONTRAST TECHNIQUE: Multidetector CT imaging of the abdomen and pelvis was performed following the standard protocol without IV contrast. RADIATION DOSE REDUCTION: This exam was performed according to the departmental dose-optimization program which includes automated exposure control, adjustment of the mA and/or kV according to patient size and/or use of iterative reconstruction technique. COMPARISON:  06/04/2021 FINDINGS: Lower chest: Cardiomegaly. Small bilateral pleural effusions. Coronary artery and aortic calcifications. Hepatobiliary: No focal hepatic abnormality. Gallbladder unremarkable. Pancreas: No focal abnormality or ductal dilatation. Spleen: No focal abnormality.  Normal size. Adrenals/Urinary Tract: Left retroperitoneal mass again noted in the region surgical clips in the left retroperitoneum adjacent to the left kidney measuring 5.5 x 3.2 cm compared to up to 5.6 cm previously. Left kidney is atrophic. No renal stones or hydronephrosis. Renal vascular calcifications noted. Urinary bladder decompressed, grossly unremarkable. Stomach/Bowel: Scattered sigmoid diverticula. No active diverticulitis. Stomach and small bowel decompressed, grossly unremarkable. No bowel obstruction. Vascular/Lymphatic: Heavily calcified aorta, iliac vessels and branch vessels. No evidence of aneurysm or adenopathy. Reproductive: Uterus and adnexa unremarkable.  No mass. Other: Large volume ascites in the abdomen and pelvis. Musculoskeletal: No acute bony abnormality. IMPRESSION: Large volume ascites in the abdomen or pelvis. Small bilateral pleural effusions. Cardiomegaly, coronary artery disease. Left retroperitoneal mass adjacent to surgical clips superior to the left kidney. This is not significantly changed. Scattered sigmoid diverticulosis. Diffuse aortoiliac and branch vessel atherosclerosis. Electronically Signed   By: Rolm Baptise M.D.   On: 08/22/2022 01:03   DG Chest 2 View  Result Date: 08/21/2022 CLINICAL DATA:  Chest pain. EXAM: CHEST - 2 VIEW COMPARISON:  Abdominal series 07/04/2021 FINDINGS: The heart is  moderately enlarged, unchanged. There is no focal lung consolidation, pleural effusion or pneumothorax. No acute fractures are seen. There are atherosclerotic calcifications of the aorta. IMPRESSION: 1. Stable moderate cardiomegaly. 2. No acute cardiopulmonary process. Electronically Signed   By: Ronney Asters M.D.   On: 08/21/2022 17:42    Cardiac Studies   Echo 3.3.22 1. Left ventricular ejection fraction, by estimation, is 40 to 45%. Left  ventricular ejection fraction by 3D volume is 47 %. The left ventricle has  mildly decreased function. The left ventricle demonstrates global  hypokinesis. Left ventricular diastolic   parameters are consistent with Grade II diastolic dysfunction  (pseudonormalization). Elevated left ventricular end-diastolic pressure.   2. Right ventricular systolic function is severely reduced. The right  ventricular size is mildly enlarged. There is severely elevated pulmonary  artery systolic pressure. The estimated right ventricular systolic  pressure is 27.0 mmHg.   3. Left atrial size was moderately dilated.   4. Right atrial size was severely dilated.   5. The mitral valve is degenerative with mild to moderate thickening of  the mitral valve leaflets. There is teathering of the MV leaflets due to  mild LV dysfunction. Severe mitral valve regurgitation. Mild to moderate  mitral stenosis. The mean mitral  valve gradient is 6.5 mmHg.   6. Tricuspid valve regurgitation is moderate to severe.   7. The aortic valve is tricuspid. Aortic valve regurgitation is mild.  Mild aortic valve sclerosis is present, with no evidence of aortic valve  stenosis. Aortic regurgitation PHT measures 499 msec.   8. The inferior vena cava is dilated in size with <50% respiratory  variability,  suggesting right atrial pressure of 15 mmHg.   9. There is a trivial pericardial effusion posterior to the left  ventricle.  10. Compared to prior echo, Mitral regurgitation appears worse and RV  dysfunction is now present. There is severe Pulmonary HTN.   Echo 03/20/20  1. Left ventricular ejection fraction, by estimation, is 40 to 45%. The  left ventricle has mildly decreased function. The left ventricle  demonstrates global hypokinesis. The left ventricular internal cavity size  was mildly dilated. Left ventricular  diastolic function could not be evaluated.   2. Right ventricular systolic function is normal. The right ventricular  size is normal. There is moderately elevated pulmonary artery systolic  pressure.   3. Left atrial size was mildly dilated.   4. Moderate mitral subvalvular thickening/fibrosis.   5. The mitral valve is rheumatic. Moderate to severe mitral valve  regurgitation. Mild mitral stenosis. The mean mitral valve gradient is 7.3  mmHg with average heart rate of 105 bpm.   6. The aortic valve is normal in structure. Aortic valve regurgitation is  not visualized.   7. The inferior vena cava is dilated in size with <50% respiratory  variability, suggesting right atrial pressure of 15 mmHg.   Cath 03/23/20 Hemodynamics: LV end diastolic pressure is moderately elevated. ----Coronary Angiography----- Ost RCA to Dist RCA lesion is 100% stenosed.-The PDA and PL system fills via faint collaterals from the AV groove LCx and LAD septals. Mid LAD-1 lesion is 60% stenosed. Mid LAD-2 lesion is 50% stenosed. Dist LAD lesion is 55% stenosed with 70% stenosed side branch in 2nd Diag. 1st Diag lesion is 70% stenosed. Ost Cx to Prox Cx lesion is 45% stenosed. Prox-MID Cx lesion is 65% stenosed.   MODERATE-SEVERE THREE-VESSEL CAD: 100% proximal RCA occlusion with left-to-right collaterals faintly filling PDA and PL system (AV groove LCx-PL and LAD  septal-PDA) Diffuse moderate mid  LAD disease with 60% to 50% stenosis. Tandem 50% and 65% proximal and mid LCx with the 65% lesion being the most significant lesion. Moderately elevated LVEDP of 18-20 mmHg   Given the extent of disease in the LAD and LCx, neither lesion is very amenable to PCI, and RCA is chronically occluded.  Given her comorbidities, I do not think she would be a good CABG candidate especially in light of the fact that she would likely require valve surgery as well.  She would not recover well.   Recommendations aggressive medical management.    Patient Profile     61 y.o. female with a hx of multiple sclerosis, CAD, severe MR with moderate mitral stenosis, COPD prior tobacco use who is being seen 08/22/2022 for the evaluation of CHF   Assessment & Plan    Ascites - in the setting if CHF with RV failure, mod to severe MR, and pulmonary HTN - CTA abdomen/pelvis showed large volume ascitics, small bilateral pleural effusion, left retroperitoneal mass - AST 464/ALT 559>improving - repeat echo showed worse LVEF 30-35% with severely reduced RVSF, severe MR - continue IV lasix   ICM Acute on chronic systolic and diastolic heart failure - PTA torsemide '20mg'$  PRN, patient had not been taking this for the last few days - BNP>4500 - Echo from 02/2021 showed LVEF 40-45%, global HK, G2DD, severely reduced RV function, severe MR and mld to mod MS, mod to severe TR, mild AI, severe pulmonary HTN - Echo this admission showed LVEF 30-35%, global HK, mid LVH, severely reduced RVSF, severe MR - IV lasix '60mg'$  x1 in the ER - continue IV lasix '20mg'$  BID, low UOP. Increase to '40mg'$  BID - kidney function improving   Elevated troponin CAD with CTO RCA - No chest pain reported - HS troponin elevated to 600 with flat trend - suspect demand ischemia - echo as above - continue BB therapy - echo showed reduced EF. Not a good cath candidate given AKI/CKD. Can consider Myoview Lexiscan   Severe MR and moderate MS - not felt  to be surgical candidate for Mitraclip, also patient has denied work-up in the past - repeat echo showed LVEF 30-35%, severe MR   AKI on CKD - Scr 2.73, BUN 65 on arrival - Scr 1.13 a year ago - improving with diuresis   GOC - she is followed by Palliative Care  For questions or updates, please contact [facility] Please consult [email or web address] for contact info under        Signed, Aram Domzalski Ninfa Meeker, PA-C  08/23/2022, 6:58 AM

## 2022-08-23 NOTE — Consult Note (Signed)
[facility]  CONSULT NOTE    Date: 08/23/2022                  Patient Name:  [name]  MRN: [id number]  DOB: 22-May-1961  Age / Sex: 61 y.o., female         PCP: Anita Koch, NP                 Service Requesting Consult: [place]                 Reason for Consult: Acute kidney injury            History of Present Illness: Anita [name] is a 61 y.o.  female with past medical conditions including gout, GERD, COPD, multiple sclerosis, CAD, systolic heart failure with EF 40 to 45%, who was admitted to [facility] on 08/21/2022 for Abdominal distension [R14.0] Elevated troponin [R77.8] Hypervolemia, unspecified hypervolemia type [E87.70]  Patient presents to the emergency department complaining of abdominal swelling that has progressed over the past 2 weeks.  Patient reports minor discomfort.  Patient states facial edema began a few days prior.  No reports of food allergies.  Denies shortness of breath.  Chart review states patient reported to the palliative care team that they had not taken diuretic therapy for increased edema.  Seen resting in bed, partially completed breakfast tray at bedside.  Remains on 2 L nasal cannula.  No lower extremity edema.  Labs on ED arrival concerning for serum bicarb 21, BUN 66, creatinine 2.60 with GFR 21, total bilirubin 1.5.  Elevated AST 464, ALT 559 Troponin 122.  CT abdomen chest shows large volume ascites with small bilateral pleural effusions.  A left retroperitoneal mass in the left kidney.  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  Echo completed on 08/22/2022 shows EF 30 to 35% with mild LVH with severe mitral valve regurgitation and aortic valve calcification.   Medications: Outpatient medications: Medications Prior to Admission  Medication Sig Dispense Refill Last Dose   albuterol (VENTOLIN HFA) 108 (90 Base) MCG/ACT inhaler INHALE 1-2 PUFFS  INTO THE LUNGS EVERY 6 (SIX) HOURS AS NEEDED FOR WHEEZING OR SHORTNESS OF BREATH. USE SPARINGLY! 18 each 0 prn   cetirizine (ZYRTEC) 10 MG tablet Take 10 mg by mouth daily.   prn   Doxylamine Succinate, Sleep, (SLEEP AID PO) Take 1 tablet by mouth at bedtime as needed (sleep).   prn   erythromycin ophthalmic ointment SMARTSIG:1 sparingly In Eye(s) 3 Times Daily   prn   fluticasone-salmeterol (ADVAIR DISKUS) 250-50 MCG/ACT AEPB INHALE 1 PUFF INTO THE LUNGS TWICE A DAY 60 each 5 Past Week   guaiFENesin (MUCINEX) 600 MG 12 hr tablet Take 600 mg by mouth 2 (two) times daily as needed for cough.   prn   Pediatric Multiple Vitamins (FLINTSTONES MULTIVITAMIN) CHEW Chew by mouth.   08/20/2022   Probiotic Product (PROBIOTIC ADVANCED) CAPS Take 1 capsule by mouth daily.   08/20/2022   torsemide (DEMADEX) 20 MG tablet Take for weight gain of 2 lbs in 1 day or 5 lbs in 1 week or obvious swelling. 30 tablet 11 Past Week   trolamine salicylate (ASPERCREME) 10 % cream Apply 1 application. topically daily as needed for muscle pain (back or joint pain).   prn   vitamin B-12 (CYANOCOBALAMIN) 500 MCG tablet Take 1,000 mcg by mouth daily. 4 times weekly no more then 2000 MCG  per week   Past Week   feeding supplement, ENSURE ENLIVE, (ENSURE ENLIVE) LIQD Take 237 mLs by mouth 2 (two) times daily between meals.      ferrous sulfate 325 (65 FE) MG EC tablet Take 1 tablet (325 mg total) by mouth 2 (two) times daily with a meal. 60 tablet 3 08/20/2022   metoprolol succinate (TOPROL-XL) 25 MG 24 hr tablet Take 1 tablet (25 mg total) by mouth daily. Take with or immediately following a meal. 90 tablet 3 08/20/2022   Multiple Vitamin (MULTIVITAMIN WITH MINERALS) TABS tablet Take 1 tablet by mouth daily. (Patient not taking: Reported on 08/22/2022)   Not Taking   nitroGLYCERIN (NITROSTAT) 0.4 MG SL tablet Place 1 tablet (0.4 mg total) under the tongue every 5 (five) minutes as needed for chest pain. 20 tablet 5    omeprazole  (PRILOSEC) 40 MG capsule Take 1 capsule (40 mg total) by mouth daily. 90 capsule 3 08/20/2022    Current medications: Current Facility-Administered Medications  Medication Dose Route Frequency Provider Last Rate Last Admin   0.9 %  sodium chloride infusion  250 mL Intravenous PRN Para Skeans, MD       acetaminophen (TYLENOL) tablet 650 mg  650 mg Oral Q6H PRN Annita Brod, MD       Or   acetaminophen (TYLENOL) suppository 650 mg  650 mg Rectal Q6H PRN Annita Brod, MD       albuterol (PROVENTIL) (2.5 MG/3ML) 0.083% nebulizer solution 2.5 mg  2.5 mg Inhalation Q6H PRN Para Skeans, MD       enoxaparin (LOVENOX) injection 30 mg  30 mg Subcutaneous Q24H Oswald Hillock, RPH   30 mg at 08/22/22 2122   feeding supplement (ENSURE ENLIVE / ENSURE PLUS) liquid 237 mL  237 mL Oral BID BM Florina Ou V, MD   237 mL at 08/23/22 1127   ferrous sulfate tablet 325 mg  325 mg Oral BID WC Para Skeans, MD   325 mg at 08/23/22 1119   fluticasone furoate-vilanterol (BREO ELLIPTA) 200-25 MCG/ACT 1 puff  1 puff Inhalation Daily Florina Ou V, MD   1 puff at 08/23/22 0830   furosemide (LASIX) injection 40 mg  40 mg Intravenous BID Furth, Cadence H, PA-C       metoprolol succinate (TOPROL-XL) 24 hr tablet 25 mg  25 mg Oral Daily Florina Ou V, MD   25 mg at 08/23/22 1119   ondansetron (ZOFRAN) injection 4 mg  4 mg Intravenous Q6H PRN Sharion Settler, NP   4 mg at 08/22/22 2122   pantoprazole (PROTONIX) injection 40 mg  40 mg Intravenous Q12H Florina Ou V, MD   40 mg at 08/23/22 1122   sodium chloride flush (NS) 0.9 % injection 3 mL  3 mL Intravenous Q12H Florina Ou V, MD   3 mL at 08/23/22 1127   sodium chloride flush (NS) 0.9 % injection 3 mL  3 mL Intravenous Q12H Florina Ou V, MD   3 mL at 08/23/22 1127   sodium chloride flush (NS) 0.9 % injection 3 mL  3 mL Intravenous PRN Para Skeans, MD   3 mL at 08/23/22 1126   traMADol (ULTRAM) tablet 50 mg  50 mg Oral Q6H PRN Annita Brod, MD   50  mg at 08/23/22 1132      Allergies: Allergies  Allergen Reactions   Acthar Hp [Corticotropin] Other (See Comments)    Throat swelling and coughing up blood  Codeine    Prednisone       Past Medical History: Past Medical History:  Diagnosis Date   Abnormality of gait 07/26/2013   Acute respiratory failure with hypoxia (HCC) 03/20/2020   Allergy    Arthritis    Atypical pneumonia 03/22/2020   CAD (coronary artery disease)    a. 03/2020 Cath: LM nl, LAD 60/37m 55d, D1 70, D2 70, LCX 45ost/p, 65p, RCA 100ost CTO. RPDA fills via collats from dLAD. Inf septal fills via collats from 1st septal, RPAV fills via collats from LPAV, RPL1/2 sev dzs-->med rx.   Chickenpox    Chronic combined systolic (congestive) and diastolic (congestive) heart failure (HFort [place]    a. 02/2021 Echo: EF 40-45%, glob HK, gr2 DD. Sev red RV fxn, RVSP 69.21mg. Mod dil LA, sev dil RA. Sev MR. Mild to mod MS. Mean MV grad 6.40m76m. Mod-Sev TR. Mild AI. Mild Ao sclerosis w/o stenosis.   COPD (chronic obstructive pulmonary disease) (HCC)    Elevated troponin 03/22/2020   GERD (gastroesophageal reflux disease)    Headache(784.0)    Migraine   Hearing loss    Right ear secondary to infection   History of shingles    Ischemic cardiomyopathy    a. 02/2021 Echo: EF 40-45%, glob HK.   Migraines    Multiple sclerosis (HCC)    Obese    Optic neuritis    PAH (pulmonary artery hypertension) ([place]CC[place]  a. 02/2021 Echo: RVSP 69.2mm49m   Prolonged QT interval 03/20/2020   Severe mitral regurgitation    a. 02/2021 Echo: Severe MR w/ mild to mod MS.  Mean MV grad 6.40mmH71m    Past Surgical History: Past Surgical History:  Procedure Laterality Date   COLONOSCOPY WITH PROPOFOL N/A 08/03/2020   Procedure: COLONOSCOPY WITH PROPOFOL;  Surgeon: Anna,[name]  [facility];  Service: Gastroenterology;  Laterality: N/A;   Ganglioneuroma     Resection   LEFT HEART CATH AND CORONARY ANGIOGRAPHY N/A 03/23/2020    Procedure: LEFT HEART CATH AND CORONARY ANGIOGRAPHY;  Surgeon: HardiLeonie Man  [facility];  Service: Cardiovascular;  Laterality: N/A;   PILONIDAL CYST EXCISION     PILONIDAL CYST EXCISION  1983   TUMOR REMOVAL  2007/2008     Family History: Family History  Problem Relation Age of Onset   Skin cancer Mother    Lung cancer Father    Uterine cancer Sister    Heart disease Brother    Bipolar disorder Sister    Throat cancer Paternal Grandmother      Social History: Social History   Socioeconomic History   Marital status: Divorced    Spouse name: Not on file   Number of children: 2   Years of education: Not on file   Highest education level: Not on file  Occupational History   Not on file  Tobacco Use   Smoking status: Former    Packs/day: 1.00    Types: Cigarettes   Smokeless tobacco: Never  Vaping Use   Vaping Use: Never used  Substance and Sexual Activity   Alcohol use: Yes    Comment: Occasional   Drug use: Never   Sexual activity: Not on file  Other Topics Concern   Not on file  Social History Narrative   Right handed    Lives in one story home   Drinks Caffeine - Sweet Tea    Social Determinants of Health   Financial Resource Strain: Low Risk  (  07/08/2022)   Overall Financial Resource Strain (CARDIA)    Difficulty of Paying Living Expenses: Not hard at all  Food Insecurity: No Food Insecurity (08/22/2022)   Hunger Vital Sign    Worried About Running Out of Food in the Last Year: Never true    Ran Out of Food in the Last Year: Never true  Transportation Needs: No Transportation Needs (08/22/2022)   PRAPARE - Hydrologist (Medical): No    Lack of Transportation (Non-Medical): No  Physical Activity: Inactive (07/08/2022)   Exercise Vital Sign    Days of Exercise per Week: 0 days    Minutes of Exercise per Session: 0 min  Stress: No Stress Concern Present (07/08/2022)   [place]    Feeling of Stress : Not at all  Social Connections: Socially Isolated (07/08/2022)   Social Connection and Isolation Panel [NHANES]    Frequency of Communication with Friends and Family: Three times a week    Frequency of Social Gatherings with Friends and Family: Never    Attends Religious Services: Never    Marine scientist or Organizations: No    Attends Archivist Meetings: Never    Marital Status: Divorced  Human resources officer Violence: Not At Risk (08/22/2022)   Humiliation, Afraid, Rape, and Kick questionnaire    Fear of Current or Ex-Partner: No    Emotionally Abused: No    Physically Abused: No    Sexually Abused: No     Review of Systems: Review of Systems  Constitutional:  Negative for chills, fever and malaise/fatigue.  HENT:  Negative for congestion, sore throat and tinnitus.   Eyes:  Negative for blurred vision and redness.  Respiratory:  Negative for cough, shortness of breath and wheezing.   Cardiovascular:  Negative for chest pain, palpitations, claudication and leg swelling.  Gastrointestinal:  Negative for abdominal pain, blood in stool, diarrhea, nausea and vomiting.       Abdominal distention  Genitourinary:  Negative for flank pain, frequency and hematuria.  Musculoskeletal:  Negative for back pain, falls and myalgias.  Skin:  Negative for rash.  Neurological:  Negative for dizziness, weakness and headaches.  Endo/Heme/Allergies:  Does not bruise/bleed easily.  Psychiatric/Behavioral:  Negative for depression. The patient is not nervous/anxious and does not have insomnia.     Vital Signs: Blood pressure 112/75, pulse 67, temperature 97.8 F (36.6 C), resp. rate 16, height '5\' 3"'$  (1.6 m), weight 64 kg, SpO2 97 %.  Weight trends: Filed Weights   08/21/22 1658 08/22/22 0236 08/23/22 0415  Weight: 61.2 kg 63.2 kg 64 kg    Physical Exam: General: NAD  Head: Normocephalic, atraumatic. Moist oral  mucosal membranes  Eyes: Anicteric  Lungs:  Diminished in bases, normal effort, [place] O2  Heart: Regular rate and rhythm  Abdomen:  Soft, nontender, moderate distention  Extremities: No peripheral edema.  Neurologic: Nonfocal, moving all four extremities  Skin: No lesions  Access: None     Lab results: Basic Metabolic Panel: Recent Labs  Lab 08/21/22 1705 08/22/22 0412 08/23/22 0534  NA 139 136 134*  K 3.4* 3.7 4.2  CL 107 104 102  CO2 17* 21* 20*  GLUCOSE 94 107* 95  BUN 65* 66* 63*  CREATININE 2.73* 2.60* 2.26*  CALCIUM 8.0* 8.5* 8.8*    Liver Function Tests: Recent Labs  Lab 08/22/22 0412 08/23/22 0534  AST 464* 326*  ALT 559* 482*  ALKPHOS  118 116  BILITOT 1.5* 1.7*  PROT 7.1 7.3  ALBUMIN 3.5 3.5   No results for input(s): "LIPASE", "AMYLASE" in the last 168 hours. No results for input(s): "AMMONIA" in the last 168 hours.  CBC: Recent Labs  Lab 08/21/22 1705 08/22/22 0412  WBC 9.7 9.1  HGB 10.6* 10.6*  HCT 36.0 34.5*  MCV 90.7 89.6  PLT 227 192    Cardiac Enzymes: No results for input(s): "CKTOTAL", "CKMB", "CKMBINDEX", "TROPONINI" in the last 168 hours.  BNP: Invalid input(s): "POCBNP"  CBG: No results for input(s): "GLUCAP" in the last 168 hours.  Microbiology: Results for orders placed or performed during the hospital encounter of 06/04/21  Urine Culture     Status: Abnormal   Collection Time: 06/04/21  2:56 PM   Specimen: Urine, Random  Result Value Ref Range Status   Specimen Description   Final    URINE, RANDOM Performed at [facility], [address], [place], [place] [address]    Special Requests   Final    NONE Performed at [facility], [address] [place], [place] [address]    Culture MULTIPLE SPECIES PRESENT, SUGGEST RECOLLECTION (A)  Final   Report Status 06/06/2021 FINAL  Final  Resp Panel by RT-PCR (Flu A&B, Covid) Nasopharyngeal Swab     Status: None   Collection Time: 06/04/21  8:23 PM    Specimen: Nasopharyngeal Swab; Nasopharyngeal(NP) swabs in vial transport medium  Result Value Ref Range Status   SARS Coronavirus 2 by RT PCR NEGATIVE NEGATIVE Final    Comment: (NOTE) SARS-CoV-2 target nucleic acids are NOT DETECTED.  The SARS-CoV-2 RNA is generally detectable in upper respiratory specimens during the acute phase of infection. The lowest concentration of SARS-CoV-2 viral copies this assay can detect is 138 copies/mL. A negative result does not preclude SARS-Cov-2 infection and should not be used as the sole basis for treatment or other patient management decisions. A negative result may occur with  improper specimen collection/handling, submission of specimen other than nasopharyngeal swab, presence of viral mutation(s) within the areas targeted by this assay, and inadequate number of viral copies(<138 copies/mL). A negative result must be combined with clinical observations, patient history, and epidemiological information. The expected result is Negative.  Fact Sheet for Patients:  EntrepreneurPulse.com.au  Fact Sheet for Healthcare Providers:  IncredibleEmployment.be  This test is no t yet approved or cleared by the [place] FDA and  has been authorized for detection and/or diagnosis of SARS-CoV-2 by FDA under an Emergency Use Authorization (EUA). This EUA will remain  in effect (meaning this test can be used) for the duration of the COVID-19 declaration under Section 564(b)(1) of the Act, 21 U.S.C.section 360bbb-3(b)(1), unless the authorization is terminated  or revoked sooner.       Influenza A by PCR NEGATIVE NEGATIVE Final   Influenza B by PCR NEGATIVE NEGATIVE Final    Comment: (NOTE) The Xpert Xpress SARS-CoV-2/FLU/RSV plus assay is intended as an aid in the diagnosis of influenza from Nasopharyngeal swab specimens and should not be used as a sole basis for treatment. Nasal washings and aspirates are  unacceptable for Xpert Xpress SARS-CoV-2/FLU/RSV testing.  Fact Sheet for Patients: EntrepreneurPulse.com.au  Fact Sheet for Healthcare Providers: IncredibleEmployment.be  This test is not yet approved or cleared by the [place] FDA and has been authorized for detection and/or diagnosis of SARS-CoV-2 by FDA under an Emergency Use Authorization (EUA). This EUA will remain in effect (meaning this test can be used)  for the duration of the COVID-19 declaration under Section 564(b)(1) of the Act, 21 U.S.C. section 360bbb-3(b)(1), unless the authorization is terminated or revoked.  Performed at [facility], [address] [place], [place] [address]     Coagulation Studies: No results for input(s): "LABPROT", "INR" in the last 72 hours.  Urinalysis: No results for input(s): "COLORURINE", "LABSPEC", "PHURINE", "GLUCOSEU", "HGBUR", "BILIRUBINUR", "KETONESUR", "PROTEINUR", "UROBILINOGEN", "NITRITE", "LEUKOCYTESUR" in the last 72 hours.  Invalid input(s): "APPERANCEUR"    Imaging: ECHOCARDIOGRAM COMPLETE  Result Date: 08/22/2022    ECHOCARDIOGRAM REPORT   Patient Name:   Anita [name] Date of Exam: 08/22/2022 Medical Rec #:  [id number]             Height:       63.0 in Accession #:    [id number]            Weight:       139.3 lb Date of Birth:  [birth year]             BSA:          1.658 m Patient Age:    58 years              BP:           134/89 mmHg Patient Gender: F                     HR:           85 bpm. Exam Location:  [facility] Procedure: 2D Echo, Color Doppler and Cardiac Doppler Indications:     Dyspnea R06.00  History:         Patient has prior history of Echocardiogram examinations, most                  recent 02/07/2021. CHF; COPD. Severe Mitral regurgitation.  Sonographer:     Sherrie Sport Referring Phys:  Tarri Glenn Diagnosing Phys: Ida Rogue MD  Sonographer Comments: Technically challenging study due to limited acoustic  windows, no subcostal window and suboptimal apical window. IMPRESSIONS  1. Left ventricular ejection fraction, by estimation, is 30 to 35%. The left ventricle has moderately decreased function. The left ventricle demonstrates global hypokinesis. There is mild left ventricular hypertrophy. Left ventricular diastolic parameters are indeterminate.  2. Right ventricular systolic function is severely reduced. The right ventricular size is moderately enlarged. Tricuspid regurgitation signal is inadequate for assessing PA pressure.  3. Right atrial size was moderately dilated.  4. The mitral valve is normal in structure. Severe mitral valve regurgitation. No evidence of mitral stenosis.  5. Tricuspid valve regurgitation is severe.  6. The aortic valve is normal in structure. Aortic valve regurgitation is mild. Aortic valve sclerosis/calcification is present, without any evidence of aortic stenosis.  7. The inferior vena cava is normal in size with greater than 50% respiratory variability, suggesting right atrial pressure of 3 mmHg. FINDINGS  Left Ventricle: Left ventricular ejection fraction, by estimation, is 30 to 35%. The left ventricle has moderately decreased function. The left ventricle demonstrates global hypokinesis. The left ventricular internal cavity size was normal in size. There is mild left ventricular hypertrophy. Left ventricular diastolic parameters are indeterminate. Right Ventricle: The right ventricular size is moderately enlarged. No increase in right ventricular wall thickness. Right ventricular systolic function is severely reduced. Tricuspid regurgitation signal is inadequate for assessing PA pressure. Left Atrium: Left atrial size was normal in size. Right Atrium: Right atrial size was moderately dilated. Pericardium: There  is no evidence of pericardial effusion. Mitral Valve: The mitral valve is normal in structure. Severe mitral valve regurgitation. No evidence of mitral valve stenosis. Tricuspid  Valve: The tricuspid valve is normal in structure. Tricuspid valve regurgitation is severe. No evidence of tricuspid stenosis. Aortic Valve: The aortic valve is normal in structure. Aortic valve regurgitation is mild. Aortic valve sclerosis/calcification is present, without any evidence of aortic stenosis. Aortic valve mean gradient measures 2.5 mmHg. Aortic valve peak gradient measures 4.5 mmHg. Aortic valve area, by VTI measures 1.60 cm. Pulmonic Valve: The pulmonic valve was normal in structure. Pulmonic valve regurgitation is not visualized. No evidence of pulmonic stenosis. Aorta: The aortic root is normal in size and structure. Venous: The inferior vena cava is normal in size with greater than 50% respiratory variability, suggesting right atrial pressure of 3 mmHg. IAS/Shunts: No atrial level shunt detected by color flow Doppler. Additional Comments: There is a small pleural effusion in the left lateral region.  LEFT VENTRICLE PLAX 2D LVIDd:         4.30 cm      Diastology LVIDs:         3.90 cm      LV e' medial:    6.42 cm/s LV PW:         1.20 cm      LV E/e' medial:  20.6 LV IVS:        1.30 cm      LV e' lateral:   10.10 cm/s LVOT diam:     2.00 cm      LV E/e' lateral: 13.1 LV SV:         28 LV SV Index:   17 LVOT Area:     3.14 cm  LV Volumes (MOD) LV vol d, MOD A2C: 144.0 ml LV vol d, MOD A4C: 173.0 ml LV vol s, MOD A2C: 104.0 ml LV vol s, MOD A4C: 113.0 ml LV SV MOD A2C:     40.0 ml LV SV MOD A4C:     173.0 ml LV SV MOD BP:      58.0 ml RIGHT VENTRICLE RV Basal diam:  3.80 cm RV Mid diam:    3.60 cm RV S prime:     5.87 cm/s TAPSE (M-mode): 2.1 cm LEFT ATRIUM             Index        RIGHT ATRIUM           Index LA diam:        3.90 cm 2.35 cm/m   RA Area:     31.50 cm LA Vol (A2C):   95.5 ml 57.59 ml/m  RA Volume:   116.00 ml 69.95 ml/m LA Vol (A4C):   56.3 ml 33.95 ml/m LA Biplane Vol: 73.2 ml 44.14 ml/m  AORTIC VALVE AV Area (Vmax):    1.55 cm AV Area (Vmean):   1.37 cm AV Area (VTI):      1.60 cm AV Vmax:           105.50 cm/s AV Vmean:          72.200 cm/s AV VTI:            0.174 m AV Peak Grad:      4.5 mmHg AV Mean Grad:      2.5 mmHg LVOT Vmax:         51.90 cm/s LVOT Vmean:        31.400 cm/s LVOT VTI:  0.089 m LVOT/AV VTI ratio: 0.51  AORTA Ao Root diam: 3.07 cm MITRAL VALVE                TRICUSPID VALVE MV Area (PHT): 3.34 cm     TR Peak grad:   10.5 mmHg MV Decel Time: 227 msec     TR Vmax:        162.00 cm/s MV E velocity: 132.00 cm/s MV A velocity: 83.80 cm/s   SHUNTS MV E/A ratio:  1.58         Systemic VTI:  0.09 m                             Systemic Diam: 2.00 cm Ida Rogue MD Electronically signed by Ida Rogue MD Signature Date/Time: 08/22/2022/3:35:47 PM    Final    US RENAL  Result Date: 08/22/2022 CLINICAL DATA:  Left retroperitoneal mass seen on CT. EXAM: RENAL / URINARY TRACT ULTRASOUND COMPLETE COMPARISON:  CT abdomen pelvis from same day. CT chest dated March 20, 2020. PET-CT dated March 19, 2006. FINDINGS: Right Kidney: Renal measurements: 9.8 x 5.1 x 5.3 cm = volume: 139 mL. Increased echogenicity. No mass or hydronephrosis visualized. Left Kidney: Renal measurements: 6.5 x 3.7 x 3.3 cm = volume: 41 mL. Increased echogenicity. No mass or hydronephrosis visualized. Bladder: Appears normal for degree of bladder distention. Other: Incidentally noted left retroperitoneal mass superior and medial to the left kidney, measuring 4.0 x 3.3 x 3.7 cm. Large ascites. IMPRESSION: 1. Left retroperitoneal mass, stable in size since 2021, and decreased in size since 2007. This was biopsied in 2007, pathology consistent with ganglioneuroma. 2. No renal mass. Left renal atrophy and bilateral renal increased echogenicity, consistent with medical renal disease. 3. Unchanged large ascites. Electronically Signed   By: Titus Dubin M.D.   On: 08/22/2022 12:30   CT ABDOMEN PELVIS WO CONTRAST  Result Date: 08/22/2022 CLINICAL DATA:  Abdominal distension EXAM: CT ABDOMEN  AND PELVIS WITHOUT CONTRAST TECHNIQUE: Multidetector CT imaging of the abdomen and pelvis was performed following the standard protocol without IV contrast. RADIATION DOSE REDUCTION: This exam was performed according to the departmental dose-optimization program which includes automated exposure control, adjustment of the mA and/or kV according to patient size and/or use of iterative reconstruction technique. COMPARISON:  06/04/2021 FINDINGS: Lower chest: Cardiomegaly. Small bilateral pleural effusions. Coronary artery and aortic calcifications. Hepatobiliary: No focal hepatic abnormality. Gallbladder unremarkable. Pancreas: No focal abnormality or ductal dilatation. Spleen: No focal abnormality.  Normal size. Adrenals/Urinary Tract: Left retroperitoneal mass again noted in the region surgical clips in the left retroperitoneum adjacent to the left kidney measuring 5.5 x 3.2 cm compared to up to 5.6 cm previously. Left kidney is atrophic. No renal stones or hydronephrosis. Renal vascular calcifications noted. Urinary bladder decompressed, grossly unremarkable. Stomach/Bowel: Scattered sigmoid diverticula. No active diverticulitis. Stomach and small bowel decompressed, grossly unremarkable. No bowel obstruction. Vascular/Lymphatic: Heavily calcified aorta, iliac vessels and branch vessels. No evidence of aneurysm or adenopathy. Reproductive: Uterus and adnexa unremarkable.  No mass. Other: Large volume ascites in the abdomen and pelvis. Musculoskeletal: No acute bony abnormality. IMPRESSION: Large volume ascites in the abdomen or pelvis. Small bilateral pleural effusions. Cardiomegaly, coronary artery disease. Left retroperitoneal mass adjacent to surgical clips superior to the left kidney. This is not significantly changed. Scattered sigmoid diverticulosis. Diffuse aortoiliac and branch vessel atherosclerosis. Electronically Signed   By: Rolm Baptise M.D.   On: 08/22/2022 01:03  DG Chest 2 View  Result Date:  08/21/2022 CLINICAL DATA:  Chest pain. EXAM: CHEST - 2 VIEW COMPARISON:  Abdominal series 07/04/2021 FINDINGS: The heart is moderately enlarged, unchanged. There is no focal lung consolidation, pleural effusion or pneumothorax. No acute fractures are seen. There are atherosclerotic calcifications of the aorta. IMPRESSION: 1. Stable moderate cardiomegaly. 2. No acute cardiopulmonary process. Electronically Signed   By: Ronney Asters M.D.   On: 08/21/2022 17:42     Assessment & Plan: Anita [name] is a 61 y.o.  female with past medical conditions including gout, GERD, COPD, multiple sclerosis, CAD, systolic heart failure with EF 40 to 45%, who was admitted to [facility] on 08/21/2022 for Abdominal distension [R14.0] Elevated troponin [R77.8] Hypervolemia, unspecified hypervolemia type [E87.70]  Acute kidney injury on chronic kidney disease stage IIIa likely due to cardiorenal syndrome.  Baseline creatinine appears to be 1.13 with GFR 56 on 06/14/2021.  Patient currently not followed by outpatient nephrology.  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  No recent IV contrast exposure.  Patient states no diuretic use prior to  admission.  Creatinine slightly improved since admission, 2.26.  Adequate urine output, 1 L, noted.  Will monitor bolus IV diuresis with low threshold to transition to Lasix drip if renal function deteriorates.  2.  Acute on chronic systolic heart failure.  Echo completed this admission shows EF 30 to 35% with mild LVH, severe mitral valve regurgitation and aortic valve calcification.  Cardiology following  3.  Acute metabolic acidosis.  Serum bicarb on admission 17. Slowly correcting  LOS: 1   9/16/20231:00 PM

## 2022-08-23 NOTE — Assessment & Plan Note (Addendum)
Secondary to hepatic congestion from heart failure.  Continues to improve as she diureses. Can follow outpatient.

## 2022-08-23 NOTE — Progress Notes (Signed)
[facility] Progress Note  Patient: Anita [name]    IHK:[id number]  DOA: 08/21/2022    Date of Service: the patient was seen and examined on 08/23/2022  Brief hospital course: 61 year old female with past medical history of multiple sclerosis and systolic/diastolic heart failure with moderate to severe tricuspid regurg and severe mitral regurg presented to the emergency room on 9/14 with complaints of increased facial and abdominal swelling for the past few weeks.  Patient is on diuretics, but has not been taking them as of late.  In the emergency room, patient found to have acute kidney injury with a creatinine of 2.73, BNP of greater than 4500, troponin of 600 and she was given a dose of Lasix in the emergency room.  Cardiology and nephrology consulted.  Assessment and Plan: Assessment and Plan: Acute on chronic combined systolic and diastolic CHF (congestive heart failure) [facility]) Cardiology following.  Echocardiogram done 9/15 notes ejection fraction of 30 to 35% with severe tricuspid and mitral valve regurg.  Global hypokinesis.  Indeterminate diastolic function.  This looks to be worse than previous echocardiogram done 15 months ago.  Has diuresed over a liter of fluid to date.  Nephrology recommends transitioning to Lasix drip if patient does not continue to have good urine output.  Anasarca Secondary to CHF.  Right ventricular failure causing transaminitis and overall hepatic congestion.  As above.  Acute kidney injury superimposed on stage IIIa chronic kidney disease ([place]) Secondary to decompensated heart failure.  Baseline creatinine around 1.13.  Nephrology following.  No indication for dialysis.  Creatinine slowly continues to improve as she diureses.  COPD (chronic obstructive pulmonary disease) (HCC) Stable, denies any shortness of breath  Essential hypertension Elevated diastolic blood pressures in part due to right ventricular failure.  Numbers improved with  diuresis.  Transaminitis Secondary to hepatic congestion from heart failure.Improving as she diureses  Elevated troponin Demand ischemia versus elevations due to renal failure and heart failure.  Cardiology following.  Continue to cycle.  Patient denies any chest pain at this time.  Multiple sclerosis ([place]) Stable at this time.  Not on any medications.  Coronary artery disease involving native coronary artery of native heart without angina pectoris Given decrease in ejection fraction, cardiology discussed with patient about whether or not she wants ischemic work-up.  Tobacco abuse Have provided counseling. Nicotine patch.        Body mass index is 24.99 kg/m.        Consultants: Cardiology Nephrology  Procedures: Echocardiogram noting worsening ejection fraction of 30 to 35%, severe mitral and tricuspid regurg, severe pulmonary hypertension and indeterminate diastolic function  Antimicrobials: None  Code Status: Full code   Subjective: Denies shortness of breath.  Denies medical noncompliance.  Objective: Vital signs were reviewed and unremarkable. Vitals:   08/23/22 1539 08/23/22 1539  BP: 115/82 115/82  Pulse: 65 65  Resp: 16 16  Temp: 97.9 F (36.6 C) 97.9 F (36.6 C)  SpO2: 100% 100%    Intake/Output Summary (Last 24 hours) at 08/23/2022 1611 Last data filed at 08/23/2022 0400 Gross per 24 hour  Intake --  Output 650 ml  Net -650 ml    Filed Weights   08/21/22 1658 08/22/22 0236 08/23/22 0415  Weight: 61.2 kg 63.2 kg 64 kg   Body mass index is 24.99 kg/m.  Exam:  General: Alert and oriented x3, no acute distress HEENT: Normocephalic, atraumatic, face appears swollen Cardiovascular: Regular rate and rhythm, S1-S2, 2 out of 6 systolic ejection murmur  Respiratory: Decreased breath sounds throughout Abdomen: Soft, nontender, distended, hypoactive bowel sounds Musculoskeletal: No clubbing or cyanosis, trace pitting edema Skin: No skin breaks,  tears or lesions Psychiatry: Appropriate, no evidence of psychoses Neurology: No focal deficits  Data Reviewed: Renal function continues to improve  Disposition:  Status is: Inpatient Remains inpatient appropriate because:  -Further diuresis -Decision on ischemic work-up    Anticipated discharge date: 9/18   Family Communication: Left message for daughter DVT Prophylaxis: enoxaparin (LOVENOX) injection 30 mg Start: 08/22/22 2200  Lovenox    Author: Annita Brod ,MD 08/23/2022 4:11 PM  To reach On-call, see care teams to locate the attending and reach out via [email or web address]. Between 7PM-7AM, please contact night-coverage If you still have difficulty reaching the attending provider, please page the [facility] (Director on Call) for [facility] on amion for assistance.

## 2022-08-23 NOTE — Plan of Care (Signed)

## 2022-08-24 DIAGNOSIS — N179 Acute kidney failure, unspecified: Secondary | ICD-10-CM | POA: Diagnosis not present

## 2022-08-24 DIAGNOSIS — I5043 Acute on chronic combined systolic (congestive) and diastolic (congestive) heart failure: Secondary | ICD-10-CM | POA: Diagnosis not present

## 2022-08-24 DIAGNOSIS — R778 Other specified abnormalities of plasma proteins: Secondary | ICD-10-CM | POA: Diagnosis not present

## 2022-08-24 DIAGNOSIS — R601 Generalized edema: Secondary | ICD-10-CM | POA: Diagnosis not present

## 2022-08-24 DIAGNOSIS — R7401 Elevation of levels of liver transaminase levels: Secondary | ICD-10-CM | POA: Diagnosis not present

## 2022-08-24 LAB — COMPREHENSIVE METABOLIC PANEL
ALT: 401 U/L — ABNORMAL HIGH (ref 0–44)
AST: 214 U/L — ABNORMAL HIGH (ref 15–41)
Albumin: 3.6 g/dL (ref 3.5–5.0)
Alkaline Phosphatase: 117 U/L (ref 38–126)
Anion gap: 11 (ref 5–15)
BUN: 65 mg/dL — ABNORMAL HIGH (ref 6–20)
CO2: 22 mmol/L (ref 22–32)
Calcium: 8.7 mg/dL — ABNORMAL LOW (ref 8.9–10.3)
Chloride: 99 mmol/L (ref 98–111)
Creatinine, Ser: 2.25 mg/dL — ABNORMAL HIGH (ref 0.44–1.00)
GFR, Estimated: 25 mL/min — ABNORMAL LOW (ref >60)
Glucose, Bld: 94 mg/dL (ref 70–99)
Potassium: 4.2 mmol/L (ref 3.5–5.1)
Sodium: 132 mmol/L — ABNORMAL LOW (ref 135–145)
Total Bilirubin: 1.8 mg/dL — ABNORMAL HIGH (ref 0.3–1.2)
Total Protein: 7.5 g/dL (ref 6.5–8.1)

## 2022-08-24 MED ORDER — PANTOPRAZOLE SODIUM 40 MG PO TBEC
40.0000 mg | DELAYED_RELEASE_TABLET | Freq: Two times a day (BID) | ORAL | Status: DC
  Administered 2022-08-24 – 2022-08-29 (×11): 40 mg via ORAL
  Filled 2022-08-24 (×11): qty 1

## 2022-08-24 MED ORDER — FUROSEMIDE 10 MG/ML IJ SOLN
4.0000 mg/h | INTRAVENOUS | Status: DC
  Administered 2022-08-24 – 2022-08-26 (×2): 4 mg/h via INTRAVENOUS
  Filled 2022-08-24 (×2): qty 20

## 2022-08-24 NOTE — Progress Notes (Signed)
[facility]  ROUNDING NOTE   Subjective:   Patient seen resting quietly, breakfast at bedside Alert and oriented Remains on room air Denies pain or discomfort Continues to complain of abdominal discomfort, bloating No lower extremity edema  Objective:  Vital signs in last 24 hours:  Temp:  [97.9 F (36.6 C)-98.1 F (36.7 C)] 98 F (36.7 C) (09/17 0754) Pulse Rate:  [65-70] 65 (09/17 0754) Resp:  [16-18] 17 (09/17 0754) BP: (115-121)/(82-91) 121/83 (09/17 0754) SpO2:  [99 %-100 %] 100 % (09/17 0754) Weight:  [63.7 kg] 63.7 kg (09/17 0500)  Weight change: -0.3 kg Filed Weights   08/22/22 0236 08/23/22 0415 08/24/22 0500  Weight: 63.2 kg 64 kg 63.7 kg    Intake/Output: I/O last 3 completed shifts: In: -  Out: 450 [Urine:450]   Intake/Output this shift:  No intake/output data recorded.  Physical Exam: General: NAD, resting comfortably  Head: Normocephalic, atraumatic. Moist oral mucosal membranes  Eyes: Anicteric  Lungs:  Diminished in bases, normal effort  Heart: Regular rate and rhythm  Abdomen:  Soft, nontender, moderate distention  Extremities: No peripheral edema.  Neurologic: Nonfocal, moving all four extremities  Skin: No lesions  Access: None    Basic Metabolic Panel: Recent Labs  Lab 08/21/22 1705 08/22/22 0412 08/23/22 0534 08/24/22 0508  NA 139 136 134* 132*  K 3.4* 3.7 4.2 4.2  CL 107 104 102 99  CO2 17* 21* 20* 22  GLUCOSE 94 107* 95 94  BUN 65* 66* 63* 65*  CREATININE 2.73* 2.60* 2.26* 2.25*  CALCIUM 8.0* 8.5* 8.8* 8.7*    Liver Function Tests: Recent Labs  Lab 08/22/22 0412 08/23/22 0534 08/24/22 0508  AST 464* 326* 214*  ALT 559* 482* 401*  ALKPHOS 118 116 117  BILITOT 1.5* 1.7* 1.8*  PROT 7.1 7.3 7.5  ALBUMIN 3.5 3.5 3.6   No results for input(s): "LIPASE", "AMYLASE" in the last 168 hours. No results for input(s): "AMMONIA" in the last 168 hours.  CBC: Recent Labs  Lab 08/21/22 1705 08/22/22 0412  WBC  9.7 9.1  HGB 10.6* 10.6*  HCT 36.0 34.5*  MCV 90.7 89.6  PLT 227 192    Cardiac Enzymes: No results for input(s): "CKTOTAL", "CKMB", "CKMBINDEX", "TROPONINI" in the last 168 hours.  BNP: Invalid input(s): "POCBNP"  CBG: No results for input(s): "GLUCAP" in the last 168 hours.  Microbiology: Results for orders placed or performed during the hospital encounter of 06/04/21  Urine Culture     Status: Abnormal   Collection Time: 06/04/21  2:56 PM   Specimen: Urine, Random  Result Value Ref Range Status   Specimen Description   Final    URINE, RANDOM Performed at [facility], [address] [place], [place] [address]    Special Requests   Final    NONE Performed at [facility], [address] [place], [place] [address]    Culture MULTIPLE SPECIES PRESENT, SUGGEST RECOLLECTION (A)  Final   Report Status 06/06/2021 FINAL  Final  Resp Panel by RT-PCR (Flu A&B, Covid) Nasopharyngeal Swab     Status: None   Collection Time: 06/04/21  8:23 PM   Specimen: Nasopharyngeal Swab; Nasopharyngeal(NP) swabs in vial transport medium  Result Value Ref Range Status   SARS Coronavirus 2 by RT PCR NEGATIVE NEGATIVE Final    Comment: (NOTE) SARS-CoV-2 target nucleic acids are NOT DETECTED.  The SARS-CoV-2 RNA is generally detectable in upper respiratory specimens during the acute phase of infection. The lowest  concentration of SARS-CoV-2 viral copies this assay can detect is 138 copies/mL. A negative result does not preclude SARS-Cov-2 infection and should not be used as the sole basis for treatment or other patient management decisions. A negative result may occur with  improper specimen collection/handling, submission of specimen other than nasopharyngeal swab, presence of viral mutation(s) within the areas targeted by this assay, and inadequate number of viral copies(<138 copies/mL). A negative result must be combined with clinical observations, patient history, and  epidemiological information. The expected result is Negative.  Fact Sheet for Patients:  EntrepreneurPulse.com.au  Fact Sheet for Healthcare Providers:  IncredibleEmployment.be  This test is no t yet approved or cleared by the [place] FDA and  has been authorized for detection and/or diagnosis of SARS-CoV-2 by FDA under an Emergency Use Authorization (EUA). This EUA will remain  in effect (meaning this test can be used) for the duration of the COVID-19 declaration under Section 564(b)(1) of the Act, 21 U.S.C.section 360bbb-3(b)(1), unless the authorization is terminated  or revoked sooner.       Influenza A by PCR NEGATIVE NEGATIVE Final   Influenza B by PCR NEGATIVE NEGATIVE Final    Comment: (NOTE) The Xpert Xpress SARS-CoV-2/FLU/RSV plus assay is intended as an aid in the diagnosis of influenza from Nasopharyngeal swab specimens and should not be used as a sole basis for treatment. Nasal washings and aspirates are unacceptable for Xpert Xpress SARS-CoV-2/FLU/RSV testing.  Fact Sheet for Patients: EntrepreneurPulse.com.au  Fact Sheet for Healthcare Providers: IncredibleEmployment.be  This test is not yet approved or cleared by the [place] FDA and has been authorized for detection and/or diagnosis of SARS-CoV-2 by FDA under an Emergency Use Authorization (EUA). This EUA will remain in effect (meaning this test can be used) for the duration of the COVID-19 declaration under Section 564(b)(1) of the Act, 21 U.S.C. section 360bbb-3(b)(1), unless the authorization is terminated or revoked.  Performed at [facility], [address] [place], [place] [address]     Coagulation Studies: No results for input(s): "LABPROT", "INR" in the last 72 hours.  Urinalysis: No results for input(s): "COLORURINE", "LABSPEC", "PHURINE", "GLUCOSEU", "HGBUR", "BILIRUBINUR", "KETONESUR",  "PROTEINUR", "UROBILINOGEN", "NITRITE", "LEUKOCYTESUR" in the last 72 hours.  Invalid input(s): "APPERANCEUR"    Imaging: ECHOCARDIOGRAM COMPLETE  Result Date: 08/22/2022    ECHOCARDIOGRAM REPORT   Patient Name:   Anita [name] Date of Exam: 08/22/2022 Medical Rec #:  [id number]             Height:       63.0 in Accession #:    [id number]            Weight:       139.3 lb Date of Birth:  02-Jul-1961             BSA:          1.658 m Patient Age:    18 years              BP:           134/89 mmHg Patient Gender: F                     HR:           85 bpm. Exam Location:  [facility] Procedure: 2D Echo, Color Doppler and Cardiac Doppler Indications:     Dyspnea R06.00  History:         Patient has prior history of Echocardiogram examinations, most  recent 02/07/2021. CHF; COPD. Severe Mitral regurgitation.  Sonographer:     Sherrie Sport Referring Phys:  Tarri Glenn Diagnosing Phys: Ida Rogue MD  Sonographer Comments: Technically challenging study due to limited acoustic windows, no subcostal window and suboptimal apical window. IMPRESSIONS  1. Left ventricular ejection fraction, by estimation, is 30 to 35%. The left ventricle has moderately decreased function. The left ventricle demonstrates global hypokinesis. There is mild left ventricular hypertrophy. Left ventricular diastolic parameters are indeterminate.  2. Right ventricular systolic function is severely reduced. The right ventricular size is moderately enlarged. Tricuspid regurgitation signal is inadequate for assessing PA pressure.  3. Right atrial size was moderately dilated.  4. The mitral valve is normal in structure. Severe mitral valve regurgitation. No evidence of mitral stenosis.  5. Tricuspid valve regurgitation is severe.  6. The aortic valve is normal in structure. Aortic valve regurgitation is mild. Aortic valve sclerosis/calcification is present, without any evidence of aortic stenosis.  7. The inferior vena cava is normal  in size with greater than 50% respiratory variability, suggesting right atrial pressure of 3 mmHg. FINDINGS  Left Ventricle: Left ventricular ejection fraction, by estimation, is 30 to 35%. The left ventricle has moderately decreased function. The left ventricle demonstrates global hypokinesis. The left ventricular internal cavity size was normal in size. There is mild left ventricular hypertrophy. Left ventricular diastolic parameters are indeterminate. Right Ventricle: The right ventricular size is moderately enlarged. No increase in right ventricular wall thickness. Right ventricular systolic function is severely reduced. Tricuspid regurgitation signal is inadequate for assessing PA pressure. Left Atrium: Left atrial size was normal in size. Right Atrium: Right atrial size was moderately dilated. Pericardium: There is no evidence of pericardial effusion. Mitral Valve: The mitral valve is normal in structure. Severe mitral valve regurgitation. No evidence of mitral valve stenosis. Tricuspid Valve: The tricuspid valve is normal in structure. Tricuspid valve regurgitation is severe. No evidence of tricuspid stenosis. Aortic Valve: The aortic valve is normal in structure. Aortic valve regurgitation is mild. Aortic valve sclerosis/calcification is present, without any evidence of aortic stenosis. Aortic valve mean gradient measures 2.5 mmHg. Aortic valve peak gradient measures 4.5 mmHg. Aortic valve area, by VTI measures 1.60 cm. Pulmonic Valve: The pulmonic valve was normal in structure. Pulmonic valve regurgitation is not visualized. No evidence of pulmonic stenosis. Aorta: The aortic root is normal in size and structure. Venous: The inferior vena cava is normal in size with greater than 50% respiratory variability, suggesting right atrial pressure of 3 mmHg. IAS/Shunts: No atrial level shunt detected by color flow Doppler. Additional Comments: There is a small pleural effusion in the left lateral region.  LEFT  VENTRICLE PLAX 2D LVIDd:         4.30 cm      Diastology LVIDs:         3.90 cm      LV e' medial:    6.42 cm/s LV PW:         1.20 cm      LV E/e' medial:  20.6 LV IVS:        1.30 cm      LV e' lateral:   10.10 cm/s LVOT diam:     2.00 cm      LV E/e' lateral: 13.1 LV SV:         28 LV SV Index:   17 LVOT Area:     3.14 cm  LV Volumes (MOD) LV vol d, MOD A2C: 144.0 ml LV  vol d, MOD A4C: 173.0 ml LV vol s, MOD A2C: 104.0 ml LV vol s, MOD A4C: 113.0 ml LV SV MOD A2C:     40.0 ml LV SV MOD A4C:     173.0 ml LV SV MOD BP:      58.0 ml RIGHT VENTRICLE RV Basal diam:  3.80 cm RV Mid diam:    3.60 cm RV S prime:     5.87 cm/s TAPSE (M-mode): 2.1 cm LEFT ATRIUM             Index        RIGHT ATRIUM           Index LA diam:        3.90 cm 2.35 cm/m   RA Area:     31.50 cm LA Vol (A2C):   95.5 ml 57.59 ml/m  RA Volume:   116.00 ml 69.95 ml/m LA Vol (A4C):   56.3 ml 33.95 ml/m LA Biplane Vol: 73.2 ml 44.14 ml/m  AORTIC VALVE AV Area (Vmax):    1.55 cm AV Area (Vmean):   1.37 cm AV Area (VTI):     1.60 cm AV Vmax:           105.50 cm/s AV Vmean:          72.200 cm/s AV VTI:            0.174 m AV Peak Grad:      4.5 mmHg AV Mean Grad:      2.5 mmHg LVOT Vmax:         51.90 cm/s LVOT Vmean:        31.400 cm/s LVOT VTI:          0.089 m LVOT/AV VTI ratio: 0.51  AORTA Ao Root diam: 3.07 cm MITRAL VALVE                TRICUSPID VALVE MV Area (PHT): 3.34 cm     TR Peak grad:   10.5 mmHg MV Decel Time: 227 msec     TR Vmax:        162.00 cm/s MV E velocity: 132.00 cm/s MV A velocity: 83.80 cm/s   SHUNTS MV E/A ratio:  1.58         Systemic VTI:  0.09 m                             Systemic Diam: 2.00 cm Ida Rogue MD Electronically signed by Ida Rogue MD Signature Date/Time: 08/22/2022/3:35:47 PM    Final      Medications:    sodium chloride     furosemide (LASIX) 200 mg in dextrose 5 % 100 mL (2 mg/mL) infusion 4 mg/hr (08/24/22 1305)    enoxaparin (LOVENOX) injection  30 mg Subcutaneous Q24H   feeding  supplement  237 mL Oral BID BM   ferrous sulfate  325 mg Oral BID WC   fluticasone furoate-vilanterol  1 puff Inhalation Daily   metoprolol succinate  25 mg Oral Daily   pantoprazole  40 mg Oral BID   sodium chloride flush  3 mL Intravenous Q12H   sodium chloride flush  3 mL Intravenous Q12H   sodium chloride, acetaminophen **OR** acetaminophen, albuterol, ondansetron (ZOFRAN) IV, sodium chloride flush, traMADol  Assessment/ Plan:  [name] is a 61 y.o.  female with past medical conditions including gout, GERD, COPD, multiple sclerosis, CAD, systolic heart failure with EF 40 to  45%, who was admitted to [facility] on 08/21/2022 for Abdominal distension [R14.0] Elevated troponin [R77.8] Hypervolemia, unspecified hypervolemia type [E87.70]   Acute kidney injury on chronic kidney disease stage IIIa likely due to cardiorenal syndrome.  Baseline creatinine appears to be 1.13 with GFR 56 on 06/14/2021.  Patient currently not followed by outpatient nephrology.  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  No recent IV contrast exposure.   Renal function appears stable today.  We maintained a low threshold to transition to IV furosemide and feel it is necessary to efficiently manage her fluid status.  Primary team has ordered IV furosemide per hour.  We will monitor renal with ordered diuretic.  Lab Results  Component Value Date   CREATININE 2.25 (H) 08/24/2022   CREATININE 2.26 (H) 08/23/2022   CREATININE 2.60 (H) 08/22/2022   No intake or output data in the 24 hours ending 08/24/22 1312  2.  Acute on chronic systolic heart failure.  Echo completed this admission shows EF 30 to 35% with mild LVH, severe mitral valve regurgitation and aortic valve calcification.  Cardiology following   3.  Acute metabolic acidosis.  Serum bicarb on admission 17. Resolved.   LOS: 2    9/17/20231:12 PM

## 2022-08-24 NOTE — Progress Notes (Signed)
Rounding Note    Patient Name: Anita [name] Date of Encounter: 08/24/2022  [place] Cardiologist: Buford Dresser, MD   Subjective   Shortness of breath improved.  Denies any chest pain.  Saturations 100% on 2 L O2. Inpatient Medications    Scheduled Meds:  enoxaparin (LOVENOX) injection  30 mg Subcutaneous Q24H   feeding supplement  237 mL Oral BID BM   ferrous sulfate  325 mg Oral BID WC   fluticasone furoate-vilanterol  1 puff Inhalation Daily   furosemide  40 mg Intravenous BID   metoprolol succinate  25 mg Oral Daily   pantoprazole (PROTONIX) IV  40 mg Intravenous Q12H   sodium chloride flush  3 mL Intravenous Q12H   sodium chloride flush  3 mL Intravenous Q12H   Continuous Infusions:  sodium chloride     PRN Meds: sodium chloride, acetaminophen **OR** acetaminophen, albuterol, ondansetron (ZOFRAN) IV, sodium chloride flush, traMADol   Vital Signs    Vitals:   08/23/22 1945 08/23/22 2351 08/24/22 0500 08/24/22 0754  BP: (!) 117/91 (!) 115/90  121/83  Pulse: 66 70  65  Resp: '18 18  17  '$ Temp: 97.9 F (36.6 C) 98.1 F (36.7 C)  98 F (36.7 C)  TempSrc:  Oral  Oral  SpO2: 99% 100%  100%  Weight:   63.7 kg   Height:       No intake or output data in the 24 hours ending 08/24/22 1024     08/24/2022    5:00 AM 08/23/2022    4:15 AM 08/22/2022    2:36 AM  Last 3 Weights  Weight (lbs) 140 lb 6.9 oz 141 lb 1.5 oz 139 lb 5.3 oz  Weight (kg) 63.7 kg 64 kg 63.2 kg      Telemetry    Normal sinus rhythm- Personally Reviewed  ECG    No new EKG to review- Personally Reviewed  Physical Exam   GEN: Well nourished, well developed in no acute distress HEENT: Normal NECK: No JVD; No carotid bruits LYMPHATICS: No lymphadenopathy CARDIAC:RRR, no murmurs, rubs, gallops RESPIRATORY: Deep breath sounds ABDOMEN: Abdomen significantly distended MUSCULOSKELETAL:  No edema; No deformity  SKIN: Warm and dry NEUROLOGIC:  Alert and oriented  x 3 PSYCHIATRIC:  Normal affect   Labs    High Sensitivity Troponin:   Recent Labs  Lab 08/21/22 1705 08/21/22 1840 08/23/22 0534  TROPONINIHS 601* 521* 122*      Chemistry Recent Labs  Lab 08/22/22 0412 08/23/22 0534 08/24/22 0508  NA 136 134* 132*  K 3.7 4.2 4.2  CL 104 102 99  CO2 21* 20* 22  GLUCOSE 107* 95 94  BUN 66* 63* 65*  CREATININE 2.60* 2.26* 2.25*  CALCIUM 8.5* 8.8* 8.7*  PROT 7.1 7.3 7.5  ALBUMIN 3.5 3.5 3.6  AST 464* 326* 214*  ALT 559* 482* 401*  ALKPHOS 118 116 117  BILITOT 1.5* 1.7* 1.8*  GFRNONAA 21* 24* 25*  ANIONGAP '11 12 11     '$ Lipids No results for input(s): "CHOL", "TRIG", "HDL", "LABVLDL", "LDLCALC", "CHOLHDL" in the last 168 hours.  Hematology Recent Labs  Lab 08/21/22 1705 08/22/22 0412  WBC 9.7 9.1  RBC 3.97 3.85*  HGB 10.6* 10.6*  HCT 36.0 34.5*  MCV 90.7 89.6  MCH 26.7 27.5  MCHC 29.4* 30.7  RDW 17.8* 17.9*  PLT 227 192    Thyroid No results for input(s): "TSH", "FREET4" in the last 168 hours.  BNP Recent Labs  Lab  08/21/22 1705  BNP >4,500.0*     DDimer No results for input(s): "DDIMER" in the last 168 hours.   Radiology    ECHOCARDIOGRAM COMPLETE  Result Date: 08/22/2022    ECHOCARDIOGRAM REPORT   Patient Name:   Anita [name] Date of Exam: 08/22/2022 Medical Rec #:  [id number]             Height:       63.0 in Accession #:    [id number]            Weight:       139.3 lb Date of Birth:  24-Jul-1961             BSA:          1.658 m Patient Age:    61 years              BP:           134/89 mmHg Patient Gender: F                     HR:           85 bpm. Exam Location:  [facility] Procedure: 2D Echo, Color Doppler and Cardiac Doppler Indications:     Dyspnea R06.00  History:         Patient has prior history of Echocardiogram examinations, most                  recent 02/07/2021. CHF; COPD. Severe Mitral regurgitation.  Sonographer:     Sherrie Sport Referring Phys:  Tarri Glenn Diagnosing Phys: Ida Rogue MD   Sonographer Comments: Technically challenging study due to limited acoustic windows, no subcostal window and suboptimal apical window. IMPRESSIONS  1. Left ventricular ejection fraction, by estimation, is 30 to 35%. The left ventricle has moderately decreased function. The left ventricle demonstrates global hypokinesis. There is mild left ventricular hypertrophy. Left ventricular diastolic parameters are indeterminate.  2. Right ventricular systolic function is severely reduced. The right ventricular size is moderately enlarged. Tricuspid regurgitation signal is inadequate for assessing PA pressure.  3. Right atrial size was moderately dilated.  4. The mitral valve is normal in structure. Severe mitral valve regurgitation. No evidence of mitral stenosis.  5. Tricuspid valve regurgitation is severe.  6. The aortic valve is normal in structure. Aortic valve regurgitation is mild. Aortic valve sclerosis/calcification is present, without any evidence of aortic stenosis.  7. The inferior vena cava is normal in size with greater than 50% respiratory variability, suggesting right atrial pressure of 3 mmHg. FINDINGS  Left Ventricle: Left ventricular ejection fraction, by estimation, is 30 to 35%. The left ventricle has moderately decreased function. The left ventricle demonstrates global hypokinesis. The left ventricular internal cavity size was normal in size. There is mild left ventricular hypertrophy. Left ventricular diastolic parameters are indeterminate. Right Ventricle: The right ventricular size is moderately enlarged. No increase in right ventricular wall thickness. Right ventricular systolic function is severely reduced. Tricuspid regurgitation signal is inadequate for assessing PA pressure. Left Atrium: Left atrial size was normal in size. Right Atrium: Right atrial size was moderately dilated. Pericardium: There is no evidence of pericardial effusion. Mitral Valve: The mitral valve is normal in structure. Severe  mitral valve regurgitation. No evidence of mitral valve stenosis. Tricuspid Valve: The tricuspid valve is normal in structure. Tricuspid valve regurgitation is severe. No evidence of tricuspid stenosis. Aortic Valve: The aortic valve is normal in structure. Aortic valve regurgitation is  mild. Aortic valve sclerosis/calcification is present, without any evidence of aortic stenosis. Aortic valve mean gradient measures 2.5 mmHg. Aortic valve peak gradient measures 4.5 mmHg. Aortic valve area, by VTI measures 1.60 cm. Pulmonic Valve: The pulmonic valve was normal in structure. Pulmonic valve regurgitation is not visualized. No evidence of pulmonic stenosis. Aorta: The aortic root is normal in size and structure. Venous: The inferior vena cava is normal in size with greater than 50% respiratory variability, suggesting right atrial pressure of 3 mmHg. IAS/Shunts: No atrial level shunt detected by color flow Doppler. Additional Comments: There is a small pleural effusion in the left lateral region.  LEFT VENTRICLE PLAX 2D LVIDd:         4.30 cm      Diastology LVIDs:         3.90 cm      LV e' medial:    6.42 cm/s LV PW:         1.20 cm      LV E/e' medial:  20.6 LV IVS:        1.30 cm      LV e' lateral:   10.10 cm/s LVOT diam:     2.00 cm      LV E/e' lateral: 13.1 LV SV:         28 LV SV Index:   17 LVOT Area:     3.14 cm  LV Volumes (MOD) LV vol d, MOD A2C: 144.0 ml LV vol d, MOD A4C: 173.0 ml LV vol s, MOD A2C: 104.0 ml LV vol s, MOD A4C: 113.0 ml LV SV MOD A2C:     40.0 ml LV SV MOD A4C:     173.0 ml LV SV MOD BP:      58.0 ml RIGHT VENTRICLE RV Basal diam:  3.80 cm RV Mid diam:    3.60 cm RV S prime:     5.87 cm/s TAPSE (M-mode): 2.1 cm LEFT ATRIUM             Index        RIGHT ATRIUM           Index LA diam:        3.90 cm 2.35 cm/m   RA Area:     31.50 cm LA Vol (A2C):   95.5 ml 57.59 ml/m  RA Volume:   116.00 ml 69.95 ml/m LA Vol (A4C):   56.3 ml 33.95 ml/m LA Biplane Vol: 73.2 ml 44.14 ml/m  AORTIC  VALVE AV Area (Vmax):    1.55 cm AV Area (Vmean):   1.37 cm AV Area (VTI):     1.60 cm AV Vmax:           105.50 cm/s AV Vmean:          72.200 cm/s AV VTI:            0.174 m AV Peak Grad:      4.5 mmHg AV Mean Grad:      2.5 mmHg LVOT Vmax:         51.90 cm/s LVOT Vmean:        31.400 cm/s LVOT VTI:          0.089 m LVOT/AV VTI ratio: 0.51  AORTA Ao Root diam: 3.07 cm MITRAL VALVE                TRICUSPID VALVE MV Area (PHT): 3.34 cm     TR Peak grad:   10.5 mmHg MV Decel  Time: 227 msec     TR Vmax:        162.00 cm/s MV E velocity: 132.00 cm/s MV A velocity: 83.80 cm/s   SHUNTS MV E/A ratio:  1.58         Systemic VTI:  0.09 m                             Systemic Diam: 2.00 cm Ida Rogue MD Electronically signed by Ida Rogue MD Signature Date/Time: 08/22/2022/3:35:47 PM    Final    US RENAL  Result Date: 08/22/2022 CLINICAL DATA:  Left retroperitoneal mass seen on CT. EXAM: RENAL / URINARY TRACT ULTRASOUND COMPLETE COMPARISON:  CT abdomen pelvis from same day. CT chest dated March 20, 2020. PET-CT dated March 19, 2006. FINDINGS: Right Kidney: Renal measurements: 9.8 x 5.1 x 5.3 cm = volume: 139 mL. Increased echogenicity. No mass or hydronephrosis visualized. Left Kidney: Renal measurements: 6.5 x 3.7 x 3.3 cm = volume: 41 mL. Increased echogenicity. No mass or hydronephrosis visualized. Bladder: Appears normal for degree of bladder distention. Other: Incidentally noted left retroperitoneal mass superior and medial to the left kidney, measuring 4.0 x 3.3 x 3.7 cm. Large ascites. IMPRESSION: 1. Left retroperitoneal mass, stable in size since 2021, and decreased in size since 2007. This was biopsied in 2007, pathology consistent with ganglioneuroma. 2. No renal mass. Left renal atrophy and bilateral renal increased echogenicity, consistent with medical renal disease. 3. Unchanged large ascites. Electronically Signed   By: Titus Dubin M.D.   On: 08/22/2022 12:30    Cardiac Studies   Echo  3.3.22 1. Left ventricular ejection fraction, by estimation, is 40 to 45%. Left  ventricular ejection fraction by 3D volume is 47 %. The left ventricle has  mildly decreased function. The left ventricle demonstrates global  hypokinesis. Left ventricular diastolic   parameters are consistent with Grade II diastolic dysfunction  (pseudonormalization). Elevated left ventricular end-diastolic pressure.   2. Right ventricular systolic function is severely reduced. The right  ventricular size is mildly enlarged. There is severely elevated pulmonary  artery systolic pressure. The estimated right ventricular systolic  pressure is 16.1 mmHg.   3. Left atrial size was moderately dilated.   4. Right atrial size was severely dilated.   5. The mitral valve is degenerative with mild to moderate thickening of  the mitral valve leaflets. There is teathering of the MV leaflets due to  mild LV dysfunction. Severe mitral valve regurgitation. Mild to moderate  mitral stenosis. The mean mitral  valve gradient is 6.5 mmHg.   6. Tricuspid valve regurgitation is moderate to severe.   7. The aortic valve is tricuspid. Aortic valve regurgitation is mild.  Mild aortic valve sclerosis is present, with no evidence of aortic valve  stenosis. Aortic regurgitation PHT measures 499 msec.   8. The inferior vena cava is dilated in size with <50% respiratory  variability, suggesting right atrial pressure of 15 mmHg.   9. There is a trivial pericardial effusion posterior to the left  ventricle.  10. Compared to prior echo, Mitral regurgitation appears worse and RV  dysfunction is now present. There is severe Pulmonary HTN.   Echo 03/20/20  1. Left ventricular ejection fraction, by estimation, is 40 to 45%. The  left ventricle has mildly decreased function. The left ventricle  demonstrates global hypokinesis. The left ventricular internal cavity size  was mildly dilated. Left ventricular  diastolic function could not  be  evaluated.   2. Right ventricular systolic function is normal. The right ventricular  size is normal. There is moderately elevated pulmonary artery systolic  pressure.   3. Left atrial size was mildly dilated.   4. Moderate mitral subvalvular thickening/fibrosis.   5. The mitral valve is rheumatic. Moderate to severe mitral valve  regurgitation. Mild mitral stenosis. The mean mitral valve gradient is 7.3  mmHg with average heart rate of 105 bpm.   6. The aortic valve is normal in structure. Aortic valve regurgitation is  not visualized.   7. The inferior vena cava is dilated in size with <50% respiratory  variability, suggesting right atrial pressure of 15 mmHg.   Cath 03/23/20 Hemodynamics: LV end diastolic pressure is moderately elevated. ----Coronary Angiography----- Ost RCA to Dist RCA lesion is 100% stenosed.-The PDA and PL system fills via faint collaterals from the AV groove LCx and LAD septals. Mid LAD-1 lesion is 60% stenosed. Mid LAD-2 lesion is 50% stenosed. Dist LAD lesion is 55% stenosed with 70% stenosed side branch in 2nd Diag. 1st Diag lesion is 70% stenosed. Ost Cx to Prox Cx lesion is 45% stenosed. Prox-MID Cx lesion is 65% stenosed.   MODERATE-SEVERE THREE-VESSEL CAD: 100% proximal RCA occlusion with left-to-right collaterals faintly filling PDA and PL system (AV groove LCx-PL and LAD septal-PDA) Diffuse moderate mid LAD disease with 60% to 50% stenosis. Tandem 50% and 65% proximal and mid LCx with the 65% lesion being the most significant lesion. Moderately elevated LVEDP of 18-20 mmHg   Given the extent of disease in the LAD and LCx, neither lesion is very amenable to PCI, and RCA is chronically occluded.  Given her comorbidities, I do not think she would be a good CABG candidate especially in light of the fact that she would likely require valve surgery as well.  She would not recover well.   Recommendations aggressive medical management.    Patient Profile      61 y.o. female with a hx of multiple sclerosis, CAD, severe MR with moderate mitral stenosis, COPD prior tobacco use who is being seen 08/22/2022 for the evaluation of CHF   Assessment & Plan    Ascites - in the setting if CHF with RV failure, mod to severe MR, and pulmonary HTN - CTA abdomen/pelvis showed large volume ascitics, small bilateral pleural effusion, left retroperitoneal mass - AST 464/ALT 559> this is slowly improving with diuresis - repeat echo showed worse LVEF 30-35% with severely reduced RVSF, severe MR - continue IV lasix   ICM Acute on chronic systolic and diastolic heart failure - PTA torsemide '20mg'$  PRN, patient had not been taking this for the last few days - BNP>4500 - Echo from 02/2021 showed LVEF 40-45%, global HK, G2DD, severely reduced RV function, severe MR and mld to mod MS, mod to severe TR, mild AI, severe pulmonary HTN - Echo this admission showed LVEF 30-35%, global HK, mid LVH, severely reduced RVSF, severe MR -She was given IV lasix '60mg'$  x1 in the ER and Lasix increased to 40 mg IV twice daily yesterday -I's and O's are incomplete for the past 24 hours -Weight is down 1 pound from yesterday and 5 pounds from admission -Renal function continues to slowly improve and is down to 2.25 from 2.73 on 9/14   Elevated troponin CAD with CTO RCA - No chest pain reported - HS troponin elevated to 600 with flat trend - suspect demand ischemia - Echo with worsening LV function decreased from 40  to 45% now down to 30 to 35% - Continue Toprol-XL 25 mg daily - Not a good cath candidate given AKI/CKD.  - Can consider Myoview Lexiscan   Severe MR and moderate MS - not felt to be surgical candidate for Mitraclip, also patient has denied work-up in the past - repeat echo showed LVEF 30-35%, severe MR   AKI on CKD - Scr 2.73, BUN 65 on arrival - Scr 1.13 a year ago -Slowly improving with diuresis   GOC - she is followed by Palliative Care  I have spent a total  of 30 minutes with patient reviewing 2D echo , telemetry, EKGs, labs and examining patient as well as establishing an assessment and plan that was discussed with the patient.  > 50% of time was spent in direct patient care.     For questions or updates, please contact [place] Please consult [email or web address] for contact info under        Signed, Fransico Him, MD  08/24/2022, 10:24 AM

## 2022-08-24 NOTE — Plan of Care (Signed)

## 2022-08-24 NOTE — Progress Notes (Signed)
PHARMACIST - PHYSICIAN COMMUNICATION  CONCERNING: IV to Oral Route Change Policy  RECOMMENDATION: This patient is receiving pantoprazole by the intravenous route.  Based on criteria approved by the Pharmacy and Therapeutics Committee, the intravenous medication(s) is/are being converted to the equivalent oral dose form(s).   DESCRIPTION: These criteria include: The patient is eating (either orally or via tube) and/or has been taking other orally administered medications for a least 24 hours The patient has no evidence of active gastrointestinal bleeding or impaired GI absorption (gastrectomy, short bowel, patient on TNA or NPO).  If you have questions about this conversion, please contact the [place], [facility] 08/24/2022 10:50 AM

## 2022-08-24 NOTE — Progress Notes (Signed)
[facility] Progress Note  Patient: Anita [name]    YQM:[id number]  DOA: 08/21/2022    Date of Service: the patient was seen and examined on 08/24/2022  Brief hospital course: 61 year old female with past medical history of multiple sclerosis and systolic/diastolic heart failure with moderate to severe tricuspid regurg and severe mitral regurg presented to the emergency room on 9/14 with complaints of increased facial and abdominal swelling for the past few weeks.  Patient is on diuretics, but has not been taking them as of late.  In the emergency room, patient found to have acute kidney injury with a creatinine of 2.73, BNP of greater than 4500, troponin of 600 and she was given a dose of Lasix in the emergency room.  Cardiology and nephrology consulted.  Assessment and Plan: Assessment and Plan: Acute on chronic combined systolic and diastolic CHF (congestive heart failure) [facility]) Cardiology following.  Echocardiogram done 9/15 notes ejection fraction of 30 to 35% with severe tricuspid and mitral valve regurg.  Global hypokinesis.  Indeterminate diastolic function.  This looks to be worse than previous echocardiogram done 15 months ago.  Has diuresed about a liter of fluid so far.  Minimum change from previous day, so changed to Lasix drip on 9/17 as per nephrology recommendations  Anasarca Secondary to CHF.  Right ventricular failure causing transaminitis and overall hepatic congestion.  As above.  Acute kidney injury superimposed on stage IIIa chronic kidney disease ([place]) Secondary to decompensated heart failure.  Baseline creatinine around 1.13.  Nephrology following.  No indication for dialysis.  Creatinine had been improving, no change from previous day  COPD (chronic obstructive pulmonary disease) (HCC) Stable, denies any shortness of breath  Essential hypertension Elevated diastolic blood pressures in part due to right ventricular failure.  Numbers improved with  diuresis.  Transaminitis Secondary to hepatic congestion from heart failure.Improving as she diureses  Elevated troponin Demand ischemia versus elevations due to renal failure and heart failure.  Cardiology following.  Continue to cycle.  Patient denies any chest pain at this time.  Multiple sclerosis ([place]) Stable at this time.  Not on any medications.  Coronary artery disease involving native coronary artery of native heart without angina pectoris Given decrease in ejection fraction, cardiology discussed with patient about whether or not she wants ischemic work-up.  Tobacco abuse Have provided counseling. Nicotine patch.        Body mass index is 24.88 kg/m.        Consultants: Cardiology Nephrology  Procedures: Echocardiogram noting worsening ejection fraction of 30 to 35%, severe mitral and tricuspid regurg, severe pulmonary hypertension and indeterminate diastolic function  Antimicrobials: None  Code Status: Full code   Subjective: Patient doing okay, feels about the same  Objective: Vital signs were reviewed and unremarkable. Vitals:   08/23/22 2351 08/24/22 0754  BP: (!) 115/90 121/83  Pulse: 70 65  Resp: 18 17  Temp: 98.1 F (36.7 C) 98 F (36.7 C)  SpO2: 100% 100%   No intake or output data in the 24 hours ending 08/24/22 1327  Filed Weights   08/22/22 0236 08/23/22 0415 08/24/22 0500  Weight: 63.2 kg 64 kg 63.7 kg   Body mass index is 24.88 kg/m.  Exam:  General: Alert and oriented x3, no acute distress HEENT: Normocephalic, atraumatic, face appears swollen Cardiovascular: Regular rate and rhythm, S1-S2, 2 out of 6 systolic ejection murmur Respiratory: Decreased breath sounds throughout Abdomen: Soft, nontender, distended, hypoactive bowel sounds Musculoskeletal: No clubbing or cyanosis, trace pitting  edema Skin: No skin breaks, tears or lesions Psychiatry: Appropriate, no evidence of psychoses Neurology: No focal deficits  Data  Reviewed: Transaminases improving, renal function unchanged from previous day  Disposition:  Status is: Inpatient Remains inpatient appropriate because:  -Further diuresis -Decision on ischemic work-up    Anticipated discharge date: 9/19   Family Communication: Left message for daughter DVT Prophylaxis: enoxaparin (LOVENOX) injection 30 mg Start: 08/22/22 2200  Lovenox    Author: Annita Brod ,MD 08/24/2022 1:27 PM  To reach On-call, see care teams to locate the attending and reach out via [email or web address]. Between 7PM-7AM, please contact night-coverage If you still have difficulty reaching the attending provider, please page the [facility] (Director on Call) for [facility] on amion for assistance.

## 2022-08-25 DIAGNOSIS — R778 Other specified abnormalities of plasma proteins: Secondary | ICD-10-CM | POA: Diagnosis not present

## 2022-08-25 DIAGNOSIS — I5043 Acute on chronic combined systolic (congestive) and diastolic (congestive) heart failure: Secondary | ICD-10-CM | POA: Diagnosis not present

## 2022-08-25 DIAGNOSIS — R601 Generalized edema: Secondary | ICD-10-CM | POA: Diagnosis not present

## 2022-08-25 DIAGNOSIS — N179 Acute kidney failure, unspecified: Secondary | ICD-10-CM | POA: Diagnosis not present

## 2022-08-25 LAB — COMPREHENSIVE METABOLIC PANEL
ALT: 253 U/L — ABNORMAL HIGH (ref 0–44)
AST: 120 U/L — ABNORMAL HIGH (ref 15–41)
Albumin: 3.4 g/dL — ABNORMAL LOW (ref 3.5–5.0)
Alkaline Phosphatase: 112 U/L (ref 38–126)
Anion gap: 11 (ref 5–15)
BUN: 60 mg/dL — ABNORMAL HIGH (ref 6–20)
CO2: 23 mmol/L (ref 22–32)
Calcium: 8.3 mg/dL — ABNORMAL LOW (ref 8.9–10.3)
Chloride: 96 mmol/L — ABNORMAL LOW (ref 98–111)
Creatinine, Ser: 2.24 mg/dL — ABNORMAL HIGH (ref 0.44–1.00)
GFR, Estimated: 25 mL/min — ABNORMAL LOW (ref >60)
Glucose, Bld: 100 mg/dL — ABNORMAL HIGH (ref 70–99)
Potassium: 4.5 mmol/L (ref 3.5–5.1)
Sodium: 130 mmol/L — ABNORMAL LOW (ref 135–145)
Total Bilirubin: 2.1 mg/dL — ABNORMAL HIGH (ref 0.3–1.2)
Total Protein: 7 g/dL (ref 6.5–8.1)

## 2022-08-25 NOTE — Plan of Care (Signed)

## 2022-08-25 NOTE — Progress Notes (Signed)
[facility] Progress Note  Patient: Anita [name]    BVQ:[id number]  DOA: 08/21/2022    Date of Service: the patient was seen and examined on 08/25/2022  Brief hospital course: 61 year old female with past medical history of multiple sclerosis and systolic/diastolic heart failure with moderate to severe tricuspid regurg and severe mitral regurg presented to the emergency room on 9/14 with complaints of increased facial and abdominal swelling for the past few weeks.  Patient is on diuretics, but has not been taking them as of late.  In the emergency room, patient found to have acute kidney injury with a creatinine of 2.73, BNP of greater than 4500, troponin of 600 and she was given a dose of Lasix in the emergency room.  Cardiology and nephrology consulted.  Following suboptimal response to doses of IV Lasix, patient changed over to Lasix drip.  Assessment and Plan: Assessment and Plan: Acute on chronic combined systolic and diastolic CHF (congestive heart failure) [facility]) Cardiology following.  Echocardiogram done 9/15 notes ejection fraction of 30 to 35% with severe tricuspid and mitral valve regurg.  Global hypokinesis.  Indeterminate diastolic function.  This looks to be worse than previous echocardiogram done 15 months ago.  Has diuresed about a liter of fluid so far.  Minimum change from previous day, so changed to Lasix drip on 9/17 as per nephrology recommendations.  Since then, patient has diuresed almost 2 L and is -1.5 L deficient.  Anasarca Secondary to CHF.  Right ventricular failure causing transaminitis and overall hepatic congestion.  As above.  Improving.  Acute kidney injury superimposed on stage IIIa chronic kidney disease ([place]) Secondary to decompensated heart failure.  Baseline creatinine around 1.13.  Nephrology following.  No indication for dialysis.  Creatinine had been improving, no change in the last 2 days, despite changing over to Lasix drip.  COPD (chronic  obstructive pulmonary disease) (HCC) Stable, denies any shortness of breath  Essential hypertension Elevated diastolic blood pressures in part due to right ventricular failure.  Numbers improved with diuresis.  Transaminitis Secondary to hepatic congestion from heart failure.  Continues to improve as she diureses  Elevated troponin Demand ischemia versus elevations due to renal failure and heart failure.  Cardiology following.  Continue to cycle.  Patient denies any chest pain at this time.  Multiple sclerosis ([place]) Stable at this time.  Not on any medications.  Coronary artery disease involving native coronary artery of native heart without angina pectoris Given decrease in ejection fraction, cardiology discussed with patient about whether or not she wants ischemic work-up.  Tobacco abuse Have provided counseling. Nicotine patch.        Body mass index is 24.88 kg/m.        Consultants: Cardiology Nephrology  Procedures: Echocardiogram noting worsening ejection fraction of 30 to 35%, severe mitral and tricuspid regurg, severe pulmonary hypertension and indeterminate diastolic function  Antimicrobials: None  Code Status: Full code   Subjective: Patient doing okay, feels about the same  Objective: Vital signs were reviewed and unremarkable. Vitals:   08/24/22 1636 08/25/22 0010  BP: 119/86 122/78  Pulse: 65 65  Resp:  16  Temp: 97.8 F (36.6 C) 97.8 F (36.6 C)  SpO2: 100% 100%    Intake/Output Summary (Last 24 hours) at 08/25/2022 1508 Last data filed at 08/25/2022 0611 Gross per 24 hour  Intake --  Output 750 ml  Net -750 ml    Filed Weights   08/23/22 0415 08/24/22 0500 08/25/22 0610  Weight: 64  kg 63.7 kg 63.7 kg   Body mass index is 24.88 kg/m.  Exam:  General: Alert and oriented x3, no acute distress HEENT: Normocephalic, atraumatic, face appears swollen Cardiovascular: Regular rate and rhythm, S1-S2, 2 out of 6 systolic ejection  murmur Respiratory: Decreased breath sounds throughout Abdomen: Soft, nontender, distended, hypoactive bowel sounds Musculoskeletal: No clubbing or cyanosis, trace pitting edema Skin: No skin breaks, tears or lesions Psychiatry: Appropriate, no evidence of psychoses Neurology: No focal deficits  Data Reviewed: Transaminases improving, renal function unchanged from previous day  Disposition:  Status is: Inpatient Remains inpatient appropriate because:  -Further diuresis -Decision on ischemic work-up    Anticipated discharge date: 9/20   Family Communication: Left message for daughter DVT Prophylaxis: enoxaparin (LOVENOX) injection 30 mg Start: 08/22/22 2200  Lovenox    Author: Annita Brod ,MD 08/25/2022 3:08 PM  To reach On-call, see care teams to locate the attending and reach out via [email or web address]. Between 7PM-7AM, please contact night-coverage If you still have difficulty reaching the attending provider, please page the [facility] (Director on Call) for [facility] on amion for assistance.

## 2022-08-25 NOTE — Progress Notes (Signed)
Rounding Note    Patient Name: Anita [name] Date of Encounter: 08/25/2022  [place] Cardiologist: Buford Dresser, MD   Subjective   Patient seen on AM rounds. Denies any chest pain and has improving shortness of breath. Maintaining on 2L O2 via [place].-750 mls output in the last 24 hours. Remains on lasix drip. Endorses abdominal distention and bloating.  Inpatient Medications    Scheduled Meds:  enoxaparin (LOVENOX) injection  30 mg Subcutaneous Q24H   feeding supplement  237 mL Oral BID BM   ferrous sulfate  325 mg Oral BID WC   fluticasone furoate-vilanterol  1 puff Inhalation Daily   metoprolol succinate  25 mg Oral Daily   pantoprazole  40 mg Oral BID   sodium chloride flush  3 mL Intravenous Q12H   sodium chloride flush  3 mL Intravenous Q12H   Continuous Infusions:  sodium chloride     furosemide (LASIX) 200 mg in dextrose 5 % 100 mL (2 mg/mL) infusion 4 mg/hr (08/24/22 1305)   PRN Meds: sodium chloride, acetaminophen **OR** acetaminophen, albuterol, ondansetron (ZOFRAN) IV, sodium chloride flush, traMADol   Vital Signs    Vitals:   08/24/22 0754 08/24/22 1636 08/25/22 0010 08/25/22 0610  BP: 121/83 119/86 122/78   Pulse: 65 65 65   Resp: 17  16   Temp: 98 F (36.7 C) 97.8 F (36.6 C) 97.8 F (36.6 C)   TempSrc: Oral     SpO2: 100% 100% 100%   Weight:    63.7 kg  Height:        Intake/Output Summary (Last 24 hours) at 08/25/2022 1101 Last data filed at 08/25/2022 0611 Gross per 24 hour  Intake --  Output 750 ml  Net -750 ml      08/25/2022    6:10 AM 08/24/2022    5:00 AM 08/23/2022    4:15 AM  Last 3 Weights  Weight (lbs) 140 lb 6.9 oz 140 lb 6.9 oz 141 lb 1.5 oz  Weight (kg) 63.7 kg 63.7 kg 64 kg      Telemetry    sinus - Personally Reviewed  ECG    No new tracings - Personally Reviewed  Physical Exam   GEN: No acute distress.   Neck: No JVD Cardiac: RRR, positive murmur, without rubs, or gallops.   Respiratory: Clear to upper lobes with diminished bases to auscultation bilaterally. Respirations are unlabored on 2L via  at rest. GI: Soft, nontender, distended, bowel sounds present MS: No edema; No deformity. Neuro:  Nonfocal  Psych: Normal affect   Labs    High Sensitivity Troponin:   Recent Labs  Lab 08/21/22 1705 08/21/22 1840 08/23/22 0534  TROPONINIHS 601* 521* 122*     Chemistry Recent Labs  Lab 08/23/22 0534 08/24/22 0508 08/25/22 0626  NA 134* 132* 130*  K 4.2 4.2 4.5  CL 102 99 96*  CO2 20* 22 23  GLUCOSE 95 94 100*  BUN 63* 65* 60*  CREATININE 2.26* 2.25* 2.24*  CALCIUM 8.8* 8.7* 8.3*  PROT 7.3 7.5 7.0  ALBUMIN 3.5 3.6 3.4*  AST 326* 214* 120*  ALT 482* 401* 253*  ALKPHOS 116 117 112  BILITOT 1.7* 1.8* 2.1*  GFRNONAA 24* 25* 25*  ANIONGAP '12 11 11    '$ Lipids No results for input(s): "CHOL", "TRIG", "HDL", "LABVLDL", "LDLCALC", "CHOLHDL" in the last 168 hours.  Hematology Recent Labs  Lab 08/21/22 1705 08/22/22 0412  WBC 9.7 9.1  RBC 3.97 3.85*  HGB 10.6*  10.6*  HCT 36.0 34.5*  MCV 90.7 89.6  MCH 26.7 27.5  MCHC 29.4* 30.7  RDW 17.8* 17.9*  PLT 227 192   Thyroid No results for input(s): "TSH", "FREET4" in the last 168 hours.  BNP Recent Labs  Lab 08/21/22 1705  BNP >4,500.0*    DDimer No results for input(s): "DDIMER" in the last 168 hours.   Radiology    No results found.  Cardiac Studies  TTE 08/22/22 1. Left ventricular ejection fraction, by estimation, is 30 to 35%. The  left ventricle has moderately decreased function. The left ventricle  demonstrates global hypokinesis. There is mild left ventricular  hypertrophy. Left ventricular diastolic  parameters are indeterminate.   2. Right ventricular systolic function is severely reduced. The right  ventricular size is moderately enlarged. Tricuspid regurgitation signal is  inadequate for assessing PA pressure.   3. Right atrial size was moderately dilated.   4. The mitral  valve is normal in structure. Severe mitral valve  regurgitation. No evidence of mitral stenosis.   5. Tricuspid valve regurgitation is severe.   6. The aortic valve is normal in structure. Aortic valve regurgitation is  mild. Aortic valve sclerosis/calcification is present, without any  evidence of aortic stenosis.   7. The inferior vena cava is normal in size with greater than 50%  respiratory variability, suggesting right atrial pressure of 3 mmHg.   Patient Profile     61 y.o. female with a history of multiple sclerosis, CAD, severe MR with moderate mitral stenosis, COPD prior tobacco use, who is being seen and elevated for CHF.  Assessment & Plan    Ascites -CTA abdomen/pelvis showed large volume ascites, small bilateral pleural effusion, left retroperitoneal mass -AST 464 and ALT 559 -lasix drip per Nephrology -breathing improved   Ischemic cardiomyopathy/ acute on chronic systolic and diastolic heart failure -LVEF 30-35% -BNP greater than 4500 -patient had not been taking torsemide at home -given lasix 60 mg IVP x 1 dose in the emergency department -placed on lasix drip -daily bmp -dieresis per Nephrology -continue toprol xl 25 mg daily -renal function slowly improving  -daily weight, I&O, low sodium diet  Elevated Hs troponin/ Coronary artery disease with CTO RCA -chest pain free -Hs troponins trended and peaked at 601 -echocardiogram revealed worsening LV function -can consider lexiscan myoview later if agreeable, but not a good candidate for LHC due to AKI/CKD  Severe MR and moderate MS -positive murmur on exam -previously had felt not to be a candidate for Mitraclip -patient has also refused further work-up in the past -repeat echocardiogram revealed LVEF 30-35% severe MR, severe TR, RHV function severely reduced  AKI on CKD -serum creatinine 2.24 -continued on lasix drip -dieresis per Nephrology -monitor urine output -daily bmp -monitor/trend/replete  electrolytes as needed -avoid nephrotoxic agents where able     For questions or updates, please contact [place] Please consult [email or web address] for contact info under        Signed, Tziporah Knoke, NP  08/25/2022, 11:01 AM

## 2022-08-25 NOTE — Progress Notes (Signed)
[facility]  ROUNDING NOTE   Subjective:   Patient resting quietly Alert and oriented Continues to have abdominal discomfort.    Objective:  Vital signs in last 24 hours:  Temp:  [97.8 F (36.6 C)] 97.8 F (36.6 C) (09/18 0010) Pulse Rate:  [65] 65 (09/18 0010) Resp:  [16] 16 (09/18 0010) BP: (119-122)/(78-86) 122/78 (09/18 0010) SpO2:  [100 %] 100 % (09/18 0010) Weight:  [63.7 kg] 63.7 kg (09/18 0610)  Weight change: 0 kg Filed Weights   08/23/22 0415 08/24/22 0500 08/25/22 0610  Weight: 64 kg 63.7 kg 63.7 kg    Intake/Output: I/O last 3 completed shifts: In: -  Out: 750 [Urine:750]   Intake/Output this shift:  No intake/output data recorded.  Physical Exam: General: NAD, resting comfortably  Head: Normocephalic, atraumatic. Moist oral mucosal membranes  Eyes: Anicteric  Lungs:  Diminished in bases, normal effort  Heart: Regular rate and rhythm  Abdomen:  Soft, tender, moderate distention  Extremities: No peripheral edema.  Neurologic: Nonfocal, moving all four extremities  Skin: No lesions  Access: None    Basic Metabolic Panel: Recent Labs  Lab 08/21/22 1705 08/22/22 0412 08/23/22 0534 08/24/22 0508 08/25/22 0626  NA 139 136 134* 132* 130*  K 3.4* 3.7 4.2 4.2 4.5  CL 107 104 102 99 96*  CO2 17* 21* 20* 22 23  GLUCOSE 94 107* 95 94 100*  BUN 65* 66* 63* 65* 60*  CREATININE 2.73* 2.60* 2.26* 2.25* 2.24*  CALCIUM 8.0* 8.5* 8.8* 8.7* 8.3*     Liver Function Tests: Recent Labs  Lab 08/22/22 0412 08/23/22 0534 08/24/22 0508 08/25/22 0626  AST 464* 326* 214* 120*  ALT 559* 482* 401* 253*  ALKPHOS 118 116 117 112  BILITOT 1.5* 1.7* 1.8* 2.1*  PROT 7.1 7.3 7.5 7.0  ALBUMIN 3.5 3.5 3.6 3.4*    No results for input(s): "LIPASE", "AMYLASE" in the last 168 hours. No results for input(s): "AMMONIA" in the last 168 hours.  CBC: Recent Labs  Lab 08/21/22 1705 08/22/22 0412  WBC 9.7 9.1  HGB 10.6* 10.6*  HCT 36.0 34.5*  MCV  90.7 89.6  PLT 227 192     Cardiac Enzymes: No results for input(s): "CKTOTAL", "CKMB", "CKMBINDEX", "TROPONINI" in the last 168 hours.  BNP: Invalid input(s): "POCBNP"  CBG: No results for input(s): "GLUCAP" in the last 168 hours.  Microbiology: Results for orders placed or performed during the hospital encounter of 06/04/21  Urine Culture     Status: Abnormal   Collection Time: 06/04/21  2:56 PM   Specimen: Urine, Random  Result Value Ref Range Status   Specimen Description   Final    URINE, RANDOM Performed at [facility], [address] [place], [place] [address]    Special Requests   Final    NONE Performed at [facility], [address] [place], [place] [address]    Culture MULTIPLE SPECIES PRESENT, SUGGEST RECOLLECTION (A)  Final   Report Status 06/06/2021 FINAL  Final  Resp Panel by RT-PCR (Flu A&B, Covid) Nasopharyngeal Swab     Status: None   Collection Time: 06/04/21  8:23 PM   Specimen: Nasopharyngeal Swab; Nasopharyngeal(NP) swabs in vial transport medium  Result Value Ref Range Status   SARS Coronavirus 2 by RT PCR NEGATIVE NEGATIVE Final    Comment: (NOTE) SARS-CoV-2 target nucleic acids are NOT DETECTED.  The SARS-CoV-2 RNA is generally detectable in upper respiratory specimens during the acute phase of infection. The  lowest concentration of SARS-CoV-2 viral copies this assay can detect is 138 copies/mL. A negative result does not preclude SARS-Cov-2 infection and should not be used as the sole basis for treatment or other patient management decisions. A negative result may occur with  improper specimen collection/handling, submission of specimen other than nasopharyngeal swab, presence of viral mutation(s) within the areas targeted by this assay, and inadequate number of viral copies(<138 copies/mL). A negative result must be combined with clinical observations, patient history, and epidemiological information. The expected  result is Negative.  Fact Sheet for Patients:  EntrepreneurPulse.com.au  Fact Sheet for Healthcare Providers:  IncredibleEmployment.be  This test is no t yet approved or cleared by the [place] FDA and  has been authorized for detection and/or diagnosis of SARS-CoV-2 by FDA under an Emergency Use Authorization (EUA). This EUA will remain  in effect (meaning this test can be used) for the duration of the COVID-19 declaration under Section 564(b)(1) of the Act, 21 U.S.C.section 360bbb-3(b)(1), unless the authorization is terminated  or revoked sooner.       Influenza A by PCR NEGATIVE NEGATIVE Final   Influenza B by PCR NEGATIVE NEGATIVE Final    Comment: (NOTE) The Xpert Xpress SARS-CoV-2/FLU/RSV plus assay is intended as an aid in the diagnosis of influenza from Nasopharyngeal swab specimens and should not be used as a sole basis for treatment. Nasal washings and aspirates are unacceptable for Xpert Xpress SARS-CoV-2/FLU/RSV testing.  Fact Sheet for Patients: EntrepreneurPulse.com.au  Fact Sheet for Healthcare Providers: IncredibleEmployment.be  This test is not yet approved or cleared by the [place] FDA and has been authorized for detection and/or diagnosis of SARS-CoV-2 by FDA under an Emergency Use Authorization (EUA). This EUA will remain in effect (meaning this test can be used) for the duration of the COVID-19 declaration under Section 564(b)(1) of the Act, 21 U.S.C. section 360bbb-3(b)(1), unless the authorization is terminated or revoked.  Performed at [facility], [address] [place], [place] [address]     Coagulation Studies: No results for input(s): "LABPROT", "INR" in the last 72 hours.  Urinalysis: No results for input(s): "COLORURINE", "LABSPEC", "PHURINE", "GLUCOSEU", "HGBUR", "BILIRUBINUR", "KETONESUR", "PROTEINUR", "UROBILINOGEN", "NITRITE", "LEUKOCYTESUR"  in the last 72 hours.  Invalid input(s): "APPERANCEUR"    Imaging: No results found.   Medications:    sodium chloride     furosemide (LASIX) 200 mg in dextrose 5 % 100 mL (2 mg/mL) infusion 4 mg/hr (08/24/22 1305)    enoxaparin (LOVENOX) injection  30 mg Subcutaneous Q24H   feeding supplement  237 mL Oral BID BM   ferrous sulfate  325 mg Oral BID WC   fluticasone furoate-vilanterol  1 puff Inhalation Daily   metoprolol succinate  25 mg Oral Daily   pantoprazole  40 mg Oral BID   sodium chloride flush  3 mL Intravenous Q12H   sodium chloride flush  3 mL Intravenous Q12H   sodium chloride, acetaminophen **OR** acetaminophen, albuterol, ondansetron (ZOFRAN) IV, sodium chloride flush, traMADol  Assessment/ Plan:  Anita [name] is a 61 y.o.  female with past medical conditions including gout, GERD, COPD, multiple sclerosis, CAD, systolic heart failure with EF 40 to 45%, who was admitted to [facility] on 08/21/2022 for Abdominal distension [R14.0] Elevated troponin [R77.8] Hypervolemia, unspecified hypervolemia type [E87.70]   Acute kidney injury on chronic kidney disease stage IIIa likely due to cardiorenal syndrome.  Baseline creatinine appears to be 1.13 with GFR 56 on 06/14/2021.  Patient currently not followed by  outpatient nephrology.  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  No recent IV contrast exposure.   Renal function remains stable on furosemide drip. Adequate urine output. Continue with current treatments.   Lab Results  Component Value Date   CREATININE 2.24 (H) 08/25/2022   CREATININE 2.25 (H) 08/24/2022   CREATININE 2.26 (H) 08/23/2022    Intake/Output Summary (Last 24 hours) at 08/25/2022 1407 Last data filed at 08/25/2022 0611 Gross per 24 hour  Intake --  Output 750 ml  Net -750 ml    2.  Acute on chronic systolic heart failure.  Echo completed this  admission shows EF 30 to 35% with mild LVH, severe mitral valve regurgitation and aortic valve calcification.  Cardiology following   3.  Acute metabolic acidosis.  Serum bicarb on admission 17. Resolved.   LOS: 3   9/18/20232:07 PM

## 2022-08-25 NOTE — TOC Progression Note (Signed)
Transition of Care [facility]) - Progression Note    Patient Details  Name: Anita [name] MRN: [id number] Date of Birth: 1961-06-30  Transition of Care [facility]) CM/SW [place], RN Phone Number: 08/25/2022, 1:28 PM  Clinical Narrative:    TOC to continue to monitor for needs,         Expected Discharge Plan and Services                                                 Social Determinants of Health (SDOH) Interventions    Readmission Risk Interventions    06/06/2021    9:43 AM  Readmission Risk Prevention Plan  Transportation Screening Complete  PCP or Specialist Appt within 3-5 Days Complete  HRI or [place] Complete  Social Work Consult for [place] Planning/Counseling Not Complete  SW consult not completed comments RNCM assigned to case  Palliative Care Screening Not Applicable  Medication Review [employer] photographer) Complete

## 2022-08-26 DIAGNOSIS — N189 Chronic kidney disease, unspecified: Secondary | ICD-10-CM | POA: Diagnosis not present

## 2022-08-26 DIAGNOSIS — I5043 Acute on chronic combined systolic (congestive) and diastolic (congestive) heart failure: Secondary | ICD-10-CM | POA: Diagnosis not present

## 2022-08-26 DIAGNOSIS — N179 Acute kidney failure, unspecified: Secondary | ICD-10-CM | POA: Diagnosis not present

## 2022-08-26 DIAGNOSIS — G35 Multiple sclerosis: Secondary | ICD-10-CM | POA: Diagnosis not present

## 2022-08-26 DIAGNOSIS — R601 Generalized edema: Secondary | ICD-10-CM | POA: Diagnosis not present

## 2022-08-26 LAB — COMPREHENSIVE METABOLIC PANEL
ALT: 197 U/L — ABNORMAL HIGH (ref 0–44)
AST: 76 U/L — ABNORMAL HIGH (ref 15–41)
Albumin: 3.5 g/dL (ref 3.5–5.0)
Alkaline Phosphatase: 113 U/L (ref 38–126)
Anion gap: 11 (ref 5–15)
BUN: 56 mg/dL — ABNORMAL HIGH (ref 6–20)
CO2: 28 mmol/L (ref 22–32)
Calcium: 8.5 mg/dL — ABNORMAL LOW (ref 8.9–10.3)
Chloride: 96 mmol/L — ABNORMAL LOW (ref 98–111)
Creatinine, Ser: 2.05 mg/dL — ABNORMAL HIGH (ref 0.44–1.00)
GFR, Estimated: 27 mL/min — ABNORMAL LOW (ref >60)
Glucose, Bld: 89 mg/dL (ref 70–99)
Potassium: 3.2 mmol/L — ABNORMAL LOW (ref 3.5–5.1)
Sodium: 135 mmol/L (ref 135–145)
Total Bilirubin: 1.7 mg/dL — ABNORMAL HIGH (ref 0.3–1.2)
Total Protein: 7.3 g/dL (ref 6.5–8.1)

## 2022-08-26 MED ORDER — ALBUMIN HUMAN 25 % IV SOLN
12.5000 g | Freq: Once | INTRAVENOUS | Status: AC
  Administered 2022-08-26: 12.5 g via INTRAVENOUS
  Filled 2022-08-26: qty 50

## 2022-08-26 MED ORDER — POTASSIUM CHLORIDE CRYS ER 20 MEQ PO TBCR
40.0000 meq | EXTENDED_RELEASE_TABLET | Freq: Once | ORAL | Status: AC
  Administered 2022-08-26: 40 meq via ORAL
  Filled 2022-08-26: qty 2

## 2022-08-26 NOTE — Plan of Care (Signed)

## 2022-08-26 NOTE — Progress Notes (Signed)
[facility] Progress Note  Patient: Anita [name]    BTD:[id number]  DOA: 08/21/2022    Date of Service: the patient was seen and examined on 08/26/2022  Brief hospital course: 61 year old female with past medical history of multiple sclerosis and systolic/diastolic heart failure with moderate to severe tricuspid regurg and severe mitral regurg presented to the emergency room on 9/14 with complaints of increased facial and abdominal swelling for the past few weeks.  Patient is on diuretics, but has not been taking them as of late.  In the emergency room, patient found to have acute kidney injury with a creatinine of 2.73, BNP of greater than 4500, troponin of 600 and she was given a dose of Lasix in the emergency room.  Cardiology and nephrology consulted.  Following suboptimal response to doses of IV Lasix, patient changed over to Lasix drip.  Assessment and Plan: Assessment and Plan: Acute on chronic combined systolic and diastolic CHF (congestive heart failure) [facility]) Cardiology following.  Echocardiogram done 9/15 notes ejection fraction of 30 to 35% with severe tricuspid and mitral valve regurg.  Global hypokinesis.  Indeterminate diastolic function.  This looks to be worse than previous echocardiogram done 15 months ago.  Has diuresed about a liter of fluid so far.  Minimum change from previous day, so changed to Lasix drip on 9/17 as per nephrology recommendations.  Since then, patient has diuresed over 4 L and is almost -4 L deficient.  Please note that as per cardiology's evaluations, these are limited short-term interventions, as long-term, patient has limited options as she is not a can for valve repair/replacement and ARB/ACE inhibitor not felt to be indicated given her renal function.  Anasarca Secondary to CHF.  Right ventricular failure causing transaminitis and overall hepatic congestion.  As above.  Improving.  Acute kidney injury superimposed on stage IIIa chronic kidney  disease ([place]) Secondary to decompensated heart failure.  Baseline creatinine around 1.13.  Nephrology following.  No indication for dialysis.  Creatinine slowly improving, down to 2.05 today.  COPD (chronic obstructive pulmonary disease) (HCC) Stable, denies any shortness of breath  Essential hypertension Elevated diastolic blood pressures in part due to right ventricular failure.  Numbers improved with diuresis.  Transaminitis Secondary to hepatic congestion from heart failure.  Continues to improve as she diureses  Elevated troponin Demand ischemia versus elevations due to renal failure and heart failure.  Cardiology following.  Continue to cycle.  Patient denies any chest pain at this time.  Multiple sclerosis ([place]) Stable at this time.  Not on any medications.  Coronary artery disease involving native coronary artery of native heart without angina pectoris Given decrease in ejection fraction, cardiology discussed with patient about whether or not she wants ischemic work-up.  Tobacco abuse Have provided counseling. Nicotine patch.        Body mass index is 24.88 kg/m.        Consultants: Cardiology Nephrology Palliative care  Procedures: Echocardiogram noting worsening ejection fraction of 30 to 35%, severe mitral and tricuspid regurg, severe pulmonary hypertension and indeterminate diastolic function  Antimicrobials: None  Code Status: Full code   Subjective: Patient okay, says she feels about the same, less swelling  Objective: Vital signs were reviewed and unremarkable. Vitals:   08/26/22 0444 08/26/22 0908  BP: 136/76 112/62  Pulse: 71 63  Resp: 19 16  Temp: 97.7 F (36.5 C) 97.7 F (36.5 C)  SpO2: 100% 96%    Intake/Output Summary (Last 24 hours) at 08/26/2022 1416 Last  data filed at 08/26/2022 1259 Gross per 24 hour  Intake 52.63 ml  Output 2300 ml  Net -2247.37 ml    Filed Weights   08/23/22 0415 08/24/22 0500 08/25/22 0610  Weight: 64  kg 63.7 kg 63.7 kg   Body mass index is 24.88 kg/m.  Exam:  General: Alert and oriented x3, no acute distress HEENT: Normocephalic, atraumatic, much less swelling about the face Cardiovascular: Regular rate and rhythm, S1-S2, 2 out of 6 systolic ejection murmur Respiratory: Decreased breath sounds throughout Abdomen: Soft, nontender, mildly decreased distention, hypoactive bowel sounds Musculoskeletal: No clubbing or cyanosis, trace pitting edema Skin: No skin breaks, tears or lesions Psychiatry: Appropriate, no evidence of psychoses Neurology: No focal deficits  Data Reviewed: Continued improvement in renal function and transaminases  Disposition:  Status is: Inpatient Remains inpatient appropriate because:  -Further diuresis    Anticipated discharge date: 9/21   Family Communication: Left message for daughter DVT Prophylaxis: enoxaparin (LOVENOX) injection 30 mg Start: 08/22/22 2200  Lovenox    Author: Annita Brod ,MD 08/26/2022 2:16 PM  To reach On-call, see care teams to locate the attending and reach out via [email or web address]. Between 7PM-7AM, please contact night-coverage If you still have difficulty reaching the attending provider, please page the [facility] (Director on Call) for [facility] on amion for assistance.

## 2022-08-26 NOTE — Progress Notes (Signed)
Rounding Note    Patient Name: Anita [name] Date of Encounter: 08/26/2022  [place] Cardiologist: Buford Dresser, MD   Subjective   Patient seen on AM rounds. Denies any chest pain or worsening shortness of breath and continues to endorse abdominal discomfort.  -1.9 L output in the last 24 hours. K+ 3.2 this morning.   Inpatient Medications    Scheduled Meds:  enoxaparin (LOVENOX) injection  30 mg Subcutaneous Q24H   feeding supplement  237 mL Oral BID BM   ferrous sulfate  325 mg Oral BID WC   fluticasone furoate-vilanterol  1 puff Inhalation Daily   metoprolol succinate  25 mg Oral Daily   pantoprazole  40 mg Oral BID   sodium chloride flush  3 mL Intravenous Q12H   sodium chloride flush  3 mL Intravenous Q12H   Continuous Infusions:  sodium chloride     furosemide (LASIX) 200 mg in dextrose 5 % 100 mL (2 mg/mL) infusion 4 mg/hr (08/25/22 1540)   PRN Meds: sodium chloride, acetaminophen **OR** acetaminophen, albuterol, ondansetron (ZOFRAN) IV, sodium chloride flush, traMADol   Vital Signs    Vitals:   08/25/22 1710 08/25/22 2006 08/26/22 0444 08/26/22 0908  BP: 119/80 127/83 136/76 112/62  Pulse: 66 73 71 63  Resp: '16 16 19 16  '$ Temp: 97.9 F (36.6 C) (!) 97.2 F (36.2 C) 97.7 F (36.5 C) 97.7 F (36.5 C)  TempSrc:   Oral   SpO2: 100% 98% 100% 96%  Weight:      Height:        Intake/Output Summary (Last 24 hours) at 08/26/2022 1008 Last data filed at 08/26/2022 0910 Gross per 24 hour  Intake 52.63 ml  Output 2050 ml  Net -1997.37 ml      08/25/2022    6:10 AM 08/24/2022    5:00 AM 08/23/2022    4:15 AM  Last 3 Weights  Weight (lbs) 140 lb 6.9 oz 140 lb 6.9 oz 141 lb 1.5 oz  Weight (kg) 63.7 kg 63.7 kg 64 kg      Telemetry    Sinus rates of 60's - Personally Reviewed  ECG    No new tracings - Personally Reviewed  Physical Exam   GEN: No acute distress.   Neck:  JVD noted Cardiac: RRR, II-III/VI systolic murmur,  without rubs, or gallops.  Respiratory: Diminished to auscultation bilaterally. Respirations are unlabored at rest on room air GI: Soft, nontender, distended  MS: No edema; No deformity. Neuro:  Nonfocal  Psych: Normal affect   Labs    High Sensitivity Troponin:   Recent Labs  Lab 08/21/22 1705 08/21/22 1840 08/23/22 0534  TROPONINIHS 601* 521* 122*     Chemistry Recent Labs  Lab 08/24/22 0508 08/25/22 0626 08/26/22 0427  NA 132* 130* 135  K 4.2 4.5 3.2*  CL 99 96* 96*  CO2 '22 23 28  '$ GLUCOSE 94 100* 89  BUN 65* 60* 56*  CREATININE 2.25* 2.24* 2.05*  CALCIUM 8.7* 8.3* 8.5*  PROT 7.5 7.0 7.3  ALBUMIN 3.6 3.4* 3.5  AST 214* 120* 76*  ALT 401* 253* 197*  ALKPHOS 117 112 113  BILITOT 1.8* 2.1* 1.7*  GFRNONAA 25* 25* 27*  ANIONGAP '11 11 11    '$ Lipids No results for input(s): "CHOL", "TRIG", "HDL", "LABVLDL", "LDLCALC", "CHOLHDL" in the last 168 hours.  Hematology Recent Labs  Lab 08/21/22 1705 08/22/22 0412  WBC 9.7 9.1  RBC 3.97 3.85*  HGB 10.6* 10.6*  HCT  36.0 34.5*  MCV 90.7 89.6  MCH 26.7 27.5  MCHC 29.4* 30.7  RDW 17.8* 17.9*  PLT 227 192   Thyroid No results for input(s): "TSH", "FREET4" in the last 168 hours.  BNP Recent Labs  Lab 08/21/22 1705  BNP >4,500.0*    DDimer No results for input(s): "DDIMER" in the last 168 hours.   Radiology    No results found.  Cardiac Studies  TTE 08/22/22 1. Left ventricular ejection fraction, by estimation, is 30 to 35%. The  left ventricle has moderately decreased function. The left ventricle  demonstrates global hypokinesis. There is mild left ventricular  hypertrophy. Left ventricular diastolic  parameters are indeterminate.   2. Right ventricular systolic function is severely reduced. The right  ventricular size is moderately enlarged. Tricuspid regurgitation signal is  inadequate for assessing PA pressure.   3. Right atrial size was moderately dilated.   4. The mitral valve is normal in structure.  Severe mitral valve  regurgitation. No evidence of mitral stenosis.   5. Tricuspid valve regurgitation is severe.   6. The aortic valve is normal in structure. Aortic valve regurgitation is  mild. Aortic valve sclerosis/calcification is present, without any  evidence of aortic stenosis.   7. The inferior vena cava is normal in size with greater than 50%  respiratory variability, suggesting right atrial pressure of 3 mmHg.   Patient Profile     61 y.o. female with a history of multiple sclerosis, CAD, severe MR with moderate mitral stenosis, COPD, prior tobacco ise, who is being seen and elevated for CHF.   Assessment & Plan    Ascites -CTA abdomen/pelvis showed large volume ascites, small bilateral pleural effusion, left retroperitoneal mass -lasix per nephrology -continued on O2 with improved breathing reported -recommend palliative care consult to determine goals of treatment    Ischemic cardiomyopathy/ acute on chronic systolic and diastolic heart failure -LVEF 30-35% -bnp greater than 4500 -dieresis per Nephrology -continue toprol xl 25 mg daily - -1.9L output in the last 24 hours -daily weight. I&O, low sodium diet  Elevated Hs troponins/ coronary artery disease with CTO RCA -currently chest pain free -Hs troponins peaked at 601 -echocardiogram revealed worsening LV function -can consider Lexiscan later if patient is agreeable, but continues to be a poor candidate for LHC due to AKI/CKD  Severe MR and moderate MS -positive for heart murmur on exam -previously felt not to be a candidate for Mitraclip -patient has refused work-up in the past -LVEF 30-35%, severe MR, severe TR, Right ventricular function severely reduced  AKI on CKD -serum creatinine 2.05 -baseline creatinine 1.13 -receiving albumin this morning with lasix drip on hold -lasix drip and dieresis per Nephrology -monitor urine output -daily bmp -avoid nephrotoxic agents when able  6.     Hypokalemia -potassium 3.2 -ordered k-dur 40 mEq times one dose -potassium supplementation to keep potassium closer to 4 -daily bmp -monitor/trend/replete electrolytes as needed     For questions or updates, please contact [place] Please consult [email or web address] for contact info under        Signed, Agnieszka Newhouse, NP  08/26/2022, 10:08 AM

## 2022-08-26 NOTE — Progress Notes (Signed)
[facility]  ROUNDING NOTE   Subjective:   Patient sitting up in bed Alert and oriented Abdomen remains distended and tender Mild lower extremity edema   Objective:  Vital signs in last 24 hours:  Temp:  [97.2 F (36.2 C)-97.9 F (36.6 C)] 97.7 F (36.5 C) (09/19 0908) Pulse Rate:  [63-73] 63 (09/19 0908) Resp:  [16-19] 16 (09/19 0908) BP: (112-136)/(62-83) 112/62 (09/19 0908) SpO2:  [96 %-100 %] 96 % (09/19 0908)  Weight change:  Filed Weights   08/23/22 0415 08/24/22 0500 08/25/22 0610  Weight: 64 kg 63.7 kg 63.7 kg    Intake/Output: I/O last 3 completed shifts: In: 52.6 [I.V.:52.6] Out: 2400 [Urine:2400]   Intake/Output this shift:  Total I/O In: -  Out: 400 [Urine:400]  Physical Exam: General: NAD, resting comfortably  Head: Normocephalic, atraumatic. Moist oral mucosal membranes  Eyes: Anicteric  Lungs:  Diminished in bases, normal effort  Heart: Regular rate and rhythm, murmur  Abdomen:  Soft, tender, moderate distention  Extremities: Trace peripheral edema.  Neurologic: Nonfocal, moving all four extremities  Skin: No lesions  Access: None    Basic Metabolic Panel: Recent Labs  Lab 08/22/22 0412 08/23/22 0534 08/24/22 0508 08/25/22 0626 08/26/22 0427  NA 136 134* 132* 130* 135  K 3.7 4.2 4.2 4.5 3.2*  CL 104 102 99 96* 96*  CO2 21* 20* '22 23 28  '$ GLUCOSE 107* 95 94 100* 89  BUN 66* 63* 65* 60* 56*  CREATININE 2.60* 2.26* 2.25* 2.24* 2.05*  CALCIUM 8.5* 8.8* 8.7* 8.3* 8.5*     Liver Function Tests: Recent Labs  Lab 08/22/22 0412 08/23/22 0534 08/24/22 0508 08/25/22 0626 08/26/22 0427  AST 464* 326* 214* 120* 76*  ALT 559* 482* 401* 253* 197*  ALKPHOS 118 116 117 112 113  BILITOT 1.5* 1.7* 1.8* 2.1* 1.7*  PROT 7.1 7.3 7.5 7.0 7.3  ALBUMIN 3.5 3.5 3.6 3.4* 3.5    No results for input(s): "LIPASE", "AMYLASE" in the last 168 hours. No results for input(s): "AMMONIA" in the last 168 hours.  CBC: Recent Labs  Lab  08/21/22 1705 08/22/22 0412  WBC 9.7 9.1  HGB 10.6* 10.6*  HCT 36.0 34.5*  MCV 90.7 89.6  PLT 227 192     Cardiac Enzymes: No results for input(s): "CKTOTAL", "CKMB", "CKMBINDEX", "TROPONINI" in the last 168 hours.  BNP: Invalid input(s): "POCBNP"  CBG: No results for input(s): "GLUCAP" in the last 168 hours.  Microbiology: Results for orders placed or performed during the hospital encounter of 06/04/21  Urine Culture     Status: Abnormal   Collection Time: 06/04/21  2:56 PM   Specimen: Urine, Random  Result Value Ref Range Status   Specimen Description   Final    URINE, RANDOM Performed at [facility] [address], [address], [place], [place] [address]    Special Requests   Final    NONE Performed at [facility], [address] [place], [place] [address]    Culture MULTIPLE SPECIES PRESENT, SUGGEST RECOLLECTION (A)  Final   Report Status 06/06/2021 FINAL  Final  Resp Panel by RT-PCR (Flu A&B, Covid) Nasopharyngeal Swab     Status: None   Collection Time: 06/04/21  8:23 PM   Specimen: Nasopharyngeal Swab; Nasopharyngeal(NP) swabs in vial transport medium  Result Value Ref Range Status   SARS Coronavirus 2 by RT PCR NEGATIVE NEGATIVE Final    Comment: (NOTE) SARS-CoV-2 target nucleic acids are NOT DETECTED.  The SARS-CoV-2 RNA is  generally detectable in upper respiratory specimens during the acute phase of infection. The lowest concentration of SARS-CoV-2 viral copies this assay can detect is 138 copies/mL. A negative result does not preclude SARS-Cov-2 infection and should not be used as the sole basis for treatment or other patient management decisions. A negative result may occur with  improper specimen collection/handling, submission of specimen other than nasopharyngeal swab, presence of viral mutation(s) within the areas targeted by this assay, and inadequate number of viral copies(<138 copies/mL). A negative result must be combined  with clinical observations, patient history, and epidemiological information. The expected result is Negative.  Fact Sheet for Patients:  EntrepreneurPulse.com.au  Fact Sheet for Healthcare Providers:  IncredibleEmployment.be  This test is no t yet approved or cleared by the [place] FDA and  has been authorized for detection and/or diagnosis of SARS-CoV-2 by FDA under an Emergency Use Authorization (EUA). This EUA will remain  in effect (meaning this test can be used) for the duration of the COVID-19 declaration under Section 564(b)(1) of the Act, 21 U.S.C.section 360bbb-3(b)(1), unless the authorization is terminated  or revoked sooner.       Influenza A by PCR NEGATIVE NEGATIVE Final   Influenza B by PCR NEGATIVE NEGATIVE Final    Comment: (NOTE) The Xpert Xpress SARS-CoV-2/FLU/RSV plus assay is intended as an aid in the diagnosis of influenza from Nasopharyngeal swab specimens and should not be used as a sole basis for treatment. Nasal washings and aspirates are unacceptable for Xpert Xpress SARS-CoV-2/FLU/RSV testing.  Fact Sheet for Patients: EntrepreneurPulse.com.au  Fact Sheet for Healthcare Providers: IncredibleEmployment.be  This test is not yet approved or cleared by the [place] FDA and has been authorized for detection and/or diagnosis of SARS-CoV-2 by FDA under an Emergency Use Authorization (EUA). This EUA will remain in effect (meaning this test can be used) for the duration of the COVID-19 declaration under Section 564(b)(1) of the Act, 21 U.S.C. section 360bbb-3(b)(1), unless the authorization is terminated or revoked.  Performed at [facility], [address] [place], [place] [address]     Coagulation Studies: No results for input(s): "LABPROT", "INR" in the last 72 hours.  Urinalysis: No results for input(s): "COLORURINE", "LABSPEC", "PHURINE",  "GLUCOSEU", "HGBUR", "BILIRUBINUR", "KETONESUR", "PROTEINUR", "UROBILINOGEN", "NITRITE", "LEUKOCYTESUR" in the last 72 hours.  Invalid input(s): "APPERANCEUR"    Imaging: No results found.   Medications:    sodium chloride     furosemide (LASIX) 200 mg in dextrose 5 % 100 mL (2 mg/mL) infusion 4 mg/hr (08/26/22 1052)    enoxaparin (LOVENOX) injection  30 mg Subcutaneous Q24H   feeding supplement  237 mL Oral BID BM   ferrous sulfate  325 mg Oral BID WC   fluticasone furoate-vilanterol  1 puff Inhalation Daily   metoprolol succinate  25 mg Oral Daily   pantoprazole  40 mg Oral BID   potassium chloride  40 mEq Oral Once   sodium chloride flush  3 mL Intravenous Q12H   sodium chloride flush  3 mL Intravenous Q12H   sodium chloride, acetaminophen **OR** acetaminophen, albuterol, ondansetron (ZOFRAN) IV, sodium chloride flush, traMADol  Assessment/ Plan:  [name] is a 61 y.o.  female with past medical conditions including gout, GERD, COPD, multiple sclerosis, CAD, systolic heart failure with EF 40 to 45%, who was admitted to [facility] on 08/21/2022 for Abdominal distension [R14.0] Elevated troponin [R77.8] Hypervolemia, unspecified hypervolemia type [E87.70]   Acute kidney injury on chronic kidney disease stage IIIa likely  due to cardiorenal syndrome.  Baseline creatinine appears to be 1.13 with GFR 56 on 06/14/2021.  Patient currently not followed by outpatient nephrology.  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  No recent IV contrast exposure.   Creatinine continues to improve. Urine output 1.6L. Will continue current treatments and monitor renal function.   Lab Results  Component Value Date   CREATININE 2.05 (H) 08/26/2022   CREATININE 2.24 (H) 08/25/2022   CREATININE 2.25 (H) 08/24/2022    Intake/Output Summary (Last 24 hours) at 08/26/2022 1228 Last data filed at  08/26/2022 0910 Gross per 24 hour  Intake 52.63 ml  Output 2050 ml  Net -1997.37 ml     2.  Acute on chronic systolic heart failure.  Echo completed this admission shows EF 30 to 35% with mild LVH, severe mitral valve regurgitation and aortic valve calcification.    Cardiology continues to follow   3.  Acute metabolic acidosis.  Serum bicarb on admission 17. Resolved.   LOS: [place] 9/19/202312:28 PM

## 2022-08-27 DIAGNOSIS — R778 Other specified abnormalities of plasma proteins: Secondary | ICD-10-CM | POA: Diagnosis not present

## 2022-08-27 DIAGNOSIS — E876 Hypokalemia: Secondary | ICD-10-CM

## 2022-08-27 DIAGNOSIS — N179 Acute kidney failure, unspecified: Secondary | ICD-10-CM | POA: Diagnosis not present

## 2022-08-27 DIAGNOSIS — I5043 Acute on chronic combined systolic (congestive) and diastolic (congestive) heart failure: Secondary | ICD-10-CM | POA: Diagnosis not present

## 2022-08-27 DIAGNOSIS — I5023 Acute on chronic systolic (congestive) heart failure: Secondary | ICD-10-CM | POA: Diagnosis not present

## 2022-08-27 DIAGNOSIS — K219 Gastro-esophageal reflux disease without esophagitis: Secondary | ICD-10-CM

## 2022-08-27 DIAGNOSIS — R601 Generalized edema: Secondary | ICD-10-CM | POA: Diagnosis not present

## 2022-08-27 DIAGNOSIS — Z720 Tobacco use: Secondary | ICD-10-CM

## 2022-08-27 LAB — BASIC METABOLIC PANEL
Anion gap: 10 (ref 5–15)
BUN: 46 mg/dL — ABNORMAL HIGH (ref 6–20)
CO2: 28 mmol/L (ref 22–32)
Calcium: 8.4 mg/dL — ABNORMAL LOW (ref 8.9–10.3)
Chloride: 96 mmol/L — ABNORMAL LOW (ref 98–111)
Creatinine, Ser: 1.76 mg/dL — ABNORMAL HIGH (ref 0.44–1.00)
GFR, Estimated: 33 mL/min — ABNORMAL LOW (ref >60)
Glucose, Bld: 80 mg/dL (ref 70–99)
Potassium: 3.1 mmol/L — ABNORMAL LOW (ref 3.5–5.1)
Sodium: 134 mmol/L — ABNORMAL LOW (ref 135–145)

## 2022-08-27 MED ORDER — TORSEMIDE 20 MG PO TABS
40.0000 mg | ORAL_TABLET | Freq: Every day | ORAL | Status: DC
  Administered 2022-08-27 – 2022-08-30 (×4): 40 mg via ORAL
  Filled 2022-08-27 (×4): qty 2

## 2022-08-27 MED ORDER — ENOXAPARIN SODIUM 40 MG/0.4ML IJ SOSY
40.0000 mg | PREFILLED_SYRINGE | INTRAMUSCULAR | Status: DC
  Administered 2022-08-27 – 2022-08-31 (×5): 40 mg via SUBCUTANEOUS
  Filled 2022-08-27 (×5): qty 0.4

## 2022-08-27 MED ORDER — POTASSIUM CHLORIDE CRYS ER 20 MEQ PO TBCR
40.0000 meq | EXTENDED_RELEASE_TABLET | Freq: Two times a day (BID) | ORAL | Status: AC
  Administered 2022-08-27 (×2): 40 meq via ORAL
  Filled 2022-08-27 (×2): qty 2

## 2022-08-27 MED ORDER — OXYCODONE HCL 5 MG PO TABS
5.0000 mg | ORAL_TABLET | Freq: Four times a day (QID) | ORAL | Status: DC | PRN
  Administered 2022-08-27 – 2022-09-01 (×11): 5 mg via ORAL
  Filled 2022-08-27 (×11): qty 1

## 2022-08-27 NOTE — Progress Notes (Signed)
[facility]  ROUNDING NOTE   Subjective:   Patient seen resting in bed, alert and oriented Continues to complain of abdominal tenderness but feels abdominal girth has decreased slightly. Appetite remains appropriate   Objective:  Vital signs in last 24 hours:  Temp:  [97.5 F (36.4 C)-97.7 F (36.5 C)] 97.5 F (36.4 C) (09/20 0840) Pulse Rate:  [65-67] 67 (09/20 0840) Resp:  [16-19] 16 (09/20 0840) BP: (115-134)/(79-85) 134/84 (09/20 0840) SpO2:  [100 %] 100 % (09/20 0840)  Weight change:  Filed Weights   08/23/22 0415 08/24/22 0500 08/25/22 0610  Weight: 64 kg 63.7 kg 63.7 kg    Intake/Output: I/O last 3 completed shifts: In: 240 [P.O.:240] Out: 3750 [Urine:3750]   Intake/Output this shift:  Total I/O In: 120 [P.O.:120] Out: 900 [Urine:900]  Physical Exam: General: NAD, resting comfortably  Head: Normocephalic, atraumatic. Moist oral mucosal membranes  Eyes: Anicteric  Lungs:  Diminished in bases, normal effort  Heart: Regular rate and rhythm, murmur  Abdomen:  Soft, tender, moderate distention  Extremities: No peripheral edema.  Neurologic: Nonfocal, moving all four extremities  Skin: No lesions  Access: None    Basic Metabolic Panel: Recent Labs  Lab 08/23/22 0534 08/24/22 0508 08/25/22 0626 08/26/22 0427 08/27/22 0641  NA 134* 132* 130* 135 134*  K 4.2 4.2 4.5 3.2* 3.1*  CL 102 99 96* 96* 96*  CO2 20* '22 23 28 28  '$ GLUCOSE 95 94 100* 89 80  BUN 63* 65* 60* 56* 46*  CREATININE 2.26* 2.25* 2.24* 2.05* 1.76*  CALCIUM 8.8* 8.7* 8.3* 8.5* 8.4*     Liver Function Tests: Recent Labs  Lab 08/22/22 0412 08/23/22 0534 08/24/22 0508 08/25/22 0626 08/26/22 0427  AST 464* 326* 214* 120* 76*  ALT 559* 482* 401* 253* 197*  ALKPHOS 118 116 117 112 113  BILITOT 1.5* 1.7* 1.8* 2.1* 1.7*  PROT 7.1 7.3 7.5 7.0 7.3  ALBUMIN 3.5 3.5 3.6 3.4* 3.5    No results for input(s): "LIPASE", "AMYLASE" in the last 168 hours. No results for  input(s): "AMMONIA" in the last 168 hours.  CBC: Recent Labs  Lab 08/21/22 1705 08/22/22 0412  WBC 9.7 9.1  HGB 10.6* 10.6*  HCT 36.0 34.5*  MCV 90.7 89.6  PLT 227 192     Cardiac Enzymes: No results for input(s): "CKTOTAL", "CKMB", "CKMBINDEX", "TROPONINI" in the last 168 hours.  BNP: Invalid input(s): "POCBNP"  CBG: No results for input(s): "GLUCAP" in the last 168 hours.  Microbiology: Results for orders placed or performed during the hospital encounter of 06/04/21  Urine Culture     Status: Abnormal   Collection Time: 06/04/21  2:56 PM   Specimen: Urine, Random  Result Value Ref Range Status   Specimen Description   Final    URINE, RANDOM Performed at [facility], [address] [place], [place] [address]    Special Requests   Final    NONE Performed at [facility], [address] [place], [place] [address]    Culture MULTIPLE SPECIES PRESENT, SUGGEST RECOLLECTION (A)  Final   Report Status 06/06/2021 FINAL  Final  Resp Panel by RT-PCR (Flu A&B, Covid) Nasopharyngeal Swab     Status: None   Collection Time: 06/04/21  8:23 PM   Specimen: Nasopharyngeal Swab; Nasopharyngeal(NP) swabs in vial transport medium  Result Value Ref Range Status   SARS Coronavirus 2 by RT PCR NEGATIVE NEGATIVE Final    Comment: (NOTE) SARS-CoV-2 target nucleic acids are NOT  DETECTED.  The SARS-CoV-2 RNA is generally detectable in upper respiratory specimens during the acute phase of infection. The lowest concentration of SARS-CoV-2 viral copies this assay can detect is 138 copies/mL. A negative result does not preclude SARS-Cov-2 infection and should not be used as the sole basis for treatment or other patient management decisions. A negative result may occur with  improper specimen collection/handling, submission of specimen other than nasopharyngeal swab, presence of viral mutation(s) within the areas targeted by this assay, and inadequate number of  viral copies(<138 copies/mL). A negative result must be combined with clinical observations, patient history, and epidemiological information. The expected result is Negative.  Fact Sheet for Patients:  EntrepreneurPulse.com.au  Fact Sheet for Healthcare Providers:  IncredibleEmployment.be  This test is no t yet approved or cleared by the [place] FDA and  has been authorized for detection and/or diagnosis of SARS-CoV-2 by FDA under an Emergency Use Authorization (EUA). This EUA will remain  in effect (meaning this test can be used) for the duration of the COVID-19 declaration under Section 564(b)(1) of the Act, 21 U.S.C.section 360bbb-3(b)(1), unless the authorization is terminated  or revoked sooner.       Influenza A by PCR NEGATIVE NEGATIVE Final   Influenza B by PCR NEGATIVE NEGATIVE Final    Comment: (NOTE) The Xpert Xpress SARS-CoV-2/FLU/RSV plus assay is intended as an aid in the diagnosis of influenza from Nasopharyngeal swab specimens and should not be used as a sole basis for treatment. Nasal washings and aspirates are unacceptable for Xpert Xpress SARS-CoV-2/FLU/RSV testing.  Fact Sheet for Patients: EntrepreneurPulse.com.au  Fact Sheet for Healthcare Providers: IncredibleEmployment.be  This test is not yet approved or cleared by the [place] FDA and has been authorized for detection and/or diagnosis of SARS-CoV-2 by FDA under an Emergency Use Authorization (EUA). This EUA will remain in effect (meaning this test can be used) for the duration of the COVID-19 declaration under Section 564(b)(1) of the Act, 21 U.S.C. section 360bbb-3(b)(1), unless the authorization is terminated or revoked.  Performed at [facility], [address] [place], [place] [address]     Coagulation Studies: No results for input(s): "LABPROT", "INR" in the last 72 hours.  Urinalysis: No  results for input(s): "COLORURINE", "LABSPEC", "PHURINE", "GLUCOSEU", "HGBUR", "BILIRUBINUR", "KETONESUR", "PROTEINUR", "UROBILINOGEN", "NITRITE", "LEUKOCYTESUR" in the last 72 hours.  Invalid input(s): "APPERANCEUR"    Imaging: No results found.   Medications:    sodium chloride      enoxaparin (LOVENOX) injection  40 mg Subcutaneous Q24H   feeding supplement  237 mL Oral BID BM   ferrous sulfate  325 mg Oral BID WC   fluticasone furoate-vilanterol  1 puff Inhalation Daily   metoprolol succinate  25 mg Oral Daily   pantoprazole  40 mg Oral BID   potassium chloride  40 mEq Oral BID   sodium chloride flush  3 mL Intravenous Q12H   sodium chloride flush  3 mL Intravenous Q12H   torsemide  40 mg Oral Daily   sodium chloride, acetaminophen **OR** acetaminophen, albuterol, ondansetron (ZOFRAN) IV, oxyCODONE, sodium chloride flush, traMADol  Assessment/ Plan:  Ms. REGHAN THUL is a 61 y.o.  female with past medical conditions including gout, GERD, COPD, multiple sclerosis, CAD, systolic heart failure with EF 40 to 45%, who was admitted to [facility] on 08/21/2022 for Abdominal distension [R14.0] Elevated troponin [R77.8] Hypervolemia, unspecified hypervolemia type [E87.70]   Acute kidney injury on chronic kidney disease stage IIIa likely due to cardiorenal syndrome.  Baseline creatinine appears to be 1.13 with GFR 56 on 06/14/2021.  Patient currently not followed by outpatient nephrology.  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  No recent IV contrast exposure.   Renal function continues to improve.  Adequate urine output recorded of 2.1 L.  We will stop furosemide drip today and transition to torsemide 40 mg daily.  We will continue to assess daily.  Lab Results  Component Value Date   CREATININE 1.76 (H) 08/27/2022   CREATININE 2.05 (H) 08/26/2022   CREATININE 2.24 (H) 08/25/2022     Intake/Output Summary (Last 24 hours) at 08/27/2022 1228 Last data filed at 08/27/2022 1016 Gross per 24 hour  Intake 360 ml  Output 2600 ml  Net -2240 ml     2.  Acute on chronic systolic heart failure.  Echo completed this admission shows EF 30 to 35% with mild LVH, severe mitral valve regurgitation and aortic valve calcification.    Cardiology following   3.  Acute metabolic acidosis.  Serum bicarb on admission 17. Resolved.   LOS: 5   9/20/202312:28 PM

## 2022-08-27 NOTE — Assessment & Plan Note (Signed)
Monitor periodic CBC, follow outpatient

## 2022-08-27 NOTE — Progress Notes (Signed)
[facility] Progress Note  Patient: Anita [name]    CXK:[id number]  DOA: 08/21/2022    Date of Service: the patient was seen and examined on 08/27/2022  Brief hospital course: 61 year old female with past medical history of multiple sclerosis and systolic/diastolic heart failure with moderate to severe tricuspid regurg and severe mitral regurg presented to the emergency room on 9/14 with complaints of increased facial and abdominal swelling for the past few weeks.  Patient is on diuretics, but has not been taking them as of late.  In the emergency room, patient found to have acute kidney injury with a creatinine of 2.73, BNP of greater than 4500, troponin of 600 and she was given a dose of Lasix in the emergency room.  Cardiology and nephrology consulted.  Following suboptimal response to doses of IV Lasix, patient changed over to Lasix drip to treat volume overload driven by biventricular failure. Per cardiology, unable to add ACE/ARB/ARNI/aldosterone antagonist d/t renal function, and she is not a candidate for repair of valvular disease - cardiac treatment options limited. Palliative care following.  09/20: Renal function improving somewhat, remains on Lasix drip.  Assessment and Plan:  Active Problems:   Acute on chronic combined systolic and diastolic CHF (congestive heart failure) (HCC)   Anasarca   Acute kidney injury superimposed on stage IIIa chronic kidney disease (HCC)   COPD (chronic obstructive pulmonary disease) (HCC)   Essential hypertension   Elevated troponin   Transaminitis   Multiple sclerosis (HCC)   Coronary artery disease involving native coronary artery of native heart without angina pectoris   Gastroesophageal reflux disease   IDA (iron deficiency anemia)   Tobacco abuse   Hypokalemia   Acute on chronic combined systolic and diastolic CHF (congestive heart failure) [facility]) Cardiology following.  Echocardiogram done 9/15 notes ejection fraction of 30 to  35% with severe tricuspid and mitral valve regurg.  Global hypokinesis.  Indeterminate diastolic function.  This looks to be worse than previous echocardiogram done 15 months ago.  After minimal diuresis, pt was placed on Lasix drip on 9/17 as per nephrology recommendations.  Since then, patient has diuresed over 4 L and is almost -4 L deficient, Net IO Since Admission: -5,017.37 mL [08/27/22 0724].  Please note that as per cardiology's evaluations, these are limited short-term interventions, as long-term, patient has limited options as she is not a can for valve repair/replacement and ARB/ACE inhibitor not felt to be indicated given her renal function but may be able to add this if renal function improves / BP allows.   Anasarca Secondary to CHF.  Right ventricular failure causing transaminitis and overall hepatic congestion.  As above.  Improving.  Acute kidney injury superimposed on stage IIIa chronic kidney disease ([place]) Secondary to decompensated heart failure.  Baseline creatinine around 1.13.  Nephrology following.  No indication for dialysis.  Creatinine slowly improving, down to 2.05 yesterday and 1.7 today.  COPD (chronic obstructive pulmonary disease) (HCC) Stable, denies any shortness of breath  Essential hypertension Elevated diastolic blood pressures in part due to right ventricular failure.  Numbers improved with diuresis. Consider ACE/ARB/ARNI/aldosterone antagonist per renal function/nephrology and per cardiology  Transaminitis Secondary to hepatic congestion from heart failure.  Continues to improve as she diureses. Can follow outpatient.   Elevated troponin Demand ischemia versus elevations due to renal failure and heart failure.  Cardiology following.  Cycle as needed.  Patient denies any chest pain at this time.  Multiple sclerosis ([place]) Stable at this time.  Not  on any medications.  Coronary artery disease involving native coronary artery of native heart without angina  pectoris Cardiology following. At this point no apparent plans for ischemic workup.   IDA (iron deficiency anemia) Monitor periodic CBC, follow outpatient   Tobacco abuse Have provided counseling. Nicotine patch.   Hypokalemia Likely d/t lasix, will replete and continue to monitor BMP   Body mass index is 24.88 kg/m.        Consultants: Cardiology Nephrology Palliative care  Procedures: Echocardiogram noting worsening ejection fraction of 30 to 35%, severe mitral and tricuspid regurg, severe pulmonary hypertension and indeterminate diastolic function  Antimicrobials: None  Code Status: Full code   Subjective: Patient denies chest pain, shortness of breath, headache, vision change.  She states that she is "sore" in lower back and hips and is requesting medication for this.  Objective: Vital signs were reviewed and unremarkable. Vitals:   08/26/22 2354 08/27/22 0840  BP: 131/79 134/84  Pulse: 66 67  Resp: 19 16  Temp: 97.7 F (36.5 C) (!) 97.5 F (36.4 C)  SpO2: 100% 100%    Intake/Output Summary (Last 24 hours) at 08/27/2022 1217 Last data filed at 08/27/2022 1016 Gross per 24 hour  Intake 360 ml  Output 2600 ml  Net -2240 ml  Net IO Since Admission: -5,797.37 mL [08/27/22 1217]  Filed Weights   08/23/22 0415 08/24/22 0500 08/25/22 0610  Weight: 64 kg 63.7 kg 63.7 kg   Body mass index is 24.88 kg/m.  Exam: General: Alert and oriented x3, no acute distress HEENT: Normocephalic, atraumatic Cardiovascular: Regular rate and rhythm, S1-S2, 2 out of 6 systolic ejection murmur Respiratory: Decreased breath sounds all lung fields, no wheezes/rhonchi Abdomen: Soft, nontender, mild distention, hypoactive bowel sounds Musculoskeletal: No clubbing or cyanosis, trace pitting edema Skin: No skin breaks, tears or lesions on limited skin exam Psychiatry: Appropriate, no evidence of psychoses, normal thought content Neurology: No focal deficits  Data  Reviewed: Continued improvement in renal function and transaminases  Disposition:  Status is: Inpatient Remains inpatient appropriate because:  -Further diuresis    Anticipated discharge date: 9/21   Family Communication: Offered call to family/support persons, patient declines at this time DVT Prophylaxis: enoxaparin (LOVENOX) injection 40 mg Start: 08/27/22 2200      Author: Emeterio Reeve DO 08/27/2022 12:17 PM  To reach On-call, see care teams to locate the attending and reach out via [email or web address]. Between 7PM-7AM, please contact night-coverage If you still have difficulty reaching the attending provider, please page the [facility] (Director on Call) for [facility] on amion for assistance.

## 2022-08-27 NOTE — Evaluation (Signed)
Occupational Therapy Evaluation Patient Details Name: Anita [name] MRN: [id number] DOB: [birth year] Today's Date: 08/27/2022   History of Present Illness Pt is 61 year old female with past medical history of multiple sclerosis and systolic/diastolic heart failure with moderate to severe tricuspid regurg and severe mitral regurg, HTN, COPD, presented to the emergency room on 9/14 with complaints of increased facial and abdominal swelling for the past few weeks.  Patient is on diuretics, but has not been taking them as of late.  In the emergency room, patient found to have acute kidney injury.   Clinical Impression   Patient presenting with decreased independence in self-care, functional mobility, safety, and endurance. Pt in bed upon arrival and agreeable to OT services. Pt reports she lives at home with her daughter and that she bring her food and medication. She also reports she has a tub shower, BSC, RW, rollator, and SPC. At baseline pt sponge bathes and is ambulatory with rollator. Pt reports before ambulating in the home she plans out the day and moves very slowly. Therapist attempted to provide education on energy conservation in the home by bringing [facility] to side of bed for safety, but pt declined. Pt decline any functional mobility task stating "I'm not standing anymore today." Patient currently functioning at supervision for bed mobility (long sitting with use of bed rails.) Pt was able to perform grooming tasks in long sitting, with set up in, in bed unsupported (brushing teeth and combing hair). Patient was also able to donn socks while sitting in bed with supervision.  Patient will benefit from acute OT to increase overall independence in the areas of ADLs, functional mobility, in order to safely discharge to the next venue of care.       Recommendations for follow up therapy are one component of a multi-disciplinary discharge planning process, led by the attending physician.   Recommendations may be updated based on patient status, additional functional criteria and insurance authorization.   Follow Up Recommendations  Skilled nursing-short term rehab (<3 hours/day)    Assistance Recommended at Discharge Intermittent Supervision/Assistance  Patient can return home with the following A lot of help with walking and/or transfers;A lot of help with bathing/dressing/bathroom;Assistance with cooking/housework    Functional Status Assessment  Patient has had a recent decline in their functional status and/or demonstrates limited ability to make significant improvements in function in a reasonable and predictable amount of time  Equipment Recommendations  Other (comment) (Defer to next venue of care.)    Recommendations for Other Services       Precautions / Restrictions Precautions Precautions: Fall Restrictions Weight Bearing Restrictions: No      Mobility Bed Mobility Overal bed mobility: Needs Assistance             General bed mobility comments: long sitting with use of rail    Transfers                   General transfer comment: Pt decline any functional mobility tasks.      Balance Overall balance assessment: Needs assistance Sitting-balance support: Bilateral upper extremity supported, Feet unsupported Sitting balance-Leahy Scale: Fair Sitting balance - Comments: Pt sitting in long sitting.       Standing balance comment: patient declined. "I'm not standing anymore"                           ADL either performed or assessed with clinical judgement   ADL Overall  ADL's : Needs assistance/impaired     Grooming: Oral care;Brushing hair;Set up;Supervision/safety;Sitting;Bed level               Lower Body Dressing: Supervision/safety;Bed level                       Vision Baseline Vision/History: 1 Wears glasses       Perception     Praxis      Pertinent Vitals/Pain Pain Assessment Pain  Assessment: Faces Faces Pain Scale: Hurts a little bit Pain Location: generalized, some abdominal pain Pain Descriptors / Indicators: Grimacing, Discomfort, Burning     Hand Dominance     Extremity/Trunk Assessment Upper Extremity Assessment Upper Extremity Assessment: Generalized weakness   Lower Extremity Assessment Lower Extremity Assessment: Generalized weakness   Cervical / Trunk Assessment Cervical / Trunk Assessment: Normal   Communication Communication Communication: No difficulties   Cognition Arousal/Alertness: Awake/alert Behavior During Therapy: WFL for tasks assessed/performed Overall Cognitive Status: Within Functional Limits for tasks assessed                                 General Comments: self limiting                Home Living Family/patient expects to be discharged to:: Private residence Living Arrangements: Children (Daughter) Available Help at Discharge: Family;Available 24 hours/day Type of Home: House Home Access: Stairs to enter [employer] of Steps: 1 Entrance Stairs-Rails: None Home Layout: One level     Bathroom Shower/Tub: Teacher, early years/pre: Standard     Home Equipment: Public relations account executive (2 wheels);Rollator (4 wheels);Shower seat;Cane - single point;Wheelchair - manual          Prior Functioning/Environment Prior Level of Function : Independent/Modified Independent             Mobility Comments: ambulatory with rollator, denies falls ADLs Comments: reported modI for ADLs, difficulty navigating tub, as well as broken shower head        OT Problem List: Decreased strength;Decreased activity tolerance;Impaired balance (sitting and/or standing);Decreased safety awareness;Pain      OT Treatment/Interventions:      OT Goals(Current goals can be found in the care plan section) Acute Rehab OT Goals Patient Stated Goal: to return home. OT Goal Formulation: With patient Time  For Goal Achievement: 09/10/22 Potential to Achieve Goals: Fair ADL Goals Pt Will Perform Grooming: with min assist Pt Will Perform Lower Body Bathing: with min assist Pt Will Perform Lower Body Dressing: with supervision Pt Will Transfer to Toilet: with min assist Pt Will Perform Toileting - Clothing Manipulation and hygiene: with supervision  OT Frequency: Min 2X/week       AM-PAC OT "6 Clicks" Daily Activity     Outcome Measure Help from another person eating meals?: None Help from another person taking care of personal grooming?: A Little Help from another person toileting, which includes using toliet, bedpan, or urinal?: A Little Help from another person bathing (including washing, rinsing, drying)?: A Little Help from another person to put on and taking off regular upper body clothing?: None Help from another person to put on and taking off regular lower body clothing?: A Little 6 Click Score: 20   End of Session Nurse Communication: Mobility status  Activity Tolerance: Patient limited by fatigue Patient left: in bed;with bed alarm set;with call bell/phone within reach  OT Visit Diagnosis: Muscle weakness (generalized) (  M62.81);Unsteadiness on feet (R26.81)                Time: 1421-1440 OT Time Calculation (min): 19 min Charges:       Tomasa Blase, OTS 08/27/2022, 3:43 PM

## 2022-08-27 NOTE — Evaluation (Signed)
Physical Therapy Evaluation Patient Details Name: CAYTLIN BETTER MRN: [id number] DOB: December 15, 1960 Today's Date: 08/27/2022  History of Present Illness  Pt is 61 year old female with past medical history of multiple sclerosis and systolic/diastolic heart failure with moderate to severe tricuspid regurg and severe mitral regurg, HTN, COPD, presented to the emergency room on 9/14 with complaints of increased facial and abdominal swelling for the past few weeks.  Patient is on diuretics, but has not been taking them as of late.  In the emergency room, patient found to have acute kidney injury.   Clinical Impression  Patient alert, agreeable to mobility with max encouragement. Reported at baseline she is ambulatory with rollator, lives with her daughter.   She performed supine <> sit with minA, extra time and bed rails. Pt insistent on assistance to transition to EOB. Fair sitting balance. Sit <> stand with max encouragement, minA with RW (PT stabilizing RW). Very wide BOS and pt unwilling to take any steps at this time.  Overall the patient demonstrated deficits (see "PT Problem List") that impede the patient's functional abilities, safety, and mobility and would benefit from skilled PT intervention. Recommendation is SNF due to decline from PLOF and current level of assistance needed.      Recommendations for follow up therapy are one component of a multi-disciplinary discharge planning process, led by the attending physician.  Recommendations may be updated based on patient status, additional functional criteria and insurance authorization.  Follow Up Recommendations Skilled nursing-short term rehab (<3 hours/day) Can patient physically be transported by private vehicle: No    Assistance Recommended at Discharge Frequent or constant Supervision/Assistance  Patient can return home with the following  A lot of help with walking and/or transfers;A lot of help with  bathing/dressing/bathroom;Assistance with feeding;Assistance with cooking/housework;Help with stairs or ramp for entrance    Equipment Recommendations Other (comment) (TBD)  Recommendations for Other Services       Functional Status Assessment Patient has had a recent decline in their functional status and demonstrates the ability to make significant improvements in function in a reasonable and predictable amount of time.     Precautions / Restrictions Precautions Precautions: Fall Restrictions Weight Bearing Restrictions: No      Mobility  Bed Mobility Overal bed mobility: Needs Assistance Bed Mobility: Supine to Sit, Sit to Supine     Supine to sit: Min assist, HOB elevated Sit to supine: Min assist        Transfers Overall transfer level: Needs assistance Equipment used: Rolling walker (2 wheels) Transfers: Sit to/from Stand Sit to Stand: Min assist                Ambulation/Gait               General Gait Details: pt refused ambulation at this time due to fatigue/weakness  Stairs            Wheelchair Mobility    Modified Rankin (Stroke Patients Only)       Balance Overall balance assessment: Needs assistance Sitting-balance support: Feet supported, Bilateral upper extremity supported Sitting balance-Leahy Scale: Fair     Standing balance support: Reliant on assistive device for balance Standing balance-Leahy Scale: Poor Standing balance comment: very wide BOS                             Pertinent Vitals/Pain Pain Assessment Pain Assessment: Faces Faces Pain Scale: Hurts a little bit Pain Location: generalized,  some abdominal pain Pain Descriptors / Indicators: Grimacing, Moaning Pain Intervention(s): Limited activity within patient's tolerance, Monitored during session, Repositioned    Home Living Family/patient expects to be discharged to:: Private residence Living Arrangements: Children Available Help at  Discharge: Family;Available 24 hours/day (daughter works from home) Type of Home: House Home Access: Stairs to enter Entrance Stairs-Rails: None Technical brewer of Steps: 1   Home Layout: One level Home Equipment: Public relations account executive (2 wheels);Rollator (4 wheels);Shower seat;Cane - single point;Wheelchair - manual      Prior Function Prior Level of Function : Independent/Modified Independent             Mobility Comments: ambulatory with rollator, denies falls ADLs Comments: reported modI for ADLs, difficulty navigating tub, as well as broken shower head     Hand Dominance        Extremity/Trunk Assessment   Upper Extremity Assessment Upper Extremity Assessment: Generalized weakness    Lower Extremity Assessment Lower Extremity Assessment: Generalized weakness    Cervical / Trunk Assessment Cervical / Trunk Assessment: Normal  Communication   Communication: No difficulties  Cognition Arousal/Alertness: Awake/alert Behavior During Therapy: WFL for tasks assessed/performed Overall Cognitive Status: Within Functional Limits for tasks assessed                                 General Comments: self limiting        General Comments      Exercises     Assessment/Plan    PT Assessment Patient needs continued PT services  PT Problem List Decreased strength;Decreased mobility;Decreased activity tolerance;Decreased balance       PT Treatment Interventions DME instruction;Therapeutic exercise;Gait training;Balance training;Stair training;Neuromuscular re-education;Functional mobility training;Therapeutic activities;Patient/family education    PT Goals (Current goals can be found in the Care Plan section)  Acute Rehab PT Goals Patient Stated Goal: to go home PT Goal Formulation: With patient Time For Goal Achievement: 09/10/22 Potential to Achieve Goals: Good    Frequency Min 2X/week     Co-evaluation               AM-PAC  PT "6 Clicks" Mobility  Outcome Measure Help needed turning from your back to your side while in a flat bed without using bedrails?: A Little Help needed moving from lying on your back to sitting on the side of a flat bed without using bedrails?: A Little Help needed moving to and from a bed to a chair (including a wheelchair)?: A Little Help needed standing up from a chair using your arms (e.g., wheelchair or bedside chair)?: A Little Help needed to walk in hospital room?: A Lot Help needed climbing 3-5 steps with a railing? : Total 6 Click Score: 15    End of Session Equipment Utilized During Treatment: Gait belt Activity Tolerance: Patient tolerated treatment well;Other (comment) (self limiting) Patient left: with call bell/phone within reach;in bed;with bed alarm set Nurse Communication: Mobility status PT Visit Diagnosis: Other abnormalities of gait and mobility (R26.89);Difficulty in walking, not elsewhere classified (R26.2);Muscle weakness (generalized) (M62.81)    Time: 4081-4481 PT Time Calculation (min) (ACUTE ONLY): 24 min   Charges:   PT Evaluation $PT Eval Low Complexity: 1 Low PT Treatments $Therapeutic Activity: 8-22 mins        Lieutenant Diego PT, DPT 12:36 PM,08/27/22

## 2022-08-27 NOTE — Assessment & Plan Note (Signed)
Likely d/t lasix, will replete and continue to monitor BMP

## 2022-08-27 NOTE — Progress Notes (Signed)
Spoke with the patient and she states that she is NOT going to go to [facility] but is going to go Home She lives with her daughter She has had home health in the past and can not think of the name of the company, she requested that I call her daughter to talk about It, I called [name] at [phone] and was not able to reach her, I did request a call back The daughter called back , her daughter is sick at home with a fever and unable to come to the hospital She is followed by palliative care at home The daughter stated that she needs to go to rehab,  I explained that due to the patient being alert and oriented I have to do what the patient Request  Her daughter agrees She tells me that the home health agency is [facility], She really like the PT that the patient The daughter will be transporting the patient home when the time comes I called [facility] to arrange [facility]

## 2022-08-27 NOTE — Plan of Care (Signed)

## 2022-08-27 NOTE — Progress Notes (Signed)
Rounding Note    Patient Name: Anita [name] Date of Encounter: 08/27/2022  [place] Cardiologist: Buford Dresser, MD   Subjective   Patient seen on rounds.  Denies any chest pain, worsening shortness of breath, but continues to endorse abdominal tenderness.  Furosemide drip has been stopped and patient has been placed back on oral torsemide with potassium supplementation.  She was -1.8 L  output.  Inpatient Medications    Scheduled Meds:  enoxaparin (LOVENOX) injection  40 mg Subcutaneous Q24H   feeding supplement  237 mL Oral BID BM   ferrous sulfate  325 mg Oral BID WC   fluticasone furoate-vilanterol  1 puff Inhalation Daily   metoprolol succinate  25 mg Oral Daily   pantoprazole  40 mg Oral BID   potassium chloride  40 mEq Oral BID   sodium chloride flush  3 mL Intravenous Q12H   sodium chloride flush  3 mL Intravenous Q12H   torsemide  40 mg Oral Daily   Continuous Infusions:  sodium chloride     PRN Meds: sodium chloride, acetaminophen **OR** acetaminophen, albuterol, ondansetron (ZOFRAN) IV, oxyCODONE, sodium chloride flush, traMADol   Vital Signs    Vitals:   08/26/22 0908 08/26/22 1736 08/26/22 2354 08/27/22 0840  BP: 112/62 115/85 131/79 134/84  Pulse: 63 65 66 67  Resp: '16 16 19 16  '$ Temp: 97.7 F (36.5 C)  97.7 F (36.5 C) (!) 97.5 F (36.4 C)  TempSrc:      SpO2: 96% 100% 100% 100%  Weight:      Height:        Intake/Output Summary (Last 24 hours) at 08/27/2022 1426 Last data filed at 08/27/2022 1016 Gross per 24 hour  Intake 360 ml  Output 2350 ml  Net -1990 ml      08/25/2022    6:10 AM 08/24/2022    5:00 AM 08/23/2022    4:15 AM  Last 3 Weights  Weight (lbs) 140 lb 6.9 oz 140 lb 6.9 oz 141 lb 1.5 oz  Weight (kg) 63.7 kg 63.7 kg 64 kg      Telemetry    Sinus with a rate in the 60s with 2 noted episodes of SVT with the longest run being 18 beats- Personally Reviewed  ECG    No new tracings- Personally  Reviewed  Physical Exam   GEN: No acute distress.  Lying in bed watching TV Neck:  JVD is noted Cardiac: RRR, II-III systolic murmur, without rubs, or gallops.  Respiratory: Diminished to auscultation bilaterally.  Rations remain unlabored at rest on 2 L O2 via nasal cannula GI: Remains tender to mild palpation, distended  MS: No edema; No deformity. Neuro:  Nonfocal  Psych: Normal affect   Labs    High Sensitivity Troponin:   Recent Labs  Lab 08/21/22 1705 08/21/22 1840 08/23/22 0534  TROPONINIHS 601* 521* 122*     Chemistry Recent Labs  Lab 08/24/22 0508 08/25/22 0626 08/26/22 0427 08/27/22 0641  NA 132* 130* 135 134*  K 4.2 4.5 3.2* 3.1*  CL 99 96* 96* 96*  CO2 '22 23 28 28  '$ GLUCOSE 94 100* 89 80  BUN 65* 60* 56* 46*  CREATININE 2.25* 2.24* 2.05* 1.76*  CALCIUM 8.7* 8.3* 8.5* 8.4*  PROT 7.5 7.0 7.3  --   ALBUMIN 3.6 3.4* 3.5  --   AST 214* 120* 76*  --   ALT 401* 253* 197*  --   ALKPHOS 117 112 113  --  BILITOT 1.8* 2.1* 1.7*  --   GFRNONAA 25* 25* 27* 33*  ANIONGAP '11 11 11 10    '$ Lipids No results for input(s): "CHOL", "TRIG", "HDL", "LABVLDL", "LDLCALC", "CHOLHDL" in the last 168 hours.  Hematology Recent Labs  Lab 08/21/22 1705 08/22/22 0412  WBC 9.7 9.1  RBC 3.97 3.85*  HGB 10.6* 10.6*  HCT 36.0 34.5*  MCV 90.7 89.6  MCH 26.7 27.5  MCHC 29.4* 30.7  RDW 17.8* 17.9*  PLT 227 192   Thyroid No results for input(s): "TSH", "FREET4" in the last 168 hours.  BNP Recent Labs  Lab 08/21/22 1705  BNP >4,500.0*    DDimer No results for input(s): "DDIMER" in the last 168 hours.   Radiology    No results found.  Cardiac Studies  TTE 08/22/22 1. Left ventricular ejection fraction, by estimation, is 30 to 35%. The  left ventricle has moderately decreased function. The left ventricle  demonstrates global hypokinesis. There is mild left ventricular  hypertrophy. Left ventricular diastolic  parameters are indeterminate.   2. Right ventricular  systolic function is severely reduced. The right  ventricular size is moderately enlarged. Tricuspid regurgitation signal is  inadequate for assessing PA pressure.   3. Right atrial size was moderately dilated.   4. The mitral valve is normal in structure. Severe mitral valve  regurgitation. No evidence of mitral stenosis.   5. Tricuspid valve regurgitation is severe.   6. The aortic valve is normal in structure. Aortic valve regurgitation is  mild. Aortic valve sclerosis/calcification is present, without any  evidence of aortic stenosis.   7. The inferior vena cava is normal in size with greater than 50%  respiratory variability, suggesting right atrial pressure of 3 mmHg.   Patient Profile     61 y.o. female with history of multiple sclerosis, CAD, severe MR with moderate MS, COPD, prior tobacco use, who has been seen and evaluated for congestive heart failure.  Assessment & Plan    Ascites -CTA of abdomen/pelvis showed large volume ascites, small bilateral pleural effusion, left retroperitoneal mass -Recs per nephrology -Recommend palliative care consult to determine goals of treatment and care -Continued on oxygen with improved breathing reported  Ischemic cardiomyopathy/acute on chronic systolic and diastolic heart failure -LVEF 30-35% -BNP greater than 4500 -Diuresis being managed per nephrology - -1.9 L in the last 24 hours -Unable to escalate GDMT using ACE/ARB/ARNI/aldosterone antagonist due to kidney function -If limited improvement can consider right heart catheterization to objectively assess hemodynamics -Daily weight, I&O, low-sodium diet  Elevated high-sensitivity troponin/coronary artery disease with CTO RCA -Currently chest pain-free -High-sensitivity troponins peaked at 601 -Echocardiogram revealing worsening LV function -Poor candidate for left heart catheterization due to AKI on CKD -No ischemic changes noted on telemetry  Severe MR and moderate  MS -Positive for heart murmur on exam -She has previously felt not a candidate for MitraClip -She is also refused further work-up previously -LVEF 30-35%, severe MR, severe TR, right ventricular function severely reduced on most recent echocardiogram -  AKI on CKD -Serum creatinine 1.76 -Improved from yesterday at 2.05 -Creatinine 1.13 -Diuresis per nephrology -Monitor urine output -Daily BMP -Nephrotoxic agents where able -Furosemide drip discontinued today restarted on torsemide by nephrology  Hypokalemia -Potassium 3.1 -Started on potassium 40 mEq twice daily -Recommend keeping potassium level closer to 4 -Daily BMP -Monitor/trend/replete electrolytes as needed     For questions or updates, please contact [place] Please consult [email or web address] for contact info under  Signed, Addi Pak, NP  08/27/2022, 2:26 PM

## 2022-08-28 DIAGNOSIS — G35 Multiple sclerosis: Secondary | ICD-10-CM | POA: Diagnosis not present

## 2022-08-28 DIAGNOSIS — I130 Hypertensive heart and chronic kidney disease with heart failure and stage 1 through stage 4 chronic kidney disease, or unspecified chronic kidney disease: Secondary | ICD-10-CM

## 2022-08-28 DIAGNOSIS — R601 Generalized edema: Secondary | ICD-10-CM | POA: Diagnosis not present

## 2022-08-28 DIAGNOSIS — R778 Other specified abnormalities of plasma proteins: Secondary | ICD-10-CM | POA: Diagnosis not present

## 2022-08-28 DIAGNOSIS — E8770 Fluid overload, unspecified: Secondary | ICD-10-CM

## 2022-08-28 DIAGNOSIS — I1310 Hypertensive heart and chronic kidney disease without heart failure, with stage 1 through stage 4 chronic kidney disease, or unspecified chronic kidney disease: Secondary | ICD-10-CM

## 2022-08-28 DIAGNOSIS — I5023 Acute on chronic systolic (congestive) heart failure: Secondary | ICD-10-CM | POA: Diagnosis not present

## 2022-08-28 DIAGNOSIS — N179 Acute kidney failure, unspecified: Secondary | ICD-10-CM | POA: Diagnosis not present

## 2022-08-28 DIAGNOSIS — I5043 Acute on chronic combined systolic (congestive) and diastolic (congestive) heart failure: Secondary | ICD-10-CM | POA: Diagnosis not present

## 2022-08-28 LAB — COMPREHENSIVE METABOLIC PANEL
ALT: 121 U/L — ABNORMAL HIGH (ref 0–44)
AST: 44 U/L — ABNORMAL HIGH (ref 15–41)
Albumin: 3.4 g/dL — ABNORMAL LOW (ref 3.5–5.0)
Alkaline Phosphatase: 100 U/L (ref 38–126)
Anion gap: 11 (ref 5–15)
BUN: 41 mg/dL — ABNORMAL HIGH (ref 6–20)
CO2: 30 mmol/L (ref 22–32)
Calcium: 8.8 mg/dL — ABNORMAL LOW (ref 8.9–10.3)
Chloride: 96 mmol/L — ABNORMAL LOW (ref 98–111)
Creatinine, Ser: 1.62 mg/dL — ABNORMAL HIGH (ref 0.44–1.00)
GFR, Estimated: 36 mL/min — ABNORMAL LOW (ref >60)
Glucose, Bld: 90 mg/dL (ref 70–99)
Potassium: 3.6 mmol/L (ref 3.5–5.1)
Sodium: 137 mmol/L (ref 135–145)
Total Bilirubin: 2.1 mg/dL — ABNORMAL HIGH (ref 0.3–1.2)
Total Protein: 7 g/dL (ref 6.5–8.1)

## 2022-08-28 LAB — CBC
HCT: 37.5 % (ref 36.0–46.0)
Hemoglobin: 11.3 g/dL — ABNORMAL LOW (ref 12.0–15.0)
MCH: 27 pg (ref 26.0–34.0)
MCHC: 30.1 g/dL (ref 30.0–36.0)
MCV: 89.7 fL (ref 80.0–100.0)
Platelets: 177 10*3/uL (ref 150–400)
RBC: 4.18 MIL/uL (ref 3.87–5.11)
RDW: 19.5 % — ABNORMAL HIGH (ref 11.5–15.5)
WBC: 7.4 10*3/uL (ref 4.0–10.5)
nRBC: 0 % (ref 0.0–0.2)

## 2022-08-28 NOTE — Progress Notes (Signed)
[facility] Progress Note  Patient: Anita [name]    WPY:[id number]  DOA: 08/21/2022    Date of Service: the patient was seen and examined on 08/28/2022  Brief hospital course: 61 year old female with past medical history of multiple sclerosis and systolic/diastolic heart failure with moderate to severe tricuspid regurg and severe mitral regurg presented to the emergency room on 9/14 with complaints of increased facial and abdominal swelling for the past few weeks.  Patient is on diuretics, but has not been taking them as of late.  In the emergency room, patient found to have acute kidney injury with a creatinine of 2.73, BNP of greater than 4500, troponin of 600 and she was given a dose of Lasix in the emergency room.  Cardiology and nephrology consulted.  Following suboptimal response to doses of IV Lasix, patient changed over to Lasix drip to treat volume overload driven by biventricular failure. Per cardiology, unable to add ACE/ARB/ARNI/aldosterone antagonist d/t renal function, and she is not a candidate for repair of valvular disease - cardiac treatment options limited. Palliative care following.   By 9/20, nephrology had converted patient from Lasix drip over to oral Demadex.  Patient continues to have good urine output and improvement in renal function.  Assessment and Plan: Acute on chronic combined systolic and diastolic CHF (congestive heart failure) [facility]) Cardiology following.  Echocardiogram done 9/15 notes ejection fraction of 30 to 35% with severe tricuspid and mitral valve regurg.  Global hypokinesis.  Indeterminate diastolic function.  This looks to be worse than previous echocardiogram done 15 months ago.  After minimal diuresis, pt was placed on Lasix drip on 9/17 as per nephrology recommendations.  By 9/20, drip discontinued and patient started on oral Demadex.  To date, patient has diuresed over 7 L and is more than -6.5 L deficient.  Continue oral diuretics although,  should diuresis become suboptimal, will switch back to drip.   Please note that as per cardiology's evaluations, these are limited short-term interventions, as long-term, patient has limited options as she is not a can for valve repair/replacement and ARB/ACE inhibitor not felt to be indicated given her renal function but may be able to add this if renal function improves / BP allows.   Anasarca Secondary to CHF.  Right ventricular failure causing transaminitis and overall hepatic congestion.  As above.  Improving.  Acute kidney injury superimposed on stage IIIa chronic kidney disease ([place]) Secondary to decompensated heart failure.  Baseline creatinine around 1.13.  Nephrology following.  No indication for dialysis.  Creatinine slowly improving, down to 1.6 today.  COPD (chronic obstructive pulmonary disease) (HCC) Stable, denies any shortness of breath  Essential hypertension Elevated diastolic blood pressures in part due to right ventricular failure.  Numbers improved with diuresis. Consider ACE/ARB/ARNI/aldosterone antagonist per renal function/nephrology and per cardiology  Transaminitis Secondary to hepatic congestion from heart failure.  Continues to improve as she diureses. Can follow outpatient.   Elevated troponin Demand ischemia versus elevations due to renal failure and heart failure.  Cardiology following.  Cycle as needed.  Patient denies any chest pain at this time.  Multiple sclerosis ([place]) Stable at this time.  Not on any medications.  Coronary artery disease involving native coronary artery of native heart without angina pectoris Cardiology following. At this point no apparent plans for ischemic workup.   IDA (iron deficiency anemia) Monitor periodic CBC, follow outpatient   Tobacco abuse Have provided counseling. Nicotine patch.   Hypokalemia Likely d/t lasix, will replete and continue  to monitor BMP       Body mass index is 22.77 kg/m.         Consultants: Cardiology Nephrology Palliative care  Procedures: Echocardiogram noting worsening ejection fraction of 30 to 35%, severe mitral and tricuspid regurg, severe pulmonary hypertension and indeterminate diastolic function  Antimicrobials: None  Code Status: Full code   Subjective: Patient states that she still feels weak, but trying to diurese as best that she can  Objective: Vital signs were reviewed and unremarkable. Vitals:   08/28/22 0425 08/28/22 0751  BP: 127/76 130/73  Pulse: 66 65  Resp: 18 16  Temp: 97.8 F (36.6 C) 98.1 F (36.7 C)  SpO2: 100% 100%    Intake/Output Summary (Last 24 hours) at 08/28/2022 1354 Last data filed at 08/28/2022 0428 Gross per 24 hour  Intake 120 ml  Output 1400 ml  Net -1280 ml    Filed Weights   08/24/22 0500 08/25/22 0610 08/28/22 0426  Weight: 63.7 kg 63.7 kg 58.3 kg   Body mass index is 22.77 kg/m.  Exam:  General: Alert and oriented x3, no acute distress HEENT: Normocephalic, atraumatic, mucous membranes slightly dry Cardiovascular: Regular rate and rhythm, S1-S2, 2 out of 6 systolic ejection murmur Respiratory: Decreased breath sounds throughout Abdomen: Soft, nontender, distention continues to improve, hypoactive bowel sounds Musculoskeletal: No clubbing or cyanosis, trace pitting edema Skin: No skin breaks, tears or lesions Psychiatry: Appropriate, no evidence of psychoses Neurology: No focal deficits  Data Reviewed: Continued improvement in renal function and transaminases  Disposition:  Status is: Inpatient Remains inpatient appropriate because:  -Further diuresis    Anticipated discharge date: 9/23   Family Communication: Left message for daughter DVT Prophylaxis: enoxaparin (LOVENOX) injection 40 mg Start: 08/27/22 2200  Lovenox    Author: Annita Brod ,MD 08/28/2022 1:54 PM  To reach On-call, see care teams to locate the attending and reach out via [email or web address]. Between  7PM-7AM, please contact night-coverage If you still have difficulty reaching the attending provider, please page the [facility] (Director on Call) for [facility] on amion for assistance.

## 2022-08-28 NOTE — Progress Notes (Signed)
PT Cancellation Note  Patient Details Name: Anita [name] MRN: [id number] DOB: Oct 12, 1961   Cancelled Treatment:    Reason Eval/Treat Not Completed: Other (comment). Pt with OT at bedside, PT to re-attempt as able.    Lieutenant Diego PT, DPT 3:00 PM,08/28/22

## 2022-08-28 NOTE — Plan of Care (Signed)
Pt is non compliant with mobility and is refusing care Problem: Education: Goal: Knowledge of General Education information will improve Description: Including pain rating scale, medication(s)/side effects and non-pharmacologic comfort measures Outcome: Not Progressing   Problem: Health Behavior/Discharge Planning: Goal: Ability to manage health-related needs will improve Outcome: Not Progressing   Problem: Clinical Measurements: Goal: Ability to maintain clinical measurements within normal limits will improve Outcome: Not Progressing Goal: Will remain free from infection Outcome: Not Progressing Goal: Diagnostic test results will improve Outcome: Not Progressing Goal: Respiratory complications will improve Outcome: Not Progressing Goal: Cardiovascular complication will be avoided Outcome: Not Progressing   Problem: Activity: Goal: Risk for activity intolerance will decrease Outcome: Not Progressing   Problem: Nutrition: Goal: Adequate nutrition will be maintained Outcome: Not Progressing   Problem: Coping: Goal: Level of anxiety will decrease Outcome: Not Progressing   Problem: Elimination: Goal: Will not experience complications related to bowel motility Outcome: Not Progressing Goal: Will not experience complications related to urinary retention Outcome: Not Progressing   Problem: Pain Managment: Goal: General experience of comfort will improve Outcome: Not Progressing   Problem: Safety: Goal: Ability to remain free from injury will improve Outcome: Not Progressing   Problem: Skin Integrity: Goal: Risk for impaired skin integrity will decrease Outcome: Not Progressing   Problem: Education: Goal: Knowledge of General Education information will improve Description: Including pain rating scale, medication(s)/side effects and non-pharmacologic comfort measures Outcome: Not Progressing   Problem: Health Behavior/Discharge Planning: Goal: Ability to manage  health-related needs will improve Outcome: Not Progressing   Problem: Clinical Measurements: Goal: Ability to maintain clinical measurements within normal limits will improve Outcome: Not Progressing Goal: Will remain free from infection Outcome: Not Progressing Goal: Diagnostic test results will improve Outcome: Not Progressing Goal: Respiratory complications will improve Outcome: Not Progressing Goal: Cardiovascular complication will be avoided Outcome: Not Progressing   Problem: Activity: Goal: Risk for activity intolerance will decrease Outcome: Not Progressing   Problem: Nutrition: Goal: Adequate nutrition will be maintained Outcome: Not Progressing   Problem: Coping: Goal: Level of anxiety will decrease Outcome: Not Progressing   Problem: Elimination: Goal: Will not experience complications related to bowel motility Outcome: Not Progressing Goal: Will not experience complications related to urinary retention Outcome: Not Progressing   Problem: Pain Managment: Goal: General experience of comfort will improve Outcome: Not Progressing   Problem: Safety: Goal: Ability to remain free from injury will improve Outcome: Not Progressing   Problem: Skin Integrity: Goal: Risk for impaired skin integrity will decrease Outcome: Not Progressing

## 2022-08-28 NOTE — Care Management Important Message (Signed)
Important Message  Patient Details  Name: Anita [name] MRN: [id number] Date of Birth: June 05, 1961   Medicare Important Message Given:  Yes     [name] 08/28/2022, 2:26 PM

## 2022-08-28 NOTE — Progress Notes (Signed)
[facility]  ROUNDING NOTE   Subjective:   Patient seen sitting up in bed Tolerating small meals, denies nausea and vomiting.  Abdominal distention remains  Creatinine 1.62 Urine output 2.3L   Objective:  Vital signs in last 24 hours:  Temp:  [97.3 F (36.3 C)-98.3 F (36.8 C)] 98.1 F (36.7 C) (09/21 0751) Pulse Rate:  [65-68] 65 (09/21 0751) Resp:  [16-18] 16 (09/21 0751) BP: (119-130)/(73-84) 130/73 (09/21 0751) SpO2:  [100 %] 100 % (09/21 0751) Weight:  [58.3 kg] 58.3 kg (09/21 0426)  Weight change:  Filed Weights   08/24/22 0500 08/25/22 0610 08/28/22 0426  Weight: 63.7 kg 63.7 kg 58.3 kg    Intake/Output: I/O last 3 completed shifts: In: 240 [P.O.:240] Out: 3500 [Urine:3500]   Intake/Output this shift:  No intake/output data recorded.  Physical Exam: General: NAD  Head: Normocephalic, atraumatic. Moist oral mucosal membranes  Eyes: Anicteric  Lungs:  Diminished in bases, normal effort  Heart: Regular rate and rhythm, murmur  Abdomen:  Soft, tender, moderate distention  Extremities: No peripheral edema.  Neurologic: Nonfocal, moving all four extremities  Skin: No lesions  Access: None    Basic Metabolic Panel: Recent Labs  Lab 08/24/22 0508 08/25/22 0626 08/26/22 0427 08/27/22 0641 08/28/22 0423  NA 132* 130* 135 134* 137  K 4.2 4.5 3.2* 3.1* 3.6  CL 99 96* 96* 96* 96*  CO2 '22 23 28 28 30  '$ GLUCOSE 94 100* 89 80 90  BUN 65* 60* 56* 46* 41*  CREATININE 2.25* 2.24* 2.05* 1.76* 1.62*  CALCIUM 8.7* 8.3* 8.5* 8.4* 8.8*     Liver Function Tests: Recent Labs  Lab 08/23/22 0534 08/24/22 0508 08/25/22 0626 08/26/22 0427 08/28/22 0423  AST 326* 214* 120* 76* 44*  ALT 482* 401* 253* 197* 121*  ALKPHOS 116 117 112 113 100  BILITOT 1.7* 1.8* 2.1* 1.7* 2.1*  PROT 7.3 7.5 7.0 7.3 7.0  ALBUMIN 3.5 3.6 3.4* 3.5 3.4*    No results for input(s): "LIPASE", "AMYLASE" in the last 168 hours. No results for input(s): "AMMONIA" in the  last 168 hours.  CBC: Recent Labs  Lab 08/21/22 1705 08/22/22 0412 08/28/22 0423  WBC 9.7 9.1 7.4  HGB 10.6* 10.6* 11.3*  HCT 36.0 34.5* 37.5  MCV 90.7 89.6 89.7  PLT 227 192 177     Cardiac Enzymes: No results for input(s): "CKTOTAL", "CKMB", "CKMBINDEX", "TROPONINI" in the last 168 hours.  BNP: Invalid input(s): "POCBNP"  CBG: No results for input(s): "GLUCAP" in the last 168 hours.  Microbiology: Results for orders placed or performed during the hospital encounter of 06/04/21  Urine Culture     Status: Abnormal   Collection Time: 06/04/21  2:56 PM   Specimen: Urine, Random  Result Value Ref Range Status   Specimen Description   Final    URINE, RANDOM Performed at [facility], [address], [place], [place] [address]    Special Requests   Final    NONE Performed at [facility], [address] [place], [place] [address]    Culture MULTIPLE SPECIES PRESENT, SUGGEST RECOLLECTION (A)  Final   Report Status 06/06/2021 FINAL  Final  Resp Panel by RT-PCR (Flu A&B, Covid) Nasopharyngeal Swab     Status: None   Collection Time: 06/04/21  8:23 PM   Specimen: Nasopharyngeal Swab; Nasopharyngeal(NP) swabs in vial transport medium  Result Value Ref Range Status   SARS Coronavirus 2 by RT PCR NEGATIVE NEGATIVE Final  Comment: (NOTE) SARS-CoV-2 target nucleic acids are NOT DETECTED.  The SARS-CoV-2 RNA is generally detectable in upper respiratory specimens during the acute phase of infection. The lowest concentration of SARS-CoV-2 viral copies this assay can detect is 138 copies/mL. A negative result does not preclude SARS-Cov-2 infection and should not be used as the sole basis for treatment or other patient management decisions. A negative result may occur with  improper specimen collection/handling, submission of specimen other than nasopharyngeal swab, presence of viral mutation(s) within the areas targeted by this assay, and inadequate  number of viral copies(<138 copies/mL). A negative result must be combined with clinical observations, patient history, and epidemiological information. The expected result is Negative.  Fact Sheet for Patients:  EntrepreneurPulse.com.au  Fact Sheet for Healthcare Providers:  IncredibleEmployment.be  This test is no t yet approved or cleared by the [place] FDA and  has been authorized for detection and/or diagnosis of SARS-CoV-2 by FDA under an Emergency Use Authorization (EUA). This EUA will remain  in effect (meaning this test can be used) for the duration of the COVID-19 declaration under Section 564(b)(1) of the Act, 21 U.S.C.section 360bbb-3(b)(1), unless the authorization is terminated  or revoked sooner.       Influenza A by PCR NEGATIVE NEGATIVE Final   Influenza B by PCR NEGATIVE NEGATIVE Final    Comment: (NOTE) The Xpert Xpress SARS-CoV-2/FLU/RSV plus assay is intended as an aid in the diagnosis of influenza from Nasopharyngeal swab specimens and should not be used as a sole basis for treatment. Nasal washings and aspirates are unacceptable for Xpert Xpress SARS-CoV-2/FLU/RSV testing.  Fact Sheet for Patients: EntrepreneurPulse.com.au  Fact Sheet for Healthcare Providers: IncredibleEmployment.be  This test is not yet approved or cleared by the [place] FDA and has been authorized for detection and/or diagnosis of SARS-CoV-2 by FDA under an Emergency Use Authorization (EUA). This EUA will remain in effect (meaning this test can be used) for the duration of the COVID-19 declaration under Section 564(b)(1) of the Act, 21 U.S.C. section 360bbb-3(b)(1), unless the authorization is terminated or revoked.  Performed at [facility], [address] [place], [place] [address]     Coagulation Studies: No results for input(s): "LABPROT", "INR" in the last 72  hours.  Urinalysis: No results for input(s): "COLORURINE", "LABSPEC", "PHURINE", "GLUCOSEU", "HGBUR", "BILIRUBINUR", "KETONESUR", "PROTEINUR", "UROBILINOGEN", "NITRITE", "LEUKOCYTESUR" in the last 72 hours.  Invalid input(s): "APPERANCEUR"    Imaging: No results found.   Medications:    sodium chloride      enoxaparin (LOVENOX) injection  40 mg Subcutaneous Q24H   feeding supplement  237 mL Oral BID BM   ferrous sulfate  325 mg Oral BID WC   fluticasone furoate-vilanterol  1 puff Inhalation Daily   metoprolol succinate  25 mg Oral Daily   pantoprazole  40 mg Oral BID   sodium chloride flush  3 mL Intravenous Q12H   sodium chloride flush  3 mL Intravenous Q12H   torsemide  40 mg Oral Daily   sodium chloride, acetaminophen **OR** acetaminophen, albuterol, ondansetron (ZOFRAN) IV, oxyCODONE, sodium chloride flush, traMADol  Assessment/ Plan:  [name] is a 61 y.o.  female with past medical conditions including gout, GERD, COPD, multiple sclerosis, CAD, systolic heart failure with EF 40 to 45%, who was admitted to [facility] on 08/21/2022 for Abdominal distension [R14.0] Elevated troponin [R77.8] Hypervolemia, unspecified hypervolemia type [E87.70]   Acute kidney injury on chronic kidney disease stage IIIa likely due to cardiorenal syndrome.  Baseline creatinine appears to be 1.13 with GFR 56 on 06/14/2021.  Patient currently not followed by outpatient nephrology.  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  No recent IV contrast exposure.   Creatinine improving slowly with decent urine output. Will continue to monitor renal function with transition to oral diuretics.   Lab Results  Component Value Date   CREATININE 1.62 (H) 08/28/2022   CREATININE 1.76 (H) 08/27/2022   CREATININE 2.05 (H) 08/26/2022    Intake/Output Summary (Last 24 hours) at 08/28/2022 1109 Last data filed at  08/28/2022 0428 Gross per 24 hour  Intake 120 ml  Output 1400 ml  Net -1280 ml     2.  Acute on chronic systolic heart failure.  Echo completed this admission shows EF 30 to 35% with mild LVH, severe mitral valve regurgitation and aortic valve calcification.    Cardiology agrees with transition to oral diuresis with low threshold to return to Furosemide drip.    3.  Acute metabolic acidosis.  Serum bicarb on admission 17. Resolved.   LOS: [place] 9/21/202311:09 AM

## 2022-08-28 NOTE — Care Management Important Message (Signed)
Important Message  Patient Details  Name: Anita [name] MRN: [id number] Date of Birth: 01/04/1961   Medicare Important Message Given:  Yes     Juliann Pulse A Casidy [place] 08/28/2022, 2:36 PM

## 2022-08-28 NOTE — Progress Notes (Signed)
Occupational Therapy Treatment Patient Details Name: Anita [name] MRN: [id number] DOB: Jun 25, 1961 Today's Date: 08/28/2022   History of present illness Pt is 61 year old female with past medical history of multiple sclerosis and systolic/diastolic heart failure with moderate to severe tricuspid regurg and severe mitral regurg, HTN, COPD, presented to the emergency room on 9/14 with complaints of increased facial and abdominal swelling for the past few weeks.  Patient is on diuretics, but has not been taking them as of late.  In the emergency room, patient found to have acute kidney injury.   OT comments  Pt in bed upon arrival and agreeable to OT services. Initially went in at 2:00 and patient refused mobility tasks, stating "I'm not getting up.", we were able to come up with a plan to return at 3pm to attempt transfers if she could "do it on her own terms". Nurse informed and updated on plan to return and remove purewick since pt is refusing rehab. Therapy returned at 3 and patient was able to sit EOB, but refused to get up. Patient left in bed with call bell in reach, bed alarm set, and all needs met.    Recommendations for follow up therapy are one component of a multi-disciplinary discharge planning process, led by the attending physician.  Recommendations may be updated based on patient status, additional functional criteria and insurance authorization.    Follow Up Recommendations  Skilled nursing-short term rehab (<3 hours/day)    Assistance Recommended at Discharge Frequent or constant Supervision/Assistance  Patient can return home with the following  A lot of help with walking and/or transfers;A lot of help with bathing/dressing/bathroom;Assistance with cooking/housework   Equipment Recommendations  Other (comment) (Defer to next venue of care.)    Recommendations for Other Services      Precautions / Restrictions Precautions Precautions: Fall Restrictions Weight  Bearing Restrictions: No       Mobility Bed Mobility Overal bed mobility: Needs Assistance Bed Mobility: Supine to Sit, Sit to Supine     Supine to sit: Min assist, HOB elevated Sit to supine: Supervision        Transfers Overall transfer level: Needs assistance                 General transfer comment: Pt decline any functional mobility tasks.     Balance Overall balance assessment: Needs assistance Sitting-balance support: Bilateral upper extremity supported, Feet unsupported Sitting balance-Leahy Scale: Fair Sitting balance - Comments: Pt sitting in long sitting.       Standing balance comment: patient declined. Stating  "I'm not getting up"                           ADL either performed or assessed with clinical judgement   ADL Overall ADL's : Needs assistance/impaired                                       General ADL Comments: Patient declined    Extremity/Trunk Assessment Upper Extremity Assessment Upper Extremity Assessment: Generalized weakness   Lower Extremity Assessment Lower Extremity Assessment: Generalized weakness         Cognition Arousal/Alertness: Awake/alert Behavior During Therapy: WFL for tasks assessed/performed Overall Cognitive Status: Within Functional Limits for tasks assessed  Pertinent Vitals/ Pain       Pain Assessment Pain Assessment: Faces Faces Pain Scale: Hurts a little bit Pain Location: back pain Pain Descriptors / Indicators: Grimacing, Discomfort Pain Intervention(s): Monitored during session, Limited activity within patient's tolerance   Frequency  Min 2X/week        Progress Toward Goals  OT Goals(current goals can now be found in the care plan section)  Progress towards OT goals: Progressing toward goals  Acute Rehab OT Goals Patient Stated Goal: to return home. OT Goal Formulation: With  patient Time For Goal Achievement: 09/10/22 Potential to Achieve Goals: [place] Discharge plan remains appropriate       AM-PAC OT "6 Clicks" Daily Activity     Outcome Measure   Help from another person eating meals?: None Help from another person taking care of personal grooming?: A Little Help from another person toileting, which includes using toliet, bedpan, or urinal?: A Little Help from another person bathing (including washing, rinsing, drying)?: A Little Help from another person to put on and taking off regular upper body clothing?: None Help from another person to put on and taking off regular lower body clothing?: A Little 6 Click Score: 20    End of Session    OT Visit Diagnosis: Muscle weakness (generalized) (M62.81);Unsteadiness on feet (R26.81)   Activity Tolerance Patient limited by fatigue;Patient limited by pain   Patient Left in bed;with call bell/phone within reach;with bed alarm set   Nurse Communication Mobility status        Time: 6433-2951 OT Time Calculation (min): 8 min  Charges: OT General Charges $OT Visit: 1 Visit OT Treatments $Therapeutic Activity: 8-22 mins    Lloyd Ayo, OTS 08/28/2022, 3:52 PM

## 2022-08-28 NOTE — Plan of Care (Signed)

## 2022-08-28 NOTE — Progress Notes (Signed)
Rounding Note    Patient Name: Anita [name] Date of Encounter: 08/28/2022  [place] Cardiologist: Buford Dresser, MD   Subjective   Reports slowly doing better, abdomen still distended but slow improvement Appetite slowly improving, ate couple bites of cheeseburger, some chips Improvement in LFTs, renal function consistent with cardiorenal syndrome Was transition to torsemide 40 yesterday, 2 L now So far in the bucket 1 L yet to be logged  Inpatient Medications    Scheduled Meds:  enoxaparin (LOVENOX) injection  40 mg Subcutaneous Q24H   feeding supplement  237 mL Oral BID BM   ferrous sulfate  325 mg Oral BID WC   fluticasone furoate-vilanterol  1 puff Inhalation Daily   metoprolol succinate  25 mg Oral Daily   pantoprazole  40 mg Oral BID   sodium chloride flush  3 mL Intravenous Q12H   sodium chloride flush  3 mL Intravenous Q12H   torsemide  40 mg Oral Daily   Continuous Infusions:  sodium chloride     PRN Meds: sodium chloride, acetaminophen **OR** acetaminophen, albuterol, ondansetron (ZOFRAN) IV, oxyCODONE, sodium chloride flush, traMADol   Vital Signs    Vitals:   08/27/22 2027 08/28/22 0425 08/28/22 0426 08/28/22 0751  BP: 119/84 127/76  130/73  Pulse: 66 66  65  Resp: '18 18  16  '$ Temp: 98.3 F (36.8 C) 97.8 F (36.6 C)  98.1 F (36.7 C)  TempSrc:      SpO2: 100% 100%  100%  Weight:   58.3 kg   Height:        Intake/Output Summary (Last 24 hours) at 08/28/2022 1336 Last data filed at 08/28/2022 0428 Gross per 24 hour  Intake 120 ml  Output 1400 ml  Net -1280 ml      08/28/2022    4:26 AM 08/25/2022    6:10 AM 08/24/2022    5:00 AM  Last 3 Weights  Weight (lbs) 128 lb 8.5 oz 140 lb 6.9 oz 140 lb 6.9 oz  Weight (kg) 58.3 kg 63.7 kg 63.7 kg      Telemetry    nsr - Personally Reviewed  ECG     - Personally Reviewed  Physical Exam   GEN: No acute distress.   Neck:  JVD 8+ Cardiac: RRR, no murmurs, rubs, or  gallops.  Respiratory: Clear to auscultation bilaterally. GI: Soft, nontender, +distended  MS: No edema; No deformity. Neuro:  Nonfocal  Psych: Normal affect   Labs    High Sensitivity Troponin:   Recent Labs  Lab 08/21/22 1705 08/21/22 1840 08/23/22 0534  TROPONINIHS 601* 521* 122*     Chemistry Recent Labs  Lab 08/25/22 0626 08/26/22 0427 08/27/22 0641 08/28/22 0423  NA 130* 135 134* 137  K 4.5 3.2* 3.1* 3.6  CL 96* 96* 96* 96*  CO2 '23 28 28 30  '$ GLUCOSE 100* 89 80 90  BUN 60* 56* 46* 41*  CREATININE 2.24* 2.05* 1.76* 1.62*  CALCIUM 8.3* 8.5* 8.4* 8.8*  PROT 7.0 7.3  --  7.0  ALBUMIN 3.4* 3.5  --  3.4*  AST 120* 76*  --  44*  ALT 253* 197*  --  121*  ALKPHOS 112 113  --  100  BILITOT 2.1* 1.7*  --  2.1*  GFRNONAA 25* 27* 33* 36*  ANIONGAP '11 11 10 11    '$ Lipids No results for input(s): "CHOL", "TRIG", "HDL", "LABVLDL", "LDLCALC", "CHOLHDL" in the last 168 hours.  Hematology Recent Labs  Lab 08/21/22  1705 08/22/22 0412 08/28/22 0423  WBC 9.7 9.1 7.4  RBC 3.97 3.85* 4.18  HGB 10.6* 10.6* 11.3*  HCT 36.0 34.5* 37.5  MCV 90.7 89.6 89.7  MCH 26.7 27.5 27.0  MCHC 29.4* 30.7 30.1  RDW 17.8* 17.9* 19.5*  PLT 227 192 177   Thyroid No results for input(s): "TSH", "FREET4" in the last 168 hours.  BNP Recent Labs  Lab 08/21/22 1705  BNP >4,500.0*    DDimer No results for input(s): "DDIMER" in the last 168 hours.   Radiology    No results found.  Cardiac Studies  TTE 08/22/22 1. Left ventricular ejection fraction, by estimation, is 30 to 35%. The  left ventricle has moderately decreased function. The left ventricle  demonstrates global hypokinesis. There is mild left ventricular  hypertrophy. Left ventricular diastolic  parameters are indeterminate.   2. Right ventricular systolic function is severely reduced. The right  ventricular size is moderately enlarged. Tricuspid regurgitation signal is  inadequate for assessing PA pressure.   3. Right atrial  size was moderately dilated.   4. The mitral valve is normal in structure. Severe mitral valve  regurgitation. No evidence of mitral stenosis.   5. Tricuspid valve regurgitation is severe.   6. The aortic valve is normal in structure. Aortic valve regurgitation is  mild. Aortic valve sclerosis/calcification is present, without any  evidence of aortic stenosis.   7. The inferior vena cava is normal in size with greater than 50%  respiratory variability, suggesting right atrial pressure of 3 mmHg.    Patient Profile     61 y.o. female with history of multiple sclerosis, CAD, severe MR with moderate MS, COPD, prior tobacco use, who has been seen and evaluated for congestive heart failure.  Assessment & Plan   Elevated troponin Prior catheterization April 2021, known coronary artery disease, known proximal RCA occlusion with left-to-right collaterals, moderate mid LAD disease, moderate proximal and mid left circumflex disease Ejection fraction at that time 40 to 45%, moderate pulmonary hypertension -Denies anginal symptoms Repeat echocardiogram ejection fraction down to 30 to 35% Has been evaluated by interventional cardiology, no plan for ischemic work-up at this time  Large volume ascites/cardiorenal syndrome in the setting of cardiomyopathy Responding well to IV diuresis, recently transition to torsemide 40 daily Still making good urine on torsemide LFTs and renal function slowly improving consistent with cardiorenal syndrome Would plan on continued diuresis with torsemide for now Could consider torsemide twice daily  Valvular heart disease Severe MR, severe TR In the setting of RV failure, pulmonary hypertension Contributing to heart failure symptoms as above with anasarca, ascites Would continue aggressive diuresis   Acute on chronic renal failure cardiorenal syndrome, markedly elevated BNP, hepatic congestion on arrival deramatic improvement with IV Lasix, now on torsemide    Discussion with her concerning need for torsemide at home, underlying cardiac disease   Total encounter time more than 50 minutes  Greater than 50% was spent in counseling and coordination of care with the patient   For questions or updates, please contact [place] Please consult [email or web address] for contact info under        Signed, Ida Rogue, MD  08/28/2022, 1:36 PM

## 2022-08-29 DIAGNOSIS — I5043 Acute on chronic combined systolic (congestive) and diastolic (congestive) heart failure: Secondary | ICD-10-CM | POA: Diagnosis not present

## 2022-08-29 DIAGNOSIS — N179 Acute kidney failure, unspecified: Secondary | ICD-10-CM | POA: Diagnosis not present

## 2022-08-29 DIAGNOSIS — G35 Multiple sclerosis: Secondary | ICD-10-CM | POA: Diagnosis not present

## 2022-08-29 DIAGNOSIS — R601 Generalized edema: Secondary | ICD-10-CM | POA: Diagnosis not present

## 2022-08-29 LAB — BASIC METABOLIC PANEL
Anion gap: 10 (ref 5–15)
BUN: 33 mg/dL — ABNORMAL HIGH (ref 6–20)
CO2: 33 mmol/L — ABNORMAL HIGH (ref 22–32)
Calcium: 8.5 mg/dL — ABNORMAL LOW (ref 8.9–10.3)
Chloride: 94 mmol/L — ABNORMAL LOW (ref 98–111)
Creatinine, Ser: 1.45 mg/dL — ABNORMAL HIGH (ref 0.44–1.00)
GFR, Estimated: 42 mL/min — ABNORMAL LOW (ref >60)
Glucose, Bld: 82 mg/dL (ref 70–99)
Potassium: 3 mmol/L — ABNORMAL LOW (ref 3.5–5.1)
Sodium: 137 mmol/L (ref 135–145)

## 2022-08-29 MED ORDER — PANTOPRAZOLE SODIUM 40 MG PO TBEC
40.0000 mg | DELAYED_RELEASE_TABLET | Freq: Every day | ORAL | Status: DC
  Administered 2022-08-30 – 2022-09-01 (×3): 40 mg via ORAL
  Filled 2022-08-29 (×3): qty 1

## 2022-08-29 MED ORDER — POTASSIUM CHLORIDE CRYS ER 20 MEQ PO TBCR
40.0000 meq | EXTENDED_RELEASE_TABLET | Freq: Once | ORAL | Status: AC
  Administered 2022-08-29: 40 meq via ORAL
  Filled 2022-08-29: qty 2

## 2022-08-29 MED ORDER — POTASSIUM CHLORIDE CRYS ER 20 MEQ PO TBCR
40.0000 meq | EXTENDED_RELEASE_TABLET | Freq: Every day | ORAL | Status: DC
  Administered 2022-08-30 – 2022-09-01 (×3): 40 meq via ORAL
  Filled 2022-08-29 (×4): qty 2

## 2022-08-29 NOTE — Progress Notes (Signed)
  [facility]  ROUNDING NOTE   Subjective:   Continues to have adequate urine output  Creatinine 1.45  Assessment/ Plan:  [name] is a 61 y.o.  female with past medical conditions including gout, GERD, COPD, multiple sclerosis, CAD, systolic heart failure with EF 40 to 45%, who was admitted to [facility] on 08/21/2022 for Abdominal distension [R14.0] Elevated troponin [R77.8] Hypervolemia, unspecified hypervolemia type [E87.70]   Acute kidney injury on chronic kidney disease stage IIIa likely due to cardiorenal syndrome.  Baseline creatinine appears to be 1.13 with GFR 56 on 06/14/2021.  Patient currently not followed by outpatient nephrology.  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  No recent IV contrast exposure.   Creatinine slowly improving with adequate urine output.  Lab Results  Component Value Date   CREATININE 1.45 (H) 08/29/2022   CREATININE 1.62 (H) 08/28/2022   CREATININE 1.76 (H) 08/27/2022    Intake/Output Summary (Last 24 hours) at 08/29/2022 1126 Last data filed at 08/29/2022 0639 Gross per 24 hour  Intake 3 ml  Output 600 ml  Net -597 ml     2.  Acute on chronic systolic heart failure.  Echo completed this admission shows EF 30 to 35% with mild LVH, severe mitral valve regurgitation and aortic valve calcification.    Cardiology following. Defer diuretic management to cardiology.    3.  Acute metabolic acidosis.  Serum bicarb on admission 17. Resolved.  Patient has made successful renal recovery so we will sign off at this time. Feel free to contact us with any further questions or concerns.    LOS: 7 Rafael Gonzalez 9/22/202311:26 AM

## 2022-08-29 NOTE — Progress Notes (Signed)
[facility] Progress Note  Patient: Anita [name]    BHA:[id number]  DOA: 08/21/2022    Date of Service: the patient was seen and examined on 08/29/2022  Brief hospital course: 61 year old female with past medical history of multiple sclerosis and systolic/diastolic heart failure with moderate to severe tricuspid regurg and severe mitral regurg presented to the emergency room on 9/14 with complaints of increased facial and abdominal swelling for the past few weeks.  Patient is on diuretics, but has not been taking them as of late.  In the emergency room, patient found to have acute kidney injury with a creatinine of 2.73, BNP of greater than 4500, troponin of 600 and she was given a dose of Lasix in the emergency room.  Cardiology and nephrology consulted.  Following suboptimal response to doses of IV Lasix, patient changed over to Lasix drip to treat volume overload driven by biventricular failure. Per cardiology, unable to add ACE/ARB/ARNI/aldosterone antagonist d/t renal function, and she is not a candidate for repair of valvular disease - cardiac treatment options limited. Palliative care following.   By 9/20, nephrology had converted patient from Lasix drip over to oral Demadex.  Patient continues to have good urine output and improvement in renal function.  Assessment and Plan: Acute on chronic combined systolic and diastolic CHF (congestive heart failure) [facility]) Cardiology following.  Echocardiogram done 9/15 notes ejection fraction of 30 to 35% with severe tricuspid and mitral valve regurg.  Global hypokinesis.  Indeterminate diastolic function.  This looks to be worse than previous echocardiogram done 15 months ago.  After minimal diuresis, pt was placed on Lasix drip on 9/17 as per nephrology recommendations.  By 9/20, drip discontinued and patient started on oral Demadex.  To date, patient has diuresed almost 7.5 L and is almost -7 L deficient.  Continue oral diuretics although,  should diuresis become suboptimal, will switch back to drip.   Please note that as per cardiology's evaluations, these are limited short-term interventions, as long-term, patient has limited options as she is not a can for valve repair/replacement and ARB/ACE inhibitor not felt to be indicated given her renal function but may be able to add this if renal function improves / BP allows.   Anasarca Secondary to CHF.  Right ventricular failure causing transaminitis and overall hepatic congestion.  As above.  Improving.  Acute kidney injury superimposed on stage IIIa chronic kidney disease ([place]) Secondary to decompensated heart failure.  Baseline creatinine around 1.13.  Nephrology following.  No indication for dialysis.  Creatinine slowly improving, down to 1.6 today.  COPD (chronic obstructive pulmonary disease) (HCC) Stable, denies any shortness of breath  Essential hypertension Elevated diastolic blood pressures in part due to right ventricular failure.  Numbers improved with diuresis. Consider ACE/ARB/ARNI/aldosterone antagonist per renal function/nephrology and per cardiology  Transaminitis Secondary to hepatic congestion from heart failure.  Continues to improve as she diureses. Can follow outpatient.   Elevated troponin Demand ischemia versus elevations due to renal failure and heart failure.  Cardiology following.  Cycle as needed.  Patient denies any chest pain at this time.  Multiple sclerosis ([place]) Stable at this time.  Not on any medications.  Coronary artery disease involving native coronary artery of native heart without angina pectoris Cardiology following. At this point no apparent plans for ischemic workup.   IDA (iron deficiency anemia) Monitor periodic CBC, follow outpatient   Tobacco abuse Have provided counseling. Nicotine patch.   Hypokalemia Likely d/t lasix, will replete and continue to  monitor BMP       Body mass index is 21.75 kg/m.         Consultants: Cardiology Nephrology Palliative care  Procedures: Echocardiogram noting worsening ejection fraction of 30 to 35%, severe mitral and tricuspid regurg, severe pulmonary hypertension and indeterminate diastolic function  Antimicrobials: None  Code Status: Full code   Subjective: Feels better, a little stronger  Objective: Vital signs were reviewed and unremarkable. Vitals:   08/29/22 0053 08/29/22 0850  BP: 124/73 123/67  Pulse: 64 71  Resp: 17 16  Temp: 98 F (36.7 C) 97.7 F (36.5 C)  SpO2: 100% 92%    Intake/Output Summary (Last 24 hours) at 08/29/2022 1318 Last data filed at 08/29/2022 6415 Gross per 24 hour  Intake 3 ml  Output 600 ml  Net -597 ml    Filed Weights   08/25/22 0610 08/28/22 0426 08/29/22 0500  Weight: 63.7 kg 58.3 kg 55.7 kg   Body mass index is 21.75 kg/m.  Exam:  General: Alert and oriented x2, no acute distress HEENT: Normocephalic, atraumatic, mucous membranes slightly dry Cardiovascular: Regular rate and rhythm, S1-S2, 2 out of 6 systolic ejection murmur Respiratory: Decreased breath sounds throughout Abdomen: Soft, nontender, less distention, hypoactive bowel sounds Musculoskeletal: No clubbing or cyanosis, trace pitting edema Skin: No skin breaks, tears or lesions Psychiatry: Appropriate, no evidence of psychoses Neurology: No focal deficits  Data Reviewed: Continued improvement in renal function and transaminases  Disposition:  Status is: Inpatient Remains inpatient appropriate because:  -Further diuresis    Anticipated discharge date: 9/24   Family Communication: Left message for daughter DVT Prophylaxis: enoxaparin (LOVENOX) injection 40 mg Start: 08/27/22 2200  Lovenox    Author: Annita Brod ,MD 08/29/2022 1:18 PM  To reach On-call, see care teams to locate the attending and reach out via [email or web address]. Between 7PM-7AM, please contact night-coverage If you still have difficulty reaching  the attending provider, please page the [facility] (Director on Call) for [facility] on amion for assistance.

## 2022-08-29 NOTE — Progress Notes (Signed)
Cardiology Progress Note   Patient Name: Anita [name] Date of Encounter: 08/29/2022  Primary Cardiologist: Buford Dresser, MD  Subjective   Breathing cont to improve.  Abd less firm.  Inpatient Medications    Scheduled Meds:  enoxaparin (LOVENOX) injection  40 mg Subcutaneous Q24H   feeding supplement  237 mL Oral BID BM   ferrous sulfate  325 mg Oral BID WC   fluticasone furoate-vilanterol  1 puff Inhalation Daily   metoprolol succinate  25 mg Oral Daily   pantoprazole  40 mg Oral BID   sodium chloride flush  3 mL Intravenous Q12H   sodium chloride flush  3 mL Intravenous Q12H   torsemide  40 mg Oral Daily   Continuous Infusions:  sodium chloride     PRN Meds: sodium chloride, acetaminophen **OR** acetaminophen, albuterol, ondansetron (ZOFRAN) IV, oxyCODONE, sodium chloride flush, traMADol   Vital Signs    Vitals:   08/28/22 1735 08/29/22 0053 08/29/22 0500 08/29/22 0850  BP: 119/79 124/73  123/67  Pulse: 69 64  71  Resp: '15 17  16  '$ Temp: 98.3 F (36.8 C) 98 F (36.7 C)  97.7 F (36.5 C)  TempSrc:    Oral  SpO2: 100% 100%  92%  Weight:   55.7 kg   Height:        Intake/Output Summary (Last 24 hours) at 08/29/2022 1230 Last data filed at 08/29/2022 5625 Gross per 24 hour  Intake 3 ml  Output 600 ml  Net -597 ml   Filed Weights   08/25/22 0610 08/28/22 0426 08/29/22 0500  Weight: 63.7 kg 58.3 kg 55.7 kg    Physical Exam   GEN: Well nourished, well developed, in no acute distress.  HEENT: Grossly normal.  Neck: Supple, mildly elev JVP, no carotid bruits or masses. Cardiac: RRR, 3/6 syst murmur throughout, no rubs or gallops. No clubbing, cyanosis, edema.  Radials 2+, DP/PT 2+ and equal bilaterally.  Respiratory:  Respirations regular and unlabored, clear to auscultation bilaterally. GI: mildly protuberant, nontender, BS + x 4. MS: no deformity or atrophy. Skin: warm and dry, no rash. Neuro:  Strength and sensation are intact. Psych:  AAOx3.  Normal affect.  Labs    Chemistry Recent Labs  Lab 08/25/22 0626 08/26/22 0427 08/27/22 0641 08/28/22 0423 08/29/22 0624  NA 130* 135 134* 137 137  K 4.5 3.2* 3.1* 3.6 3.0*  CL 96* 96* 96* 96* 94*  CO2 '23 28 28 30 '$ 33*  GLUCOSE 100* 89 80 90 82  BUN 60* 56* 46* 41* 33*  CREATININE 2.24* 2.05* 1.76* 1.62* 1.45*  CALCIUM 8.3* 8.5* 8.4* 8.8* 8.5*  PROT 7.0 7.3  --  7.0  --   ALBUMIN 3.4* 3.5  --  3.4*  --   AST 120* 76*  --  44*  --   ALT 253* 197*  --  121*  --   ALKPHOS 112 113  --  100  --   BILITOT 2.1* 1.7*  --  2.1*  --   GFRNONAA 25* 27* 33* 36* 42*  ANIONGAP '11 11 10 11 10     '$ Hematology Recent Labs  Lab 08/28/22 0423  WBC 7.4  RBC 4.18  HGB 11.3*  HCT 37.5  MCV 89.7  MCH 27.0  MCHC 30.1  RDW 19.5*  PLT 177    Cardiac Enzymes  Recent Labs  Lab 08/21/22 1705 08/21/22 1840 08/23/22 0534  TROPONINIHS 601* 521* 122*      BNP  Component Value Date/Time   BNP >4,500.0 (H) 08/21/2022 1705    Lipids  Lab Results  Component Value Date   CHOL 104 01/08/2021   HDL 40.20 01/08/2021   LDLCALC 51 01/08/2021   TRIG 66.0 01/08/2021   CHOLHDL 3 01/08/2021   Radiology    No results found.  Telemetry    Non-tele  Cardiac Studies   2D Echocardiogram 9.15.2023  TTE 08/22/22 1. Left ventricular ejection fraction, by estimation, is 30 to 35%. The  left ventricle has moderately decreased function. The left ventricle  demonstrates global hypokinesis. There is mild left ventricular  hypertrophy. Left ventricular diastolic  parameters are indeterminate.   2. Right ventricular systolic function is severely reduced. The right  ventricular size is moderately enlarged. Tricuspid regurgitation signal is  inadequate for assessing PA pressure.   3. Right atrial size was moderately dilated.   4. The mitral valve is normal in structure. Severe mitral valve  regurgitation. No evidence of mitral stenosis.   5. Tricuspid valve regurgitation is  severe.   6. The aortic valve is normal in structure. Aortic valve regurgitation is  mild. Aortic valve sclerosis/calcification is present, without any  evidence of aortic stenosis.   7. The inferior vena cava is normal in size with greater than 50%  respiratory variability, suggesting right atrial pressure of 3 mmHg.   Patient Profile     61 y.o. female with history of multiple sclerosis, CAD, severe MR with moderate MS, COPD, prior tobacco use, who has been seen and evaluated for congestive heart failure.   Assessment & Plan    1.  Acute on chronic HFrEF/Ascites:  Transitioned to oral torsemide on 9/21.  Minus 597 yesterday and minus 6.8L since admission.  Wt down to 55.7 kg.  She hovered between 51 - 52 kg last Fall.  Volume status much improved.  Cont torsemide.  Cont ? blocker.  Outpt notes indicate that she did not prev tol entresto due to hypotension.  With cardiorenal, now not ideal time to try low dose ARB/acei, MRA.  Can consider SGLT2i.  2.  Supply-demand ischemia/CAD:  Cath in 03/2020 w/ occluded prox RCA w/ L  R collats, mod mLAD dzs, and mod prox/mid LCX dzs.  EF @ that time was 40-45% w/ mod PAH.  HsTrop 601 on admission w/ downtrend since.  No c/p.  EF down to 30-35%.  Eval by interventional cardiology this admission.  Long-term treatment options for biventricular heart failure due to severe valvular disease felt to be limited.  No plan for further ischemic eval at this time.  Cont ? blocker.  No statin 2/2 elevated LFTs on admission and similar hx as outpt.  No asa 2/2 h/o anemia.  3.  Sev MR/TR/ Mod MS:  noted on echo in setting of severe biventricular dysfunction.  Thickened leaflets w/ rheumatic mvmt.  Likely a poor candidate for surgery and expressed disinterest.  With MS, not a candidate for mitraclip.  4.  Acute on chronic renal failure/cardiorenal syndrome:  Creat coming down w/ diuresis.  Good UO.  Nephrology following.  Signed, Murray Hodgkins, NP  08/29/2022, 12:30 PM     For questions or updates, please contact   Please consult [email or web address] for contact info under Cardiology/STEMI.

## 2022-08-29 NOTE — Plan of Care (Signed)

## 2022-08-29 NOTE — Progress Notes (Signed)
Physical Therapy Treatment Patient Details Name: Anita [name] MRN: [id number] DOB: 08/04/1961 Today's Date: 08/29/2022   History of Present Illness Pt is 61 year old female with past medical history of multiple sclerosis and systolic/diastolic heart failure with moderate to severe tricuspid regurg and severe mitral regurg, HTN, COPD, presented to the emergency room on 9/14 with complaints of increased facial and abdominal swelling for the past few weeks.  Patient is on diuretics, but has not been taking them as of late.  In the emergency room, patient found to have acute kidney injury.    PT Comments    Patient alert, initially not agreeable to any PT or mobility but with max encouragement and education did agree to at least sit up due to her bed being wet with urine. CNA in room to change pt's bed while Pt/PT focused on standing. Supine to sit with minA, pt very fearful and often reaches for PT throughout all mobility. Sit <> Stand with RW and minA, pt yelling at PT to "hold her" even while PT was physically assisting her. She was able to stand with wide BOS for ~30seconds and reliant on RW. When pt returned to sitting, stated she had to urinate but became very agitated with PT when prepped for transfer to [facility] and spontaneously returned to supine, supervision. PT/pt discussed strategies, pt's feelings of frustration and time spent attempting to problem solve to allow pt more autonomy. The patient would benefit from further skilled PT intervention to continue to progress towards goals as able. Recommendation remains appropriate.     Recommendations for follow up therapy are one component of a multi-disciplinary discharge planning process, led by the attending physician.  Recommendations may be updated based on patient status, additional functional criteria and insurance authorization.  Follow Up Recommendations  Skilled nursing-short term rehab (<3 hours/day) Can patient physically be  transported by private vehicle: No   Assistance Recommended at Discharge Frequent or constant Supervision/Assistance  Patient can return home with the following A lot of help with walking and/or transfers;A lot of help with bathing/dressing/bathroom;Assistance with feeding;Assistance with cooking/housework;Help with stairs or ramp for entrance;Direct supervision/assist for medications management;Assist for transportation   Equipment Recommendations  Other (comment) (TBD)    Recommendations for Other Services       Precautions / Restrictions Precautions Precautions: Fall Restrictions Weight Bearing Restrictions: No     Mobility  Bed Mobility Overal bed mobility: Needs Assistance Bed Mobility: Supine to Sit, Sit to Supine     Supine to sit: Min assist Sit to supine: Supervision        Transfers Overall transfer level: Needs assistance Equipment used: Rolling walker (2 wheels) Transfers: Sit to/from Stand Sit to Stand: Min assist           General transfer comment: pt adamant that the PT "hold" her while standing attempt, minA from R side support, pt gripping bed rail very tightly with L hand    Ambulation/Gait               General Gait Details: adamantly refused   Stairs             Wheelchair Mobility    Modified Rankin (Stroke Patients Only)       Balance Overall balance assessment: Needs assistance Sitting-balance support: Bilateral upper extremity supported, Feet unsupported Sitting balance-Leahy Scale: Fair     Standing balance support: Reliant on assistive device for balance Standing balance-Leahy Scale: Fair Standing balance comment: once up in standing with RW, fair  balance noted                            Cognition Arousal/Alertness: Awake/alert Behavior During Therapy: WFL for tasks assessed/performed, Agitated Overall Cognitive Status: Within Functional Limits for tasks assessed                                  General Comments: self limiting, frustrated with PT today        Exercises      General Comments        Pertinent Vitals/Pain Pain Assessment Pain Assessment: Faces Faces Pain Scale: No hurt    Home Living                          Prior Function            PT Goals (current goals can now be found in the care plan section) Progress towards PT goals: Progressing toward goals    Frequency    Min 2X/week      PT Plan Current plan remains appropriate    Co-evaluation              AM-PAC PT "6 Clicks" Mobility   Outcome Measure  Help needed turning from your back to your side while in a flat bed without using bedrails?: A Little Help needed moving from lying on your back to sitting on the side of a flat bed without using bedrails?: A Little Help needed moving to and from a bed to a chair (including a wheelchair)?: A Little Help needed standing up from a chair using your arms (e.g., wheelchair or bedside chair)?: A Little Help needed to walk in hospital room?: A Lot Help needed climbing 3-5 steps with a railing? : Total 6 Click Score: 15    End of Session   Activity Tolerance: Other (comment) (pt self limiting in all mobility tasks) Patient left: with call bell/phone within reach;in bed;with bed alarm set Nurse Communication: Mobility status PT Visit Diagnosis: Other abnormalities of gait and mobility (R26.89);Difficulty in walking, not elsewhere classified (R26.2);Muscle weakness (generalized) (M62.81)     Time: 1025-8527 PT Time Calculation (min) (ACUTE ONLY): 19 min  Charges:  $Therapeutic Activity: 8-22 mins                     Lieutenant Diego PT, DPT 12:42 PM,08/29/22

## 2022-08-30 DIAGNOSIS — I5043 Acute on chronic combined systolic (congestive) and diastolic (congestive) heart failure: Secondary | ICD-10-CM | POA: Diagnosis not present

## 2022-08-30 DIAGNOSIS — R778 Other specified abnormalities of plasma proteins: Secondary | ICD-10-CM | POA: Diagnosis not present

## 2022-08-30 DIAGNOSIS — N179 Acute kidney failure, unspecified: Secondary | ICD-10-CM | POA: Diagnosis not present

## 2022-08-30 DIAGNOSIS — I5023 Acute on chronic systolic (congestive) heart failure: Secondary | ICD-10-CM | POA: Diagnosis not present

## 2022-08-30 LAB — BASIC METABOLIC PANEL
Anion gap: 11 (ref 5–15)
BUN: 31 mg/dL — ABNORMAL HIGH (ref 6–20)
CO2: 32 mmol/L (ref 22–32)
Calcium: 8.4 mg/dL — ABNORMAL LOW (ref 8.9–10.3)
Chloride: 93 mmol/L — ABNORMAL LOW (ref 98–111)
Creatinine, Ser: 1.5 mg/dL — ABNORMAL HIGH (ref 0.44–1.00)
GFR, Estimated: 40 mL/min — ABNORMAL LOW (ref >60)
Glucose, Bld: 84 mg/dL (ref 70–99)
Potassium: 3.1 mmol/L — ABNORMAL LOW (ref 3.5–5.1)
Sodium: 136 mmol/L (ref 135–145)

## 2022-08-30 MED ORDER — SPIRONOLACTONE 25 MG PO TABS: 25.0000 mg | ORAL_TABLET | Freq: Every day | ORAL | Status: DC

## 2022-08-30 NOTE — Progress Notes (Signed)
Cardiology Progress Note   Patient Name: Anita [name] Date of Encounter: 08/30/2022  Primary Cardiologist: Anita Dresser, MD  Subjective   Breathing about the same.  Abd remains protuberant and semi-firm, no significant difference compared to yesterday per patient.  Denies pain.  Inpatient Medications    Scheduled Meds:  enoxaparin (LOVENOX) injection  40 mg Subcutaneous Q24H   feeding supplement  237 mL Oral BID BM   ferrous sulfate  325 mg Oral BID WC   fluticasone furoate-vilanterol  1 puff Inhalation Daily   metoprolol succinate  25 mg Oral Daily   pantoprazole  40 mg Oral Daily   potassium chloride  40 mEq Oral Daily   sodium chloride flush  3 mL Intravenous Q12H   sodium chloride flush  3 mL Intravenous Q12H   torsemide  40 mg Oral Daily   Continuous Infusions:  sodium chloride     PRN Meds: sodium chloride, acetaminophen **OR** acetaminophen, albuterol, ondansetron (ZOFRAN) IV, oxyCODONE, sodium chloride flush, traMADol   Vital Signs    Vitals:   08/29/22 0500 08/29/22 0850 08/29/22 1742 08/29/22 2339  BP:  123/67 125/67 (!) 101/57  Pulse:  71 75 76  Resp:  '16 20 16  '$ Temp:  97.7 F (36.5 C) 98 F (36.7 C) 98.9 F (37.2 C)  TempSrc:  Oral    SpO2:  92% 96% 99%  Weight: 55.7 kg     Height:        Intake/Output Summary (Last 24 hours) at 08/30/2022 0842 Last data filed at 08/30/2022 0437 Gross per 24 hour  Intake 3 ml  Output 350 ml  Net -347 ml   Filed Weights   08/25/22 0610 08/28/22 0426 08/29/22 0500  Weight: 63.7 kg 58.3 kg 55.7 kg    Physical Exam   GEN: Thin, frail, in no acute distress.  HEENT: Grossly normal.  Neck: Supple, mildly elevated JVP.  No bruits or masses.   Cardiac: RRR, 3/6 systolic murmur from the left lower sternal border to the left axillary line. No clubbing, cyanosis, edema.  Radials 2+, DP/PT 2+ and equal bilaterally.  Respiratory:  Respirations regular and unlabored, diminished breath sounds  bilaterally. GI: Semifirm and protuberant.  Nontender. BS + x 4. MS: no deformity or atrophy. Skin: warm and dry, no rash. Neuro:  Strength and sensation are intact. Psych: AAOx3.  Normal affect.  Labs    Chemistry Recent Labs  Lab 08/25/22 0626 08/26/22 0427 08/27/22 0641 08/28/22 0423 08/29/22 0624  NA 130* 135 134* 137 137  K 4.5 3.2* 3.1* 3.6 3.0*  CL 96* 96* 96* 96* 94*  CO2 '23 28 28 30 '$ 33*  GLUCOSE 100* 89 80 90 82  BUN 60* 56* 46* 41* 33*  CREATININE 2.24* 2.05* 1.76* 1.62* 1.45*  CALCIUM 8.3* 8.5* 8.4* 8.8* 8.5*  PROT 7.0 7.3  --  7.0  --   ALBUMIN 3.4* 3.5  --  3.4*  --   AST 120* 76*  --  44*  --   ALT 253* 197*  --  121*  --   ALKPHOS 112 113  --  100  --   BILITOT 2.1* 1.7*  --  2.1*  --   GFRNONAA 25* 27* 33* 36* 42*  ANIONGAP '11 11 10 11 10     '$ Hematology Recent Labs  Lab 08/28/22 0423  WBC 7.4  RBC 4.18  HGB 11.3*  HCT 37.5  MCV 89.7  MCH 27.0  MCHC 30.1  RDW 19.5*  PLT  177    Cardiac Enzymes  Recent Labs  Lab 08/21/22 1705 08/21/22 1840 08/23/22 0534  TROPONINIHS 601* 521* 122*      BNP    Component Value Date/Time   BNP >4,500.0 (H) 08/21/2022 1705   Lipids  Lab Results  Component Value Date   CHOL 104 01/08/2021   HDL 40.20 01/08/2021   LDLCALC 51 01/08/2021   TRIG 66.0 01/08/2021   CHOLHDL 3 01/08/2021   Radiology    No results found.  Telemetry    Non-tele  Cardiac Studies   2D Echocardiogram 9.15.2023   TTE 08/22/22 1. Left ventricular ejection fraction, by estimation, is 30 to 35%. The  left ventricle has moderately decreased function. The left ventricle  demonstrates global hypokinesis. There is mild left ventricular  hypertrophy. Left ventricular diastolic  parameters are indeterminate.   2. Right ventricular systolic function is severely reduced. The right  ventricular size is moderately enlarged. Tricuspid regurgitation signal is  inadequate for assessing PA pressure.   3. Right atrial size was  moderately dilated.   4. The mitral valve is normal in structure. Severe mitral valve  regurgitation. No evidence of mitral stenosis.   5. Tricuspid valve regurgitation is severe.   6. The aortic valve is normal in structure. Aortic valve regurgitation is  mild. Aortic valve sclerosis/calcification is present, without any  evidence of aortic stenosis.   7. The inferior vena cava is normal in size with greater than 50%  respiratory variability, suggesting right atrial pressure of 3 mmHg.   Patient Profile     61 y.o. female with history of multiple sclerosis, CAD, severe MR with moderate MS, COPD, prior tobacco use, who has been seen and evaluated for congestive heart failure.  Assessment & Plan    1.  Acute on chronic HFrEF/Ascites:  Transitioned to oral torsemide on 9/21.  Only minus 347 recorded yesterday, though pt notes frequent incontinence.  No intake recorded.  Wt not yet recorded this AM.  BMET pending.  Abdomen remains protuberant and semifirm with mildly elevated JVP.  Continue torsemide 40 mg daily.  Continue beta-blocker.  Did not previously tolerate Entresto secondary to hypotension, and pressures here have been variable and sometimes soft.  If renal function remains stable, would likely benefit from SGLT2 inhibitor.  Prev experienced dehydration in early 2023 on torsemide 40 daily @ home, has since been using PRN, but given massive volume overload on admission, she was not taking when needed.  Will need at least torsemide '20mg'$  daily @ the time of d/c.  2.  Supply-demand ischemia/CAD:  Cath in 03/2020 w/ occluded prox RCA w/ L  R collats, mod mLAD dzs, and mod prox/mid LCX dzs.  EF @ that time was 40-45% w/ mod PAH.  HsTrop 601 on admission w/ downtrend since.  No c/p.  EF down to 30-35%.  Eval by interventional cardiology this admission.  Long-term treatment options for biventricular heart failure due to severe valvular disease felt to be limited.  No plan for further ischemic eval at  this time.  Cont ? blocker.  No statin 2/2 elevated LFTs on admission and similar hx as outpt.  No asa 2/2 h/o anemia.   3.  Sev MR/TR/ Mod MS:  noted on echo in setting of severe biventricular dysfunction.  Followed by Dr. Harrell Gave as an outpatient.  Thickened leaflets w/ rheumatic mvmt.  Likely a poor candidate for surgery and expressed disinterest previously.  With MS, not a candidate for mitraclip.  4.  Acute on chronic renal failure/cardiorenal syndrome:  bmet pending this AM.  UO inaccurate.  5.  Hypokalemia: Basic metabolic panel pending.  Signed, Murray Hodgkins, NP  08/30/2022, 8:42 AM    For questions or updates, please contact   Please consult [email or web address] for contact info under Cardiology/STEMI.

## 2022-08-30 NOTE — Plan of Care (Signed)

## 2022-08-30 NOTE — Progress Notes (Signed)
[facility]  PROGRESS NOTE   Subjective:   Awake and alert.  Objective:  Vital signs: Blood pressure (!) 144/84, pulse 78, temperature 97.8 F (36.6 C), temperature source Oral, resp. rate 17, height '5\' 3"'$  (1.6 m), weight 55.7 kg, SpO2 100 %.  Intake/Output Summary (Last 24 hours) at 08/30/2022 1409 Last data filed at 08/30/2022 1405 Gross per 24 hour  Intake 366 ml  Output 900 ml  Net -534 ml   Filed Weights   08/25/22 0610 08/28/22 0426 08/29/22 0500  Weight: 63.7 kg 58.3 kg 55.7 kg     Physical Exam: General:  No acute distress  Head:  Normocephalic, atraumatic. Moist oral mucosal membranes  Eyes:  Anicteric  Neck:  Supple  Lungs:   Clear to auscultation, normal effort  Heart:  S1S2 no rubs  Abdomen:   Soft, nontender, bowel sounds present  Extremities: 1+ peripheral edema.  Neurologic:  Awake, alert, following commands  Skin:  No lesions  Access:     Basic Metabolic Panel: Recent Labs  Lab 08/26/22 0427 08/27/22 0641 08/28/22 0423 08/29/22 0624 08/30/22 0810  NA 135 134* 137 137 136  K 3.2* 3.1* 3.6 3.0* 3.1*  CL 96* 96* 96* 94* 93*  CO2 '28 28 30 '$ 33* 32  GLUCOSE 89 80 90 82 84  BUN 56* 46* 41* 33* 31*  CREATININE 2.05* 1.76* 1.62* 1.45* 1.50*  CALCIUM 8.5* 8.4* 8.8* 8.5* 8.4*    CBC: Recent Labs  Lab 08/28/22 0423  WBC 7.4  HGB 11.3*  HCT 37.5  MCV 89.7  PLT 177     Urinalysis: No results for input(s): "COLORURINE", "LABSPEC", "PHURINE", "GLUCOSEU", "HGBUR", "BILIRUBINUR", "KETONESUR", "PROTEINUR", "UROBILINOGEN", "NITRITE", "LEUKOCYTESUR" in the last 72 hours.  Invalid input(s): "APPERANCEUR"    Imaging: No results found.   Medications:    sodium chloride      enoxaparin (LOVENOX) injection  40 mg Subcutaneous Q24H   feeding supplement  237 mL Oral BID BM   ferrous sulfate  325 mg Oral BID WC   fluticasone furoate-vilanterol  1 puff Inhalation Daily   metoprolol succinate  25 mg Oral Daily   pantoprazole  40 mg  Oral Daily   potassium chloride  40 mEq Oral Daily   sodium chloride flush  3 mL Intravenous Q12H   sodium chloride flush  3 mL Intravenous Q12H   torsemide  40 mg Oral Daily    Assessment/ Plan:     Active Problems:   Multiple sclerosis (HCC)   Gastroesophageal reflux disease   Essential hypertension   Acute kidney injury superimposed on stage IIIa chronic kidney disease (HCC)   Tobacco abuse   Elevated troponin   Acute on chronic HFrEF (heart failure with reduced ejection fraction) (HCC)   COPD (chronic obstructive pulmonary disease) (HCC)   IDA (iron deficiency anemia)   Acute on chronic combined systolic and diastolic CHF (congestive heart failure) (HCC)   Coronary artery disease involving native coronary artery of native heart without angina pectoris   Anasarca   Transaminitis   Hypokalemia   Hypervolemia   Cardiorenal syndrome  61 year old female with history of multiple sclerosis, hypertension, coronary artery disease, congestive heart failure, COPD and anasarca with CKD.  #1: Congestive heart failure/anasarca: Anasarca secondary to congestive heart failure.  Patient has been on torsemide 40 mg daily.  We will continue the same with potassium supplements and fluid restriction.  #2: Hypertension: Continue the metoprolol as ordered.  She may benefit from Ace inhibitor/ARB for long-term cardiorenal  protection.  #3: Hypokalemia: Hypokalemia secondary to diuretics.  Continue supplementation of potassium.  #4: Chronic kidney disease: Patient is stage IIIb CKD with a GFR of about 40 cc/min.  We will continue to monitor closely.  #5: Anemia: Anemia is most likely secondary to chronic kidney disease.   LOS: [place] 9/23/20232:09 PM

## 2022-08-30 NOTE — Progress Notes (Signed)
[facility] Progress Note  Patient: Anita [name]    OVZ:[id number]  DOA: 08/21/2022    Date of Service: the patient was seen and examined on 08/30/2022  Brief hospital course: 61 year old female with past medical history of multiple sclerosis and systolic/diastolic heart failure with moderate to severe tricuspid regurg and severe mitral regurg presented to the emergency room on 9/14 with complaints of increased facial and abdominal swelling for the past few weeks.  Patient is on diuretics, but has not been taking them as of late.  In the emergency room, patient found to have acute kidney injury with a creatinine of 2.73, BNP of greater than 4500, troponin of 600 and she was given a dose of Lasix in the emergency room.  Cardiology and nephrology consulted.  Following suboptimal response to doses of IV Lasix, patient changed over to Lasix drip to treat volume overload driven by biventricular failure. Per cardiology, unable to add ACE/ARB/ARNI/aldosterone antagonist d/t renal function, and she is not a candidate for repair of valvular disease - cardiac treatment options limited. Palliative care following.   By 9/20, nephrology had converted patient from Lasix drip over to oral Demadex.  Patient continues to have good urine output and improvement in renal function.  Assessment and Plan: Acute on chronic combined systolic and diastolic CHF (congestive heart failure) [facility]) Cardiology following.  Echocardiogram done 9/15 notes ejection fraction of 30 to 35% with severe tricuspid and mitral valve regurg.  Global hypokinesis.  Indeterminate diastolic function.  This looks to be worse than previous echocardiogram done 15 months ago.  After minimal diuresis, pt was placed on Lasix drip on 9/17 as per nephrology recommendations.  By 9/20, drip discontinued and patient started on oral Demadex.  To date, patient has diuresed over 8L and is almost -7.5 L deficient.  Continue oral diuretics although, should  diuresis become suboptimal, will switch back to drip.   Please note that as per cardiology's evaluations, these are limited short-term interventions, as long-term, patient has limited options as she is not a can for valve repair/replacement and ARB/ACE inhibitor not felt to be indicated given her renal function but may be able to add this if renal function improves / BP allows.   Anasarca Secondary to CHF.  Right ventricular failure causing transaminitis and overall hepatic congestion.  As above.  Improving.  Acute kidney injury superimposed on stage IIIa chronic kidney disease ([place]) Secondary to decompensated heart failure.  Baseline creatinine around 1.13.  Nephrology following.  No indication for dialysis.  Creatinine slowly improving, down to 1.45 today.  COPD (chronic obstructive pulmonary disease) (HCC) Stable, denies any shortness of breath  Essential hypertension Elevated diastolic blood pressures in part due to right ventricular failure.  Numbers improved with diuresis. Consider ACE/ARB/ARNI/aldosterone antagonist per renal function/nephrology and per cardiology  Transaminitis Secondary to hepatic congestion from heart failure.  Continues to improve as she diureses. Can follow outpatient.   Elevated troponin Demand ischemia versus elevations due to renal failure and heart failure.  Cardiology following.  Cycle as needed.  Patient denies any chest pain at this time.  Multiple sclerosis ([place]) Stable at this time.  Not on any medications.  Coronary artery disease involving native coronary artery of native heart without angina pectoris Cardiology following. At this point no apparent plans for ischemic workup.   IDA (iron deficiency anemia) Monitor periodic CBC, follow outpatient   Tobacco abuse Have provided counseling. Nicotine patch.   Hypokalemia Likely d/t lasix, will replete and continue to monitor  BMP       Body mass index is 21.75 kg/m.         Consultants: Cardiology Nephrology Palliative care  Procedures: Echocardiogram noting worsening ejection fraction of 30 to 35%, severe mitral and tricuspid regurg, severe pulmonary hypertension and indeterminate diastolic function  Antimicrobials: None  Code Status: Full code   Subjective: No complaints  Objective: Vital signs were reviewed and unremarkable. Vitals:   08/29/22 2339 08/30/22 0858  BP: (!) 101/57 (!) 144/84  Pulse: 76 78  Resp: 16 17  Temp: 98.9 F (37.2 C) 97.8 F (36.6 C)  SpO2: 99% 100%    Intake/Output Summary (Last 24 hours) at 08/30/2022 1352 Last data filed at 08/30/2022 1031 Gross per 24 hour  Intake 126 ml  Output 900 ml  Net -774 ml    Filed Weights   08/25/22 0610 08/28/22 0426 08/29/22 0500  Weight: 63.7 kg 58.3 kg 55.7 kg   Body mass index is 21.75 kg/m.  Exam:  General: Resting comfortably HEENT: Normocephalic, atraumatic, mucous membranes slightly dry Cardiovascular: Regular rate and rhythm, S1-S2, 2 out of 6 systolic ejection murmur Respiratory: Decreased breath sounds throughout Abdomen: Soft, nontender, less distention, hypoactive bowel sounds Musculoskeletal: No clubbing or cyanosis, trace pitting edema Skin: No skin breaks, tears or lesions Psychiatry: Appropriate, no evidence of psychoses Neurology: No focal deficits  Data Reviewed: Creatinine slightly increased today  Disposition:  Status is: Inpatient Remains inpatient appropriate because:  -Further diuresis    Anticipated discharge date: 9/24   Family Communication: Left message for daughter DVT Prophylaxis: enoxaparin (LOVENOX) injection 40 mg Start: 08/27/22 2200  Lovenox    Author: Annita Brod ,MD 08/30/2022 1:52 PM  To reach On-call, see care teams to locate the attending and reach out via [email or web address]. Between 7PM-7AM, please contact night-coverage If you still have difficulty reaching the attending provider, please page the [facility]  (Director on Call) for [facility] on amion for assistance.

## 2022-08-31 DIAGNOSIS — G35 Multiple sclerosis: Secondary | ICD-10-CM | POA: Diagnosis not present

## 2022-08-31 DIAGNOSIS — I10 Essential (primary) hypertension: Secondary | ICD-10-CM | POA: Diagnosis not present

## 2022-08-31 DIAGNOSIS — N179 Acute kidney failure, unspecified: Secondary | ICD-10-CM | POA: Diagnosis not present

## 2022-08-31 DIAGNOSIS — I5043 Acute on chronic combined systolic (congestive) and diastolic (congestive) heart failure: Secondary | ICD-10-CM | POA: Diagnosis not present

## 2022-08-31 LAB — BASIC METABOLIC PANEL
Anion gap: 11 (ref 5–15)
BUN: 28 mg/dL — ABNORMAL HIGH (ref 6–20)
CO2: 34 mmol/L — ABNORMAL HIGH (ref 22–32)
Calcium: 8.6 mg/dL — ABNORMAL LOW (ref 8.9–10.3)
Chloride: 92 mmol/L — ABNORMAL LOW (ref 98–111)
Creatinine, Ser: 1.64 mg/dL — ABNORMAL HIGH (ref 0.44–1.00)
GFR, Estimated: 36 mL/min — ABNORMAL LOW (ref >60)
Glucose, Bld: 92 mg/dL (ref 70–99)
Potassium: 3.4 mmol/L — ABNORMAL LOW (ref 3.5–5.1)
Sodium: 137 mmol/L (ref 135–145)

## 2022-08-31 NOTE — Plan of Care (Signed)

## 2022-08-31 NOTE — Progress Notes (Signed)
PT Cancellation Note  Patient Details Name: CARMELIA TINER MRN: [id number] DOB: October 18, 1961   Cancelled Treatment:    Reason Eval/Treat Not Completed: Other (comment)  Offered session this am.  She c/o back discomfort.  Encouraged mobility, sitting in chair or EOB, exercises and she declined all offers and encouragement.  "I'd rather not"  Educated on importance of mobility for strength and pain management but she continued to decline.   Chesley Noon 08/31/2022, 1:18 PM

## 2022-08-31 NOTE — TOC Progression Note (Signed)
Transition of Care [facility]) - Progression Note    Patient Details  Name: Anita [name] MRN: [id number] Date of Birth: 1961/02/15  Transition [facility]) CM/SW Contact  Izola Price, RN Phone Number: 08/31/2022, 12:47 PM  Clinical Narrative:  9/24: Montey Hora to confirm as [facility] agency via [place] as mentioned in prior CM notes. EDD for Monday 09/01/22. Malachy Mood said yes, they have had patient before. Simmie Davies RN CM      Expected Discharge Plan: [place] Barriers to Discharge: Continued Medical Work up  Expected Discharge Plan and Services Expected Discharge Plan: [place]   Discharge Planning Services: CM Consult   Living arrangements for the past 2 months: Single Family Home                 DME Arranged: N/A         HH Arranged: PT, OT           Social Determinants of Health (SDOH) Interventions    Readmission Risk Interventions    06/06/2021    9:43 AM  Readmission Risk Prevention Plan  Transportation Screening Complete  PCP or Specialist Appt within 3-5 Days Complete  HRI or Home Care Consult Complete  Social Work Consult for [place] Planning/Counseling Not Complete  SW consult not completed comments RNCM assigned to case  Palliative Care Screening Not Applicable  Medication Review [employer]) Complete

## 2022-08-31 NOTE — Progress Notes (Signed)
[facility]  PROGRESS NOTE   Subjective:   Patient seen at bedside comfortable.  Objective:  Vital signs: Blood pressure (!) 101/59, pulse 69, temperature 97.6 F (36.4 C), resp. rate 16, height '5\' 3"'$  (1.6 m), weight 52.7 kg, SpO2 100 %.  Intake/Output Summary (Last 24 hours) at 08/31/2022 1248 Last data filed at 08/30/2022 2107 Gross per 24 hour  Intake 243 ml  Output 850 ml  Net -607 ml   Filed Weights   08/28/22 0426 08/29/22 0500 08/31/22 0500  Weight: 58.3 kg 55.7 kg 52.7 kg     Physical Exam: General:  No acute distress  Head:  Normocephalic, atraumatic. Moist oral mucosal membranes  Eyes:  Anicteric  Neck:  Supple  Lungs:   Clear to auscultation, normal effort  Heart:  S1S2 no rubs  Abdomen:   Soft, nontender, bowel sounds present  Extremities:  peripheral edema.  Neurologic:  Awake, alert, following commands  Skin:  No lesions  Access:     Basic Metabolic Panel: Recent Labs  Lab 08/27/22 0641 08/28/22 0423 08/29/22 0624 08/30/22 0810 08/31/22 0559  NA 134* 137 137 136 137  K 3.1* 3.6 3.0* 3.1* 3.4*  CL 96* 96* 94* 93* 92*  CO2 28 30 33* 32 34*  GLUCOSE 80 90 82 84 92  BUN 46* 41* 33* 31* 28*  CREATININE 1.76* 1.62* 1.45* 1.50* 1.64*  CALCIUM 8.4* 8.8* 8.5* 8.4* 8.6*    CBC: Recent Labs  Lab 08/28/22 0423  WBC 7.4  HGB 11.3*  HCT 37.5  MCV 89.7  PLT 177     Urinalysis: No results for input(s): "COLORURINE", "LABSPEC", "PHURINE", "GLUCOSEU", "HGBUR", "BILIRUBINUR", "KETONESUR", "PROTEINUR", "UROBILINOGEN", "NITRITE", "LEUKOCYTESUR" in the last 72 hours.  Invalid input(s): "APPERANCEUR"    Imaging: No results found.   Medications:    sodium chloride      enoxaparin (LOVENOX) injection  40 mg Subcutaneous Q24H   feeding supplement  237 mL Oral BID BM   ferrous sulfate  325 mg Oral BID WC   fluticasone furoate-vilanterol  1 puff Inhalation Daily   metoprolol succinate  25 mg Oral Daily   pantoprazole  40 mg Oral  Daily   potassium chloride  40 mEq Oral Daily   sodium chloride flush  3 mL Intravenous Q12H   sodium chloride flush  3 mL Intravenous Q12H    Assessment/ Plan:     Active Problems:   Multiple sclerosis (HCC)   Gastroesophageal reflux disease   Essential hypertension   Acute kidney injury superimposed on stage IIIa chronic kidney disease (HCC)   Tobacco abuse   Elevated troponin   Acute on chronic HFrEF (heart failure with reduced ejection fraction) (HCC)   COPD (chronic obstructive pulmonary disease) (HCC)   IDA (iron deficiency anemia)   Acute on chronic combined systolic and diastolic CHF (congestive heart failure) (HCC)   Coronary artery disease involving native coronary artery of native heart without angina pectoris   Anasarca   Transaminitis   Hypokalemia   Hypervolemia   Cardiorenal syndrome  61 year old female with history of multiple sclerosis, hypertension, coronary artery disease, congestive heart failure, COPD and anasarca with CKD.   #1: Congestive heart failure/anasarca: Anasarca secondary to congestive heart failure.  Patient has been on torsemide 40 mg daily.  We will continue the same with potassium supplements and fluid restriction.   #2: Hypertension: Continue the metoprolol as ordered.  She may benefit from Ace inhibitor/ARB for long-term cardiorenal protection.   #3: Hypokalemia: Hypokalemia secondary  to diuretics.  Continue supplementation of potassium.   #4: Chronic kidney disease: Patient is stage IIIb CKD with a GFR of about 40 cc/min.  We will continue to monitor closely.   #5: Anemia: Anemia is most likely secondary to chronic kidney disease.   LOS: [place] 9/24/202312:48 PM

## 2022-08-31 NOTE — Discharge Summary (Signed)
Physician Discharge Summary   Patient: Anita [name] MRN: [id number] DOB: [birth year]  Admit date:     08/21/2022  Anticipated discharge date: 09/01/22  Discharge Physician: Annita Brod   PCP: [name], NP   Recommendations at discharge:   Patient being discharged with home health PT and OT  Discharge Diagnoses: Active Problems:   Acute on chronic combined systolic and diastolic CHF (congestive heart failure) (HCC)   Anasarca   Acute kidney injury superimposed on stage IIIa chronic kidney disease (HCC)   COPD (chronic obstructive pulmonary disease) (HCC)   Essential hypertension   Elevated troponin   Transaminitis   Multiple sclerosis (HCC)   Coronary artery disease involving native coronary artery of native heart without angina pectoris   Gastroesophageal reflux disease   IDA (iron deficiency anemia)   Tobacco abuse   Acute on chronic HFrEF (heart failure with reduced ejection fraction) (HCC)   Hypokalemia   Hypervolemia   Cardiorenal syndrome  Resolved Problems:   * No resolved hospital problems. *  Hospital Course: 61 year old female with past medical history of multiple sclerosis and systolic/diastolic heart failure with moderate to severe tricuspid regurg and severe mitral regurg presented to the emergency room on 9/14 with complaints of increased facial and abdominal swelling for the past few weeks.  Patient is on diuretics, but has not been taking them as of late.  In the emergency room, patient found to have acute kidney injury with a creatinine of 2.73, BNP of greater than 4500, troponin of 600 and she was given a dose of Lasix in the emergency room.  Cardiology and nephrology consulted.  Following suboptimal response to doses of IV Lasix, patient changed over to Lasix drip to treat volume overload driven by biventricular failure. Per cardiology, unable to add ACE/ARB/ARNI/aldosterone antagonist d/t renal function, and she is not a candidate for  repair of valvular disease - cardiac treatment options limited. Palliative care following.   By 9/20, nephrology had converted patient from Lasix drip over to oral Demadex.  Patient continues to have good urine output and improvement in renal function.  By 9/24, felt to be fully diuresed and Demadex briefly stopped due to overdiuresis.  Assessment and Plan: Acute on chronic combined systolic and diastolic CHF (congestive heart failure) [facility]) Cardiology following.  Echocardiogram done 9/15 notes ejection fraction of 30 to 35% with severe tricuspid and mitral valve regurg.  Global hypokinesis.  Indeterminate diastolic function.  This looks to be worse than previous echocardiogram done 15 months ago.  After minimal diuresis, pt was placed on Lasix drip on 9/17 as per nephrology recommendations.  By 9/20, drip discontinued and patient started on oral Demadex.  To date, patient has diuresed over 8L and is almost -7.5 L deficient.  Has been on oral Demadex and by 9/24, felt to be fully diuresed and okay for discharge.   Please note that as per cardiology's evaluations, these are limited short-term interventions, as long-term, patient has limited options as she is not a can for valve repair/replacement and ARB/ACE inhibitor not felt to be indicated given her renal function but may be able to add this if renal function improves / BP allows.   Anasarca Secondary to CHF.  Right ventricular failure causing transaminitis and overall hepatic congestion.  As above.  Improving.  Acute kidney injury superimposed on stage IIIa chronic kidney disease ([place]) Secondary to decompensated heart failure.  Baseline creatinine around 1.13.  Nephrology following.  No indication for dialysis.  Creatinine down  to as low as 1.45 although did trend up slightly after.  We will recheck in the morning, have stopped Demadex for mild overdiuresis.  This is likely her new normal at stage IIIb.  COPD (chronic obstructive pulmonary  disease) (HCC) Stable, denies any shortness of breath  Essential hypertension Elevated diastolic blood pressures in part due to right ventricular failure.  Numbers improved with diuresis. Consider ACE/ARB/ARNI/aldosterone antagonist per renal function/nephrology and per cardiology  Transaminitis Secondary to hepatic congestion from heart failure.  Continues to improve as she diureses. Can follow outpatient.   Elevated troponin Demand ischemia versus elevations due to renal failure and heart failure.  Cardiology following.  Cycle as needed.  Patient denies any chest pain at this time.  Multiple sclerosis ([place]) Stable at this time.  Not on any medications.  Coronary artery disease involving native coronary artery of native heart without angina pectoris Cardiology following. At this point no apparent plans for ischemic workup.   IDA (iron deficiency anemia) Monitor periodic CBC, follow outpatient   Tobacco abuse Have provided counseling. Nicotine patch.   Hypokalemia Likely d/t lasix, will replete and continue to monitor BMP         Consultants: Cardiology Nephrology Palliative care   Procedures: Echocardiogram noting worsening ejection fraction of 30 to 35%, severe mitral and tricuspid regurg, severe pulmonary hypertension and indeterminate diastolic function  Disposition: Home with home health Diet recommendation:  Discharge Diet Orders (From admission, onward)     Start     Ordered   08/31/22 0000  Diet - low sodium heart healthy        08/31/22 1347           Cardiac diet DISCHARGE MEDICATION: Allergies as of 08/31/2022       Reactions   Acthar Hp [corticotropin] Other (See Comments)   Throat swelling and coughing up blood   Codeine    Prednisone         Medication List     TAKE these medications    Advair Diskus 250-50 MCG/ACT Aepb Generic drug: fluticasone-salmeterol INHALE 1 PUFF INTO THE LUNGS TWICE A DAY   albuterol 108 (90 Base)  MCG/ACT inhaler Commonly known as: VENTOLIN HFA INHALE 1-2 PUFFS INTO THE LUNGS EVERY 6 (SIX) HOURS AS NEEDED FOR WHEEZING OR SHORTNESS OF BREATH. USE SPARINGLY!   cetirizine 10 MG tablet Commonly known as: ZYRTEC Take 10 mg by mouth daily.   erythromycin ophthalmic ointment SMARTSIG:1 sparingly In Eye(s) 3 Times Daily   feeding supplement Liqd Take 237 mLs by mouth 2 (two) times daily between meals.   ferrous sulfate 325 (65 FE) MG EC tablet Take 1 tablet (325 mg total) by mouth 2 (two) times daily with a meal.   Flintstones Multivitamin Chew Chew by mouth.   guaiFENesin 600 MG 12 hr tablet Commonly known as: MUCINEX Take 600 mg by mouth 2 (two) times daily as needed for cough.   metoprolol succinate 25 MG 24 hr tablet Commonly known as: TOPROL-XL Take 1 tablet (25 mg total) by mouth daily. Take with or immediately following a meal.   nitroGLYCERIN 0.4 MG SL tablet Commonly known as: NITROSTAT Place 1 tablet (0.4 mg total) under the tongue every 5 (five) minutes as needed for chest pain.   omeprazole 40 MG capsule Commonly known as: PRILOSEC Take 1 capsule (40 mg total) by mouth daily.   Probiotic Advanced Caps Take 1 capsule by mouth daily.   SLEEP AID PO Take 1 tablet by mouth at bedtime  as needed (sleep).   torsemide 20 MG tablet Commonly known as: Demadex Take for weight gain of 2 lbs in 1 day or 5 lbs in 1 week or obvious swelling.   trolamine salicylate 10 % cream Commonly known as: ASPERCREME Apply 1 application. topically daily as needed for muscle pain (back or joint pain).   vitamin B-12 500 MCG tablet Commonly known as: CYANOCOBALAMIN Take 1,000 mcg by mouth daily. 4 times weekly no more then 2000 MCG per week        Follow-up Information     Loel Dubonnet, NP Follow up on 09/05/2022.   Specialty: Cardiology Why: 2:20 PM Contact information: [place] [address] [phone]                Discharge  Exam: Filed Weights   08/28/22 0426 08/29/22 0500 08/31/22 0500  Weight: 58.3 kg 55.7 kg 52.7 kg   General: Alert and oriented x2, no acute distress Cardiovascular: Regular rate and rhythm, S1-S2, 2 out of 6 systolic ejection murmur Lungs: Clear to auscultation bilaterally  Condition at discharge: fair  The results of significant diagnostics from this hospitalization (including imaging, microbiology, ancillary and laboratory) are listed below for reference.   Imaging Studies: ECHOCARDIOGRAM COMPLETE  Result Date: 08/22/2022    ECHOCARDIOGRAM REPORT   Patient Name:   MECHELLE PATES Date of Exam: 08/22/2022 Medical Rec #:  [id number]             Height:       63.0 in Accession #:    [id number]            Weight:       139.3 lb Date of Birth:  [birth year]             BSA:          1.658 m Patient Age:    44 years              BP:           134/89 mmHg Patient Gender: F                     HR:           85 bpm. Exam Location:  [facility] Procedure: 2D Echo, Color Doppler and Cardiac Doppler Indications:     Dyspnea R06.00  History:         Patient has prior history of Echocardiogram examinations, most                  recent 02/07/2021. CHF; COPD. Severe Mitral regurgitation.  Sonographer:     Sherrie Sport Referring Phys:  Tarri Glenn Diagnosing Phys: Ida Rogue MD  Sonographer Comments: Technically challenging study due to limited acoustic windows, no subcostal window and suboptimal apical window. IMPRESSIONS  1. Left ventricular ejection fraction, by estimation, is 30 to 35%. The left ventricle has moderately decreased function. The left ventricle demonstrates global hypokinesis. There is mild left ventricular hypertrophy. Left ventricular diastolic parameters are indeterminate.  2. Right ventricular systolic function is severely reduced. The right ventricular size is moderately enlarged. Tricuspid regurgitation signal is inadequate for assessing PA pressure.  3. Right atrial size was moderately  dilated.  4. The mitral valve is normal in structure. Severe mitral valve regurgitation. No evidence of mitral stenosis.  5. Tricuspid valve regurgitation is severe.  6. The aortic valve is normal in structure. Aortic valve regurgitation is mild. Aortic valve sclerosis/calcification is  present, without any evidence of aortic stenosis.  7. The inferior vena cava is normal in size with greater than 50% respiratory variability, suggesting right atrial pressure of 3 mmHg. FINDINGS  Left Ventricle: Left ventricular ejection fraction, by estimation, is 30 to 35%. The left ventricle has moderately decreased function. The left ventricle demonstrates global hypokinesis. The left ventricular internal cavity size was normal in size. There is mild left ventricular hypertrophy. Left ventricular diastolic parameters are indeterminate. Right Ventricle: The right ventricular size is moderately enlarged. No increase in right ventricular wall thickness. Right ventricular systolic function is severely reduced. Tricuspid regurgitation signal is inadequate for assessing PA pressure. Left Atrium: Left atrial size was normal in size. Right Atrium: Right atrial size was moderately dilated. Pericardium: There is no evidence of pericardial effusion. Mitral Valve: The mitral valve is normal in structure. Severe mitral valve regurgitation. No evidence of mitral valve stenosis. Tricuspid Valve: The tricuspid valve is normal in structure. Tricuspid valve regurgitation is severe. No evidence of tricuspid stenosis. Aortic Valve: The aortic valve is normal in structure. Aortic valve regurgitation is mild. Aortic valve sclerosis/calcification is present, without any evidence of aortic stenosis. Aortic valve mean gradient measures 2.5 mmHg. Aortic valve peak gradient measures 4.5 mmHg. Aortic valve area, by VTI measures 1.60 cm. Pulmonic Valve: The pulmonic valve was normal in structure. Pulmonic valve regurgitation is not visualized. No evidence of  pulmonic stenosis. Aorta: The aortic root is normal in size and structure. Venous: The inferior vena cava is normal in size with greater than 50% respiratory variability, suggesting right atrial pressure of 3 mmHg. IAS/Shunts: No atrial level shunt detected by color flow Doppler. Additional Comments: There is a small pleural effusion in the left lateral region.  LEFT VENTRICLE PLAX 2D LVIDd:         4.30 cm      Diastology LVIDs:         3.90 cm      LV e' medial:    6.42 cm/s LV PW:         1.20 cm      LV E/e' medial:  20.6 LV IVS:        1.30 cm      LV e' lateral:   10.10 cm/s LVOT diam:     2.00 cm      LV E/e' lateral: 13.1 LV SV:         28 LV SV Index:   17 LVOT Area:     3.14 cm  LV Volumes (MOD) LV vol d, MOD A2C: 144.0 ml LV vol d, MOD A4C: 173.0 ml LV vol s, MOD A2C: 104.0 ml LV vol s, MOD A4C: 113.0 ml LV SV MOD A2C:     40.0 ml LV SV MOD A4C:     173.0 ml LV SV MOD BP:      58.0 ml RIGHT VENTRICLE RV Basal diam:  3.80 cm RV Mid diam:    3.60 cm RV S prime:     5.87 cm/s TAPSE (M-mode): 2.1 cm LEFT ATRIUM             Index        RIGHT ATRIUM           Index LA diam:        3.90 cm 2.35 cm/m   RA Area:     31.50 cm LA Vol (A2C):   95.5 ml 57.59 ml/m  RA Volume:   116.00 ml 69.95 ml/m LA Vol (  A4C):   56.3 ml 33.95 ml/m LA Biplane Vol: 73.2 ml 44.14 ml/m  AORTIC VALVE AV Area (Vmax):    1.55 cm AV Area (Vmean):   1.37 cm AV Area (VTI):     1.60 cm AV Vmax:           105.50 cm/s AV Vmean:          72.200 cm/s AV VTI:            0.174 m AV Peak Grad:      4.5 mmHg AV Mean Grad:      2.5 mmHg LVOT Vmax:         51.90 cm/s LVOT Vmean:        31.400 cm/s LVOT VTI:          0.089 m LVOT/AV VTI ratio: 0.51  AORTA Ao Root diam: 3.07 cm MITRAL VALVE                TRICUSPID VALVE MV Area (PHT): 3.34 cm     TR Peak grad:   10.5 mmHg MV Decel Time: 227 msec     TR Vmax:        162.00 cm/s MV E velocity: 132.00 cm/s MV A velocity: 83.80 cm/s   SHUNTS MV E/A ratio:  1.58         Systemic VTI:  0.09 m                              Systemic Diam: 2.00 cm Ida Rogue MD Electronically signed by Ida Rogue MD Signature Date/Time: 08/22/2022/3:35:47 PM    Final    US RENAL  Result Date: 08/22/2022 CLINICAL DATA:  Left retroperitoneal mass seen on CT. EXAM: RENAL / URINARY TRACT ULTRASOUND COMPLETE COMPARISON:  CT abdomen pelvis from same day. CT chest dated March 20, 2020. PET-CT dated March 19, 2006. FINDINGS: Right Kidney: Renal measurements: 9.8 x 5.1 x 5.3 cm = volume: 139 mL. Increased echogenicity. No mass or hydronephrosis visualized. Left Kidney: Renal measurements: 6.5 x 3.7 x 3.3 cm = volume: 41 mL. Increased echogenicity. No mass or hydronephrosis visualized. Bladder: Appears normal for degree of bladder distention. Other: Incidentally noted left retroperitoneal mass superior and medial to the left kidney, measuring 4.0 x 3.3 x 3.7 cm. Large ascites. IMPRESSION: 1. Left retroperitoneal mass, stable in size since 2021, and decreased in size since 2007. This was biopsied in 2007, pathology consistent with ganglioneuroma. 2. No renal mass. Left renal atrophy and bilateral renal increased echogenicity, consistent with medical renal disease. 3. Unchanged large ascites. Electronically Signed   By: Titus Dubin M.D.   On: 08/22/2022 12:30   CT ABDOMEN PELVIS WO CONTRAST  Result Date: 08/22/2022 CLINICAL DATA:  Abdominal distension EXAM: CT ABDOMEN AND PELVIS WITHOUT CONTRAST TECHNIQUE: Multidetector CT imaging of the abdomen and pelvis was performed following the standard protocol without IV contrast. RADIATION DOSE REDUCTION: This exam was performed according to the departmental dose-optimization program which includes automated exposure control, adjustment of the mA and/or kV according to patient size and/or use of iterative reconstruction technique. COMPARISON:  06/04/2021 FINDINGS: Lower chest: Cardiomegaly. Small bilateral pleural effusions. Coronary artery and aortic calcifications. Hepatobiliary:  No focal hepatic abnormality. Gallbladder unremarkable. Pancreas: No focal abnormality or ductal dilatation. Spleen: No focal abnormality.  Normal size. Adrenals/Urinary Tract: Left retroperitoneal mass again noted in the region surgical clips in the left retroperitoneum adjacent to the left kidney measuring 5.5 x  3.2 cm compared to up to 5.6 cm previously. Left kidney is atrophic. No renal stones or hydronephrosis. Renal vascular calcifications noted. Urinary bladder decompressed, grossly unremarkable. Stomach/Bowel: Scattered sigmoid diverticula. No active diverticulitis. Stomach and small bowel decompressed, grossly unremarkable. No bowel obstruction. Vascular/Lymphatic: Heavily calcified aorta, iliac vessels and branch vessels. No evidence of aneurysm or adenopathy. Reproductive: Uterus and adnexa unremarkable.  No mass. Other: Large volume ascites in the abdomen and pelvis. Musculoskeletal: No acute bony abnormality. IMPRESSION: Large volume ascites in the abdomen or pelvis. Small bilateral pleural effusions. Cardiomegaly, coronary artery disease. Left retroperitoneal mass adjacent to surgical clips superior to the left kidney. This is not significantly changed. Scattered sigmoid diverticulosis. Diffuse aortoiliac and branch vessel atherosclerosis. Electronically Signed   By: Rolm Baptise M.D.   On: 08/22/2022 01:03   DG Chest 2 View  Result Date: 08/21/2022 CLINICAL DATA:  Chest pain. EXAM: CHEST - 2 VIEW COMPARISON:  Abdominal series 07/04/2021 FINDINGS: The heart is moderately enlarged, unchanged. There is no focal lung consolidation, pleural effusion or pneumothorax. No acute fractures are seen. There are atherosclerotic calcifications of the aorta. IMPRESSION: 1. Stable moderate cardiomegaly. 2. No acute cardiopulmonary process. Electronically Signed   By: Ronney Asters M.D.   On: 08/21/2022 17:42    Microbiology: Results for orders placed or performed during the hospital encounter of 06/04/21   Urine Culture     Status: Abnormal   Collection Time: 06/04/21  2:56 PM   Specimen: Urine, Random  Result Value Ref Range Status   Specimen Description   Final    URINE, RANDOM Performed at [facility], [address], [place], [place] [address]    Special Requests   Final    NONE Performed at [facility], [address] [place], [place] [address]    Culture MULTIPLE SPECIES PRESENT, SUGGEST RECOLLECTION (A)  Final   Report Status 06/06/2021 FINAL  Final  Resp Panel by RT-PCR (Flu A&B, Covid) Nasopharyngeal Swab     Status: None   Collection Time: 06/04/21  8:23 PM   Specimen: Nasopharyngeal Swab; Nasopharyngeal(NP) swabs in vial transport medium  Result Value Ref Range Status   SARS Coronavirus 2 by RT PCR NEGATIVE NEGATIVE Final    Comment: (NOTE) SARS-CoV-2 target nucleic acids are NOT DETECTED.  The SARS-CoV-2 RNA is generally detectable in upper respiratory specimens during the acute phase of infection. The lowest concentration of SARS-CoV-2 viral copies this assay can detect is 138 copies/mL. A negative result does not preclude SARS-Cov-2 infection and should not be used as the sole basis for treatment or other patient management decisions. A negative result may occur with  improper specimen collection/handling, submission of specimen other than nasopharyngeal swab, presence of viral mutation(s) within the areas targeted by this assay, and inadequate number of viral copies(<138 copies/mL). A negative result must be combined with clinical observations, patient history, and epidemiological information. The expected result is Negative.  Fact Sheet for Patients:  EntrepreneurPulse.com.au  Fact Sheet for Healthcare Providers:  IncredibleEmployment.be  This test is no t yet approved or cleared by the [place] FDA and  has been authorized for detection and/or diagnosis of SARS-CoV-2 by FDA under an Emergency  Use Authorization (EUA). This EUA will remain  in effect (meaning this test can be used) for the duration of the COVID-19 declaration under Section 564(b)(1) of the Act, 21 U.S.C.section 360bbb-3(b)(1), unless the authorization is terminated  or revoked sooner.       Influenza A by PCR  NEGATIVE NEGATIVE Final   Influenza B by PCR NEGATIVE NEGATIVE Final    Comment: (NOTE) The Xpert Xpress SARS-CoV-2/FLU/RSV plus assay is intended as an aid in the diagnosis of influenza from Nasopharyngeal swab specimens and should not be used as a sole basis for treatment. Nasal washings and aspirates are unacceptable for Xpert Xpress SARS-CoV-2/FLU/RSV testing.  Fact Sheet for Patients: EntrepreneurPulse.com.au  Fact Sheet for Healthcare Providers: IncredibleEmployment.be  This test is not yet approved or cleared by the [place] FDA and has been authorized for detection and/or diagnosis of SARS-CoV-2 by FDA under an Emergency Use Authorization (EUA). This EUA will remain in effect (meaning this test can be used) for the duration of the COVID-19 declaration under Section 564(b)(1) of the Act, 21 U.S.C. section 360bbb-3(b)(1), unless the authorization is terminated or revoked.  Performed at [facility], [address] [place], [place] [address]     Labs: CBC: Recent Labs  Lab 08/28/22 0423  WBC 7.4  HGB 11.3*  HCT 37.5  MCV 89.7  PLT 945   Basic Metabolic Panel: Recent Labs  Lab 08/27/22 0641 08/28/22 0423 08/29/22 0624 08/30/22 0810 08/31/22 0559  NA 134* 137 137 136 137  K 3.1* 3.6 3.0* 3.1* 3.4*  CL 96* 96* 94* 93* 92*  CO2 28 30 33* 32 34*  GLUCOSE 80 90 82 84 92  BUN 46* 41* 33* 31* 28*  CREATININE 1.76* 1.62* 1.45* 1.50* 1.64*  CALCIUM 8.4* 8.8* 8.5* 8.4* 8.6*   Liver Function Tests: Recent Labs  Lab 08/25/22 0626 08/26/22 0427 08/28/22 0423  AST 120* 76* 44*  ALT 253* 197* 121*  ALKPHOS 112 113 100   BILITOT 2.1* 1.7* 2.1*  PROT 7.0 7.3 7.0  ALBUMIN 3.4* 3.5 3.4*   CBG: No results for input(s): "GLUCAP" in the last 168 hours.  Discharge time spent: less than 30 minutes.  Signed: Annita Brod, MD [facility] 08/31/2022

## 2022-09-01 DIAGNOSIS — I5043 Acute on chronic combined systolic (congestive) and diastolic (congestive) heart failure: Secondary | ICD-10-CM | POA: Diagnosis not present

## 2022-09-01 LAB — BASIC METABOLIC PANEL
Anion gap: 10 (ref 5–15)
BUN: 32 mg/dL — ABNORMAL HIGH (ref 6–20)
CO2: 33 mmol/L — ABNORMAL HIGH (ref 22–32)
Calcium: 9 mg/dL (ref 8.9–10.3)
Chloride: 93 mmol/L — ABNORMAL LOW (ref 98–111)
Creatinine, Ser: 1.63 mg/dL — ABNORMAL HIGH (ref 0.44–1.00)
GFR, Estimated: 36 mL/min — ABNORMAL LOW (ref >60)
Glucose, Bld: 91 mg/dL (ref 70–99)
Potassium: 5 mmol/L (ref 3.5–5.1)
Sodium: 136 mmol/L (ref 135–145)

## 2022-09-01 NOTE — Care Management Important Message (Signed)
Important Message  Patient Details  Name: Anita [name] MRN: [id number] Date of Birth: 07/05/1961   Medicare Important Message Given:  Yes     Dannette Barbara 09/01/2022, 11:24 AM

## 2022-09-01 NOTE — Plan of Care (Signed)

## 2022-09-01 NOTE — Discharge Summary (Signed)
Physician Discharge Summary   Patient: Anita [name] MRN: [id number] DOB: 1961/10/24  Admit date:     08/21/2022  Anticipated discharge date: 09/01/22  Discharge Physician: Emeterio Reeve   PCP: [name], NP   Recommendations at discharge:   Patient being discharged with home health PT and OT (she declined SNF)  Discharge Diagnoses: Active Problems:   Acute on chronic combined systolic and diastolic CHF (congestive heart failure) (HCC)   Anasarca   Acute kidney injury superimposed on stage IIIa chronic kidney disease (HCC)   COPD (chronic obstructive pulmonary disease) (HCC)   Essential hypertension   Elevated troponin   Transaminitis   Multiple sclerosis (HCC)   Coronary artery disease involving native coronary artery of native heart without angina pectoris   Gastroesophageal reflux disease   IDA (iron deficiency anemia)   Tobacco abuse   Acute on chronic HFrEF (heart failure with reduced ejection fraction) (HCC)   Hypokalemia   Hypervolemia   Cardiorenal syndrome  Resolved Problems:   * No resolved hospital problems. *  Hospital Course: 61 year old female with past medical history of multiple sclerosis and systolic/diastolic heart failure with moderate to severe tricuspid regurg and severe mitral regurg presented to the emergency room on 9/14 with complaints of increased facial and abdominal swelling for the past few weeks.  Patient is on diuretics, but has not been taking them as of late.  In the emergency room, patient found to have acute kidney injury with a creatinine of 2.73, BNP of greater than 4500, troponin of 600 and she was given a dose of Lasix in the emergency room.  Cardiology and nephrology consulted.  Following suboptimal response to doses of IV Lasix, patient changed over to Lasix drip to treat volume overload driven by biventricular failure. Per cardiology, unable to add ACE/ARB/ARNI/aldosterone antagonist d/t renal function, and she is not a  candidate for repair of valvular disease - cardiac treatment options limited. Palliative care following.   By 9/20, nephrology had converted patient from Lasix drip over to oral Demadex.  Patient continues to have good urine output and improvement in renal function.  By 9/24, felt to be fully diuresed and Demadex briefly stopped due to overdiuresis. Home health confirmed and pt appropriate for discharge 09/25, spoke w/ daughter this morning and no further concerns.   Assessment and Plan: Acute on chronic combined systolic and diastolic CHF (congestive heart failure) [facility]) Cardiology following.  Echocardiogram done 9/15 notes ejection fraction of 30 to 35% with severe tricuspid and mitral valve regurg.  Global hypokinesis.  Indeterminate diastolic function.  This looks to be worse than previous echocardiogram done 15 months ago.  After minimal diuresis, pt was placed on Lasix drip on 9/17 as per nephrology recommendations.  By 9/20, drip discontinued and patient started on oral Demadex.  To date, patient has diuresed over 8L and is almost -7.5 L deficient.  Has been on oral Demadex and by 9/24, felt to be fully diuresed and okay for discharge.   Please note that as per cardiology's evaluations, these are limited short-term interventions, as long-term, patient has limited options as she is not a can for valve repair/replacement and ARB/ACE inhibitor not felt to be indicated given her renal function but may be able to add this if renal function improves / BP allows. Close follow up with cardiology   Anasarca Secondary to CHF.  Right ventricular failure causing transaminitis and overall hepatic congestion.  As above.  Improving.  Acute kidney injury superimposed on stage  IIIa chronic kidney disease (HCC) --> possible stage IIIb at this point  Secondary to decompensated heart failure.  Baseline creatinine around 1.13.  Nephrology following.  No indication for dialysis.  Creatinine down to as low as 1.45  although did trend up slightly after.  We will recheck in the morning, have stopped Demadex for mild overdiuresis.  This is likely her new normal at stage IIIb.  COPD (chronic obstructive pulmonary disease) (HCC) Stable, denies any shortness of breath  Essential hypertension Elevated diastolic blood pressures in part due to right ventricular failure.  Numbers improved with diuresis. Consider ACE/ARB/ARNI/aldosterone antagonist per renal function/nephrology and per cardiology  Transaminitis Secondary to hepatic congestion from heart failure.  Continues to improve as she diureses. Can follow outpatient.   Elevated troponin Demand ischemia versus elevations due to renal failure and heart failure.  Cardiology following.  Cycle as needed.  Patient denies any chest pain at this time.  Multiple sclerosis ([place]) Stable at this time.  Not on any medications.  Coronary artery disease involving native coronary artery of native heart without angina pectoris Cardiology following. At this point no apparent plans for ischemic workup.   IDA (iron deficiency anemia) Monitor periodic CBC, follow outpatient   Tobacco abuse Have provided counseling. Nicotine patch.   Hypokalemia Likely d/t lasix, will replete and continue to monitor BMP         Consultants: Cardiology Nephrology Palliative care   Procedures: Echocardiogram noting worsening ejection fraction of 30 to 35%, severe mitral and tricuspid regurg, severe pulmonary hypertension and indeterminate diastolic function  Disposition: Home with home health Diet recommendation:  Discharge Diet Orders (From admission, onward)     Start     Ordered   08/31/22 0000  Diet - low sodium heart healthy        08/31/22 1347           Cardiac diet DISCHARGE MEDICATION: Allergies as of 09/01/2022       Reactions   Acthar Hp [corticotropin] Other (See Comments)   Throat swelling and coughing up blood   Codeine    Prednisone          Medication List     TAKE these medications    Advair Diskus 250-50 MCG/ACT Aepb Generic drug: fluticasone-salmeterol INHALE 1 PUFF INTO THE LUNGS TWICE A DAY   albuterol 108 (90 Base) MCG/ACT inhaler Commonly known as: VENTOLIN HFA INHALE 1-2 PUFFS INTO THE LUNGS EVERY 6 (SIX) HOURS AS NEEDED FOR WHEEZING OR SHORTNESS OF BREATH. USE SPARINGLY!   cetirizine 10 MG tablet Commonly known as: ZYRTEC Take 10 mg by mouth daily.   erythromycin ophthalmic ointment SMARTSIG:1 sparingly In Eye(s) 3 Times Daily   feeding supplement Liqd Take 237 mLs by mouth 2 (two) times daily between meals.   ferrous sulfate 325 (65 FE) MG EC tablet Take 1 tablet (325 mg total) by mouth 2 (two) times daily with a meal.   Flintstones Multivitamin Chew Chew by mouth.   guaiFENesin 600 MG 12 hr tablet Commonly known as: MUCINEX Take 600 mg by mouth 2 (two) times daily as needed for cough.   metoprolol succinate 25 MG 24 hr tablet Commonly known as: TOPROL-XL Take 1 tablet (25 mg total) by mouth daily. Take with or immediately following a meal.   nitroGLYCERIN 0.4 MG SL tablet Commonly known as: NITROSTAT Place 1 tablet (0.4 mg total) under the tongue every 5 (five) minutes as needed for chest pain.   omeprazole 40 MG capsule Commonly known  as: PRILOSEC Take 1 capsule (40 mg total) by mouth daily.   Probiotic Advanced Caps Take 1 capsule by mouth daily.   SLEEP AID PO Take 1 tablet by mouth at bedtime as needed (sleep).   torsemide 20 MG tablet Commonly known as: Demadex Take for weight gain of 2 lbs in 1 day or 5 lbs in 1 week or obvious swelling.   trolamine salicylate 10 % cream Commonly known as: ASPERCREME Apply 1 application. topically daily as needed for muscle pain (back or joint pain).   vitamin B-12 500 MCG tablet Commonly known as: CYANOCOBALAMIN Take 1,000 mcg by mouth daily. 4 times weekly no more then 2000 MCG per week        Follow-up Information     Loel Dubonnet, NP Follow up on 09/05/2022.   Specialty: Cardiology Why: 2:20 PM Contact information: [place] [address] [phone]                Discharge Exam: Filed Weights   08/28/22 0426 08/29/22 0500 08/31/22 0500  Weight: 58.3 kg 55.7 kg 52.7 kg   General: Alert and oriented x2, no acute distress, frail  Cardiovascular: Regular rate and rhythm, S1-S2, 2 out of 6 systolic ejection murmur, no LE edema  Lungs: Clear to auscultation bilaterally  Condition at discharge: fair  The results of significant diagnostics from this hospitalization (including imaging, microbiology, ancillary and laboratory) are listed below for reference.   Imaging Studies: ECHOCARDIOGRAM COMPLETE  Result Date: 08/22/2022    ECHOCARDIOGRAM REPORT   Patient Name:   Anita [name] Date of Exam: 08/22/2022 Medical Rec #:  [id number]             Height:       63.0 in Accession #:    [id number]            Weight:       139.3 lb Date of Birth:  February 12, 1961             BSA:          1.658 m Patient Age:    58 years              BP:           134/89 mmHg Patient Gender: F                     HR:           85 bpm. Exam Location:  [facility] Procedure: 2D Echo, Color Doppler and Cardiac Doppler Indications:     Dyspnea R06.00  History:         Patient has prior history of Echocardiogram examinations, most                  recent 02/07/2021. CHF; COPD. Severe Mitral regurgitation.  Sonographer:     Sherrie Sport Referring Phys:  Tarri Glenn Diagnosing Phys: Ida Rogue MD  Sonographer Comments: Technically challenging study due to limited acoustic windows, no subcostal window and suboptimal apical window. IMPRESSIONS  1. Left ventricular ejection fraction, by estimation, is 30 to 35%. The left ventricle has moderately decreased function. The left ventricle demonstrates global hypokinesis. There is mild left ventricular hypertrophy. Left ventricular diastolic parameters are indeterminate.  2. Right  ventricular systolic function is severely reduced. The right ventricular size is moderately enlarged. Tricuspid regurgitation signal is inadequate for assessing PA pressure.  3. Right atrial size was moderately dilated.  4. The  mitral valve is normal in structure. Severe mitral valve regurgitation. No evidence of mitral stenosis.  5. Tricuspid valve regurgitation is severe.  6. The aortic valve is normal in structure. Aortic valve regurgitation is mild. Aortic valve sclerosis/calcification is present, without any evidence of aortic stenosis.  7. The inferior vena cava is normal in size with greater than 50% respiratory variability, suggesting right atrial pressure of 3 mmHg. FINDINGS  Left Ventricle: Left ventricular ejection fraction, by estimation, is 30 to 35%. The left ventricle has moderately decreased function. The left ventricle demonstrates global hypokinesis. The left ventricular internal cavity size was normal in size. There is mild left ventricular hypertrophy. Left ventricular diastolic parameters are indeterminate. Right Ventricle: The right ventricular size is moderately enlarged. No increase in right ventricular wall thickness. Right ventricular systolic function is severely reduced. Tricuspid regurgitation signal is inadequate for assessing PA pressure. Left Atrium: Left atrial size was normal in size. Right Atrium: Right atrial size was moderately dilated. Pericardium: There is no evidence of pericardial effusion. Mitral Valve: The mitral valve is normal in structure. Severe mitral valve regurgitation. No evidence of mitral valve stenosis. Tricuspid Valve: The tricuspid valve is normal in structure. Tricuspid valve regurgitation is severe. No evidence of tricuspid stenosis. Aortic Valve: The aortic valve is normal in structure. Aortic valve regurgitation is mild. Aortic valve sclerosis/calcification is present, without any evidence of aortic stenosis. Aortic valve mean gradient measures 2.5 mmHg.  Aortic valve peak gradient measures 4.5 mmHg. Aortic valve area, by VTI measures 1.60 cm. Pulmonic Valve: The pulmonic valve was normal in structure. Pulmonic valve regurgitation is not visualized. No evidence of pulmonic stenosis. Aorta: The aortic root is normal in size and structure. Venous: The inferior vena cava is normal in size with greater than 50% respiratory variability, suggesting right atrial pressure of 3 mmHg. IAS/Shunts: No atrial level shunt detected by color flow Doppler. Additional Comments: There is a small pleural effusion in the left lateral region.  LEFT VENTRICLE PLAX 2D LVIDd:         4.30 cm      Diastology LVIDs:         3.90 cm      LV e' medial:    6.42 cm/s LV PW:         1.20 cm      LV E/e' medial:  20.6 LV IVS:        1.30 cm      LV e' lateral:   10.10 cm/s LVOT diam:     2.00 cm      LV E/e' lateral: 13.1 LV SV:         28 LV SV Index:   17 LVOT Area:     3.14 cm  LV Volumes (MOD) LV vol d, MOD A2C: 144.0 ml LV vol d, MOD A4C: 173.0 ml LV vol s, MOD A2C: 104.0 ml LV vol s, MOD A4C: 113.0 ml LV SV MOD A2C:     40.0 ml LV SV MOD A4C:     173.0 ml LV SV MOD BP:      58.0 ml RIGHT VENTRICLE RV Basal diam:  3.80 cm RV Mid diam:    3.60 cm RV S prime:     5.87 cm/s TAPSE (M-mode): 2.1 cm LEFT ATRIUM             Index        RIGHT ATRIUM           Index LA diam:  3.90 cm 2.35 cm/m   RA Area:     31.50 cm LA Vol (A2C):   95.5 ml 57.59 ml/m  RA Volume:   116.00 ml 69.95 ml/m LA Vol (A4C):   56.3 ml 33.95 ml/m LA Biplane Vol: 73.2 ml 44.14 ml/m  AORTIC VALVE AV Area (Vmax):    1.55 cm AV Area (Vmean):   1.37 cm AV Area (VTI):     1.60 cm AV Vmax:           105.50 cm/s AV Vmean:          72.200 cm/s AV VTI:            0.174 m AV Peak Grad:      4.5 mmHg AV Mean Grad:      2.5 mmHg LVOT Vmax:         51.90 cm/s LVOT Vmean:        31.400 cm/s LVOT VTI:          0.089 m LVOT/AV VTI ratio: 0.51  AORTA Ao Root diam: 3.07 cm MITRAL VALVE                TRICUSPID VALVE MV Area (PHT):  3.34 cm     TR Peak grad:   10.5 mmHg MV Decel Time: 227 msec     TR Vmax:        162.00 cm/s MV E velocity: 132.00 cm/s MV A velocity: 83.80 cm/s   SHUNTS MV E/A ratio:  1.58         Systemic VTI:  0.09 m                             Systemic Diam: 2.00 cm Ida Rogue MD Electronically signed by Ida Rogue MD Signature Date/Time: 08/22/2022/3:35:47 PM    Final    US RENAL  Result Date: 08/22/2022 CLINICAL DATA:  Left retroperitoneal mass seen on CT. EXAM: RENAL / URINARY TRACT ULTRASOUND COMPLETE COMPARISON:  CT abdomen pelvis from same day. CT chest dated March 20, 2020. PET-CT dated March 19, 2006. FINDINGS: Right Kidney: Renal measurements: 9.8 x 5.1 x 5.3 cm = volume: 139 mL. Increased echogenicity. No mass or hydronephrosis visualized. Left Kidney: Renal measurements: 6.5 x 3.7 x 3.3 cm = volume: 41 mL. Increased echogenicity. No mass or hydronephrosis visualized. Bladder: Appears normal for degree of bladder distention. Other: Incidentally noted left retroperitoneal mass superior and medial to the left kidney, measuring 4.0 x 3.3 x 3.7 cm. Large ascites. IMPRESSION: 1. Left retroperitoneal mass, stable in size since 2021, and decreased in size since 2007. This was biopsied in 2007, pathology consistent with ganglioneuroma. 2. No renal mass. Left renal atrophy and bilateral renal increased echogenicity, consistent with medical renal disease. 3. Unchanged large ascites. Electronically Signed   By: Titus Dubin M.D.   On: 08/22/2022 12:30   CT ABDOMEN PELVIS WO CONTRAST  Result Date: 08/22/2022 CLINICAL DATA:  Abdominal distension EXAM: CT ABDOMEN AND PELVIS WITHOUT CONTRAST TECHNIQUE: Multidetector CT imaging of the abdomen and pelvis was performed following the standard protocol without IV contrast. RADIATION DOSE REDUCTION: This exam was performed according to the departmental dose-optimization program which includes automated exposure control, adjustment of the mA and/or kV according to  patient size and/or use of iterative reconstruction technique. COMPARISON:  06/04/2021 FINDINGS: Lower chest: Cardiomegaly. Small bilateral pleural effusions. Coronary artery and aortic calcifications. Hepatobiliary: No focal hepatic abnormality. Gallbladder unremarkable. Pancreas: No focal abnormality  or ductal dilatation. Spleen: No focal abnormality.  Normal size. Adrenals/Urinary Tract: Left retroperitoneal mass again noted in the region surgical clips in the left retroperitoneum adjacent to the left kidney measuring 5.5 x 3.2 cm compared to up to 5.6 cm previously. Left kidney is atrophic. No renal stones or hydronephrosis. Renal vascular calcifications noted. Urinary bladder decompressed, grossly unremarkable. Stomach/Bowel: Scattered sigmoid diverticula. No active diverticulitis. Stomach and small bowel decompressed, grossly unremarkable. No bowel obstruction. Vascular/Lymphatic: Heavily calcified aorta, iliac vessels and branch vessels. No evidence of aneurysm or adenopathy. Reproductive: Uterus and adnexa unremarkable.  No mass. Other: Large volume ascites in the abdomen and pelvis. Musculoskeletal: No acute bony abnormality. IMPRESSION: Large volume ascites in the abdomen or pelvis. Small bilateral pleural effusions. Cardiomegaly, coronary artery disease. Left retroperitoneal mass adjacent to surgical clips superior to the left kidney. This is not significantly changed. Scattered sigmoid diverticulosis. Diffuse aortoiliac and branch vessel atherosclerosis. Electronically Signed   By: Rolm Baptise M.D.   On: 08/22/2022 01:03   DG Chest 2 View  Result Date: 08/21/2022 CLINICAL DATA:  Chest pain. EXAM: CHEST - 2 VIEW COMPARISON:  Abdominal series 07/04/2021 FINDINGS: The heart is moderately enlarged, unchanged. There is no focal lung consolidation, pleural effusion or pneumothorax. No acute fractures are seen. There are atherosclerotic calcifications of the aorta. IMPRESSION: 1. Stable moderate  cardiomegaly. 2. No acute cardiopulmonary process. Electronically Signed   By: Ronney Asters M.D.   On: 08/21/2022 17:42    Microbiology: Results for orders placed or performed during the hospital encounter of 06/04/21  Urine Culture     Status: Abnormal   Collection Time: 06/04/21  2:56 PM   Specimen: Urine, Random  Result Value Ref Range Status   Specimen Description   Final    URINE, RANDOM Performed at [facility], [address], [place], [place] [address]    Special Requests   Final    NONE Performed at [facility], [address] [place], [place] [address]    Culture MULTIPLE SPECIES PRESENT, SUGGEST RECOLLECTION (A)  Final   Report Status 06/06/2021 FINAL  Final  Resp Panel by RT-PCR (Flu A&B, Covid) Nasopharyngeal Swab     Status: None   Collection Time: 06/04/21  8:23 PM   Specimen: Nasopharyngeal Swab; Nasopharyngeal(NP) swabs in vial transport medium  Result Value Ref Range Status   SARS Coronavirus 2 by RT PCR NEGATIVE NEGATIVE Final    Comment: (NOTE) SARS-CoV-2 target nucleic acids are NOT DETECTED.  The SARS-CoV-2 RNA is generally detectable in upper respiratory specimens during the acute phase of infection. The lowest concentration of SARS-CoV-2 viral copies this assay can detect is 138 copies/mL. A negative result does not preclude SARS-Cov-2 infection and should not be used as the sole basis for treatment or other patient management decisions. A negative result may occur with  improper specimen collection/handling, submission of specimen other than nasopharyngeal swab, presence of viral mutation(s) within the areas targeted by this assay, and inadequate number of viral copies(<138 copies/mL). A negative result must be combined with clinical observations, patient history, and epidemiological information. The expected result is Negative.  Fact Sheet for Patients:  EntrepreneurPulse.com.au  Fact Sheet for Healthcare  Providers:  IncredibleEmployment.be  This test is no t yet approved or cleared by the [place] FDA and  has been authorized for detection and/or diagnosis of SARS-CoV-2 by FDA under an Emergency Use Authorization (EUA). This EUA will remain  in effect (meaning this test can be used) for  the duration of the COVID-19 declaration under Section 564(b)(1) of the Act, 21 U.S.C.section 360bbb-3(b)(1), unless the authorization is terminated  or revoked sooner.       Influenza A by PCR NEGATIVE NEGATIVE Final   Influenza B by PCR NEGATIVE NEGATIVE Final    Comment: (NOTE) The Xpert Xpress SARS-CoV-2/FLU/RSV plus assay is intended as an aid in the diagnosis of influenza from Nasopharyngeal swab specimens and should not be used as a sole basis for treatment. Nasal washings and aspirates are unacceptable for Xpert Xpress SARS-CoV-2/FLU/RSV testing.  Fact Sheet for Patients: EntrepreneurPulse.com.au  Fact Sheet for Healthcare Providers: IncredibleEmployment.be  This test is not yet approved or cleared by the [place] FDA and has been authorized for detection and/or diagnosis of SARS-CoV-2 by FDA under an Emergency Use Authorization (EUA). This EUA will remain in effect (meaning this test can be used) for the duration of the COVID-19 declaration under Section 564(b)(1) of the Act, 21 U.S.C. section 360bbb-3(b)(1), unless the authorization is terminated or revoked.  Performed at [facility], [address] [place], [place] [address]     Labs: CBC: Recent Labs  Lab 08/28/22 0423  WBC 7.4  HGB 11.3*  HCT 37.5  MCV 89.7  PLT 371   Basic Metabolic Panel: Recent Labs  Lab 08/28/22 0423 08/29/22 0624 08/30/22 0810 08/31/22 0559 09/01/22 0551  NA 137 137 136 137 136  K 3.6 3.0* 3.1* 3.4* 5.0  CL 96* 94* 93* 92* 93*  CO2 30 33* 32 34* 33*  GLUCOSE 90 82 84 92 91  BUN 41* 33* 31* 28* 32*  CREATININE  1.62* 1.45* 1.50* 1.64* 1.63*  CALCIUM 8.8* 8.5* 8.4* 8.6* 9.0   Liver Function Tests: Recent Labs  Lab 08/26/22 0427 08/28/22 0423  AST 76* 44*  ALT 197* 121*  ALKPHOS 113 100  BILITOT 1.7* 2.1*  PROT 7.3 7.0  ALBUMIN 3.5 3.4*   CBG: No results for input(s): "GLUCAP" in the last 168 hours.  Discharge time spent: less than 30 minutes.  Signed: Emeterio Reeve, DO [facility] 09/01/2022

## 2022-09-01 NOTE — Progress Notes (Signed)
PT Cancellation Note  Patient Details Name: Anita [name] MRN: [id number] DOB: 1961/02/07   Cancelled Treatment:    Reason Eval/Treat Not Completed: Other (comment)  Offered session this am.  Pt again refused all interventions and education regarding importance of mobility for pain.  She stated she has sat EOB on her own but has no interest in anything further at this time.  Chesley Noon 09/01/2022, 9:25 AM

## 2022-09-02 ENCOUNTER — Telehealth: Payer: Self-pay | Admitting: *Deleted

## 2022-09-02 ENCOUNTER — Ambulatory Visit: Payer: Medicare Other | Admitting: Family

## 2022-09-02 DIAGNOSIS — I272 Pulmonary hypertension, unspecified: Secondary | ICD-10-CM | POA: Diagnosis not present

## 2022-09-02 DIAGNOSIS — I130 Hypertensive heart and chronic kidney disease with heart failure and stage 1 through stage 4 chronic kidney disease, or unspecified chronic kidney disease: Secondary | ICD-10-CM | POA: Diagnosis not present

## 2022-09-02 DIAGNOSIS — I5043 Acute on chronic combined systolic (congestive) and diastolic (congestive) heart failure: Secondary | ICD-10-CM | POA: Diagnosis not present

## 2022-09-02 DIAGNOSIS — G35 Multiple sclerosis: Secondary | ICD-10-CM | POA: Diagnosis not present

## 2022-09-02 DIAGNOSIS — N1831 Chronic kidney disease, stage 3a: Secondary | ICD-10-CM | POA: Diagnosis not present

## 2022-09-02 DIAGNOSIS — E876 Hypokalemia: Secondary | ICD-10-CM | POA: Diagnosis not present

## 2022-09-02 DIAGNOSIS — I081 Rheumatic disorders of both mitral and tricuspid valves: Secondary | ICD-10-CM | POA: Diagnosis not present

## 2022-09-02 DIAGNOSIS — Z720 Tobacco use: Secondary | ICD-10-CM | POA: Diagnosis not present

## 2022-09-02 DIAGNOSIS — J449 Chronic obstructive pulmonary disease, unspecified: Secondary | ICD-10-CM | POA: Diagnosis not present

## 2022-09-02 DIAGNOSIS — D509 Iron deficiency anemia, unspecified: Secondary | ICD-10-CM | POA: Diagnosis not present

## 2022-09-02 DIAGNOSIS — I2510 Atherosclerotic heart disease of native coronary artery without angina pectoris: Secondary | ICD-10-CM | POA: Diagnosis not present

## 2022-09-02 DIAGNOSIS — N179 Acute kidney failure, unspecified: Secondary | ICD-10-CM | POA: Diagnosis not present

## 2022-09-02 NOTE — Patient Outreach (Signed)
  Care Coordination [facility] Note Transition Care Management Follow-up Telephone Call Date of discharge and from where: [facility] [phone] How have you been since you were released from the hospital? Overall ok have already gotten up and done her bath Any questions or concerns? No  Items Reviewed: Did the pt receive and understand the discharge instructions provided? Yes  Medications obtained and verified? Yes  Other? No  Any new allergies since your discharge? No  Dietary orders reviewed? Yes Do you have support at home? Yes   Home Care and Equipment/Supplies: Were home health services ordered? yes If so, what is the name of the agency? [facility] Has the agency set up a time to come to the patient's home? yes Were any new equipment or medical supplies ordered?  No What is the name of the medical supply agency? N  Were you able to get the supplies/equipment? not applicable Do you have any questions related to the use of the equipment or supplies? No  Functional Questionnaire: (I = Independent and D = Dependent) ADLs: I  Bathing/Dressing- I  Meal Prep- D  Eating- I  Maintaining continence- I  Transferring/Ambulation- I  Managing Meds- I  Follow up appointments reviewed:  PCP Hospital f/u appt confirmed? No  Patient daughter refused RN to help set appointment. She wanted to set the appointments herself with schedule.  [facility] f/u appt confirmed? Yes  Scheduled to see [name] 09/08/2022 2:30 and Reece Levy 09/19/2022 3:35 Are transportation arrangements needed? No  If their condition worsens, is the pt aware to call PCP or go to the Emergency Dept.? Yes Was the patient provided with contact information for the PCP's office or ED? Yes Was to pt encouraged to call back with questions or concerns? Yes  SDOH assessments and interventions completed:   Yes  Care Coordination Interventions Activated:  Yes   Care Coordination Interventions:  No  Care Coordination interventions needed at this time.   Encounter Outcome:  Pt. Visit Completed    [facility] [phone]

## 2022-09-03 ENCOUNTER — Other Ambulatory Visit: Payer: Medicare Other | Admitting: Nurse Practitioner

## 2022-09-04 ENCOUNTER — Telehealth: Payer: Self-pay

## 2022-09-04 DIAGNOSIS — J449 Chronic obstructive pulmonary disease, unspecified: Secondary | ICD-10-CM | POA: Diagnosis not present

## 2022-09-04 DIAGNOSIS — I130 Hypertensive heart and chronic kidney disease with heart failure and stage 1 through stage 4 chronic kidney disease, or unspecified chronic kidney disease: Secondary | ICD-10-CM | POA: Diagnosis not present

## 2022-09-04 DIAGNOSIS — N179 Acute kidney failure, unspecified: Secondary | ICD-10-CM | POA: Diagnosis not present

## 2022-09-04 DIAGNOSIS — N1831 Chronic kidney disease, stage 3a: Secondary | ICD-10-CM | POA: Diagnosis not present

## 2022-09-04 DIAGNOSIS — I272 Pulmonary hypertension, unspecified: Secondary | ICD-10-CM | POA: Diagnosis not present

## 2022-09-04 DIAGNOSIS — I5043 Acute on chronic combined systolic (congestive) and diastolic (congestive) heart failure: Secondary | ICD-10-CM | POA: Diagnosis not present

## 2022-09-04 NOTE — Telephone Encounter (Signed)
[facility] - Client TELEPHONE ADVICE RECORD [facility] Patient Name: Anita [name] Gender: Female DOB: 01/18/1961 Age: 61 Y 20 M 14 D Return Phone Number: [phone] (Primary) Address: City/ State/ Zip: [place]  [address] Client [facility] - Client Client Site [facility] - Day Provider Alma Friendly - NP Contact Type Call Who Is Calling Patient / Member / Family / Caregiver Call Type Triage / Clinical Caller Name Jenny Reichmann Relationship To Patient Care Giver Return Phone Number [phone] (Primary) Chief Complaint Heart palpitations or irregular heartbeat Reason for Call Symptomatic / Request for Health Information Initial Comment Heart rate 136 Translation No Nurse Assessment Nurse: Raenette Rover, RN, Zella Ball Date/Time Eilene Ghazi Time): 09/04/2022 12:06:11 PM Confirm and document reason for call. If symptomatic, describe symptoms. ---Concerns about HR 130's without exercise. Does the patient have any new or worsening symptoms? ---Yes Will a triage be completed? ---Yes Related visit to physician within the last 2 weeks? ---No Does the PT have any chronic conditions? (i.e. diabetes, asthma, this includes High risk factors for pregnancy, etc.) ---Yes List chronic conditions. ---chf, MS Is this a behavioral health or substance abuse call? ---No Guidelines Guideline Title Affirmed Question Affirmed Notes Nurse Date/Time Eilene Ghazi Time) Chest Pain [1] Chest pain lasts > 5 minutes AND [2] age > 47 Raenette Rover, RN, Zella Ball 09/04/2022 12:08:51 PM Disp. Time Eilene Ghazi Time) Disposition Final User 09/04/2022 11:51:09 AM Attempt made - message left Lahoma Crocker 09/04/2022 12:11:15 PM Call EMS 911 Now Yes Raenette Rover, RN, Zella Ball Final Disposition 09/04/2022 12:11:15 PM Call EMS 911 Now Yes Raenette Rover, RN, Zella Ball PLEASE NOTE: All timestamps contained within this report are represented as [place] Standard  Time. CONFIDENTIALTY NOTICE: This fax transmission is intended only for the addressee. It contains information that is legally privileged, confidential or otherwise protected from use or disclosure. If you are not the intended recipient, you are strictly prohibited from reviewing, disclosing, copying using or disseminating any of this information or taking any action in reliance on or regarding this information. If you have received this fax in error, please notify us immediately by telephone so that we can arrange for its return to [place]. Phone: [phone], Toll-Free: [phone], Fax: [phone] Page: 2 of 2 Call Id: [id number] [place] Disagree/Comply Disagree Caller Understands Yes PreDisposition Did not know what to do Care Advice Given Per Guideline CALL EMS 911 NOW: CARE ADVICE given per Chest Pain (Adult) guideline. Comments User: Wilson Singer, RN Date/Time Eilene Ghazi Time): 09/04/2022 20:25:42 PM was in the hospital for fluid retention. CHF. Fluid retention was in the belly at hospitalization and not extra swelling today. User: Wilson Singer, RN Date/Time Eilene Ghazi Time): 09/04/2022 70:62:37 PM htr [id number] User: Wilson Singer, RN Date/Time Eilene Ghazi Time): 09/04/2022 12:10:27 PM bp 132/68 hr 150's User: Wilson Singer, RN Date/Time (Eastern Time): 09/04/2022 12:16:04 PM Caller states the patient is shaking her head no that she does not want to go back to the hospital. She said she just gave her morning meds and wants to give them an hour to work and if hr doesnt come down plans to go to the ER. Caller states her med was Metprolol 25 mg ER, Advised that doesn't work immediately and is an extended release and is released all day. Advised she needs to be see right away and call and ambulance. Caller declined. User: Wilson Singer, RN Date/Time Eilene Ghazi Time): 09/04/2022 12:18:11 PM Callre states she doesnt want to speak to the office right now. Plans to  call back later to schedule post  hospital visit. User: Wilson Singer, RN Date/Time Eilene Ghazi Time): 09/04/2022 12:20:06 PM Attempted to call back line to report caller declined triage outcome and office numbers not working. Will ring and go to busy signals. Referrals GO TO FACILITY REFUSE

## 2022-09-04 NOTE — Telephone Encounter (Signed)
Noted and agree with assessment for evaluation soon. Looks like cardiology visit was changed to 09/08/22.  I need to see patient for follow up within the next few weeks. Please have daughter schedule.

## 2022-09-04 NOTE — Telephone Encounter (Signed)
Noted  

## 2022-09-04 NOTE — Telephone Encounter (Signed)
Called and spoke with patients daughter. Heart rate has come down to 91 bpm.  Confirmed seeing cardiology 10/02.  Follow up scheduled 10/10.

## 2022-09-04 NOTE — Telephone Encounter (Signed)
I spoke with pts daughter [name] [name] signed) pt had heart rate earlier 130's and then went to 155 - 164 while speaking with access nurse. Pt did not want to go to ED. Pt took metoprolol 25 mg about 11:30. Now pt P 94 but pt is laying down and has been resting for awhile. Pt has soreness in chest and SOB that is not uncommon pt  usually has some soreness in chest and SOB. No H/A or dizziness. Pt has wrist BP cuff that [name] is not sure it works. Pt was admitted to [facility] 08/22/22 -9/-25/23 with elevated troponin, CHF and pt was to FU with Laurann Montana NP on 09/05/22. [name] said that the 09/05/22 appt with cardiology interferred with an appt [name] had that she cannot miss. Pt now scheduled with card on 09/19/22 and has appt with CHF clinic on 09/08/22.[name] said that pt refused to go to ED earlier. [name] will call cardiology and give update of what occurred this morning.UC & ED precautions given and [name] voiced understanding. Sending note to Gentry Fitz NP and Hedy Camara and will teams [place] CMA.

## 2022-09-05 ENCOUNTER — Ambulatory Visit (HOSPITAL_BASED_OUTPATIENT_CLINIC_OR_DEPARTMENT_OTHER): Payer: Medicare Other | Admitting: Family

## 2022-09-07 NOTE — Progress Notes (Unsigned)
Patient ID: Anita [name], female    DOB: 1961-11-22, 61 y.o.   MRN: [id number]  HPI  [name] is a 61 y/o female with a history of CAD, COPD, GERD, multiple sclerosis, pulmonary HTN, severe valvular disease, previous tobacco use and chronic heart failure.   Echo report from 08/22/22 reviewed and showed an EF of 30-35% along with mild LVH and severe MR/TR. Echo report from 02/07/21 reviewed and showed an EF of 40-45% along with severely elevated PA pressure of 69.2 mmHg, moderate LAE, severe MR, mild/ moderate MS and moderate/severe TR.   LHC done 03/23/20 showed: Hemodynamics: LV end diastolic pressure is moderately elevated. ----Coronary Angiography----- Ost RCA to Dist RCA lesion is 100% stenosed.-The PDA and PL system fills via faint collaterals from the AV groove LCx and LAD septals. Mid LAD-1 lesion is 60% stenosed. Mid LAD-2 lesion is 50% stenosed. Dist LAD lesion is 55% stenosed with 70% stenosed side branch in 2nd Diag. 1st Diag lesion is 70% stenosed. Ost Cx to Prox Cx lesion is 45% stenosed. Prox-MID Cx lesion is 65% stenosed.   MODERATE-SEVERE THREE-VESSEL CAD: 100% proximal RCA occlusion with left-to-right collaterals faintly filling PDA and PL system (AV groove LCx-PL and LAD septal-PDA) Diffuse moderate mid LAD disease with 60% to 50% stenosis. Tandem 50% and 65% proximal and mid LCx with the 65% lesion being the most significant lesion. Moderately elevated LVEDP of 18-20 mmHg  Admitted 08/21/22 due to increased facial and abdominal swelling for the past few weeks after not taking home diuretics for a few days. Given a dose of IV lasix but then placed on lasix drip. Cardiology and nephrology consults obtained. Elevated troponin thought to be due to demand ischemia. PT/OT evaluations done. Lasix drip stopped and transitioned to oral diuretics. Discharged after 11 days.     She presents today for a follow-up visit with a chief complaint of moderate shortness of breath  with minimal exertion. Describes this as chronic in nature. She has associated fatigue, chest pain, palpitations, abdominal distention, back pain and light-headedness along with this. She denies any difficulty sleeping, pedal edema or cough.   Weighing daily and has orders to take torsemide for weight gain or worsening abdominal swelling. If she takes it, she is to take it daily for 3 days in a row.   Past Medical History:  Diagnosis Date   Abnormality of gait 07/26/2013   Acute respiratory failure with hypoxia ([place]) 03/20/2020   Allergy    Arthritis    Atypical pneumonia 03/22/2020   CAD (coronary artery disease)    a. 03/2020 Cath: LM nl, LAD 60/43m 55d, D1 70, D2 70, LCX 45ost/p, 65p, RCA 100ost CTO. RPDA fills via collats from dLAD. Inf septal fills via collats from 1st septal, RPAV fills via collats from LPAV, RPL1/2 sev dzs-->med rx.   Chickenpox    Chronic combined systolic (congestive) and diastolic (congestive) heart failure ([facility]    a. 02/2021 Echo: EF 40-45%, glob HK, gr2 DD. Sev red RV fxn, RVSP 69.289mg. Mod dil LA, sev dil RA. Sev MR. Mild to mod MS. Mean MV grad 6.75m33m. Mod-Sev TR. Mild AI. Mild Ao sclerosis w/o stenosis.   COPD (chronic obstructive pulmonary disease) (HCC)    Elevated troponin 03/22/2020   GERD (gastroesophageal reflux disease)    Headache(784.0)    Migraine   Hearing loss    Right ear secondary to infection   History of shingles    Ischemic cardiomyopathy    a. 02/2021 Echo: EF  40-45%, glob HK.   Migraines    Multiple sclerosis (HCC)    Obese    Optic neuritis    PAH (pulmonary artery hypertension) ([place])    a. 02/2021 Echo: RVSP 69.50mHg.   Prolonged QT interval 03/20/2020   Severe mitral regurgitation    a. 02/2021 Echo: Severe MR w/ mild to mod MS.  Mean MV grad 6.515mg.   Past Surgical History:  Procedure Laterality Date   COLONOSCOPY WITH PROPOFOL N/A 08/03/2020   Procedure: COLONOSCOPY WITH PROPOFOL;  Surgeon: AnJonathon BellowsMD;  [facility];  Service: Gastroenterology;  Laterality: N/A;   Ganglioneuroma     Resection   LEFT HEART CATH AND CORONARY ANGIOGRAPHY N/A 03/23/2020   Procedure: LEFT HEART CATH AND CORONARY ANGIOGRAPHY;  Surgeon: HaLeonie ManMD;  [facility];  Service: Cardiovascular;  Laterality: N/A;   PILONIDAL CYST EXCISION     PILONIDAL CYST EXCISION  1983   TUMOR REMOVAL  2007/2008   Family History  Problem Relation Age of Onset   Skin cancer Mother    Lung cancer Father    Uterine cancer Sister    Heart disease Brother    Bipolar disorder Sister    Throat cancer Paternal Grandmother    Social History   Tobacco Use   Smoking status: Former    Packs/day: 1.00    Types: Cigarettes   Smokeless tobacco: Never  Substance Use Topics   Alcohol use: Yes    Comment: Occasional   Allergies  Allergen Reactions   Acthar Hp [Corticotropin] Other (See Comments)    Throat swelling and coughing up blood   Codeine    Prednisone    Prior to Admission medications   Medication Sig Start Date End Date Taking? Authorizing Provider  albuterol (VENTOLIN HFA) 108 (90 Base) MCG/ACT inhaler INHALE 1-2 PUFFS INTO THE LUNGS EVERY 6 (SIX) HOURS AS NEEDED FOR WHEEZING OR SHORTNESS OF BREATH. USE SPARINGLY! 11/13/21  Yes ClPleas KochNP  cetirizine (ZYRTEC) 10 MG tablet Take 10 mg by mouth daily.   Yes [provider]  Doxylamine Succinate, Sleep, (SLEEP AID PO) Take 1 tablet by mouth at bedtime as needed (sleep).   Yes [provider]  erythromycin ophthalmic ointment SMARTSIG:1 sparingly In Eye(s) 3 Times Daily 07/02/22  Yes [provider]  ferrous sulfate 325 (65 FE) MG EC tablet Take 1 tablet (325 mg total) by mouth 2 (two) times daily with a meal. 04/09/22  Yes YuEarlie ServerMD  fluticasone-salmeterol (ADVAIR DISKUS) 250-50 MCG/ACT AEPB INHALE 1 PUFF INTO THE LUNGS TWICE A DAY 12/02/21  Yes ClPleas KochNP  guaiFENesin (MUCINEX) 600 MG 12 hr tablet Take 600  mg by mouth 2 (two) times daily as needed for cough.   Yes [provider]  metoprolol succinate (TOPROL-XL) 25 MG 24 hr tablet Take 1 tablet (25 mg total) by mouth daily. Take with or immediately following a meal. 01/01/22 12/27/22 Yes ChBuford DresserMD  nitroGLYCERIN (NITROSTAT) 0.4 MG SL tablet Place 1 tablet (0.4 mg total) under the tongue every 5 (five) minutes as needed for chest pain. 08/02/21  Yes HaDarylene Price, FNP  omeprazole (PRILOSEC) 40 MG capsule Take 1 capsule (40 mg total) by mouth daily. 03/04/21  Yes AnJonathon BellowsMD  Pediatric Multiple Vitamins (FLINTSTONES MULTIVITAMIN) CHEW Chew by mouth.   Yes [provider]  Probiotic Product (PROBIOTIC ADVANCED) CAPS Take 1 capsule by mouth daily.   Yes [provider]  torsemide (DEMADEX) 20 MG tablet Take for weight gain of 2 lbs in 1 day or 5 lbs in 1 week or obvious swelling. 01/01/22  Yes Buford Dresser, MD  trolamine salicylate (ASPERCREME) 10 % cream Apply 1 application. topically daily as needed for muscle pain (back or joint pain).   Yes [provider]  vitamin B-12 (CYANOCOBALAMIN) 500 MCG tablet Take 1,000 mcg by mouth daily. 4 times weekly no more then 2000 MCG per week   Yes [provider]  feeding supplement, ENSURE ENLIVE, (ENSURE ENLIVE) LIQD Take 237 mLs by mouth 2 (two) times daily between meals. Patient not taking: Reported on 09/08/2022 03/28/20   Barton Dubois, MD    Review of Systems  Constitutional:  Positive for fatigue. Negative for appetite change.  HENT:  Negative for congestion, postnasal drip and sore throat.   Eyes: Negative.   Respiratory:  Positive for shortness of breath. Negative for cough and chest tightness.   Cardiovascular:  Positive for chest pain (last night) and palpitations (last night). Negative for leg swelling.  Gastrointestinal:  Positive for abdominal distention. Negative for abdominal pain.  Endocrine: Negative.   Genitourinary:  Negative.   Musculoskeletal:  Positive for back pain. Negative for neck pain.  Skin: Negative.   Allergic/Immunologic: Negative.   Neurological:  Positive for light-headedness ("all the time"). Negative for dizziness.  Hematological:  Negative for adenopathy. Does not bruise/bleed easily.  Psychiatric/Behavioral:  Negative for dysphoric mood and sleep disturbance (sleeping on 2-3 pillows).    Vitals:   09/08/22 1439  BP: (!) 128/93  Pulse: 82  Resp: 16  SpO2: 100%  Weight: 125 lb 8 oz (56.9 kg)  Height: '5\' 3"'$  (1.6 m)   Wt Readings from Last 3 Encounters:  09/08/22 125 lb 8 oz (56.9 kg)  08/31/22 116 lb 2.9 oz (52.7 kg)  06/06/22 130 lb (59 kg)   Lab Results  Component Value Date   CREATININE 1.63 (H) 09/01/2022   CREATININE 1.64 (H) 08/31/2022   CREATININE 1.50 (H) 08/30/2022   Physical Exam Vitals and nursing note reviewed. Exam conducted with a chaperone present (daughter).  Constitutional:      Appearance: Normal appearance.  HENT:     Head: Normocephalic and atraumatic.  Cardiovascular:     Rate and Rhythm: Normal rate and regular rhythm.     Heart sounds: Murmur heard.  Pulmonary:     Effort: Pulmonary effort is normal. No respiratory distress.     Breath sounds: No wheezing or rales.  Abdominal:     General: There is distension.     Tenderness: There is no abdominal tenderness.  Musculoskeletal:        General: No tenderness.     Right lower leg: No edema.     Left lower leg: No edema.  Skin:    General: Skin is warm and dry.  Neurological:     General: No focal deficit present.     Mental Status: She is alert and oriented to person, place, and time. Mental status is at baseline.  Psychiatric:        Mood and Affect: Mood normal.        Behavior: Behavior normal.        Thought Content: Thought content normal.    Assessment & Plan:  1: Chronic heart failure with reduced ejection fraction- - NYHA class III - euvolemic today - weighing daily and she  says that home weight has ranged from 125-126 pounds; reminded to call for an  overnight weight gain of > 2 pounds or a weekly weight gain of > 5 pounds - weight down 9 pounds since her last visit here 11 months ago - has orders already in place to take 2 torsemide for 3 days for above weight gain or worsening abdominal swelling - not adding salt and daughter says that she doesn't cook with salt either - saw cardiology Harrell Gave) 03/28/22; returns 09/19/22 - on GDMT of metoprolol - consider other GDMT but would need to to closely watch renal function - had video visit with palliative care on 08/21/22; next visit is tomorrow - BNP 08/21/22 was >4500.0  2: HTN- - BP looks good 128/93 - saw PCP Carlis Abbott) 02/02/22; returns next week - BMP 09/01/22 reviewed and showed sodium 13,6 potassium 5.0, creatinine 1.63 & GFR 36   3: Valvular disease- - not a candidate for surgical mitral valve replacement   Medication list reviewed.   Return in 6 weeks, sooner if needed.

## 2022-09-08 ENCOUNTER — Encounter: Payer: Self-pay | Admitting: Family

## 2022-09-08 ENCOUNTER — Ambulatory Visit: Payer: Medicare Other | Attending: Family | Admitting: Family

## 2022-09-08 VITALS — BP 128/93 | HR 82 | Resp 16 | Ht 63.0 in | Wt 125.5 lb

## 2022-09-08 DIAGNOSIS — Z87891 Personal history of nicotine dependence: Secondary | ICD-10-CM | POA: Diagnosis not present

## 2022-09-08 DIAGNOSIS — M549 Dorsalgia, unspecified: Secondary | ICD-10-CM | POA: Diagnosis not present

## 2022-09-08 DIAGNOSIS — Z79899 Other long term (current) drug therapy: Secondary | ICD-10-CM | POA: Insufficient documentation

## 2022-09-08 DIAGNOSIS — I110 Hypertensive heart disease with heart failure: Secondary | ICD-10-CM | POA: Insufficient documentation

## 2022-09-08 DIAGNOSIS — R1900 Intra-abdominal and pelvic swelling, mass and lump, unspecified site: Secondary | ICD-10-CM | POA: Insufficient documentation

## 2022-09-08 DIAGNOSIS — I5022 Chronic systolic (congestive) heart failure: Secondary | ICD-10-CM | POA: Diagnosis not present

## 2022-09-08 DIAGNOSIS — K219 Gastro-esophageal reflux disease without esophagitis: Secondary | ICD-10-CM | POA: Insufficient documentation

## 2022-09-08 DIAGNOSIS — R42 Dizziness and giddiness: Secondary | ICD-10-CM | POA: Diagnosis not present

## 2022-09-08 DIAGNOSIS — J449 Chronic obstructive pulmonary disease, unspecified: Secondary | ICD-10-CM | POA: Diagnosis not present

## 2022-09-08 DIAGNOSIS — I2721 Secondary pulmonary arterial hypertension: Secondary | ICD-10-CM | POA: Insufficient documentation

## 2022-09-08 DIAGNOSIS — I2510 Atherosclerotic heart disease of native coronary artery without angina pectoris: Secondary | ICD-10-CM | POA: Insufficient documentation

## 2022-09-08 DIAGNOSIS — R079 Chest pain, unspecified: Secondary | ICD-10-CM | POA: Insufficient documentation

## 2022-09-08 DIAGNOSIS — R002 Palpitations: Secondary | ICD-10-CM | POA: Insufficient documentation

## 2022-09-08 DIAGNOSIS — I255 Ischemic cardiomyopathy: Secondary | ICD-10-CM | POA: Diagnosis not present

## 2022-09-08 DIAGNOSIS — I10 Essential (primary) hypertension: Secondary | ICD-10-CM

## 2022-09-08 DIAGNOSIS — R0602 Shortness of breath: Secondary | ICD-10-CM | POA: Diagnosis not present

## 2022-09-08 DIAGNOSIS — I340 Nonrheumatic mitral (valve) insufficiency: Secondary | ICD-10-CM

## 2022-09-08 DIAGNOSIS — G35 Multiple sclerosis: Secondary | ICD-10-CM | POA: Diagnosis not present

## 2022-09-08 NOTE — Patient Instructions (Signed)
Continue weighing daily and call for an overnight weight gain of 3 pounds or more or a weekly weight gain of more than 5 pounds.   If you have voicemail, please make sure your mailbox is cleaned out so that we may leave a message and please make sure to listen to any voicemails.     

## 2022-09-09 ENCOUNTER — Other Ambulatory Visit: Payer: Medicare Other | Admitting: Nurse Practitioner

## 2022-09-10 ENCOUNTER — Other Ambulatory Visit: Payer: Medicare Other | Admitting: Nurse Practitioner

## 2022-09-10 DIAGNOSIS — I5043 Acute on chronic combined systolic (congestive) and diastolic (congestive) heart failure: Secondary | ICD-10-CM | POA: Diagnosis not present

## 2022-09-10 DIAGNOSIS — I130 Hypertensive heart and chronic kidney disease with heart failure and stage 1 through stage 4 chronic kidney disease, or unspecified chronic kidney disease: Secondary | ICD-10-CM | POA: Diagnosis not present

## 2022-09-10 DIAGNOSIS — I272 Pulmonary hypertension, unspecified: Secondary | ICD-10-CM | POA: Diagnosis not present

## 2022-09-10 DIAGNOSIS — R531 Weakness: Secondary | ICD-10-CM

## 2022-09-10 DIAGNOSIS — Z515 Encounter for palliative care: Secondary | ICD-10-CM

## 2022-09-10 DIAGNOSIS — R0602 Shortness of breath: Secondary | ICD-10-CM | POA: Diagnosis not present

## 2022-09-10 DIAGNOSIS — N1831 Chronic kidney disease, stage 3a: Secondary | ICD-10-CM | POA: Diagnosis not present

## 2022-09-10 DIAGNOSIS — J449 Chronic obstructive pulmonary disease, unspecified: Secondary | ICD-10-CM | POA: Diagnosis not present

## 2022-09-10 DIAGNOSIS — G35 Multiple sclerosis: Secondary | ICD-10-CM | POA: Diagnosis not present

## 2022-09-10 DIAGNOSIS — I509 Heart failure, unspecified: Secondary | ICD-10-CM | POA: Diagnosis not present

## 2022-09-10 DIAGNOSIS — N179 Acute kidney failure, unspecified: Secondary | ICD-10-CM | POA: Diagnosis not present

## 2022-09-10 NOTE — Progress Notes (Signed)
[facility] Pall[facility] Care Consult Note Telephone: [phone]  Fax: [phone]    Date of encounter: 09/10/22 3:24 PM PATIENT NAME: Anita [name] [address] [place] [address]   [phone] (home)  DOB: 29-Nov-1961 MRN: [id number] PRIMARY CARE PROVIDER:    [name], NP,  [address] [place] [address] [phone]  RESPONSIBLE PARTY:    Contact Information     Name Relation Home Work Mobile   Anita [name] Daughter [phone]        I met face to face with patient in home. Palliative Care was asked to follow this patient by consultation request of  Anita Koch, NP to address advance care planning and complex medical decision making. This is a follow up visit.                                  ASSESSMENT AND PLAN / RECOMMENDATIONS:  Symptom Management/Plan: ACP: Full code, will continue to have discussions  CHF: stable currently; Referral made for patient to be enrolled in community paramedic program. Continue to monitor daily weights and edema. Continue to take Torsemide as directed.  SOB secondary to CHF: ongoing continues to have SOB with minimal exertion. Patient is not currently on oxygen. Patient is working with [employer] physical therapy to attempt to improve conditioning. Pulmonary toileting. Monitor respiratory status. Discussed with Anita [name] and daughter, will refer to [employer] program through CHF clinic for consistent management of symptoms.   Debility secondary to MS/CHF:  continue to use rollator for ambulatory assistance. Encourage mobility as tolerated. Patient is working with [facility] physical therapy to attempt to improve strength/conditioning. Fall risk.Fall prevention education.  Palliative Care Encounter: Palliative medicine team will continue to support patient, patient's family, and medical team. Visit consisted of counseling and education dealing with the  complex and emotionally intense issues of symptom management and palliative care in the setting of serious and potentially life-threatening illness   Follow up Palliative Care Visit: Palliative care will continue to follow for complex medical decision making, advance care planning, and clarification of goals. Return 8 weeks or prn.  I spent 60 minutes providing this consultation. More than 50% of the time in this consultation was spent in counseling and care coordination.  PPS: 50% Chief Complaint: Follow up palliative consult for complex medical decision making, address goals, manage ongoing symptoms  HISTORY OF PRESENT ILLNESS:  Anita [name] is a 61 y.o. year old female  with multiple medical problems including hx of MS, CAD, severe MR with moderate mitral stenosis, COPD, tobacco use, systolic and diastolic heart failure.I called Anita [name], Anita [name]'s daughter to confirm PC visit and covid screening negative. Anita [name] was in agreement. Patient was hospitalized from 9/14-9/25 for increased facial and abdominal swelling. Patient aggressively diureses with IV diuretics. Patient discharged with home PT/OT. Today's visit conducted in patient's home. Patient's daughter present but not involved during visit though further phone discussion following visit for updated, and informed PC RN/PC SW to continue to follow, PC NP will sign off due to program change. Anita [name] is in her room, sitting on bed. Shortness of breath noted. Patient states that this is her baseline. Discussion was had about medications more specifically diuretics. Completed ros, functional and cognitive changes, medical goals reviewed. Emphasized importance of taking medication as directed and monitoring weight daily. Patient denies pain. Patient denies issues going to sleep  or staying asleep. Appetite is adequate. Patient reports she is taking in supplements and protein but not as often as she should. She denies GI or GU  issues. Patient continues to use rolator for ambulation assistance. Denies any recent falls. Patient reports starting home physical therapy and has an appointment scheduled for later today. No recent wounds or infections. Discussion of medical goals was had. Therapeutic listening and emotional support provided. Questions from patient answered.  History obtained from review of EMR, discussion with Anita [name].  I reviewed available labs, medications, imaging, studies and related documents from the EMR.  Records reviewed and summarized above.   ROS Full 10 system review of systems performed and negative with exception of: as per HPI.   Physical Exam: Current weight: 124.2 lbs (09/10/2022) Constitutional: NAD General: frai, chronically-ill appearing, pleasant female  EYES: lids intact, no discharge  ENMT: intact hearing, oral mucous membranes moist, missing teeth CV: S1S2, RRR, no LE edema Pulmonary: LCTA, no increased work of breathing, no cough, room air Abdomen: abdomen distended, ascites present,  BS+ 4 quadrants, soft and non tender GU: deferred MSK: moves all extremities, ambulatory with rollator Skin: warm and dry, no rashes or wounds on visible skin Neuro: generalized weakness, no cognitive impairment Psych: non-anxious affect, A and O x 3  Questions and concerns were addressed. The patient/family was encouraged to call with questions and/or concerns. Provided general support and encouragement, no other unmet needs identified   Thank you for the opportunity to participate in the care of [name].  The palliative care team will continue to follow. Please call our office at [phone] if we can be of additional assistance.   Keyon Liller Z Jenice Leiner, NP   COVID-19 PATIENT SCREENING TOOL Asked and negative response unless otherwise noted:   Have you had symptoms of covid, tested positive or been in contact with someone with symptoms/positive test in the past 5-10 days?

## 2022-09-11 DIAGNOSIS — I5043 Acute on chronic combined systolic (congestive) and diastolic (congestive) heart failure: Secondary | ICD-10-CM | POA: Diagnosis not present

## 2022-09-11 DIAGNOSIS — I130 Hypertensive heart and chronic kidney disease with heart failure and stage 1 through stage 4 chronic kidney disease, or unspecified chronic kidney disease: Secondary | ICD-10-CM | POA: Diagnosis not present

## 2022-09-11 DIAGNOSIS — J449 Chronic obstructive pulmonary disease, unspecified: Secondary | ICD-10-CM | POA: Diagnosis not present

## 2022-09-11 DIAGNOSIS — N179 Acute kidney failure, unspecified: Secondary | ICD-10-CM | POA: Diagnosis not present

## 2022-09-11 DIAGNOSIS — I272 Pulmonary hypertension, unspecified: Secondary | ICD-10-CM | POA: Diagnosis not present

## 2022-09-11 DIAGNOSIS — N1831 Chronic kidney disease, stage 3a: Secondary | ICD-10-CM | POA: Diagnosis not present

## 2022-09-16 ENCOUNTER — Other Ambulatory Visit: Payer: Self-pay

## 2022-09-16 ENCOUNTER — Inpatient Hospital Stay
Admission: EM | Admit: 2022-09-16 | Discharge: 2022-09-19 | DRG: 291 | Disposition: A | Discharge status: Home Health | Payer: Medicare Other | Attending: Internal Medicine | Admitting: Internal Medicine

## 2022-09-16 ENCOUNTER — Ambulatory Visit (INDEPENDENT_AMBULATORY_CARE_PROVIDER_SITE_OTHER): Payer: Medicare Other | Admitting: Primary Care

## 2022-09-16 ENCOUNTER — Telehealth: Payer: Self-pay | Admitting: Primary Care

## 2022-09-16 ENCOUNTER — Encounter: Payer: Self-pay | Admitting: Primary Care

## 2022-09-16 ENCOUNTER — Emergency Department: Payer: Medicare Other

## 2022-09-16 VITALS — BP 124/68 | HR 167 | Temp 97.5°F | Ht 63.0 in | Wt 120.0 lb

## 2022-09-16 DIAGNOSIS — R531 Weakness: Secondary | ICD-10-CM

## 2022-09-16 DIAGNOSIS — Z87891 Personal history of nicotine dependence: Secondary | ICD-10-CM

## 2022-09-16 DIAGNOSIS — I5020 Unspecified systolic (congestive) heart failure: Secondary | ICD-10-CM | POA: Diagnosis present

## 2022-09-16 DIAGNOSIS — I081 Rheumatic disorders of both mitral and tricuspid valves: Secondary | ICD-10-CM | POA: Diagnosis not present

## 2022-09-16 DIAGNOSIS — I959 Hypotension, unspecified: Secondary | ICD-10-CM | POA: Diagnosis not present

## 2022-09-16 DIAGNOSIS — R188 Other ascites: Secondary | ICD-10-CM | POA: Diagnosis present

## 2022-09-16 DIAGNOSIS — I255 Ischemic cardiomyopathy: Secondary | ICD-10-CM | POA: Diagnosis present

## 2022-09-16 DIAGNOSIS — N1831 Chronic kidney disease, stage 3a: Secondary | ICD-10-CM | POA: Diagnosis not present

## 2022-09-16 DIAGNOSIS — K219 Gastro-esophageal reflux disease without esophagitis: Secondary | ICD-10-CM | POA: Diagnosis present

## 2022-09-16 DIAGNOSIS — Z8701 Personal history of pneumonia (recurrent): Secondary | ICD-10-CM

## 2022-09-16 DIAGNOSIS — I2582 Chronic total occlusion of coronary artery: Secondary | ICD-10-CM | POA: Diagnosis present

## 2022-09-16 DIAGNOSIS — R0602 Shortness of breath: Secondary | ICD-10-CM | POA: Diagnosis not present

## 2022-09-16 DIAGNOSIS — R110 Nausea: Secondary | ICD-10-CM | POA: Diagnosis not present

## 2022-09-16 DIAGNOSIS — I5082 Biventricular heart failure: Secondary | ICD-10-CM | POA: Diagnosis present

## 2022-09-16 DIAGNOSIS — I420 Dilated cardiomyopathy: Secondary | ICD-10-CM | POA: Diagnosis not present

## 2022-09-16 DIAGNOSIS — J449 Chronic obstructive pulmonary disease, unspecified: Secondary | ICD-10-CM | POA: Diagnosis not present

## 2022-09-16 DIAGNOSIS — J432 Centrilobular emphysema: Secondary | ICD-10-CM | POA: Diagnosis not present

## 2022-09-16 DIAGNOSIS — I700 Atherosclerosis of aorta: Secondary | ICD-10-CM | POA: Diagnosis present

## 2022-09-16 DIAGNOSIS — I2729 Other secondary pulmonary hypertension: Secondary | ICD-10-CM | POA: Diagnosis present

## 2022-09-16 DIAGNOSIS — Z20822 Contact with and (suspected) exposure to covid-19: Secondary | ICD-10-CM | POA: Diagnosis present

## 2022-09-16 DIAGNOSIS — R000 Tachycardia, unspecified: Secondary | ICD-10-CM | POA: Diagnosis not present

## 2022-09-16 DIAGNOSIS — K7460 Unspecified cirrhosis of liver: Secondary | ICD-10-CM | POA: Diagnosis not present

## 2022-09-16 DIAGNOSIS — I5043 Acute on chronic combined systolic (congestive) and diastolic (congestive) heart failure: Secondary | ICD-10-CM | POA: Diagnosis present

## 2022-09-16 DIAGNOSIS — Z8249 Family history of ischemic heart disease and other diseases of the circulatory system: Secondary | ICD-10-CM

## 2022-09-16 DIAGNOSIS — G35 Multiple sclerosis: Secondary | ICD-10-CM

## 2022-09-16 DIAGNOSIS — I130 Hypertensive heart and chronic kidney disease with heart failure and stage 1 through stage 4 chronic kidney disease, or unspecified chronic kidney disease: Secondary | ICD-10-CM | POA: Diagnosis not present

## 2022-09-16 DIAGNOSIS — M159 Polyosteoarthritis, unspecified: Secondary | ICD-10-CM

## 2022-09-16 DIAGNOSIS — I340 Nonrheumatic mitral (valve) insufficiency: Secondary | ICD-10-CM | POA: Diagnosis present

## 2022-09-16 DIAGNOSIS — Z66 Do not resuscitate: Secondary | ICD-10-CM | POA: Diagnosis present

## 2022-09-16 DIAGNOSIS — E876 Hypokalemia: Secondary | ICD-10-CM | POA: Diagnosis present

## 2022-09-16 DIAGNOSIS — M1990 Unspecified osteoarthritis, unspecified site: Secondary | ICD-10-CM | POA: Diagnosis present

## 2022-09-16 DIAGNOSIS — N179 Acute kidney failure, unspecified: Secondary | ICD-10-CM | POA: Diagnosis not present

## 2022-09-16 DIAGNOSIS — I4719 Other supraventricular tachycardia: Secondary | ICD-10-CM | POA: Diagnosis not present

## 2022-09-16 DIAGNOSIS — Z888 Allergy status to other drugs, medicaments and biological substances status: Secondary | ICD-10-CM

## 2022-09-16 DIAGNOSIS — D509 Iron deficiency anemia, unspecified: Secondary | ICD-10-CM | POA: Diagnosis not present

## 2022-09-16 DIAGNOSIS — E8770 Fluid overload, unspecified: Secondary | ICD-10-CM | POA: Diagnosis present

## 2022-09-16 DIAGNOSIS — I10 Essential (primary) hypertension: Secondary | ICD-10-CM | POA: Diagnosis not present

## 2022-09-16 DIAGNOSIS — Z79899 Other long term (current) drug therapy: Secondary | ICD-10-CM

## 2022-09-16 DIAGNOSIS — J439 Emphysema, unspecified: Secondary | ICD-10-CM | POA: Diagnosis present

## 2022-09-16 DIAGNOSIS — I2489 Other forms of acute ischemic heart disease: Secondary | ICD-10-CM | POA: Diagnosis present

## 2022-09-16 DIAGNOSIS — I480 Paroxysmal atrial fibrillation: Secondary | ICD-10-CM | POA: Diagnosis present

## 2022-09-16 DIAGNOSIS — J90 Pleural effusion, not elsewhere classified: Secondary | ICD-10-CM | POA: Diagnosis not present

## 2022-09-16 DIAGNOSIS — D649 Anemia, unspecified: Secondary | ICD-10-CM | POA: Diagnosis not present

## 2022-09-16 DIAGNOSIS — N1832 Chronic kidney disease, stage 3b: Secondary | ICD-10-CM | POA: Diagnosis present

## 2022-09-16 DIAGNOSIS — I471 Supraventricular tachycardia, unspecified: Secondary | ICD-10-CM | POA: Insufficient documentation

## 2022-09-16 DIAGNOSIS — R601 Generalized edema: Secondary | ICD-10-CM | POA: Diagnosis present

## 2022-09-16 DIAGNOSIS — H9191 Unspecified hearing loss, right ear: Secondary | ICD-10-CM | POA: Diagnosis present

## 2022-09-16 DIAGNOSIS — R519 Headache, unspecified: Secondary | ICD-10-CM | POA: Diagnosis not present

## 2022-09-16 DIAGNOSIS — I272 Pulmonary hypertension, unspecified: Secondary | ICD-10-CM | POA: Diagnosis present

## 2022-09-16 DIAGNOSIS — Z8619 Personal history of other infectious and parasitic diseases: Secondary | ICD-10-CM

## 2022-09-16 DIAGNOSIS — I1310 Hypertensive heart and chronic kidney disease without heart failure, with stage 1 through stage 4 chronic kidney disease, or unspecified chronic kidney disease: Secondary | ICD-10-CM | POA: Diagnosis present

## 2022-09-16 DIAGNOSIS — E8779 Other fluid overload: Secondary | ICD-10-CM | POA: Diagnosis not present

## 2022-09-16 DIAGNOSIS — Z885 Allergy status to narcotic agent status: Secondary | ICD-10-CM

## 2022-09-16 DIAGNOSIS — Z7951 Long term (current) use of inhaled steroids: Secondary | ICD-10-CM

## 2022-09-16 DIAGNOSIS — I2510 Atherosclerotic heart disease of native coronary artery without angina pectoris: Secondary | ICD-10-CM | POA: Diagnosis not present

## 2022-09-16 LAB — CBC WITH DIFFERENTIAL/PLATELET
Abs Immature Granulocytes: 0.02 10*3/uL (ref 0.00–0.07)
Basophils Absolute: 0.1 10*3/uL (ref 0.0–0.1)
Basophils Relative: 1 %
Eosinophils Absolute: 0.1 10*3/uL (ref 0.0–0.5)
Eosinophils Relative: 1 %
HCT: 34.5 % — ABNORMAL LOW (ref 36.0–46.0)
Hemoglobin: 10.6 g/dL — ABNORMAL LOW (ref 12.0–15.0)
Immature Granulocytes: 0 %
Lymphocytes Relative: 9 %
Lymphs Abs: 0.6 10*3/uL — ABNORMAL LOW (ref 0.7–4.0)
MCH: 27.5 pg (ref 26.0–34.0)
MCHC: 30.7 g/dL (ref 30.0–36.0)
MCV: 89.4 fL (ref 80.0–100.0)
Monocytes Absolute: 0.6 10*3/uL (ref 0.1–1.0)
Monocytes Relative: 10 %
Neutro Abs: 4.9 10*3/uL (ref 1.7–7.7)
Neutrophils Relative %: 79 %
Platelets: 231 10*3/uL (ref 150–400)
RBC: 3.86 MIL/uL — ABNORMAL LOW (ref 3.87–5.11)
RDW: 18.2 % — ABNORMAL HIGH (ref 11.5–15.5)
WBC: 6.3 10*3/uL (ref 4.0–10.5)
nRBC: 0 % (ref 0.0–0.2)

## 2022-09-16 LAB — COMPREHENSIVE METABOLIC PANEL
ALT: 15 U/L (ref 0–44)
AST: 24 U/L (ref 15–41)
Albumin: 3.6 g/dL (ref 3.5–5.0)
Alkaline Phosphatase: 115 U/L (ref 38–126)
Anion gap: 11 (ref 5–15)
BUN: 32 mg/dL — ABNORMAL HIGH (ref 6–20)
CO2: 24 mmol/L (ref 22–32)
Calcium: 8.9 mg/dL (ref 8.9–10.3)
Chloride: 104 mmol/L (ref 98–111)
Creatinine, Ser: 1.65 mg/dL — ABNORMAL HIGH (ref 0.44–1.00)
GFR, Estimated: 36 mL/min — ABNORMAL LOW (ref >60)
Glucose, Bld: 95 mg/dL (ref 70–99)
Potassium: 3.2 mmol/L — ABNORMAL LOW (ref 3.5–5.1)
Sodium: 139 mmol/L (ref 135–145)
Total Bilirubin: 1.2 mg/dL (ref 0.3–1.2)
Total Protein: 7.9 g/dL (ref 6.5–8.1)

## 2022-09-16 LAB — BRAIN NATRIURETIC PEPTIDE: B Natriuretic Peptide: 4403.2 pg/mL — ABNORMAL HIGH (ref 0.0–100.0)

## 2022-09-16 LAB — MAGNESIUM: Magnesium: 1.6 mg/dL — ABNORMAL LOW (ref 1.7–2.4)

## 2022-09-16 MED ORDER — ALBUTEROL SULFATE (2.5 MG/3ML) 0.083% IN NEBU: 2.5000 mg | INHALATION_SOLUTION | Freq: Four times a day (QID) | RESPIRATORY_TRACT | Status: DC | PRN

## 2022-09-16 MED ORDER — DOXYLAMINE SUCCINATE (SLEEP) 25 MG PO TABS
25.0000 mg | ORAL_TABLET | Freq: Every evening | ORAL | Status: DC | PRN
  Administered 2022-09-17 – 2022-09-18 (×3): 25 mg via ORAL
  Filled 2022-09-16 (×5): qty 1

## 2022-09-16 MED ORDER — METOPROLOL TARTRATE 25 MG PO TABS
12.5000 mg | ORAL_TABLET | Freq: Once | ORAL | Status: AC
  Administered 2022-09-16: 12.5 mg via ORAL
  Filled 2022-09-16: qty 1

## 2022-09-16 MED ORDER — GUAIFENESIN ER 600 MG PO TB12: 600.0000 mg | ORAL_TABLET | Freq: Two times a day (BID) | ORAL | Status: DC | PRN

## 2022-09-16 MED ORDER — NITROGLYCERIN 0.4 MG SL SUBL: 0.4000 mg | SUBLINGUAL_TABLET | SUBLINGUAL | Status: DC | PRN

## 2022-09-16 MED ORDER — NICOTINE 21 MG/24HR TD PT24: 21.0000 mg | MEDICATED_PATCH | Freq: Every day | TRANSDERMAL | Status: DC | PRN

## 2022-09-16 MED ORDER — POTASSIUM CHLORIDE CRYS ER 20 MEQ PO TBCR
30.0000 meq | EXTENDED_RELEASE_TABLET | Freq: Two times a day (BID) | ORAL | Status: DC
  Filled 2022-09-16: qty 2

## 2022-09-16 MED ORDER — PANTOPRAZOLE SODIUM 40 MG PO TBEC
40.0000 mg | DELAYED_RELEASE_TABLET | Freq: Every day | ORAL | Status: DC
  Administered 2022-09-17 – 2022-09-19 (×3): 40 mg via ORAL
  Filled 2022-09-16 (×3): qty 1

## 2022-09-16 MED ORDER — SODIUM CHLORIDE 0.9% FLUSH
3.0000 mL | INTRAVENOUS | Status: DC | PRN
  Administered 2022-09-16: 3 mL via INTRAVENOUS

## 2022-09-16 MED ORDER — ENSURE ENLIVE PO LIQD
237.0000 mL | Freq: Two times a day (BID) | ORAL | Status: DC
  Administered 2022-09-19: 237 mL via ORAL

## 2022-09-16 MED ORDER — POTASSIUM CHLORIDE 20 MEQ PO PACK
60.0000 meq | PACK | Freq: Once | ORAL | Status: AC
  Administered 2022-09-16: 60 meq via ORAL

## 2022-09-16 MED ORDER — ENOXAPARIN SODIUM 30 MG/0.3ML IJ SOSY
30.0000 mg | PREFILLED_SYRINGE | INTRAMUSCULAR | Status: DC
  Administered 2022-09-16: 30 mg via SUBCUTANEOUS
  Filled 2022-09-16: qty 0.3

## 2022-09-16 MED ORDER — DULOXETINE HCL 20 MG PO CPEP: 20.0000 mg | ORAL_CAPSULE | Freq: Every day | ORAL | 0 refills | Status: DC

## 2022-09-16 MED ORDER — TROLAMINE SALICYLATE 10 % EX CREA
1.0000 | TOPICAL_CREAM | Freq: Every day | CUTANEOUS | Status: DC | PRN
  Administered 2022-09-17: 1 via TOPICAL
  Filled 2022-09-16: qty 85

## 2022-09-16 MED ORDER — CARVEDILOL 3.125 MG PO TABS
3.1250 mg | ORAL_TABLET | Freq: Two times a day (BID) | ORAL | Status: DC
  Administered 2022-09-17: 3.125 mg via ORAL
  Filled 2022-09-16: qty 1

## 2022-09-16 MED ORDER — ACETAMINOPHEN 325 MG PO TABS
650.0000 mg | ORAL_TABLET | ORAL | Status: DC | PRN
  Administered 2022-09-16 – 2022-09-19 (×8): 650 mg via ORAL
  Filled 2022-09-16 (×8): qty 2

## 2022-09-16 MED ORDER — SODIUM CHLORIDE 0.9% FLUSH
3.0000 mL | Freq: Two times a day (BID) | INTRAVENOUS | Status: DC
  Administered 2022-09-16 – 2022-09-19 (×6): 3 mL via INTRAVENOUS

## 2022-09-16 MED ORDER — SODIUM CHLORIDE 0.9 % IV SOLN: 250.0000 mL | INTRAVENOUS | Status: DC | PRN

## 2022-09-16 MED ORDER — LORATADINE 10 MG PO TABS
10.0000 mg | ORAL_TABLET | Freq: Every day | ORAL | Status: DC
  Administered 2022-09-16 – 2022-09-18 (×3): 10 mg via ORAL
  Filled 2022-09-16 (×3): qty 1

## 2022-09-16 MED ORDER — ONDANSETRON HCL 4 MG/2ML IJ SOLN
4.0000 mg | Freq: Four times a day (QID) | INTRAMUSCULAR | Status: DC | PRN
  Administered 2022-09-17 – 2022-09-18 (×3): 4 mg via INTRAVENOUS
  Filled 2022-09-16 (×3): qty 2

## 2022-09-16 MED ORDER — FUROSEMIDE 10 MG/ML IJ SOLN
40.0000 mg | Freq: Two times a day (BID) | INTRAMUSCULAR | Status: DC
  Administered 2022-09-17: 40 mg via INTRAVENOUS
  Filled 2022-09-16: qty 4

## 2022-09-16 MED ORDER — IOHEXOL 350 MG/ML SOLN
60.0000 mL | Freq: Once | INTRAVENOUS | Status: AC | PRN
  Administered 2022-09-16: 60 mL via INTRAVENOUS

## 2022-09-16 MED ORDER — SODIUM CHLORIDE 0.9 % IV BOLUS
1000.0000 mL | Freq: Once | INTRAVENOUS | Status: AC
  Administered 2022-09-16: 1000 mL via INTRAVENOUS

## 2022-09-16 MED ORDER — MAGNESIUM SULFATE 2 GM/50ML IV SOLN
2.0000 g | Freq: Once | INTRAVENOUS | Status: AC
  Administered 2022-09-16: 2 g via INTRAVENOUS
  Filled 2022-09-16: qty 50

## 2022-09-16 MED ORDER — FUROSEMIDE 10 MG/ML IJ SOLN
40.0000 mg | Freq: Once | INTRAMUSCULAR | Status: AC
  Administered 2022-09-16: 40 mg via INTRAVENOUS
  Filled 2022-09-16: qty 4

## 2022-09-16 MED ORDER — POTASSIUM CHLORIDE 10 MEQ/100ML IV SOLN: 10.0000 meq | Freq: Once | INTRAVENOUS | Status: DC

## 2022-09-16 MED ORDER — ACETAMINOPHEN 325 MG PO TABS
650.0000 mg | ORAL_TABLET | Freq: Once | ORAL | Status: AC
  Administered 2022-09-16: 650 mg via ORAL
  Filled 2022-09-16: qty 2

## 2022-09-16 MED ORDER — DULOXETINE HCL 20 MG PO CPEP
20.0000 mg | ORAL_CAPSULE | Freq: Every day | ORAL | Status: DC
  Administered 2022-09-17 – 2022-09-19 (×3): 20 mg via ORAL
  Filled 2022-09-16 (×3): qty 1

## 2022-09-16 MED ORDER — FERROUS SULFATE 325 (65 FE) MG PO TABS
325.0000 mg | ORAL_TABLET | Freq: Two times a day (BID) | ORAL | Status: DC
  Administered 2022-09-17 – 2022-09-19 (×6): 325 mg via ORAL
  Filled 2022-09-16 (×6): qty 1

## 2022-09-16 MED ORDER — FLUTICASONE FUROATE-VILANTEROL 200-25 MCG/ACT IN AEPB
1.0000 | INHALATION_SPRAY | Freq: Every day | RESPIRATORY_TRACT | Status: DC
  Administered 2022-09-17 – 2022-09-19 (×3): 1 via RESPIRATORY_TRACT
  Filled 2022-09-16 (×2): qty 28

## 2022-09-16 NOTE — ED Triage Notes (Signed)
Pt with episode of SVT today. Given 6 and then 12 of adenosine by EMS. Pt now in sinus tach. AAOx4.

## 2022-09-16 NOTE — Assessment & Plan Note (Signed)
Progressing, following with neurology through [facility].

## 2022-09-16 NOTE — Assessment & Plan Note (Signed)
Improved.  Continue Advair 250-50 mcg twice daily. Continue albuterol inhaler as needed.

## 2022-09-16 NOTE — Assessment & Plan Note (Signed)
.   Monitor periodic CBC . follow outpatient

## 2022-09-16 NOTE — Assessment & Plan Note (Signed)
.   Converted to sinus but still having significant sinus tachycardia . Beta blocker as above . Telemetry

## 2022-09-16 NOTE — Assessment & Plan Note (Addendum)
Recent hospitalization.  Hospital notes, labs, imaging reviewed. Appears mostly euvolemic today except for abdomen.  Reviewed CHF clinic office notes from October 2023.  Continue daily weights. Continue torsemide 20 mg as needed.

## 2022-09-16 NOTE — Telephone Encounter (Signed)
I am just now seeing this message. Patient was evaluated in the office today, new onset SVT, paramedics contacted and patient is now on her way to [facility] ED.

## 2022-09-16 NOTE — Assessment & Plan Note (Signed)
Recent hospitalization for AKI superimposed on stage IIIa CKD. BMP was pending for today, however we had to call paramedics for new onset SVT.  Labs will likely be drawn in the emergency department.

## 2022-09-16 NOTE — Progress Notes (Signed)
Subjective:    Patient ID: Anita [name], female    DOB: 11-Nov-1961, 61 y.o.   MRN: [id number]  HPI  Anita [name] is a very pleasant 61 y.o. female with a history of multiple sclerosis, CAD, CHF, pulmonary hypertension, migraines, moderate to severe tricuspid valve regurgitation, severe mitral valve regurgitation, esophageal dysphagia, COPD who presents today for hospital follow-up.  Admitted to [facility] from 08/21/2022 through 09/01/2022 for acute on chronic CHF, AKI, anasarca.  She presented to the ED on 08/21/2022 with increased facial abdominal swelling for several weeks, had not been taking diuretics.  Work-up in the ED revealed AKI with creatinine of 2.73, BNP of greater than 4500, troponin of 600.  She was treated with IV Lasix and admitted for further evaluation.  During her hospital stay she was transitioned from furosemide to [facility], had good output and fully diuresed by 08/31/2022, output of 8 L.  Cardiology consulted and she underwent echocardiogram on 08/22/2022 which revealed LVEF of 30 to 35%, severe tricuspid and mitral valve regurgitation, global hypokinesis, worse than prior echocardiogram.  She is not a candidate for valve replacement.   Nephrology consulted, no indication for dialysis as creatinine had improved with diuresis. ACE/ARB/ARNI/aldosterone antagonist was considered but not initiated given her AKI.  Palliative care services were initiated.  She was discharged home on 09/01/2022 with recommendations for PT, OT, cardiology, nephrology follow-up.   Since her discharge home she has been evaluated at the CHF clinic.  No changes made to medication regimen which consists of metoprolol succinate 25 mg daily, torsemide 20 mg as needed.  She is weighting herself daily and is taking torsemide 20 mg PRN if needed for weight gain. Home weights have been stable overall per her family.   She is following with neurology through [facility], not currently on  treatment as her stage of MS does respond to treatment. She is doing well on Advair 250-50 mcg for which she is taking most days of the week. She has been using her albuterol inhaler much less frequently.   She would like to try something for her chronic pain. She has pain to her fingers, bilateral knees. She's been applying Aspercreme to her left knee. She's tried Tylenol Arthritis and Aleve Arthritis with little improvement. She does experience anxiety on occasion.   She mentions some nausea and fatigue this morning. Her physical therapist came out this morning and could not work with her as her HR was in the 150's. One week ago her HR was noted to be in the 150's so her daughter provided her with her dose of metoprolol succinate and her HR reduced to 91. Her last dose of metoprolol succinate was earlier this morning.  She has no known history of atrial fibrillation and no known history of SVT.  Review of Systems  Constitutional:  Positive for fatigue.  Respiratory:  Negative for shortness of breath.   Cardiovascular:  Negative for chest pain.  Gastrointestinal:  Negative for constipation and diarrhea.  Neurological:  Positive for weakness.  Psychiatric/Behavioral:  The patient is not nervous/anxious.          Past Medical History:  Diagnosis Date   Abnormality of gait 07/26/2013   Acute respiratory failure with hypoxia ([place]) 03/20/2020   Allergy    Arthritis    Atypical pneumonia 03/22/2020   CAD (coronary artery disease)    a. 03/2020 Cath: LM nl, LAD 60/7m 55d, D1 70, D2 70, LCX 45ost/p, 65p, RCA 100ost CTO. RPDA fills  via collats from dLAD. Inf septal fills via collats from 1st septal, RPAV fills via collats from LPAV, RPL1/2 sev dzs-->med rx.   Chickenpox    Chronic combined systolic (congestive) and diastolic (congestive) heart failure ([place])    a. 02/2021 Echo: EF 40-45%, glob HK, gr2 DD. Sev red RV fxn, RVSP 69.50mHg. Mod dil LA, sev dil RA. Sev MR. Mild to mod MS. Mean MV grad  6.521mg. Mod-Sev TR. Mild AI. Mild Ao sclerosis w/o stenosis.   COPD (chronic obstructive pulmonary disease) (HCC)    Elevated troponin 03/22/2020   GERD (gastroesophageal reflux disease)    Headache(784.0)    Migraine   Hearing loss    Right ear secondary to infection   History of shingles    Ischemic cardiomyopathy    a. 02/2021 Echo: EF 40-45%, glob HK.   Migraines    Multiple sclerosis (H[facility])    Obese    Optic neuritis    [place]   a. 02/2021 Echo: RVSP 69.80m65m.   Prolonged QT interval 03/20/2020   Severe mitral regurgitation    a. 02/2021 Echo: Severe MR w/ mild to mod MS.  Mean MV grad 6.5mm75m    Social History   Socioeconomic History   Marital status: Divorced    Spouse name: Not on file   Number of children: 2   Years of education: Not on file   Highest education level: Not on file  Occupational History   Not on file  Tobacco Use   Smoking status: Former    Packs/day: 1.00    Types: Cigarettes   Smokeless tobacco: Never  Vaping Use   Vaping Use: Never used  Substance and Sexual Activity   Alcohol use: Yes    Comment: Occasional   Drug use: Never   Sexual activity: Not on file  Other Topics Concern   Not on file  Social History Narrative   Right handed    Lives in one story home   Drinks Caffeine - Sweet Tea    Social Determinants of Health   Financial Resource Strain: Low Risk  (07/08/2022)   Overall Financial Resource Strain (CARDIA)    Difficulty of Paying Living Expenses: Not hard at all  Food Insecurity: No Food Insecurity (08/22/2022)   Hunger Vital Sign    Worried About Running Out of Food in the Last Year: Never true    Ran [place] Last Year: Never true  Transportation Needs: No Transportation Needs (08/22/2022)   PRAPARE - TranHydrologistdical): No    Lack of Transportation (Non-Medical): No  Physical Activity: Inactive (07/08/2022)   Exercise Vital Sign    Days of  Exercise per Week: 0 days    Minutes of Exercise per Session: 0 min  Stress: No Stress Concern Present (07/08/2022)   [place] Mound[place]eling of Stress : Not at all  Social Connections: Socially Isolated (07/08/2022)   Social Connection and Isolation Panel [NHANES]    Frequency of Communication with Friends and Family: Three times a week    Frequency of Social Gatherings with Friends and Family: Never    Attends Religious Services: Never    ActiMarine scientistOrganizations: No    Attends ClubArchivisttings: Never    Marital Status: Divorced  IntiHuman resources officerlence: Not At Risk (08/22/2022)   Humiliation, Afraid, Rape, and Kick questionnaire  Fear of Current or Ex-Partner: No    Emotionally Abused: No    Physically Abused: No    Sexually Abused: No    Past Surgical History:  Procedure Laterality Date   COLONOSCOPY WITH PROPOFOL N/A 08/03/2020   Procedure: COLONOSCOPY WITH PROPOFOL;  Surgeon: [name], MD;  Location: [facility] ENDOSCOPY;  Service: Gastroenterology;  Laterality: N/A;   Ganglioneuroma     Resection   LEFT HEART CATH AND CORONARY ANGIOGRAPHY N/A 03/23/2020   Procedure: LEFT HEART CATH AND CORONARY ANGIOGRAPHY;  Surgeon: Leonie Man, MD;  Location: [facility];  Service: Cardiovascular;  Laterality: N/A;   PILONIDAL CYST EXCISION     PILONIDAL CYST EXCISION  1983   TUMOR REMOVAL  2007/2008    Family History  Problem Relation Age of Onset   Skin cancer Mother    Lung cancer Father    Uterine cancer Sister    Heart disease Brother    Bipolar disorder Sister    Throat cancer Paternal Grandmother     Allergies  Allergen Reactions   Acthar Hp [Corticotropin] Other (See Comments)    Throat swelling and coughing up blood   Codeine    Prednisone     Current Outpatient Medications on File Prior to Visit  Medication Sig Dispense Refill   albuterol (VENTOLIN HFA) 108 (90  Base) MCG/ACT inhaler INHALE 1-2 PUFFS INTO THE LUNGS EVERY 6 (SIX) HOURS AS NEEDED FOR WHEEZING OR SHORTNESS OF BREATH. USE SPARINGLY! 18 each 0   cetirizine (ZYRTEC) 10 MG tablet Take 10 mg by mouth daily.     Doxylamine Succinate, Sleep, (SLEEP AID PO) Take 1 tablet by mouth at bedtime as needed (sleep).     erythromycin ophthalmic ointment SMARTSIG:1 sparingly In Eye(s) 3 Times Daily     feeding supplement, ENSURE ENLIVE, (ENSURE ENLIVE) LIQD Take 237 mLs by mouth 2 (two) times daily between meals.     ferrous sulfate 325 (65 FE) MG EC tablet Take 1 tablet (325 mg total) by mouth 2 (two) times daily with a meal. 60 tablet 3   fluticasone-salmeterol (ADVAIR DISKUS) 250-50 MCG/ACT AEPB INHALE 1 PUFF INTO THE LUNGS TWICE A DAY 60 each 5   guaiFENesin (MUCINEX) 600 MG 12 hr tablet Take 600 mg by mouth 2 (two) times daily as needed for cough.     metoprolol succinate (TOPROL-XL) 25 MG 24 hr tablet Take 1 tablet (25 mg total) by mouth daily. Take with or immediately following a meal. 90 tablet 3   nitroGLYCERIN (NITROSTAT) 0.4 MG SL tablet Place 1 tablet (0.4 mg total) under the tongue every 5 (five) minutes as needed for chest pain. 20 tablet 5   omeprazole (PRILOSEC) 40 MG capsule Take 1 capsule (40 mg total) by mouth daily. 90 capsule 3   Pediatric Multiple Vitamins (FLINTSTONES MULTIVITAMIN) CHEW Chew by mouth.     Probiotic Product (PROBIOTIC ADVANCED) CAPS Take 1 capsule by mouth daily.     torsemide (DEMADEX) 20 MG tablet Take for weight gain of 2 lbs in 1 day or 5 lbs in 1 week or obvious swelling. 30 tablet 11   trolamine salicylate (ASPERCREME) 10 % cream Apply 1 application. topically daily as needed for muscle pain (back or joint pain).     vitamin B-12 (CYANOCOBALAMIN) 500 MCG tablet Take 1,000 mcg by mouth daily. 4 times weekly no more then 2000 MCG per week     No current facility-administered medications on file prior to visit.    BP 124/68  Pulse (!) 167 Comment: Resting heart  rate 157 earlier this morning  Temp (!) 97.5 F (36.4 C) (Temporal)   Ht '5\' 3"'$  (1.6 m)   Wt 120 lb (54.4 kg)   SpO2 99%   BMI 21.26 kg/m  Objective:   Physical Exam Constitutional:      General: She is not in acute distress.    Appearance: She is not ill-appearing.  Cardiovascular:     Rate and Rhythm: Normal rate and regular rhythm.  Pulmonary:     Effort: Pulmonary effort is normal.     Breath sounds: Normal breath sounds.  Musculoskeletal:     Cervical back: Neck supple.  Skin:    General: Skin is warm and dry.     Coloration: Skin is pale.  Neurological:     Mental Status: She is alert and oriented to person, place, and time.           Assessment & Plan:   Problem List Items Addressed This Visit       Cardiovascular and Mediastinum   Essential hypertension    Controlled.  Continue metoprolol succinate 25 mg daily.      Acute on chronic combined systolic and diastolic CHF (congestive heart failure) ([place])    Recent hospitalization.  Hospital notes, labs, imaging reviewed. Appears mostly euvolemic today except for abdomen.  Reviewed CHF clinic office notes from October 2023.  Continue daily weights. Continue torsemide 20 mg as needed.      Mitral valve insufficiency    Not a candidate for surgery.  Reviewed echocardiogram from September 2023 which is worse compared to prior echocardiogram. Following with cardiology and CHF clinic.      SVT (supraventricular tachycardia) - Primary    New diagnosis, captured in the office today. ECG with SVT, rate of 173.  Paramedics were contacted for further evaluation.  Unable to convert with vasovagal maneuvers. Patient was taken to [facility] for medical treatment and evaluation.      Relevant Orders   EKG 12-Lead (Completed)     Respiratory   COPD (chronic obstructive pulmonary disease) ([place])    Improved.  Continue Advair 250-50 mcg twice daily. Continue albuterol inhaler as needed.         Digestive   Gastroesophageal reflux disease    Controlled.  Continue omeprazole 40 mg daily.        Nervous and Auditory   Multiple sclerosis ([place])    Progressing, following with neurology through [place].          Musculoskeletal and Integument   Osteoarthritis    Discussed options for treatment.  Would avoid any medications that could potentially cause falls from sedation/etc.  We decided to initiate duloxetine 20 mg daily for pain and anxiety. We discussed potential side effects and expectations regarding duloxetine.  She will update in 4 weeks regarding her symptoms.      Relevant Medications   DULoxetine (CYMBALTA) 20 MG capsule     Genitourinary   Stage 3b chronic kidney disease ([place])    Recent hospitalization for AKI superimposed on stage IIIa CKD. BMP was pending for today, however we had to call paramedics for new onset SVT.  Labs will likely be drawn in the emergency department.      Relevant Orders   Basic metabolic panel    60 min spent face-to-face with patient and her daughter, and through chart review, for hospital follow-up, general follow-up, and incidental finding of SVT today.  See assessment and plan.  [name], NP

## 2022-09-16 NOTE — Assessment & Plan Note (Signed)
Controlled.  Continue omeprazole 40 mg daily. 

## 2022-09-16 NOTE — Telephone Encounter (Signed)
Tammy from [facility] called in stating that Anita [name] has an appointment today but her heart rate has been at 155 since last night. Sent over to [place].

## 2022-09-16 NOTE — Addendum Note (Signed)
Addended by: Ellamae Sia on: 09/16/2022 01:47 PM   Modules accepted: Orders

## 2022-09-16 NOTE — ED Provider Notes (Signed)
Patient care assumed at 3 PM.  Briefly, 61 year old female here with generalized fatigue and SVT that is now resolved.  Patient was sent for CT angio at time of signout.  CT angio obtained, reviewed, shows cardiomegaly, atrial dilatation consistent with elevated right heart pressures, moderate bilateral pleural effusions and pulmonary edema.  BNP is over 4000.  Magnesium 1.6 and potassium is 3.2.  Patient is extremely weak and has significant tachycardia with even sitting upright in the bed, despite breaking out of her SVT.  Suspect she will need to be diuresed as well as closely monitored with addition of beta-blockers as she is able to tolerate.  Will admit for management.   Duffy Bruce, MD 09/16/22 [phone]

## 2022-09-16 NOTE — H&P (Addendum)
History and Physical    Patient: Anita [name]:[id number] DOB: 1961-09-26 DOA: 09/16/2022 DOS: the patient was seen and examined on 09/16/2022 PCP: [name], NP  Patient coming from: Home  Chief Complaint:  Chief Complaint  Patient presents with   Tachycardia   HPI:  61 year old female with past medical history of multiple sclerosis and systolic/diastolic heart failure with moderate to severe tricuspid regurg and severe mitral regurg. Presents to ED via EMS with SVT, sent by PCP office where had ECG with SVT, rate of 173.  EMS gave her 6 mg and then 12 mg of adenosine and arrived at the ER in sinus tachycardia.  She was recently admitted for heart failure, 08/21/22-09/01/22.  Echocardiogram on that admission, LVEF 30 to 35%, LV moderately decreased function, global hypokinesis, mild LVH, indeterminate diastolic parameters.  RV systolic function severely reduced.  Patient was on Lasix drip, eventually transitioned over to oral torsemide.  Unable to add ACE/ARB/ARNI/aldosterone antagonist d/t renal function, and she is not a candidate for repair of valvular disease - cardiac treatment options limited. Palliative care following.   Patient was actually at her PCP for hospital follow-up appointment, also some complaints of fatigue, nausea, SOB. PCP reports "she is weighting herself daily and is taking torsemide 20 mg PRN if needed for weight gain. Home weights have been stable overall." Pt confirms this today.  She states that she is having a bit more trouble breathing over the past few days but overall has felt weak since her recent hospital discharge, weakness does not seem to have gotten worse over the past few days.  Not noticed any weight gain.  No chest pain, no palpitations.  Has been feeling a bit nauseous.  In ED: Post Conversion ECG: Without overt e/o STEMI, Brugadas sign, delta wave, epsilon wave, significantly prolonged QTc, or malignant arrhythmia. Given SOB, CT  angio obtained, reviewed, shows cardiomegaly, atrial dilatation consistent with elevated right heart pressures, moderate bilateral pleural effusions and pulmonary edema.  BNP over 4000.  Magnesium 1.6 and potassium 3.2.  Patient extremely weak and having significant tachycardia with even sitting upright in the bed, despite breaking out of her SVT. Received lasix and po metoprolol. Hospitalist consulted.      Consultants:  none  Procedures: none      ASSESSMENT & PLAN:   Principal Problem:   Acute on chronic combined systolic and diastolic CHF (congestive heart failure) (HCC) Active Problems:   COPD (chronic obstructive pulmonary disease) (HCC)   Multiple sclerosis (HCC)   Coronary artery disease involving native coronary artery of native heart without angina pectoris   Normocytic anemia   Pulmonary hypertension (HCC)   Ischemic cardiomyopathy   Mitral valve insufficiency   Hypokalemia   Hypervolemia   Cardiorenal syndrome   SVT (supraventricular tachycardia)   Stage 3b chronic kidney disease (HCC)   HFrEF (heart failure with reduced ejection fraction) (HCC)    Acute on chronic combined systolic and diastolic CHF (congestive heart failure) ([place]) Coronary artery disease involving native coronary artery of native heart without angina pectoris Pulmonary hypertension ([place]) Ischemic cardiomyopathy Mitral valve insufficiency Hypervolemia Per cardiology's evaluations last hospitalization, long-term she has limited options as she is not a candidate for valve repair/replacement and ARB/ACE inhibitor not felt to be appropriate given her renal function but may be able to add this if renal function improves / BP allows. Close follow up with cardiology but they were not consulted this admission.  Of note, patient does not appear to be  drastically fluid overloaded, question baseline given severe heart failure, however given increased shortness of breath recently and pulmonary  edema/effusion, diuresis w/ hospital admission felt to be warranted Diuresis Strict I&O Daily weights  Home metoprolol held, will trial carvedilol 3.125 mg bid given soft BP and titrate up as able  No ACE/ARB pending BP on the above / renal function Uncertain significance of elevated BNP, may be helpful to repeat at CHF clinic to establish baseline as this may be chronically high  SVT (supraventricular tachycardia) Converted to sinus but still having significant sinus tachycardia Beta blocker as above Telemetry   Hypokalemia Replete Monitor closely while on lasix   Stage 3b chronic kidney disease ([place]) Cardiorenal syndrome Serial BMP  COPD (chronic obstructive pulmonary disease) ([place]) Does not appear to be in acute exacerbation Continue home meds   Multiple sclerosis ([place]) Stable at this time.  Not on any medications.  Normocytic anemia IDA (iron deficiency anemia) Monitor periodic CBC follow outpatient   Tobacco abuse Have provided counseling.  Nicotine patch.           DVT prophylaxis: lovenox Pertinent IV fluids/nutrition: no IV fluids, cardiac diet  Central lines / invasive devices: none  Code Status: DNR Family Communication: Spoke with patient's daughter, [name], on the phone 09/16/2022, 6:10 PM.  She confirms the patient has not missed any medications, patient lives with [name] and [name] manages medicines other than the inhalers.  Thing to add to the above HPI  Disposition: Inpatient TOC needs: Continue home health, may need reevaluation by PT/OT Barriers to discharge / significant pending items: Diuresis          Review of Systems: As mentioned in the history of present illness. All other systems reviewed and are negative. Past Medical History:  Diagnosis Date   Abnormality of gait 07/26/2013   Acute respiratory failure with hypoxia ([place]) 03/20/2020   Allergy    Arthritis    Atypical pneumonia 03/22/2020   CAD (coronary artery  disease)    a. 03/2020 Cath: LM nl, LAD 60/44m 55d, D1 70, D2 70, LCX 45ost/p, 65p, RCA 100ost CTO. RPDA fills via collats from dLAD. Inf septal fills via collats from 1st septal, RPAV fills via collats from LPAV, RPL1/2 sev dzs-->med rx.   Chickenpox    Chronic combined systolic (congestive) and diastolic (congestive) heart failure ([place]    a. 02/2021 Echo: EF 40-45%, glob HK, gr2 DD. Sev red RV fxn, RVSP 69.250mg. Mod dil LA, sev dil RA. Sev MR. Mild to mod MS. Mean MV grad 6.41m26m. Mod-Sev TR. Mild AI. Mild Ao sclerosis w/o stenosis.   COPD (chronic obstructive pulmonary disease) (HCC)    Elevated troponin 03/22/2020   GERD (gastroesophageal reflux disease)    Headache(784.0)    Migraine   Hearing loss    Right ear secondary to infection   History of shingles    Ischemic cardiomyopathy    a. 02/2021 Echo: EF 40-45%, glob HK.   Migraines    Multiple sclerosis (HCC)    Obese    Optic neuritis    PAH (pulmonary artery hypertension) ([place]  a. 02/2021 Echo: RVSP 69.2mm63m   Prolonged QT interval 03/20/2020   Severe mitral regurgitation    a. 02/2021 Echo: Severe MR w/ mild to mod MS.  Mean MV grad 6.41mmH48m  Past Surgical History:  Procedure Laterality Date   COLONOSCOPY WITH PROPOFOL N/A 08/03/2020   Procedure: COLONOSCOPY WITH PROPOFOL;  Surgeon: Anna,[name]  [facility];  Service: Gastroenterology;  Laterality: N/A;   Ganglioneuroma     Resection   LEFT HEART CATH AND CORONARY ANGIOGRAPHY N/A 03/23/2020   Procedure: LEFT HEART CATH AND CORONARY ANGIOGRAPHY;  Surgeon: Leonie Man, MD;  Location: [facility];  Service: Cardiovascular;  Laterality: N/A;   PILONIDAL CYST EXCISION     PILONIDAL CYST EXCISION  1983   TUMOR REMOVAL  2007/2008   Social History:  reports that she has quit smoking. Her smoking use included cigarettes. She smoked an average of 1 pack per day. She has never used smokeless tobacco. She reports current alcohol use. She reports that  she does not use drugs.  Allergies  Allergen Reactions   Acthar Hp [Corticotropin] Other (See Comments)    Throat swelling and coughing up blood   Codeine    Prednisone     Family History  Problem Relation Age of Onset   Skin cancer Mother    Lung cancer Father    Uterine cancer Sister    Heart disease Brother    Bipolar disorder Sister    Throat cancer Paternal Grandmother     Prior to Admission medications   Medication Sig Start Date End Date Taking? Authorizing Provider  albuterol (VENTOLIN HFA) 108 (90 Base) MCG/ACT inhaler INHALE 1-2 PUFFS INTO THE LUNGS EVERY 6 (SIX) HOURS AS NEEDED FOR WHEEZING OR SHORTNESS OF BREATH. USE SPARINGLY! 11/13/21   [name], NP  cetirizine (ZYRTEC) 10 MG tablet Take 10 mg by mouth daily.    [provider]  Doxylamine Succinate, Sleep, (SLEEP AID PO) Take 1 tablet by mouth at bedtime as needed (sleep).    [provider]  DULoxetine (CYMBALTA) 20 MG capsule Take 1 capsule (20 mg total) by mouth daily. For anxiety and pain. 09/16/22   [name], NP  erythromycin ophthalmic ointment SMARTSIG:1 sparingly In Eye(s) 3 Times Daily 07/02/22   [provider]  feeding supplement, ENSURE ENLIVE, (ENSURE ENLIVE) LIQD Take 237 mLs by mouth 2 (two) times daily between meals. 03/28/20   Barton Dubois, MD  ferrous sulfate 325 (65 FE) MG EC tablet Take 1 tablet (325 mg total) by mouth 2 (two) times daily with a meal. 04/09/22   Earlie Server, MD  fluticasone-salmeterol (ADVAIR DISKUS) 250-50 MCG/ACT AEPB INHALE 1 PUFF INTO THE LUNGS TWICE A DAY 12/02/21   [name], NP  guaiFENesin (MUCINEX) 600 MG 12 hr tablet Take 600 mg by mouth 2 (two) times daily as needed for cough.    [provider]  metoprolol succinate (TOPROL-XL) 25 MG 24 hr tablet Take 1 tablet (25 mg total) by mouth daily. Take with or immediately following a meal. 01/01/22 12/27/22  Buford Dresser, MD  nitroGLYCERIN (NITROSTAT) 0.4 MG SL  tablet Place 1 tablet (0.4 mg total) under the tongue every 5 (five) minutes as needed for chest pain. 08/02/21   Alisa Graff, FNP  omeprazole (PRILOSEC) 40 MG capsule Take 1 capsule (40 mg total) by mouth daily. 03/04/21   [name], MD  Pediatric Multiple Vitamins (FLINTSTONES MULTIVITAMIN) CHEW Chew by mouth.    [provider]  Probiotic Product (PROBIOTIC ADVANCED) CAPS Take 1 capsule by mouth daily.    [provider]  torsemide (DEMADEX) 20 MG tablet Take for weight gain of 2 lbs in 1 day or 5 lbs in 1 week or obvious swelling. 01/01/22   Buford Dresser, MD  trolamine salicylate (ASPERCREME) 10 % cream Apply 1 application. topically daily as needed  for muscle pain (back or joint pain).    [provider]  vitamin B-12 (CYANOCOBALAMIN) 500 MCG tablet Take 1,000 mcg by mouth daily. 4 times weekly no more then 2000 MCG per week    [provider]    Physical Exam: Vitals:   09/16/22 1328 09/16/22 1430 09/16/22 1500  BP: (!) 126/90 (!) 143/101 129/88  Pulse: (!) 106 (!) 108 (!) 104  Resp: (!) 23 (!) 21 20  Temp: 98.3 F (36.8 C)    TempSrc: Oral    SpO2: 100% 99% 97%  Constitutional:  VSS, see nurse notes General Appearance: alert, thin but not cachectic, NAD Eyes: Normal lids and conjunctive, non-icteric scler Neck: No masses, trachea midline Respiratory: Normal respiratory effort (+)rales at lung bases Cardiovascular: murmur/rub/gallop auscultated Rate tachycardic  No carotid bruit or JVD No lower extremity edema Gastrointestinal: Nontender, no masses (+)mild distension No hepatomegaly, no splenomegaly appreciated Musculoskeletal:  Gait normal No clubbing/cyanosis of digits Neurological: No cranial nerve deficit on limited exam Motor and sensation intact and symmetric Psychiatric: Normal judgment/insight Normal mood and affect  Data Reviewed: Results for orders placed or performed during the hospital encounter of  09/16/22 (from the past 48 hour(s))  Comprehensive metabolic panel     Status: Abnormal   Collection Time: 09/16/22  1:29 PM  Result Value Ref Range   Sodium 139 135 - 145 mmol/L   Potassium 3.2 (L) 3.5 - 5.1 mmol/L   Chloride 104 98 - 111 mmol/L   CO2 24 22 - 32 mmol/L   Glucose, Bld 95 70 - 99 mg/dL    Comment: Glucose reference range applies only to samples taken after fasting for at least 8 hours.   BUN 32 (H) 6 - 20 mg/dL   Creatinine, Ser 1.65 (H) 0.44 - 1.00 mg/dL   Calcium 8.9 8.9 - 10.3 mg/dL   Total Protein 7.9 6.5 - 8.1 g/dL   Albumin 3.6 3.5 - 5.0 g/dL   AST 24 15 - 41 U/L   ALT 15 0 - 44 U/L   Alkaline Phosphatase 115 38 - 126 U/L   Total Bilirubin 1.2 0.3 - 1.2 mg/dL   GFR, Estimated 36 (L) >60 mL/min    Comment: (NOTE) Calculated using the CKD-EPI Creatinine Equation (2021)    Anion gap 11 5 - 15    Comment: Performed at [facility], [address] [place], [place] [address]  CBC with Differential     Status: Abnormal   Collection Time: 09/16/22  1:29 PM  Result Value Ref Range   WBC 6.3 4.0 - 10.5 K/uL   RBC 3.86 (L) 3.87 - 5.11 MIL/uL   Hemoglobin 10.6 (L) 12.0 - 15.0 g/dL   HCT 34.5 (L) 36.0 - 46.0 %   MCV 89.4 80.0 - 100.0 fL   MCH 27.5 26.0 - 34.0 pg   MCHC 30.7 30.0 - 36.0 g/dL   RDW 18.2 (H) 11.5 - 15.5 %   Platelets 231 150 - 400 K/uL   nRBC 0.0 0.0 - 0.2 %   Neutrophils Relative % 79 %   Neutro Abs 4.9 1.7 - 7.7 K/uL   Lymphocytes Relative 9 %   Lymphs Abs 0.6 (L) 0.7 - 4.0 K/uL   Monocytes Relative 10 %   Monocytes Absolute 0.6 0.1 - 1.0 K/uL   Eosinophils Relative 1 %   Eosinophils Absolute 0.1 0.0 - 0.5 K/uL   Basophils Relative 1 %   Basophils Absolute 0.1 0.0 - 0.1 K/uL  Immature Granulocytes 0 %   Abs Immature Granulocytes 0.02 0.00 - 0.07 K/uL    Comment: Performed at [facility], [address] [place], [place] [address]  Magnesium     Status: Abnormal   Collection Time: 09/16/22  1:29 PM  Result Value Ref  Range   Magnesium 1.6 (L) 1.7 - 2.4 mg/dL    Comment: Performed at [facility], [address] [place], [place] [address]  Brain natriuretic peptide     Status: Abnormal   Collection Time: 09/16/22  1:29 PM  Result Value Ref Range   B Natriuretic Peptide 4,403.2 (H) 0.0 - 100.0 pg/mL    Comment: Performed at [facility], [address] [place], [place] [address]   CT Angio Chest PE W/Cm &/Or Wo Cm  Result Date: 09/16/2022 CLINICAL DATA:  Pulmonary embolism (PE) suspected, high prob SVT today. EXAM: CT ANGIOGRAPHY CHEST WITH CONTRAST TECHNIQUE: Multidetector CT imaging of the chest was performed using the standard protocol during bolus administration of intravenous contrast. Multiplanar CT image reconstructions and MIPs were obtained to evaluate the vascular anatomy. RADIATION DOSE REDUCTION: This exam was performed according to the departmental dose-optimization program which includes automated exposure control, adjustment of the mA and/or kV according to patient size and/or use of iterative reconstruction technique. CONTRAST:  13m OMNIPAQUE IOHEXOL 350 MG/ML SOLN COMPARISON:  Chest radiograph 08/21/2022, CT 03/20/2020 FINDINGS: Cardiovascular: There are no filling defects within the pulmonary arteries to suggest pulmonary embolus. Moderate cardiomegaly. There is marked atrial dilatation. Contrast refluxes into the hepatic veins. Coronary artery calcifications. Atherosclerosis of the thoracic aorta without aneurysm. Cannot assess for dissection given phase of contrast. Small pericardial effusion with fluid in the superior pericardial recess. Mediastinum/Nodes: Scattered small mediastinal lymph nodes, not enlarged by size criteria. There is no hilar adenopathy. No esophageal wall thickening. Lungs/Pleura: Small to moderate right and minimal left pleural effusion. Mild smooth septal thickening suggestive of pulmonary edema. Mild upper lobe emphysema. No pneumonia or focal airspace  disease. No suspicious pulmonary nodule. Trachea and central bronchi are patent. Upper Abdomen: Upper abdominal ascites. Chronic left retroperitoneal like densities spans 6 cm, unchanged in present over multiple prior exams. There are surgical clips about the inferior aspect of this lesion. Musculoskeletal: Thoracic spondylosis with spurring. There are no acute or suspicious osseous abnormalities. Mild body wall edema. Review of the MIP images confirms the above findings. IMPRESSION: 1. No pulmonary embolus. 2. Moderate cardiomegaly with marked atrial dilatation. Contrast refluxes into the hepatic veins, consistent with elevated right heart pressures. 3. Small to moderate right and minimal left pleural effusions. Mild pulmonary edema. 4. Upper abdominal ascites.  Body wall edema. Aortic Atherosclerosis (ICD10-I70.0) and Emphysema (ICD10-J43.9). Electronically Signed   By: MKeith RakeM.D.   On: 09/16/2022 16:09   CT HEAD WO CONTRAST (5MM)  Result Date: 09/16/2022 CLINICAL DATA:  Headache, new or worsening (Age >= 50y) EXAM: CT HEAD WITHOUT CONTRAST TECHNIQUE: Contiguous axial images were obtained from the base of the skull through the vertex without intravenous contrast. RADIATION DOSE REDUCTION: This exam was performed according to the departmental dose-optimization program which includes automated exposure control, adjustment of the mA and/or kV according to patient size and/or use of iterative reconstruction technique. COMPARISON:  MRI head September 16, 2022. FINDINGS: Brain: No evidence of acute infarction, hemorrhage, hydrocephalus, extra-axial collection or mass lesion/mass effect. Patchy white matter hypoattenuation, nonspecific but compatible with chronic microvascular disease. Remote perforator infarct in the right basal ganglia. Cerebral atrophy. Vascular: No hyperdense  vessel identified. Skull: No acute fracture. Sinuses/Orbits: Visualized sinuses are largely clear. No acute orbital findings.  Other: No mastoid effusions. IMPRESSION: No evidence of acute intracranial abnormality. Electronically Signed   By: Margaretha Sheffield M.D.   On: 09/16/2022 16:04      Severity of Illness: The appropriate patient status for this patient is INPATIENT. Inpatient status is judged to be reasonable and necessary in order to provide the required intensity of service to ensure the patient's safety. The patient's presenting symptoms, physical exam findings, and initial radiographic and laboratory data in the context of their chronic comorbidities is felt to place them at high risk for further clinical deterioration. Furthermore, it is not anticipated that the patient will be medically stable for discharge from the hospital within 2 midnights of admission.   * I certify that at the point of admission it is my clinical judgment that the patient will require inpatient hospital care spanning beyond 2 midnights from the point of admission due to high intensity of service, high risk for further deterioration and high frequency of surveillance required.*  Author: Emeterio Reeve, DO 09/16/2022 6:22 PM  For on call review [email or web address].

## 2022-09-16 NOTE — ED Provider Notes (Signed)
[facility] Provider Note   Event Date/Time   First MD Initiated Contact with Patient 09/16/22 1326     (approximate) History  Tachycardia  HPI Anita [name] is a 61 y.o. female with a stated past medical history of SVT and paroxysmal atrial fibrillation who presents after she was feeling weak, called EMS, and was found to be in SVT.  EMS gave 6 mg and then a subsequent 12 mg of adenosine with cardioversion to normal sinus rhythm.  Patient states that she still does not feel good complaining of generalized weakness.  Patient denies any other specific complaints at this time. ROS: Patient currently denies any vision changes, tinnitus, difficulty speaking, facial droop, sore throat, chest pain, shortness of breath, abdominal pain, nausea/vomiting/diarrhea, dysuria, or numbness/paresthesias in any extremity   Physical Exam  Triage Vital Signs: ED Triage Vitals  Enc Vitals Group     BP      Pulse      Resp      Temp      Temp src      SpO2      Weight      Height      Head Circumference      Peak Flow      Pain Score      Pain Loc      Pain Edu?      Excl. in [place]?    Most recent vital signs: Vitals:   09/16/22 1328  BP: (!) 126/90  Pulse: (!) 106  Resp: (!) 23  Temp: 98.3 F (36.8 C)  SpO2: 100%   General: Awake, oriented x4. CV:  Good peripheral perfusion.  Resp:  Normal effort.  Abd:  No distention.  Other:  Middle-aged overweight Caucasian female laying in bed in no acute distress ED Results / Procedures / Treatments  Labs (all labs ordered are listed, but only abnormal results are displayed) Labs Reviewed  COMPREHENSIVE METABOLIC PANEL  CBC WITH DIFFERENTIAL/PLATELET  MAGNESIUM   EKG ED ECG REPORT I, Naaman Plummer, the attending physician, personally viewed and interpreted this ECG. Date: 09/16/2022 EKG Time: 1326 Rate: 107 Rhythm: Tachycardic sinus rhythm QRS Axis: normal Intervals: normal ST/T Wave abnormalities:  normal Narrative Interpretation: Tachycardic sinus rhythm.  No evidence of acute ischemia PROCEDURES: Critical Care performed: No .1-3 Lead EKG Interpretation  Performed by: Naaman Plummer, MD Authorized by: Naaman Plummer, MD     Interpretation: abnormal     ECG rate:  105   ECG rate assessment: tachycardic     Rhythm: sinus tachycardia     Ectopy: none     Conduction: normal    MEDICATIONS ORDERED IN ED: Medications  sodium chloride 0.9 % bolus 1,000 mL (has no administration in time range)   IMPRESSION / MDM / ASSESSMENT AND PLAN / ED COURSE  I reviewed the triage vital signs and the nursing notes.                             The patient is on the cardiac monitor to evaluate for evidence of arrhythmia and/or significant heart rate changes. Patient's presentation is most consistent with acute presentation with potential threat to life or bodily function. Presents with palpitations and ECG is noted to be indicative of supraventricular tachycardia. Palpitations unlikely secondary to other concomitant cause such as pulmonary embolus or ACS.  Interventions: Patient maintained normal sinus rhythm and therefore did not need  any in-hospital interventions  Post Conversion ECG: Without overt e/o STEMI, Brugadas sign, delta wave, epsilon wave, significantly prolonged QTc, or malignant arrhythmia.  Disposition: Counseled patient to avoid stimulants. This is not patients first SVT. Counseled patient to follow up within 24-48 hours with their cardiologist and primary care doctors. Discharge home with SRP.   FINAL CLINICAL IMPRESSION(S) / ED DIAGNOSES   Final diagnoses:  Paroxysmal SVT (supraventricular tachycardia)  Generalized weakness   Rx / DC Orders   ED Discharge Orders     None      Note:  This document was prepared using Dragon voice recognition software and may include unintentional dictation errors.   Naaman Plummer, MD 09/16/22 1400

## 2022-09-16 NOTE — ED Notes (Signed)
Assumed care of pt. Pt changed into new diaper and clean bed sheets. Pt awake and alert. Vital signs stable.

## 2022-09-16 NOTE — Assessment & Plan Note (Signed)
Controlled.  Continue metoprolol succinate 25 mg daily. 

## 2022-09-16 NOTE — Assessment & Plan Note (Signed)
.   Replete . Monitor closely while on lasix

## 2022-09-16 NOTE — Assessment & Plan Note (Signed)
Per cardiology's evaluations last hospitalization, long-term she has limited options as she is not a candidate for valve repair/replacement and ARB/ACE inhibitor not felt to be appropriate given her renal function but may be able to add this if renal function improves / BP allows.Close follow up with cardiologybut they were not consulted this admission.  Of note, patient does not appear to be drastically fluid overloaded, question baseline given severe heart failure, however given increased shortness of breath recently and pulmonary edema/effusion, diuresis w/ hospital admission felt to be warranted . Diuresis . Strict I&O . Daily weights  . Home metoprolol held, will trial carvedilol 3.125 mg bid given soft BP and titrate up as able  . No ACE/ARB pending BP on the above / renal function

## 2022-09-16 NOTE — Assessment & Plan Note (Signed)
New diagnosis, captured in the office today. ECG with SVT, rate of 173.  Paramedics were contacted for further evaluation.  Unable to convert with vasovagal maneuvers. Patient was taken to [facility] for medical treatment and evaluation.

## 2022-09-16 NOTE — ED Notes (Signed)
Pt soiled brief, new brief provided to Pt. Pt also placed on purewick. Pt voices no other needs at this time.

## 2022-09-16 NOTE — Assessment & Plan Note (Signed)
Discussed options for treatment.  Would avoid any medications that could potentially cause falls from sedation/etc.  We decided to initiate duloxetine 20 mg daily for pain and anxiety. We discussed potential side effects and expectations regarding duloxetine.  She will update in 4 weeks regarding her symptoms.

## 2022-09-16 NOTE — Hospital Course (Addendum)
61 year old female with past medical history of multiple sclerosis and systolic/diastolic heart failure with moderate to severe tricuspid regurg and severe mitral regurg. Presents to ED via EMS with SVT, sent by PCP office where had ECG with SVT, rate of 173.  EMS gave her 6 mg and then 12 mg of adenosine and arrived at the ER in sinus tachycardia.  She was recently admitted for heart failure, 08/21/22-09/01/22.  Echocardiogram on that admission, LVEF 30 to 35%, LV moderately decreased function, global hypokinesis, mild LVH, indeterminate diastolic parameters.  RV systolic function severely reduced.  Patient was on Lasix drip, eventually transitioned over to oral torsemide.  Unable to add ACE/ARB/ARNI/aldosterone antagonist d/t renal function, and she is not a candidate for repair of valvular disease - cardiac treatment options limited. Palliative care following.   Patient was actually at her PCP for hospital follow-up appointment, also some complaints of fatigue, nausea, SOB. PCP reports "she is weighting herself daily and is taking torsemide 20 mg PRN if needed for weight gain. Home weights have been stable overall." Pt confirms this today.  She states that she is having a bit more trouble breathing over the past few days but overall has felt weak since her recent hospital discharge, weakness does not seem to have gotten worse over the past few days.  Not noticed any weight gain.  No chest pain, no palpitations.  Has been feeling a bit nauseous.  In ED: Post Conversion ECG: Without overt e/o STEMI, Brugadas sign, delta wave, epsilon wave, significantly prolonged QTc, or malignant arrhythmia. Given SOB, CT angio obtained, reviewed, shows cardiomegaly, atrial dilatation consistent with elevated right heart pressures, moderate bilateral pleural effusions and pulmonary edema.  BNP over 4000.  Magnesium 1.6 and potassium 3.2.  Patient extremely weak and having significant tachycardia with even sitting upright  in the bed, despite breaking out of her SVT. Received lasix and po metoprolol. Hospitalist consulted.      Consultants:  none  Procedures: none      ASSESSMENT & PLAN:   Principal Problem:   Acute on chronic combined systolic and diastolic CHF (congestive heart failure) (HCC) Active Problems:   COPD (chronic obstructive pulmonary disease) (HCC)   Multiple sclerosis (HCC)   Coronary artery disease involving native coronary artery of native heart without angina pectoris   Normocytic anemia   Pulmonary hypertension (HCC)   Ischemic cardiomyopathy   Mitral valve insufficiency   Hypokalemia   Hypervolemia   Cardiorenal syndrome   SVT (supraventricular tachycardia)   Stage 3b chronic kidney disease (HCC)   HFrEF (heart failure with reduced ejection fraction) (HCC)    Acute on chronic combined systolic and diastolic CHF (congestive heart failure) ([place]) Coronary artery disease involving native coronary artery of native heart without angina pectoris Pulmonary hypertension ([place]) Ischemic cardiomyopathy Mitral valve insufficiency Hypervolemia Per cardiology's evaluations last hospitalization, long-term she has limited options as she is not a candidate for valve repair/replacement and ARB/ACE inhibitor not felt to be appropriate given her renal function but may be able to add this if renal function improves / BP allows. Close follow up with cardiology but they were not consulted this admission.  Of note, patient does not appear to be drastically fluid overloaded, question baseline given severe heart failure, however given increased shortness of breath recently and pulmonary edema/effusion, diuresis w/ hospital admission felt to be warranted Diuresis Strict I&O Daily weights  Home metoprolol held, will trial carvedilol 3.125 mg bid given soft BP and titrate up as able  No ACE/ARB pending BP on the above / renal function Uncertain significance of elevated BNP, may be helpful to  repeat at CHF clinic to establish baseline as this may be chronically high  SVT (supraventricular tachycardia) Converted to sinus but still having significant sinus tachycardia Beta blocker as above Telemetry   Hypokalemia Replete Monitor closely while on lasix   Stage 3b chronic kidney disease (HCC) Cardiorenal syndrome Serial BMP  COPD (chronic obstructive pulmonary disease) ([place]) Does not appear to be in acute exacerbation Continue home meds   Multiple sclerosis ([place]) Stable at this time.  Not on any medications.  Normocytic anemia IDA (iron deficiency anemia) Monitor periodic CBC follow outpatient   Tobacco abuse Have provided counseling.  Nicotine patch.           DVT prophylaxis: lovenox Pertinent IV fluids/nutrition: no IV fluids, cardiac diet  Central lines / invasive devices: none  Code Status: DNR Family Communication: Spoke with patient's daughter, [name], on the phone 09/16/2022, 6:10 PM.  She confirms the patient has not missed any medications, patient lives with [name] and [name] manages medicines other than the inhalers.  Thing to add to the above HPI  Disposition: Inpatient TOC needs: Continue home health, may need reevaluation by PT/OT Barriers to discharge / significant pending items: Diuresis

## 2022-09-16 NOTE — Assessment & Plan Note (Signed)
Not a candidate for surgery.  Reviewed echocardiogram from September 2023 which is worse compared to prior echocardiogram. Following with cardiology and CHF clinic.

## 2022-09-16 NOTE — Assessment & Plan Note (Signed)
.   Serial BMP

## 2022-09-16 NOTE — Telephone Encounter (Signed)
Spoke to [place] and patient. Denies any chest pain, dizziness, headache or sob. If any before appointment today at 11:20 will be seen at ED.  States that she had same symptoms of elevated heart rate about a week ago and improved over time. Patient will come in for appointment today as scheduled.

## 2022-09-16 NOTE — ED Notes (Signed)
Pt soiled with feces, Changed into clean diaper.

## 2022-09-16 NOTE — ED Notes (Addendum)
Pt had large loose dark green black BM. Pt cleaned up and clean linens and brief placed. Pt placed in clean gown. Pt given blankets and purewick placed. Pt placed back on monitor.

## 2022-09-16 NOTE — Assessment & Plan Note (Signed)
.   Stable at this time. Not on any medications.

## 2022-09-17 ENCOUNTER — Other Ambulatory Visit: Payer: Self-pay

## 2022-09-17 DIAGNOSIS — I471 Supraventricular tachycardia, unspecified: Secondary | ICD-10-CM

## 2022-09-17 DIAGNOSIS — I2510 Atherosclerotic heart disease of native coronary artery without angina pectoris: Secondary | ICD-10-CM

## 2022-09-17 DIAGNOSIS — I5043 Acute on chronic combined systolic (congestive) and diastolic (congestive) heart failure: Secondary | ICD-10-CM | POA: Diagnosis not present

## 2022-09-17 DIAGNOSIS — I340 Nonrheumatic mitral (valve) insufficiency: Secondary | ICD-10-CM | POA: Diagnosis not present

## 2022-09-17 LAB — SARS CORONAVIRUS 2 BY RT PCR: SARS Coronavirus 2 by RT PCR: NEGATIVE

## 2022-09-17 LAB — BASIC METABOLIC PANEL
Anion gap: 8 (ref 5–15)
BUN: 30 mg/dL — ABNORMAL HIGH (ref 6–20)
CO2: 22 mmol/L (ref 22–32)
Calcium: 7.9 mg/dL — ABNORMAL LOW (ref 8.9–10.3)
Chloride: 107 mmol/L (ref 98–111)
Creatinine, Ser: 1.48 mg/dL — ABNORMAL HIGH (ref 0.44–1.00)
GFR, Estimated: 41 mL/min — ABNORMAL LOW (ref >60)
Glucose, Bld: 80 mg/dL (ref 70–99)
Potassium: 3 mmol/L — ABNORMAL LOW (ref 3.5–5.1)
Sodium: 137 mmol/L (ref 135–145)

## 2022-09-17 LAB — MAGNESIUM: Magnesium: 1.9 mg/dL (ref 1.7–2.4)

## 2022-09-17 MED ORDER — AMIODARONE HCL IN DEXTROSE 360-4.14 MG/200ML-% IV SOLN
60.0000 mg/h | INTRAVENOUS | Status: AC
  Administered 2022-09-17 (×3): 60 mg/h via INTRAVENOUS
  Filled 2022-09-17 (×3): qty 200

## 2022-09-17 MED ORDER — ENOXAPARIN SODIUM 40 MG/0.4ML IJ SOSY
40.0000 mg | PREFILLED_SYRINGE | INTRAMUSCULAR | Status: DC
  Administered 2022-09-17: 40 mg via SUBCUTANEOUS
  Filled 2022-09-17: qty 0.4

## 2022-09-17 MED ORDER — POTASSIUM CHLORIDE 20 MEQ PO PACK
60.0000 meq | PACK | Freq: Once | ORAL | Status: AC
  Administered 2022-09-17: 60 meq via ORAL
  Filled 2022-09-17: qty 3

## 2022-09-17 MED ORDER — ASPIRIN 81 MG PO TBEC
81.0000 mg | DELAYED_RELEASE_TABLET | Freq: Every day | ORAL | Status: DC
  Administered 2022-09-17 – 2022-09-19 (×3): 81 mg via ORAL
  Filled 2022-09-17 (×3): qty 1

## 2022-09-17 MED ORDER — SODIUM CHLORIDE 0.9 % IV BOLUS
250.0000 mL | Freq: Once | INTRAVENOUS | Status: AC
  Administered 2022-09-17: 250 mL via INTRAVENOUS

## 2022-09-17 MED ORDER — METOPROLOL TARTRATE 5 MG/5ML IV SOLN
5.0000 mg | Freq: Once | INTRAVENOUS | Status: AC
  Administered 2022-09-17: 5 mg via INTRAVENOUS

## 2022-09-17 MED ORDER — ADENOSINE 12 MG/4ML IV SOLN
INTRAVENOUS | Status: AC
  Filled 2022-09-17: qty 4

## 2022-09-17 MED ORDER — METOPROLOL TARTRATE 5 MG/5ML IV SOLN
INTRAVENOUS | Status: AC
  Filled 2022-09-17: qty 5

## 2022-09-17 MED ORDER — ADENOSINE 6 MG/2ML IV SOLN
6.0000 mg | Freq: Once | INTRAVENOUS | Status: AC
  Administered 2022-09-17: 6 mg via INTRAVENOUS
  Filled 2022-09-17: qty 2

## 2022-09-17 MED ORDER — ATORVASTATIN CALCIUM 20 MG PO TABS
20.0000 mg | ORAL_TABLET | Freq: Every day | ORAL | Status: DC
  Administered 2022-09-17 – 2022-09-18 (×2): 20 mg via ORAL
  Filled 2022-09-17 (×2): qty 1

## 2022-09-17 MED ORDER — METOPROLOL TARTRATE 25 MG PO TABS: 12.5000 mg | ORAL_TABLET | Freq: Two times a day (BID) | ORAL | Status: DC

## 2022-09-17 MED ORDER — AMIODARONE LOAD VIA INFUSION
150.0000 mg | Freq: Once | INTRAVENOUS | Status: AC
  Administered 2022-09-17: 150 mg via INTRAVENOUS
  Filled 2022-09-17: qty 83.34

## 2022-09-17 MED ORDER — SODIUM CHLORIDE 0.9 % IV BOLUS
500.0000 mL | Freq: Once | INTRAVENOUS | Status: AC
  Administered 2022-09-17: 500 mL via INTRAVENOUS

## 2022-09-17 MED ORDER — DILTIAZEM HCL-DEXTROSE 125-5 MG/125ML-% IV SOLN (PREMIX)
5.0000 mg/h | INTRAVENOUS | Status: DC
  Administered 2022-09-17: 5 mg/h via INTRAVENOUS
  Filled 2022-09-17: qty 125

## 2022-09-17 MED ORDER — ADENOSINE 6 MG/2ML IV SOLN
INTRAVENOUS | Status: AC
  Administered 2022-09-17: 6 mg via INTRAVENOUS
  Filled 2022-09-17: qty 6

## 2022-09-17 MED ORDER — ADENOSINE 12 MG/4ML IV SOLN
12.0000 mg | Freq: Once | INTRAVENOUS | Status: AC
  Administered 2022-09-17: 12 mg via INTRAVENOUS

## 2022-09-17 MED ORDER — TORSEMIDE 20 MG PO TABS
20.0000 mg | ORAL_TABLET | Freq: Every day | ORAL | Status: DC
  Filled 2022-09-17: qty 1

## 2022-09-17 MED ORDER — METOPROLOL TARTRATE 25 MG PO TABS
25.0000 mg | ORAL_TABLET | Freq: Two times a day (BID) | ORAL | Status: DC
  Administered 2022-09-18: 25 mg via ORAL
  Filled 2022-09-17: qty 1

## 2022-09-17 MED ORDER — ADENOSINE 6 MG/2ML IV SOLN: 6.0000 mg | Freq: Once | INTRAVENOUS | Status: AC

## 2022-09-17 MED ORDER — AMIODARONE HCL IN DEXTROSE 360-4.14 MG/200ML-% IV SOLN
30.0000 mg/h | INTRAVENOUS | Status: DC
  Administered 2022-09-18: 30 mg/h via INTRAVENOUS
  Filled 2022-09-17: qty 200

## 2022-09-17 NOTE — Consult Note (Signed)
Cardiology Consultation:   Patient ID: Anita [name]; [id number]; Apr 19, 1961   Admit date: 09/16/2022 Date of Consult: 09/17/2022  Primary Care Provider: [name], NP Primary Cardiologist: Preston Fleeting Electrophysiologist:  None   Patient Profile:   Anita [name] is a 61 y.o. 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 Kurtis Bushman.  History of Present Illness:   Anita [name] was admitted to the hospital in 03/2020 with acute respiratory failure with hypoxia in the setting of atypical pneumonia complicated by elevated troponin.  Echo during the admission demonstrated an EF of 40 to 45%, global hypokinesis, normal RV systolic function, moderately elevated PASP, mildly dilated left atrium, moderate to severe mitral regurgitation, mild mitral stenosis, and an estimated right atrial pressure 15 mmHg.  Cardiac cath during that admission demonstrated moderate to severe three-vessel CAD including proximal RCA occlusion with left-to-right collaterals faintly filling PDA and PL system, diffuse moderate mid LAD disease, and tandem proximal and mid LCx stenosis.  Medical therapy was recommended in light of concern she would not be a good CABG candidate.  Echo in 02/2021 demonstrated an EF of 40 to 45%, global hypokinesis, grade 2 diastolic dysfunction, severely reduced RV systolic  function, mildly enlarged RV cavity size, severely elevated PASP estimated at 69.2 mmHg, moderately dilated left atrium, severely dilated right atrium, tethering of the mitral valve leaflets due to mild LV dysfunction, severe mitral regurgitation, mild to moderate mitral stenosis, moderate to severe tricuspid regurgitation, mild aortic sclerosis without evidence of stenosis.  With regards to her mitral valve disease, she has not been interested in surgical repair.  Outpatient cardiac monitor in 09/2021 showed a predominant rhythm of sinus with 2 episodes of NSVT with the fastest and longest episode lasting 11 beats, 20 episodes of SVT with the fastest and longest interval lasting 53 minutes and 7 seconds.  She has been maintained on metoprolol with reasonable control.  She was most recently admitted to the hospital in 08/2022 with acute on chronic combined systolic and diastolic CHF/anasarca secondary to right heart failure complicated by acute on CKD stage III, transaminitis, and demand ischemia.  Documentation indicates that she had not been taking diuretic at home.  She was diuresed with Lasix drip with the assistance of nephrology.  Echo during the admission demonstrated an EF of 30 to 35%, global hypokinesis, mild LVH, severely reduced RV systolic function with moderately enlarged ventricular cavity size, moderately dilated right atrium, severe mitral valve regurgitation with no evidence of mitral stenosis, severe tricuspid regurgitation, and aortic valve sclerosis without evidence of stenosis.  She was evaluated by her PCP on 10/10 for prior hospital follow-up with noted nausea and fatigue.  It was noted PT worked with her at home earlier that morning with heart rates in the 150s bpm.  She reported this to also be the case the week prior with improvement with metoprolol.  At her PCPs office, she was noted to be in SVT.  EMS was called with patient converting to sinus rhythm  following 6-12 mg of adenosine.   Upon arrival to the ED, she was maintaining sinus rhythm with stable BP.  Labs notable for BNP 4403, potassium 3.2 trending to 3.0, BUN 32 trending to 30, serum creatinine 1.65 trending to 1.48, magnesium 1.6, and Hgb 10.6.  CTA chest showed no evidence of PE with moderate cardiomegaly, contrast reflux into the hepatic veins consistent with elevated right heart pressure, small to moderate right and minimal left pleural effusions, mild pulmonary edema, upper abdominal ascites and body wall edema.  CT of the head showed no evidence of acute intracranial abnormality.  In the ED, she received IV Lasix, potassium, and magnesium.  She was maintaining sinus rhythm until just after 7 AM on 09/17/2022, at which time she redeveloped SVT with rates in the 150s to 160s bpm.  She was placed on IV Cardizem drip and given carvedilol 3.125 mg twice daily.  Relative hypotension precluded escalation of Cardizem.  Given this, she was given 6 mg of adenosine rapid IV push at 8:18 without conversion to sinus rhythm or improvement in ventricular rates.  She was subsequently given 12 mg of adenosine with conversion to sinus rhythm at 8:21.  She remained hemodynamically stable.  EKG following conversion to sinus rhythm showed no acute MI.   Past Medical History:  Diagnosis Date   Abnormality of gait 07/26/2013   Acute respiratory failure with hypoxia ([place]) 03/20/2020   Allergy    Arthritis    Atypical pneumonia 03/22/2020   CAD (coronary artery disease)    a. 03/2020 Cath: LM nl, LAD 60/24m 55d, D1 70, D2 70, LCX 45ost/p, 65p, RCA 100ost CTO. RPDA fills via collats from dLAD. Inf septal fills via collats from 1st septal, RPAV fills via collats from LPAV, RPL1/2 sev dzs-->med rx.   Chickenpox    Chronic combined systolic (congestive) and diastolic (congestive) heart failure [place]    a. 02/2021 Echo: EF 40-45%, glob HK, gr2 DD. Sev red RV fxn, RVSP 69.273mg. Mod dil LA, sev dil RA. Sev MR. Mild to mod MS. Mean MV grad 6.64m164m.  Mod-Sev TR. Mild AI. Mild Ao sclerosis w/o stenosis.   COPD (chronic obstructive pulmonary disease) (HCC)    Elevated troponin 03/22/2020   GERD (gastroesophageal reflux disease)    Headache(784.0)    Migraine   Hearing loss    Right ear secondary to infection   History of shingles    Ischemic cardiomyopathy    a. 02/2021 Echo: EF 40-45%, glob HK.   Migraines    Multiple sclerosis (HCC)    Obese    Optic neuritis    [place] artery [place]) [place]  a. 02/2021 Echo: RVSP 69.2mm14m   Prolonged QT interval 03/20/2020   Severe mitral regurgitation    a. 02/2021 Echo: Severe MR w/ mild to mod MS.  Mean MV grad 6.64mmH60m   Past Surgical History:  Procedure Laterality Date   COLONOSCOPY WITH PROPOFOL N/A 08/03/2020   Procedure: COLONOSCOPY WITH PROPOFOL;  Surgeon: Anna,[name]  [facility];  Service: Gastroenterology;  Laterality: N/A;   Ganglioneuroma     Resection   LEFT HEART CATH AND CORONARY ANGIOGRAPHY N/A 03/23/2020   Procedure: LEFT HEART CATH AND CORONARY ANGIOGRAPHY;  Surgeon: HardiLeonie Man  [facility];  Service: Cardiovascular;  Laterality: N/A;   PILONIDAL CYST EXCISION     PILONIDAL CYST EXCISION  1983   TUMOR REMOVAL  2007/2008     Home Meds: Prior to Admission  medications   Medication Sig Start Date End Date Taking? Authorizing Provider  DULoxetine (CYMBALTA) 20 MG capsule Take 1 capsule (20 mg total) by mouth daily. For anxiety and pain. 09/16/22  Yes [name], NP  ferrous sulfate 325 (65 FE) MG EC tablet Take 1 tablet (325 mg total) by mouth 2 (two) times daily with a meal. 04/09/22  Yes Earlie Server, MD  fluticasone-salmeterol (ADVAIR DISKUS) 250-50 MCG/ACT AEPB INHALE 1 PUFF INTO THE LUNGS TWICE A DAY 12/02/21  Yes [name], NP  metoprolol succinate (TOPROL-XL) 25 MG 24 hr tablet Take 1 tablet (25 mg total) by mouth daily. Take with or immediately following a meal. 01/01/22 12/27/22 Yes Buford Dresser, MD  omeprazole (PRILOSEC) 40 MG capsule Take 1 capsule (40 mg total) by mouth daily. 03/04/21  Yes [name], MD  Pediatric Multiple Vitamins (FLINTSTONES MULTIVITAMIN) CHEW Chew by mouth.   Yes [provider]  Probiotic Product (PROBIOTIC ADVANCED) CAPS Take 1 capsule by mouth daily.   Yes [provider]  torsemide (DEMADEX) 20 MG tablet Take for weight gain of 2 lbs in 1 day or 5 lbs in 1 week or obvious swelling. 01/01/22  Yes Buford Dresser, MD  vitamin B-12 (CYANOCOBALAMIN) 500 MCG tablet Take 500 mcg by mouth 4 (four) times a week. 4 times weekly no more than 2000 MCG per week   Yes [provider]  albuterol (VENTOLIN HFA) 108 (90 Base) MCG/ACT inhaler INHALE 1-2 PUFFS INTO THE LUNGS EVERY 6 (SIX) HOURS AS NEEDED FOR WHEEZING OR SHORTNESS OF BREATH. USE SPARINGLY! 11/13/21   [name], NP  cetirizine (ZYRTEC) 10 MG tablet Take 10 mg by mouth daily.    [provider]  Doxylamine Succinate, Sleep, (SLEEP AID PO) Take 1 tablet by mouth at bedtime as needed (sleep).    [provider]  feeding supplement, ENSURE ENLIVE, (ENSURE ENLIVE) LIQD Take 237 mLs by mouth 2 (two) times daily between meals. 03/28/20   Barton Dubois, MD  guaiFENesin (MUCINEX) 600 MG 12 hr tablet Take 600 mg by mouth 2 (two) times daily as needed for cough.    [provider]  nitroGLYCERIN (NITROSTAT) 0.4 MG SL tablet Place 1 tablet (0.4 mg total) under the tongue every 5 (five) minutes as needed for chest pain. 08/02/21   Alisa Graff, FNP  trolamine salicylate (ASPERCREME) 10 % cream Apply 1 application. topically daily as needed for muscle pain (back or joint pain).    [provider]    Inpatient Medications: Scheduled Meds:  carvedilol  3.125 mg Oral BID WC   DULoxetine  20 mg Oral Daily   enoxaparin (LOVENOX) injection  30 mg Subcutaneous Q24H   feeding supplement  237 mL Oral BID BM   ferrous sulfate  325 mg Oral BID WC    fluticasone furoate-vilanterol  1 puff Inhalation Daily   furosemide  40 mg Intravenous BID   loratadine  10 mg Oral Daily   pantoprazole  40 mg Oral Daily   sodium chloride flush  3 mL Intravenous Q12H   Continuous Infusions:  sodium chloride     diltiazem (CARDIZEM) infusion 5 mg/hr (09/17/22 0740)   PRN Meds: sodium chloride, acetaminophen, albuterol, doxylamine (Sleep), guaiFENesin, nicotine, nitroGLYCERIN, ondansetron (ZOFRAN) IV, sodium chloride flush, trolamine salicylate  Allergies:   Allergies  Allergen Reactions   Acthar Hp [Corticotropin] Other (See Comments)    Throat swelling and coughing up blood   Codeine    Prednisone  Social History:   Social History   Socioeconomic History   Marital status: Divorced    Spouse name: Not on file   Number of children: 2   Years of education: Not on file   Highest education level: Not on file  Occupational History   Not on file  Tobacco Use   Smoking status: Former    Packs/day: 1.00    Types: Cigarettes   Smokeless tobacco: Never  Vaping Use   Vaping Use: Never used  Substance and Sexual Activity   Alcohol use: Yes    Comment: Occasional   Drug use: Never   Sexual activity: Not on file  Other Topics Concern   Not on file  Social History Narrative   Right handed    Lives in one story home   Drinks Caffeine - Sweet Tea    Social Determinants of Health   Financial Resource Strain: Low Risk  (07/08/2022)   Overall Financial Resource Strain (CARDIA)    Difficulty of Paying Living Expenses: Not hard at all  Food Insecurity: No Food Insecurity (08/22/2022)   Hunger Vital Sign    Worried About Running Out of Food in the Last Year: Never true    [place] in the Last Year: Never true  Transportation Needs: No Transportation Needs (08/22/2022)   PRAPARE - Hydrologist (Medical): No    Lack of Transportation (Non-Medical): No  Physical Activity: Inactive (07/08/2022)   Exercise  Vital Sign    Days of Exercise per Week: 0 days    Minutes of Exercise per Session: 0 min  Stress: No Stress Concern Present (07/08/2022)   [place]    Feeling of Stress : Not at all  Social Connections: Socially Isolated (07/08/2022)   Social Connection and Isolation Panel [NHANES]    Frequency of Communication with Friends and Family: Three times a week    Frequency of Social Gatherings with Friends and Family: Never    Attends Religious Services: Never    Marine scientist or Organizations: No    Attends Archivist Meetings: Never    Marital Status: Divorced  Human resources officer Violence: Not At Risk (08/22/2022)   Humiliation, Afraid, Rape, and Kick questionnaire    Fear of Current or Ex-Partner: No    Emotionally Abused: No    Physically Abused: No    Sexually Abused: No     Family History:   Family History  Problem Relation Age of Onset   Skin cancer Mother    Lung cancer Father    Uterine cancer Sister    Heart disease Brother    Bipolar disorder Sister    Throat cancer Paternal Grandmother     ROS:  Review of Systems  Constitutional:  Positive for malaise/fatigue. Negative for chills, diaphoresis, fever and weight loss.  HENT:  Negative for congestion.   Eyes:  Negative for discharge and redness.  Respiratory:  Positive for shortness of breath. Negative for cough, sputum production and wheezing.   Cardiovascular:  Positive for palpitations. Negative for chest pain, orthopnea, claudication, leg swelling and PND.  Gastrointestinal:  Negative for abdominal pain, blood in stool, heartburn, melena, nausea and vomiting.       Abdominal swelling  Musculoskeletal:  Negative for falls and myalgias.  Skin:  Negative for rash.  Neurological:  Positive for weakness. Negative for dizziness, tingling, tremors, sensory change, speech change, focal weakness and loss of  consciousness.  Endo/Heme/Allergies:   Does not bruise/bleed easily.  Psychiatric/Behavioral:  Negative for substance abuse. The patient is not nervous/anxious.   All other systems reviewed and are negative.     Physical Exam/Data:   Vitals:   09/17/22 0232 09/17/22 0534 09/17/22 0713 09/17/22 0726  BP: (!) 127/90 128/87  103/83  Pulse: 97 67 (!) 161   Resp: 18 18 (!) 32   Temp: 98.6 F (37 C) 98.5 F (36.9 C)    TempSrc: Oral Oral    SpO2: 97% 98% 99%   Weight:      Height:       No intake or output data in the 24 hours ending 09/17/22 0811 Filed Weights   09/16/22 2014  Weight: 54.4 kg   Body mass index is 21.26 kg/m.   Physical Exam: General: Well developed, well nourished, in no acute distress. Head: Normocephalic, atraumatic, sclera non-icteric, no xanthomas, nares without discharge.  Neck: Negative for carotid bruits. JVD elevated ~ 8 cm. Lungs: Diminished breath sounds along the bilateral bases. Breathing is unlabored. Heart: RRR with S1 S2. III/VI systolic murmur LSB, no rubs, or gallops appreciated. Abdomen: Soft, non-tender, mildly distended with normoactive bowel sounds. No hepatomegaly. No rebound/guarding. No obvious abdominal masses. Msk:  Strength and tone appear normal for age. Extremities: No clubbing or cyanosis. No edema. Distal pedal pulses are 2+ and equal bilaterally. Neuro: Alert and oriented X 3. No facial asymmetry. No focal deficit. Moves all extremities spontaneously. Psych:  Responds to questions appropriately with a normal affect.   EKG:  The EKG was personally reviewed and demonstrates: Initial EKG on 09/16/2022 at 1326 sinus tachycardia, 107 bpm, left posterior fascicular block, and pleat RBBB, possible prior septal infarct, inferior T wave inversion.  Repeat EKG post conversion with adenosine on 09/17/2022 at 08 27 showed sinus rhythm, 86 bpm, incomplete RBBB, and improved inferior T wave changes Telemetry:  Telemetry was personally reviewed and demonstrates: Sinus rhythm  transitioning to SVT at 7:09 AM this morning with a rate of 160 bpm, currently in sinus rhythm with rates in the 80s to 90s bpm following adenosine push as outlined below, rare PVCs and PACs  Weights: Filed Weights   09/16/22 2014  Weight: 54.4 kg    Relevant CV Studies:  2D echo 08/22/2022: 1. Left ventricular ejection fraction, by estimation, is 30 to 35%. The  left ventricle has moderately decreased function. The left ventricle  demonstrates global hypokinesis. There is mild left ventricular  hypertrophy. Left ventricular diastolic  parameters are indeterminate.   2. Right ventricular systolic function is severely reduced. The right  ventricular size is moderately enlarged. Tricuspid regurgitation signal is  inadequate for assessing PA pressure.   3. Right atrial size was moderately dilated.   4. The mitral valve is normal in structure. Severe mitral valve  regurgitation. No evidence of mitral stenosis.   5. Tricuspid valve regurgitation is severe.   6. The aortic valve is normal in structure. Aortic valve regurgitation is  mild. Aortic valve sclerosis/calcification is present, without any  evidence of aortic stenosis.   7. The inferior vena cava is normal in size with greater than 50%  respiratory variability, suggesting right atrial pressure of 3 mmHg. __________  Elwyn Reach patch 09/2021: Patient had a min HR of 66 bpm, max HR of 207 bpm, and avg HR of 94 bpm. Predominant underlying rhythm was Sinus Rhythm. 2 Ventricular Tachycardia runs occurred, the run with the fastest interval lasting 11 beats with a max rate  of 182 bpm (avg 175 bpm);  the run with the fastest interval was also the longest. 20 Supraventricular Tachycardia runs occurred, the run with the fastest interval lasting 53 mins 7 secs with a max rate of 207 bpm (avg 174 bpm); the run with the fastest interval was also the longest.  No atrial fibrillation, high degree block, or pauses noted. Isolated atrial and ventricular  ectopy was rare to occasional (~1%). There were 5 triggered events. These events were sinus with SVT, PACs, and PVCs. __________  2D echo 02/07/2021: 1. Left ventricular ejection fraction, by estimation, is 40 to 45%. Left  ventricular ejection fraction by 3D volume is 47 %. The left ventricle has  mildly decreased function. The left ventricle demonstrates global  hypokinesis. Left ventricular diastolic   parameters are consistent with Grade II diastolic dysfunction  (pseudonormalization). Elevated left ventricular end-diastolic pressure.   2. Right ventricular systolic function is severely reduced. The right  ventricular size is mildly enlarged. There is severely elevated pulmonary  artery systolic pressure. The estimated right ventricular systolic  pressure is 94.7 mmHg.   3. Left atrial size was moderately dilated.   4. Right atrial size was severely dilated.   5. The mitral valve is degenerative with mild to moderate thickening of  the mitral valve leaflets. There is teathering of the MV leaflets due to  mild LV dysfunction. Severe mitral valve regurgitation. Mild to moderate  mitral stenosis. The mean mitral  valve gradient is 6.5 mmHg.   6. Tricuspid valve regurgitation is moderate to severe.   7. The aortic valve is tricuspid. Aortic valve regurgitation is mild.  Mild aortic valve sclerosis is present, with no evidence of aortic valve  stenosis. Aortic regurgitation PHT measures 499 msec.   8. The inferior vena cava is dilated in size with <50% respiratory  variability, suggesting right atrial pressure of 15 mmHg.   9. There is a trivial pericardial effusion posterior to the left  ventricle.  10. Compared to prior echo, Mitral regurgitation appears worse and RV  dysfunction is now present. There is severe Pulmonary HTN. __________  [facility] 03/23/2020: Hemodynamics: LV end diastolic pressure is moderately elevated. ----Coronary Angiography----- Ost RCA to Dist RCA lesion is 100%  stenosed.-The PDA and PL system fills via faint collaterals from the AV groove LCx and LAD septals. Mid LAD-1 lesion is 60% stenosed. Mid LAD-2 lesion is 50% stenosed. Dist LAD lesion is 55% stenosed with 70% stenosed side branch in 2nd Diag. 1st Diag lesion is 70% stenosed. Ost Cx to Prox Cx lesion is 45% stenosed. Prox-MID Cx lesion is 65% stenosed.   MODERATE-SEVERE THREE-VESSEL CAD: 100% proximal RCA occlusion with left-to-right collaterals faintly filling PDA and PL system (AV groove LCx-PL and LAD septal-PDA) Diffuse moderate mid LAD disease with 60% to 50% stenosis. Tandem 50% and 65% proximal and mid LCx with the 65% lesion being the most significant lesion. Moderately elevated LVEDP of 18-20 mmHg   Given the extent of disease in the LAD and LCx, neither lesion is very amenable to PCI, and RCA is chronically occluded.  Given her comorbidities, I do not think she would be a good CABG candidate especially in light of the fact that she would likely require valve surgery as well.  She would not recover well.   Recommendations aggressive medical management. __________  2D echo 03/20/2020: 1. Left ventricular ejection fraction, by estimation, is 40 to 45%. The  left ventricle has mildly decreased function. The left ventricle  demonstrates global  hypokinesis. The left ventricular internal cavity size  was mildly dilated. Left ventricular  diastolic function could not be evaluated.   2. Right ventricular systolic function is normal. The right ventricular  size is normal. There is moderately elevated pulmonary artery systolic  pressure.   3. Left atrial size was mildly dilated.   4. Moderate mitral subvalvular thickening/fibrosis.   5. The mitral valve is rheumatic. Moderate to severe mitral valve  regurgitation. Mild mitral stenosis. The mean mitral valve gradient is 7.3  mmHg with average heart rate of 105 bpm.   6. The aortic valve is normal in structure. Aortic valve regurgitation is   not visualized.   7. The inferior vena cava is dilated in size with <50% respiratory  variability, suggesting right atrial pressure of 15 mmHg.   Conclusion(s)/Recommendation(s): Consider TEE for better mitral  regurgitation evaluation.   Laboratory Data:  Chemistry Recent Labs  Lab 09/16/22 1329 09/17/22 0529  NA 139 137  K 3.2* 3.0*  CL 104 107  CO2 24 22  GLUCOSE 95 80  BUN 32* 30*  CREATININE 1.65* 1.48*  CALCIUM 8.9 7.9*  GFRNONAA 36* 41*  ANIONGAP 11 8    Recent Labs  Lab 09/16/22 1329  PROT 7.9  ALBUMIN 3.6  AST 24  ALT 15  ALKPHOS 115  BILITOT 1.2   Hematology Recent Labs  Lab 09/16/22 1329  WBC 6.3  RBC 3.86*  HGB 10.6*  HCT 34.5*  MCV 89.4  MCH 27.5  MCHC 30.7  RDW 18.2*  PLT 231   Cardiac EnzymesNo results for input(s): "TROPONINI" in the last 168 hours. No results for input(s): "TROPIPOC" in the last 168 hours.  BNP Recent Labs  Lab 09/16/22 1329  BNP 4,403.2*    DDimer No results for input(s): "DDIMER" in the last 168 hours.  Radiology/Studies:  CT Angio Chest PE W/Cm &/Or Wo Cm  Result Date: 09/16/2022 IMPRESSION: 1. No pulmonary embolus. 2. Moderate cardiomegaly with marked atrial dilatation. Contrast refluxes into the hepatic veins, consistent with elevated right heart pressures. 3. Small to moderate right and minimal left pleural effusions. Mild pulmonary edema. 4. Upper abdominal ascites.  Body wall edema. Aortic Atherosclerosis (ICD10-I70.0) and Emphysema (ICD10-J43.9). Electronically Signed   By: Keith Rake M.D.   On: 09/16/2022 16:09   CT HEAD WO CONTRAST (5MM)  Result Date: 09/16/2022 IMPRESSION: No evidence of acute intracranial abnormality. Electronically Signed   By: Margaretha Sheffield M.D.   On: 09/16/2022 16:04    Assessment and Plan:   1.  Paroxysmal SVT: -Episode of presumed SVT last week at home which, per prior documentation, improved with metoprolol.  Found to be back in SVT at PCPs office yesterday,  converting with adenosine 6-12 by EMS prior to hospital admission -Maintaining sinus rhythm upon arrival to the ED with recurrence of SVT on the morning of 10/11 -On low-dose diltiazem drip upon consultation with BP precluding titration -Patient was placed on cardiac monitor and pacer pads -She was given 6 mg of adenosine rapid IV push followed by normal saline flush without symptoms or conversion/improvement in rate at 8:18 AM -She received 12 mg of adenosine rapid IV push followed by normal saline flush at 8:20 AM with conversion to sinus rhythm at 8:21 AM without significant posttermination pause -She remained hemodynamically stable -Repeat EKG in sinus rhythm showed no evidence of acute MI -She received carvedilol 3.25 mg this morning in the ED, will discontinue carvedilol -Start metoprolol tartrate 12.5 mg twice daily this afternoon for  added ventricular rate control with recommendation to transition back to PTA metoprolol succinate prior to discharge as long as ventricular rates allow given her underlying cardiomyopathy -Replete potassium and magnesium to goal as outlined below -Given recurrent, symptomatic SVT, may benefit from EP evaluation in the office  2.  Acute on chronic combined systolic and diastolic CHF: -Etiology likely mixed including ischemic cardiomyopathy and nonischemic in the context of her mitral valve disease -As outlined below, options regarding her mitral valve are limited and she has not been inclined to pursue surgical intervention -Volume up, possibly in the context of increased atrial arrhythmia burden -IV Lasix 40 mg twice daily with close monitoring of urine output and renal function -No longer on Entresto secondary to hypotension -Metoprolol as outlined above -Relative hypotension precludes escalation of GDMT at this time -Recent echo demonstrated EF of 30 to 35% last month -CHF education -Daily weights -Strict I's and O's  3.  CAD involving the native  coronary arteries without angina: -No symptoms of chest pain -PTA not on aspirin or statin secondary to underlying anemia and transaminitis -No plans for inpatient ischemic evaluation at this time  4.  Hypokalemia/hypomagnesemia: -Electrolyte derangements likely contributing to atrial arrhythmia -Despite 60 mEq of KCl on 10/10, her hypokalemia persists, will repeat order -Check magnesium -Recommend repletion of potassium and magnesium to goal 4.0 and 2.0, respectively  5.  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   Signed, Christell Faith, PA-C [place] Pager: [phone] 09/17/2022, 8:11 AM

## 2022-09-17 NOTE — Progress Notes (Signed)
Pt received from the ED. She is alert and oriented x4. VSS. Denies pain. CCMD called and verified.   09/17/22 1857  Vitals  Temp 97.6 F (36.4 C)  BP 100/80  MAP (mmHg) 87  BP Location Left Arm  BP Method Automatic  Patient Position (if appropriate) Lying  Pulse Rate 88  Pulse Rate Source Monitor  Resp 20  MEWS COLOR  MEWS Score Color Green  Oxygen Therapy  SpO2 99 %  O2 Device Room Air  Pain Assessment  Pain Scale 0-10  Pain Score 0

## 2022-09-17 NOTE — Progress Notes (Signed)
PROGRESS NOTE    Anita [name]  XNT:[id number] DOB: 1961-04-26 DOA: 09/16/2022 PCP: [name], NP    Brief Narrative:  61 year old female with past medical history of multiple sclerosis and systolic/diastolic heart failure with moderate to severe tricuspid regurg and severe mitral regurg. Presents to ED via EMS with SVT, sent by PCP office where had ECG with SVT, rate of 173.  EMS gave her 6 mg and then 12 mg of adenosine and arrived at the ER in sinus tachycardia.  10/11 had another episode of SVT this am. Was given adenosine. Again episode later today, was started on iv amiodarone after adenosine given.   Consultants:  Cardiology   Procedures:   Antimicrobials:      Subjective: Feels tired. Denies sob, dizziness, cp  Objective: Vitals:   09/17/22 1600 09/17/22 1615 09/17/22 1630 09/17/22 1700  BP: (!) 89/66  94/73 104/75  Pulse: 73 74 74 74  Resp: (!) 25 (!) '23 19 19  '$ Temp:   98.2 F (36.8 C)   TempSrc:   Oral   SpO2: 100% 100% 100% 100%  Weight:      Height:        Intake/Output Summary (Last 24 hours) at 09/17/2022 1739 Last data filed at 09/17/2022 1554 Gross per 24 hour  Intake 14.27 ml  Output 800 ml  Net -785.73 ml   Filed Weights   09/16/22 2014  Weight: 54.4 kg    Examination:  Calm, NAD Cta no w/r Reg s1/s2 no gallop Soft benign +bs No edema Aaoxox3  Mood and affect appropriate in current setting     Data Reviewed: I have personally reviewed following labs and imaging studies  CBC: Recent Labs  Lab 09/16/22 1329  WBC 6.3  NEUTROABS 4.9  HGB 10.6*  HCT 34.5*  MCV 89.4  PLT 967   Basic Metabolic Panel: Recent Labs  Lab 09/16/22 1329 09/17/22 0529  NA 139 137  K 3.2* 3.0*  CL 104 107  CO2 24 22  GLUCOSE 95 80  BUN 32* 30*  CREATININE 1.65* 1.48*  CALCIUM 8.9 7.9*  MG 1.6* 1.9   GFR: Estimated Creatinine Clearance: 33.9 mL/min (A) (by C-G formula based on SCr of 1.48 mg/dL (H)). Liver Function  Tests: Recent Labs  Lab 09/16/22 1329  AST 24  ALT 15  ALKPHOS 115  BILITOT 1.2  PROT 7.9  ALBUMIN 3.6   No results for input(s): "LIPASE", "AMYLASE" in the last 168 hours. No results for input(s): "AMMONIA" in the last 168 hours. Coagulation Profile: No results for input(s): "INR", "PROTIME" in the last 168 hours. Cardiac Enzymes: No results for input(s): "CKTOTAL", "CKMB", "CKMBINDEX", "TROPONINI" in the last 168 hours. BNP (last 3 results) No results for input(s): "PROBNP" in the last 8760 hours. HbA1C: No results for input(s): "HGBA1C" in the last 72 hours. CBG: No results for input(s): "GLUCAP" in the last 168 hours. Lipid Profile: No results for input(s): "CHOL", "HDL", "LDLCALC", "TRIG", "CHOLHDL", "LDLDIRECT" in the last 72 hours. Thyroid Function Tests: No results for input(s): "TSH", "T4TOTAL", "FREET4", "T3FREE", "THYROIDAB" in the last 72 hours. Anemia Panel: No results for input(s): "VITAMINB12", "FOLATE", "FERRITIN", "TIBC", "IRON", "RETICCTPCT" in the last 72 hours. Sepsis Labs: No results for input(s): "PROCALCITON", "LATICACIDVEN" in the last 168 hours.  Recent Results (from the past 240 hour(s))  SARS Coronavirus 2 by RT PCR (hospital order, performed in [facility] hospital lab) *cepheid single result test* Anterior Nasal Swab     Status: None  Collection Time: 09/17/22 10:54 AM   Specimen: Anterior Nasal Swab  Result Value Ref Range Status   SARS Coronavirus 2 by RT PCR NEGATIVE NEGATIVE Final    Comment: (NOTE) SARS-CoV-2 target nucleic acids are NOT DETECTED.  The SARS-CoV-2 RNA is generally detectable in upper and lower respiratory specimens during the acute phase of infection. The lowest concentration of SARS-CoV-2 viral copies this assay can detect is 250 copies / mL. A negative result does not preclude SARS-CoV-2 infection and should not be used as the sole basis for treatment or other patient management decisions.  A negative result may occur  with improper specimen collection / handling, submission of specimen other than nasopharyngeal swab, presence of viral mutation(s) within the areas targeted by this assay, and inadequate number of viral copies (<250 copies / mL). A negative result must be combined with clinical observations, patient history, and epidemiological information.  Fact Sheet for Patients:   https://www.patel.info/  Fact Sheet for Healthcare Providers: https://hall.com/  This test is not yet approved or  cleared by the [place] FDA and has been authorized for detection and/or diagnosis of SARS-CoV-2 by FDA under an Emergency Use Authorization (EUA).  This EUA will remain in effect (meaning this test can be used) for the duration of the COVID-19 declaration under Section 564(b)(1) of the Act, 21 U.S.C. section 360bbb-3(b)(1), unless the authorization is terminated or revoked sooner.  Performed at [facility], [address], [place], [place] [address]          Radiology Studies: CT Angio Chest PE W/Cm &/Or Wo Cm  Result Date: 09/16/2022 CLINICAL DATA:  Pulmonary embolism (PE) suspected, high prob SVT today. EXAM: CT ANGIOGRAPHY CHEST WITH CONTRAST TECHNIQUE: Multidetector CT imaging of the chest was performed using the standard protocol during bolus administration of intravenous contrast. Multiplanar CT image reconstructions and MIPs were obtained to evaluate the vascular anatomy. RADIATION DOSE REDUCTION: This exam was performed according to the departmental dose-optimization program which includes automated exposure control, adjustment of the mA and/or kV according to patient size and/or use of iterative reconstruction technique. CONTRAST:  35m OMNIPAQUE IOHEXOL 350 MG/ML SOLN COMPARISON:  Chest radiograph 08/21/2022, CT 03/20/2020 FINDINGS: Cardiovascular: There are no filling defects within the pulmonary arteries to suggest pulmonary embolus.  Moderate cardiomegaly. There is marked atrial dilatation. Contrast refluxes into the hepatic veins. Coronary artery calcifications. Atherosclerosis of the thoracic aorta without aneurysm. Cannot assess for dissection given phase of contrast. Small pericardial effusion with fluid in the superior pericardial recess. Mediastinum/Nodes: Scattered small mediastinal lymph nodes, not enlarged by size criteria. There is no hilar adenopathy. No esophageal wall thickening. Lungs/Pleura: Small to moderate right and minimal left pleural effusion. Mild smooth septal thickening suggestive of pulmonary edema. Mild upper lobe emphysema. No pneumonia or focal airspace disease. No suspicious pulmonary nodule. Trachea and central bronchi are patent. Upper Abdomen: Upper abdominal ascites. Chronic left retroperitoneal like densities spans 6 cm, unchanged in present over multiple prior exams. There are surgical clips about the inferior aspect of this lesion. Musculoskeletal: Thoracic spondylosis with spurring. There are no acute or suspicious osseous abnormalities. Mild body wall edema. Review of the MIP images confirms the above findings. IMPRESSION: 1. No pulmonary embolus. 2. Moderate cardiomegaly with marked atrial dilatation. Contrast refluxes into the hepatic veins, consistent with elevated right heart pressures. 3. Small to moderate right and minimal left pleural effusions. Mild pulmonary edema. 4. Upper abdominal ascites.  Body wall edema. Aortic Atherosclerosis (ICD10-I70.0) and Emphysema (ICD10-J43.9). Electronically Signed  By: Keith Rake M.D.   On: 09/16/2022 16:09   CT HEAD WO CONTRAST (5MM)  Result Date: 09/16/2022 CLINICAL DATA:  Headache, new or worsening (Age >= 50y) EXAM: CT HEAD WITHOUT CONTRAST TECHNIQUE: Contiguous axial images were obtained from the base of the skull through the vertex without intravenous contrast. RADIATION DOSE REDUCTION: This exam was performed according to the departmental  dose-optimization program which includes automated exposure control, adjustment of the mA and/or kV according to patient size and/or use of iterative reconstruction technique. COMPARISON:  MRI head September 16, 2022. FINDINGS: Brain: No evidence of acute infarction, hemorrhage, hydrocephalus, extra-axial collection or mass lesion/mass effect. Patchy white matter hypoattenuation, nonspecific but compatible with chronic microvascular disease. Remote perforator infarct in the right basal ganglia. Cerebral atrophy. Vascular: No hyperdense vessel identified. Skull: No acute fracture. Sinuses/Orbits: Visualized sinuses are largely clear. No acute orbital findings. Other: No mastoid effusions. IMPRESSION: No evidence of acute intracranial abnormality. Electronically Signed   By: Margaretha Sheffield M.D.   On: 09/16/2022 16:04        Scheduled Meds:  aspirin EC  81 mg Oral Daily   atorvastatin  20 mg Oral QHS   DULoxetine  20 mg Oral Daily   enoxaparin (LOVENOX) injection  40 mg Subcutaneous Q24H   feeding supplement  237 mL Oral BID BM   ferrous sulfate  325 mg Oral BID WC   fluticasone furoate-vilanterol  1 puff Inhalation Daily   loratadine  10 mg Oral Daily   metoprolol tartrate  25 mg Oral BID   pantoprazole  40 mg Oral Daily   sodium chloride flush  3 mL Intravenous Q12H   [START ON 09/18/2022] torsemide  20 mg Oral Daily   Continuous Infusions:  sodium chloride     amiodarone Stopped (09/17/22 1530)   Followed by   amiodarone     diltiazem (CARDIZEM) infusion Stopped (09/17/22 0815)    Assessment & Plan:   Principal Problem:   Acute on chronic combined systolic and diastolic CHF (congestive heart failure) (HCC) Active Problems:   COPD (chronic obstructive pulmonary disease) (HCC)   Multiple sclerosis (HCC)   Coronary artery disease involving native coronary artery of native heart without angina pectoris   Normocytic anemia   Pulmonary hypertension (HCC)   Ischemic cardiomyopathy    Mitral valve insufficiency   Hypokalemia   Hypervolemia   Cardiorenal syndrome   SVT (supraventricular tachycardia)   Stage 3b chronic kidney disease (HCC)   HFrEF (heart failure with reduced ejection fraction) (HCC)   Acute on chronic combined systolic and diastolic CHF (congestive heart failure) ([place]) Coronary artery disease involving native coronary artery of native heart without angina pectoris Pulmonary hypertension ([place]) Ischemic cardiomyopathy Mitral valve insufficiency Hypervolemia -cardiology consulted EF 30 to 35% Appears mildly volume overloaded. On torsemide  Bnp elevated Lopressor for now Toprol-XL on discharge   SVT (supraventricular tachycardia) Had couple of more episodes.  Broke initially with adenosine on IV amiodarone Cardio allergy following   Hypokalemia We will replete and monitor  Stage 3b chronic kidney disease (HCC) Cardiorenal syndrome Creatinine down.  Monitor closely   COPD (chronic obstructive pulmonary disease) ([place]) Does not appear to be in acute exacerbation Continue home meds    Multiple sclerosis ([place]) Stable at this time.  Not on any medications.   Normocytic anemia IDA (iron deficiency anemia) Monitor periodic CBC follow outpatient    Tobacco abuse Have provided counseling.  Nicotine patch.    DVT prophylaxis: Lovenox Code Status: DNR Family  Communication: None at bedside Disposition Plan:  Status is: Inpatient Remains inpatient appropriate because: IV treatment.        LOS: 1 day   Time spent: 55 min    Nolberto Hanlon, MD [facility] Pager [phone]  If 7PM-7AM, please contact night-coverage 09/17/2022, 5:39 PM

## 2022-09-17 NOTE — ED Notes (Addendum)
Pt brought from [place] and roomed into ED room 3. This RN resuming care. Report received pt was initially in SVT and converted and went back into SVT at approximately 1430. After ordered medication given pt vomited, had BM and BP dropped with MAP of 30s. Pt moved to main side for close monitoring. Pt remains on Zoll at this time.   Pt reports to this RN no CP but does endorse SOB. Pt placed on 2L [place] for comofrt. Pt cleaned of BM and new purewick in place.

## 2022-09-17 NOTE — ED Notes (Addendum)
This RN to bedside for monitoring. Pt reporting BM. Supplies retrieved to clean pt and back to bedside. At this time, pt became nauseated and began to throw up. Emesis bag provided. Upon checking vital signs, BP 62/26 (33). Previous VO from cardiology to start 274m NS, starting bolus at this time. Primary RN and CAgricultural consultantnotified, charge RN to bedside at this time. Will move pt to room 3.

## 2022-09-17 NOTE — Plan of Care (Signed)
@   shift change noted that patient was sustaining in mid-160s. Other wise asymptomatic  - though patient was aware of increased HR, since could hear HR "thumping" in ear  - just as she did (when coming to the hospital).  No chest pain currently.  Beginning Cardizem drip and EKG completed.  Dr. Informed and Cardiac also being consulted. Gordy Councilman taking over RN care - since beginning on titratable Cardizem drip.

## 2022-09-17 NOTE — Progress Notes (Signed)
Was called by RN at bedside due to another episode of SVT.  Telemetry mornitor upon my arrival showed SVT heart rate 170s.  Patient given adenosine 6 mg x 1, then 12 mg, then another 12.  IV amiodarone bolus with drip was started.  Monitor for about 5 minutes, heart rates persistently stayed in the 160s.  Rhythm was SVT.  5 mg IV Lopressor given.  Heart rates improved, sinus rhythm noted with heart rates in the 80s.  Recommend to continue amiodarone drip per protocol.  Okay to give IV fluid boluses up 250 cc to keep MAP over 60.  Care should be taken to avoid volume overload situation, has EF is 30 to 35%  Signed, Kate Sable, M.D. 09/17/22 [place], [place]

## 2022-09-17 NOTE — ED Notes (Signed)
Dr. Mylo Red to bedside at this time for medication admin

## 2022-09-17 NOTE — ED Notes (Signed)
Dr Mylo Red and Mr Idolina Primer PA contacted via secure chat of HR 175, awaiting orders

## 2022-09-17 NOTE — ED Notes (Signed)
Pt has a hr of 175, Dunn PA and Dr

## 2022-09-18 DIAGNOSIS — E8779 Other fluid overload: Secondary | ICD-10-CM

## 2022-09-18 DIAGNOSIS — I130 Hypertensive heart and chronic kidney disease with heart failure and stage 1 through stage 4 chronic kidney disease, or unspecified chronic kidney disease: Secondary | ICD-10-CM

## 2022-09-18 DIAGNOSIS — D649 Anemia, unspecified: Secondary | ICD-10-CM

## 2022-09-18 DIAGNOSIS — I5043 Acute on chronic combined systolic (congestive) and diastolic (congestive) heart failure: Secondary | ICD-10-CM | POA: Diagnosis not present

## 2022-09-18 DIAGNOSIS — J432 Centrilobular emphysema: Secondary | ICD-10-CM | POA: Diagnosis not present

## 2022-09-18 DIAGNOSIS — R531 Weakness: Secondary | ICD-10-CM

## 2022-09-18 DIAGNOSIS — I471 Supraventricular tachycardia, unspecified: Secondary | ICD-10-CM | POA: Diagnosis not present

## 2022-09-18 LAB — BASIC METABOLIC PANEL
Anion gap: 9 (ref 5–15)
BUN: 36 mg/dL — ABNORMAL HIGH (ref 6–20)
CO2: 22 mmol/L (ref 22–32)
Calcium: 8.8 mg/dL — ABNORMAL LOW (ref 8.9–10.3)
Chloride: 107 mmol/L (ref 98–111)
Creatinine, Ser: 1.91 mg/dL — ABNORMAL HIGH (ref 0.44–1.00)
GFR, Estimated: 30 mL/min — ABNORMAL LOW (ref >60)
Glucose, Bld: 102 mg/dL — ABNORMAL HIGH (ref 70–99)
Potassium: 4.3 mmol/L (ref 3.5–5.1)
Sodium: 138 mmol/L (ref 135–145)

## 2022-09-18 MED ORDER — ENOXAPARIN SODIUM 30 MG/0.3ML IJ SOSY
30.0000 mg | PREFILLED_SYRINGE | INTRAMUSCULAR | Status: DC
  Administered 2022-09-18: 30 mg via SUBCUTANEOUS
  Filled 2022-09-18: qty 0.3

## 2022-09-18 MED ORDER — AMIODARONE HCL 200 MG PO TABS
400.0000 mg | ORAL_TABLET | Freq: Two times a day (BID) | ORAL | Status: DC
  Administered 2022-09-18 – 2022-09-19 (×3): 400 mg via ORAL
  Filled 2022-09-18 (×3): qty 2

## 2022-09-18 MED ORDER — METOPROLOL SUCCINATE ER 25 MG PO TB24
25.0000 mg | ORAL_TABLET | Freq: Every day | ORAL | Status: DC
  Administered 2022-09-18 – 2022-09-19 (×2): 25 mg via ORAL
  Filled 2022-09-18 (×2): qty 1

## 2022-09-18 NOTE — Progress Notes (Signed)
PROGRESS NOTE    Anita [name]  YHC:[id number] DOB: [birth year] DOA: 09/16/2022 PCP: [name], NP    Brief Narrative:  61 year old female with past medical history of multiple sclerosis and systolic/diastolic heart failure with moderate to severe tricuspid regurg and severe mitral regurg. Presents to ED via EMS with SVT, sent by PCP office where had ECG with SVT, rate of 173.  EMS gave her 6 mg and then 12 mg of adenosine and arrived at the ER in sinus tachycardia.  10/11 had another episode of SVT this am. Was given adenosine. Again episode later today, was started on iv amiodarone after adenosine given.  10/12 no significant SVT  Consultants:  Cardiology   Procedures:   Antimicrobials:      Subjective: Feels tired.  Denies chest pain, dizziness, or any other symptoms. Does report nausea  Objective: Vitals:   09/17/22 2330 09/18/22 0419 09/18/22 0420 09/18/22 0750  BP: 95/71 97/75  117/80  Pulse: 91 86  82  Resp: '18 18  18  '$ Temp: 98.3 F (36.8 C) 97.8 F (36.6 C)  98.4 F (36.9 C)  TempSrc:    Oral  SpO2: 97% 97%  96%  Weight:   57.5 kg   Height:        Intake/Output Summary (Last 24 hours) at 09/18/2022 0833 Last data filed at 09/18/2022 0000 Gross per 24 hour  Intake 241.45 ml  Output --  Net 241.45 ml   Filed Weights   09/16/22 2014 09/18/22 0420  Weight: 54.4 kg 57.5 kg    Examination: Calm, NAD Cta no w/r Reg s1/s2 no gallop Soft benign +bs, mild distention, pt reports it better No edema Aaoxox3  Mood and affect appropriate in current setting     Data Reviewed: I have personally reviewed following labs and imaging studies  CBC: Recent Labs  Lab 09/16/22 1329  WBC 6.3  NEUTROABS 4.9  HGB 10.6*  HCT 34.5*  MCV 89.4  PLT 517   Basic Metabolic Panel: Recent Labs  Lab 09/16/22 1329 09/17/22 0529 09/18/22 0402  NA 139 137 138  K 3.2* 3.0* 4.3  CL 104 107 107  CO2 '24 22 22  '$ GLUCOSE 95 80 102*  BUN 32* 30* 36*   CREATININE 1.65* 1.48* 1.91*  CALCIUM 8.9 7.9* 8.8*  MG 1.6* 1.9  --    GFR: Estimated Creatinine Clearance: 26.2 mL/min (A) (by C-G formula based on SCr of 1.91 mg/dL (H)). Liver Function Tests: Recent Labs  Lab 09/16/22 1329  AST 24  ALT 15  ALKPHOS 115  BILITOT 1.2  PROT 7.9  ALBUMIN 3.6   No results for input(s): "LIPASE", "AMYLASE" in the last 168 hours. No results for input(s): "AMMONIA" in the last 168 hours. Coagulation Profile: No results for input(s): "INR", "PROTIME" in the last 168 hours. Cardiac Enzymes: No results for input(s): "CKTOTAL", "CKMB", "CKMBINDEX", "TROPONINI" in the last 168 hours. BNP (last 3 results) No results for input(s): "PROBNP" in the last 8760 hours. HbA1C: No results for input(s): "HGBA1C" in the last 72 hours. CBG: No results for input(s): "GLUCAP" in the last 168 hours. Lipid Profile: No results for input(s): "CHOL", "HDL", "LDLCALC", "TRIG", "CHOLHDL", "LDLDIRECT" in the last 72 hours. Thyroid Function Tests: No results for input(s): "TSH", "T4TOTAL", "FREET4", "T3FREE", "THYROIDAB" in the last 72 hours. Anemia Panel: No results for input(s): "VITAMINB12", "FOLATE", "FERRITIN", "TIBC", "IRON", "RETICCTPCT" in the last 72 hours. Sepsis Labs: No results for input(s): "PROCALCITON", "LATICACIDVEN" in the last 168 hours.  Recent Results (from the past 240 hour(s))  SARS Coronavirus 2 by RT PCR (hospital order, performed in [facility] hospital lab) *cepheid single result test* Anterior Nasal Swab     Status: None   Collection Time: 09/17/22 10:54 AM   Specimen: Anterior Nasal Swab  Result Value Ref Range Status   SARS Coronavirus 2 by RT PCR NEGATIVE NEGATIVE Final    Comment: (NOTE) SARS-CoV-2 target nucleic acids are NOT DETECTED.  The SARS-CoV-2 RNA is generally detectable in upper and lower respiratory specimens during the acute phase of infection. The lowest concentration of SARS-CoV-2 viral copies this assay can detect is  250 copies / mL. A negative result does not preclude SARS-CoV-2 infection and should not be used as the sole basis for treatment or other patient management decisions.  A negative result may occur with improper specimen collection / handling, submission of specimen other than nasopharyngeal swab, presence of viral mutation(s) within the areas targeted by this assay, and inadequate number of viral copies (<250 copies / mL). A negative result must be combined with clinical observations, patient history, and epidemiological information.  Fact Sheet for Patients:   https://www.patel.info/  Fact Sheet for Healthcare Providers: https://hall.com/  This test is not yet approved or  cleared by the [place] FDA and has been authorized for detection and/or diagnosis of SARS-CoV-2 by FDA under an Emergency Use Authorization (EUA).  This EUA will remain in effect (meaning this test can be used) for the duration of the COVID-19 declaration under Section 564(b)(1) of the Act, 21 U.S.C. section 360bbb-3(b)(1), unless the authorization is terminated or revoked sooner.  Performed at [facility], [address], [place], [place] [address]          Radiology Studies: CT Angio Chest PE W/Cm &/Or Wo Cm  Result Date: 09/16/2022 CLINICAL DATA:  Pulmonary embolism (PE) suspected, high prob SVT today. EXAM: CT ANGIOGRAPHY CHEST WITH CONTRAST TECHNIQUE: Multidetector CT imaging of the chest was performed using the standard protocol during bolus administration of intravenous contrast. Multiplanar CT image reconstructions and MIPs were obtained to evaluate the vascular anatomy. RADIATION DOSE REDUCTION: This exam was performed according to the departmental dose-optimization program which includes automated exposure control, adjustment of the mA and/or kV according to patient size and/or use of iterative reconstruction technique. CONTRAST:  60m  OMNIPAQUE IOHEXOL 350 MG/ML SOLN COMPARISON:  Chest radiograph 08/21/2022, CT 03/20/2020 FINDINGS: Cardiovascular: There are no filling defects within the pulmonary arteries to suggest pulmonary embolus. Moderate cardiomegaly. There is marked atrial dilatation. Contrast refluxes into the hepatic veins. Coronary artery calcifications. Atherosclerosis of the thoracic aorta without aneurysm. Cannot assess for dissection given phase of contrast. Small pericardial effusion with fluid in the superior pericardial recess. Mediastinum/Nodes: Scattered small mediastinal lymph nodes, not enlarged by size criteria. There is no hilar adenopathy. No esophageal wall thickening. Lungs/Pleura: Small to moderate right and minimal left pleural effusion. Mild smooth septal thickening suggestive of pulmonary edema. Mild upper lobe emphysema. No pneumonia or focal airspace disease. No suspicious pulmonary nodule. Trachea and central bronchi are patent. Upper Abdomen: Upper abdominal ascites. Chronic left retroperitoneal like densities spans 6 cm, unchanged in present over multiple prior exams. There are surgical clips about the inferior aspect of this lesion. Musculoskeletal: Thoracic spondylosis with spurring. There are no acute or suspicious osseous abnormalities. Mild body wall edema. Review of the MIP images confirms the above findings. IMPRESSION: 1. No pulmonary embolus. 2. Moderate cardiomegaly with marked atrial dilatation. Contrast refluxes into the hepatic  veins, consistent with elevated right heart pressures. 3. Small to moderate right and minimal left pleural effusions. Mild pulmonary edema. 4. Upper abdominal ascites.  Body wall edema. Aortic Atherosclerosis (ICD10-I70.0) and Emphysema (ICD10-J43.9). Electronically Signed   By: Keith Rake M.D.   On: 09/16/2022 16:09   CT HEAD WO CONTRAST (5MM)  Result Date: 09/16/2022 CLINICAL DATA:  Headache, new or worsening (Age >= 50y) EXAM: CT HEAD WITHOUT CONTRAST TECHNIQUE:  Contiguous axial images were obtained from the base of the skull through the vertex without intravenous contrast. RADIATION DOSE REDUCTION: This exam was performed according to the departmental dose-optimization program which includes automated exposure control, adjustment of the mA and/or kV according to patient size and/or use of iterative reconstruction technique. COMPARISON:  MRI head September 16, 2022. FINDINGS: Brain: No evidence of acute infarction, hemorrhage, hydrocephalus, extra-axial collection or mass lesion/mass effect. Patchy white matter hypoattenuation, nonspecific but compatible with chronic microvascular disease. Remote perforator infarct in the right basal ganglia. Cerebral atrophy. Vascular: No hyperdense vessel identified. Skull: No acute fracture. Sinuses/Orbits: Visualized sinuses are largely clear. No acute orbital findings. Other: No mastoid effusions. IMPRESSION: No evidence of acute intracranial abnormality. Electronically Signed   By: Margaretha Sheffield M.D.   On: 09/16/2022 16:04        Scheduled Meds:  aspirin EC  81 mg Oral Daily   atorvastatin  20 mg Oral QHS   DULoxetine  20 mg Oral Daily   enoxaparin (LOVENOX) injection  40 mg Subcutaneous Q24H   feeding supplement  237 mL Oral BID BM   ferrous sulfate  325 mg Oral BID WC   fluticasone furoate-vilanterol  1 puff Inhalation Daily   loratadine  10 mg Oral Daily   metoprolol tartrate  25 mg Oral BID   pantoprazole  40 mg Oral Daily   sodium chloride flush  3 mL Intravenous Q12H   torsemide  20 mg Oral Daily   Continuous Infusions:  sodium chloride     amiodarone 30 mg/hr (09/18/22 0245)   diltiazem (CARDIZEM) infusion Stopped (09/17/22 0815)    Assessment & Plan:   Principal Problem:   Acute on chronic combined systolic and diastolic CHF (congestive heart failure) (HCC) Active Problems:   COPD (chronic obstructive pulmonary disease) (HCC)   Multiple sclerosis (HCC)   Coronary artery disease involving  native coronary artery of native heart without angina pectoris   Normocytic anemia   Pulmonary hypertension (HCC)   Ischemic cardiomyopathy   Mitral valve insufficiency   Hypokalemia   Hypervolemia   Cardiorenal syndrome   SVT (supraventricular tachycardia)   Stage 3b chronic kidney disease (HCC)   HFrEF (heart failure with reduced ejection fraction) (HCC)   Acute on chronic combined systolic and diastolic CHF (congestive heart failure) ([place]) Coronary artery disease involving native coronary artery of native heart without angina pectoris Pulmonary hypertension ([place]) Ischemic cardiomyopathy Mitral valve insufficiency Hypervolemia -cardiology consulted EF 30 to 35% 10/12 more euvolemic. Transition to toprol xl '25mg'$  qd Bp too low for ace/arb/entresto Dc on torsemide '20mg'$  prn for weight gain    PSVT (supraventricular tachycardia) Had couple of more episodes.  Broke initially with adenosine on IV amiodarone 10/12 transition beta blocker to toprol xl  Tranition iv amiodarone to po '400mg'$  po bid. Oupt f/u with EP for possible ablation   Hypokalemia Replaced and stable.   Acute on Stage 3b chronic kidney disease ([place]) Cardiorenal syndrome Creatinine up. Will monitor    COPD (chronic obstructive pulmonary disease) ([place]) Does not appear to  be in acute exacerbation Continue home meds    Multiple sclerosis ([place]) Stable at this time.  Not on any medications.   Normocytic anemia IDA (iron deficiency anemia) Monitor periodic CBC follow outpatient    Tobacco abuse Have provided counseling.  Nicotine patch.    DVT prophylaxis: Lovenox Code Status: DNR Family Communication: None at bedside Disposition Plan:  Status is: Inpatient Remains inpatient appropriate because: IV treatment.        LOS: 2 days   Time spent: 35 min    Nolberto Hanlon, MD [facility] Pager [phone]  If 7PM-7AM, please contact night-coverage 09/18/2022, 8:33 AM

## 2022-09-18 NOTE — Plan of Care (Signed)
Progressing

## 2022-09-18 NOTE — TOC Initial Note (Signed)
Transition of Care [facility]) - Initial/Assessment Note    Patient Details  Name: Anita [name] MRN: [id number] Date of Birth: 06-28-1961  [facility] CM/SW Contact:    Anita Sam, LCSW Phone Number: 09/18/2022, 9:49 AM  Clinical Narrative:                  Per chart review patient lives with her daughter Anita [name]. Patient is followed by palliative care at home and is currently active with [facility]. PCP Anita Friendly, NP.  Although recommended last admission, patient declined to go to short term rehab.   TOC will continue to follow for appropriate discharge planning needs, PT/OT evals recommended prior to dc to ensure appropriate discharge disposition.   Expected Discharge Plan:  (TBD) Barriers to Discharge: Continued Medical Work up   Patient Goals and CMS Choice Patient states their goals for this hospitalization and ongoing recovery are:: to go home CMS Medicare.gov Compare Post Acute Care list provided to:: Patient Choice offered to / list presented to : Patient  Expected Discharge Plan and Services Expected Discharge Plan:  (TBD)       Living arrangements for the past 2 months: Single Family Home                                      Prior Living Arrangements/Services Living arrangements for the past 2 months: Single Family Home Lives with:: Adult Children                   Activities of Daily Living Home Assistive Devices/Equipment: Dentures (specify type), Walker (specify type), Eyeglasses ADL Screening (condition at time of admission) Patient's cognitive ability adequate to safely complete daily activities?: Yes Is the patient deaf or have difficulty hearing?: No Does the patient have difficulty seeing, even when wearing glasses/contacts?: No Does the patient have difficulty concentrating, remembering, or making decisions?: No Patient able to express need for assistance with ADLs?: Yes Does the patient have  difficulty dressing or bathing?: No Independently performs ADLs?: No Communication: Independent Dressing (OT): Needs assistance Is this a change from baseline?: Pre-admission baseline Grooming: Needs assistance Is this a change from baseline?: Pre-admission baseline Feeding: Independent Bathing: Needs assistance Is this a change from baseline?: Pre-admission baseline Toileting: Needs assistance Is this a change from baseline?: Pre-admission baseline In/Out Bed: Needs assistance Is this a change from baseline?: Pre-admission baseline Walks in Home: Needs assistance Is this a change from baseline?: Pre-admission baseline Does the patient have difficulty walking or climbing stairs?: Yes Weakness of Legs: Both Weakness of Arms/Hands: None  Permission Sought/Granted                  Emotional Assessment              Admission diagnosis:  Hypokalemia [E87.6] Hypomagnesemia [E83.42] Generalized weakness [R53.1] Paroxysmal SVT (supraventricular tachycardia) [I47.10] HFrEF (heart failure with reduced ejection fraction) ([place]) [I50.20] Patient Active Problem List   Diagnosis Date Noted   SVT (supraventricular tachycardia) 09/16/2022   Stage 3b chronic kidney disease ([place]) 09/16/2022   HFrEF (heart failure with reduced ejection fraction) ([place]) 09/16/2022   Hypervolemia    Cardiorenal syndrome    Hypokalemia 08/27/2022   Transaminitis 08/23/2022   Elevated liver enzymes    Mitral valve insufficiency 05/19/2021   UTI (urinary tract infection) 05/19/2021   Hypoxia 05/19/2021   Anasarca 05/19/2021   Esophageal dysphagia  01/08/2021   Ischemic cardiomyopathy 10/08/2020   Acute on chronic combined systolic and diastolic CHF (congestive heart failure) ([place]) 10/08/2020   Coronary artery disease involving native coronary artery of native heart without angina pectoris 10/08/2020   IDA (iron deficiency anemia) 10/05/2020   Vitamin B12 deficiency 08/23/2020   COPD (chronic  obstructive pulmonary disease) ([place]) 06/22/2020   Tobacco abuse 03/22/2020   Elevated troponin 03/22/2020   Acute on chronic HFrEF (heart failure with reduced ejection fraction) ([place]) 03/22/2020   Pulmonary hypertension ([place]) 03/22/2020   Insomnia 03/22/2020   SIRS (systemic inflammatory response syndrome) ([place]) 03/20/2020   Normocytic anemia 03/20/2020   Acute kidney injury superimposed on stage IIIa chronic kidney disease ([place]) 03/20/2020   Gastroesophageal reflux disease 02/15/2018   Chronic migraine without aura with status migrainosus, not intractable 02/15/2018   Osteoarthritis 02/15/2018   Essential hypertension 02/15/2018   Disturbance of skin sensation 03/26/2012   Multiple sclerosis ([place]) 03/26/2012   Acute back pain 03/26/2012   PCP:  Anita Koch, NP Pharmacy:   [name] [id number]: [phone]: [phone]    Social Determinants of Health (SDOH) Interventions    Readmission Risk Interventions    06/06/2021    9:43 AM  Readmission Risk Prevention Plan  Transportation Screening Complete  PCP or Specialist Appt within 3-5 Days Complete  HRI or Home Care Consult Complete  Social Work Consult for [place]sel[place] Not Complete  SW consult not completed comments RNCM assigned to case  Palliative Care Screening Not Applicable  Medication Review (Press photographer Complete

## 2022-09-18 NOTE — Progress Notes (Signed)
Rounding Note    Patient Name: Anita [name] Date of Encounter: 09/18/2022  [place] Cardiologist: Buford Dresser, MD   Subjective   Reports feeling relatively well, laying supine Significant shortness of breath, no PND orthopnea Abdomen feels soft, at its baseline Significant arrhythmia/SVT yesterday requiring pushes of adenosine, finally initiation of amiodarone bolus with infusion Since amiodarone initiated, no significant narrow complex tachycardia/SVT noted  Inpatient Medications    Scheduled Meds:  aspirin EC  81 mg Oral Daily   atorvastatin  20 mg Oral QHS   DULoxetine  20 mg Oral Daily   enoxaparin (LOVENOX) injection  30 mg Subcutaneous Q24H   feeding supplement  237 mL Oral BID BM   ferrous sulfate  325 mg Oral BID WC   fluticasone furoate-vilanterol  1 puff Inhalation Daily   loratadine  10 mg Oral Daily   metoprolol tartrate  25 mg Oral BID   pantoprazole  40 mg Oral Daily   sodium chloride flush  3 mL Intravenous Q12H   Continuous Infusions:  sodium chloride     amiodarone 30 mg/hr (09/18/22 1134)   PRN Meds: sodium chloride, acetaminophen, albuterol, doxylamine (Sleep), guaiFENesin, nicotine, nitroGLYCERIN, ondansetron (ZOFRAN) IV, sodium chloride flush, trolamine salicylate   Vital Signs    Vitals:   09/18/22 0419 09/18/22 0420 09/18/22 0750 09/18/22 1129  BP: 97/75  117/80 97/69  Pulse: 86  82 67  Resp: 18  18   Temp: 97.8 F (36.6 C)  98.4 F (36.9 C) 98 F (36.7 C)  TempSrc:   Oral   SpO2: 97%  96% 100%  Weight:  57.5 kg    Height:        Intake/Output Summary (Last 24 hours) at 09/18/2022 1418 Last data filed at 09/18/2022 1134 Gross per 24 hour  Intake 434.18 ml  Output --  Net 434.18 ml      09/18/2022    4:20 AM 09/16/2022    8:14 PM 09/16/2022   11:28 AM  Last 3 Weights  Weight (lbs) 126 lb 12.2 oz 120 lb 120 lb  Weight (kg) 57.5 kg 54.432 kg 54.432 kg      Telemetry    Normal sinus  rhythm- Personally Reviewed  ECG     - Personally Reviewed  Physical Exam   GEN: No acute distress.   Neck: No JVD Cardiac: RRR, no murmurs, rubs, or gallops.  Respiratory: Clear to auscultation bilaterally. GI: Soft, nontender, mildly distended  MS: No edema; No deformity. Neuro:  Nonfocal  Psych: Normal affect   Labs    High Sensitivity Troponin:   Recent Labs  Lab 08/21/22 1705 08/21/22 1840 08/23/22 0534  TROPONINIHS 601* 521* 122*     Chemistry Recent Labs  Lab 09/16/22 1329 09/17/22 0529 09/18/22 0402  NA 139 137 138  K 3.2* 3.0* 4.3  CL 104 107 107  CO2 '24 22 22  '$ GLUCOSE 95 80 102*  BUN 32* 30* 36*  CREATININE 1.65* 1.48* 1.91*  CALCIUM 8.9 7.9* 8.8*  MG 1.6* 1.9  --   PROT 7.9  --   --   ALBUMIN 3.6  --   --   AST 24  --   --   ALT 15  --   --   ALKPHOS 115  --   --   BILITOT 1.2  --   --   GFRNONAA 36* 41* 30*  ANIONGAP '11 8 9    '$ Lipids No results for input(s): "CHOL", "TRIG", "HDL", "LABVLDL", "  [place]", "CHOLHDL" in the last 168 hours.  Hematology Recent Labs  Lab 09/16/22 1329  WBC 6.3  RBC 3.86*  HGB 10.6*  HCT 34.5*  MCV 89.4  MCH 27.5  MCHC 30.7  RDW 18.2*  PLT 231   Thyroid No results for input(s): "TSH", "FREET4" in the last 168 hours.  BNP Recent Labs  Lab 09/16/22 1329  BNP 4,403.2*    DDimer No results for input(s): "DDIMER" in the last 168 hours.   Radiology    CT Angio Chest PE W/Cm &/Or Wo Cm  Result Date: 09/16/2022 CLINICAL DATA:  Pulmonary embolism (PE) suspected, high prob SVT today. EXAM: CT ANGIOGRAPHY CHEST WITH CONTRAST TECHNIQUE: Multidetector CT imaging of the chest was performed using the standard protocol during bolus administration of intravenous contrast. Multiplanar CT image reconstructions and MIPs were obtained to evaluate the vascular anatomy. RADIATION DOSE REDUCTION: This exam was performed according to the departmental dose-optimization program which includes automated exposure control,  adjustment of the mA and/or kV according to patient size and/or use of iterative reconstruction technique. CONTRAST:  74m OMNIPAQUE IOHEXOL 350 MG/ML SOLN COMPARISON:  Chest radiograph 08/21/2022, CT 03/20/2020 FINDINGS: Cardiovascular: There are no filling defects within the pulmonary arteries to suggest pulmonary embolus. Moderate cardiomegaly. There is marked atrial dilatation. Contrast refluxes into the hepatic veins. Coronary artery calcifications. Atherosclerosis of the thoracic aorta without aneurysm. Cannot assess for dissection given phase of contrast. Small pericardial effusion with fluid in the superior pericardial recess. Mediastinum/Nodes: Scattered small mediastinal lymph nodes, not enlarged by size criteria. There is no hilar adenopathy. No esophageal wall thickening. Lungs/Pleura: Small to moderate right and minimal left pleural effusion. Mild smooth septal thickening suggestive of pulmonary edema. Mild upper lobe emphysema. No pneumonia or focal airspace disease. No suspicious pulmonary nodule. Trachea and central bronchi are patent. Upper Abdomen: Upper abdominal ascites. Chronic left retroperitoneal like densities spans 6 cm, unchanged in present over multiple prior exams. There are surgical clips about the inferior aspect of this lesion. Musculoskeletal: Thoracic spondylosis with spurring. There are no acute or suspicious osseous abnormalities. Mild body wall edema. Review of the MIP images confirms the above findings. IMPRESSION: 1. No pulmonary embolus. 2. Moderate cardiomegaly with marked atrial dilatation. Contrast refluxes into the hepatic veins, consistent with elevated right heart pressures. 3. Small to moderate right and minimal left pleural effusions. Mild pulmonary edema. 4. Upper abdominal ascites.  Body wall edema. Aortic Atherosclerosis (ICD10-I70.0) and Emphysema (ICD10-J43.9). Electronically Signed   By: MKeith RakeM.D.   On: 09/16/2022 16:09   CT HEAD WO CONTRAST  (5MM)  Result Date: 09/16/2022 CLINICAL DATA:  Headache, new or worsening (Age >= 50y) EXAM: CT HEAD WITHOUT CONTRAST TECHNIQUE: Contiguous axial images were obtained from the base of the skull through the vertex without intravenous contrast. RADIATION DOSE REDUCTION: This exam was performed according to the departmental dose-optimization program which includes automated exposure control, adjustment of the mA and/or kV according to patient size and/or use of iterative reconstruction technique. COMPARISON:  MRI head September 16, 2022. FINDINGS: Brain: No evidence of acute infarction, hemorrhage, hydrocephalus, extra-axial collection or mass lesion/mass effect. Patchy white matter hypoattenuation, nonspecific but compatible with chronic microvascular disease. Remote perforator infarct in the right basal ganglia. Cerebral atrophy. Vascular: No hyperdense vessel identified. Skull: No acute fracture. Sinuses/Orbits: Visualized sinuses are largely clear. No acute orbital findings. Other: No mastoid effusions. IMPRESSION: No evidence of acute intracranial abnormality. Electronically Signed   By: FMargaretha SheffieldM.D.   On: 09/16/2022 16:04  Cardiac Studies   Echocardiogram August 22, 2022   1. Left ventricular ejection fraction, by estimation, is 30 to 35%. The  left ventricle has moderately decreased function. The left ventricle  demonstrates global hypokinesis. There is mild left ventricular  hypertrophy. Left ventricular diastolic  parameters are indeterminate.   2. Right ventricular systolic function is severely reduced. The right  ventricular size is moderately enlarged. Tricuspid regurgitation signal is  inadequate for assessing PA pressure.   3. Right atrial size was moderately dilated.   4. The mitral valve is normal in structure. Severe mitral valve  regurgitation. No evidence of mitral stenosis.   5. Tricuspid valve regurgitation is severe.   6. The aortic valve is normal in structure.  Aortic valve regurgitation is  mild. Aortic valve sclerosis/calcification is present, without any  evidence of aortic stenosis.   7. The inferior vena cava is normal in size with greater than 50%  respiratory variability, suggesting right atrial pressure of 3 mmHg.   Patient Profile     61 year old female with history of CAD(CTO-RCA, moderate LAD, moderate left circumflex disease), paroxysmal SVT, severe MR, multiple sclerosis, ischemic cardiomyopathy EF 30 to 35%, former smoker x20 years who presents due to weakness and SVT.  Assessment & Plan    Paroxysmal SVT Previously noted on cardiac monitor October 2022 Was at primary care physician's office noted to be in SVT, EMS gave adenosine 6/12, normal sinus rhythm restored 7 AM September 17, 2022 redeveloped SVT rates 150 up to 160, started on Cardizem infusion given carvedilol Given adenosine IV push, 6/12, back to normal sinus rhythm Recurrent SVT noted yesterday afternoon 4 PM requiring adenosine 6/12 at which time amiodarone was initiated -Continue metoprolol with transition to metoprolol succinate in the setting of cardiomyopathy Transition to amiodarone 400 twice daily, hold infusion -Outpt follow-up with EP for consideration of ablation  Cardiomyopathy History of coronary artery disease, CTO of the RCA, moderate LAD and left circumflex disease Ejection fraction 30 to 35% Transition to metoprolol succinate 25 daily Blood pressure low limiting addition of ACE/ARB/Entresto At the time of discharge continue torsemide 20 mg as needed for weight gain  Severe mitral valve regurgitation Will defer management back to primary cardiologist, notes indicating patient has declined surgery and may not be a candidate  Long discussion with her concerning management of SVT  Total encounter time more than 50 minutes  Greater than 50% was spent in counseling and coordination of care with the patient   For questions or updates, please contact [place] Please consult [email or web address] for contact info under        Signed, Ida Rogue, MD  09/18/2022, 2:18 PM

## 2022-09-19 ENCOUNTER — Ambulatory Visit (HOSPITAL_BASED_OUTPATIENT_CLINIC_OR_DEPARTMENT_OTHER): Payer: Medicare Other | Admitting: Family

## 2022-09-19 DIAGNOSIS — K7460 Unspecified cirrhosis of liver: Secondary | ICD-10-CM

## 2022-09-19 DIAGNOSIS — I420 Dilated cardiomyopathy: Secondary | ICD-10-CM

## 2022-09-19 DIAGNOSIS — I5043 Acute on chronic combined systolic (congestive) and diastolic (congestive) heart failure: Secondary | ICD-10-CM | POA: Diagnosis not present

## 2022-09-19 DIAGNOSIS — I471 Supraventricular tachycardia, unspecified: Secondary | ICD-10-CM | POA: Diagnosis not present

## 2022-09-19 DIAGNOSIS — R531 Weakness: Secondary | ICD-10-CM

## 2022-09-19 DIAGNOSIS — R188 Other ascites: Secondary | ICD-10-CM

## 2022-09-19 LAB — CBC
HCT: 36.2 % (ref 36.0–46.0)
Hemoglobin: 11.2 g/dL — ABNORMAL LOW (ref 12.0–15.0)
MCH: 26.9 pg (ref 26.0–34.0)
MCHC: 30.9 g/dL (ref 30.0–36.0)
MCV: 87 fL (ref 80.0–100.0)
Platelets: 236 10*3/uL (ref 150–400)
RBC: 4.16 MIL/uL (ref 3.87–5.11)
RDW: 18.4 % — ABNORMAL HIGH (ref 11.5–15.5)
WBC: 6.3 10*3/uL (ref 4.0–10.5)
nRBC: 0 % (ref 0.0–0.2)

## 2022-09-19 LAB — RENAL FUNCTION PANEL
Albumin: 3.5 g/dL (ref 3.5–5.0)
Anion gap: 10 (ref 5–15)
BUN: 42 mg/dL — ABNORMAL HIGH (ref 6–20)
CO2: 20 mmol/L — ABNORMAL LOW (ref 22–32)
Calcium: 9 mg/dL (ref 8.9–10.3)
Chloride: 104 mmol/L (ref 98–111)
Creatinine, Ser: 2.15 mg/dL — ABNORMAL HIGH (ref 0.44–1.00)
GFR, Estimated: 26 mL/min — ABNORMAL LOW (ref >60)
Glucose, Bld: 85 mg/dL (ref 70–99)
Phosphorus: 4.8 mg/dL — ABNORMAL HIGH (ref 2.5–4.6)
Potassium: 3.9 mmol/L (ref 3.5–5.1)
Sodium: 134 mmol/L — ABNORMAL LOW (ref 135–145)

## 2022-09-19 MED ORDER — ASPIRIN 81 MG PO TBEC: 81.0000 mg | DELAYED_RELEASE_TABLET | Freq: Every day | ORAL | 0 refills | Status: AC

## 2022-09-19 MED ORDER — AMIODARONE HCL 200 MG PO TABS: 200.0000 mg | ORAL_TABLET | Freq: Two times a day (BID) | ORAL | 0 refills | Status: DC

## 2022-09-19 MED ORDER — ATORVASTATIN CALCIUM 20 MG PO TABS: 20.0000 mg | ORAL_TABLET | Freq: Every day | ORAL | 0 refills | Status: DC

## 2022-09-19 NOTE — Care Management Important Message (Signed)
Important Message  Patient Details  Name: Anita [name] MRN: [id number] Date of Birth: May 17, 1961   Medicare Important Message Given:  Yes     Dannette Barbara 09/19/2022, 11:44 AM

## 2022-09-19 NOTE — TOC Transition Note (Signed)
Transition of Care [facility]) - CM/SW Discharge Note   Patient Details  Name: Anita [name] MRN: [id number] Date of Birth: 12-29-1960  [facility]) CM/SW Contact:  Alberteen Sam, LCSW Phone Number: 09/19/2022, 1:59 PM   Clinical Narrative:     Patient to discharge today with [facility] for PT and OT, Olivia Lopez de Gutierrez orders are in. No further dc needs.  Malachy Mood with [facility] informed of discharge.    Final next level of care: [place] Barriers to Discharge: No Barriers Identified   Patient Goals and CMS Choice Patient states their goals for this hospitalization and ongoing recovery are:: to go home CMS Medicare.gov Compare Post Acute Care list provided to:: Patient Choice offered to / list presented to : Patient  Discharge Placement                       Discharge Plan and Services                          HH Arranged: PT, OT [facility] Agency: [place] Date [place]: 09/19/22   Representative spoke with at [place]: [place] ([place]) Interventions     Readmission Risk Interventions    06/06/2021    9:43 AM  Readmission Risk Prevention Plan  Transportation Screening Complete  PCP or Specialist Appt within 3-5 Days Complete  HRI or Home Care Consult Complete  Social Work Consult for [place] Planning/Counseling Not Complete  SW consult not completed comments RNCM assigned to case  Palliative Care Screening Not Applicable  Medication Review [employer]) Complete

## 2022-09-19 NOTE — Progress Notes (Addendum)
Anita [name] XAJ:[id number] DOB: [birth year] DOA: 09/16/2022  PCP: [name], NP  Admit date: 09/16/2022 Discharge date: 09/19/2022  Admitted From: home Disposition:  home  Recommendations for Outpatient Follow-up:  Follow up with PCP in 1 week Please obtain BMP/CBC in one week 3. F/u with cardiology in one week, and EP (to be setup with primary cardiologist)   Home Health:yes    Discharge Condition:Stable CODE STATUS:DNR  Diet recommendation: Heart Healthy  Brief/Interim Summary: Per HPI: 61 year old female with past medical history of multiple sclerosis and systolic/diastolic heart failure with moderate to severe tricuspid regurg and severe mitral regurg. Presents to ED via EMS with SVT, sent by PCP office where had ECG with SVT, rate of 173.  EMS gave her 6 mg and then 12 mg of adenosine and arrived at the ER in sinus tachycardia.. During her stay she 2 more episodes of NSVT and adenosine was again given.  The second time adenosine did not work and she was started on amiodarone drip.  She is transition to p.o. amiodarone.  Cardiology was following.  She was also diuresed.  Today she is stable for discharge.  Due to her creatinine mildly elevated she was instructed to hold off on her diuretics for few days and resume as instructed as as needed dosing.  She was also instructed to hydrate the next day or 2.  She is stable and been cleared for discharge by cardiology.  They will try to set her up for EP consult as outpatient.   Acute on chronic combined systolic and diastolic CHF (congestive heart failure) (HCC) Coronary artery disease involving native coronary artery of native heart without angina pectoris Pulmonary hypertension ([place]) Ischemic cardiomyopathy Mitral valve insufficiency Hypervolemia Cardiology was consulted.  EF 30 to 35% Diuresed, euvolemic  History of coronary artery disease, CTO of the RCA, moderate LAD and left circumflex disease Ejection  fraction 30 to 35% Continue metoprolol succinate 25 daily Blood pressure low limiting addition of ACE/ARB/Entresto Hold torsemide 3-4 days then resume torsemide 20 mg daily as needed for weight gain/ABD swelling   PSVT (supraventricular tachycardia) Was at primary care physician's office noted to be in SVT, EMS gave adenosine 6/12, normal sinus rhythm restored 7 AM September 17, 2022 redeveloped SVT rates 150 up to 160, started on Cardizem infusion given carvedilol Given adenosine IV push, 6/12, back to normal sinus rhythm Recurrent SVT noted yesterday afternoon 4 PM requiring adenosine 6/12 at which time amiodarone was initiated -Continue metoprolol succinate in the setting of cardiomyopathy amiodarone 400 twice daily for 4 more days then 200 BID -Outpt follow-up with EP for consideration of ablation   Hypokalemia Replaced and stable.     Acute on Stage 3b chronic kidney disease ([place]) Cardiorenal syndrome Creatinine up. Recommended as above with Torsemide at home, and hydrate next couple of days F/u with pcp for labs   COPD (chronic obstructive pulmonary disease) ([place]) Does not appear to be in acute exacerbation Continue home meds    Multiple sclerosis ([place])  Not on any medications.   Normocytic anemia IDA (iron deficiency anemia) Follow pcp as outpatient for further managment   Tobacco abuse Have provided counseling.         Discharge Diagnoses:  Principal Problem:   Acute on chronic combined systolic and diastolic CHF (congestive heart failure) (HCC) Active Problems:   COPD (chronic obstructive pulmonary disease) (HCC)   Multiple sclerosis (HCC)   Coronary artery disease involving native coronary artery of native heart without angina pectoris  Normocytic anemia   Pulmonary hypertension (HCC)   Ischemic cardiomyopathy   Mitral valve insufficiency   Hypokalemia   Hypervolemia   Cardiorenal syndrome   Paroxysmal SVT (supraventricular tachycardia)   Stage 3b  chronic kidney disease (HCC)   HFrEF (heart failure with reduced ejection fraction) (HCC)   Generalized weakness   Dilated cardiomyopathy (HCC)   Cirrhosis of liver with ascites [facility])    Discharge Instructions  Discharge Instructions     Call MD for:  difficulty breathing, headache or visual disturbances   Complete by: As directed    Call MD for:  persistant dizziness or light-headedness   Complete by: As directed    Diet - low sodium heart healthy   Complete by: As directed    Discharge instructions   Complete by: As directed    Take amidodarone '400mg'$  first dose tonight at 22:00 Continue metoprolol succinate 25 daily Hold torsemide 3-4 days then resume torsemide 20 mg daily as needed for weight gain/ABD swelling as directed   Increase activity slowly   Complete by: As directed       Allergies as of 09/19/2022       Reactions   Acthar Hp [corticotropin] Other (See Comments)   Throat swelling and coughing up blood   Codeine    Prednisone         Medication List     STOP taking these medications    SLEEP AID PO       TAKE these medications    Advair Diskus 250-50 MCG/ACT Aepb Generic drug: fluticasone-salmeterol INHALE 1 PUFF INTO THE LUNGS TWICE A DAY   albuterol 108 (90 Base) MCG/ACT inhaler Commonly known as: VENTOLIN HFA INHALE 1-2 PUFFS INTO THE LUNGS EVERY 6 (SIX) HOURS AS NEEDED FOR WHEEZING OR SHORTNESS OF BREATH. USE SPARINGLY!   amiodarone 200 MG tablet Commonly known as: PACERONE Take 1 tablet (200 mg total) by mouth 2 (two) times daily for 42 doses.   aspirin EC 81 MG tablet Take 1 tablet (81 mg total) by mouth daily. Swallow whole. Start taking on: September 20, 2022   atorvastatin 20 MG tablet Commonly known as: LIPITOR Take 1 tablet (20 mg total) by mouth at bedtime.   cetirizine 10 MG tablet Commonly known as: ZYRTEC Take 10 mg by mouth daily.   DULoxetine 20 MG capsule Commonly known as: Cymbalta Take 1 capsule (20 mg total) by  mouth daily. For anxiety and pain.   feeding supplement Liqd Take 237 mLs by mouth 2 (two) times daily between meals.   ferrous sulfate 325 (65 FE) MG EC tablet Take 1 tablet (325 mg total) by mouth 2 (two) times daily with a meal.   Flintstones Multivitamin Chew Chew by mouth.   guaiFENesin 600 MG 12 hr tablet Commonly known as: MUCINEX Take 600 mg by mouth 2 (two) times daily as needed for cough.   metoprolol succinate 25 MG 24 hr tablet Commonly known as: TOPROL-XL Take 1 tablet (25 mg total) by mouth daily. Take with or immediately following a meal.   nitroGLYCERIN 0.4 MG SL tablet Commonly known as: NITROSTAT Place 1 tablet (0.4 mg total) under the tongue every 5 (five) minutes as needed for chest pain.   omeprazole 40 MG capsule Commonly known as: PRILOSEC Take 1 capsule (40 mg total) by mouth daily.   Probiotic Advanced Caps Take 1 capsule by mouth daily.   torsemide 20 MG tablet Commonly known as: Demadex Take for weight gain of 2 lbs in 1  day or 5 lbs in 1 week or obvious swelling.   trolamine salicylate 10 % cream Commonly known as: ASPERCREME Apply 1 application. topically daily as needed for muscle pain (back or joint pain).   vitamin B-12 500 MCG tablet Commonly known as: CYANOCOBALAMIN Take 500 mcg by mouth 4 (four) times a week. 4 times weekly no more than 2000 MCG per week         Allergies  Allergen Reactions   Acthar Hp [Corticotropin] Other (See Comments)    Throat swelling and coughing up blood   Codeine    Prednisone     Consultations: cardiology   Procedures/Studies: CT Angio Chest PE W/Cm &/Or Wo Cm  Result Date: 09/16/2022 CLINICAL DATA:  Pulmonary embolism (PE) suspected, high prob SVT today. EXAM: CT ANGIOGRAPHY CHEST WITH CONTRAST TECHNIQUE: Multidetector CT imaging of the chest was performed using the standard protocol during bolus administration of intravenous contrast. Multiplanar CT image reconstructions and MIPs were  obtained to evaluate the vascular anatomy. RADIATION DOSE REDUCTION: This exam was performed according to the departmental dose-optimization program which includes automated exposure control, adjustment of the mA and/or kV according to patient size and/or use of iterative reconstruction technique. CONTRAST:  28m OMNIPAQUE IOHEXOL 350 MG/ML SOLN COMPARISON:  Chest radiograph 08/21/2022, CT 03/20/2020 FINDINGS: Cardiovascular: There are no filling defects within the pulmonary arteries to suggest pulmonary embolus. Moderate cardiomegaly. There is marked atrial dilatation. Contrast refluxes into the hepatic veins. Coronary artery calcifications. Atherosclerosis of the thoracic aorta without aneurysm. Cannot assess for dissection given phase of contrast. Small pericardial effusion with fluid in the superior pericardial recess. Mediastinum/Nodes: Scattered small mediastinal lymph nodes, not enlarged by size criteria. There is no hilar adenopathy. No esophageal wall thickening. Lungs/Pleura: Small to moderate right and minimal left pleural effusion. Mild smooth septal thickening suggestive of pulmonary edema. Mild upper lobe emphysema. No pneumonia or focal airspace disease. No suspicious pulmonary nodule. Trachea and central bronchi are patent. Upper Abdomen: Upper abdominal ascites. Chronic left retroperitoneal like densities spans 6 cm, unchanged in present over multiple prior exams. There are surgical clips about the inferior aspect of this lesion. Musculoskeletal: Thoracic spondylosis with spurring. There are no acute or suspicious osseous abnormalities. Mild body wall edema. Review of the MIP images confirms the above findings. IMPRESSION: 1. No pulmonary embolus. 2. Moderate cardiomegaly with marked atrial dilatation. Contrast refluxes into the hepatic veins, consistent with elevated right heart pressures. 3. Small to moderate right and minimal left pleural effusions. Mild pulmonary edema. 4. Upper abdominal  ascites.  Body wall edema. Aortic Atherosclerosis (ICD10-I70.0) and Emphysema (ICD10-J43.9). Electronically Signed   By: MKeith RakeM.D.   On: 09/16/2022 16:09   CT HEAD WO CONTRAST (5MM)  Result Date: 09/16/2022 CLINICAL DATA:  Headache, new or worsening (Age >= 50y) EXAM: CT HEAD WITHOUT CONTRAST TECHNIQUE: Contiguous axial images were obtained from the base of the skull through the vertex without intravenous contrast. RADIATION DOSE REDUCTION: This exam was performed according to the departmental dose-optimization program which includes automated exposure control, adjustment of the mA and/or kV according to patient size and/or use of iterative reconstruction technique. COMPARISON:  MRI head September 16, 2022. FINDINGS: Brain: No evidence of acute infarction, hemorrhage, hydrocephalus, extra-axial collection or mass lesion/mass effect. Patchy white matter hypoattenuation, nonspecific but compatible with chronic microvascular disease. Remote perforator infarct in the right basal ganglia. Cerebral atrophy. Vascular: No hyperdense vessel identified. Skull: No acute fracture. Sinuses/Orbits: Visualized sinuses are largely clear. No acute orbital findings. Other:  No mastoid effusions. IMPRESSION: No evidence of acute intracranial abnormality. Electronically Signed   By: Margaretha Sheffield M.D.   On: 09/16/2022 16:04   ECHOCARDIOGRAM COMPLETE  Result Date: 08/22/2022    ECHOCARDIOGRAM REPORT   Patient Name:   AIMY SWEETING Date of Exam: 08/22/2022 Medical Rec #:  [id number]             Height:       63.0 in Accession #:    [id number]            Weight:       139.3 lb Date of Birth:  1961-01-09             BSA:          1.658 m Patient Age:    59 years              BP:           134/89 mmHg Patient Gender: F                     HR:           85 bpm. Exam Location:  [facility] Procedure: 2D Echo, Color Doppler and Cardiac Doppler Indications:     Dyspnea R06.00  History:         Patient has prior history of  Echocardiogram examinations, most                  recent 02/07/2021. CHF; COPD. Severe Mitral regurgitation.  Sonographer:     Sherrie Sport Referring Phys:  Tarri Glenn Diagnosing Phys: Ida Rogue MD  Sonographer Comments: Technically challenging study due to limited acoustic windows, no subcostal window and suboptimal apical window. IMPRESSIONS  1. Left ventricular ejection fraction, by estimation, is 30 to 35%. The left ventricle has moderately decreased function. The left ventricle demonstrates global hypokinesis. There is mild left ventricular hypertrophy. Left ventricular diastolic parameters are indeterminate.  2. Right ventricular systolic function is severely reduced. The right ventricular size is moderately enlarged. Tricuspid regurgitation signal is inadequate for assessing PA pressure.  3. Right atrial size was moderately dilated.  4. The mitral valve is normal in structure. Severe mitral valve regurgitation. No evidence of mitral stenosis.  5. Tricuspid valve regurgitation is severe.  6. The aortic valve is normal in structure. Aortic valve regurgitation is mild. Aortic valve sclerosis/calcification is present, without any evidence of aortic stenosis.  7. The inferior vena cava is normal in size with greater than 50% respiratory variability, suggesting right atrial pressure of 3 mmHg. FINDINGS  Left Ventricle: Left ventricular ejection fraction, by estimation, is 30 to 35%. The left ventricle has moderately decreased function. The left ventricle demonstrates global hypokinesis. The left ventricular internal cavity size was normal in size. There is mild left ventricular hypertrophy. Left ventricular diastolic parameters are indeterminate. Right Ventricle: The right ventricular size is moderately enlarged. No increase in right ventricular wall thickness. Right ventricular systolic function is severely reduced. Tricuspid regurgitation signal is inadequate for assessing PA pressure. Left Atrium: Left  atrial size was normal in size. Right Atrium: Right atrial size was moderately dilated. Pericardium: There is no evidence of pericardial effusion. Mitral Valve: The mitral valve is normal in structure. Severe mitral valve regurgitation. No evidence of mitral valve stenosis. Tricuspid Valve: The tricuspid valve is normal in structure. Tricuspid valve regurgitation is severe. No evidence of tricuspid stenosis. Aortic Valve: The aortic valve is normal in structure. Aortic valve regurgitation is  mild. Aortic valve sclerosis/calcification is present, without any evidence of aortic stenosis. Aortic valve mean gradient measures 2.5 mmHg. Aortic valve peak gradient measures 4.5 mmHg. Aortic valve area, by VTI measures 1.60 cm. Pulmonic Valve: The pulmonic valve was normal in structure. Pulmonic valve regurgitation is not visualized. No evidence of pulmonic stenosis. Aorta: The aortic root is normal in size and structure. Venous: The inferior vena cava is normal in size with greater than 50% respiratory variability, suggesting right atrial pressure of 3 mmHg. IAS/Shunts: No atrial level shunt detected by color flow Doppler. Additional Comments: There is a small pleural effusion in the left lateral region.  LEFT VENTRICLE PLAX 2D LVIDd:         4.30 cm      Diastology LVIDs:         3.90 cm      LV e' medial:    6.42 cm/s LV PW:         1.20 cm      LV E/e' medial:  20.6 LV IVS:        1.30 cm      LV e' lateral:   10.10 cm/s LVOT diam:     2.00 cm      LV E/e' lateral: 13.1 LV SV:         28 LV SV Index:   17 LVOT Area:     3.14 cm  LV Volumes (MOD) LV vol d, MOD A2C: 144.0 ml LV vol d, MOD A4C: 173.0 ml LV vol s, MOD A2C: 104.0 ml LV vol s, MOD A4C: 113.0 ml LV SV MOD A2C:     40.0 ml LV SV MOD A4C:     173.0 ml LV SV MOD BP:      58.0 ml RIGHT VENTRICLE RV Basal diam:  3.80 cm RV Mid diam:    3.60 cm RV S prime:     5.87 cm/s TAPSE (M-mode): 2.1 cm LEFT ATRIUM             Index        RIGHT ATRIUM           Index LA  diam:        3.90 cm 2.35 cm/m   RA Area:     31.50 cm LA Vol (A2C):   95.5 ml 57.59 ml/m  RA Volume:   116.00 ml 69.95 ml/m LA Vol (A4C):   56.3 ml 33.95 ml/m LA Biplane Vol: 73.2 ml 44.14 ml/m  AORTIC VALVE AV Area (Vmax):    1.55 cm AV Area (Vmean):   1.37 cm AV Area (VTI):     1.60 cm AV Vmax:           105.50 cm/s AV Vmean:          72.200 cm/s AV VTI:            0.174 m AV Peak Grad:      4.5 mmHg AV Mean Grad:      2.5 mmHg LVOT Vmax:         51.90 cm/s LVOT Vmean:        31.400 cm/s LVOT VTI:          0.089 m LVOT/AV VTI ratio: 0.51  AORTA Ao Root diam: 3.07 cm MITRAL VALVE                TRICUSPID VALVE MV Area (PHT): 3.34 cm     TR Peak grad:   10.5 mmHg MV Decel  Time: 227 msec     TR Vmax:        162.00 cm/s MV E velocity: 132.00 cm/s MV A velocity: 83.80 cm/s   SHUNTS MV E/A ratio:  1.58         Systemic VTI:  0.09 m                             Systemic Diam: 2.00 cm Ida Rogue MD Electronically signed by Ida Rogue MD Signature Date/Time: 08/22/2022/3:35:47 PM    Final    US RENAL  Result Date: 08/22/2022 CLINICAL DATA:  Left retroperitoneal mass seen on CT. EXAM: RENAL / URINARY TRACT ULTRASOUND COMPLETE COMPARISON:  CT abdomen pelvis from same day. CT chest dated March 20, 2020. PET-CT dated March 19, 2006. FINDINGS: Right Kidney: Renal measurements: 9.8 x 5.1 x 5.3 cm = volume: 139 mL. Increased echogenicity. No mass or hydronephrosis visualized. Left Kidney: Renal measurements: 6.5 x 3.7 x 3.3 cm = volume: 41 mL. Increased echogenicity. No mass or hydronephrosis visualized. Bladder: Appears normal for degree of bladder distention. Other: Incidentally noted left retroperitoneal mass superior and medial to the left kidney, measuring 4.0 x 3.3 x 3.7 cm. Large ascites. IMPRESSION: 1. Left retroperitoneal mass, stable in size since 2021, and decreased in size since 2007. This was biopsied in 2007, pathology consistent with ganglioneuroma. 2. No renal mass. Left renal atrophy and  bilateral renal increased echogenicity, consistent with medical renal disease. 3. Unchanged large ascites. Electronically Signed   By: Titus Dubin M.D.   On: 08/22/2022 12:30   CT ABDOMEN PELVIS WO CONTRAST  Result Date: 08/22/2022 CLINICAL DATA:  Abdominal distension EXAM: CT ABDOMEN AND PELVIS WITHOUT CONTRAST TECHNIQUE: Multidetector CT imaging of the abdomen and pelvis was performed following the standard protocol without IV contrast. RADIATION DOSE REDUCTION: This exam was performed according to the departmental dose-optimization program which includes automated exposure control, adjustment of the mA and/or kV according to patient size and/or use of iterative reconstruction technique. COMPARISON:  06/04/2021 FINDINGS: Lower chest: Cardiomegaly. Small bilateral pleural effusions. Coronary artery and aortic calcifications. Hepatobiliary: No focal hepatic abnormality. Gallbladder unremarkable. Pancreas: No focal abnormality or ductal dilatation. Spleen: No focal abnormality.  Normal size. Adrenals/Urinary Tract: Left retroperitoneal mass again noted in the region surgical clips in the left retroperitoneum adjacent to the left kidney measuring 5.5 x 3.2 cm compared to up to 5.6 cm previously. Left kidney is atrophic. No renal stones or hydronephrosis. Renal vascular calcifications noted. Urinary bladder decompressed, grossly unremarkable. Stomach/Bowel: Scattered sigmoid diverticula. No active diverticulitis. Stomach and small bowel decompressed, grossly unremarkable. No bowel obstruction. Vascular/Lymphatic: Heavily calcified aorta, iliac vessels and branch vessels. No evidence of aneurysm or adenopathy. Reproductive: Uterus and adnexa unremarkable.  No mass. Other: Large volume ascites in the abdomen and pelvis. Musculoskeletal: No acute bony abnormality. IMPRESSION: Large volume ascites in the abdomen or pelvis. Small bilateral pleural effusions. Cardiomegaly, coronary artery disease. Left  retroperitoneal mass adjacent to surgical clips superior to the left kidney. This is not significantly changed. Scattered sigmoid diverticulosis. Diffuse aortoiliac and branch vessel atherosclerosis. Electronically Signed   By: Rolm Baptise M.D.   On: 08/22/2022 01:03   DG Chest 2 View  Result Date: 08/21/2022 CLINICAL DATA:  Chest pain. EXAM: CHEST - 2 VIEW COMPARISON:  Abdominal series 07/04/2021 FINDINGS: The heart is moderately enlarged, unchanged. There is no focal lung consolidation, pleural effusion or pneumothorax. No acute fractures are seen. There are  atherosclerotic calcifications of the aorta. IMPRESSION: 1. Stable moderate cardiomegaly. 2. No acute cardiopulmonary process. Electronically Signed   By: Ronney Asters M.D.   On: 08/21/2022 17:42      Subjective:   Discharge Exam: Vitals:   09/19/22 0804 09/19/22 1218  BP: 126/84 120/86  Pulse: 72 68  Resp: 20 (!) 22  Temp: 98.3 F (36.8 C) (!) 97.4 F (36.3 C)  SpO2: 97% 99%   Vitals:   09/18/22 1947 09/19/22 0402 09/19/22 0804 09/19/22 1218  BP: 124/84 120/85 126/84 120/86  Pulse: 75 74 72 68  Resp: '18 16 20 '$ (!) 22  Temp: 97.6 F (36.4 C) 97.7 F (36.5 C) 98.3 F (36.8 C) (!) 97.4 F (36.3 C)  TempSrc: Oral Oral    SpO2: 97% 97% 97% 99%  Weight:      Height:        General: Pt is alert, awake, not in acute distress Cardiovascular: RRR, S1/S2 +, no rubs, no gallops Respiratory: CTA bilaterally, no wheezing, no rhonchi Abdominal: Soft, NT, ND, bowel sounds + Extremities: no edema, no cyanosis    The results of significant diagnostics from this hospitalization (including imaging, microbiology, ancillary and laboratory) are listed below for reference.     Microbiology: Recent Results (from the past 240 hour(s))  SARS Coronavirus 2 by RT PCR (hospital order, performed in [facility] hospital lab) *cepheid single result test* Anterior Nasal Swab     Status: None   Collection Time: 09/17/22 10:54 AM   Specimen:  Anterior Nasal Swab  Result Value Ref Range Status   SARS Coronavirus 2 by RT PCR NEGATIVE NEGATIVE Final    Comment: (NOTE) SARS-CoV-2 target nucleic acids are NOT DETECTED.  The SARS-CoV-2 RNA is generally detectable in upper and lower respiratory specimens during the acute phase of infection. The lowest concentration of SARS-CoV-2 viral copies this assay can detect is 250 copies / mL. A negative result does not preclude SARS-CoV-2 infection and should not be used as the sole basis for treatment or other patient management decisions.  A negative result may occur with improper specimen collection / handling, submission of specimen other than nasopharyngeal swab, presence of viral mutation(s) within the areas targeted by this assay, and inadequate number of viral copies (<250 copies / mL). A negative result must be combined with clinical observations, patient history, and epidemiological information.  Fact Sheet for Patients:   https://www.patel.info/  Fact Sheet for Healthcare Providers: https://hall.com/  This test is not yet approved or  cleared by the [place] FDA and has been authorized for detection and/or diagnosis of SARS-CoV-2 by FDA under an Emergency Use Authorization (EUA).  This EUA will remain in effect (meaning this test can be used) for the duration of the COVID-19 declaration under Section 564(b)(1) of the Act, 21 U.S.C. section 360bbb-3(b)(1), unless the authorization is terminated or revoked sooner.  Performed at [facility], [address] [place], [place] [address]      Labs: BNP (last 3 results) Recent Labs    08/21/22 1705 09/16/22 1329  BNP >4,500.0* 7,654.6*   Basic Metabolic Panel: Recent Labs  Lab 09/16/22 1329 09/17/22 0529 09/18/22 0402 09/19/22 0417  NA 139 137 138 134*  K 3.2* 3.0* 4.3 3.9  CL 104 107 107 104  CO2 '24 22 22 '$ 20*  GLUCOSE 95 80 102* 85  BUN 32* 30* 36* 42*   CREATININE 1.65* 1.48* 1.91* 2.15*  CALCIUM 8.9 7.9* 8.8* 9.0  MG 1.6* 1.9  --   --  PHOS  --   --   --  4.8*   Liver Function Tests: Recent Labs  Lab 09/16/22 1329 09/19/22 0417  AST 24  --   ALT 15  --   ALKPHOS 115  --   BILITOT 1.2  --   PROT 7.9  --   ALBUMIN 3.6 3.5   No results for input(s): "LIPASE", "AMYLASE" in the last 168 hours. No results for input(s): "AMMONIA" in the last 168 hours. CBC: Recent Labs  Lab 09/16/22 1329 09/19/22 0417  WBC 6.3 6.3  NEUTROABS 4.9  --   HGB 10.6* 11.2*  HCT 34.5* 36.2  MCV 89.4 87.0  PLT 231 236   Cardiac Enzymes: No results for input(s): "CKTOTAL", "CKMB", "CKMBINDEX", "TROPONINI" in the last 168 hours. BNP: Invalid input(s): "POCBNP" CBG: No results for input(s): "GLUCAP" in the last 168 hours. D-Dimer No results for input(s): "DDIMER" in the last 72 hours. Hgb A1c No results for input(s): "HGBA1C" in the last 72 hours. Lipid Profile No results for input(s): "CHOL", "HDL", "LDLCALC", "TRIG", "CHOLHDL", "LDLDIRECT" in the last 72 hours. Thyroid function studies No results for input(s): "TSH", "T4TOTAL", "T3FREE", "THYROIDAB" in the last 72 hours.  Invalid input(s): "FREET3" Anemia work up No results for input(s): "VITAMINB12", "FOLATE", "FERRITIN", "TIBC", "IRON", "RETICCTPCT" in the last 72 hours. Urinalysis    Component Value Date/Time   COLORURINE STRAW (A) 06/04/2021 1456   APPEARANCEUR CLEAR (A) 06/04/2021 1456   APPEARANCEUR Clear 07/02/2020 1620   LABSPEC 1.006 06/04/2021 1456   PHURINE 6.0 06/04/2021 1456   GLUCOSEU NEGATIVE 06/04/2021 1456   HGBUR MODERATE (A) 06/04/2021 1456   BILIRUBINUR NEGATIVE 06/04/2021 1456   BILIRUBINUR Negative 07/02/2020 [place] 06/04/2021 1456   PROTEINUR NEGATIVE 06/04/2021 1456   NITRITE NEGATIVE 06/04/2021 1456   LEUKOCYTESUR MODERATE (A) 06/04/2021 1456   Sepsis Labs Recent Labs  Lab 09/16/22 1329 09/19/22 0417  WBC 6.3 6.3    Microbiology Recent Results (from the past 240 hour(s))  SARS Coronavirus 2 by RT PCR (hospital order, performed in [place] hospital lab) *cepheid single result test* Anterior Nasal Swab     Status: None   Collection Time: 09/17/22 10:54 AM   Specimen: Anterior Nasal Swab  Result Value Ref Range Status   SARS Coronavirus 2 by RT PCR NEGATIVE NEGATIVE Final    Comment: (NOTE) SARS-CoV-2 target nucleic acids are NOT DETECTED.  The SARS-CoV-2 RNA is generally detectable in upper and lower respiratory specimens during the acute phase of infection. The lowest concentration of SARS-CoV-2 viral copies this assay can detect is 250 copies / mL. A negative result does not preclude SARS-CoV-2 infection and should not be used as the sole basis for treatment or other patient management decisions.  A negative result may occur with improper specimen collection / handling, submission of specimen other than nasopharyngeal swab, presence of viral mutation(s) within the areas targeted by this assay, and inadequate number of viral copies (<250 copies / mL). A negative result must be combined with clinical observations, patient history, and epidemiological information.  Fact Sheet for Patients:   https://www.patel.info/  Fact Sheet for Healthcare Providers: https://hall.com/  This test is not yet approved or  cleared by the [place] FDA and has been authorized for detection and/or diagnosis of SARS-CoV-2 by FDA under an Emergency Use Authorization (EUA).  This EUA will remain in effect (meaning this test can be used) for the duration of the COVID-19 declaration under Section 564(b)(1) of the Act, 21  U.S.C. section 360bbb-3(b)(1), unless the authorization is terminated or revoked sooner.  Performed at [facility], [address], [place], [place] [address]      Time coordinating discharge: Over 30 minutes  SIGNED:   Nolberto Hanlon, MD  [facility] 09/19/2022, 2:10 PM Pager   If 7PM-7AM, please contact night-coverage [email or web address] Password [email or web address]

## 2022-09-19 NOTE — Progress Notes (Signed)
Rounding Note    Patient Name: Anita [name] Date of Encounter: 09/19/2022  [place] Cardiologist: Buford Dresser, MD   Subjective   Sitting in bed, feels well no complaints Walking with a walker, some gait instability, chronic issue Abdomen feels very flat, soft Reports that she takes torsemide once or twice per week as needed at home Telemetry reviewed, normal sinus rhythm with no SVT  Inpatient Medications    Scheduled Meds:  amiodarone  400 mg Oral BID   aspirin EC  81 mg Oral Daily   atorvastatin  20 mg Oral QHS   DULoxetine  20 mg Oral Daily   enoxaparin (LOVENOX) injection  30 mg Subcutaneous Q24H   feeding supplement  237 mL Oral BID BM   ferrous sulfate  325 mg Oral BID WC   fluticasone furoate-vilanterol  1 puff Inhalation Daily   loratadine  10 mg Oral Daily   metoprolol succinate  25 mg Oral Daily   pantoprazole  40 mg Oral Daily   sodium chloride flush  3 mL Intravenous Q12H   Continuous Infusions:  sodium chloride     PRN Meds: sodium chloride, acetaminophen, albuterol, doxylamine (Sleep), guaiFENesin, nicotine, nitroGLYCERIN, ondansetron (ZOFRAN) IV, sodium chloride flush, trolamine salicylate   Vital Signs    Vitals:   09/18/22 1631 09/18/22 1947 09/19/22 0402 09/19/22 0804  BP: 113/80 124/84 120/85 126/84  Pulse: 77 75 74 72  Resp: '19 18 16 20  '$ Temp: 97.6 F (36.4 C) 97.6 F (36.4 C) 97.7 F (36.5 C) 98.3 F (36.8 C)  TempSrc:  Oral Oral   SpO2: 99% 97% 97% 97%  Weight:      Height:        Intake/Output Summary (Last 24 hours) at 09/19/2022 1125 Last data filed at 09/18/2022 1134 Gross per 24 hour  Intake 192.73 ml  Output --  Net 192.73 ml      09/18/2022    4:20 AM 09/16/2022    8:14 PM 09/16/2022   11:28 AM  Last 3 Weights  Weight (lbs) 126 lb 12.2 oz 120 lb 120 lb  Weight (kg) 57.5 kg 54.432 kg 54.432 kg      Telemetry    Normal sinus rhythm- Personally Reviewed  ECG     - Personally  Reviewed  Physical Exam   GEN: No acute distress.   Neck: No JVD Cardiac: RRR, no murmurs, rubs, or gallops.  Respiratory: Clear to auscultation bilaterally. GI: Soft, nontender, non-distended  MS: No edema; No deformity. Neuro:  Nonfocal  Psych: Normal affect   Labs    High Sensitivity Troponin:   Recent Labs  Lab 08/21/22 1705 08/21/22 1840 08/23/22 0534  TROPONINIHS 601* 521* 122*     Chemistry Recent Labs  Lab 09/16/22 1329 09/17/22 0529 09/18/22 0402 09/19/22 0417  NA 139 137 138 134*  K 3.2* 3.0* 4.3 3.9  CL 104 107 107 104  CO2 '24 22 22 '$ 20*  GLUCOSE 95 80 102* 85  BUN 32* 30* 36* 42*  CREATININE 1.65* 1.48* 1.91* 2.15*  CALCIUM 8.9 7.9* 8.8* 9.0  MG 1.6* 1.9  --   --   PROT 7.9  --   --   --   ALBUMIN 3.6  --   --  3.5  AST 24  --   --   --   ALT 15  --   --   --   ALKPHOS 115  --   --   --   BILITOT  1.2  --   --   --   GFRNONAA 36* 41* 30* 26*  ANIONGAP '11 8 9 10    '$ Lipids No results for input(s): "CHOL", "TRIG", "HDL", "LABVLDL", "LDLCALC", "CHOLHDL" in the last 168 hours.  Hematology Recent Labs  Lab 09/16/22 1329 09/19/22 0417  WBC 6.3 6.3  RBC 3.86* 4.16  HGB 10.6* 11.2*  HCT 34.5* 36.2  MCV 89.4 87.0  MCH 27.5 26.9  MCHC 30.7 30.9  RDW 18.2* 18.4*  PLT 231 236   Thyroid No results for input(s): "TSH", "FREET4" in the last 168 hours.  BNP Recent Labs  Lab 09/16/22 1329  BNP 4,403.2*    DDimer No results for input(s): "DDIMER" in the last 168 hours.   Radiology    No results found.  Cardiac Studies   Echo 08/22/2022  1. Left ventricular ejection fraction, by estimation, is 30 to 35%. The  left ventricle has moderately decreased function. The left ventricle  demonstrates global hypokinesis. There is mild left ventricular  hypertrophy. Left ventricular diastolic  parameters are indeterminate.   2. Right ventricular systolic function is severely reduced. The right  ventricular size is moderately enlarged. Tricuspid  regurgitation signal is  inadequate for assessing PA pressure.   3. Right atrial size was moderately dilated.   4. The mitral valve is normal in structure. Severe mitral valve  regurgitation. No evidence of mitral stenosis.   5. Tricuspid valve regurgitation is severe.   6. The aortic valve is normal in structure. Aortic valve regurgitation is  mild. Aortic valve sclerosis/calcification is present, without any  evidence of aortic stenosis.   7. The inferior vena cava is normal in size with greater than 50%  respiratory variability, suggesting right atrial pressure of 3 mmHg.   Patient Profile     61 year old female with history of CAD(CTO-RCA, moderate LAD, moderate left circumflex disease), paroxysmal SVT, severe MR, multiple sclerosis, ischemic cardiomyopathy EF 30 to 35%, former smoker x20 years who presents due to weakness and SVT.   Assessment & Plan    Paroxysmal SVT Previously noted on cardiac monitor October 2022 Was at primary care physician's office noted to be in SVT, EMS gave adenosine 6/12, normal sinus rhythm restored 7 AM September 17, 2022 redeveloped SVT rates 150 up to 160, started on Cardizem infusion given carvedilol Given adenosine IV push, 6/12, back to normal sinus rhythm Recurrent SVT noted yesterday afternoon 4 PM requiring adenosine 6/12 at which time amiodarone was initiated -Continue metoprolol succinate in the setting of cardiomyopathy amiodarone 400 twice daily for 4 more days then 200 BID -Outpt follow-up with EP for consideration of ablation   Cardiomyopathy History of coronary artery disease, CTO of the RCA, moderate LAD and left circumflex disease Ejection fraction 30 to 35% Continue metoprolol succinate 25 daily Blood pressure low limiting addition of ACE/ARB/Entresto Hold torsemide 3-4 days then resume torsemide 20 mg daily as needed for weight gain/ABD swelling   Severe mitral valve regurgitation Will defer management back to primary  cardiologist, notes indicating patient has declined surgery and may not be a candidate    Total encounter time more than 35 minutes  Greater than 50% was spent in counseling and coordination of care with the patient    For questions or updates, please contact [place] Please consult [email or web address] for contact info under        Signed, Ida Rogue, MD  09/19/2022, 11:25 AM

## 2022-09-19 NOTE — Evaluation (Signed)
Physical Therapy Evaluation Patient Details Name: Anita [name] MRN: [id number] DOB: [birth year] Today's Date: 09/19/2022  History of Present Illness  Pt is a 61 y/o F admitted on 09/16/22 after presenting with SVT. Pt is being treated for acute on chronic combined systolic & diastolic CHF, CAD, pulmonary HTN, ischemic cardiomyopathy, mitral valve insufficiency, and hypervolemia. PMH: MS, systolic & diastolic heart faiulre  Clinical Impression  Pt seen for PT evaluation with pt agreeable to tx. Pt reports she lives with her daughter who works from home. Pt reports she's ambulatory with rollator & requires assistance to negotiate single step into home 2/2 decreased balance. On this date, pt is able to complete supine<>sit with supervision with use of bed rails & HOB elevated PRN. Pt with decreased willingness to follow instructional/educational cuing from PT re: hand placement during STS transfers but is able to transfer with CGA<>supervision. Pt ambulates to door & back with RW & CGA with gait pattern as noted below. Pt does endorse dizziness during gait but HR 85 bpm (MD notified), pt reports this has been chronically occurring for a few years. Pt reports she was about to resume HHPT services following last hospital admission. Continue ot recommend HHPT f/u upon d/c. Will continue to follow pt acutely to address balance, strengthening, and gait with LRAD.       Recommendations for follow up therapy are one component of a multi-disciplinary discharge planning process, led by the attending physician.  Recommendations may be updated based on patient status, additional functional criteria and insurance authorization.  Follow Up Recommendations Home health PT Can patient physically be transported by private vehicle: Yes    Assistance Recommended at Discharge Intermittent Supervision/Assistance  Patient can return home with the following  A little help with walking and/or transfers;A little help  with bathing/dressing/bathroom;Assist for transportation;Help with stairs or ramp for entrance    Equipment Recommendations None recommended by PT  Recommendations for Other Services       Functional Status Assessment Patient has had a recent decline in their functional status and demonstrates the ability to make significant improvements in function in a reasonable and predictable amount of time.     Precautions / Restrictions Precautions Precautions: Fall Restrictions Weight Bearing Restrictions: No      Mobility  Bed Mobility Overal bed mobility: Needs Assistance Bed Mobility: Supine to Sit, Sit to Supine     Supine to sit: Supervision, HOB elevated Sit to supine: Supervision   General bed mobility comments: use of bed rails    Transfers Overall transfer level: Needs assistance Equipment used: Rolling walker (2 wheels) Transfers: Sit to/from Stand Sit to Stand: Supervision, Min guard           General transfer comment: Educational cuing re: safe hand placement during STS but pt states "I'll do it my way". Pulls up on RW & significant posterior lean onto bed with BLE.    Ambulation/Gait Ambulation/Gait assistance: Min guard Gait Distance (Feet): 15 Feet Assistive device: Rolling walker (2 wheels) Gait Pattern/deviations: Decreased step length - right, Decreased step length - left, Decreased dorsiflexion - right, Decreased dorsiflexion - left, Decreased stride length, Wide base of support Gait velocity: decreased     General Gait Details: decreased dorsiflexion LLE during swing phase, decreased heel strike BLE, decreased hip/knee flexion during LLE swing phase. Pushes RW out in front of her despite cuing to ambulate within base of AD.  Stairs            Emergency planning/management officer  Modified Rankin (Stroke Patients Only)       Balance Overall balance assessment: Needs assistance Sitting-balance support: Bilateral upper extremity supported, Feet  unsupported Sitting balance-Leahy Scale: Fair Sitting balance - Comments: supervision static sitting EOB   Standing balance support: Bilateral upper extremity supported, During functional activity, Reliant on assistive device for balance Standing balance-Leahy Scale: Fair                               Pertinent Vitals/Pain Pain Assessment Pain Assessment: No/denies pain    Home Living Family/patient expects to be discharged to:: Private residence Living Arrangements: Children Available Help at Discharge: Family;Available 24 hours/day Type of Home: House Home Access: Stairs to enter Entrance Stairs-Rails: None Entrance Stairs-Number of Steps: 1   Home Layout: One level Home Equipment: Public relations account executive (2 wheels);Rollator (4 wheels);Shower seat;Cane - single point;Wheelchair - manual Additional Comments: Lives with daughter who works from home.    Prior Function               Mobility Comments: Pt reports she's ambulatory with rollater, denies falls but endorses decreased balance when negotiating single step into home. ADLs Comments: Uses brief 2/2 urinary urgency/incontinence, continent BMs at baseline.     Hand Dominance        Extremity/Trunk Assessment   Upper Extremity Assessment Upper Extremity Assessment: Generalized weakness    Lower Extremity Assessment Lower Extremity Assessment: Generalized weakness       Communication   Communication: No difficulties  Cognition Arousal/Alertness: Awake/alert Behavior During Therapy: WFL for tasks assessed/performed, Agitated Overall Cognitive Status: Within Functional Limits for tasks assessed                                          General Comments General comments (skin integrity, edema, etc.): HR 73 bpm, up to 85 bpm with gait. Pt endorses dizziness with gait but reports this is chronic for a few years.    Exercises     Assessment/Plan    PT Assessment Patient needs  continued PT services  PT Problem List Decreased strength;Decreased mobility;Decreased activity tolerance;Decreased balance;Decreased coordination;Decreased knowledge of use of DME;Decreased safety awareness       PT Treatment Interventions DME instruction;Therapeutic exercise;Gait training;Balance training;Stair training;Neuromuscular re-education;Functional mobility training;Therapeutic activities;Patient/family education    PT Goals (Current goals can be found in the Care Plan section)  Acute Rehab PT Goals Patient Stated Goal: go home PT Goal Formulation: With patient Time For Goal Achievement: 10/03/22 Potential to Achieve Goals: Good    Frequency Min 2X/week     Co-evaluation               AM-PAC PT "6 Clicks" Mobility  Outcome Measure Help needed turning from your back to your side while in a flat bed without using bedrails?: None Help needed moving from lying on your back to sitting on the side of a flat bed without using bedrails?: A Little Help needed moving to and from a bed to a chair (including a wheelchair)?: A Little Help needed standing up from a chair using your arms (e.g., wheelchair or bedside chair)?: A Little Help needed to walk in hospital room?: A Little Help needed climbing 3-5 steps with a railing? : A Lot 6 Click Score: 18    End of Session   Activity Tolerance: Patient tolerated treatment well  Patient left: in bed;with call bell/phone within reach;with bed alarm set   PT Visit Diagnosis: Unsteadiness on feet (R26.81);Muscle weakness (generalized) (M62.81)    Time: 6301-6010 PT Time Calculation (min) (ACUTE ONLY): 9 min   Charges:   PT Evaluation $PT Eval Moderate Complexity: [place], PT, DPT 09/19/22, 2:16 PM   Waunita Schooner 09/19/2022, 2:14 PM

## 2022-09-19 NOTE — Consult Note (Signed)
   [facility] CM Inpatient Consult   09/19/2022  Anita [name] 01-26-1961 [id number]   [place] [facility] [ACO] Patient: [name] remote coverage review for [facility]    Primary Care Provider: [name], NP, [facility], is an provider with a Care Management team and program and is listed for the [facility] follow up needs     Patient screened for less than 30 days readmission hospitalization with noted extreme high risk score for unplanned readmission risk.  Admitted with SVT  Reviewed to assess for potential [place] Management service needs for post hospital transition for readmission prevention needs.  Review of patient's medical record reveals patient is for home with home health.     Plan:  Patient to be followed by primary care provider listed for [facility] for post hospital follow up. No other needs assessed   For questions contact:    Natividad Brood, RN BSN [place] [facility] Liaison  [phone] business mobile phone Toll free office [phone]  Fax number: [phone] [email or web address]

## 2022-09-21 NOTE — Progress Notes (Unsigned)
Office Visit    Patient Name: Anita [name] Date of Encounter: 09/22/2022  PCP:  [name], NP   [place]  Cardiologist:  Buford Dresser, MD  Advanced Practice Provider:  No care team member to display Electrophysiologist:  None    Chief Complaint    Anita [name] is a 61 y.o. female presents today for hospital follow up   Past Medical History    Past Medical History:  Diagnosis Date   Abnormality of gait 07/26/2013   Acute respiratory failure with hypoxia ([place]) 03/20/2020   Allergy    Arthritis    Atypical pneumonia 03/22/2020   CAD (coronary artery disease)    a. 03/2020 Cath: LM nl, LAD 60/32m 55d, D1 70, D2 70, LCX 45ost/p, 65p, RCA 100ost CTO. RPDA fills via collats from dLAD. Inf septal fills via collats from 1st septal, RPAV fills via collats from LPAV, RPL1/2 sev dzs-->med rx.   Chickenpox    Chronic combined systolic (congestive) and diastolic (congestive) heart failure ([place]    a. 02/2021 Echo: EF 40-45%, glob HK, gr2 DD. Sev red RV fxn, RVSP 69.258mg. Mod dil LA, sev dil RA. Sev MR. Mild to mod MS. Mean MV grad 6.85m53m. Mod-Sev TR. Mild AI. Mild Ao sclerosis w/o stenosis.   COPD (chronic obstructive pulmonary disease) (HCC)    Elevated troponin 03/22/2020   GERD (gastroesophageal reflux disease)    Headache(784.0)    Migraine   Hearing loss    Right ear secondary to infection   History of shingles    Ischemic cardiomyopathy    a. 02/2021 Echo: EF 40-45%, glob HK.   Migraines    Multiple sclerosis (HCC)    Obese    Optic neuritis    [place]  a. 02/2021 Echo: RVSP 69.2mm26m   Prolonged QT interval 03/20/2020   Severe mitral regurgitation    a. 02/2021 Echo: Severe MR w/ mild to mod MS.  Mean MV grad 6.85mmH34m  Past Surgical History:  Procedure Laterality Date   COLONOSCOPY WITH PROPOFOL N/A 08/03/2020   Procedure: COLONOSCOPY WITH PROPOFOL;  Surgeon: Anna,[name]  [facility];  Service: Gastroenterology;  Laterality: N/A;   Ganglioneuroma     Resection   LEFT HEART CATH AND CORONARY ANGIOGRAPHY N/A 03/23/2020   Procedure: LEFT HEART CATH AND CORONARY ANGIOGRAPHY;  Surgeon: HardiLeonie Man  [facility];  Service: Cardiovascular;  Laterality: N/A;   PILONIDAL CYST EXCISION     PILONIDAL CYST EXCISION  1983   TUMOR REMOVAL  2007/2008    Allergies  Allergies  Allergen Reactions   Acthar Hp [Corticotropin] Other (See Comments)    Throat swelling and coughing up blood   Codeine    Prednisone     History of Present Illness    Anita [name] 61 y.67 female with a hx of MS, CAD (CTO-RCA, moderate LAD, moderate LCx disease), pSVT, severe MR with moderate mitral stenosis, COPD, tobacco use, systolic and diastolic heart failure/ICM last seen while hospitalized.  She follows with Dr. ChrisHarrell Gave has had recurrent admissions for acute on chronic combined systolic and diastolic heart failure with known severe MR and moderate MS. Not felt to be candidate for invasive procedures. She was seen in clinic 05/31/21 by Dr. ChrisHarrell Gave extensive discussion regarding goals of care - she was interested in being DNR and encouraged to discuss hospice and palliative care with  her family.   She was admitted 06/04/21 for hypotension and diarrhea concerning for melena. She had AKI and hypotension. Hemoglobin was stable and dark stools thought due to iron use. Hypotension were thought due to medication and all heart failure medications held and midodrine initiated. Hospitalization complicated by elevated liver enzymes which improved. Midodrine gradually decreased and discontinued.   Admitted 9/14-9/25/23 with acute on chronic CHF and AKI, diuresed and discharged with HHPT and OT. Readmitted 09/2022 after having SVT at PCP. EMS gave adenosine. Required Cardiazem infusion as well as adenosine while admitted. Started  on Amiodarone '400mg'$  BID for additional 4 days then '200mg'$  BID. Recommended for outpatient follow up with EP for consideration of ablation.   Presents today for follow up. Some mild nausea since starting Amiodarone, reducing to '200mg'$  BID today. Weight at home has been 122 lbs stable post discharge. Daughter notes she herself is preparing for surgery and recovery.  Discussed possible referral to EP but she prefers to continue on the amiodarone and see how she feels.  Stable exertional dyspnea.  No edema, orthopnea, PND, chest pain.  EKGs/Labs/Other Studies Reviewed:   The following studies were reviewed today:   Echo 08/22/2022  1. Left ventricular ejection fraction, by estimation, is 30 to 35%. The  left ventricle has moderately decreased function. The left ventricle  demonstrates global hypokinesis. There is mild left ventricular  hypertrophy. Left ventricular diastolic  parameters are indeterminate.   2. Right ventricular systolic function is severely reduced. The right  ventricular size is moderately enlarged. Tricuspid regurgitation signal is  inadequate for assessing PA pressure.   3. Right atrial size was moderately dilated.   4. The mitral valve is normal in structure. Severe mitral valve  regurgitation. No evidence of mitral stenosis.   5. Tricuspid valve regurgitation is severe.   6. The aortic valve is normal in structure. Aortic valve regurgitation is  mild. Aortic valve sclerosis/calcification is present, without any  evidence of aortic stenosis.   7. The inferior vena cava is normal in size with greater than 50%  respiratory variability, suggesting right atrial pressure of 3 mmHg.   Echo 3.3.22 1. Left ventricular ejection fraction, by estimation, is 40 to 45%. Left  ventricular ejection fraction by 3D volume is 47 %. The left ventricle has  mildly decreased function. The left ventricle demonstrates global  hypokinesis. Left ventricular diastolic   parameters are consistent  with Grade II diastolic dysfunction  (pseudonormalization). Elevated left ventricular end-diastolic pressure.   2. Right ventricular systolic function is severely reduced. The right  ventricular size is mildly enlarged. There is severely elevated pulmonary  artery systolic pressure. The estimated right ventricular systolic  pressure is 61.4 mmHg.   3. Left atrial size was moderately dilated.   4. Right atrial size was severely dilated.   5. The mitral valve is degenerative with mild to moderate thickening of  the mitral valve leaflets. There is teathering of the MV leaflets due to  mild LV dysfunction. Severe mitral valve regurgitation. Mild to moderate  mitral stenosis. The mean mitral  valve gradient is 6.5 mmHg.   6. Tricuspid valve regurgitation is moderate to severe.   7. The aortic valve is tricuspid. Aortic valve regurgitation is mild.  Mild aortic valve sclerosis is present, with no evidence of aortic valve  stenosis. Aortic regurgitation PHT measures 499 msec.   8. The inferior vena cava is dilated in size with <50% respiratory  variability, suggesting right atrial pressure of 15 mmHg.  9. There is a trivial pericardial effusion posterior to the left  ventricle.  10. Compared to prior echo, Mitral regurgitation appears worse and RV  dysfunction is now present. There is severe Pulmonary HTN.   Echo 03/20/20  1. Left ventricular ejection fraction, by estimation, is 40 to 45%. The  left ventricle has mildly decreased function. The left ventricle  demonstrates global hypokinesis. The left ventricular internal cavity size  was mildly dilated. Left ventricular  diastolic function could not be evaluated.   2. Right ventricular systolic function is normal. The right ventricular  size is normal. There is moderately elevated pulmonary artery systolic  pressure.   3. Left atrial size was mildly dilated.   4. Moderate mitral subvalvular thickening/fibrosis.   5. The mitral valve is  rheumatic. Moderate to severe mitral valve  regurgitation. Mild mitral stenosis. The mean mitral valve gradient is 7.3  mmHg with average heart rate of 105 bpm.   6. The aortic valve is normal in structure. Aortic valve regurgitation is  not visualized.   7. The inferior vena cava is dilated in size with <50% respiratory  variability, suggesting right atrial pressure of 15 mmHg.   Cath 03/23/20 Hemodynamics: LV end diastolic pressure is moderately elevated. ----Coronary Angiography----- Ost RCA to Dist RCA lesion is 100% stenosed.-The PDA and PL system fills via faint collaterals from the AV groove LCx and LAD septals. Mid LAD-1 lesion is 60% stenosed. Mid LAD-2 lesion is 50% stenosed. Dist LAD lesion is 55% stenosed with 70% stenosed side branch in 2nd Diag. 1st Diag lesion is 70% stenosed. Ost Cx to Prox Cx lesion is 45% stenosed. Prox-MID Cx lesion is 65% stenosed.   MODERATE-SEVERE THREE-VESSEL CAD: 100% proximal RCA occlusion with left-to-right collaterals faintly filling PDA and PL system (AV groove LCx-PL and LAD septal-PDA) Diffuse moderate mid LAD disease with 60% to 50% stenosis. Tandem 50% and 65% proximal and mid LCx with the 65% lesion being the most significant lesion. Moderately elevated LVEDP of 18-20 mmHg   Given the extent of disease in the LAD and LCx, neither lesion is very amenable to PCI, and RCA is chronically occluded.  Given her comorbidities, I do not think she would be a good CABG candidate especially in light of the fact that she would likely require valve surgery as well.  She would not recover well.   Recommendations aggressive medical management.  EKG:  No EKG today  Recent Labs: 09/16/2022: ALT 15; B Natriuretic Peptide 4,403.2 09/17/2022: Magnesium 1.9 09/19/2022: BUN 42; Creatinine, Ser 2.15; Hemoglobin 11.2; Platelets 236; Potassium 3.9; Sodium 134  Recent Lipid Panel    Component Value Date/Time   CHOL 104 01/08/2021 1016   TRIG 66.0 01/08/2021  1016   HDL 40.20 01/08/2021 1016   CHOLHDL 3 01/08/2021 1016   VLDL 13.2 01/08/2021 1016   LDLCALC 51 01/08/2021 1016    Home Medications   Current Meds  Medication Sig   albuterol (VENTOLIN HFA) 108 (90 Base) MCG/ACT inhaler INHALE 1-2 PUFFS INTO THE LUNGS EVERY 6 (SIX) HOURS AS NEEDED FOR WHEEZING OR SHORTNESS OF BREATH. USE SPARINGLY!   aspirin EC 81 MG tablet Take 1 tablet (81 mg total) by mouth daily. Swallow whole.   atorvastatin (LIPITOR) 20 MG tablet Take 1 tablet (20 mg total) by mouth at bedtime.   cetirizine (ZYRTEC) 10 MG tablet Take 10 mg by mouth daily.   DULoxetine (CYMBALTA) 20 MG capsule Take 1 capsule (20 mg total) by mouth daily. For anxiety and pain.  feeding supplement, ENSURE ENLIVE, (ENSURE ENLIVE) LIQD Take 237 mLs by mouth 2 (two) times daily between meals.   ferrous sulfate 325 (65 FE) MG EC tablet Take 1 tablet (325 mg total) by mouth 2 (two) times daily with a meal.   fluticasone-salmeterol (ADVAIR DISKUS) 250-50 MCG/ACT AEPB INHALE 1 PUFF INTO THE LUNGS TWICE A DAY   guaiFENesin (MUCINEX) 600 MG 12 hr tablet Take 600 mg by mouth 2 (two) times daily as needed for cough.   metoprolol succinate (TOPROL-XL) 25 MG 24 hr tablet Take 1 tablet (25 mg total) by mouth daily. Take with or immediately following a meal.   nitroGLYCERIN (NITROSTAT) 0.4 MG SL tablet Place 1 tablet (0.4 mg total) under the tongue every 5 (five) minutes as needed for chest pain.   omeprazole (PRILOSEC) 40 MG capsule Take 1 capsule (40 mg total) by mouth daily.   Pediatric Multiple Vitamins (FLINTSTONES MULTIVITAMIN) CHEW Chew by mouth.   Probiotic Product (PROBIOTIC ADVANCED) CAPS Take 1 capsule by mouth daily.   torsemide (DEMADEX) 20 MG tablet Take for weight gain of 2 lbs in 1 day or 5 lbs in 1 week or obvious swelling.   trolamine salicylate (ASPERCREME) 10 % cream Apply 1 application. topically daily as needed for muscle pain (back or joint pain).   vitamin B-12 (CYANOCOBALAMIN) 500 MCG  tablet Take 500 mcg by mouth 4 (four) times a week. 4 times weekly no more than 2000 MCG per week   [DISCONTINUED] amiodarone (PACERONE) 200 MG tablet Take 1 tablet (200 mg total) by mouth 2 (two) times daily for 42 doses.     Review of Systems      All other systems reviewed and are otherwise negative except as noted above.  Physical Exam    VS:  BP 120/70   Pulse 63   Ht '5\' 3"'$  (1.6 m)   Wt 126 lb (57.2 kg)   BMI 22.32 kg/m  , BMI Body mass index is 22.32 kg/m.  Wt Readings from Last 3 Encounters:  09/22/22 126 lb (57.2 kg)  09/18/22 126 lb 12.2 oz (57.5 kg)  09/16/22 120 lb (54.4 kg)   GEN: Well nourished, well developed, in no acute distress. HEENT: normal. Neck: Supple, no JVD, carotid bruits, or masses. Cardiac: RRR, no  rubs, or gallops. Murmur.  No clubbing, cyanosis, edema.  Radials/PT 2+ and equal bilaterally.  Respiratory:  Respirations regular and unlabored, clear to auscultation bilaterally. GI: Soft, nontender, nondistended. MS: No deformity or atrophy. Skin: Warm and dry, no rash. Neuro:  Strength and sensation are intact. Psych: Normal affect.  Assessment & Plan    SVT / On Amiodarone therapy- Recent admission. Some nausea with Amiodarone '400mg'$  BID - decreasing to '200mg'$  BID today. Hopeful this will improve nausea. Continue Toprol '25mg'$  QD. Discussed possible referral to EP today - she wishes to defer. Plan for Amiodarone monitoring labs next month at [facility].   Combined systolic and diastolic heart failure / Severe MR / Moderate MS - Not surgical candidate.  GDMT limited by hypotension.  Continue PRN torsemide. Follows with palliative care. Low sodium diet, fluid restriction <2L, and daily weights encouraged. Educated to contact our office for weight gain of 2 lbs overnight or 5 lbs in one week.   CKD IIIa - Careful titration of diuretic and antihypertensive.    HLD - Previous transaminitis resolved. Continue Atorvastatin '20mg'$  QD.   CAD - Known CTO  RCA, moderate LAD and LCx disease.  Stable with no anginal  symptoms. No indication for ischemic evaluation.  GDMT Aspirin, Atorvastatin.   COPD -no signs of acute exacerbation.  Continue to follow with PCP.  Multiple Sclerosis - Follow with PCP, neurology.   Abnormal para aortic lymph node / history of gangioneuroma - Continue to follow with PCP.   Disposition: Follow up  03/2023  with Dr. Harrell Gave  Signed, Loel Dubonnet, NP 09/22/2022, 9:45 AM [place]

## 2022-09-22 ENCOUNTER — Encounter (HOSPITAL_BASED_OUTPATIENT_CLINIC_OR_DEPARTMENT_OTHER): Payer: Self-pay | Admitting: Family

## 2022-09-22 ENCOUNTER — Ambulatory Visit (INDEPENDENT_AMBULATORY_CARE_PROVIDER_SITE_OTHER): Payer: Medicare Other | Admitting: Family

## 2022-09-22 ENCOUNTER — Telehealth: Payer: Self-pay

## 2022-09-22 VITALS — BP 120/70 | HR 63 | Ht 63.0 in | Wt 126.0 lb

## 2022-09-22 DIAGNOSIS — I050 Rheumatic mitral stenosis: Secondary | ICD-10-CM

## 2022-09-22 DIAGNOSIS — I340 Nonrheumatic mitral (valve) insufficiency: Secondary | ICD-10-CM

## 2022-09-22 DIAGNOSIS — I471 Supraventricular tachycardia, unspecified: Secondary | ICD-10-CM

## 2022-09-22 DIAGNOSIS — I5022 Chronic systolic (congestive) heart failure: Secondary | ICD-10-CM

## 2022-09-22 DIAGNOSIS — Z79899 Other long term (current) drug therapy: Secondary | ICD-10-CM

## 2022-09-22 MED ORDER — AMIODARONE HCL 200 MG PO TABS: 200.0000 mg | ORAL_TABLET | Freq: Two times a day (BID) | ORAL | 3 refills | Status: DC

## 2022-09-22 NOTE — Patient Instructions (Addendum)
Medication Instructions:  Continue your current medications.   *If you need a refill on your cardiac medications before your next appointment, please call your pharmacy*   Lab Work: When you see Anita [name] we will plan to collect liver panel and TSH. No need to be fasting.   Follow-Up: At [facility], you and your health needs are our priority.  As part of our continuing mission to provide you with exceptional heart care, we have created designated Provider Care Teams.  These Care Teams include your primary Cardiologist (physician) and Advanced Practice Providers (APPs -  Physician Assistants and Nurse Practitioners) who all work together to provide you with the care you need, when you need it.  We recommend signing up for the patient portal called "MyChart".  Sign up information is provided on this After Visit Summary.  MyChart is used to connect with patients for Virtual Visits (Telemedicine).  Patients are able to view lab/test results, encounter notes, upcoming appointments, etc.  Non-urgent messages can be sent to your provider as well.   To learn more about what you can do with MyChart, go to [email or web address].    Your next appointment:   03/2023 with Dr. Harrell Gave  Other Instructions  Supraventricular Tachycardia, Adult Supraventricular tachycardia (SVT) is a kind of abnormal heartbeat. It makes your heart beat very fast. This may last for a short time and then return to normal, or it may last longer. A normal resting heartbeat is 60-100 times a minute. This condition can make your heart beat more than 150 times a minute. Times of having a fast heartbeat (episodes) can be scary, but they are usually not dangerous. In some cases, they may lead to heart failure if they: Happen many times a day. Last longer than a few seconds. What are the causes?  This condition happens when electrical signals are sent out from areas of the heart that do not normally send signals for the  heartbeat. What increases the risk? You are more likely to develop this condition if you are: Middle aged or younger. Female. The following factors may also make you more likely to develop this condition: Stress. Feeling worried or nervous (anxiety). Tiredness. Smoking. Stimulant drugs, such as cocaine and methamphetamine. Alcohol. Caffeine. Pregnancy. Having certain medical conditions. What are the signs or symptoms? A pounding heart. A feeling that your heart is skipping beats (palpitations). Weakness. Trouble getting enough air. Pain or tightness in your chest. Dizziness or feeling like you are going to pass out (faint). Feeling worried or nervous. Sweating. Feeling like you may vomit (nausea). Passing out. Tiredness. Sometimes, there are no symptoms. How is this treated? Treatment may include: Vagal nerve stimulation. Ways to do this include: Holding your breath and pushing, as though you are pooping (having a bowel movement). Massaging an area on one side of your neck. Do not try this yourself. Only a doctor should do this. If done the wrong way, it can lead to a stroke. Bending forward with your head between your legs. Coughing while bending forward with your head between your legs. Putting an ice-cold, wet towel on your face. Medicines that prevent attacks. Medicine to stop an attack given through an IV tube at the hospital. A small electric shock (cardioversion) that stops an attack. A procedure to get rid of cells in the area that is causing the fast heartbeats (radiofrequency ablation). If you do not have symptoms, you may not need treatment. Follow these instructions at home: Stress Avoid things that make  you feel stressed. To deal with stress, try: Doing yoga or meditation. Being out in nature. Listening to relaxing music. Doing deep breathing. Taking steps to be healthy, such as getting lots of sleep, exercising, and eating a balanced diet. Talking with  a mental health doctor. Lifestyle  Try to get at least 7 hours of sleep each night. Do not smoke or use any products that contain nicotine or tobacco. If you need help quitting, ask your doctor. Do not drink alcohol if it gives you a fast heartbeat. If alcohol does not seem to give you a fast heartbeat, limit your alcohol use. If you drink alcohol: Limit how much you have to: 0-1 drink a day for women who are not pregnant. 0-2 drinks a day for men. Know how much alcohol is in your drink. In the [place] one drink equals one 12 oz bottle of beer (355 mL), one 5 oz glass of wine (148 mL), or one 1 oz glass of hard liquor (44 mL). Be aware of how caffeine affects you. If caffeine gives you a fast heartbeat, do not eat, drink, or use anything with caffeine in it. If caffeine does not seem to give you a fast heartbeat, limit how much caffeine you eat, drink, or use. Do not use stimulant drugs. If you need help quitting, ask your doctor. General instructions Stay at a healthy weight. Exercise regularly. Ask your doctor about good activities for you. Try one or a mixture of these: 150 minutes a week of gentle exercise, like walking or yoga. 75 minutes a week of exercise that is very active, like running or swimming. Do vagus nerve treatments to slow down your heartbeat as told by your doctor. Take over-the-counter and prescription medicines only as told by your doctor. Keep all follow-up visits. Contact a doctor if: You have a fast heartbeat more often. Times of having a fast heartbeat last longer than before. Home treatments to slow down your heartbeat do not help. You have new symptoms. Get help right away if: You have chest pain. Your symptoms get worse. You have trouble breathing. Your heart beats very fast for more than 20 minutes. You pass out. These symptoms may be an emergency. Get medical help right away. Call your local emergency services (911 in the [place] Do not wait to see if  the symptoms will go away. Do not drive yourself to the hospital. Summary SVT is a type of abnormal heartbeat. This condition can make your heart beat more than 150 times a minute. If you do not have symptoms, you may not need treatment. This information is not intended to replace advice given to you by your health care provider. Make sure you discuss any questions you have with your health care provider. Document Revised: 07/07/2020 Document Reviewed: 07/07/2020 Elsevier Patient Education  [place].

## 2022-09-22 NOTE — Telephone Encounter (Signed)
Transition Care Management Unsuccessful Follow-up Telephone Call  Date of discharge and from where:   09/19/2022  Attempts:  1st Attempt  Reason for unsuccessful TCM follow-up call:  Left voice message

## 2022-09-23 NOTE — Telephone Encounter (Signed)
Transition Care Management Follow-up Telephone Call Date of discharge and from where: [place] 09/19/2022 How have you been since you were released from the hospital? better Any questions or concerns? No  Items Reviewed: Did the pt receive and understand the discharge instructions provided? Yes  Medications obtained and verified? Yes  Other? No  Any new allergies since your discharge? Yes  Dietary orders reviewed? Yes Do you have support at home? Yes   Home Care and Equipment/Supplies: Were home health services ordered? no If so, what is the name of the agency? N/a  Has the agency set up a time to come to the patient's home? not applicable Were any new equipment or medical supplies ordered?  No What is the name of the medical supply agency? N/a Were you able to get the supplies/equipment? not applicable Do you have any questions related to the use of the equipment or supplies? No  Functional Questionnaire: (I = Independent and D = Dependent) ADLs: I  Bathing/Dressing- I  Meal Prep- D  Eating- I  Maintaining continence- D  Transferring/Ambulation- I  Managing Meds- I  Follow up appointments reviewed:  PCP Hospital f/u appt confirmed? Yes  Scheduled to see Alma Friendly on 10/07/2022 @ 7:20. [place] f/u appt confirmed? Yes  Scheduled to see Cardiology on 09/22/2022 @ 10:00. Are transportation arrangements needed? No  If their condition worsens, is the pt aware to call PCP or go to the Emergency Dept.? Yes Was the patient provided with contact information for the PCP's office or ED? Yes Was to pt encouraged to call back with questions or concerns? Yes  Juanda Crumble, LPN [place] Direct Dial [phone]

## 2022-09-24 ENCOUNTER — Encounter (HOSPITAL_BASED_OUTPATIENT_CLINIC_OR_DEPARTMENT_OTHER): Payer: Self-pay

## 2022-09-24 DIAGNOSIS — R110 Nausea: Secondary | ICD-10-CM

## 2022-09-25 MED ORDER — ONDANSETRON HCL 4 MG PO TABS: 4.0000 mg | ORAL_TABLET | Freq: Three times a day (TID) | ORAL | 0 refills | Status: DC | PRN

## 2022-09-29 ENCOUNTER — Inpatient Hospital Stay: Payer: Medicare Other | Attending: Oncology

## 2022-09-29 DIAGNOSIS — E538 Deficiency of other specified B group vitamins: Secondary | ICD-10-CM | POA: Diagnosis not present

## 2022-09-29 DIAGNOSIS — K648 Other hemorrhoids: Secondary | ICD-10-CM | POA: Insufficient documentation

## 2022-09-29 DIAGNOSIS — K219 Gastro-esophageal reflux disease without esophagitis: Secondary | ICD-10-CM | POA: Insufficient documentation

## 2022-09-29 DIAGNOSIS — G35 Multiple sclerosis: Secondary | ICD-10-CM | POA: Diagnosis not present

## 2022-09-29 DIAGNOSIS — I252 Old myocardial infarction: Secondary | ICD-10-CM | POA: Insufficient documentation

## 2022-09-29 DIAGNOSIS — Z885 Allergy status to narcotic agent status: Secondary | ICD-10-CM | POA: Insufficient documentation

## 2022-09-29 DIAGNOSIS — I129 Hypertensive chronic kidney disease with stage 1 through stage 4 chronic kidney disease, or unspecified chronic kidney disease: Secondary | ICD-10-CM | POA: Insufficient documentation

## 2022-09-29 DIAGNOSIS — J449 Chronic obstructive pulmonary disease, unspecified: Secondary | ICD-10-CM | POA: Diagnosis not present

## 2022-09-29 DIAGNOSIS — Z801 Family history of malignant neoplasm of trachea, bronchus and lung: Secondary | ICD-10-CM | POA: Diagnosis not present

## 2022-09-29 DIAGNOSIS — Z818 Family history of other mental and behavioral disorders: Secondary | ICD-10-CM | POA: Insufficient documentation

## 2022-09-29 DIAGNOSIS — I255 Ischemic cardiomyopathy: Secondary | ICD-10-CM | POA: Insufficient documentation

## 2022-09-29 DIAGNOSIS — Z888 Allergy status to other drugs, medicaments and biological substances status: Secondary | ICD-10-CM | POA: Insufficient documentation

## 2022-09-29 DIAGNOSIS — I2510 Atherosclerotic heart disease of native coronary artery without angina pectoris: Secondary | ICD-10-CM | POA: Diagnosis not present

## 2022-09-29 DIAGNOSIS — Z79899 Other long term (current) drug therapy: Secondary | ICD-10-CM | POA: Insufficient documentation

## 2022-09-29 DIAGNOSIS — R5383 Other fatigue: Secondary | ICD-10-CM | POA: Insufficient documentation

## 2022-09-29 DIAGNOSIS — Z8049 Family history of malignant neoplasm of other genital organs: Secondary | ICD-10-CM | POA: Diagnosis not present

## 2022-09-29 DIAGNOSIS — R110 Nausea: Secondary | ICD-10-CM | POA: Diagnosis not present

## 2022-09-29 DIAGNOSIS — N1831 Chronic kidney disease, stage 3a: Secondary | ICD-10-CM | POA: Insufficient documentation

## 2022-09-29 DIAGNOSIS — Z808 Family history of malignant neoplasm of other organs or systems: Secondary | ICD-10-CM | POA: Diagnosis not present

## 2022-09-29 DIAGNOSIS — D508 Other iron deficiency anemias: Secondary | ICD-10-CM

## 2022-09-29 DIAGNOSIS — I2721 Secondary pulmonary arterial hypertension: Secondary | ICD-10-CM | POA: Insufficient documentation

## 2022-09-29 DIAGNOSIS — Z8249 Family history of ischemic heart disease and other diseases of the circulatory system: Secondary | ICD-10-CM | POA: Diagnosis not present

## 2022-09-29 DIAGNOSIS — R1909 Other intra-abdominal and pelvic swelling, mass and lump: Secondary | ICD-10-CM | POA: Diagnosis not present

## 2022-09-29 DIAGNOSIS — D631 Anemia in chronic kidney disease: Secondary | ICD-10-CM | POA: Diagnosis not present

## 2022-09-29 LAB — IRON AND TIBC
Iron: 50 ug/dL (ref 28–170)
Saturation Ratios: 16 % (ref 10.4–31.8)
TIBC: 315 ug/dL (ref 250–450)
UIBC: 265 ug/dL

## 2022-09-29 LAB — CBC WITH DIFFERENTIAL/PLATELET
Abs Immature Granulocytes: 0.04 10*3/uL (ref 0.00–0.07)
Basophils Absolute: 0.1 10*3/uL (ref 0.0–0.1)
Basophils Relative: 1 %
Eosinophils Absolute: 0.1 10*3/uL (ref 0.0–0.5)
Eosinophils Relative: 2 %
HCT: 37.5 % (ref 36.0–46.0)
Hemoglobin: 11.4 g/dL — ABNORMAL LOW (ref 12.0–15.0)
Immature Granulocytes: 1 %
Lymphocytes Relative: 7 %
Lymphs Abs: 0.5 10*3/uL — ABNORMAL LOW (ref 0.7–4.0)
MCH: 28.1 pg (ref 26.0–34.0)
MCHC: 30.4 g/dL (ref 30.0–36.0)
MCV: 92.4 fL (ref 80.0–100.0)
Monocytes Absolute: 0.7 10*3/uL (ref 0.1–1.0)
Monocytes Relative: 10 %
Neutro Abs: 5.7 10*3/uL (ref 1.7–7.7)
Neutrophils Relative %: 79 %
Platelets: 173 10*3/uL (ref 150–400)
RBC: 4.06 MIL/uL (ref 3.87–5.11)
RDW: 20.3 % — ABNORMAL HIGH (ref 11.5–15.5)
WBC: 7.2 10*3/uL (ref 4.0–10.5)
nRBC: 0 % (ref 0.0–0.2)

## 2022-09-29 LAB — FERRITIN: Ferritin: 117 ng/mL (ref 11–307)

## 2022-09-29 LAB — VITAMIN B12: Vitamin B-12: 656 pg/mL (ref 180–914)

## 2022-09-30 ENCOUNTER — Ambulatory Visit: Payer: Medicare Other | Admitting: Family

## 2022-10-01 ENCOUNTER — Encounter: Payer: Self-pay | Admitting: Oncology

## 2022-10-01 ENCOUNTER — Inpatient Hospital Stay (HOSPITAL_BASED_OUTPATIENT_CLINIC_OR_DEPARTMENT_OTHER): Payer: Medicare Other | Admitting: Oncology

## 2022-10-01 VITALS — BP 142/73 | HR 69 | Temp 98.0°F | Resp 18 | Wt 135.1 lb

## 2022-10-01 DIAGNOSIS — I2721 Secondary pulmonary arterial hypertension: Secondary | ICD-10-CM | POA: Diagnosis not present

## 2022-10-01 DIAGNOSIS — D631 Anemia in chronic kidney disease: Secondary | ICD-10-CM | POA: Diagnosis not present

## 2022-10-01 DIAGNOSIS — N1831 Chronic kidney disease, stage 3a: Secondary | ICD-10-CM | POA: Insufficient documentation

## 2022-10-01 DIAGNOSIS — E538 Deficiency of other specified B group vitamins: Secondary | ICD-10-CM | POA: Diagnosis not present

## 2022-10-01 DIAGNOSIS — I252 Old myocardial infarction: Secondary | ICD-10-CM | POA: Diagnosis not present

## 2022-10-01 DIAGNOSIS — I129 Hypertensive chronic kidney disease with stage 1 through stage 4 chronic kidney disease, or unspecified chronic kidney disease: Secondary | ICD-10-CM | POA: Diagnosis not present

## 2022-10-01 MED ORDER — FERROUS SULFATE 325 (65 FE) MG PO TBEC: 1.0000 | DELAYED_RELEASE_TABLET | Freq: Every day | ORAL | 3 refills | Status: DC

## 2022-10-01 NOTE — Progress Notes (Signed)
Hematology/Oncology Progress note [phone] [phone]      Patient Care Team: [name], NP as PCP - General (Internal Medicine) Buford Dresser, MD as PCP - Cardiology (Cardiology) Earlie Server, MD as Consulting Physician (Hematology and Oncology) Pieter Partridge, DO as Consulting Physician (Neurology)  ASSESSMENT & PLAN:   Anemia due to stage 3a chronic kidney disease ([place]) #Anemia, combination of iron deficiency as well as chronic kidney disease. Labs reviewed and discussed with patient. Hemoglobin is stable.  Above 10.  No need for erythropoietin therapy. Iron panel is stable. Recommend patient to continue ferrous sulfate 325 mg daily.  Over-the-counter vitamin C supplementation.  Vitamin B12 deficiency continue vitamin B12  2-3 times per week.   Orders Placed This Encounter  Procedures   CBC with Differential/Platelet    Standing Status:   Future    Standing Expiration Date:   10/01/2023   Ferritin    Standing Status:   Future    Standing Expiration Date:   10/02/2023   Iron and TIBC([facility])    Standing Status:   Future    Standing Expiration Date:   10/02/2023   Vitamin B12    Standing Status:   Future    Standing Expiration Date:   10/02/2023   Follow-up in 6 months. All questions were answered. The patient knows to call the clinic with any problems, questions or concerns.  Earlie Server, MD, PhD [facility] Hematology Oncology 10/01/2022    CHIEF COMPLAINTS/REASON FOR VISIT:  Follow up of anemia  HISTORY OF PRESENTING ILLNESS:  Anita [name] is a  61 y.o.  female with PMH listed below who was referred to me for evaluation of anemia Reviewed patient's recent labs that was done.  05/03/2020 labs revealed anemia with hemoglobin of 8.9, MCV 82.9 07/02/2020, iron saturation 11, ferritin 91, TIBC 307, iron 34. Reviewed patient's previous labs , anemia is chronic onset , duration is since April 2021.  She did not  have much baseline blood work prior to April 2021.  She did have some remote blood work that was done in 2012 when she had a normal hemoglobin of 13.5. Patient has multiple medical problems.  She is a poor historian.  Daughter provides most of her 27. Patient was hospitalized in April 2021 due to acute respiratory failure with hypoxia, due to atypical pneumonia, non-STEMI, severe three-vessel disease and pulmonary hypertension, AKI. 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 Eston Esters in 2007.  Patient declined pursuing further diagnosis or therapeutic options. She also has a chronic history of MS.  Patient was recently seen by gastroenterology and had colonoscopy done. 5 mm transverse colon polyp which was resected and retrieved.  Diverticulosis.  Nonbleeding internal hemorrhoids. Patient denies any bloody or black bowel movement.  She continues to have chronic nausea with no vomiting. Appetite is not good.  Weight has been relatively stable.  She also reports bilateral upper extremity ecchymosis which is a chronic issue for her.    INTERVAL HISTORY Anita [name] is a 61 y.o. female who has above history reviewed by me today presents for follow up visit for management of Anemia Patient was accompanied by daughter. Patient has no new complaints.   Review of Systems  Constitutional:  Positive for fatigue. Negative for appetite change, chills, fever and unexpected weight change.  HENT:   Negative for hearing loss and voice  change.   Eyes:  Negative for eye problems.  Respiratory:  Negative for chest tightness and cough.   Cardiovascular:  Negative for chest pain.  Gastrointestinal:  Negative for abdominal distention, abdominal pain, blood in stool and nausea.  Endocrine: Negative for hot flashes.  Genitourinary:  Negative for difficulty urinating and frequency.    Musculoskeletal:  Negative for arthralgias.  Skin:  Negative for itching and rash.  Neurological:  Negative for extremity weakness.  Hematological:  Negative for adenopathy. Bruises/bleeds easily.  Psychiatric/Behavioral:  Negative for confusion.      MEDICAL HISTORY:  Past Medical History:  Diagnosis Date   Abnormality of gait 07/26/2013   Acute respiratory failure with hypoxia ([place]) 03/20/2020   Allergy    Arthritis    Atypical pneumonia 03/22/2020   CAD (coronary artery disease)    a. 03/2020 Cath: LM nl, LAD 60/96m 55d, D1 70, D2 70, LCX 45ost/p, 65p, RCA 100ost CTO. RPDA fills via collats from dLAD. Inf septal fills via collats from 1st septal, RPAV fills via collats from LPAV, RPL1/2 sev dzs-->med rx.   Chickenpox    Chronic combined systolic (congestive) and diastolic (congestive) heart failure ([facility]    a. 02/2021 Echo: EF 40-45%, glob HK, gr2 DD. Sev red RV fxn, RVSP 69.2773mg. Mod dil LA, sev dil RA. Sev MR. Mild to mod MS. Mean MV grad 6.73m69m. Mod-Sev TR. Mild AI. Mild Ao sclerosis w/o stenosis.   COPD (chronic obstructive pulmonary disease) (HCC)    Elevated troponin 03/22/2020   GERD (gastroesophageal reflux disease)    Headache(784.0)    Migraine   Hearing loss    Right ear secondary to infection   History of shingles    Ischemic cardiomyopathy    a. 02/2021 Echo: EF 40-45%, glob HK.   Migraines    Multiple sclerosis (HCC)    Obese    Optic neuritis    PAH (pulmonary artery hypertension) ([facility]  a. 02/2021 Echo: RVSP 69.2mm78m   Prolonged QT interval 03/20/2020   Severe mitral regurgitation    a. 02/2021 Echo: Severe MR w/ mild to mod MS.  Mean MV grad 6.73mmH773m   SURGICAL HISTORY: Past Surgical History:  Procedure Laterality Date   COLONOSCOPY WITH PROPOFOL N/A 08/03/2020   Procedure: COLONOSCOPY WITH PROPOFOL;  Surgeon: Anna,[name]  [facility];  Service: Gastroenterology;  Laterality: N/A;   Ganglioneuroma     Resection   LEFT HEART  CATH AND CORONARY ANGIOGRAPHY N/A 03/23/2020   Procedure: LEFT HEART CATH AND CORONARY ANGIOGRAPHY;  Surgeon: HardiLeonie Man  [facility];  Service: Cardiovascular;  Laterality: N/A;   PILONIDAL CYST EXCISION     PILONIDAL CYST EXCISION  1983   TUMOR REMOVAL  2007/2008    SOCIAL HISTORY: Social History   Socioeconomic History   Marital status: Divorced    Spouse name: Not on file   Number of children: 2   Years of education: Not on file   Highest education level: Not on file  Occupational History   Not on file  Tobacco Use   Smoking status: Former    Packs/day: 1.00    Types: Cigarettes   Smokeless tobacco: Never  Vaping Use   Vaping Use: Never used  Substance and Sexual Activity   Alcohol use: Yes    Comment: Occasional   Drug use: Never   Sexual activity: Not on file  Other Topics Concern   Not on file  Social History Narrative  Right handed    Lives in one story home   Drinks Caffeine - Sweet Tea    Social Determinants of Health   Financial Resource Strain: Low Risk  (07/08/2022)   Overall Financial Resource Strain (CARDIA)    Difficulty of Paying Living Expenses: Not hard at all  Food Insecurity: No Food Insecurity (09/17/2022)   Hunger Vital Sign    Worried About Running Out of Food in the Last Year: Never true    Ran Out of Food in the Last Year: Never true  Transportation Needs: No Transportation Needs (09/17/2022)   PRAPARE - Hydrologist (Medical): No    Lack of Transportation (Non-Medical): No  Physical Activity: Inactive (07/08/2022)   Exercise Vital Sign    Days of Exercise per Week: 0 days    Minutes of Exercise per Session: 0 min  Stress: No Stress Concern Present (07/08/2022)   [place]    Feeling of Stress : Not at all  Social Connections: Socially Isolated (07/08/2022)   Social Connection and Isolation Panel [NHANES]    Frequency  of Communication with Friends and Family: Three times a week    Frequency of Social Gatherings with Friends and Family: Never    Attends Religious Services: Never    Marine scientist or Organizations: No    Attends Archivist Meetings: Never    Marital Status: Divorced  Human resources officer Violence: Not At Risk (09/17/2022)   Humiliation, Afraid, Rape, and Kick questionnaire    Fear of Current or Ex-Partner: No    Emotionally Abused: No    Physically Abused: No    Sexually Abused: No    FAMILY HISTORY: Family History  Problem Relation Age of Onset   Skin cancer Mother    Lung cancer Father    Uterine cancer Sister    Heart disease Brother    Bipolar disorder Sister    Throat cancer Paternal Grandmother     ALLERGIES:  is allergic to acthar hp [corticotropin], codeine, and prednisone.  MEDICATIONS:  Current Outpatient Medications  Medication Sig Dispense Refill   albuterol (VENTOLIN HFA) 108 (90 Base) MCG/ACT inhaler INHALE 1-2 PUFFS INTO THE LUNGS EVERY 6 (SIX) HOURS AS NEEDED FOR WHEEZING OR SHORTNESS OF BREATH. USE SPARINGLY! 18 each 0   amiodarone (PACERONE) 200 MG tablet Take 1 tablet (200 mg total) by mouth 2 (two) times daily. 180 tablet 3   aspirin EC 81 MG tablet Take 1 tablet (81 mg total) by mouth daily. Swallow whole. 30 tablet 0   atorvastatin (LIPITOR) 20 MG tablet Take 1 tablet (20 mg total) by mouth at bedtime. 30 tablet 0   cetirizine (ZYRTEC) 10 MG tablet Take 10 mg by mouth daily.     DULoxetine (CYMBALTA) 20 MG capsule Take 1 capsule (20 mg total) by mouth daily. For anxiety and pain. 90 capsule 0   feeding supplement, ENSURE ENLIVE, (ENSURE ENLIVE) LIQD Take 237 mLs by mouth 2 (two) times daily between meals.     fluticasone-salmeterol (ADVAIR DISKUS) 250-50 MCG/ACT AEPB INHALE 1 PUFF INTO THE LUNGS TWICE A DAY 60 each 5   guaiFENesin (MUCINEX) 600 MG 12 hr tablet Take 600 mg by mouth 2 (two) times daily as needed for cough.     metoprolol  succinate (TOPROL-XL) 25 MG 24 hr tablet Take 1 tablet (25 mg total) by mouth daily. Take with or immediately following a meal. 90 tablet 3  nitroGLYCERIN (NITROSTAT) 0.4 MG SL tablet Place 1 tablet (0.4 mg total) under the tongue every 5 (five) minutes as needed for chest pain. 20 tablet 5   omeprazole (PRILOSEC) 40 MG capsule Take 1 capsule (40 mg total) by mouth daily. 90 capsule 3   ondansetron (ZOFRAN) 4 MG tablet Take 1 tablet (4 mg total) by mouth every 8 (eight) hours as needed for nausea or vomiting. 20 tablet 0   Pediatric Multiple Vitamins (FLINTSTONES MULTIVITAMIN) CHEW Chew by mouth.     Probiotic Product (PROBIOTIC ADVANCED) CAPS Take 1 capsule by mouth daily.     torsemide (DEMADEX) 20 MG tablet Take for weight gain of 2 lbs in 1 day or 5 lbs in 1 week or obvious swelling. 30 tablet 11   trolamine salicylate (ASPERCREME) 10 % cream Apply 1 application. topically daily as needed for muscle pain (back or joint pain).     vitamin B-12 (CYANOCOBALAMIN) 500 MCG tablet Take 500 mcg by mouth 4 (four) times a week. 4 times weekly no more than 2000 MCG per week     ferrous sulfate 325 (65 FE) MG EC tablet Take 1 tablet (325 mg total) by mouth daily. 60 tablet 3   No current facility-administered medications for this visit.     PHYSICAL EXAMINATION: ECOG PERFORMANCE STATUS: 2 - Symptomatic, <50% confined to bed Vitals:   10/01/22 1424  BP: (!) 142/73  Pulse: 69  Resp: 18  Temp: 98 F (36.7 C)   Filed Weights   10/01/22 1424  Weight: 135 lb 1.6 oz (61.3 kg)    Physical Exam Constitutional:      General: She is not in acute distress.    Comments: Patient sits in the wheelchair  HENT:     Head: Normocephalic and atraumatic.  Eyes:     General: No scleral icterus. Cardiovascular:     Rate and Rhythm: Normal rate and regular rhythm.     Heart sounds: Murmur heard.  Pulmonary:     Effort: Pulmonary effort is normal. No respiratory distress.     Breath sounds: No wheezing.      Comments: Decreased breath sound bilaterally. Abdominal:     General: Bowel sounds are normal. There is no distension.     Palpations: Abdomen is soft.  Musculoskeletal:        General: No deformity. Normal range of motion.     Cervical back: Normal range of motion and neck supple.  Skin:    General: Skin is warm and dry.     Comments: Bilateral upper extremity ecchymosis.  Neurological:     Mental Status: She is alert and oriented to person, place, and time. Mental status is at baseline.  Psychiatric:        Mood and Affect: Mood normal.      LABORATORY DATA:  I have reviewed the data as listed    Latest Ref Rng & Units 09/29/2022   11:45 AM 09/19/2022    4:17 AM 09/16/2022    1:29 PM  CBC  WBC 4.0 - 10.5 K/uL 7.2  6.3  6.3   Hemoglobin 12.0 - 15.0 g/dL 11.4  11.2  10.6   Hematocrit 36.0 - 46.0 % 37.5  36.2  34.5   Platelets 150 - 400 K/uL 173  236  231       Latest Ref Rng & Units 09/19/2022    4:17 AM 09/18/2022    4:02 AM 09/17/2022    5:29 AM  CMP  Glucose 70 -  99 mg/dL 85  102  80   BUN 6 - 20 mg/dL 42  36  30   Creatinine 0.44 - 1.00 mg/dL 2.15  1.91  1.48   Sodium 135 - 145 mmol/L 134  138  137   Potassium 3.5 - 5.1 mmol/L 3.9  4.3  3.0   Chloride 98 - 111 mmol/L 104  107  107   CO2 22 - 32 mmol/L '20  22  22   '$ Calcium 8.9 - 10.3 mg/dL 9.0  8.8  7.9      Iron/TIBC/Ferritin/ %Sat    Component Value Date/Time   IRON 50 09/29/2022 1145   IRON 34 07/02/2020 1620   TIBC 315 09/29/2022 1145   TIBC 307 07/02/2020 1620   FERRITIN 117 09/29/2022 1145   FERRITIN 91 07/02/2020 1620   IRONPCTSAT 16 09/29/2022 1145   IRONPCTSAT 11 (L) 07/02/2020 1620

## 2022-10-01 NOTE — Assessment & Plan Note (Signed)
#  Anemia, combination of iron deficiency as well as chronic kidney disease. Labs reviewed and discussed with patient. Hemoglobin is stable.  Above 10.  No need for erythropoietin therapy. Iron panel is stable. Recommend patient to continue ferrous sulfate 325 mg daily.  Over-the-counter vitamin C supplementation.

## 2022-10-01 NOTE — Assessment & Plan Note (Addendum)
continue vitamin B12  2-3 times per week.

## 2022-10-02 DIAGNOSIS — Z720 Tobacco use: Secondary | ICD-10-CM | POA: Diagnosis not present

## 2022-10-02 DIAGNOSIS — I130 Hypertensive heart and chronic kidney disease with heart failure and stage 1 through stage 4 chronic kidney disease, or unspecified chronic kidney disease: Secondary | ICD-10-CM | POA: Diagnosis not present

## 2022-10-02 DIAGNOSIS — I2510 Atherosclerotic heart disease of native coronary artery without angina pectoris: Secondary | ICD-10-CM | POA: Diagnosis not present

## 2022-10-02 DIAGNOSIS — I272 Pulmonary hypertension, unspecified: Secondary | ICD-10-CM | POA: Diagnosis not present

## 2022-10-02 DIAGNOSIS — I5043 Acute on chronic combined systolic (congestive) and diastolic (congestive) heart failure: Secondary | ICD-10-CM | POA: Diagnosis not present

## 2022-10-02 DIAGNOSIS — I081 Rheumatic disorders of both mitral and tricuspid valves: Secondary | ICD-10-CM | POA: Diagnosis not present

## 2022-10-02 DIAGNOSIS — E876 Hypokalemia: Secondary | ICD-10-CM | POA: Diagnosis not present

## 2022-10-02 DIAGNOSIS — N179 Acute kidney failure, unspecified: Secondary | ICD-10-CM | POA: Diagnosis not present

## 2022-10-02 DIAGNOSIS — D509 Iron deficiency anemia, unspecified: Secondary | ICD-10-CM | POA: Diagnosis not present

## 2022-10-02 DIAGNOSIS — J449 Chronic obstructive pulmonary disease, unspecified: Secondary | ICD-10-CM | POA: Diagnosis not present

## 2022-10-02 DIAGNOSIS — N1831 Chronic kidney disease, stage 3a: Secondary | ICD-10-CM | POA: Diagnosis not present

## 2022-10-02 DIAGNOSIS — G35 Multiple sclerosis: Secondary | ICD-10-CM | POA: Diagnosis not present

## 2022-10-03 ENCOUNTER — Telehealth: Payer: Self-pay | Admitting: Primary Care

## 2022-10-03 DIAGNOSIS — I272 Pulmonary hypertension, unspecified: Secondary | ICD-10-CM | POA: Diagnosis not present

## 2022-10-03 DIAGNOSIS — J449 Chronic obstructive pulmonary disease, unspecified: Secondary | ICD-10-CM | POA: Diagnosis not present

## 2022-10-03 DIAGNOSIS — N1831 Chronic kidney disease, stage 3a: Secondary | ICD-10-CM | POA: Diagnosis not present

## 2022-10-03 DIAGNOSIS — N179 Acute kidney failure, unspecified: Secondary | ICD-10-CM | POA: Diagnosis not present

## 2022-10-03 DIAGNOSIS — I5043 Acute on chronic combined systolic (congestive) and diastolic (congestive) heart failure: Secondary | ICD-10-CM | POA: Diagnosis not present

## 2022-10-03 DIAGNOSIS — I130 Hypertensive heart and chronic kidney disease with heart failure and stage 1 through stage 4 chronic kidney disease, or unspecified chronic kidney disease: Secondary | ICD-10-CM | POA: Diagnosis not present

## 2022-10-03 NOTE — Telephone Encounter (Signed)
Anita [name]  PT from [facility] called in to make PCP aware of pt weight 128 with in a week its now 132

## 2022-10-06 NOTE — Telephone Encounter (Signed)
Noted. Please have PT notify cardiologist.  We will see her in the office tomorrow (10/31) as scheduled.

## 2022-10-07 ENCOUNTER — Ambulatory Visit (INDEPENDENT_AMBULATORY_CARE_PROVIDER_SITE_OTHER): Payer: Medicare Other | Admitting: Primary Care

## 2022-10-07 ENCOUNTER — Encounter: Payer: Self-pay | Admitting: Primary Care

## 2022-10-07 VITALS — BP 118/76 | HR 64 | Temp 97.3°F | Ht 63.0 in | Wt 132.0 lb

## 2022-10-07 DIAGNOSIS — I471 Supraventricular tachycardia, unspecified: Secondary | ICD-10-CM

## 2022-10-07 DIAGNOSIS — N1832 Chronic kidney disease, stage 3b: Secondary | ICD-10-CM | POA: Diagnosis not present

## 2022-10-07 DIAGNOSIS — I5020 Unspecified systolic (congestive) heart failure: Secondary | ICD-10-CM | POA: Diagnosis not present

## 2022-10-07 LAB — BASIC METABOLIC PANEL
BUN: 35 mg/dL — ABNORMAL HIGH (ref 6–23)
CO2: 24 mEq/L (ref 19–32)
Calcium: 8.4 mg/dL (ref 8.4–10.5)
Chloride: 104 mEq/L (ref 96–112)
Creatinine, Ser: 1.83 mg/dL — ABNORMAL HIGH (ref 0.40–1.20)
GFR: 29.83 mL/min — ABNORMAL LOW (ref >60.00)
Glucose, Bld: 87 mg/dL (ref 70–99)
Potassium: 4 mEq/L (ref 3.5–5.1)
Sodium: 141 mEq/L (ref 135–145)

## 2022-10-07 NOTE — Assessment & Plan Note (Signed)
Continue torsemide 20 mg daily as needed. Repeat BMP pending.  Following with heart failure clinic.

## 2022-10-07 NOTE — Patient Instructions (Signed)
Stop by the lab prior to leaving today. I will notify you of your results once received.   Continue amiodarone 200 mg twice daily for heart rate.   Continue to monitor your weight and heart rates.  It was a pleasure to see you today!

## 2022-10-07 NOTE — Telephone Encounter (Signed)
Called and spoke with patients daughter, she states they are following with cardiology and they have her checking patients weight daily and updating them with that.

## 2022-10-07 NOTE — Assessment & Plan Note (Signed)
Recent hospitalization. NSR today. Appears stable.   Reviewed hospital notes, imaging, and labs.  Repeat BMP pending today.   Continue amiodarone 200 mg twice daily, metoprolol succinate 25 mg daily. Follow-up with cardiology as scheduled.

## 2022-10-07 NOTE — Telephone Encounter (Signed)
Left message with Ruby Cola to return call to office.

## 2022-10-07 NOTE — Progress Notes (Signed)
Subjective:    Patient ID: Anita [name], female    DOB: Sep 11, 1961, 61 y.o.   MRN: [id number]  HPI  Anita [name] is a very pleasant 61 y.o. female with a history of hypertension, CHF, pulmonary hypertension, ischemic cardiomyopathy, mitral valve insufficiency, paroxysmal SVT, COPD, cirrhosis of the liver, multiple sclerosis, generalized weakness who presents today for TCM hospital follow-up. Her daughter joins [place] today  She originally presented to our office on 09/16/2022 for hospital follow-up from September 2023.  During this office visit she was noted to be in SVT with heart rate ranging from 150s to 170s bpm.  Paramedics were called and she was taken to [facility] ED for evaluation.  EMS provided her with 6 mg of adenosine followed by an additional 12 mg of adenosine with cardioversion to normal sinus rhythm.  Work-up in the ED revealed BNP greater than 4000, hypokalemia, sinus tachycardia, moderate bilateral pleural effusions and pulmonary edema.  She was admitted for further evaluation.  During her hospital stay she was initiated on metoprolol tartrate 12.5 mg twice daily which was further increased to 25 mg twice daily per cardiology.  Unfortunately, she experienced another episode of SVT for which adenosine was provided with subsequent amiodarone IV bolus with drip.  She was transition to amiodarone 400 mg twice daily with plans for reduction to 200 mg twice daily.  Torsemide was resumed 20 mg daily as needed.  She was changed back to metoprolol succinate 25 mg daily.  She was discharged home on 09/19/2022 with recommendations for EP outpatient follow-up, PCP follow-up.  Since her discharge home she has seen her hematologist.  No changes made to regimen, ferrous sulfate 325 mg daily was continued and vitamin B12 3 times weekly was continued.  Today she has been taking her amiodarone 200 mg BID, metoprolol succinate 25 mg daily. She is monitoring her HR which is running in the  low 90's. She's feeling tired, but overall better. She is not weighing daily, does notice fluctuations in her abdominal swelling. Last dose of torsemide was yesterday. She has spoken with her cardiologist who recommends to continue with amiodarone for now, then set up EP visit if this is ineffective. She is needing her handicap placard renewed.    Review of Systems  Respiratory:         Exertional shortness of breath  Cardiovascular:  Negative for palpitations.  Neurological:  Negative for headaches.  Psychiatric/Behavioral:  The patient is not nervous/anxious.          Past Medical History:  Diagnosis Date   Abnormality of gait 07/26/2013   Acute respiratory failure with hypoxia ([place]) 03/20/2020   Allergy    Arthritis    Atypical pneumonia 03/22/2020   CAD (coronary artery disease)    a. 03/2020 Cath: LM nl, LAD 60/40m 55d, D1 70, D2 70, LCX 45ost/p, 65p, RCA 100ost CTO. RPDA fills via collats from dLAD. Inf septal fills via collats from 1st septal, RPAV fills via collats from LPAV, RPL1/2 sev dzs-->med rx.   Chickenpox    Chronic combined systolic (congestive) and diastolic (congestive) heart failure ([place]    a. 02/2021 Echo: EF 40-45%, glob HK, gr2 DD. Sev red RV fxn, RVSP 69.249mg. Mod dil LA, sev dil RA. Sev MR. Mild to mod MS. Mean MV grad 6.56m60m. Mod-Sev TR. Mild AI. Mild Ao sclerosis w/o stenosis.   COPD (chronic obstructive pulmonary disease) (HCC)    Elevated troponin 03/22/2020   GERD (gastroesophageal reflux disease)  Headache(784.0)    Migraine   Hearing loss    Right ear secondary to infection   History of shingles    Ischemic cardiomyopathy    a. 02/2021 Echo: EF 40-45%, glob HK.   Migraines    Multiple sclerosis (HCC)    Obese    Optic neuritis    PAH (pulmonary artery hypertension) ([place])    a. 02/2021 Echo: RVSP 69.82mHg.   Prolonged QT interval 03/20/2020   Severe mitral regurgitation    a. 02/2021 Echo: Severe MR w/ mild to mod MS.  Mean MV grad 6.529mg.     Social History   Socioeconomic History   Marital status: Divorced    Spouse name: Not on file   Number of children: 2   Years of education: Not on file   Highest education level: Not on file  Occupational History   Not on file  Tobacco Use   Smoking status: Former    Packs/day: 1.00    Types: Cigarettes   Smokeless tobacco: Never  Vaping Use   Vaping Use: Never used  Substance and Sexual Activity   Alcohol use: Yes    Comment: Occasional   Drug use: Never   Sexual activity: Not on file  Other Topics Concern   Not on file  Social History Narrative   Right handed    Lives in one story home   Drinks Caffeine - Sweet Tea    Social Determinants of Health   Financial Resource Strain: Low Risk  (07/08/2022)   Overall Financial Resource Strain (CARDIA)    Difficulty of Paying Living Expenses: Not hard at all  Food Insecurity: No Food Insecurity (09/17/2022)   Hunger Vital Sign    Worried About Running Out of Food in the Last Year: Never true    RaParisn the Last Year: Never true  Transportation Needs: No Transportation Needs (09/17/2022)   PRAPARE - TrHydrologistMedical): No    Lack of Transportation (Non-Medical): No  Physical Activity: Inactive (07/08/2022)   Exercise Vital Sign    Days of Exercise per Week: 0 days    Minutes of Exercise per Session: 0 min  Stress: No Stress Concern Present (07/08/2022)   Fi[place]  Feeling of Stress : Not at all  Social Connections: Socially Isolated (07/08/2022)   Social Connection and Isolation Panel [NHANES]    Frequency of Communication with Friends and Family: Three times a week    Frequency of Social Gatherings with Friends and Family: Never    Attends Religious Services: Never    AcMarine scientistr Organizations: No    Attends ClArchivisteetings: Never    Marital Status: Divorced  InHuman resources officerViolence: Not At Risk (09/17/2022)   Humiliation, Afraid, Rape, and Kick questionnaire    Fear of Current or Ex-Partner: No    Emotionally Abused: No    Physically Abused: No    Sexually Abused: No    Past Surgical History:  Procedure Laterality Date   COLONOSCOPY WITH PROPOFOL N/A 08/03/2020   Procedure: COLONOSCOPY WITH PROPOFOL;  Surgeon: AnJonathon BellowsMD;  [facility];  Service: Gastroenterology;  Laterality: N/A;   Ganglioneuroma     Resection   LEFT HEART CATH AND CORONARY ANGIOGRAPHY N/A 03/23/2020   Procedure: LEFT HEART CATH AND CORONARY ANGIOGRAPHY;  Surgeon: HaLeonie ManMD;  [facility];  Service:  Cardiovascular;  Laterality: N/A;   PILONIDAL CYST EXCISION     PILONIDAL CYST EXCISION  1983   TUMOR REMOVAL  2007/2008    Family History  Problem Relation Age of Onset   Skin cancer Mother    Lung cancer Father    Uterine cancer Sister    Heart disease Brother    Bipolar disorder Sister    Throat cancer Paternal Grandmother     Allergies  Allergen Reactions   Acthar Hp [Corticotropin] Other (See Comments)    Throat swelling and coughing up blood   Codeine    Prednisone     Current Outpatient Medications on File Prior to Visit  Medication Sig Dispense Refill   albuterol (VENTOLIN HFA) 108 (90 Base) MCG/ACT inhaler INHALE 1-2 PUFFS INTO THE LUNGS EVERY 6 (SIX) HOURS AS NEEDED FOR WHEEZING OR SHORTNESS OF BREATH. USE SPARINGLY! 18 each 0   amiodarone (PACERONE) 200 MG tablet Take 1 tablet (200 mg total) by mouth 2 (two) times daily. 180 tablet 3   aspirin EC 81 MG tablet Take 1 tablet (81 mg total) by mouth daily. Swallow whole. 30 tablet 0   atorvastatin (LIPITOR) 20 MG tablet Take 1 tablet (20 mg total) by mouth at bedtime. 30 tablet 0   cetirizine (ZYRTEC) 10 MG tablet Take 10 mg by mouth daily.     DULoxetine (CYMBALTA) 20 MG capsule Take 1 capsule (20 mg total) by mouth daily. For anxiety and pain. 90 capsule 0   feeding  supplement, ENSURE ENLIVE, (ENSURE ENLIVE) LIQD Take 237 mLs by mouth 2 (two) times daily between meals.     ferrous sulfate 325 (65 FE) MG EC tablet Take 1 tablet (325 mg total) by mouth daily. 60 tablet 3   fluticasone-salmeterol (ADVAIR DISKUS) 250-50 MCG/ACT AEPB INHALE 1 PUFF INTO THE LUNGS TWICE A DAY 60 each 5   guaiFENesin (MUCINEX) 600 MG 12 hr tablet Take 600 mg by mouth 2 (two) times daily as needed for cough.     metoprolol succinate (TOPROL-XL) 25 MG 24 hr tablet Take 1 tablet (25 mg total) by mouth daily. Take with or immediately following a meal. 90 tablet 3   nitroGLYCERIN (NITROSTAT) 0.4 MG SL tablet Place 1 tablet (0.4 mg total) under the tongue every 5 (five) minutes as needed for chest pain. 20 tablet 5   omeprazole (PRILOSEC) 40 MG capsule Take 1 capsule (40 mg total) by mouth daily. 90 capsule 3   Pediatric Multiple Vitamins (FLINTSTONES MULTIVITAMIN) CHEW Chew by mouth.     Probiotic Product (PROBIOTIC ADVANCED) CAPS Take 1 capsule by mouth daily.     torsemide (DEMADEX) 20 MG tablet Take for weight gain of 2 lbs in 1 day or 5 lbs in 1 week or obvious swelling. 30 tablet 11   trolamine salicylate (ASPERCREME) 10 % cream Apply 1 application. topically daily as needed for muscle pain (back or joint pain).     vitamin B-12 (CYANOCOBALAMIN) 500 MCG tablet Take 500 mcg by mouth 4 (four) times a week. 4 times weekly no more than 2000 MCG per week     ondansetron (ZOFRAN) 4 MG tablet Take 1 tablet (4 mg total) by mouth every 8 (eight) hours as needed for nausea or vomiting. (Patient not taking: Reported on 10/07/2022) 20 tablet 0   No current facility-administered medications on file prior to visit.    BP 118/76   Pulse 64   Temp (!) 97.3 F (36.3 C) (Temporal)   Ht '5\' 3"'$  (1.6  m)   Wt 132 lb (59.9 kg)   SpO2 99%   BMI 23.38 kg/m  Objective:   Physical Exam Cardiovascular:     Rate and Rhythm: Normal rate and regular rhythm.  Pulmonary:     Effort: Pulmonary effort is  normal.     Breath sounds: Normal breath sounds.  Musculoskeletal:     Cervical back: Neck supple.  Skin:    General: Skin is warm and dry.           Assessment & Plan:   Problem List Items Addressed This Visit       Cardiovascular and Mediastinum   Paroxysmal SVT (supraventricular tachycardia)    Recent hospitalization. NSR today. Appears stable.   Reviewed hospital notes, imaging, and labs.  Repeat BMP pending today.   Continue amiodarone 200 mg twice daily, metoprolol succinate 25 mg daily. Follow-up with cardiology as scheduled.      HFrEF (heart failure with reduced ejection fraction) (HCC)    Continue torsemide 20 mg daily as needed. Repeat BMP pending.  Following with heart failure clinic.        Genitourinary   Stage 3b chronic kidney disease ([place]) - Primary   Relevant Orders   Basic metabolic panel       [name], NP

## 2022-10-08 ENCOUNTER — Telehealth (HOSPITAL_COMMUNITY): Payer: Self-pay

## 2022-10-08 ENCOUNTER — Telehealth: Payer: Self-pay | Admitting: Primary Care

## 2022-10-08 DIAGNOSIS — N179 Acute kidney failure, unspecified: Secondary | ICD-10-CM | POA: Diagnosis not present

## 2022-10-08 DIAGNOSIS — I5043 Acute on chronic combined systolic (congestive) and diastolic (congestive) heart failure: Secondary | ICD-10-CM | POA: Diagnosis not present

## 2022-10-08 DIAGNOSIS — J449 Chronic obstructive pulmonary disease, unspecified: Secondary | ICD-10-CM | POA: Diagnosis not present

## 2022-10-08 DIAGNOSIS — N1831 Chronic kidney disease, stage 3a: Secondary | ICD-10-CM | POA: Diagnosis not present

## 2022-10-08 DIAGNOSIS — I272 Pulmonary hypertension, unspecified: Secondary | ICD-10-CM | POA: Diagnosis not present

## 2022-10-08 DIAGNOSIS — I130 Hypertensive heart and chronic kidney disease with heart failure and stage 1 through stage 4 chronic kidney disease, or unspecified chronic kidney disease: Secondary | ICD-10-CM | POA: Diagnosis not present

## 2022-10-08 NOTE — Telephone Encounter (Signed)
Attempted to contact to set up a home visit.  Left message.   [place] [phone]

## 2022-10-08 NOTE — Telephone Encounter (Signed)
Ivin Booty states they saw patient this morning and she had a very distended and firm stomach. She wanted to make [place] aware that patient had gained 10 pounds over the last week. I notified Ivin Booty that we are aware, we saw patient for an office visit yesterday. She states that a nurse from [facility] will now come for a visit every week to check on patient. Informed that patient is followed by cardiology. She also wanted to verify there were no medication changes at the visit yesterday. I confirmed there were no changes in medication and that patients daughter keeps a check on her weight and updates cardiology with that.

## 2022-10-08 NOTE — Telephone Encounter (Signed)
Called patients daughter she states the cardiologist and heart failure clinic is following her weight and medications very closely. She is to take torsemide '40mg'$  once daily for three days if she has a weight change of 2 pounds over night or 5 pounds in a week per cardiologist.   Weight reading for 11/01 was 131.6. Patients daughter states it has been staying 130-132 in the past few days.  I notified her of what the nurse from [facility] reported, that she had gained 10 pounds in a week and patients daughter does not feel that is accurate. She didn't have her readings in front of her but she keeps a very close watch on it and notifies cardiology of major changes.

## 2022-10-08 NOTE — Telephone Encounter (Signed)
Rory Percy RN from [facility] called on patient Anita [name] want to talk about some findings. Call back number  [phone].

## 2022-10-08 NOTE — Telephone Encounter (Signed)
Noted. Weights are stable, continue with current regimen per heart failure clinic.

## 2022-10-08 NOTE — Telephone Encounter (Signed)
Wt Readings from Last 3 Encounters:  10/07/22 132 lb (59.9 kg)  10/01/22 135 lb 1.6 oz (61.3 kg)  09/22/22 126 lb (57.2 kg)   Her weight has decreased compared to last visit. She is still about 6 pounds over baseline weight.   Call patient's daughter, is she back to taking her torsemide 20 mg PRN? When was her last dose and how much did she take?   Also, what was her weight at home on 11/01 and 11/02?

## 2022-10-09 DIAGNOSIS — I130 Hypertensive heart and chronic kidney disease with heart failure and stage 1 through stage 4 chronic kidney disease, or unspecified chronic kidney disease: Secondary | ICD-10-CM | POA: Diagnosis not present

## 2022-10-09 DIAGNOSIS — N179 Acute kidney failure, unspecified: Secondary | ICD-10-CM | POA: Diagnosis not present

## 2022-10-09 DIAGNOSIS — J449 Chronic obstructive pulmonary disease, unspecified: Secondary | ICD-10-CM | POA: Diagnosis not present

## 2022-10-09 DIAGNOSIS — I5043 Acute on chronic combined systolic (congestive) and diastolic (congestive) heart failure: Secondary | ICD-10-CM | POA: Diagnosis not present

## 2022-10-09 DIAGNOSIS — I272 Pulmonary hypertension, unspecified: Secondary | ICD-10-CM | POA: Diagnosis not present

## 2022-10-09 DIAGNOSIS — N1831 Chronic kidney disease, stage 3a: Secondary | ICD-10-CM | POA: Diagnosis not present

## 2022-10-13 ENCOUNTER — Encounter (HOSPITAL_COMMUNITY): Payer: Self-pay

## 2022-10-13 ENCOUNTER — Other Ambulatory Visit (HOSPITAL_COMMUNITY): Payer: Self-pay

## 2022-10-13 NOTE — Progress Notes (Signed)
Today made a home visit with Anita [name] and her daughter.  Explaind my program to her and they want me to visit.  She has had a 4 lbs weight gain since last Wednesday.  She had stopped weighing in the middle so not sure when the weight gain started and of what period.  Her belly is tight and they state that is not her normal.  No edema in lower extremities.  Lungs are clear.  She states she started having more nausea in past 2 days, advised her most likely due to weight gain and abdomen swollen.  She agreed.  Her daughter states they have a standing order for torsemide 40 mg for 3 days with weight gain.  She is starting that today.  Will call to get weight in the morning.  Discussed importance of not slacking and weighing daily even if feeling good.  She has PT coming during the week.  We talked about diet with low sodium and drinking less than 64 ounces a day.  She been staying in her bedroom, advised her she needs to get out of the bedroom and spend time in the living room for a change.  She states does feel better when PT takes her to the living room.  We set goes to start out maybe an hour a day for a couple and to increased to 5 hours a day.  Her daughter will be having surgery, which is her main caregiver, she will have other relatives in her home to care for her.  Anita [name] says she will want to spend time in the living room with them.  Her daughter does her med boxes and she states she keeps up with refills and aware of how to give them to her.  Advised her any problems I can assist.  She denies any palpatations or chest pain.  Heart rate is steady today with rates of 66.  She is compliant with meds.  Will continue to visit for heart failure, diet and medication compliance.  Anita [name] and her daughter has my contact information and aware to call with problems.  They are aware of up coming problems.  [place] [phone]

## 2022-10-15 ENCOUNTER — Telehealth (HOSPITAL_COMMUNITY): Payer: Self-pay

## 2022-10-15 DIAGNOSIS — I272 Pulmonary hypertension, unspecified: Secondary | ICD-10-CM | POA: Diagnosis not present

## 2022-10-15 DIAGNOSIS — I130 Hypertensive heart and chronic kidney disease with heart failure and stage 1 through stage 4 chronic kidney disease, or unspecified chronic kidney disease: Secondary | ICD-10-CM | POA: Diagnosis not present

## 2022-10-15 DIAGNOSIS — I5043 Acute on chronic combined systolic (congestive) and diastolic (congestive) heart failure: Secondary | ICD-10-CM | POA: Diagnosis not present

## 2022-10-15 DIAGNOSIS — J449 Chronic obstructive pulmonary disease, unspecified: Secondary | ICD-10-CM | POA: Diagnosis not present

## 2022-10-15 DIAGNOSIS — N179 Acute kidney failure, unspecified: Secondary | ICD-10-CM | POA: Diagnosis not present

## 2022-10-15 DIAGNOSIS — N1831 Chronic kidney disease, stage 3a: Secondary | ICD-10-CM | POA: Diagnosis not present

## 2022-10-15 NOTE — Telephone Encounter (Signed)
Today contacted Pat's daughter, she advised she has lost a couple of lbs and feeling better.  Her belly is coming down.  She is taking another dose of torsemide today and will call tomorrow to make sure the rest is came off.  She denies increased dizziness or unable to urinate.  Will continue to visit for heart failure, diet and medication management.   [place] [phone]

## 2022-10-16 ENCOUNTER — Encounter (HOSPITAL_COMMUNITY): Payer: Self-pay

## 2022-10-16 ENCOUNTER — Other Ambulatory Visit (HOSPITAL_COMMUNITY): Payer: Self-pay

## 2022-10-16 ENCOUNTER — Telehealth (HOSPITAL_COMMUNITY): Payer: Self-pay

## 2022-10-16 ENCOUNTER — Other Ambulatory Visit: Payer: Self-pay | Admitting: Family

## 2022-10-16 MED ORDER — POTASSIUM CHLORIDE CRYS ER 20 MEQ PO TBCR: EXTENDED_RELEASE_TABLET | ORAL | 3 refills | Status: DC

## 2022-10-16 NOTE — Progress Notes (Signed)
Today contacted Pat's daughter to see how her weight is and how she was feeling.  She states weight is back up a lb, her abdomen is hard again and she is nauseated and vomiting all night.  Contacted Tina with HF clinic and I went out for a visit.  After checking her vitals and checking her out Otila Kluver gave order to give 40 mg Lasix.  Gave 40 mg lasix IV R AC, 22 gauge no infiltration, flushed with 10 cc saline.   Bleeding controlled with pressure and bandaid placed.  Otila Kluver also called in potassium at her pharmacy and advised her daughter how she needs to take them.  Otila Kluver also made her an appt with her tomorrow and to get labs.  Her daughter states she will take her on her lunch.Advised her for the nausea she could take her zofran, she did while I was there.  Lungs has a little rhonchi in lower left.  No edema in lower legs and abdomen very tight.  She states drank tea yesterday about 60 ounces and ate a steak and potato from steak house.  Her daughter stated was not loaded with salt.  She is drinking sprite today and she is keeping it under 60 ounces, counting all her fluids.  She is eats a english muffin in the morning for breakfast and she will be eating a salad for lunch today.  She denies any chest pain, increased shortness of breath, headaches or dizziness.  Heart rate is normal and regular.  Will continue to visit for heart failure, diet and medication management.   [place] [phone]

## 2022-10-16 NOTE — Telephone Encounter (Signed)
Today contacted Pats daughter and she states belly is tight again, nausea/vomiting has came back and weight is up a couple of lbs to 134.3 lbs.  Anita [name] with HF clinic a message to advise for next step.  They have done torsemide for 3 days.   [place] [phone]

## 2022-10-16 NOTE — Progress Notes (Signed)
Patient ID: Anita [name], female    DOB: March 01, 1961, 61 y.o.   MRN: [id number]  HPI  Anita [name] is a 61 y/o female with a history of CAD, COPD, GERD, multiple sclerosis, pulmonary HTN, severe valvular disease, previous tobacco use and chronic heart failure.   Echo report from 08/22/22 reviewed and showed an EF of 30-35% along with mild LVH and severe MR/TR. Echo report from 02/07/21 reviewed and showed an EF of 40-45% along with severely elevated PA pressure of 69.2 mmHg, moderate LAE, severe MR, mild/ moderate Anita and moderate/severe TR.   LHC done 03/23/20 showed: Hemodynamics: LV end diastolic pressure is moderately elevated. ----Coronary Angiography----- Ost RCA to Dist RCA lesion is 100% stenosed.-The PDA and PL system fills via faint collaterals from the AV groove LCx and LAD septals. Mid LAD-1 lesion is 60% stenosed. Mid LAD-2 lesion is 50% stenosed. Dist LAD lesion is 55% stenosed with 70% stenosed side branch in 2nd Diag. 1st Diag lesion is 70% stenosed. Ost Cx to Prox Cx lesion is 45% stenosed. Prox-MID Cx lesion is 65% stenosed.   MODERATE-SEVERE THREE-VESSEL CAD: 100% proximal RCA occlusion with left-to-right collaterals faintly filling PDA and PL system (AV groove LCx-PL and LAD septal-PDA) Diffuse moderate mid LAD disease with 60% to 50% stenosis. Tandem 50% and 65% proximal and mid LCx with the 65% lesion being the most significant lesion. Moderately elevated LVEDP of 18-20 mmHg  Admitted 09/16/22 due to SVT. Cardiology consult obtained. Given IV adenosine and then amiodarone drip. Plan for EP consult. Discharged after 3 days.   Admitted 08/21/22 due to increased facial and abdominal swelling for the past few weeks after not taking home diuretics for a few days. Given a dose of IV lasix but then placed on lasix drip. Cardiology and nephrology consults obtained. Elevated troponin thought to be due to demand ischemia. PT/OT evaluations done. Lasix drip stopped and  transitioned to oral diuretics. Discharged after 11 days.     She presents today for an acute visit with a chief complaint of moderate shortness of breath with minimal exertion. Describes this as chronic in nature having been present for several years although it has worsened over several days. She has associated fatigue, light-headedness, abdominal distention, weight gain and back pain along with this. She denies any difficulty sleeping, palpitations, pedal edema, chest pain or cough.   Took '40mg'$  torsemide daily for 3 days in a row earlier this week and then got '40mg'$  IV lasix/ 46mq PO potassium yesterday via paramedic program. Weight did decline at home by 2 pounds overnight.   Did eat some chicken alfredo last night but normally is closely watching her sodium intake.   Past Medical History:  Diagnosis Date   Abnormality of gait 07/26/2013   [place] 03/20[place]   Allergy    Arthritis    Atypical pneumonia 03/22/2020   CAD (coronary artery disease)    a. 03/2020 Cath: LM nl, LAD 60/566m55d, D1 70, D2 70, LCX 45ost/p, 65p, RCA 100ost CTO. RPDA fills via collats from dLAD. Inf septal fills via collats from 1st septal, RPAV fills via collats from LPAV, RPL1/2 sev dzs-->med rx.   Chickenpox    Chronic combined systolic (congestive) and diastolic [place]congestive) [place]   a. 02/2021 Echo: EF 40-45%, glob HK, gr2 DD. Sev red RV fxn, RVSP 69.46m24m. Mod dil LA, sev dil RA. Sev MR. Mild to mod Anita. Mean MV grad 6.5mm45m Mod-Sev TR. Mild AI. Mild  Ao sclerosis w/o stenosis.   COPD (chronic obstructive pulmonary disease) (HCC)    Elevated troponin 03/22/2020   GERD (gastroesophageal reflux disease)    Headache(784.0)    Migraine   Hearing loss    Right ear secondary to infection   History of shingles    Ischemic cardiomyopathy    a. 02/2021 Echo: EF 40-45%, glob HK.   Migraines    Multiple sclerosis (HCC)    Obese    Optic neuritis    PAH (pulmonary  artery hypertension) ([place])    a. 02/2021 Echo: RVSP 69.61mHg.   Prolonged QT interval 03/20/2020   Severe mitral regurgitation    a. 02/2021 Echo: Severe MR w/ mild to mod Anita.  Mean MV grad 6.583mg.   Past Surgical History:  Procedure Laterality Date   COLONOSCOPY WITH PROPOFOL N/A 08/03/2020   Procedure: COLONOSCOPY WITH PROPOFOL;  Surgeon: AnJonathon BellowsMD;  [facility];  Service: Gastroenterology;  Laterality: N/A;   Ganglioneuroma     Resection   LEFT HEART CATH AND CORONARY ANGIOGRAPHY N/A 03/23/2020   Procedure: LEFT HEART CATH AND CORONARY ANGIOGRAPHY;  Surgeon: HaLeonie ManMD;  [facility];  Service: Cardiovascular;  Laterality: N/A;   PILONIDAL CYST EXCISION     PILONIDAL CYST EXCISION  1983   TUMOR REMOVAL  2007/2008   Family History  Problem Relation Age of Onset   Skin cancer Mother    Lung cancer Father    Uterine cancer Sister    Heart disease Brother    Bipolar disorder Sister    Throat cancer Paternal Grandmother    Social History   Tobacco Use   Smoking status: Former    Packs/day: 1.00    Types: Cigarettes   Smokeless tobacco: Never  Substance Use Topics   Alcohol use: Yes    Comment: Occasional   Allergies  Allergen Reactions   Acthar Hp [Corticotropin] Other (See Comments)    Throat swelling and coughing up blood   Codeine    Prednisone    Prior to Admission medications   Medication Sig Start Date End Date Taking? Authorizing Provider  albuterol (VENTOLIN HFA) 108 (90 Base) MCG/ACT inhaler INHALE 1-2 PUFFS INTO THE LUNGS EVERY 6 (SIX) HOURS AS NEEDED FOR WHEEZING OR SHORTNESS OF BREATH. USE SPARINGLY! 11/13/21  Yes ClPleas KochNP  amiodarone (PACERONE) 200 MG tablet Take 1 tablet (200 mg total) by mouth 2 (two) times daily. 09/22/22  Yes WaLoel DubonnetNP  aspirin EC 81 MG tablet Take 1 tablet (81 mg total) by mouth daily. Swallow whole. 09/20/22  Yes AmNolberto HanlonMD  atorvastatin (LIPITOR) 20 MG tablet  Take 1 tablet (20 mg total) by mouth at bedtime. 09/19/22 10/19/22 Yes AmNolberto HanlonMD  DULoxetine (CYMBALTA) 20 MG capsule Take 1 capsule (20 mg total) by mouth daily. For anxiety and pain. 09/16/22  Yes ClPleas KochNP  ferrous sulfate 325 (65 FE) MG EC tablet Take 1 tablet (325 mg total) by mouth daily. 10/01/22  Yes YuEarlie ServerMD  fluticasone-salmeterol (ADVAIR DISKUS) 250-50 MCG/ACT AEPB INHALE 1 PUFF INTO THE LUNGS TWICE A DAY 12/02/21  Yes ClPleas KochNP  metoprolol succinate (TOPROL-XL) 25 MG 24 hr tablet Take 1 tablet (25 mg total) by mouth daily. Take with or immediately following a meal. 01/01/22 12/27/22 Yes ChBuford DresserMD  nitroGLYCERIN (NITROSTAT) 0.4 MG SL tablet Place 1 tablet (0.4 mg total) under the tongue every 5 (five) minutes as needed  for chest pain. 08/02/21  Yes [name] A, FNP  omeprazole (PRILOSEC) 40 MG capsule Take 1 capsule (40 mg total) by mouth daily. 03/04/21  Yes [name], MD  Pediatric Multiple Vitamins (FLINTSTONES MULTIVITAMIN) CHEW Chew by mouth.   Yes [provider]  potassium chloride SA (KLOR-CON M) 20 MEQ tablet Take 2 potassium tablets today and then take 1 potassium for every torsemide you take 10/16/22  Yes [name] A, FNP  Probiotic Product (PROBIOTIC ADVANCED) CAPS Take 1 capsule by mouth daily.   Yes [provider]  torsemide (DEMADEX) 20 MG tablet Take for weight gain of 2 lbs in 1 day or 5 lbs in 1 week or obvious swelling. 01/01/22  Yes Buford Dresser, MD  trolamine salicylate (ASPERCREME) 10 % cream Apply 1 application. topically daily as needed for muscle pain (back or joint pain).   Yes [provider]  vitamin B-12 (CYANOCOBALAMIN) 500 MCG tablet Take 500 mcg by mouth 4 (four) times a week. 4 times weekly no more than 2000 MCG per week   Yes [provider]  feeding supplement, ENSURE ENLIVE, (ENSURE ENLIVE) LIQD Take 237 mLs by mouth 2 (two) times daily between  meals. Patient not taking: Reported on 10/16/2022 03/28/20   Barton Dubois, MD   Review of Systems  Constitutional:  Positive for fatigue. Negative for appetite change.  HENT:  Negative for congestion, postnasal drip and sore throat.   Eyes: Negative.   Respiratory:  Positive for shortness of breath. Negative for cough and chest tightness.   Cardiovascular:  Negative for chest pain, palpitations and leg swelling.  Gastrointestinal:  Positive for abdominal distention. Negative for abdominal pain.  Endocrine: Negative.   Genitourinary: Negative.   Musculoskeletal:  Positive for back pain. Negative for neck pain.  Skin: Negative.   Allergic/Immunologic: Negative.   Neurological:  Positive for light-headedness ("all the time"). Negative for dizziness.  Hematological:  Negative for adenopathy. Does not bruise/bleed easily.  Psychiatric/Behavioral:  Negative for dysphoric mood and sleep disturbance (sleeping on 2-3 pillows).    Vitals:   10/17/22 1149  BP: 126/76  Resp: 16  Weight: 135 lb 6 oz (61.4 kg)  Height: '5\' 3"'$  (1.6 m)   Wt Readings from Last 3 Encounters:  10/17/22 135 lb 6 oz (61.4 kg)  10/16/22 134 lb 4.8 oz (60.9 kg)  10/13/22 135 lb 9.6 oz (61.5 kg)   Lab Results  Component Value Date   CREATININE 1.83 (H) 10/07/2022   CREATININE 2.15 (H) 09/19/2022   CREATININE 1.91 (H) 09/18/2022   Physical Exam Vitals and nursing note reviewed. Exam conducted with a chaperone present (daughter).  Constitutional:      Appearance: Normal appearance.  HENT:     Head: Normocephalic and atraumatic.  Cardiovascular:     Rate and Rhythm: Normal rate and regular rhythm.     Heart sounds: Murmur heard.  Pulmonary:     Effort: Pulmonary effort is normal. No respiratory distress.     Breath sounds: No wheezing or rales.  Abdominal:     General: There is distension.     Tenderness: There is no abdominal tenderness.  Musculoskeletal:        General: No tenderness.     Right lower leg:  No edema.     Left lower leg: No edema.  Skin:    General: Skin is warm and dry.  Neurological:     General: No focal deficit present.     Mental Status: She is alert  and oriented to person, place, and time. Mental status is at baseline.  Psychiatric:        Mood and Affect: Mood normal.        Behavior: Behavior normal.        Thought Content: Thought content normal.    Assessment & Plan:  1: Acute on Chronic heart failure with reduced ejection fraction- - NYHA class III - fluid overloaded with weight gain, abdominal distention and SOB - weighing daily and she says that home weight has risen; reminded to call for an overnight weight gain of > 2 pounds or a weekly weight gain of > 5 pounds - weight up 10 pounds since her last visit here 5 weeks ago - has orders already in place to take 2 torsemide for 3 days for above weight gain or worsening abdominal swelling and did that earlier this week but without relief - since she has lost a couple of pounds with the torsemide and IV lasix, will continue '40mg'$  torsemide daily along with 4mq potassium daily - discussed abdominal ultrasound for possible paracentesis but she's not sure she can lay down long enough for this - not adding salt and daughter says that she doesn't cook with salt either - saw cardiology (Gilford Rile 09/22/22 - on GDMT of metoprolol - consider other GDMT but would need to to closely watch renal function - palliative care visit done 09/10/22 - BNP 09/16/22 was 4403.2 - check BNP today - unable to do REDS vest as it kept saying poor connectivity   2: HTN- - BP looks good (126/76) - saw PCP (Carlis Abbott 10/07/22 - BMP 10/07/22 reviewed and showed sodium 141, potassium 4.0, creatinine 1.83 & GFR 29.83  - recheck BMP today  3: Valvular disease- - not a candidate for surgical mitral valve replacement  4: NSVT- - on amiodarone therapy - check TSH/ LFT's today   Medication bottles reviewed.   Return on 10/23/22, sooner if  needed

## 2022-10-17 ENCOUNTER — Other Ambulatory Visit
Admission: RE | Admit: 2022-10-17 | Discharge: 2022-10-17 | Disposition: A | Discharge status: Home / Self Care | Payer: Medicare Other | Source: Ambulatory Visit | Attending: Family | Admitting: Family

## 2022-10-17 ENCOUNTER — Ambulatory Visit (HOSPITAL_BASED_OUTPATIENT_CLINIC_OR_DEPARTMENT_OTHER): Payer: Medicare Other | Admitting: Family

## 2022-10-17 ENCOUNTER — Telehealth (HOSPITAL_BASED_OUTPATIENT_CLINIC_OR_DEPARTMENT_OTHER): Payer: Self-pay

## 2022-10-17 ENCOUNTER — Encounter: Payer: Self-pay | Admitting: Family

## 2022-10-17 VITALS — BP 126/76 | Resp 16 | Ht 63.0 in | Wt 135.4 lb

## 2022-10-17 DIAGNOSIS — I255 Ischemic cardiomyopathy: Secondary | ICD-10-CM

## 2022-10-17 DIAGNOSIS — M549 Dorsalgia, unspecified: Secondary | ICD-10-CM | POA: Insufficient documentation

## 2022-10-17 DIAGNOSIS — I2721 Secondary pulmonary arterial hypertension: Secondary | ICD-10-CM | POA: Insufficient documentation

## 2022-10-17 DIAGNOSIS — I081 Rheumatic disorders of both mitral and tricuspid valves: Secondary | ICD-10-CM | POA: Insufficient documentation

## 2022-10-17 DIAGNOSIS — G35 Multiple sclerosis: Secondary | ICD-10-CM | POA: Insufficient documentation

## 2022-10-17 DIAGNOSIS — I110 Hypertensive heart disease with heart failure: Secondary | ICD-10-CM | POA: Insufficient documentation

## 2022-10-17 DIAGNOSIS — K219 Gastro-esophageal reflux disease without esophagitis: Secondary | ICD-10-CM | POA: Insufficient documentation

## 2022-10-17 DIAGNOSIS — I4729 Other ventricular tachycardia: Secondary | ICD-10-CM | POA: Diagnosis not present

## 2022-10-17 DIAGNOSIS — I472 Ventricular tachycardia, unspecified: Secondary | ICD-10-CM | POA: Insufficient documentation

## 2022-10-17 DIAGNOSIS — I10 Essential (primary) hypertension: Secondary | ICD-10-CM | POA: Diagnosis not present

## 2022-10-17 DIAGNOSIS — I2510 Atherosclerotic heart disease of native coronary artery without angina pectoris: Secondary | ICD-10-CM | POA: Insufficient documentation

## 2022-10-17 DIAGNOSIS — Z79899 Other long term (current) drug therapy: Secondary | ICD-10-CM | POA: Insufficient documentation

## 2022-10-17 DIAGNOSIS — R140 Abdominal distension (gaseous): Secondary | ICD-10-CM | POA: Insufficient documentation

## 2022-10-17 DIAGNOSIS — Z87891 Personal history of nicotine dependence: Secondary | ICD-10-CM | POA: Insufficient documentation

## 2022-10-17 DIAGNOSIS — J449 Chronic obstructive pulmonary disease, unspecified: Secondary | ICD-10-CM | POA: Insufficient documentation

## 2022-10-17 DIAGNOSIS — I340 Nonrheumatic mitral (valve) insufficiency: Secondary | ICD-10-CM

## 2022-10-17 DIAGNOSIS — I5023 Acute on chronic systolic (congestive) heart failure: Secondary | ICD-10-CM | POA: Insufficient documentation

## 2022-10-17 DIAGNOSIS — R42 Dizziness and giddiness: Secondary | ICD-10-CM | POA: Insufficient documentation

## 2022-10-17 LAB — BASIC METABOLIC PANEL
Anion gap: 7 (ref 5–15)
BUN: 34 mg/dL — ABNORMAL HIGH (ref 6–20)
CO2: 25 mmol/L (ref 22–32)
Calcium: 8.2 mg/dL — ABNORMAL LOW (ref 8.9–10.3)
Chloride: 109 mmol/L (ref 98–111)
Creatinine, Ser: 1.79 mg/dL — ABNORMAL HIGH (ref 0.44–1.00)
GFR, Estimated: 32 mL/min — ABNORMAL LOW (ref >60)
Glucose, Bld: 101 mg/dL — ABNORMAL HIGH (ref 70–99)
Potassium: 3.5 mmol/L (ref 3.5–5.1)
Sodium: 141 mmol/L (ref 135–145)

## 2022-10-17 LAB — TSH: TSH: 10.178 u[IU]/mL — ABNORMAL HIGH (ref 0.350–4.500)

## 2022-10-17 LAB — HEPATIC FUNCTION PANEL
ALT: 12 U/L (ref 0–44)
AST: 24 U/L (ref 15–41)
Albumin: 3.3 g/dL — ABNORMAL LOW (ref 3.5–5.0)
Alkaline Phosphatase: 110 U/L (ref 38–126)
Bilirubin, Direct: 0.4 mg/dL — ABNORMAL HIGH (ref 0.0–0.2)
Indirect Bilirubin: 0.7 mg/dL (ref 0.3–0.9)
Total Bilirubin: 1.1 mg/dL (ref 0.3–1.2)
Total Protein: 7.3 g/dL (ref 6.5–8.1)

## 2022-10-17 LAB — BRAIN NATRIURETIC PEPTIDE: B Natriuretic Peptide: 2529.4 pg/mL — ABNORMAL HIGH (ref 0.0–100.0)

## 2022-10-17 NOTE — Patient Instructions (Addendum)
Continue weighing daily and call for an overnight weight gain of 3 pounds or more or a weekly weight gain of more than 5 pounds.   If you have voicemail, please make sure your mailbox is cleaned out so that we may leave a message and please make sure to listen to any voicemails.    Continue taking torsemide 2 tablets daily with 2 potassium tablets daily.

## 2022-10-17 NOTE — Telephone Encounter (Addendum)
Results called to patient who verbalizes understanding! Thyroid Panel ordered and mailed to patient.     ----- Message from Loel Dubonnet, NP sent at 10/17/2022  3:35 PM EST ----- TSH is markedly elevated on Amiodarone.  Kidney function is stable and improving from prior. Normal electrolytes, liver. BNP improving from previous but still shows volume overload.   Can we please have her get a whole thyroid panel?  Will route to Dr. Harrell Gave for her input on Amiodarone.

## 2022-10-18 ENCOUNTER — Encounter (HOSPITAL_BASED_OUTPATIENT_CLINIC_OR_DEPARTMENT_OTHER): Payer: Self-pay

## 2022-10-19 MED ORDER — ATORVASTATIN CALCIUM 20 MG PO TABS: 20.0000 mg | ORAL_TABLET | Freq: Every day | ORAL | 1 refills | Status: DC

## 2022-10-20 ENCOUNTER — Telehealth (HOSPITAL_COMMUNITY): Payer: Self-pay

## 2022-10-20 NOTE — Telephone Encounter (Signed)
Spoke with Pats daughter and asked how she was doing.  Weight has come down 6 lbs to 129 lbs.  She is feeling a little better but still nauseated.  They are going to try her zofran to see if will help.  She is watching her fluids and low salt diet.    [place] [phone]

## 2022-10-22 ENCOUNTER — Telehealth (HOSPITAL_COMMUNITY): Payer: Self-pay

## 2022-10-22 NOTE — Telephone Encounter (Signed)
Today spoke to Pats daughter.  Her went down to 129.3 yesterday and up to 131.3 today.  She is feeling better last few days, not as much nausea and vomiting.  Appetite is good.  She is drinking close to 60 ozs a day.  Abdomen as went down a little.  She is aware of HF clinic appt tomorrow and will be there.  They have been taking the torsemide 2 a day since her visit with Otila Kluver.  Denies increased shortness of breath or palpatations.  Will continue to visit for heart failure, diet and medication compliance.   [place] [phone]

## 2022-10-23 ENCOUNTER — Encounter
Admission: RE | Admit: 2022-10-23 | Discharge: 2022-10-23 | Disposition: A | Discharge status: Home / Self Care | Payer: Medicare Other | Source: Ambulatory Visit | Attending: Family | Admitting: Family

## 2022-10-23 ENCOUNTER — Ambulatory Visit (HOSPITAL_BASED_OUTPATIENT_CLINIC_OR_DEPARTMENT_OTHER): Payer: Medicare Other | Admitting: Family

## 2022-10-23 ENCOUNTER — Encounter: Payer: Self-pay | Admitting: Family

## 2022-10-23 ENCOUNTER — Other Ambulatory Visit
Admission: RE | Admit: 2022-10-23 | Discharge: 2022-10-23 | Disposition: A | Discharge status: Home / Self Care | Payer: Medicare Other | Source: Ambulatory Visit | Attending: Family | Admitting: Family

## 2022-10-23 VITALS — BP 125/82 | HR 66 | Resp 16 | Wt 133.2 lb

## 2022-10-23 DIAGNOSIS — K219 Gastro-esophageal reflux disease without esophagitis: Secondary | ICD-10-CM | POA: Insufficient documentation

## 2022-10-23 DIAGNOSIS — I2510 Atherosclerotic heart disease of native coronary artery without angina pectoris: Secondary | ICD-10-CM | POA: Insufficient documentation

## 2022-10-23 DIAGNOSIS — J449 Chronic obstructive pulmonary disease, unspecified: Secondary | ICD-10-CM | POA: Insufficient documentation

## 2022-10-23 DIAGNOSIS — I5023 Acute on chronic systolic (congestive) heart failure: Secondary | ICD-10-CM | POA: Insufficient documentation

## 2022-10-23 DIAGNOSIS — Z79899 Other long term (current) drug therapy: Secondary | ICD-10-CM | POA: Insufficient documentation

## 2022-10-23 DIAGNOSIS — I255 Ischemic cardiomyopathy: Secondary | ICD-10-CM | POA: Diagnosis not present

## 2022-10-23 DIAGNOSIS — I4729 Other ventricular tachycardia: Secondary | ICD-10-CM | POA: Diagnosis not present

## 2022-10-23 DIAGNOSIS — D631 Anemia in chronic kidney disease: Secondary | ICD-10-CM | POA: Diagnosis not present

## 2022-10-23 DIAGNOSIS — I340 Nonrheumatic mitral (valve) insufficiency: Secondary | ICD-10-CM

## 2022-10-23 DIAGNOSIS — Z01818 Encounter for other preprocedural examination: Secondary | ICD-10-CM | POA: Insufficient documentation

## 2022-10-23 DIAGNOSIS — N1831 Chronic kidney disease, stage 3a: Secondary | ICD-10-CM | POA: Diagnosis not present

## 2022-10-23 DIAGNOSIS — Z87891 Personal history of nicotine dependence: Secondary | ICD-10-CM | POA: Insufficient documentation

## 2022-10-23 DIAGNOSIS — I5022 Chronic systolic (congestive) heart failure: Secondary | ICD-10-CM | POA: Insufficient documentation

## 2022-10-23 DIAGNOSIS — N1832 Chronic kidney disease, stage 3b: Secondary | ICD-10-CM | POA: Insufficient documentation

## 2022-10-23 DIAGNOSIS — I130 Hypertensive heart and chronic kidney disease with heart failure and stage 1 through stage 4 chronic kidney disease, or unspecified chronic kidney disease: Secondary | ICD-10-CM | POA: Insufficient documentation

## 2022-10-23 DIAGNOSIS — G35 Multiple sclerosis: Secondary | ICD-10-CM | POA: Diagnosis not present

## 2022-10-23 DIAGNOSIS — I272 Pulmonary hypertension, unspecified: Secondary | ICD-10-CM | POA: Diagnosis not present

## 2022-10-23 DIAGNOSIS — I10 Essential (primary) hypertension: Secondary | ICD-10-CM

## 2022-10-23 DIAGNOSIS — I2721 Secondary pulmonary arterial hypertension: Secondary | ICD-10-CM | POA: Insufficient documentation

## 2022-10-23 DIAGNOSIS — R0602 Shortness of breath: Secondary | ICD-10-CM | POA: Insufficient documentation

## 2022-10-23 DIAGNOSIS — N179 Acute kidney failure, unspecified: Secondary | ICD-10-CM | POA: Diagnosis not present

## 2022-10-23 DIAGNOSIS — I110 Hypertensive heart disease with heart failure: Secondary | ICD-10-CM | POA: Insufficient documentation

## 2022-10-23 DIAGNOSIS — I5043 Acute on chronic combined systolic (congestive) and diastolic (congestive) heart failure: Secondary | ICD-10-CM | POA: Diagnosis not present

## 2022-10-23 LAB — BASIC METABOLIC PANEL
Anion gap: 12 (ref 5–15)
BUN: 34 mg/dL — ABNORMAL HIGH (ref 6–20)
CO2: 24 mmol/L (ref 22–32)
Calcium: 8.4 mg/dL — ABNORMAL LOW (ref 8.9–10.3)
Chloride: 104 mmol/L (ref 98–111)
Creatinine, Ser: 1.73 mg/dL — ABNORMAL HIGH (ref 0.44–1.00)
GFR, Estimated: 34 mL/min — ABNORMAL LOW (ref >60)
Glucose, Bld: 92 mg/dL (ref 70–99)
Potassium: 3.5 mmol/L (ref 3.5–5.1)
Sodium: 140 mmol/L (ref 135–145)

## 2022-10-23 LAB — BRAIN NATRIURETIC PEPTIDE: B Natriuretic Peptide: 2466.8 pg/mL — ABNORMAL HIGH (ref 0.0–100.0)

## 2022-10-23 NOTE — Patient Instructions (Signed)
Continue weighing daily and call for an overnight weight gain of 3 pounds or more or a weekly weight gain of more than 5 pounds.   If you have voicemail, please make sure your mailbox is cleaned out so that we may leave a message and please make sure to listen to any voicemails.     

## 2022-10-23 NOTE — Progress Notes (Signed)
Patient ID: Anita [name], female    DOB: [birth year], 61 y.o.   MRN: [id number]  HPI  Anita [name] is a 61 y/o female with a history of CAD, COPD, GERD, multiple sclerosis, pulmonary HTN, severe valvular disease, previous tobacco use and chronic heart failure.   Echo report from 08/22/22 reviewed and showed an EF of 30-35% along with mild LVH and severe MR/TR. Echo report from 02/07/21 reviewed and showed an EF of 40-45% along with severely elevated PA pressure of 69.2 mmHg, moderate LAE, severe MR, mild/ moderate Anita and moderate/severe TR.   LHC done 03/23/20 showed: Hemodynamics: LV end diastolic pressure is moderately elevated. ----Coronary Angiography----- Ost RCA to Dist RCA lesion is 100% stenosed.-The PDA and PL system fills via faint collaterals from the AV groove LCx and LAD septals. Mid LAD-1 lesion is 60% stenosed. Mid LAD-2 lesion is 50% stenosed. Dist LAD lesion is 55% stenosed with 70% stenosed side branch in 2nd Diag. 1st Diag lesion is 70% stenosed. Ost Cx to Prox Cx lesion is 45% stenosed. Prox-MID Cx lesion is 65% stenosed.   MODERATE-SEVERE THREE-VESSEL CAD: 100% proximal RCA occlusion with left-to-right collaterals faintly filling PDA and PL system (AV groove LCx-PL and LAD septal-PDA) Diffuse moderate mid LAD disease with 60% to 50% stenosis. Tandem 50% and 65% proximal and mid LCx with the 65% lesion being the most significant lesion. Moderately elevated LVEDP of 18-20 mmHg  Admitted 09/16/22 due to SVT. Cardiology consult obtained. Given IV adenosine and then amiodarone drip. Plan for EP consult. Discharged after 3 days.   Admitted 08/21/22 due to increased facial and abdominal swelling for the past few weeks after not taking home diuretics for a few days. Given a dose of IV lasix but then placed on lasix drip. Cardiology and nephrology consults obtained. Elevated troponin thought to be due to demand ischemia. PT/OT evaluations done. Lasix drip stopped and  transitioned to oral diuretics. Discharged after 11 days.     Anita [name] presents today for a follow-up visit with a chief complaint of moderate fatigue with minimal exertion. Describes this as chronic in nature. Anita [name] has associated shortness of breath, abdominal distention, light-headedness and chronic back pain along with this. Anita [name] denies any difficulty sleeping, palpitations, pedal edema, chest pain, cough or weight gain.   Says that Anita [name] feels like Anita [name] may be feeling a little bit better than the last time Anita [name] was here.   Past Medical History:  Diagnosis Date   Abnormality of gait 07/26/2013   Acute respiratory failure with hypoxia ([place]) 03/20/2020   Allergy    Arthritis    Atypical pneumonia 03/22/2020   CAD (coronary artery disease)    a. 03/2020 Cath: LM nl, LAD 60/48m 55d, D1 70, D2 70, LCX 45ost/p, 65p, RCA 100ost CTO. RPDA fills via collats from dLAD. Inf septal fills via collats from 1st septal, RPAV fills via collats from LPAV, RPL1/2 sev dzs-->med rx.   Chickenpox    Chronic combined systolic (congestive) and diastolic (congestive) heart failure ([place]    a. 02/2021 Echo: EF 40-45%, glob HK, gr2 DD. Sev red RV fxn, RVSP 69.264mg. Mod dil LA, sev dil RA. Sev MR. Mild to mod Anita. Mean MV grad 6.8m93m. Mod-Sev TR. Mild AI. Mild Ao sclerosis w/o stenosis.   COPD (chronic obstructive pulmonary disease) (HCC)    Elevated troponin 03/22/2020   GERD (gastroesophageal reflux disease)    Headache(784.0)    Migraine   Hearing loss    Right ear secondary to infection  History of shingles    Ischemic cardiomyopathy    a. 02/2021 Echo: EF 40-45%, glob HK.   Migraines    Multiple sclerosis (HCC)    Obese    Optic neuritis    PAH (pulmonary artery hypertension) ([place])    a. 02/2021 Echo: RVSP 69.75mHg.   Prolonged QT interval 03/20/2020   Severe mitral regurgitation    a. 02/2021 Echo: Severe MR w/ mild to mod Anita.  Mean MV grad 6.550mg.   Past Surgical History:  Procedure Laterality Date    COLONOSCOPY WITH PROPOFOL N/A 08/03/2020   Procedure: COLONOSCOPY WITH PROPOFOL;  Surgeon: AnJonathon BellowsMD;  [facility];  Service: Gastroenterology;  Laterality: N/A;   Ganglioneuroma     Resection   LEFT HEART CATH AND CORONARY ANGIOGRAPHY N/A 03/23/2020   Procedure: LEFT HEART CATH AND CORONARY ANGIOGRAPHY;  Surgeon: HaLeonie ManMD;  [facility];  Service: Cardiovascular;  Laterality: N/A;   PILONIDAL CYST EXCISION     PILONIDAL CYST EXCISION  1983   TUMOR REMOVAL  2007/2008   Family History  Problem Relation Age of Onset   Skin cancer Mother    Lung cancer Father    Uterine cancer Sister    Heart disease Brother    Bipolar disorder Sister    Throat cancer Paternal Grandmother    Social History   Tobacco Use   Smoking status: Former    Packs/day: 1.00    Types: Cigarettes   Smokeless tobacco: Never  Substance Use Topics   Alcohol use: Yes    Comment: Occasional   Allergies  Allergen Reactions   Acthar Hp [Corticotropin] Other (See Comments)    Throat swelling and coughing up blood   Codeine    Prednisone    Prior to Admission medications   Medication Sig Start Date End Date Taking? Authorizing Provider  albuterol (VENTOLIN HFA) 108 (90 Base) MCG/ACT inhaler INHALE 1-2 PUFFS INTO THE LUNGS EVERY 6 (SIX) HOURS AS NEEDED FOR WHEEZING OR SHORTNESS OF BREATH. USE SPARINGLY! 11/13/21  Yes ClPleas KochNP  aspirin EC 81 MG tablet Take 1 tablet (81 mg total) by mouth daily. Swallow whole. 09/20/22  Yes AmNolberto HanlonMD  atorvastatin (LIPITOR) 20 MG tablet Take 1 tablet (20 mg total) by mouth at bedtime. 10/19/22 04/17/23 Yes WaLoel DubonnetNP  DULoxetine (CYMBALTA) 20 MG capsule Take 1 capsule (20 mg total) by mouth daily. For anxiety and pain. 09/16/22  Yes ClPleas KochNP  feeding supplement, ENSURE ENLIVE, (ENSURE ENLIVE) LIQD Take 237 mLs by mouth 2 (two) times daily between meals. 03/28/20  Yes MaBarton DuboisMD  ferrous  sulfate 325 (65 FE) MG EC tablet Take 1 tablet (325 mg total) by mouth daily. 10/01/22  Yes YuEarlie ServerMD  fluticasone-salmeterol (ADVAIR DISKUS) 250-50 MCG/ACT AEPB INHALE 1 PUFF INTO THE LUNGS TWICE A DAY 12/02/21  Yes ClPleas KochNP  metoprolol succinate (TOPROL-XL) 25 MG 24 hr tablet Take 1 tablet (25 mg total) by mouth daily. Take with or immediately following a meal. 01/01/22 12/27/22 Yes ChBuford DresserMD  nitroGLYCERIN (NITROSTAT) 0.4 MG SL tablet Place 1 tablet (0.4 mg total) under the tongue every 5 (five) minutes as needed for chest pain. 08/02/21  Yes HaDarylene Price, FNP  omeprazole (PRILOSEC) 40 MG capsule Take 1 capsule (40 mg total) by mouth daily. 03/04/21  Yes AnJonathon BellowsMD  Pediatric Multiple Vitamins (FLINTSTONES MULTIVITAMIN) CHEW Chew by mouth.   Yes [provider]  potassium chloride SA (KLOR-CON M) 20 MEQ tablet Take 2 potassium tablets today and then take 1 potassium for every torsemide you take 10/16/22  Yes [name] A, FNP  Probiotic Product (PROBIOTIC ADVANCED) CAPS Take 1 capsule by mouth daily.   Yes [provider]  torsemide (DEMADEX) 20 MG tablet Take for weight gain of 2 lbs in 1 day or 5 lbs in 1 week or obvious swelling. 01/01/22  Yes Buford Dresser, MD  trolamine salicylate (ASPERCREME) 10 % cream Apply 1 application. topically daily as needed for muscle pain (back or joint pain).   Yes [provider]  vitamin B-12 (CYANOCOBALAMIN) 500 MCG tablet Take 500 mcg by mouth 4 (four) times a week. 4 times weekly no more than 2000 MCG per week   Yes [provider]  amiodarone (PACERONE) 200 MG tablet Take 1 tablet (200 mg total) by mouth 2 (two) times daily.  09/22/22   Loel Dubonnet, NP   Review of Systems  Constitutional:  Positive for fatigue. Negative for appetite change.  HENT:  Negative for congestion, postnasal drip and sore throat.   Eyes: Negative.   Respiratory:  Positive for shortness of  breath. Negative for cough and chest tightness.   Cardiovascular:  Negative for chest pain, palpitations and leg swelling.  Gastrointestinal:  Positive for abdominal distention. Negative for abdominal pain.  Endocrine: Negative.   Genitourinary: Negative.   Musculoskeletal:  Positive for back pain. Negative for neck pain.  Skin: Negative.   Allergic/Immunologic: Negative.   Neurological:  Positive for light-headedness (at times). Negative for dizziness.  Hematological:  Negative for adenopathy. Does not bruise/bleed easily.  Psychiatric/Behavioral:  Negative for dysphoric mood and sleep disturbance (sleeping on 2-3 pillows).    Vitals:   10/23/22 1351  BP: 125/82  Pulse: 66  Resp: 16  SpO2: 99%  Weight: 133 lb 4 oz (60.4 kg)   Wt Readings from Last 3 Encounters:  10/23/22 133 lb 4 oz (60.4 kg)  10/17/22 135 lb 6 oz (61.4 kg)  10/16/22 134 lb 4.8 oz (60.9 kg)   Lab Results  Component Value Date   CREATININE 1.79 (H) 10/17/2022   CREATININE 1.83 (H) 10/07/2022   CREATININE 2.15 (H) 09/19/2022   Physical Exam Vitals and nursing note reviewed. Exam conducted with a chaperone present (daughter).  Constitutional:      Appearance: Normal appearance.  HENT:     Head: Normocephalic and atraumatic.  Cardiovascular:     Rate and Rhythm: Normal rate and regular rhythm.     Heart sounds: Murmur heard.  Pulmonary:     Effort: Pulmonary effort is normal. No respiratory distress.     Breath sounds: No wheezing or rales.  Abdominal:     General: There is distension.     Tenderness: There is no abdominal tenderness.  Musculoskeletal:        General: No tenderness.     Right lower leg: No edema.     Left lower leg: No edema.  Skin:    General: Skin is warm and dry.  Neurological:     General: No focal deficit present.     Mental Status: Anita [name] is alert and oriented to person, place, and time. Mental status is at baseline.  Psychiatric:        Mood and Affect: Mood normal.         Behavior: Behavior normal.        Thought Content: Thought content normal.  Assessment & Plan:  1: Chronic heart failure with reduced ejection fraction- - NYHA class III - euvolemic today - weighing daily; reminded to call for an overnight weight gain of > 2 pounds or a weekly weight gain of > 5 pounds - weight down 2 pounds since her last visit here 6 days ago - discussed abdominal ultrasound for possible paracentesis but daughter is getting ready to have surgery and will be unable to assist with anything until the end of January - they will check with patient's brother to see if he can assist in getting patient to ultrasound and, if so, they will call back to schedule this, otherwise, plan to order this at next appt - not adding salt and daughter says that Anita [name] doesn't cook with salt either - saw cardiology Gilford Rile) 09/22/22 - on GDMT of metoprolol - consider other GDMT but would need to to closely watch renal function - palliative care visit done 09/10/22 - BNP 10/17/22 was 2529.4 - check BNP today  2: HTN- - BP looks good (125/82) - saw PCP Carlis Abbott) 10/07/22 - BMP 10/17/22 reviewed and showed sodium 141, potassium 3.5, creatinine 1.79 & GFR 32  - recheck BMP today  3: Valvular disease- - not a candidate for surgical mitral valve replacement  4: NSVT- - on amiodarone therapy - has to get complete thyroid panel drawn today per cardiology since TSH is elevated   Medication list reviewed.   Return in 2 months, sooner if needed.

## 2022-10-24 LAB — THYROID PANEL WITH TSH
Free Thyroxine Index: 3.1 (ref 1.2–4.9)
T3 Uptake Ratio: 33 % (ref 24–39)
T4, Total: 9.3 ug/dL (ref 4.5–12.0)
TSH: 6.4 u[IU]/mL — ABNORMAL HIGH (ref 0.450–4.500)

## 2022-10-28 ENCOUNTER — Encounter: Payer: Self-pay | Admitting: Family

## 2022-10-28 ENCOUNTER — Other Ambulatory Visit: Payer: Self-pay | Admitting: Family

## 2022-10-28 DIAGNOSIS — I5022 Chronic systolic (congestive) heart failure: Secondary | ICD-10-CM

## 2022-11-01 DIAGNOSIS — D509 Iron deficiency anemia, unspecified: Secondary | ICD-10-CM | POA: Diagnosis not present

## 2022-11-01 DIAGNOSIS — J449 Chronic obstructive pulmonary disease, unspecified: Secondary | ICD-10-CM | POA: Diagnosis not present

## 2022-11-01 DIAGNOSIS — I5043 Acute on chronic combined systolic (congestive) and diastolic (congestive) heart failure: Secondary | ICD-10-CM | POA: Diagnosis not present

## 2022-11-01 DIAGNOSIS — I081 Rheumatic disorders of both mitral and tricuspid valves: Secondary | ICD-10-CM | POA: Diagnosis not present

## 2022-11-01 DIAGNOSIS — E876 Hypokalemia: Secondary | ICD-10-CM | POA: Diagnosis not present

## 2022-11-01 DIAGNOSIS — K7460 Unspecified cirrhosis of liver: Secondary | ICD-10-CM | POA: Diagnosis not present

## 2022-11-01 DIAGNOSIS — R188 Other ascites: Secondary | ICD-10-CM | POA: Diagnosis not present

## 2022-11-01 DIAGNOSIS — I420 Dilated cardiomyopathy: Secondary | ICD-10-CM | POA: Diagnosis not present

## 2022-11-01 DIAGNOSIS — G35 Multiple sclerosis: Secondary | ICD-10-CM | POA: Diagnosis not present

## 2022-11-01 DIAGNOSIS — I2510 Atherosclerotic heart disease of native coronary artery without angina pectoris: Secondary | ICD-10-CM | POA: Diagnosis not present

## 2022-11-01 DIAGNOSIS — N1832 Chronic kidney disease, stage 3b: Secondary | ICD-10-CM | POA: Diagnosis not present

## 2022-11-01 DIAGNOSIS — I130 Hypertensive heart and chronic kidney disease with heart failure and stage 1 through stage 4 chronic kidney disease, or unspecified chronic kidney disease: Secondary | ICD-10-CM | POA: Diagnosis not present

## 2022-11-01 DIAGNOSIS — Z7951 Long term (current) use of inhaled steroids: Secondary | ICD-10-CM | POA: Diagnosis not present

## 2022-11-01 DIAGNOSIS — I471 Supraventricular tachycardia, unspecified: Secondary | ICD-10-CM | POA: Diagnosis not present

## 2022-11-01 DIAGNOSIS — I272 Pulmonary hypertension, unspecified: Secondary | ICD-10-CM | POA: Diagnosis not present

## 2022-11-01 DIAGNOSIS — F17200 Nicotine dependence, unspecified, uncomplicated: Secondary | ICD-10-CM | POA: Diagnosis not present

## 2022-11-05 DIAGNOSIS — I420 Dilated cardiomyopathy: Secondary | ICD-10-CM | POA: Diagnosis not present

## 2022-11-05 DIAGNOSIS — I471 Supraventricular tachycardia, unspecified: Secondary | ICD-10-CM | POA: Diagnosis not present

## 2022-11-05 DIAGNOSIS — K7460 Unspecified cirrhosis of liver: Secondary | ICD-10-CM | POA: Diagnosis not present

## 2022-11-05 DIAGNOSIS — N1832 Chronic kidney disease, stage 3b: Secondary | ICD-10-CM | POA: Diagnosis not present

## 2022-11-05 DIAGNOSIS — I130 Hypertensive heart and chronic kidney disease with heart failure and stage 1 through stage 4 chronic kidney disease, or unspecified chronic kidney disease: Secondary | ICD-10-CM | POA: Diagnosis not present

## 2022-11-05 DIAGNOSIS — I5043 Acute on chronic combined systolic (congestive) and diastolic (congestive) heart failure: Secondary | ICD-10-CM | POA: Diagnosis not present

## 2022-11-10 ENCOUNTER — Ambulatory Visit
Admission: RE | Admit: 2022-11-10 | Discharge: 2022-11-10 | Disposition: A | Discharge status: Home / Self Care | Payer: Medicare Other | Source: Ambulatory Visit | Attending: Family | Admitting: Family

## 2022-11-10 DIAGNOSIS — I5022 Chronic systolic (congestive) heart failure: Secondary | ICD-10-CM | POA: Insufficient documentation

## 2022-11-10 DIAGNOSIS — R188 Other ascites: Secondary | ICD-10-CM | POA: Diagnosis not present

## 2022-11-10 MED ORDER — LIDOCAINE HCL (PF) 1 % IJ SOLN
10.0000 mL | Freq: Once | INTRAMUSCULAR | Status: AC
  Administered 2022-11-10: 10 mL via INTRADERMAL

## 2022-11-10 NOTE — Procedures (Signed)
PROCEDURE SUMMARY:  Successful US guided therapeutic paracentesis from RLQ.  Yielded 6.6 L of hazy, yellow fluid.  No immediate complications.  Pt tolerated well.   Specimen not sent for labs.  EBL < 1 mL  Tyson Alias, AGNP 11/10/2022 10:32 AM

## 2022-11-11 DIAGNOSIS — I420 Dilated cardiomyopathy: Secondary | ICD-10-CM | POA: Diagnosis not present

## 2022-11-11 DIAGNOSIS — N1832 Chronic kidney disease, stage 3b: Secondary | ICD-10-CM | POA: Diagnosis not present

## 2022-11-11 DIAGNOSIS — I5043 Acute on chronic combined systolic (congestive) and diastolic (congestive) heart failure: Secondary | ICD-10-CM | POA: Diagnosis not present

## 2022-11-11 DIAGNOSIS — K7460 Unspecified cirrhosis of liver: Secondary | ICD-10-CM | POA: Diagnosis not present

## 2022-11-11 DIAGNOSIS — I471 Supraventricular tachycardia, unspecified: Secondary | ICD-10-CM | POA: Diagnosis not present

## 2022-11-11 DIAGNOSIS — I130 Hypertensive heart and chronic kidney disease with heart failure and stage 1 through stage 4 chronic kidney disease, or unspecified chronic kidney disease: Secondary | ICD-10-CM | POA: Diagnosis not present

## 2022-11-13 ENCOUNTER — Other Ambulatory Visit (HOSPITAL_COMMUNITY): Payer: Self-pay

## 2022-11-13 ENCOUNTER — Encounter (HOSPITAL_COMMUNITY): Payer: Self-pay

## 2022-11-13 NOTE — Progress Notes (Signed)
Today had a home visit with Pat.  She looks good.  She states feels much better since they pulled 6.6 liters off on Monday.  Abdomen is soft. She states nausea has went away and now gets hungry.  Appetite is good.  She has no edema in legs.  Lungs are clear.  Heart rate has been normal rate.  She is in the living room in recliner.  She has family staying since her daughter had surgery.  Her mood is good.  She has everything for daily living.  She has goof family support.  Her daughter does her medications in a med box.  She watches her fluid and high sodium foods.  Will continue to visit for heart failure, diet and medication management.   [place] [phone]

## 2022-11-17 ENCOUNTER — Telehealth: Payer: Self-pay

## 2022-11-17 DIAGNOSIS — I130 Hypertensive heart and chronic kidney disease with heart failure and stage 1 through stage 4 chronic kidney disease, or unspecified chronic kidney disease: Secondary | ICD-10-CM | POA: Diagnosis not present

## 2022-11-17 DIAGNOSIS — N1832 Chronic kidney disease, stage 3b: Secondary | ICD-10-CM | POA: Diagnosis not present

## 2022-11-17 DIAGNOSIS — I471 Supraventricular tachycardia, unspecified: Secondary | ICD-10-CM | POA: Diagnosis not present

## 2022-11-17 DIAGNOSIS — I420 Dilated cardiomyopathy: Secondary | ICD-10-CM | POA: Diagnosis not present

## 2022-11-17 DIAGNOSIS — K7460 Unspecified cirrhosis of liver: Secondary | ICD-10-CM | POA: Diagnosis not present

## 2022-11-17 DIAGNOSIS — I5043 Acute on chronic combined systolic (congestive) and diastolic (congestive) heart failure: Secondary | ICD-10-CM | POA: Diagnosis not present

## 2022-11-17 NOTE — Telephone Encounter (Signed)
240 pm.  Phone call made to patient's daughter Jenny Reichmann to schedule a home visit. No answer.  Message left requesting a call back.

## 2022-11-27 ENCOUNTER — Telehealth: Payer: Self-pay | Admitting: Family

## 2022-11-27 ENCOUNTER — Encounter: Payer: Self-pay | Admitting: Primary Care

## 2022-11-27 ENCOUNTER — Other Ambulatory Visit: Payer: Self-pay | Admitting: Family

## 2022-11-27 ENCOUNTER — Telehealth: Payer: Self-pay | Admitting: Primary Care

## 2022-11-27 DIAGNOSIS — N1832 Chronic kidney disease, stage 3b: Secondary | ICD-10-CM | POA: Diagnosis not present

## 2022-11-27 DIAGNOSIS — I471 Supraventricular tachycardia, unspecified: Secondary | ICD-10-CM | POA: Diagnosis not present

## 2022-11-27 DIAGNOSIS — I130 Hypertensive heart and chronic kidney disease with heart failure and stage 1 through stage 4 chronic kidney disease, or unspecified chronic kidney disease: Secondary | ICD-10-CM | POA: Diagnosis not present

## 2022-11-27 DIAGNOSIS — I420 Dilated cardiomyopathy: Secondary | ICD-10-CM | POA: Diagnosis not present

## 2022-11-27 DIAGNOSIS — I5043 Acute on chronic combined systolic (congestive) and diastolic (congestive) heart failure: Secondary | ICD-10-CM | POA: Diagnosis not present

## 2022-11-27 DIAGNOSIS — I5022 Chronic systolic (congestive) heart failure: Secondary | ICD-10-CM

## 2022-11-27 DIAGNOSIS — K7460 Unspecified cirrhosis of liver: Secondary | ICD-10-CM | POA: Diagnosis not present

## 2022-11-27 NOTE — Telephone Encounter (Signed)
[facility] called to report an out of perimeter weight on patient: [place] 132. 2 Abdomen :distended  Has vomited 3to 4 times today

## 2022-11-27 NOTE — Telephone Encounter (Signed)
Called and spoke with Tammy, advised she needed to make patients cardiologist aware of this information as well. Forwarding to [place] to review. She stated she would call and advise cardiology of this information.

## 2022-11-27 NOTE — Telephone Encounter (Signed)
Received phone call from Otilio Saber with [facility] to report weight gain, abdominal distention and nausea.   Weight today is 132.2 pounds and 10 days ago, it was 123 pounds. She had a paracentesis done 12/4 with removal of >6 liters with subsequent drop in weight to 119 pounds.   Have placed call to scheduling about getting paracentesis done tomorrow. Will need GI follow-up if not already established with someone. Will call patient once scheduling checks on their schedule.

## 2022-11-27 NOTE — Telephone Encounter (Signed)
Spoke to patients daughter regarding the appointment time we scheduled for tomorrow 12/22 for a paracentisis and daughter states she has no transportation right now as she is 2 weeks post op from a abdominal hysterectomy and cannot get her in a car and transport her. I advised daughter she may need to call ambulance and get her to [place].   Raylie Maddison, NT

## 2022-11-27 NOTE — Telephone Encounter (Signed)
It looks like the CHF clinic is involved.  I will defer treatment to them.

## 2022-11-27 NOTE — Telephone Encounter (Signed)
error 

## 2022-11-28 ENCOUNTER — Ambulatory Visit
Admission: RE | Admit: 2022-11-28 | Discharge: 2022-11-28 | Disposition: A | Discharge status: Home / Self Care | Payer: Medicare Other | Source: Ambulatory Visit | Attending: Family | Admitting: Family

## 2022-11-28 DIAGNOSIS — R188 Other ascites: Secondary | ICD-10-CM | POA: Diagnosis not present

## 2022-11-28 DIAGNOSIS — I5022 Chronic systolic (congestive) heart failure: Secondary | ICD-10-CM | POA: Insufficient documentation

## 2022-11-28 DIAGNOSIS — I5020 Unspecified systolic (congestive) heart failure: Secondary | ICD-10-CM | POA: Diagnosis not present

## 2022-11-28 MED ORDER — LIDOCAINE HCL (PF) 1 % IJ SOLN
10.0000 mL | Freq: Once | INTRAMUSCULAR | Status: AC
  Administered 2022-11-28: 10 mL via INTRADERMAL
  Filled 2022-11-28: qty 10

## 2022-12-01 DIAGNOSIS — I130 Hypertensive heart and chronic kidney disease with heart failure and stage 1 through stage 4 chronic kidney disease, or unspecified chronic kidney disease: Secondary | ICD-10-CM | POA: Diagnosis not present

## 2022-12-01 DIAGNOSIS — I471 Supraventricular tachycardia, unspecified: Secondary | ICD-10-CM | POA: Diagnosis not present

## 2022-12-01 DIAGNOSIS — I5043 Acute on chronic combined systolic (congestive) and diastolic (congestive) heart failure: Secondary | ICD-10-CM | POA: Diagnosis not present

## 2022-12-01 DIAGNOSIS — J449 Chronic obstructive pulmonary disease, unspecified: Secondary | ICD-10-CM | POA: Diagnosis not present

## 2022-12-01 DIAGNOSIS — Z7951 Long term (current) use of inhaled steroids: Secondary | ICD-10-CM | POA: Diagnosis not present

## 2022-12-01 DIAGNOSIS — I081 Rheumatic disorders of both mitral and tricuspid valves: Secondary | ICD-10-CM | POA: Diagnosis not present

## 2022-12-01 DIAGNOSIS — I420 Dilated cardiomyopathy: Secondary | ICD-10-CM | POA: Diagnosis not present

## 2022-12-01 DIAGNOSIS — K7460 Unspecified cirrhosis of liver: Secondary | ICD-10-CM | POA: Diagnosis not present

## 2022-12-01 DIAGNOSIS — I2510 Atherosclerotic heart disease of native coronary artery without angina pectoris: Secondary | ICD-10-CM | POA: Diagnosis not present

## 2022-12-01 DIAGNOSIS — I272 Pulmonary hypertension, unspecified: Secondary | ICD-10-CM | POA: Diagnosis not present

## 2022-12-01 DIAGNOSIS — E876 Hypokalemia: Secondary | ICD-10-CM | POA: Diagnosis not present

## 2022-12-01 DIAGNOSIS — R188 Other ascites: Secondary | ICD-10-CM | POA: Diagnosis not present

## 2022-12-01 DIAGNOSIS — N1832 Chronic kidney disease, stage 3b: Secondary | ICD-10-CM | POA: Diagnosis not present

## 2022-12-01 DIAGNOSIS — G35 Multiple sclerosis: Secondary | ICD-10-CM | POA: Diagnosis not present

## 2022-12-01 DIAGNOSIS — F17200 Nicotine dependence, unspecified, uncomplicated: Secondary | ICD-10-CM | POA: Diagnosis not present

## 2022-12-01 DIAGNOSIS — D509 Iron deficiency anemia, unspecified: Secondary | ICD-10-CM | POA: Diagnosis not present

## 2022-12-03 ENCOUNTER — Telehealth: Payer: Self-pay | Admitting: Family

## 2022-12-03 NOTE — Telephone Encounter (Signed)
Followed up with patients daughter to see if she has a GI doctor to follow up from her recent paracentesis. Patients daughter said she had a GI doctor in the past and said she would call them to see if she can get an appointment.    Anita [name], NT

## 2022-12-05 ENCOUNTER — Encounter: Payer: Self-pay | Admitting: Family

## 2022-12-05 DIAGNOSIS — I5043 Acute on chronic combined systolic (congestive) and diastolic (congestive) heart failure: Secondary | ICD-10-CM | POA: Diagnosis not present

## 2022-12-05 DIAGNOSIS — K7460 Unspecified cirrhosis of liver: Secondary | ICD-10-CM | POA: Diagnosis not present

## 2022-12-05 DIAGNOSIS — I420 Dilated cardiomyopathy: Secondary | ICD-10-CM | POA: Diagnosis not present

## 2022-12-05 DIAGNOSIS — I471 Supraventricular tachycardia, unspecified: Secondary | ICD-10-CM | POA: Diagnosis not present

## 2022-12-05 DIAGNOSIS — I130 Hypertensive heart and chronic kidney disease with heart failure and stage 1 through stage 4 chronic kidney disease, or unspecified chronic kidney disease: Secondary | ICD-10-CM | POA: Diagnosis not present

## 2022-12-05 DIAGNOSIS — N1832 Chronic kidney disease, stage 3b: Secondary | ICD-10-CM | POA: Diagnosis not present

## 2022-12-10 ENCOUNTER — Ambulatory Visit: Payer: Medicare Other

## 2022-12-10 ENCOUNTER — Encounter: Payer: Self-pay | Admitting: Gastroenterology

## 2022-12-10 ENCOUNTER — Other Ambulatory Visit: Payer: Self-pay | Admitting: Gastroenterology

## 2022-12-10 ENCOUNTER — Ambulatory Visit: Payer: Self-pay

## 2022-12-10 ENCOUNTER — Ambulatory Visit (INDEPENDENT_AMBULATORY_CARE_PROVIDER_SITE_OTHER): Payer: Medicare Other | Admitting: Gastroenterology

## 2022-12-10 ENCOUNTER — Other Ambulatory Visit: Payer: Self-pay | Admitting: Primary Care

## 2022-12-10 VITALS — BP 115/81 | HR 75 | Temp 98.2°F | Ht 63.0 in | Wt 131.2 lb

## 2022-12-10 DIAGNOSIS — R188 Other ascites: Secondary | ICD-10-CM

## 2022-12-10 DIAGNOSIS — R7989 Other specified abnormal findings of blood chemistry: Secondary | ICD-10-CM | POA: Diagnosis not present

## 2022-12-10 DIAGNOSIS — R748 Abnormal levels of other serum enzymes: Secondary | ICD-10-CM

## 2022-12-10 DIAGNOSIS — M159 Polyosteoarthritis, unspecified: Secondary | ICD-10-CM

## 2022-12-10 NOTE — Addendum Note (Signed)
Addended by: Wayna Chalet on: 12/10/2022 03:03 PM   Modules accepted: Orders

## 2022-12-10 NOTE — Patient Instructions (Addendum)
Please call [phone] to schedule your ultrasounds.   You will also receive a call from Specialty Scheduling [phone] to schedule your paracentesis.  Please remember to be fasting 4 hours prior to your ultrasounds.

## 2022-12-10 NOTE — Progress Notes (Signed)
Anita Bellows MD, MRCP(U.K) [address]  [place]  [place], [place] [address]  Main: [phone]  Fax: [phone]   Primary Care Physician: [name], NP  Primary Gastroenterologist:  Dr. Jonathon [name]   Chief Complaint  Patient presents with   Follow-up    HPI: Anita [name] is a 62 y.o. female   Summary of history : Initially referred and seen in July 2021 for normocytic anemia and rectal bleeding likely due to a mutation from iron deficiency as well as CKD, B12 deficiency.   She also has a history of multiple sclerosis.Seen and evaluated by cardiology.  Status post cardiac catheterization.  RCA 100% stenosed.   05/03/2020: Hemoglobin 8.9 g MCV of 82.9.  Hemoglobin was 9.2 g 3 months prior.  MCV at that time was 94.8. Creatinine of 1.51  07/02/2020: Iron studies showed a normal ferritin of 91 TIBC was not elevated.,  Celiac serology was negative.  Could not obtain a B12 level due to insurance issues.  Vitamin C levels were normal. 08/03/2020 underwent a colonoscopy.  Diverticulosis of the sigmoid colon was noted and nonbleeding internal hemorrhoids were also noted which were large in nature but nonbleeding.  5 mm polyp was resected in the transverse colon with a cold snare.  It was a tubular adenoma.   01/08/2021 seen by Dr. Tasia [name] as an anemia due to a combination of iron deficiency as well as chronic kidney disease patient plan was to continue B12 and iron supplementation   In February 2022 seen by cardiology for shortness of breath which was felt to be likely multifactorial from cardiomyopathy and known mitral regurgitation which is moderate to severe, COPD  In 2022 her dysphagia was better after starting Prilosec.  Barium swallow showed no abnormality.    Interval history  04/04/2021-12/10/2022     She has presently been referred for ascites.   10/23/2022 TSH elevated at 6.4 Free T4 T3 normal.  BNP elevated at 2-1/2 thousand.  Creatinine 1.73 hepatic function  panel shows albumin of 3.3 alkaline phosphatase 110 total bilirubin of 1.1  No recent abdominal imaging 06/04/2021 CT abdomen and pelvis without contrast showed small volume ascites   Active medications include amiodarone, torsemide  She says that she is here to see me today because she has had 2 rounds of paracentesis and over 12 L of fluid has been taken out.  She says that cardiology asked her to come and see [place] to find out if it is coming out from her liver.  She feels short of breath when she lays flat.  Unfortunately I cannot see any labs of the ascites fluid that have been checked.  No known prior liver disease. Current Outpatient Medications  Medication Sig Dispense Refill   albuterol (VENTOLIN HFA) 108 (90 Base) MCG/ACT inhaler INHALE 1-2 PUFFS INTO THE LUNGS EVERY 6 (SIX) HOURS AS NEEDED FOR WHEEZING OR SHORTNESS OF BREATH. USE SPARINGLY! 18 each 0   amiodarone (PACERONE) 200 MG tablet Take 1 tablet (200 mg total) by mouth 2 (two) times daily. 180 tablet 3   aspirin EC 81 MG tablet Take 1 tablet (81 mg total) by mouth daily. Swallow whole. 30 tablet 0   atorvastatin (LIPITOR) 20 MG tablet Take 1 tablet (20 mg total) by mouth at bedtime. 90 tablet 1   DULoxetine (CYMBALTA) 20 MG capsule Take 1 capsule (20 mg total) by mouth daily. For anxiety and pain. 90 capsule 0   feeding supplement, ENSURE ENLIVE, (ENSURE ENLIVE)  LIQD Take 237 mLs by mouth 2 (two) times daily between meals.     ferrous sulfate 325 (65 FE) MG EC tablet Take 1 tablet (325 mg total) by mouth daily. 60 tablet 3   fluticasone-salmeterol (ADVAIR DISKUS) 250-50 MCG/ACT AEPB INHALE 1 PUFF INTO THE LUNGS TWICE A DAY 60 each 5   metoprolol succinate (TOPROL-XL) 25 MG 24 hr tablet Take 1 tablet (25 mg total) by mouth daily. Take with or immediately following a meal. 90 tablet 3   nitroGLYCERIN (NITROSTAT) 0.4 MG SL tablet Place 1 tablet (0.4 mg total) under the tongue every 5 (five) minutes as needed for chest pain. 20 tablet 5    omeprazole (PRILOSEC) 40 MG capsule Take 1 capsule (40 mg total) by mouth daily. 90 capsule 3   Pediatric Multiple Vitamins (FLINTSTONES MULTIVITAMIN) CHEW Chew by mouth.     potassium chloride SA (KLOR-CON M) 20 MEQ tablet Take 2 potassium tablets today and then take 1 potassium for every torsemide you take 30 tablet 3   Probiotic Product (PROBIOTIC ADVANCED) CAPS Take 1 capsule by mouth daily.     torsemide (DEMADEX) 20 MG tablet Take for weight gain of 2 lbs in 1 day or 5 lbs in 1 week or obvious swelling. (Patient taking differently: Take 20 mg by mouth once. Take for weight gain of 2 lbs in 1 day or 5 lbs in 1 week or obvious swelling.  She has been taking 1 per day per Otila Kluver with HF clinic on 11/10/22) 30 tablet 11   trolamine salicylate (ASPERCREME) 10 % cream Apply 1 application. topically daily as needed for muscle pain (back or joint pain).     vitamin B-12 (CYANOCOBALAMIN) 500 MCG tablet Take 500 mcg by mouth 4 (four) times a week. 4 times weekly no more than 2000 MCG per week     No current facility-administered medications for this visit.    Allergies as of 12/10/2022 - Review Complete 12/10/2022  Allergen Reaction Noted   Acthar hp [corticotropin] Other (See Comments) 06/28/2013   Codeine     Prednisone      ROS:  General: Negative for anorexia, weight loss, fever, chills, fatigue, weakness. ENT: Negative for hoarseness, difficulty swallowing , nasal congestion. CV: Negative for chest pain, angina, palpitations, dyspnea on exertion, peripheral edema.  Respiratory: Negative for dyspnea at rest, dyspnea on exertion, cough, sputum, wheezing.  GI: See history of present illness. GU:  Negative for dysuria, hematuria, urinary incontinence, urinary frequency, nocturnal urination.  Endo: Negative for unusual weight change.    Physical Examination:   BP 115/81   Pulse 75   Temp 98.2 F (36.8 C) (Oral)   Ht '5\' 3"'$  (1.6 m)   Wt 131 lb 4 oz (59.5 kg)   BMI 23.25 kg/m    General: Appears very thin and cachectic JVD raised to the midpoint of her neck Eyes: No icterus. Conjunctivae pink. Mouth: Oropharyngeal mucosa moist and pink , no lesions erythema or exudate. Lungs: Clear to auscultation bilaterally. Non-labored. Heart: Plan systolic murmur in the mitral area with a soft first heart sound. Abdomen: Distended, midline ventral scar.  Fluid thrill present.  No tenderness guarding or rigidity. Extremities: No lower extremity edema. No clubbing or deformities. Neuro: Alert and oriented x 3.  Grossly intact. Skin: Warm and dry, no jaundice.   Psych: Alert and cooperative, normal mood and affect.   Imaging Studies: US Paracentesis  Result Date: 11/28/2022 INDICATION: Congestive heart failure.  Refractory ascites. EXAM: ULTRASOUND-GUIDED THERAPEUTIC PARACENTESIS  COMPARISON:  IR ultrasound, 11/10/2022.  CT AP, 08/22/2022. MEDICATIONS: 5 mL lidocaine 1% COMPLICATIONS: None immediate. TECHNIQUE: Informed written consent was obtained from the patient after a discussion of the risks, benefits and alternatives to treatment. A timeout was performed prior to the initiation of the procedure. Initial ultrasound scanning demonstrates a large amount of ascites within the right lower abdominal quadrant. The right lower abdomen was prepped and draped in the usual sterile fashion. 1% lidocaine with epinephrine was used for local anesthesia. An ultrasound image was saved for documentation purposed. An 8 Fr Safe-T-Centesis catheter was introduced. The paracentesis was performed. The catheter was removed and a dressing was applied. The patient tolerated the procedure well without immediate post procedural complication. FINDINGS: A total of approximately 6.9 L of serosanguineous ascitic fluid was removed. IMPRESSION: Successful ultrasound-guided therapeutic paracentesis yielding 6.9 L of peritoneal fluid. Michaelle Birks, MD [facility]  Radiology Electronically Signed   By: Michaelle Birks M.D.   On: 11/28/2022 16:52    Assessment and Plan:   Anita [name] is a 62 y.o. y/o female who is here today to see me for evaluation of ascites.  She is collect that she has had 2 rounds of paracentesis with over 13 L of fluid taken out but unfortunately I cannot see any labs being checked on the ascites to determine the etiology of the same.  She has severe mitral regurgitation and on examination has a raised JVD and her echocardiogram shows tricuspid regurgitation that is severe.  She is not known to have liver disease.  I suspect she may have ascites related to right heart failure secondary to severe mitral regurgitation.  Plan 1.  Diagnostic abdominal paracentesis to determine etiology of the ascites based on analysis of the fluid 2.  Would suggest to address elevated TSH as hypothyroidism can also contribute to ascites and worsening of cardiac function 3.  Right upper quadrant ultrasound to evaluate liver as well as Doppler ultrasound to rule out portal vein thrombosis.  4.  At next visit based on her chemistry and ascites evaluation will be better able to tell etiology of her fluid. 5.  CBC, CMP, INR   Dr Anita Bellows  MD,MRCP [facility]) Follow up in 2 weeks

## 2022-12-10 NOTE — Addendum Note (Signed)
Addended by: Wayna Chalet on: 12/10/2022 03:01 PM   Modules accepted: Orders

## 2022-12-11 LAB — CBC WITH DIFFERENTIAL/PLATELET
Basophils Absolute: 0.1 10*3/uL (ref 0.0–0.2)
Basos: 1 %
EOS (ABSOLUTE): 0.1 10*3/uL (ref 0.0–0.4)
Eos: 1 %
Hematocrit: 30 % — ABNORMAL LOW (ref 34.0–46.6)
Hemoglobin: 9.1 g/dL — ABNORMAL LOW (ref 11.1–15.9)
Immature Grans (Abs): 0 10*3/uL (ref 0.0–0.1)
Immature Granulocytes: 0 %
Lymphocytes Absolute: 0.4 10*3/uL — ABNORMAL LOW (ref 0.7–3.1)
Lymphs: 6 %
MCH: 28.7 pg (ref 26.6–33.0)
MCHC: 30.3 g/dL — ABNORMAL LOW (ref 31.5–35.7)
MCV: 95 fL (ref 79–97)
Monocytes Absolute: 0.8 10*3/uL (ref 0.1–0.9)
Monocytes: 11 %
Neutrophils Absolute: 5.9 10*3/uL (ref 1.4–7.0)
Neutrophils: 81 %
Platelets: 265 10*3/uL (ref 150–450)
RBC: 3.17 x10E6/uL — ABNORMAL LOW (ref 3.77–5.28)
RDW: 16.2 % — ABNORMAL HIGH (ref 11.7–15.4)
WBC: 7.3 10*3/uL (ref 3.4–10.8)

## 2022-12-11 LAB — COMPREHENSIVE METABOLIC PANEL
ALT: 9 IU/L (ref 0–32)
AST: 14 IU/L (ref 0–40)
Albumin/Globulin Ratio: 1.3 (ref 1.2–2.2)
Albumin: 3.4 g/dL — ABNORMAL LOW (ref 3.8–4.9)
Alkaline Phosphatase: 124 IU/L — ABNORMAL HIGH (ref 44–121)
BUN/Creatinine Ratio: 17 (ref 9–23)
BUN: 28 mg/dL — ABNORMAL HIGH (ref 6–24)
Bilirubin Total: 0.4 mg/dL (ref 0.0–1.2)
CO2: 23 mmol/L (ref 20–29)
Calcium: 8.6 mg/dL — ABNORMAL LOW (ref 8.7–10.2)
Chloride: 104 mmol/L (ref 96–106)
Creatinine, Ser: 1.69 mg/dL — ABNORMAL HIGH (ref 0.57–1.00)
Globulin, Total: 2.6 g/dL (ref 1.5–4.5)
Glucose: 77 mg/dL (ref 70–99)
Potassium: 4.2 mmol/L (ref 3.5–5.2)
Sodium: 142 mmol/L (ref 134–144)
Total Protein: 6 g/dL (ref 6.0–8.5)
eGFR: 35 mL/min/{1.73_m2} — ABNORMAL LOW (ref >59)

## 2022-12-11 LAB — PROTIME-INR
INR: 1.1 (ref 0.9–1.2)
Prothrombin Time: 11.7 s (ref 9.1–12.0)

## 2022-12-11 MED ORDER — ALBUMIN HUMAN 25 % IV SOLN: 25.0000 g | Freq: Once | INTRAVENOUS | Status: DC

## 2022-12-11 NOTE — Addendum Note (Signed)
Addended by: Wayna Chalet on: 12/11/2022 04:46 PM   Modules accepted: Orders

## 2022-12-15 ENCOUNTER — Ambulatory Visit
Admission: RE | Admit: 2022-12-15 | Discharge: 2022-12-15 | Disposition: A | Discharge status: Home / Self Care | Payer: Medicare Other | Source: Ambulatory Visit | Attending: Gastroenterology | Admitting: Gastroenterology

## 2022-12-15 DIAGNOSIS — R188 Other ascites: Secondary | ICD-10-CM | POA: Diagnosis not present

## 2022-12-15 DIAGNOSIS — R748 Abnormal levels of other serum enzymes: Secondary | ICD-10-CM | POA: Diagnosis not present

## 2022-12-15 DIAGNOSIS — R7989 Other specified abnormal findings of blood chemistry: Secondary | ICD-10-CM | POA: Diagnosis not present

## 2022-12-15 LAB — LACTATE DEHYDROGENASE, PLEURAL OR PERITONEAL FLUID: LD, Fluid: 72 U/L — ABNORMAL HIGH (ref 3–23)

## 2022-12-15 LAB — PROTEIN, PLEURAL OR PERITONEAL FLUID: Total protein, fluid: 3 g/dL

## 2022-12-15 LAB — BODY FLUID CELL COUNT WITH DIFFERENTIAL
Eos, Fluid: 0 %
Lymphs, Fluid: 23 %
Monocyte-Macrophage-Serous Fluid: 31 % — ABNORMAL LOW (ref 50–90)
Neutrophil Count, Fluid: 46 % — ABNORMAL HIGH (ref 0–25)
Total Nucleated Cell Count, Fluid: 218 cu mm (ref 0–1000)

## 2022-12-15 LAB — PATHOLOGIST SMEAR REVIEW

## 2022-12-15 LAB — AMYLASE, PLEURAL OR PERITONEAL FLUID: Amylase, Fluid: 33 U/L

## 2022-12-15 LAB — GLUCOSE, PLEURAL OR PERITONEAL FLUID: Glucose, Fluid: 96 mg/dL

## 2022-12-15 LAB — ALBUMIN, PLEURAL OR PERITONEAL FLUID: Albumin, Fluid: <1.5 g/dL

## 2022-12-15 MED ORDER — ALBUMIN HUMAN 25 % IV SOLN: 25.0000 g | Freq: Once | INTRAVENOUS | Status: AC

## 2022-12-15 MED ORDER — LIDOCAINE HCL (PF) 1 % IJ SOLN
10.0000 mL | Freq: Once | INTRAMUSCULAR | Status: AC
  Administered 2022-12-15: 10 mL via INTRADERMAL

## 2022-12-15 MED ORDER — ALBUMIN HUMAN 25 % IV SOLN
INTRAVENOUS | Status: AC
  Administered 2022-12-15: 25 g via INTRAVENOUS
  Filled 2022-12-15: qty 100

## 2022-12-15 NOTE — Discharge Instructions (Signed)
Instructions reviewed with patient.

## 2022-12-15 NOTE — Procedures (Signed)
PROCEDURE SUMMARY:  Successful image-guided paracentesis from the left lower abdomen.  Yielded 5.3 liters of red-tinged yellow fluid.  No immediate complications.  EBL = trace. Patient tolerated well.   Specimen was sent for labs.  Please see imaging section of Epic for full dictation.   Lura Em PA-C 12/15/2022 12:10 PM

## 2022-12-16 LAB — TRIGLYCERIDES, BODY FLUIDS: Triglycerides, Fluid: 23 mg/dL

## 2022-12-17 ENCOUNTER — Ambulatory Visit: Payer: Medicare Other

## 2022-12-17 ENCOUNTER — Ambulatory Visit: Admission: RE | Admit: 2022-12-17 | Payer: Medicare Other | Source: Ambulatory Visit

## 2022-12-17 DIAGNOSIS — I471 Supraventricular tachycardia, unspecified: Secondary | ICD-10-CM | POA: Diagnosis not present

## 2022-12-17 DIAGNOSIS — I420 Dilated cardiomyopathy: Secondary | ICD-10-CM | POA: Diagnosis not present

## 2022-12-17 DIAGNOSIS — I130 Hypertensive heart and chronic kidney disease with heart failure and stage 1 through stage 4 chronic kidney disease, or unspecified chronic kidney disease: Secondary | ICD-10-CM | POA: Diagnosis not present

## 2022-12-17 DIAGNOSIS — I5043 Acute on chronic combined systolic (congestive) and diastolic (congestive) heart failure: Secondary | ICD-10-CM | POA: Diagnosis not present

## 2022-12-17 DIAGNOSIS — N1832 Chronic kidney disease, stage 3b: Secondary | ICD-10-CM | POA: Diagnosis not present

## 2022-12-17 DIAGNOSIS — K7460 Unspecified cirrhosis of liver: Secondary | ICD-10-CM | POA: Diagnosis not present

## 2022-12-18 ENCOUNTER — Telehealth: Payer: Self-pay

## 2022-12-18 LAB — BODY FLUID CULTURE W GRAM STAIN
Culture: NO GROWTH
Gram Stain: NONE SEEN

## 2022-12-18 LAB — LIPASE, FLUID: Lipase-Fluid: 15 U/L

## 2022-12-18 NOTE — Telephone Encounter (Signed)
Pt's daughter called. She reports that the message we received from Tammy with home health, reporting 2 pound weight gain is not accurate. She states home health has orders to report any weight over 128 lb, but patient's weight was 128.2 the morning after paracentesis this week, at baseline. She states the patient feels good, and has no worsening symptoms.  [name], FNP reccomends that if patient has 2 + pound weight gain and worsening SOB, she can take 1 extra torsemide tablet with 1 potassium tablet for 2 days and report back to clinic. Provider also recc follow up with [facility] MD for her valvular disease. Patient's daughter she states they will hold off on extra torsemide for now, and states they will wait to follow up with advanced HF providers until after they see GI doctor next week.  She had no further questions or concerns at this time. Georg Ruddle, RN

## 2022-12-19 LAB — TOTAL BILIRUBIN, BODY FLUID: Total bilirubin, fluid: 0.2 mg/dL

## 2022-12-25 ENCOUNTER — Ambulatory Visit (INDEPENDENT_AMBULATORY_CARE_PROVIDER_SITE_OTHER): Payer: Medicare Other | Admitting: Gastroenterology

## 2022-12-25 VITALS — BP 124/77 | HR 75 | Temp 98.1°F | Ht 63.0 in | Wt 145.0 lb

## 2022-12-25 DIAGNOSIS — K7469 Other cirrhosis of liver: Secondary | ICD-10-CM

## 2022-12-25 DIAGNOSIS — I340 Nonrheumatic mitral (valve) insufficiency: Secondary | ICD-10-CM | POA: Diagnosis not present

## 2022-12-25 DIAGNOSIS — R188 Other ascites: Secondary | ICD-10-CM | POA: Diagnosis not present

## 2022-12-25 LAB — MISC LABCORP TEST (SEND OUT)
LabCorp test name: 5367
Labcorp test code: 9985

## 2022-12-25 NOTE — Progress Notes (Signed)
[name] MD, MRCP(U.K) [address]  [place]  [place], [place] [address]  Main: [phone]  Fax: [phone]   Primary Care Physician: [name], NP  Primary Gastroenterologist:  Dr. [name]   Chief Complaint  Patient presents with   Follow-up    HPI: Anita [name] is a 62 y.o. female    Summary of history : Initially referred and seen in July 2021 for normocytic anemia and rectal bleeding likely due to a mutation from iron deficiency as well as CKD, B12 deficiency.   She also has a history of multiple sclerosis.Seen and evaluated by cardiology.  Status post cardiac catheterization.  RCA 100% stenosed.   05/03/2020: Hemoglobin 8.9 g MCV of 82.9.  Hemoglobin was 9.2 g 3 months prior.  MCV at that time was 94.8. Creatinine of 1.51  07/02/2020: Iron studies showed a normal ferritin of 91 TIBC was not elevated.,  Celiac serology was negative.  Could not obtain a B12 level due to insurance issues.  Vitamin C levels were normal. 08/03/2020 underwent a colonoscopy.  Diverticulosis of the sigmoid colon was noted and nonbleeding internal hemorrhoids were also noted which were large in nature but nonbleeding.  5 mm polyp was resected in the transverse colon with a cold snare.  It was a tubular adenoma.   01/08/2021 seen by Dr. Tasia Catchings as an anemia due to a combination of iron deficiency as well as chronic kidney disease patient plan was to continue B12 and iron supplementation   In February 2022 seen by cardiology for shortness of breath which was felt to be likely multifactorial from cardiomyopathy and known mitral regurgitation which is moderate to severe, COPD   In 2022 her dysphagia was better after starting Prilosec.  Barium swallow showed no abnormality. 10/23/2022 TSH elevated at 6.4 Free T4 T3 normal.  BNP elevated at 2500.  Creatinine 1.73 hepatic function panel shows albumin of 3.3 alkaline phosphatase 110 total bilirubin of 1.1     Interval history   12/10/2022-12/25/2022 12/15/2022 ultrasound of the abdomen with Doppler shows cirrhosis no evidence of portal vein thrombosis. 12/10/2022: Hemoglobin 9.1 g MCV 28 platelet count 265, INR 1.1, albumin 3.4 alkaline phosphatase 124 AST 14 ALT 9 total bilirubin 0.4 creatinine 1.69.  12/15/2022: Ascetic fluid total cell count 218 46% neutrophils.  Triglycerides 23 LDH 72, total protein 3.0, albumin less than 1.5  SAAG greater than 1.5.   She is unable to lay flat get short of breath when she lays flat.  She is aware that her thyroid hormone levels are abnormal    Active medications include amiodarone, torsemide   She says that she is here to see me today because she has had 2 rounds of paracentesis and over 12 L of fluid has been taken out.  She says that cardiology asked her to come and see [place] to find out if it is coming out from her liver.  She feels short of breath when she lays flat.  Unfortunately I cannot see any labs of the ascites fluid that have been checked.    Current Outpatient Medications  Medication Sig Dispense Refill   albuterol (VENTOLIN HFA) 108 (90 Base) MCG/ACT inhaler INHALE 1-2 PUFFS INTO THE LUNGS EVERY 6 (SIX) HOURS AS NEEDED FOR WHEEZING OR SHORTNESS OF BREATH. USE SPARINGLY! 18 each 0   amiodarone (PACERONE) 200 MG tablet Take 1 tablet (200 mg total) by mouth 2 (two) times daily. 180 tablet 3   aspirin EC 81 MG  tablet Take 1 tablet (81 mg total) by mouth daily. Swallow whole. 30 tablet 0   atorvastatin (LIPITOR) 20 MG tablet Take 1 tablet (20 mg total) by mouth at bedtime. 90 tablet 1   DULoxetine (CYMBALTA) 20 MG capsule TAKE 1 CAPSULE (20 MG TOTAL) BY MOUTH DAILY. FOR ANXIETY AND PAIN. 90 capsule 2   feeding supplement, ENSURE ENLIVE, (ENSURE ENLIVE) LIQD Take 237 mLs by mouth 2 (two) times daily between meals.     ferrous sulfate 325 (65 FE) MG EC tablet Take 1 tablet (325 mg total) by mouth daily. 60 tablet 3   fluticasone-salmeterol (ADVAIR DISKUS) 250-50 MCG/ACT AEPB  INHALE 1 PUFF INTO THE LUNGS TWICE A DAY 60 each 5   metoprolol succinate (TOPROL-XL) 25 MG 24 hr tablet Take 1 tablet (25 mg total) by mouth daily. Take with or immediately following a meal. 90 tablet 3   nitroGLYCERIN (NITROSTAT) 0.4 MG SL tablet Place 1 tablet (0.4 mg total) under the tongue every 5 (five) minutes as needed for chest pain. 20 tablet 5   omeprazole (PRILOSEC) 40 MG capsule Take 1 capsule (40 mg total) by mouth daily. 90 capsule 3   Pediatric Multiple Vitamins (FLINTSTONES MULTIVITAMIN) CHEW Chew by mouth.     potassium chloride SA (KLOR-CON M) 20 MEQ tablet Take 2 potassium tablets today and then take 1 potassium for every torsemide you take 30 tablet 3   Probiotic Product (PROBIOTIC ADVANCED) CAPS Take 1 capsule by mouth daily.     torsemide (DEMADEX) 20 MG tablet Take for weight gain of 2 lbs in 1 day or 5 lbs in 1 week or obvious swelling. (Patient taking differently: Take 20 mg by mouth once. Take for weight gain of 2 lbs in 1 day or 5 lbs in 1 week or obvious swelling.  She has been taking 1 per day per Otila Kluver with HF clinic on 11/10/22) 30 tablet 11   trolamine salicylate (ASPERCREME) 10 % cream Apply 1 application. topically daily as needed for muscle pain (back or joint pain).     vitamin B-12 (CYANOCOBALAMIN) 500 MCG tablet Take 500 mcg by mouth 4 (four) times a week. 4 times weekly no more than 2000 MCG per week     Current Facility-Administered Medications  Medication Dose Route Frequency Provider Last Rate Last Admin   albumin human 25 % solution 25 g  25 g Intravenous Once [name], MD        Allergies as of 12/25/2022 - Review Complete 12/25/2022  Allergen Reaction Noted   Acthar hp [corticotropin] Other (See Comments) 06/28/2013   Codeine     Prednisone      ROS:  General: Negative for anorexia, weight loss, fever, chills, fatigue, weakness. ENT: Negative for hoarseness, difficulty swallowing , nasal congestion. CV: Negative for chest pain, angina,  palpitations, dyspnea on exertion, peripheral edema.  Respiratory: Negative for dyspnea at rest, dyspnea on exertion, cough, sputum, wheezing.  GI: See history of present illness. GU:  Negative for dysuria, hematuria, urinary incontinence, urinary frequency, nocturnal urination.  Endo: Negative for unusual weight change.    Physical Examination:   BP 124/77   Pulse 75   Temp 98.1 F (36.7 C) (Oral)   Ht '5\' 3"'$  (1.6 m)   Wt 145 lb (65.8 kg)   BMI 25.69 kg/m   General: Very thin pale appearing and cachectic unable to lay flat sitting in the wheelchair just Eyes: No icterus. Conjunctivae pink. Mouth: Oropharyngeal mucosa moist and pink , no lesions  erythema or exudate. Lungs: Clear to auscultation bilaterally. Non-labored. Heart: Solik murmur in the mitral area, soft S1 normal S2 Abdomen: Distended, free fluid present.  No tenderness no guarding rigidity Extremities: No lower extremity edema. No clubbing or deformities. Neuro: Alert and oriented x 3.  Grossly intact. Skin: Warm and dry, no jaundice.   Psych: Alert and cooperative, normal mood and affect.   Imaging Studies: US ABDOMEN LIMITED WITH LIVER DOPPLER  Result Date: 12/15/2022 CLINICAL DATA:  Ascites and abnormal liver function tests. EXAM: DUPLEX ULTRASOUND OF LIVER TECHNIQUE: Color and duplex Doppler ultrasound was performed to evaluate the hepatic in-flow and out-flow vessels. COMPARISON:  CT of the abdomen and pelvis on 08/22/2022 FINDINGS: Portal Vein Velocities Main:  38-43 cm/sec Right:  41 cm/sec Left:  42 cm/sec Hepatic Vein Velocities Right:  65 cm/sec Middle:  71 cm/sec Left:  58 cm/sec Hepatic Artery Velocity:  48 cm/sec Splenic Vein Velocity:  27 cm/sec Varices: None visualized. Ascites: Ascites present adjacent to the liver. Portal vein velocities are normal and direction of flow is towards the liver. There is no evidence of portal vein thrombus. Portal vein waveforms are unremarkable. Hepatic veins demonstrate normal  patency and waveforms within the liver. The spleen is normal in size. Slight nodularity of the surface contour of the liver is suggestive of some degree of underlying cirrhosis. IMPRESSION: 1. No evidence of portal vein thrombosis or significant decrease in portal vein velocities to suggest significant underlying portal hypertension. 2. Ascites. 3. Nodular surface contour of the liver is suggestive of some degree of underlying cirrhosis. Electronically Signed   By: Aletta Edouard M.D.   On: 12/15/2022 13:10   US Paracentesis  Result Date: 12/15/2022 INDICATION: Ascites, abnormal liver enzymes EXAM: ULTRASOUND GUIDED DIAGNOSTIC AND THERAPEUTIC LEFT LOWER QUADRANT PARACENTESIS MEDICATIONS: None. 10 cc 1% lidocaine COMPLICATIONS: None immediate. PROCEDURE: Informed written consent was obtained from the patient after a discussion of the risks, benefits and alternatives to treatment. A timeout was performed prior to the initiation of the procedure. Initial ultrasound scanning demonstrates a large amount of ascites within the left lower abdominal quadrant. The left lower abdomen was prepped and draped in the usual sterile fashion. 1% lidocaine was used for local anesthesia. Following this, a 19 gauge, 7-cm, Yueh catheter was introduced. An ultrasound image was saved for documentation purposes. The paracentesis was performed. The catheter was removed and a dressing was applied. The patient tolerated the procedure well without immediate post procedural complication. Patient received post-procedure intravenous albumin; see nursing notes for details. FINDINGS: A total of approximately 5.3 L of red-tinged yellow fluid was removed. Samples were sent to the laboratory as requested by the clinical team. IMPRESSION: Successful ultrasound-guided paracentesis yielding 5.3 liters of peritoneal fluid. Read by: Reatha Armour, PA-C Electronically Signed   By: Markus Daft M.D.   On: 12/15/2022 12:51   US Paracentesis  Result Date:  11/28/2022 INDICATION: Congestive heart failure.  Refractory ascites. EXAM: ULTRASOUND-GUIDED THERAPEUTIC PARACENTESIS COMPARISON:  IR ultrasound, 11/10/2022.  CT AP, 08/22/2022. MEDICATIONS: 5 mL lidocaine 1% COMPLICATIONS: None immediate. TECHNIQUE: Informed written consent was obtained from the patient after a discussion of the risks, benefits and alternatives to treatment. A timeout was performed prior to the initiation of the procedure. Initial ultrasound scanning demonstrates a large amount of ascites within the right lower abdominal quadrant. The right lower abdomen was prepped and draped in the usual sterile fashion. 1% lidocaine with epinephrine was used for local anesthesia. An ultrasound image was saved for documentation  purposed. An 8 Fr Safe-T-Centesis catheter was introduced. The paracentesis was performed. The catheter was removed and a dressing was applied. The patient tolerated the procedure well without immediate post procedural complication. FINDINGS: A total of approximately 6.9 L of serosanguineous ascitic fluid was removed. IMPRESSION: Successful ultrasound-guided therapeutic paracentesis yielding 6.9 L of peritoneal fluid. Michaelle Birks, MD [facility] Electronically Signed   By: Michaelle Birks M.D.   On: 11/28/2022 16:52    Assessment and Plan:   JAINA MORIN is a 62 y.o. y/o female  is here today to see me for evaluation of ascites.  She is collect that she has had 2 rounds of paracentesis with over 13 L of fluid taken out but unfortunately I cannot see any labs being checked on the ascites to determine the etiology of the same.  She has severe mitral regurgitation and on examination has a raised JVD and her echocardiogram shows tricuspid regurgitation that is severe.  She is not known to have liver disease. She may have ascites related to right heart failure secondary to severe mitral regurgitation.   As per the  algorithm to evaluate ascites, with SAAG G greater than 1.1 and total protein in the fluid greater than 2.5 etiology is suggestive of cardiac ascites.  References below and from up-to-date algorithm pasted below for evaluation of ascites.   Plan 1.  The ascites is driven from the cardiac etiology.  I.e. cardiac cirrhosis would recommend management via cardiology to control the right heart pressures as well as ascites. 2.  TSH is elevated with normal T4 T3 follow-up with [name], NP  3.   The patient does have liver cirrhosis, will need screening for [facility] every 6 months with imaging and AFP.  Next due in July 2024 4.  Will order all autoimmune and viral hepatitis labs to evaluate for any other causes for liver cirrhosis. 5.  Will require screening for esophageal varices at the time when she is able to lay flat which is not presently possible as she gets short of breath when laying flat.  We will reassess her at next visit.  With the platelet count at 265 it does suggest that the likelihood of esophageal varices is very low.   Pathophysiology/Diagnosis/Management, 7th Edition, Dewain Penning, Scharschmidt BF, Sleisenger MH Macy Mis), WB [employer] 2002. Copyright  2002 WB [facility]     Dr [name]  MD,MRCP [facility]) Follow up in 6 months

## 2022-12-28 LAB — HEPATITIS B SURFACE ANTIBODY,QUALITATIVE: Hep B Surface Ab, Qual: NONREACTIVE

## 2022-12-28 LAB — HEPATITIS B E ANTIGEN: Hep B E Ag: NEGATIVE

## 2022-12-28 LAB — IRON,TIBC AND FERRITIN PANEL
Ferritin: 152 ng/mL — ABNORMAL HIGH (ref 15–150)
Iron Saturation: 14 % — ABNORMAL LOW (ref 15–55)
Iron: 39 ug/dL (ref 27–159)
Total Iron Binding Capacity: 288 ug/dL (ref 250–450)
UIBC: 249 ug/dL (ref 131–425)

## 2022-12-28 LAB — HEPATITIS A ANTIBODY, TOTAL: hep A Total Ab: NEGATIVE

## 2022-12-28 LAB — IMMUNOGLOBULINS A/E/G/M, SERUM
IgA/Immunoglobulin A, Serum: 327 mg/dL (ref 87–352)
IgE (Immunoglobulin E), Serum: 12 IU/mL (ref 6–495)
IgG (Immunoglobin G), Serum: 982 mg/dL (ref 586–1602)
IgM (Immunoglobulin M), Srm: 126 mg/dL (ref 26–217)

## 2022-12-28 LAB — ANA: Anti Nuclear Antibody (ANA): NEGATIVE

## 2022-12-28 LAB — ANTI-MICROSOMAL ANTIBODY LIVER / KIDNEY: LKM1 Ab: 0.8 Units (ref 0.0–20.0)

## 2022-12-28 LAB — CERULOPLASMIN: Ceruloplasmin: 29.6 mg/dL (ref 19.0–39.0)

## 2022-12-28 LAB — HEPATITIS B SURFACE ANTIGEN: Hepatitis B Surface Ag: NEGATIVE

## 2022-12-28 LAB — CELIAC DISEASE AB SCREEN W/RFX
Antigliadin Abs, IgA: 4 units (ref 0–19)
Transglutaminase IgA: <2 U/mL (ref 0–3)

## 2022-12-28 LAB — MITOCHONDRIAL/SMOOTH MUSCLE AB PNL
Mitochondrial Ab: <20 Units (ref 0.0–20.0)
Smooth Muscle Ab: 3 Units (ref 0–19)

## 2022-12-28 LAB — HEPATITIS B E ANTIBODY: Hep B E Ab: NEGATIVE

## 2022-12-28 LAB — HEPATITIS B CORE ANTIBODY, TOTAL: Hep B Core Total Ab: NEGATIVE

## 2022-12-28 LAB — CK: Total CK: 73 U/L (ref 32–182)

## 2022-12-28 LAB — ALPHA-1-ANTITRYPSIN: A-1 Antitrypsin: 282 mg/dL — ABNORMAL HIGH (ref 101–187)

## 2022-12-28 LAB — HEPATITIS C ANTIBODY: Hep C Virus Ab: NONREACTIVE

## 2022-12-29 DIAGNOSIS — I130 Hypertensive heart and chronic kidney disease with heart failure and stage 1 through stage 4 chronic kidney disease, or unspecified chronic kidney disease: Secondary | ICD-10-CM | POA: Diagnosis not present

## 2022-12-29 DIAGNOSIS — N1832 Chronic kidney disease, stage 3b: Secondary | ICD-10-CM | POA: Diagnosis not present

## 2022-12-29 DIAGNOSIS — K7460 Unspecified cirrhosis of liver: Secondary | ICD-10-CM | POA: Diagnosis not present

## 2022-12-29 DIAGNOSIS — I471 Supraventricular tachycardia, unspecified: Secondary | ICD-10-CM | POA: Diagnosis not present

## 2022-12-29 DIAGNOSIS — I420 Dilated cardiomyopathy: Secondary | ICD-10-CM | POA: Diagnosis not present

## 2022-12-29 DIAGNOSIS — I5043 Acute on chronic combined systolic (congestive) and diastolic (congestive) heart failure: Secondary | ICD-10-CM | POA: Diagnosis not present

## 2022-12-29 NOTE — Progress Notes (Signed)
Needs hep A/ B vaccine

## 2023-01-01 ENCOUNTER — Other Ambulatory Visit
Admission: RE | Admit: 2023-01-01 | Discharge: 2023-01-01 | Disposition: A | Discharge status: Home / Self Care | Payer: Medicare Other | Source: Ambulatory Visit | Attending: Family | Admitting: Family

## 2023-01-01 ENCOUNTER — Ambulatory Visit (HOSPITAL_BASED_OUTPATIENT_CLINIC_OR_DEPARTMENT_OTHER): Payer: Medicare Other | Admitting: Cardiology

## 2023-01-01 ENCOUNTER — Ambulatory Visit: Payer: Medicare Other | Admitting: Cardiology

## 2023-01-01 ENCOUNTER — Encounter: Payer: Self-pay | Admitting: Cardiology

## 2023-01-01 VITALS — BP 135/79 | HR 76 | Resp 18 | Wt 156.0 lb

## 2023-01-01 DIAGNOSIS — I340 Nonrheumatic mitral (valve) insufficiency: Secondary | ICD-10-CM

## 2023-01-01 DIAGNOSIS — I5022 Chronic systolic (congestive) heart failure: Secondary | ICD-10-CM

## 2023-01-01 DIAGNOSIS — I255 Ischemic cardiomyopathy: Secondary | ICD-10-CM | POA: Diagnosis not present

## 2023-01-01 DIAGNOSIS — I471 Supraventricular tachycardia, unspecified: Secondary | ICD-10-CM | POA: Diagnosis not present

## 2023-01-01 DIAGNOSIS — Z01810 Encounter for preprocedural cardiovascular examination: Secondary | ICD-10-CM | POA: Diagnosis not present

## 2023-01-01 DIAGNOSIS — R188 Other ascites: Secondary | ICD-10-CM

## 2023-01-01 DIAGNOSIS — I5042 Chronic combined systolic (congestive) and diastolic (congestive) heart failure: Secondary | ICD-10-CM | POA: Insufficient documentation

## 2023-01-01 LAB — LIPID PANEL
Cholesterol: 118 mg/dL (ref 0–200)
HDL: 53 mg/dL (ref >40)
LDL Cholesterol: 56 mg/dL (ref 0–99)
Total CHOL/HDL Ratio: 2.2 RATIO
Triglycerides: 45 mg/dL (ref <150)
VLDL: 9 mg/dL (ref 0–40)

## 2023-01-01 LAB — BASIC METABOLIC PANEL
Anion gap: 11 (ref 5–15)
BUN: 29 mg/dL — ABNORMAL HIGH (ref 6–20)
CO2: 20 mmol/L — ABNORMAL LOW (ref 22–32)
Calcium: 8.4 mg/dL — ABNORMAL LOW (ref 8.9–10.3)
Chloride: 106 mmol/L (ref 98–111)
Creatinine, Ser: 1.67 mg/dL — ABNORMAL HIGH (ref 0.44–1.00)
GFR, Estimated: 35 mL/min — ABNORMAL LOW (ref >60)
Glucose, Bld: 87 mg/dL (ref 70–99)
Potassium: 3.8 mmol/L (ref 3.5–5.1)
Sodium: 137 mmol/L (ref 135–145)

## 2023-01-01 LAB — COMPREHENSIVE METABOLIC PANEL
ALT: 16 U/L (ref 0–44)
AST: 23 U/L (ref 15–41)
Albumin: 3 g/dL — ABNORMAL LOW (ref 3.5–5.0)
Alkaline Phosphatase: 108 U/L (ref 38–126)
Anion gap: 10 (ref 5–15)
BUN: 28 mg/dL — ABNORMAL HIGH (ref 6–20)
CO2: 21 mmol/L — ABNORMAL LOW (ref 22–32)
Calcium: 8.4 mg/dL — ABNORMAL LOW (ref 8.9–10.3)
Chloride: 105 mmol/L (ref 98–111)
Creatinine, Ser: 1.68 mg/dL — ABNORMAL HIGH (ref 0.44–1.00)
GFR, Estimated: 35 mL/min — ABNORMAL LOW (ref >60)
Glucose, Bld: 86 mg/dL (ref 70–99)
Potassium: 3.8 mmol/L (ref 3.5–5.1)
Sodium: 136 mmol/L (ref 135–145)
Total Bilirubin: 0.8 mg/dL (ref 0.3–1.2)
Total Protein: 6.9 g/dL (ref 6.5–8.1)

## 2023-01-01 LAB — BRAIN NATRIURETIC PEPTIDE: B Natriuretic Peptide: 2718.4 pg/mL — ABNORMAL HIGH (ref 0.0–100.0)

## 2023-01-01 LAB — TSH: TSH: 12.097 u[IU]/mL — ABNORMAL HIGH (ref 0.350–4.500)

## 2023-01-01 MED ORDER — SPIRONOLACTONE 25 MG PO TABS: 25.0000 mg | ORAL_TABLET | Freq: Every day | ORAL | 3 refills | Status: DC

## 2023-01-01 MED ORDER — TORSEMIDE 20 MG PO TABS: ORAL_TABLET | ORAL | 11 refills | Status: DC

## 2023-01-01 NOTE — Progress Notes (Signed)
PCP: Anita Koch, NP HF Cardiology: Dr. Aundra [name]  62 y.o. with history of multiple sclerosis, CAD, chronic systolic CHF, congestive hepatopathy and valvular heart disease was referred by Anita [name] for CHF evaluation.  Last echo in 9/23 showed EF 30-35%, mild LVH, severe RV dysfunction with moderate RV enlargement, severe MR, severe TR.  She has been struggling with volume overload including cardiogenic ascites for months. She has multiple sclerosis, secondary progressive. She walks with a rolling walker and has significant left leg weakness.  She has slurred speech and some visual difficulty.  She was deemed not a surgical candidate for her valves, and she was thought to be too high risk for TEE.  Her last cath was in 4/21 and showed occluded RHC with collaterals.  There was moderate disease in the LAD and LCx not thought to be hemodynamically significant, medically managed. She has had several paracenteses over the last couple of months for ascites.  She has cirrhosis likely secondary to RV failure. Most recent admission was in 10/23 with SVT requiring adenosine and volume overload treated with Lasix gtt.   Patient reports abdominal tightness today, has been about 2 wks since last paracentesis. She walks with walker as above due to left leg weakness.  She has significant orthopnea and sleeps propped up.  She is short of breath walking around her house.  Some lightheadedness if she stands too fast but no falls or syncope.  No chest pain.  She has been taking torsemide 20 mg once daily.   ECG (personally reviewed): NSR, RVH  Labs (1/24): K 4.2, creatinine 1.69, AST/ALT/bilirubin normal.  PMH: 1. CAD: LHC in 4/21 with 60% mLAD, 70% D1, 65% mLCx, occluded proximal RCA with collaterals. Medically managed.  2. COPD 3. H/o SVT 4. Multiple sclerosis: Secondary progressive.  5. Anemia of CKD 6. CKD stage 3 7. B12 deficiency.  8. Chronic systolic CHF: Mixed ischemic and valvular cardiomyopathy.    - Echo (9/23): EF 30-35%, mild LVH, severe RV dysfunction with moderate RV enlargement, severe MR, severe TR.  9. Valvular heart disease: Severe MR and severe TR on 9/23 echo. Not candidate for surgical valve replacement.  10. Cirrhosis: Suspect due to RV failure/hepatic congestion.  - H/o paracenteses.   Social History   Socioeconomic History   Marital status: Divorced    Spouse name: Not on file   Number of children: 2   Years of education: Not on file   Highest education level: Not on file  Occupational History   Not on file  Tobacco Use   Smoking status: Former    Packs/day: 1.00    Types: Cigarettes   Smokeless tobacco: Never  Vaping Use   Vaping Use: Never used  Substance and Sexual Activity   Alcohol use: Yes    Comment: Occasional   Drug use: Never   Sexual activity: Not on file  Other Topics Concern   Not on file  Social History Narrative   Right handed    Lives in one story home   Drinks Caffeine - Sweet Tea    Social Determinants of Health   Financial Resource Strain: Low Risk  (07/08/2022)   Overall Financial Resource Strain (CARDIA)    Difficulty of Paying Living Expenses: Not hard at all  Food Insecurity: No Food Insecurity (09/17/2022)   Hunger Vital Sign    Worried About Running Out of Food in the Last Year: Never true    Ran Out of Food in the Last Year: Never true  Transportation Needs: No Transportation Needs (09/17/2022)   PRAPARE - Hydrologist (Medical): No    Lack of Transportation (Non-Medical): No  Physical Activity: Inactive (07/08/2022)   Exercise Vital Sign    Days of Exercise per Week: 0 days    Minutes of Exercise per Session: 0 min  Stress: No Stress Concern Present (07/08/2022)   [name]    Feeling of Stress : Not at all  Social Connections: Socially Isolated (07/08/2022)   Social Connection and Isolation Panel [NHANES]    Frequency of  Communication with Friends and Family: Three times a week    Frequency of Social Gatherings with Friends and Family: Never    Attends Religious Services: Never    Marine scientist or Organizations: No    Attends Archivist Meetings: Never    Marital Status: Divorced  Human resources officer Violence: Not At Risk (09/17/2022)   Humiliation, Afraid, Rape, and Kick questionnaire    Fear of Current or Ex-Partner: No    Emotionally Abused: No    Physically Abused: No    Sexually Abused: No   Family History  Problem Relation Age of Onset   Skin cancer Mother    Lung cancer Father    Uterine cancer Sister    Heart disease Brother    Bipolar disorder Sister    Throat cancer Paternal Grandmother    ROS: All systems reviewed and negative except as per HPI.   Current Outpatient Medications  Medication Sig Dispense Refill   albuterol (VENTOLIN HFA) 108 (90 Base) MCG/ACT inhaler INHALE 1-2 PUFFS INTO THE LUNGS EVERY 6 (SIX) HOURS AS NEEDED FOR WHEEZING OR SHORTNESS OF BREATH. USE SPARINGLY! 18 each 0   amiodarone (PACERONE) 200 MG tablet Take 1 tablet (200 mg total) by mouth 2 (two) times daily. 180 tablet 3   aspirin EC 81 MG tablet Take 1 tablet (81 mg total) by mouth daily. Swallow whole. 30 tablet 0   atorvastatin (LIPITOR) 20 MG tablet Take 1 tablet (20 mg total) by mouth at bedtime. 90 tablet 1   DULoxetine (CYMBALTA) 20 MG capsule TAKE 1 CAPSULE (20 MG TOTAL) BY MOUTH DAILY. FOR ANXIETY AND PAIN. 90 capsule 2   feeding supplement, ENSURE ENLIVE, (ENSURE ENLIVE) LIQD Take 237 mLs by mouth 2 (two) times daily between meals.     ferrous sulfate 325 (65 FE) MG EC tablet Take 1 tablet (325 mg total) by mouth daily. 60 tablet 3   fluticasone-salmeterol (ADVAIR DISKUS) 250-50 MCG/ACT AEPB INHALE 1 PUFF INTO THE LUNGS TWICE A DAY 60 each 5   nitroGLYCERIN (NITROSTAT) 0.4 MG SL tablet Place 1 tablet (0.4 mg total) under the tongue every 5 (five) minutes as needed for chest pain. 20 tablet  5   omeprazole (PRILOSEC) 40 MG capsule Take 1 capsule (40 mg total) by mouth daily. 90 capsule 3   Pediatric Multiple Vitamins (FLINTSTONES MULTIVITAMIN) CHEW Chew by mouth.     potassium chloride SA (KLOR-CON M) 20 MEQ tablet Take 2 potassium tablets today and then take 1 potassium for every torsemide you take 30 tablet 3   Probiotic Product (PROBIOTIC ADVANCED) CAPS Take 1 capsule by mouth daily.     spironolactone (ALDACTONE) 25 MG tablet Take 1 tablet (25 mg total) by mouth daily. 90 tablet 3   trolamine salicylate (ASPERCREME) 10 % cream Apply 1 application. topically daily as needed for muscle pain (back or joint pain).  vitamin B-12 (CYANOCOBALAMIN) 500 MCG tablet Take 500 mcg by mouth 4 (four) times a week. 4 times weekly no more than 2000 MCG per week     metoprolol succinate (TOPROL-XL) 25 MG 24 hr tablet Take 1 tablet (25 mg total) by mouth daily. Take with or immediately following a meal. 90 tablet 3   torsemide (DEMADEX) 20 MG tablet Take 3 tablets (60 mg total) by mouth daily for 3 days, THEN 2 tablets (40 mg total) daily. 75 tablet 11   Current Facility-Administered Medications  Medication Dose Route Frequency Provider Last Rate Last Admin   albumin human 25 % solution 25 g  25 g Intravenous Once [name], MD       BP 135/79 (BP Location: Right Arm, Patient Position: Sitting, Cuff Size: Normal)   Pulse 76   Resp 18   Wt 156 lb (70.8 kg)   SpO2 97%   BMI 27.63 kg/m  General: NAD Neck: JVP 14-16 cm with HJR, no thyromegaly or thyroid nodule.  Lungs: Clear to auscultation bilaterally with normal respiratory effort. CV: Nondisplaced PMI.  Heart regular S1/S2, no S3/S4, 3/6 HSM LLSB/apex.  No peripheral edema.  No carotid bruit.  Normal pedal pulses.  Abdomen: Soft, nontender, no hepatosplenomegaly, moderate distention.  Skin: Intact without lesions or rashes.  Neurologic: Alert and oriented x 3.  Psych: Normal affect. Extremities: No clubbing or cyanosis.  HEENT:  Normal.   Assessment/Plan: 1. CAD: Cath in 4/21 with occluded RCA with collaterals and moderate LAD, LCx disease.  No chest pain.  - Continue atorvastatin, check lipids today.  - Continue ASA 81 daily.  2. SVT: She has been on amiodarone for this.  Required adenosine to terminate at 10/23 admission.   - Keep amiodarone minimal with cirrhosis, I will decrease it to 200 mg daily today and likely to 100 mg daily in the future if no symptomatic SVT. Check LFTs and TSH today, she will need regular eye exam.  3. Chronic systolic CHF: Suspect mixed ischemic and valvular cardiomyopathy. Echo in 9/23 showed EF 30-35%, mild LVH, severe RV dysfunction with moderate RV enlargement, severe MR, severe TR. She is severely volume overloaded with NYHA class IIIb symptoms. She is only on torsemide 20 mg daily.  - Increase torsemide to 60 mg daily x 3 days then 40 mg daily after that.  BMET/BNP today and BMET in 1 week.  - Add spironolactone 25 mg daily with cardiomyopathy and ascites.  4. Cirrhosis with ascites: Suspect cardiogenic cirrhosis due to RV failure.   She has significant ascites on exam.  - I will arrange for paracentesis with IR.  - Add spironolactone as above.  - Follows with GI.  5. Valvular heart disease: Severe MR and severe TR, etiology not clear from review of echo. She is not a candidate for open heart surgery with cirrhosis, poor mobility (MS), and frailty.  No evaluation in the past for mTEER as thought to be too high risk for TEE.  - I think that we could get her through TEE though risk of complication would be higher.  After discussion of risks/benefits with the patient and her daughter, we decided to proceed with TEE evaluation.   - She would be a difficult candidate even for mTEER, but will review with structural heart team after TEE. - I we cannot treat her mitral valve, CHF will continue to progress.  6. CKD stage 3: BMET today.   Followup in 3 wks with me.   Anita [name]  Anita [name] 01/01/2023

## 2023-01-01 NOTE — Patient Instructions (Addendum)
INCREASE Torsemide to 60 mg daily for 3 days, then resume normal dose of 40 mg daily thereafter START Spironolactone to 25 mg one tab daily  Labs today We will only contact you if something comes back abnormal or we need to make some changes. Otherwise no news is good news!  Labs needed in one week -please check in at the [facility] Admissions  You have been referred to [facility]-IR Paracentesis  -we will call with appt  Please give [place] a call once you are ready to schedule TEE  Your physician has requested that you have a TEE. During a TEE, sound waves are used to create images of your heart. It provides your doctor with information about the size and shape of your heart and how well your heart's chambers and valves are working. In this test, a transducer is attached to the end of a flexible tube that's guided down your throat and into your esophagus (the tube leading from you mouth to your stomach) to get a more detailed image of your heart. You are not awake for the procedure. Please see the instruction sheet given to you today. For further information please visit [email or web address].  Your physician recommends that you schedule a follow-up appointment in: 3-6 weeks  with Dr Aundra Dubin -we will call you with that appointment

## 2023-01-02 ENCOUNTER — Telehealth (HOSPITAL_COMMUNITY): Payer: Self-pay

## 2023-01-02 ENCOUNTER — Other Ambulatory Visit: Payer: Self-pay | Admitting: Primary Care

## 2023-01-02 ENCOUNTER — Encounter: Payer: Medicare Other | Admitting: Cardiology

## 2023-01-02 DIAGNOSIS — I5020 Unspecified systolic (congestive) heart failure: Secondary | ICD-10-CM

## 2023-01-02 DIAGNOSIS — K219 Gastro-esophageal reflux disease without esophagitis: Secondary | ICD-10-CM

## 2023-01-02 NOTE — Telephone Encounter (Signed)
I spoke to her daughter and informed her of labs. They are holding off on treatment and further labs until she has recovered from her surgery. She indicated the family was aware and they were going to work to get taken care of. I attempted to explain the importance of getting this checked and treated, but she stated they wanted to wait.

## 2023-01-05 ENCOUNTER — Ambulatory Visit: Payer: Medicare Other | Admitting: Family

## 2023-01-06 ENCOUNTER — Ambulatory Visit: Payer: Medicare Other | Admitting: Family

## 2023-01-06 ENCOUNTER — Other Ambulatory Visit: Payer: Medicare Other

## 2023-01-06 DIAGNOSIS — Z515 Encounter for palliative care: Secondary | ICD-10-CM

## 2023-01-06 NOTE — Progress Notes (Signed)
PATIENT NAME: Anita [name] DOB: 05-Jun-1961 MRN: [id number]  PRIMARY CARE PROVIDER: [name], NP  RESPONSIBLE PARTY:  Acct ID - Guarantor Home Phone Work Phone Relationship Acct Type  [id number] Harman, Ferrin [phone]  Self P/F     [address], [place], [place] [address]   I connected with  Cindy-daughter for [name] on 01/06/23 by telephone and verified that I am speaking with the correct person using two identifiers.   I discussed the limitations of evaluation and management by telemedicine. The patient expressed understanding and agreed to proceed.   Palliative Care:  Discussed recent changes within the Palliative Care Program.  Daughter aware NP is no longer seeing home patients and RN/SW is now following patient.  Daughter agreeable to continue with services but would like to defer Palliative Care appointment till next month.    CHF:  worsening heart failure over the last month.  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  They have not seen anyone in about 1 month.  Daughter was aware that EMT would be retiring in the next month and was to train her replacement.  Advised that I would follow up to see where we stand with replacement.  Daughter reports multiple follow up visits are scheduled to address HF in the coming weeks and reason to defer PC home visit.  She is agreeable to a phone call next month to schedule a home visit.    Lorenza Burton, RN

## 2023-01-11 ENCOUNTER — Other Ambulatory Visit (HOSPITAL_BASED_OUTPATIENT_CLINIC_OR_DEPARTMENT_OTHER): Payer: Self-pay | Admitting: Cardiology

## 2023-01-11 DIAGNOSIS — I255 Ischemic cardiomyopathy: Secondary | ICD-10-CM

## 2023-01-11 DIAGNOSIS — I5042 Chronic combined systolic (congestive) and diastolic (congestive) heart failure: Secondary | ICD-10-CM

## 2023-01-11 DIAGNOSIS — I471 Supraventricular tachycardia, unspecified: Secondary | ICD-10-CM

## 2023-01-11 DIAGNOSIS — I4729 Other ventricular tachycardia: Secondary | ICD-10-CM

## 2023-01-20 ENCOUNTER — Telehealth (HOSPITAL_COMMUNITY): Payer: Self-pay | Admitting: Licensed Clinical Social Worker

## 2023-01-20 ENCOUNTER — Telehealth: Payer: Self-pay

## 2023-01-20 NOTE — Telephone Encounter (Signed)
Spoke with pt's daughter, [name], to inquire about scheduling paracentesis and appt with Dr. Aundra Dubin. Unable to schedule paracentesis at pt's last appt because of schedule conflict with daughter's limited availability r/t her upcoming surgery. Daughter had her surgery/recovery now.   Pt needs paracentesis and appt but daughter is not available to bring her this month until after scheduled times She states that she cannot miss work and she does not have anyone else who can help.  Next available appt with [facility] that the daughter could make was 02/12/23. MD aware.  I spoke with daughter about contacting their insurance about transportation, and I sent message to social worker to see if we can offer assistance in arranging transportation. Patient is in a wheelchair and would need handicap/medical transportation.

## 2023-01-20 NOTE — Telephone Encounter (Signed)
CSW received message from Clinic staff requesting assistance with transportation. Pt's daughter, [name], is her caregiver but has been recently needing to postpone pt's appts and procedures because of her own recent circumstances (recent surgery and now states she's unable to miss work to bring her to appts). Patient is in a wheelchair and would need handicap/medical transportation. CSW left message for daughter and sent order to [facility] for additional assistance. CSW will attempt to contact daughter again. Raquel Sarna, [place], [place]

## 2023-01-23 ENCOUNTER — Telehealth: Payer: Self-pay | Admitting: Licensed Clinical Social Worker

## 2023-01-23 NOTE — Telephone Encounter (Signed)
CSW attempted again to reach patient and her daughter to discuss transportation concerns for needed procedure. CSW left message for return call. Raquel Sarna, [place], [place]

## 2023-02-01 ENCOUNTER — Encounter: Payer: Self-pay | Admitting: Oncology

## 2023-02-03 ENCOUNTER — Encounter: Payer: Self-pay | Admitting: Oncology

## 2023-02-12 ENCOUNTER — Encounter: Payer: Self-pay | Admitting: *Deleted

## 2023-02-12 ENCOUNTER — Encounter: Payer: Self-pay | Admitting: Cardiology

## 2023-02-12 ENCOUNTER — Other Ambulatory Visit: Payer: Self-pay

## 2023-02-12 ENCOUNTER — Ambulatory Visit: Payer: Medicare Other | Attending: Cardiology | Admitting: Cardiology

## 2023-02-12 ENCOUNTER — Encounter: Payer: Self-pay | Admitting: Oncology

## 2023-02-12 ENCOUNTER — Other Ambulatory Visit: Payer: Self-pay | Admitting: Cardiology

## 2023-02-12 ENCOUNTER — Other Ambulatory Visit
Admission: RE | Admit: 2023-02-12 | Discharge: 2023-02-12 | Disposition: A | Discharge status: Home / Self Care | Payer: Medicare Other | Source: Ambulatory Visit | Attending: Cardiology | Admitting: Cardiology

## 2023-02-12 DIAGNOSIS — I5042 Chronic combined systolic (congestive) and diastolic (congestive) heart failure: Secondary | ICD-10-CM

## 2023-02-12 DIAGNOSIS — I255 Ischemic cardiomyopathy: Secondary | ICD-10-CM | POA: Diagnosis not present

## 2023-02-12 DIAGNOSIS — I340 Nonrheumatic mitral (valve) insufficiency: Secondary | ICD-10-CM

## 2023-02-12 DIAGNOSIS — I5020 Unspecified systolic (congestive) heart failure: Secondary | ICD-10-CM

## 2023-02-12 LAB — BASIC METABOLIC PANEL
Anion gap: 9 (ref 5–15)
BUN: 30 mg/dL — ABNORMAL HIGH (ref 6–20)
CO2: 22 mmol/L (ref 22–32)
Calcium: 8.5 mg/dL — ABNORMAL LOW (ref 8.9–10.3)
Chloride: 106 mmol/L (ref 98–111)
Creatinine, Ser: 1.93 mg/dL — ABNORMAL HIGH (ref 0.44–1.00)
GFR, Estimated: 29 mL/min — ABNORMAL LOW (ref >60)
Glucose, Bld: 91 mg/dL (ref 70–99)
Potassium: 3.8 mmol/L (ref 3.5–5.1)
Sodium: 137 mmol/L (ref 135–145)

## 2023-02-12 LAB — BRAIN NATRIURETIC PEPTIDE: B Natriuretic Peptide: 3137 pg/mL — ABNORMAL HIGH (ref 0.0–100.0)

## 2023-02-12 MED ORDER — TORSEMIDE 20 MG PO TABS: 40.0000 mg | ORAL_TABLET | Freq: Two times a day (BID) | ORAL | 3 refills | Status: DC

## 2023-02-12 MED ORDER — DAPAGLIFLOZIN PROPANEDIOL 10 MG PO TABS: 10.0000 mg | ORAL_TABLET | Freq: Every day | ORAL | 11 refills | Status: DC

## 2023-02-12 MED ORDER — POTASSIUM CHLORIDE CRYS ER 20 MEQ PO TBCR: 40.0000 meq | EXTENDED_RELEASE_TABLET | Freq: Two times a day (BID) | ORAL | 3 refills | Status: DC

## 2023-02-12 NOTE — Patient Instructions (Signed)
START Farxiga '10mg'$  daily  TAKE torsemide to '40mg'$  twice daily  TAKE Potassium 82mq twice daily  Routine lab work today. Will notify you of abnormal results  Repeat labs in 2 weeks  Your provider has scheduled you for a paracentesis: 02/13/2023 2:00pm [facility] have [facility]. (We will call you with appointment information)

## 2023-02-12 NOTE — Progress Notes (Signed)
PCP: [name], NP HF Cardiology: Dr. Aundra Dubin  62 y.o. with history of multiple sclerosis, CAD, chronic systolic CHF, congestive hepatopathy and valvular heart disease was referred by [name] for CHF evaluation.  Last echo in 9/23 showed EF 30-35%, mild LVH, severe RV dysfunction with moderate RV enlargement, severe MR, severe TR.  She has been struggling with volume overload including cardiogenic ascites for months. She has multiple sclerosis, secondary progressive. She walks with a rolling walker and has significant left leg weakness.  She has slurred speech and some visual difficulty.  She was deemed not a surgical candidate for her valves, and she was thought to be too high risk for TEE.  Her last cath was in 4/21 and showed occluded RHC with collaterals.  There was moderate disease in the LAD and LCx not thought to be hemodynamically significant, medically managed. She has had several paracenteses over the last couple of months for ascites.  She has cirrhosis likely secondary to RV failure. Most recent admission was in 10/23 with SVT requiring adenosine and volume overload treated with Lasix gtt.   Patient returns for followup of CHF.  At last appt, I recommended that she get paracentesis but her daughter has not yet been able to arrange to take her for this.  She has increasing abdominal distention. It is harder for her to breathe with abdominal distention.  She is short of breath walking around the house.  She has orthopnea.  No lightheadedness.  No chest pain. Weight is down 9 lbs with increased torsemide and spironolactone.   Labs (1/24): K 4.2, creatinine 1.69, AST/ALT/bilirubin normal, LDL 56  PMH: 1. CAD: LHC in 4/21 with 60% mLAD, 70% D1, 65% mLCx, occluded proximal RCA with collaterals. Medically managed.  2. COPD 3. H/o SVT 4. Multiple sclerosis: Secondary progressive.  5. Anemia of CKD 6. CKD stage 3 7. B12 deficiency.  8. Chronic systolic CHF: Mixed ischemic and  valvular cardiomyopathy.   - Echo (9/23): EF 30-35%, mild LVH, severe RV dysfunction with moderate RV enlargement, severe MR, severe TR.  9. Valvular heart disease: Severe MR and severe TR on 9/23 echo. Not candidate for surgical valve replacement.  10. Cirrhosis: Suspect due to RV failure/hepatic congestion.  - H/o paracenteses.   Social History   Socioeconomic History   Marital status: Divorced    Spouse name: Not on file   Number of children: 2   Years of education: Not on file   Highest education level: Not on file  Occupational History   Not on file  Tobacco Use   Smoking status: Former    Packs/day: 1.00    Types: Cigarettes   Smokeless tobacco: Never  Vaping Use   Vaping Use: Never used  Substance and Sexual Activity   Alcohol use: Yes    Comment: Occasional   Drug use: Never   Sexual activity: Not on file  Other Topics Concern   Not on file  Social History Narrative   Right handed    Lives in one story home   Drinks Caffeine - Sweet Tea    Social Determinants of Health   Financial Resource Strain: Low Risk  (07/08/2022)   Overall Financial Resource Strain (CARDIA)    Difficulty of Paying Living Expenses: Not hard at all  Food Insecurity: No Food Insecurity (09/17/2022)   Hunger Vital Sign    Worried About Running Out of Food in the Last Year: Never true    [place] in the  Last Year: Never true  Transportation Needs: No Transportation Needs (09/17/2022)   PRAPARE - Hydrologist (Medical): No    Lack of Transportation (Non-Medical): No  Physical Activity: Inactive (07/08/2022)   Exercise Vital Sign    Days of Exercise per Week: 0 days    Minutes of Exercise per Session: 0 min  Stress: No Stress Concern Present (07/08/2022)   Pound    Feeling of Stress : Not at all  Social Connections: Socially Isolated (07/08/2022)   Social Connection and Isolation Panel  [NHANES]    Frequency of Communication with Friends and Family: Three times a week    Frequency of Social Gatherings with Friends and Family: Never    Attends Religious Services: Never    Marine scientist or Organizations: No    Attends Archivist Meetings: Never    Marital Status: Divorced  Human resources officer Violence: Not At Risk (09/17/2022)   Humiliation, Afraid, Rape, and Kick questionnaire    Fear of Current or Ex-Partner: No    Emotionally Abused: No    Physically Abused: No    Sexually Abused: No   Family History  Problem Relation Age of Onset   Skin cancer Mother    Lung cancer Father    Uterine cancer Sister    Heart disease Brother    Bipolar disorder Sister    Throat cancer Paternal Grandmother    ROS: All systems reviewed and negative except as per HPI.   Current Outpatient Medications  Medication Sig Dispense Refill   albuterol (VENTOLIN HFA) 108 (90 Base) MCG/ACT inhaler INHALE 1-2 PUFFS INTO THE LUNGS EVERY 6 (SIX) HOURS AS NEEDED FOR WHEEZING OR SHORTNESS OF BREATH. USE SPARINGLY! 18 each 0   amiodarone (PACERONE) 200 MG tablet Take 1 tablet (200 mg total) by mouth 2 (two) times daily. (Patient taking differently: Take 200 mg by mouth daily.) 180 tablet 3   aspirin EC 81 MG tablet Take 1 tablet (81 mg total) by mouth daily. Swallow whole. 30 tablet 0   atorvastatin (LIPITOR) 20 MG tablet Take 1 tablet (20 mg total) by mouth at bedtime. 90 tablet 1   dapagliflozin propanediol (FARXIGA) 10 MG TABS tablet Take 1 tablet (10 mg total) by mouth daily before breakfast. 30 tablet 11   DULoxetine (CYMBALTA) 20 MG capsule TAKE 1 CAPSULE (20 MG TOTAL) BY MOUTH DAILY. FOR ANXIETY AND PAIN. 90 capsule 2   ferrous sulfate 325 (65 FE) MG EC tablet Take 1 tablet (325 mg total) by mouth daily. 60 tablet 3   fluticasone-salmeterol (ADVAIR DISKUS) 250-50 MCG/ACT AEPB INHALE 1 PUFF INTO THE LUNGS TWICE A DAY 60 each 5   metoprolol succinate (TOPROL-XL) 25 MG 24 hr tablet  Take 1 tablet (25 mg total) by mouth daily. 90 tablet 1   omeprazole (PRILOSEC) 40 MG capsule Take 1 capsule (40 mg total) by mouth daily. For heartburn. 90 capsule 1   Pediatric Multiple Vitamins (FLINTSTONES MULTIVITAMIN) CHEW Chew by mouth.     Probiotic Product (PROBIOTIC ADVANCED) CAPS Take 1 capsule by mouth daily.     spironolactone (ALDACTONE) 25 MG tablet Take 1 tablet (25 mg total) by mouth daily. 90 tablet 3   trolamine salicylate (ASPERCREME) 10 % cream Apply 1 application. topically daily as needed for muscle pain (back or joint pain).     vitamin B-12 (CYANOCOBALAMIN) 500 MCG tablet Take 500 mcg by mouth 4 (four) times a  week. 4 times weekly no more than 2000 MCG per week     feeding supplement, ENSURE ENLIVE, (ENSURE ENLIVE) LIQD Take 237 mLs by mouth 2 (two) times daily between meals.     nitroGLYCERIN (NITROSTAT) 0.4 MG SL tablet Place 1 tablet (0.4 mg total) under the tongue every 5 (five) minutes as needed for chest pain. 20 tablet 5   potassium chloride SA (KLOR-CON M20) 20 MEQ tablet Take 2 tablets (40 mEq total) by mouth 2 (two) times daily. 120 tablet 3   torsemide (DEMADEX) 20 MG tablet Take 2 tablets (40 mg total) by mouth 2 (two) times daily. 120 tablet 3   Current Facility-Administered Medications  Medication Dose Route Frequency Provider Last Rate Last Admin   albumin human 25 % solution 25 g  25 g Intravenous Once [name], MD       BP 125/82   Pulse 72   Wt 147 lb (66.7 kg)   SpO2 100%   BMI 26.04 kg/m  General: NAD Neck: JVP 10-12 cm, no thyromegaly or thyroid nodule.  Lungs: Clear to auscultation bilaterally with normal respiratory effort. CV: Nondisplaced PMI.  Heart regular S1/S2, no S3/S4, 2/6 HSM LLSB.  No peripheral edema.  No carotid bruit.  Normal pedal pulses.  Abdomen: Tight, nontender, no hepatosplenomegaly, moderate distention.  Skin: Intact without lesions or rashes.  Neurologic: Alert and oriented x 3.  Psych: Normal affect. Extremities:  No clubbing or cyanosis.  HEENT: Normal.   Assessment/Plan: 1. CAD: Cath in 4/21 with occluded RCA with collaterals and moderate LAD, LCx disease.  No chest pain.  - Continue atorvastatin, good lipids in 1/24.  - Continue ASA 81 daily.  2. SVT: She has been on amiodarone for this.  Required adenosine to terminate at 10/23 admission.   - Keep amiodarone minimal with cirrhosis, continue 200 mg daily for now but will likely decrease to 100 mg daily in the future if no symptomatic SVT. Recent LFTs and TSH normal, she will need regular eye exam.  3. Chronic systolic CHF: Suspect mixed ischemic and valvular cardiomyopathy. Echo in 9/23 showed EF 30-35%, mild LVH, severe RV dysfunction with moderate RV enlargement, severe MR, severe TR. She remains volume overloaded with NYHA class IIIb symptoms.   - Increase torsemide to 40 mg bid and increase KCl to 40 bid.  BMET/BNP today and BMET in 1 week.  - Continue spironolactone 25 mg daily with cardiomyopathy and ascites.  - Add Farxiga 10 mg daily.  - She became hypotensive on Entresto in the past.  - Continue Toprol XL 25 mg daily.  4. Cirrhosis with ascites: Suspect cardiogenic cirrhosis due to RV failure.   She still has significant ascites on exam with tight abdomen.  - I arranged for her to get IR-guided paracentesis tomorrow.  - Continue spironolactone.   - Follows with GI.  5. Valvular heart disease: Severe MR and severe TR, etiology not clear from review of echo. She is not a candidate for open heart surgery with cirrhosis, poor mobility (MS), and frailty.  No evaluation in the past for mTEER as thought to be too high risk for TEE.  - I think that we could get her through TEE though risk of complication would be higher.  After discussion of risks/benefits with the patient and her daughter, we decided to proceed with TEE evaluation.  Her daughter is ready to schedule this.   - She would be a difficult candidate even for mTEER, but will review with  structural heart team after TEE. - I we cannot treat her mitral valve, CHF will continue to progress.  6. CKD stage 3: BMET today.   Followup in 2 wks with APP and 6 wks with me.   Loralie Champagne 02/12/2023

## 2023-02-13 ENCOUNTER — Ambulatory Visit
Admission: RE | Admit: 2023-02-13 | Discharge: 2023-02-13 | Disposition: A | Discharge status: Home / Self Care | Payer: Medicare Other | Source: Ambulatory Visit | Attending: Cardiology | Admitting: Cardiology

## 2023-02-13 DIAGNOSIS — R188 Other ascites: Secondary | ICD-10-CM

## 2023-02-13 MED ORDER — LIDOCAINE HCL (PF) 1 % IJ SOLN
10.0000 mL | Freq: Once | INTRAMUSCULAR | Status: AC
  Administered 2023-02-13: 10 mL via INTRADERMAL

## 2023-02-13 NOTE — Procedures (Signed)
PROCEDURE SUMMARY:  Successful image-guided paracentesis from the right lower abdomen.  Yielded 8.5 liters of clear yellow fluid.  No immediate complications.  EBL = trace. Patient tolerated well.   Specimen was not sent for labs.  Please see imaging section of Epic for full dictation.   Lura Em PA-C 02/13/2023 4:13 PM

## 2023-02-17 ENCOUNTER — Other Ambulatory Visit (HOSPITAL_COMMUNITY): Payer: Self-pay

## 2023-03-16 ENCOUNTER — Other Ambulatory Visit (HOSPITAL_BASED_OUTPATIENT_CLINIC_OR_DEPARTMENT_OTHER): Payer: Self-pay | Admitting: Family

## 2023-03-17 MED ORDER — SODIUM CHLORIDE 0.9 % IV SOLN: INTRAVENOUS | Status: DC

## 2023-03-17 NOTE — Telephone Encounter (Signed)
Rx request sent to pharmacy.  

## 2023-03-17 NOTE — Progress Notes (Signed)
Procedure time changed due to anesthesia type requested.  Patient called by this nurse to notify of new arrival time/procedure time 11:30 for 12:30 procedure start time.  Spoke with patient's daughter who agreed that time change would be fine.  Instructed to follow all other pre procedure instructions given to patient and family for tomorrow's procedure.

## 2023-03-18 ENCOUNTER — Ambulatory Visit (HOSPITAL_BASED_OUTPATIENT_CLINIC_OR_DEPARTMENT_OTHER)
Admission: RE | Admit: 2023-03-18 | Discharge: 2023-03-18 | Disposition: A | Discharge status: Home / Self Care | Payer: Medicare Other | Source: Home / Self Care | Attending: Cardiology | Admitting: Cardiology

## 2023-03-18 ENCOUNTER — Encounter
Admission: RE | Disposition: A | Discharge status: Home / Self Care | Payer: Self-pay | Source: Home / Self Care | Attending: Cardiology

## 2023-03-18 ENCOUNTER — Ambulatory Visit: Payer: Medicare Other | Admitting: Anesthesiology

## 2023-03-18 ENCOUNTER — Encounter: Payer: Self-pay | Admitting: Cardiology

## 2023-03-18 ENCOUNTER — Other Ambulatory Visit: Payer: Self-pay

## 2023-03-18 ENCOUNTER — Ambulatory Visit
Admission: RE | Admit: 2023-03-18 | Discharge: 2023-03-18 | Disposition: A | Discharge status: Home / Self Care | Payer: Medicare Other | Attending: Cardiology | Admitting: Cardiology

## 2023-03-18 DIAGNOSIS — J449 Chronic obstructive pulmonary disease, unspecified: Secondary | ICD-10-CM | POA: Insufficient documentation

## 2023-03-18 DIAGNOSIS — I272 Pulmonary hypertension, unspecified: Secondary | ICD-10-CM | POA: Diagnosis not present

## 2023-03-18 DIAGNOSIS — I340 Nonrheumatic mitral (valve) insufficiency: Secondary | ICD-10-CM | POA: Diagnosis not present

## 2023-03-18 DIAGNOSIS — I110 Hypertensive heart disease with heart failure: Secondary | ICD-10-CM | POA: Insufficient documentation

## 2023-03-18 DIAGNOSIS — I5043 Acute on chronic combined systolic (congestive) and diastolic (congestive) heart failure: Secondary | ICD-10-CM | POA: Diagnosis not present

## 2023-03-18 DIAGNOSIS — I361 Nonrheumatic tricuspid (valve) insufficiency: Secondary | ICD-10-CM | POA: Diagnosis not present

## 2023-03-18 DIAGNOSIS — I083 Combined rheumatic disorders of mitral, aortic and tricuspid valves: Secondary | ICD-10-CM | POA: Insufficient documentation

## 2023-03-18 DIAGNOSIS — G35 Multiple sclerosis: Secondary | ICD-10-CM | POA: Insufficient documentation

## 2023-03-18 DIAGNOSIS — D631 Anemia in chronic kidney disease: Secondary | ICD-10-CM | POA: Diagnosis not present

## 2023-03-18 DIAGNOSIS — I2510 Atherosclerotic heart disease of native coronary artery without angina pectoris: Secondary | ICD-10-CM | POA: Diagnosis not present

## 2023-03-18 DIAGNOSIS — I252 Old myocardial infarction: Secondary | ICD-10-CM | POA: Diagnosis not present

## 2023-03-18 DIAGNOSIS — K219 Gastro-esophageal reflux disease without esophagitis: Secondary | ICD-10-CM | POA: Diagnosis not present

## 2023-03-18 DIAGNOSIS — Z87891 Personal history of nicotine dependence: Secondary | ICD-10-CM | POA: Diagnosis not present

## 2023-03-18 DIAGNOSIS — N1832 Chronic kidney disease, stage 3b: Secondary | ICD-10-CM | POA: Diagnosis not present

## 2023-03-18 DIAGNOSIS — I5032 Chronic diastolic (congestive) heart failure: Secondary | ICD-10-CM | POA: Diagnosis not present

## 2023-03-18 DIAGNOSIS — I130 Hypertensive heart and chronic kidney disease with heart failure and stage 1 through stage 4 chronic kidney disease, or unspecified chronic kidney disease: Secondary | ICD-10-CM | POA: Diagnosis not present

## 2023-03-18 HISTORY — PX: TEE WITHOUT CARDIOVERSION: SHX5443

## 2023-03-18 LAB — ECHO TEE
Est EF: 40
Radius: 1.2 cm

## 2023-03-18 SURGERY — ECHOCARDIOGRAM, TRANSESOPHAGEAL
Anesthesia: General

## 2023-03-18 MED ORDER — BUTAMBEN-TETRACAINE-BENZOCAINE 2-2-14 % EX AERO
INHALATION_SPRAY | CUTANEOUS | Status: AC
  Filled 2023-03-18: qty 5

## 2023-03-18 MED ORDER — PROPOFOL 10 MG/ML IV BOLUS
INTRAVENOUS | Status: AC
  Filled 2023-03-18: qty 40

## 2023-03-18 MED ORDER — ETOMIDATE 2 MG/ML IV SOLN
INTRAVENOUS | Status: AC
  Filled 2023-03-18: qty 10

## 2023-03-18 MED ORDER — LIDOCAINE HCL (CARDIAC) PF 100 MG/5ML IV SOSY
PREFILLED_SYRINGE | INTRAVENOUS | Status: DC | PRN
  Administered 2023-03-18: 60 mg via INTRAVENOUS
  Administered 2023-03-18: 40 mg via INTRAVENOUS

## 2023-03-18 MED ORDER — PROPOFOL 500 MG/50ML IV EMUL
INTRAVENOUS | Status: DC | PRN
  Administered 2023-03-18 (×2): 20 ug via INTRAVENOUS

## 2023-03-18 MED ORDER — ETOMIDATE 2 MG/ML IV SOLN
INTRAVENOUS | Status: DC | PRN
  Administered 2023-03-18: 4 mg via INTRAVENOUS
  Administered 2023-03-18: 2 mg via INTRAVENOUS
  Administered 2023-03-18: 4 mg via INTRAVENOUS
  Administered 2023-03-18 (×2): 2 mg via INTRAVENOUS

## 2023-03-18 NOTE — Progress Notes (Signed)
*  PRELIMINARY RESULTS* Echocardiogram Echocardiogram Transesophageal has been performed.  Carolyne Fiscal 03/18/2023, 1:39 PM

## 2023-03-18 NOTE — Anesthesia Procedure Notes (Signed)
Procedure Name: MAC Date/Time: 03/18/2023 12:40 PM  Performed by: Katherine Basset, CRNAPre-anesthesia Checklist: Patient identified, Emergency Drugs available, Suction available and Patient being monitored Patient Re-evaluated:Patient Re-evaluated prior to induction Oxygen Delivery Method: Nasal cannula Induction Type: IV induction

## 2023-03-18 NOTE — Anesthesia Preprocedure Evaluation (Addendum)
Anesthesia Evaluation  Patient identified by MRN, date of birth, ID band Patient awake    Reviewed: Allergy & Precautions, NPO status , Patient's Chart, lab work & pertinent test results  History of Anesthesia Complications Negative for: history of anesthetic complications  Airway Mallampati: II  TM Distance: >3 FB Neck ROM: Full    Dental  (+) Edentulous Upper, Edentulous Lower   Pulmonary shortness of breath and at rest, neg sleep apnea, COPD,  COPD inhaler, Patient abstained from smoking.Not current smoker, former smoker   Pulmonary exam normal breath sounds clear to auscultation       Cardiovascular Exercise Tolerance: Poor METS: < 3 Mets hypertension, pulmonary hypertension+ CAD, + Past MI, +CHF, + Orthopnea and + DOE  (-) dysrhythmias + Valvular Problems/Murmurs MR  Rhythm:Regular Rate:Tachycardia - Systolic murmurs Last echo in 7/41 showed EF 30-35%, mild LVH, severe RV dysfunction with moderate RV enlargement, severe MR, severe TR.  LHC 2021: - MODERATE-SEVERE THREE-VESSEL CAD:  100% proximal RCA occlusion with left-to-right collaterals faintly filling PDA and PL system (AV groove LCx-PL and LAD septal-PDA)  Diffuse moderate mid LAD disease with 60% to 50% stenosis.  Tandem 50% and 65% proximal and mid LCx with the 65% lesion being the most significant lesion.  Moderately elevated LVEDP of 18-20 mmHg   TTE 02/2021: 02/2021 Echo: EF 40-45%, glob HK, gr2 DD. Sev red RV fxn, RVSP 69.76mmHg. Mod dil LA, sev dil RA. Sev MR. Mild to mod MS. Mean MV grad 6.11mmHg. Mod-Sev TR. Mild AI. Mild Ao sclerosis w/o stenosis.   Most recent admission was in 10/23 with SVT requiring adenosine and volume overload treated with Lasix gtt   Neuro/Psych  Headaches Multiple sclerosis R ear hearing loss Abnormal Gait  Neuromuscular disease  negative psych ROS   GI/Hepatic ,GERD  ,,(+)     (-) substance abuse  Cardiac Ascites- Paracentesis  02/13/23 8.5L removed Esophageal dysphagia   Endo/Other  neg diabetes    Renal/GU Renal InsufficiencyRenal disease     Musculoskeletal   Abdominal   Peds  Hematology  (+) Blood dyscrasia, anemia   Anesthesia Other Findings Past Medical History: 07/26/2013: Abnormality of gait 03/20/2020: Acute respiratory distress 03/20/2020: Acute respiratory failure with hypoxia (HCC) No date: Allergy No date: Arthritis 03/22/2020: Atypical pneumonia No date: Chickenpox 03/22/2020: Elevated troponin No date: GERD (gastroesophageal reflux disease) No date: Headache(784.0)     Comment:  Migraine No date: Hearing loss     Comment:  Right ear secondary to infection No date: History of shingles No date: Migraines No date: Multiple sclerosis (HCC) No date: Obese No date: Optic neuritis 03/20/2020: Prolonged QT interval  Reproductive/Obstetrics                             Anesthesia Physical Anesthesia Plan  ASA: IV  Anesthesia Plan: MAC and General   Post-op Pain Management:    Induction: Intravenous  PONV Risk Score and Plan: TIVA and Treatment may vary due to age or medical condition  Airway Management Planned: Natural Airway and Simple Face Mask  Additional Equipment: None  Intra-op Plan:   Post-operative Plan:   Informed Consent: I have reviewed the patients History and Physical, chart, labs and discussed the procedure including the risks, benefits and alternatives for the proposed anesthesia with the patient or authorized representative who has indicated his/her understanding and acceptance.     Dental advisory given  Plan Discussed with: CRNA and Surgeon  Anesthesia Plan  Comments: (Discussed risks of anesthesia with patient, including possibility of difficulty with spontaneous ventilation under anesthesia necessitating airway intervention, PONV, and rare risks such as cardiac or respiratory or neurological events. Patient understands. Patient  counseled on being higher risk for anesthesia due to comorbidities: inoperable severe CAD and MR. Patient was told about increased risk of cardiac and respiratory events, including death. Patient understands. )        Anesthesia Quick Evaluation

## 2023-03-18 NOTE — CV Procedure (Signed)
Procedure: TEE  Sedation: Per anesthesiology  Indication: Mitral and tricuspid regurgitation  Findings: Please see echo section for full report.  Normal LV size with EF 40%, diffuse hypokinesis.  Moderately dilated RV with severe systolic dysfunction. Mildly D-shaped interventricular septum suggesting RV pressure/volume overload.  Severe right atrial enlargement.  Moderate left atrial enlargement.  No LA appendage thrombus.  No PFO or ASD by color doppler. There was severe tricuspid regurgitation, appears to be due to annular dilatation (functional).  Trileaflet aortic valve with no stenosis or regurgitation.  There was severe mitral regurgitation with incomplete leaflet coaptation, looks functional. There was systolic flow reversal by PW doppler in 1/2 pulmonary veins sampled. Normal caliber thoracic aorta with mild plaque.   Marca Ancona 03/18/2023 1:20 PM

## 2023-03-18 NOTE — Transfer of Care (Signed)
Immediate Anesthesia Transfer of Care Note  Patient: Anita [name]  Procedure(s) Performed: TRANSESOPHAGEAL ECHOCARDIOGRAM (TEE)  Patient Location: Short Stay  Anesthesia Type:General  Level of Consciousness: drowsy  Airway & Oxygen Therapy: Patient Spontanous Breathing and Patient connected to nasal cannula oxygen  Post-op Assessment: Report given to RN and Post -op Vital signs reviewed and stable  Post vital signs: Reviewed and stable  Last Vitals:  Vitals Value Taken Time  BP 133/81 03/18/23 1317  Temp 36.4 C 03/18/23 1317  Pulse    Resp 18 03/18/23 1317  SpO2 99 % 03/18/23 1317    Last Pain:  Vitals:   03/18/23 1317  TempSrc: Temporal  PainSc: 0-No pain         Complications: No notable events documented.

## 2023-03-18 NOTE — H&P (Signed)
Advanced Heart Failure Team History and Physical Note   PCP:  Doreene Nest, NP  PCP-Cardiology: Jodelle Red, MD     Reason for Admission: TEE   HPI:    HFpEF with severe MR and TR. See 02/12/23 note for history.  Plan for TEE today to assess MR and TR.    Review of Systems: [y] = yes, [ ]  = no   General: Weight gain [ ] ; Weight loss [ ] ; Anorexia [ ] ; Fatigue [ ] ; Fever [ ] ; Chills [ ] ; Weakness [ ]   Cardiac: Chest pain/pressure [ ] ; Resting SOB [ ] ; Exertional SOB [ ] ; Orthopnea [ ] ; Pedal Edema [ ] ; Palpitations [ ] ; Syncope [ ] ; Presyncope [ ] ; Paroxysmal nocturnal dyspnea[ ]   Pulmonary: Cough [ ] ; Wheezing[ ] ; Hemoptysis[ ] ; Sputum [ ] ; Snoring [ ]   GI: Vomiting[ ] ; Dysphagia[ ] ; Melena[ ] ; Hematochezia [ ] ; Heartburn[ ] ; Abdominal pain [ ] ; Constipation [ ] ; Diarrhea [ ] ; BRBPR [ ]   GU: Hematuria[ ] ; Dysuria [ ] ; Nocturia[ ]   Vascular: Pain in legs with walking [ ] ; Pain in feet with lying flat [ ] ; Non-healing sores [ ] ; Stroke [ ] ; TIA [ ] ; Slurred speech [ ] ;  Neuro: Headaches[ ] ; Vertigo[ ] ; Seizures[ ] ; Paresthesias[ ] ;Blurred vision [ ] ; Diplopia [ ] ; Vision changes [ ]   Ortho/Skin: Arthritis [ ] ; Joint pain [ ] ; Muscle pain [ ] ; Joint swelling [ ] ; Back Pain [ ] ; Rash [ ]   Psych: Depression[ ] ; Anxiety[ ]   Heme: Bleeding problems [ ] ; Clotting disorders [ ] ; Anemia [ ]   Endocrine: Diabetes [ ] ; Thyroid dysfunction[ ]    Home Medications Prior to Admission medications   Medication Sig Start Date End Date Taking? Authorizing Provider  amiodarone (PACERONE) 200 MG tablet Take 1 tablet (200 mg total) by mouth 2 (two) times daily. Patient taking differently: Take 200 mg by mouth daily. 09/22/22  Yes Alver Sorrow, NP  aspirin EC 81 MG tablet Take 1 tablet (81 mg total) by mouth daily. Swallow whole. 09/20/22  Yes Lynn Ito, MD  atorvastatin (LIPITOR) 20 MG tablet TAKE 1 TABLET BY MOUTH EVERYDAY AT BEDTIME 03/17/23  Yes Jodelle Red, MD   dapagliflozin propanediol (FARXIGA) 10 MG TABS tablet Take 1 tablet (10 mg total) by mouth daily before breakfast. 02/12/23  Yes Laurey Morale, MD  DULoxetine (CYMBALTA) 20 MG capsule TAKE 1 CAPSULE (20 MG TOTAL) BY MOUTH DAILY. FOR ANXIETY AND PAIN. 12/11/22  Yes Doreene Nest, NP  ferrous sulfate 325 (65 FE) MG EC tablet Take 1 tablet (325 mg total) by mouth daily. 10/01/22  Yes Rickard Patience, MD  metoprolol succinate (TOPROL-XL) 25 MG 24 hr tablet Take 1 tablet (25 mg total) by mouth daily. 01/12/23  Yes Alver Sorrow, NP  omeprazole (PRILOSEC) 40 MG capsule Take 1 capsule (40 mg total) by mouth daily. For heartburn. 01/04/23  Yes Doreene Nest, NP  Pediatric Multiple Vitamins (FLINTSTONES MULTIVITAMIN) CHEW Chew by mouth.   Yes [provider]  potassium chloride SA (KLOR-CON M20) 20 MEQ tablet Take 2 tablets (40 mEq total) by mouth 2 (two) times daily. 02/12/23  Yes Laurey Morale, MD  Probiotic Product (PROBIOTIC ADVANCED) CAPS Take 1 capsule by mouth daily.   Yes [provider]  spironolactone (ALDACTONE) 25 MG tablet Take 1 tablet (25 mg total) by mouth daily. 01/01/23  Yes Laurey Morale, MD  torsemide (DEMADEX) 20 MG tablet Take 2 tablets (40 mg total)  by mouth 2 (two) times daily. 02/12/23  Yes Laurey Morale, MD  trolamine salicylate (ASPERCREME) 10 % cream Apply 1 application. topically daily as needed for muscle pain (back or joint pain).   Yes [provider]  vitamin B-12 (CYANOCOBALAMIN) 500 MCG tablet Take 500 mcg by mouth 4 (four) times a week. 4 times weekly no more than 2000 MCG per week   Yes [provider]  albuterol (VENTOLIN HFA) 108 (90 Base) MCG/ACT inhaler INHALE 1-2 PUFFS INTO THE LUNGS EVERY 6 (SIX) HOURS AS NEEDED FOR WHEEZING OR SHORTNESS OF BREATH. USE SPARINGLY! 11/13/21   Doreene Nest, NP  feeding supplement, ENSURE ENLIVE, (ENSURE ENLIVE) LIQD Take 237 mLs by mouth 2 (two) times daily between meals. 03/28/20   Vassie Loll, MD  fluticasone-salmeterol (ADVAIR DISKUS) 250-50 MCG/ACT AEPB INHALE 1 PUFF INTO THE LUNGS TWICE A DAY 12/02/21   Doreene Nest, NP  nitroGLYCERIN (NITROSTAT) 0.4 MG SL tablet Place 1 tablet (0.4 mg total) under the tongue every 5 (five) minutes as needed for chest pain. 08/02/21   Delma Freeze, FNP    Past Medical History: Past Medical History:  Diagnosis Date   Abnormality of gait 07/26/2013   Acute respiratory failure with hypoxia 03/20/2020   Allergy    Arthritis    Atypical pneumonia 03/22/2020   CAD (coronary artery disease)    a. 03/2020 Cath: LM nl, LAD 60/57m, 55d, D1 70, D2 70, LCX 45ost/p, 65p, RCA 100ost CTO. RPDA fills via collats from dLAD. Inf septal fills via collats from 1st septal, RPAV fills via collats from LPAV, RPL1/2 sev dzs-->med rx.   Chickenpox    Chronic combined systolic (congestive) and diastolic (congestive) heart failure    a. 02/2021 Echo: EF 40-45%, glob HK, gr2 DD. Sev red RV fxn, RVSP 69.70mmHg. Mod dil LA, sev dil RA. Sev MR. Mild to mod MS. Mean MV grad 6.66mmHg. Mod-Sev TR. Mild AI. Mild Ao sclerosis w/o stenosis.   COPD (chronic obstructive pulmonary disease)    Elevated troponin 03/22/2020   GERD (gastroesophageal reflux disease)    Headache(784.0)    Migraine   Hearing loss    Right ear secondary to infection   History of shingles    Ischemic cardiomyopathy    a. 02/2021 Echo: EF 40-45%, glob HK.   Migraines    Multiple sclerosis    Obese    Optic neuritis    PAH (pulmonary artery hypertension)    a. 02/2021 Echo: RVSP 69.27mmHg.   Prolonged QT interval 03/20/2020   Severe mitral regurgitation    a. 02/2021 Echo: Severe MR w/ mild to mod MS.  Mean MV grad 6.71mmHg.    Past Surgical History: Past Surgical History:  Procedure Laterality Date   COLONOSCOPY WITH PROPOFOL N/A 08/03/2020   Procedure: COLONOSCOPY WITH PROPOFOL;  Surgeon: Wyline Mood, MD;  Location: [facility] ENDOSCOPY;  Service: Gastroenterology;  Laterality: N/A;    Ganglioneuroma     Resection   LEFT HEART CATH AND CORONARY ANGIOGRAPHY N/A 03/23/2020   Procedure: LEFT HEART CATH AND CORONARY ANGIOGRAPHY;  Surgeon: Marykay Lex, MD;  Location: [facility] INVASIVE CV LAB;  Service: Cardiovascular;  Laterality: N/A;   PILONIDAL CYST EXCISION     PILONIDAL CYST EXCISION  1983   TUMOR REMOVAL  2007/2008    Family History:  Family History  Problem Relation Age of Onset   Skin cancer Mother    Lung cancer Father    Uterine cancer Sister    Heart  disease Brother    Bipolar disorder Sister    Throat cancer Paternal Grandmother     Social History: Social History   Socioeconomic History   Marital status: Divorced    Spouse name: Not on file   Number of children: 2   Years of education: Not on file   Highest education level: Not on file  Occupational History   Not on file  Tobacco Use   Smoking status: Former    Packs/day: 1    Types: Cigarettes   Smokeless tobacco: Never  Vaping Use   Vaping Use: Never used  Substance and Sexual Activity   Alcohol use: Not Currently    Comment: Occasional   Drug use: Never   Sexual activity: Not on file  Other Topics Concern   Not on file  Social History Narrative   Right handed    Lives in one story home   Drinks Caffeine - Sweet Tea    Social Determinants of Health   Financial Resource Strain: Low Risk  (07/08/2022)   Overall Financial Resource Strain (CARDIA)    Difficulty of Paying Living Expenses: Not hard at all  Food Insecurity: No Food Insecurity (09/17/2022)   Hunger Vital Sign    Worried About Running Out of Food in the Last Year: Never true    Ran Out of Food in the Last Year: Never true  Transportation Needs: No Transportation Needs (09/17/2022)   PRAPARE - Administrator, Civil ServiceTransportation    Lack of Transportation (Medical): No    Lack of Transportation (Non-Medical): No  Physical Activity: Inactive (07/08/2022)   Exercise Vital Sign    Days of Exercise per Week: 0 days    Minutes of Exercise per  Session: 0 min  Stress: No Stress Concern Present (07/08/2022)   [employer] - Occupational Stress Questionnaire    Feeling of Stress : Not at all  Social Connections: Socially Isolated (07/08/2022)   Social Connection and Isolation Panel [NHANES]    Frequency of Communication with Friends and Family: Three times a week    Frequency of Social Gatherings with Friends and Family: Never    Attends Religious Services: Never    Database administratorActive Member of Clubs or Organizations: No    Attends BankerClub or Organization Meetings: Never    Marital Status: Divorced    Allergies:  Allergies  Allergen Reactions   Acthar Hp [Corticotropin] Other (See Comments)    Throat swelling and coughing up blood   Codeine    Prednisone     Objective:    Vital Signs:   Temp:  [97.7 F (36.5 C)] 97.7 F (36.5 C) (04/10 1159) Pulse Rate:  [65] 65 (04/10 1159) Resp:  [16] 16 (04/10 1159) BP: (128)/(67) 128/67 (04/10 1159) SpO2:  [100 %] 100 % (04/10 1159) Weight:  [63.5 kg] 63.5 kg (04/10 1159)   Filed Weights   03/18/23 1159  Weight: 63.5 kg     Physical Exam     General:  Well appearing. No respiratory difficulty HEENT: Normal Neck: Supple. no JVD. Carotids 2+ bilat; no bruits. No lymphadenopathy or thyromegaly appreciated. Cor: PMI nondisplaced. Regular rate & rhythm. 3/6 HSM LLSB/apex Lungs: Clear Abdomen: Soft, nontender, mildly distended. No hepatosplenomegaly. No bruits or masses. Good bowel sounds. Extremities: No cyanosis, clubbing, rash, edema Neuro: Alert & oriented x 3, cranial nerves grossly intact. moves all 4 extremities w/o difficulty. Affect pleasant.   Labs     Basic Metabolic Panel: No results for input(s): "NA", "K", "CL", "CO2", "  GLUCOSE", "BUN", "CREATININE", "CALCIUM", "MG", "PHOS" in the last 168 hours.  Liver Function Tests: No results for input(s): "AST", "ALT", "ALKPHOS", "BILITOT", "PROT", "ALBUMIN" in the last 168 hours. No results for input(s):  "LIPASE", "AMYLASE" in the last 168 hours. No results for input(s): "AMMONIA" in the last 168 hours.  CBC: No results for input(s): "WBC", "NEUTROABS", "HGB", "HCT", "MCV", "PLT" in the last 168 hours.  Cardiac Enzymes: No results for input(s): "CKTOTAL", "CKMB", "CKMBINDEX", "TROPONINI" in the last 168 hours.  BNP: BNP (last 3 results) Recent Labs    10/23/22 1447 01/01/23 1307 02/12/23 1144  BNP 2,466.8* 2,718.4* 3,137.0*    ProBNP (last 3 results) No results for input(s): "PROBNP" in the last 8760 hours.   CBG: No results for input(s): "GLUCAP" in the last 168 hours.  Coagulation Studies: No results for input(s): "LABPROT", "INR" in the last 72 hours.  Imaging: No results found.   Assessment/Plan   Plan for TEE today to assess MR and TR with anesthesiology.    Marca Ancona, MD 03/18/2023, 12:22 PM  Advanced Heart Failure Team Pager [phone] (M-F; 7a - 5p)  Please contact [facility] for night-coverage after hours (4p -7a ) and weekends on [email or web address]

## 2023-03-19 ENCOUNTER — Encounter: Payer: Self-pay | Admitting: Cardiology

## 2023-03-19 NOTE — Anesthesia Postprocedure Evaluation (Signed)
Anesthesia Post Note  Patient: Anita [name]  Procedure(s) Performed: TRANSESOPHAGEAL ECHOCARDIOGRAM (TEE)  Patient location during evaluation: Specials Recovery Anesthesia Type: General Level of consciousness: awake and alert Pain management: pain level controlled Vital Signs Assessment: post-procedure vital signs reviewed and stable Respiratory status: spontaneous breathing, nonlabored ventilation and respiratory function stable Cardiovascular status: blood pressure returned to baseline and stable Postop Assessment: no apparent nausea or vomiting Anesthetic complications: no   No notable events documented.   Last Vitals:  Vitals:   03/18/23 1400 03/18/23 1415  BP: 115/64 113/61  Pulse: 62 61  Resp: 17 16  Temp:    SpO2: 99% 100%    Last Pain:  Vitals:   03/18/23 1415  TempSrc:   PainSc: 0-No pain                 [name]

## 2023-03-19 NOTE — Addendum Note (Signed)
Addendum  created 03/19/23 0820 by [name], MD   Clinical Note Signed, Review and Sign - Ready for Procedure

## 2023-03-26 ENCOUNTER — Encounter: Payer: Medicare Other | Admitting: Cardiology

## 2023-03-27 ENCOUNTER — Inpatient Hospital Stay: Payer: Medicare Other

## 2023-03-31 ENCOUNTER — Ambulatory Visit (HOSPITAL_BASED_OUTPATIENT_CLINIC_OR_DEPARTMENT_OTHER): Payer: Medicare Other | Admitting: Family

## 2023-03-31 ENCOUNTER — Other Ambulatory Visit
Admission: RE | Admit: 2023-03-31 | Discharge: 2023-03-31 | Disposition: A | Discharge status: Home / Self Care | Payer: Medicare Other | Source: Ambulatory Visit | Attending: Family | Admitting: Family

## 2023-03-31 ENCOUNTER — Encounter: Payer: Self-pay | Admitting: Family

## 2023-03-31 VITALS — BP 122/78 | HR 66 | Resp 16 | Wt 140.2 lb

## 2023-03-31 DIAGNOSIS — I10 Essential (primary) hypertension: Secondary | ICD-10-CM | POA: Diagnosis not present

## 2023-03-31 DIAGNOSIS — R188 Other ascites: Secondary | ICD-10-CM

## 2023-03-31 DIAGNOSIS — I4729 Other ventricular tachycardia: Secondary | ICD-10-CM | POA: Diagnosis not present

## 2023-03-31 DIAGNOSIS — I050 Rheumatic mitral stenosis: Secondary | ICD-10-CM

## 2023-03-31 DIAGNOSIS — I5042 Chronic combined systolic (congestive) and diastolic (congestive) heart failure: Secondary | ICD-10-CM

## 2023-03-31 LAB — BASIC METABOLIC PANEL
Anion gap: 8 (ref 5–15)
BUN: 35 mg/dL — ABNORMAL HIGH (ref 6–20)
CO2: 20 mmol/L — ABNORMAL LOW (ref 22–32)
Calcium: 8.6 mg/dL — ABNORMAL LOW (ref 8.9–10.3)
Chloride: 105 mmol/L (ref 98–111)
Creatinine, Ser: 1.82 mg/dL — ABNORMAL HIGH (ref 0.44–1.00)
GFR, Estimated: 31 mL/min — ABNORMAL LOW (ref >60)
Glucose, Bld: 87 mg/dL (ref 70–99)
Potassium: 4.3 mmol/L (ref 3.5–5.1)
Sodium: 133 mmol/L — ABNORMAL LOW (ref 135–145)

## 2023-03-31 NOTE — Patient Instructions (Signed)
Paracentesis scheduled for tomorrow, 04/01/23, at 2 PM. Please go to the [facility] 15 minutes before appt time to check in at the registration desk.  Follow up appointment with Dr. Shirlee Latch in 2 weeks  Do the following things EVERYDAY: Weigh yourself in the morning before breakfast. Write it down and keep it in a log. Take your medicines as prescribed Eat low salt foods--Limit salt (sodium) to 2000 mg per day.  Stay as active as you can everyday Limit all fluids for the day to less than 2 liters

## 2023-03-31 NOTE — Progress Notes (Signed)
Patient ID: Anita [name], female    DOB: May 19, 1961, 62 y.o.   MRN: [id number]  Primary cardiologist: Jodelle Red, MD (last seen 10/23) PCP: Doreene Nest, NP (last seen 10/23) HF cardiologist: Marca Ancona, MD (last seen 03/24)  HPI  Anita [name] is a 62 y/o female with a history of CAD, COPD, GERD, multiple sclerosis, pulmonary HTN, severe valvular disease, previous tobacco use and chronic heart failure.   TEE 03/18/23: EF 40% with moderate LAE/ severe RAE and severe MR. No PFO or ASD. Echo 08/22/22: EF of 30-35% along with mild LVH and severe MR/TR. Echo 02/07/21: EF of 40-45% along with severely elevated PA pressure of 69.2 mmHg, moderate LAE, severe MR, mild/ moderate Anita and moderate/severe TR.   LHC done 03/23/20 showed: Hemodynamics: LV end diastolic pressure is moderately elevated. ----Coronary Angiography----- Ost RCA to Dist RCA lesion is 100% stenosed.-The PDA and PL system fills via faint collaterals from the AV groove LCx and LAD septals. Mid LAD-1 lesion is 60% stenosed. Mid LAD-2 lesion is 50% stenosed. Dist LAD lesion is 55% stenosed with 70% stenosed side branch in 2nd Diag. 1st Diag lesion is 70% stenosed. Ost Cx to Prox Cx lesion is 45% stenosed. Prox-MID Cx lesion is 65% stenosed.  MODERATE-SEVERE THREE-VESSEL CAD: 100% proximal RCA occlusion with left-to-right collaterals faintly filling PDA and PL system (AV groove LCx-PL and LAD septal-PDA) Diffuse moderate mid LAD disease with 60% to 50% stenosis. Tandem 50% and 65% proximal and mid LCx with the 65% lesion being the most significant lesion. Moderately elevated LVEDP of 18-20 mmHg  Admitted 09/16/22 due to SVT. Cardiology consult obtained. Given IV adenosine and then amiodarone drip. Lasix gtt. Plan for EP consult. Discharged after 3 days.      She presents today for a HF follow-up visit with a chief complaint of moderate SOB with minimal exertion. Chronic in nature although sees to be  worsening as her ascites is building back up. Has fatigue, abd distention & light-headedness along with this. Denies difficulty sleeping, palpitations, pedal edema, chest pain, cough or overnight weight gain. Not adding any salt to her food and daughter doesn't cook with salt either.  Had a recent TEE done 03/18/23. She has had several paracenteses over the last few months for cardiogenic ascites with the most recent being 03/24.  She has cirrhosis likely secondary to RV failure. Anita [name] has been started.   Past Medical History:  Diagnosis Date   Abnormality of gait 07/26/2013   Acute respiratory failure with hypoxia 03/20/2020   Allergy    Arthritis    Atypical pneumonia 03/22/2020   CAD (coronary artery disease)    a. 03/2020 Cath: LM nl, LAD 60/44m, 55d, D1 70, D2 70, LCX 45ost/p, 65p, RCA 100ost CTO. RPDA fills via collats from dLAD. Inf septal fills via collats from 1st septal, RPAV fills via collats from LPAV, RPL1/2 sev dzs-->med rx.   Chickenpox    Chronic combined systolic (congestive) and diastolic (congestive) heart failure    a. 02/2021 Echo: EF 40-45%, glob HK, gr2 DD. Sev red RV fxn, RVSP 69.98mmHg. Mod dil LA, sev dil RA. Sev MR. Mild to mod Anita. Mean MV grad 6.102mmHg. Mod-Sev TR. Mild AI. Mild Ao sclerosis w/o stenosis.   COPD (chronic obstructive pulmonary disease)    Elevated troponin 03/22/2020   GERD (gastroesophageal reflux disease)    Headache(784.0)    Migraine   Hearing loss    Right ear secondary to infection   History of shingles  Ischemic cardiomyopathy    a. 02/2021 Echo: EF 40-45%, glob HK.   Migraines    Multiple sclerosis    Obese    Optic neuritis    PAH (pulmonary artery hypertension)    a. 02/2021 Echo: RVSP 69.43mmHg.   Prolonged QT interval 03/20/2020   Severe mitral regurgitation    a. 02/2021 Echo: Severe MR w/ mild to mod Anita.  Mean MV grad 6.20mmHg.   Past Surgical History:  Procedure Laterality Date   COLONOSCOPY WITH PROPOFOL N/A 08/03/2020    Procedure: COLONOSCOPY WITH PROPOFOL;  Surgeon: Wyline Mood, MD;  Location: [facility] ENDOSCOPY;  Service: Gastroenterology;  Laterality: N/A;   Ganglioneuroma     Resection   LEFT HEART CATH AND CORONARY ANGIOGRAPHY N/A 03/23/2020   Procedure: LEFT HEART CATH AND CORONARY ANGIOGRAPHY;  Surgeon: Marykay Lex, MD;  Location: [facility] INVASIVE CV LAB;  Service: Cardiovascular;  Laterality: N/A;   PILONIDAL CYST EXCISION     PILONIDAL CYST EXCISION  1983   TEE WITHOUT CARDIOVERSION N/A 03/18/2023   Procedure: TRANSESOPHAGEAL ECHOCARDIOGRAM (TEE);  Surgeon: Laurey Morale, MD;  Location: [facility] ORS;  Service: Cardiovascular;  Laterality: N/A;   TUMOR REMOVAL  2007/2008   Family History  Problem Relation Age of Onset   Skin cancer Mother    Lung cancer Father    Uterine cancer Sister    Heart disease Brother    Bipolar disorder Sister    Throat cancer Paternal Grandmother    Social History   Tobacco Use   Smoking status: Former    Packs/day: 1    Types: Cigarettes   Smokeless tobacco: Never  Substance Use Topics   Alcohol use: Not Currently    Comment: Occasional   Allergies  Allergen Reactions   Acthar Hp [Corticotropin] Other (See Comments)    Throat swelling and coughing up blood   Codeine    Prednisone    Prior to Admission medications   Medication Sig Start Date End Date Taking? Authorizing Provider  amiodarone (PACERONE) 200 MG tablet Take 1 tablet (200 mg total) by mouth 2 (two) times daily. Patient taking differently: Take 200 mg by mouth daily. 09/22/22  Yes Alver Sorrow, NP  aspirin EC 81 MG tablet Take 1 tablet (81 mg total) by mouth daily. Swallow whole. 09/20/22  Yes Lynn Ito, MD  atorvastatin (LIPITOR) 20 MG tablet TAKE 1 TABLET BY MOUTH EVERYDAY AT BEDTIME 03/17/23  Yes Jodelle Red, MD  dapagliflozin propanediol (FARXIGA) 10 MG TABS tablet Take 1 tablet (10 mg total) by mouth daily before breakfast. 02/12/23  Yes Laurey Morale, MD  DULoxetine  (CYMBALTA) 20 MG capsule TAKE 1 CAPSULE (20 MG TOTAL) BY MOUTH DAILY. FOR ANXIETY AND PAIN. 12/11/22  Yes Doreene Nest, NP  feeding supplement, ENSURE ENLIVE, (ENSURE ENLIVE) LIQD Take 237 mLs by mouth 2 (two) times daily between meals. 03/28/20  Yes Vassie Loll, MD  ferrous sulfate 325 (65 FE) MG EC tablet Take 1 tablet (325 mg total) by mouth daily. 10/01/22  Yes Rickard Patience, MD  fluticasone-salmeterol (ADVAIR DISKUS) 250-50 MCG/ACT AEPB INHALE 1 PUFF INTO THE LUNGS TWICE A DAY 12/02/21  Yes Doreene Nest, NP  metoprolol succinate (TOPROL-XL) 25 MG 24 hr tablet Take 1 tablet (25 mg total) by mouth daily. 01/12/23  Yes Alver Sorrow, NP  nitroGLYCERIN (NITROSTAT) 0.4 MG SL tablet Place 1 tablet (0.4 mg total) under the tongue every 5 (five) minutes as needed for chest pain. 08/02/21  Yes Clarisa Kindred  A, FNP  omeprazole (PRILOSEC) 40 MG capsule Take 1 capsule (40 mg total) by mouth daily. For heartburn. 01/04/23  Yes Doreene Nest, NP  Pediatric Multiple Vitamins (FLINTSTONES MULTIVITAMIN) CHEW Chew by mouth.   Yes [provider]  potassium chloride SA (KLOR-CON M20) 20 MEQ tablet Take 2 tablets (40 mEq total) by mouth 2 (two) times daily. 02/12/23  Yes Laurey Morale, MD  Probiotic Product (PROBIOTIC ADVANCED) CAPS Take 1 capsule by mouth daily.   Yes [provider]  spironolactone (ALDACTONE) 25 MG tablet Take 1 tablet (25 mg total) by mouth daily. 01/01/23  Yes Laurey Morale, MD  torsemide (DEMADEX) 20 MG tablet Take 2 tablets (40 mg total) by mouth 2 (two) times daily. 02/12/23  Yes Laurey Morale, MD  trolamine salicylate (ASPERCREME) 10 % cream Apply 1 application. topically daily as needed for muscle pain (back or joint pain).   Yes [provider]  vitamin B-12 (CYANOCOBALAMIN) 500 MCG tablet Take 500 mcg by mouth 4 (four) times a week. 4 times weekly no more than 2000 MCG per week   Yes [provider]  albuterol (VENTOLIN HFA) 108 (90  Base) MCG/ACT inhaler INHALE 1-2 PUFFS INTO THE LUNGS EVERY 6 (SIX) HOURS AS NEEDED FOR WHEEZING OR SHORTNESS OF BREATH. USE SPARINGLY! Patient not taking: Reported on 03/31/2023 11/13/21   Doreene Nest, NP   Review of Systems  Constitutional:  Positive for fatigue. Negative for appetite change.  HENT:  Negative for congestion, postnasal drip and sore throat.   Eyes: Negative.   Respiratory:  Positive for shortness of breath (worse at times). Negative for cough and chest tightness.   Cardiovascular:  Negative for chest pain, palpitations and leg swelling.  Gastrointestinal:  Positive for abdominal distention (worsening). Negative for abdominal pain.  Endocrine: Negative.   Genitourinary: Negative.   Musculoskeletal:  Positive for back pain. Negative for neck pain.  Skin: Negative.   Allergic/Immunologic: Negative.   Neurological:  Positive for light-headedness (always). Negative for dizziness.  Hematological:  Negative for adenopathy. Does not bruise/bleed easily.  Psychiatric/Behavioral:  Negative for dysphoric mood and sleep disturbance (sleeping on 2-3 pillows).    Vitals:   03/31/23 1252  BP: 122/78  Pulse: 66  Resp: 16  SpO2: 100%  Weight: 140 lb 4 oz (63.6 kg)   Wt Readings from Last 3 Encounters:  03/31/23 140 lb 4 oz (63.6 kg)  03/18/23 140 lb (63.5 kg)  02/12/23 147 lb (66.7 kg)   Lab Results  Component Value Date   CREATININE 1.93 (H) 02/12/2023   CREATININE 1.67 (H) 01/01/2023   CREATININE 1.68 (H) 01/01/2023   Physical Exam Vitals and nursing note reviewed. Exam conducted with a chaperone present (daughter).  Constitutional:      Appearance: Normal appearance.  HENT:     Head: Normocephalic and atraumatic.  Cardiovascular:     Rate and Rhythm: Normal rate and regular rhythm.     Heart sounds: Murmur (II/VI) heard.  Pulmonary:     Effort: Pulmonary effort is normal. No respiratory distress.     Breath sounds: No wheezing or rales.  Abdominal:      General: There is distension.     Tenderness: There is no abdominal tenderness.  Musculoskeletal:        General: No tenderness.     Right lower leg: No edema.     Left lower leg: No edema.  Skin:    General: Skin is warm and dry.  Neurological:  General: No focal deficit present.     Mental Status: She is alert and oriented to person, place, and time. Mental status is at baseline.  Psychiatric:        Mood and Affect: Mood normal.        Behavior: Behavior normal.        Thought Content: Thought content normal.    Assessment & Plan:  1: Chronic heart failure with reduced ejection fraction- - NYHA class III - euvolemic today - weighing daily; reminded to call for an overnight weight gain of > 2 pounds or a weekly weight gain of > 5 pounds - weight down 7 pounds since her last visit here 6 weeks ago - TEE 03/18/23: EF 40% with moderate LAE/ severe RAE and severe MR. No PFO or ASD. Echo 08/22/22: EF of 30-35% along with mild LVH and severe MR/TR. Echo 02/07/21: EF of 40-45% along with severely elevated PA pressure of 69.2 mmHg, moderate LAE, severe MR, mild/ moderate Anita and moderate/severe TR.  - not adding salt and daughter says that she doesn't cook with salt either - saw cardiology Dan Humphreys) 10/23 - saw [facility] provider Shirlee Latch) 03/24 - continue metoprolol succinate 25mg  daily - continue farxiga 10mg  daily - continue spironolactone 25mg  daily - continue torsemide 40mg  BID/ potassium BID - had been hypotensive with entresto in the past - BMP today - palliative care visit done 09/10/22 - BNP 02/12/23 was 3137.0  2: HTN- - BP 122/78 - saw PCP Chestine Spore) 10/23 - BMP 02/12/23 reviewed and showed sodium 137, potassium 3.8, creatinine 1.93 & GFR 29   3: Valvular disease- - LHC done 03/23/20 showed: Hemodynamics: LV end diastolic pressure is moderately elevated. ----Coronary Angiography----- Ost RCA to Dist RCA lesion is 100% stenosed.-The PDA and PL system fills via faint collaterals  from the AV groove LCx and LAD septals. Mid LAD-1 lesion is 60% stenosed. Mid LAD-2 lesion is 50% stenosed. Dist LAD lesion is 55% stenosed with 70% stenosed side branch in 2nd Diag. 1st Diag lesion is 70% stenosed. Ost Cx to Prox Cx lesion is 45% stenosed. Prox-MID Cx lesion is 65% stenosed.  MODERATE-SEVERE THREE-VESSEL CAD: 100% proximal RCA occlusion with left-to-right collaterals faintly filling PDA and PL system (AV groove LCx-PL and LAD septal-PDA) Diffuse moderate mid LAD disease with 60% to 50% stenosis. Tandem 50% and 65% proximal and mid LCx with the 65% lesion being the most significant lesion. Moderately elevated LVEDP of 18-20 mmHg - TEE 03/18/23: EF 40% with moderate LAE/ severe RAE and severe MR. No PFO or ASD - to be discussed with structural heart team to see if she qualifies for mTEER; if not her HF will continue to progress  4: NSVT- - continue amiodarone 200mg  daily; may decrease to 100mg  in the future if no symptomatic SVT - required adenosine to terminate 10/23 - needs regular eye exams  5: Cirrhosis w/ ascites- - suspect cardiogenic cirrhosis due to RV failure - periodic paracentesis; last one done 03/24 (6 weeks ago)with removal of 8.5L; If the patient eventually requires >/=2 paracenteses in a 30 day period, candidacy for formal evaluation by the [facility] will be assessed - have scheduled IR paracentesis for tomorrow - saw GI Tobi Bastos) 01/24  Return in 2 weeks, sooner if needed.

## 2023-04-01 ENCOUNTER — Ambulatory Visit: Payer: Medicare Other | Admitting: Oncology

## 2023-04-01 ENCOUNTER — Ambulatory Visit
Admission: RE | Admit: 2023-04-01 | Discharge: 2023-04-01 | Disposition: A | Discharge status: Home / Self Care | Payer: Medicare Other | Source: Ambulatory Visit | Attending: Family | Admitting: Family

## 2023-04-01 DIAGNOSIS — R188 Other ascites: Secondary | ICD-10-CM | POA: Insufficient documentation

## 2023-04-01 DIAGNOSIS — I509 Heart failure, unspecified: Secondary | ICD-10-CM | POA: Diagnosis not present

## 2023-04-01 MED ORDER — LIDOCAINE HCL (PF) 1 % IJ SOLN
10.0000 mL | Freq: Once | INTRAMUSCULAR | Status: AC
  Administered 2023-04-01: 10 mL via SUBCUTANEOUS
  Filled 2023-04-01: qty 10

## 2023-04-01 NOTE — Procedures (Signed)
PROCEDURE SUMMARY:  Successful US guided therapeutic paracentesis from RLQ.  Yielded 6.2 L of clear, yellow fluid.  No immediate complications.  Pt tolerated well.   Specimen not sent for labs.  EBL < 1 mL  Shon Hough, AGNP 04/01/2023 4:03 PM

## 2023-04-03 ENCOUNTER — Telehealth: Payer: Self-pay

## 2023-04-03 NOTE — Telephone Encounter (Signed)
1245 Palliative Care Note.  RN called and received a message stating "phone not accepting calls at this time. Please try your call again later."  This is attempt #1  Barbette Merino, RN

## 2023-04-08 ENCOUNTER — Other Ambulatory Visit: Payer: Self-pay | Admitting: Primary Care

## 2023-04-08 DIAGNOSIS — J439 Emphysema, unspecified: Secondary | ICD-10-CM

## 2023-04-09 ENCOUNTER — Telehealth: Payer: Self-pay

## 2023-04-09 NOTE — Telephone Encounter (Signed)
1549 Palliative Care Note  RN called for palliative check in. No answer. LVM with contact info and request to return call.  This is attempt # 2  Barbette Merino, RN

## 2023-04-15 ENCOUNTER — Inpatient Hospital Stay: Payer: Medicare Other | Attending: Oncology

## 2023-04-15 DIAGNOSIS — Z885 Allergy status to narcotic agent status: Secondary | ICD-10-CM | POA: Insufficient documentation

## 2023-04-15 DIAGNOSIS — R5383 Other fatigue: Secondary | ICD-10-CM | POA: Diagnosis not present

## 2023-04-15 DIAGNOSIS — R110 Nausea: Secondary | ICD-10-CM | POA: Insufficient documentation

## 2023-04-15 DIAGNOSIS — I255 Ischemic cardiomyopathy: Secondary | ICD-10-CM | POA: Diagnosis not present

## 2023-04-15 DIAGNOSIS — R188 Other ascites: Secondary | ICD-10-CM | POA: Insufficient documentation

## 2023-04-15 DIAGNOSIS — Z87891 Personal history of nicotine dependence: Secondary | ICD-10-CM | POA: Diagnosis not present

## 2023-04-15 DIAGNOSIS — Z8049 Family history of malignant neoplasm of other genital organs: Secondary | ICD-10-CM | POA: Insufficient documentation

## 2023-04-15 DIAGNOSIS — Z801 Family history of malignant neoplasm of trachea, bronchus and lung: Secondary | ICD-10-CM | POA: Diagnosis not present

## 2023-04-15 DIAGNOSIS — K648 Other hemorrhoids: Secondary | ICD-10-CM | POA: Insufficient documentation

## 2023-04-15 DIAGNOSIS — Z8249 Family history of ischemic heart disease and other diseases of the circulatory system: Secondary | ICD-10-CM | POA: Diagnosis not present

## 2023-04-15 DIAGNOSIS — K7460 Unspecified cirrhosis of liver: Secondary | ICD-10-CM | POA: Diagnosis not present

## 2023-04-15 DIAGNOSIS — G35 Multiple sclerosis: Secondary | ICD-10-CM | POA: Diagnosis not present

## 2023-04-15 DIAGNOSIS — I5042 Chronic combined systolic (congestive) and diastolic (congestive) heart failure: Secondary | ICD-10-CM | POA: Diagnosis not present

## 2023-04-15 DIAGNOSIS — Z818 Family history of other mental and behavioral disorders: Secondary | ICD-10-CM | POA: Diagnosis not present

## 2023-04-15 DIAGNOSIS — Z888 Allergy status to other drugs, medicaments and biological substances status: Secondary | ICD-10-CM | POA: Diagnosis not present

## 2023-04-15 DIAGNOSIS — J449 Chronic obstructive pulmonary disease, unspecified: Secondary | ICD-10-CM | POA: Diagnosis not present

## 2023-04-15 DIAGNOSIS — N1831 Chronic kidney disease, stage 3a: Secondary | ICD-10-CM | POA: Insufficient documentation

## 2023-04-15 DIAGNOSIS — I252 Old myocardial infarction: Secondary | ICD-10-CM | POA: Insufficient documentation

## 2023-04-15 DIAGNOSIS — Z79899 Other long term (current) drug therapy: Secondary | ICD-10-CM | POA: Insufficient documentation

## 2023-04-15 DIAGNOSIS — Z808 Family history of malignant neoplasm of other organs or systems: Secondary | ICD-10-CM | POA: Diagnosis not present

## 2023-04-15 DIAGNOSIS — E538 Deficiency of other specified B group vitamins: Secondary | ICD-10-CM | POA: Diagnosis not present

## 2023-04-15 DIAGNOSIS — K219 Gastro-esophageal reflux disease without esophagitis: Secondary | ICD-10-CM | POA: Diagnosis not present

## 2023-04-15 DIAGNOSIS — D631 Anemia in chronic kidney disease: Secondary | ICD-10-CM | POA: Diagnosis not present

## 2023-04-15 LAB — CBC WITH DIFFERENTIAL/PLATELET
Abs Immature Granulocytes: 0.03 10*3/uL (ref 0.00–0.07)
Basophils Absolute: 0 10*3/uL (ref 0.0–0.1)
Basophils Relative: 1 %
Eosinophils Absolute: 0.1 10*3/uL (ref 0.0–0.5)
Eosinophils Relative: 2 %
HCT: 32.9 % — ABNORMAL LOW (ref 36.0–46.0)
Hemoglobin: 9.7 g/dL — ABNORMAL LOW (ref 12.0–15.0)
Immature Granulocytes: 0 %
Lymphocytes Relative: 5 %
Lymphs Abs: 0.4 10*3/uL — ABNORMAL LOW (ref 0.7–4.0)
MCH: 27.6 pg (ref 26.0–34.0)
MCHC: 29.5 g/dL — ABNORMAL LOW (ref 30.0–36.0)
MCV: 93.5 fL (ref 80.0–100.0)
Monocytes Absolute: 0.7 10*3/uL (ref 0.1–1.0)
Monocytes Relative: 10 %
Neutro Abs: 5.8 10*3/uL (ref 1.7–7.7)
Neutrophils Relative %: 82 %
Platelets: 201 10*3/uL (ref 150–400)
RBC: 3.52 MIL/uL — ABNORMAL LOW (ref 3.87–5.11)
RDW: 19.3 % — ABNORMAL HIGH (ref 11.5–15.5)
WBC: 7 10*3/uL (ref 4.0–10.5)
nRBC: 0 % (ref 0.0–0.2)

## 2023-04-15 LAB — VITAMIN B12: Vitamin B-12: 607 pg/mL (ref 180–914)

## 2023-04-15 LAB — IRON AND TIBC
Iron: 33 ug/dL (ref 28–170)
Saturation Ratios: 9 % — ABNORMAL LOW (ref 10.4–31.8)
TIBC: 351 ug/dL (ref 250–450)
UIBC: 318 ug/dL

## 2023-04-15 LAB — FERRITIN: Ferritin: 54 ng/mL (ref 11–307)

## 2023-04-17 ENCOUNTER — Ambulatory Visit (HOSPITAL_BASED_OUTPATIENT_CLINIC_OR_DEPARTMENT_OTHER): Payer: Medicare Other | Admitting: Cardiology

## 2023-04-17 ENCOUNTER — Other Ambulatory Visit: Payer: Medicare Other

## 2023-04-17 ENCOUNTER — Other Ambulatory Visit
Admission: RE | Admit: 2023-04-17 | Discharge: 2023-04-17 | Disposition: A | Discharge status: Home / Self Care | Payer: Medicare Other | Source: Ambulatory Visit | Attending: Cardiology | Admitting: Cardiology

## 2023-04-17 VITALS — BP 114/68 | HR 66 | Wt 135.6 lb

## 2023-04-17 DIAGNOSIS — I255 Ischemic cardiomyopathy: Secondary | ICD-10-CM

## 2023-04-17 DIAGNOSIS — I5042 Chronic combined systolic (congestive) and diastolic (congestive) heart failure: Secondary | ICD-10-CM | POA: Diagnosis not present

## 2023-04-17 DIAGNOSIS — R188 Other ascites: Secondary | ICD-10-CM | POA: Diagnosis not present

## 2023-04-17 LAB — COMPREHENSIVE METABOLIC PANEL
ALT: 17 U/L (ref 0–44)
AST: 23 U/L (ref 15–41)
Albumin: 3.4 g/dL — ABNORMAL LOW (ref 3.5–5.0)
Alkaline Phosphatase: 100 U/L (ref 38–126)
Anion gap: 6 (ref 5–15)
BUN: 32 mg/dL — ABNORMAL HIGH (ref 6–20)
CO2: 20 mmol/L — ABNORMAL LOW (ref 22–32)
Calcium: 8.5 mg/dL — ABNORMAL LOW (ref 8.9–10.3)
Chloride: 109 mmol/L (ref 98–111)
Creatinine, Ser: 1.78 mg/dL — ABNORMAL HIGH (ref 0.44–1.00)
GFR, Estimated: 32 mL/min — ABNORMAL LOW (ref >60)
Glucose, Bld: 81 mg/dL (ref 70–99)
Potassium: 4.9 mmol/L (ref 3.5–5.1)
Sodium: 135 mmol/L (ref 135–145)
Total Bilirubin: 0.7 mg/dL (ref 0.3–1.2)
Total Protein: 7.3 g/dL (ref 6.5–8.1)

## 2023-04-17 LAB — BRAIN NATRIURETIC PEPTIDE: B Natriuretic Peptide: 2260.8 pg/mL — ABNORMAL HIGH (ref 0.0–100.0)

## 2023-04-17 LAB — TSH: TSH: 13.298 u[IU]/mL — ABNORMAL HIGH (ref 0.350–4.500)

## 2023-04-17 MED ORDER — TORSEMIDE 20 MG PO TABS
ORAL_TABLET | ORAL | 3 refills | Status: DC
Start: 2023-04-17 — End: 2023-05-08

## 2023-04-17 MED ORDER — AMIODARONE HCL 200 MG PO TABS: 100.0000 mg | ORAL_TABLET | Freq: Every day | ORAL | 3 refills | Status: DC

## 2023-04-17 MED ORDER — SPIRONOLACTONE 25 MG PO TABS: 25.0000 mg | ORAL_TABLET | Freq: Two times a day (BID) | ORAL | 3 refills | Status: DC

## 2023-04-17 NOTE — Patient Instructions (Addendum)
INCREASE Spironolactone to 25mg  daily  DECREASE Amiodarone to 100mg  daily  INCREASE Torsemide to 60mg  in the morning and 40mg  in  the evening  Routine lab work today. Will notify you of abnormal results  Repeat labs in 10days  Your provider requests you have a paracentesis  (We will call you to schedule that appointment)  Follow up in 3 weeks  Do the following things EVERYDAY: Weigh yourself in the morning before breakfast. Write it down and keep it in a log. Take your medicines as prescribed Eat low salt foods--Limit salt (sodium) to 2000 mg per day.  Stay as active as you can everyday Limit all fluids for the day to less than 2 liters

## 2023-04-19 NOTE — Progress Notes (Signed)
PCP: Doreene Nest, NP HF Cardiology: Dr. Shirlee Latch  62 y.o. with history of multiple sclerosis, CAD, chronic systolic CHF, congestive hepatopathy and valvular heart disease was referred by Clarisa Kindred for CHF evaluation.  Last echo in 9/23 showed EF 30-35%, mild LVH, severe RV dysfunction with moderate RV enlargement, severe MR, severe TR.  She has been struggling with volume overload including cardiogenic ascites for months. She has multiple sclerosis, secondary progressive. She walks with a rolling walker and has significant left leg weakness.  She has slurred speech and some visual difficulty.  She was deemed not a surgical candidate for her valves, and she was thought to be too high risk for TEE.  Her last cath was in 4/21 and showed occluded RHC with collaterals.  There was moderate disease in the LAD and LCx not thought to be hemodynamically significant, medically managed. She has had several paracenteses over the last couple of months for ascites.  She has cirrhosis likely secondary to RV failure. Most recent admission was in 10/23 with SVT requiring adenosine and volume overload treated with Lasix gtt.   Patient had TEE in 4/24 showing EF 40%, moderate RV enlargement with moderate systolic dysfunction, severe functional MR with restricted posterior leaflet, peak RV-RA gradient 48 mmHg, severe TR.    Patient returns for followup of CHF.  She had paracenteses in 3/24 and 4/24, most recently with removal of 6.2 L.  She continues to have significant orthopnea.  Mild dyspnea walking around her house. Abdominal distention is worsening.  No PND.  Occasional lightheadedness if she stands too fast.  No chest pain.  Weight down 12 lbs, poor appetite.   Labs (1/24): K 4.2, creatinine 1.69, AST/ALT/bilirubin normal, LDL 56 Labs (4/24): K 4.3, creatinine 1.82 Labs (5/24): hgb 9.7, plts 201  PMH: 1. CAD: LHC in 4/21 with 60% mLAD, 70% D1, 65% mLCx, occluded proximal RCA with collaterals. Medically  managed.  2. COPD 3. H/o SVT 4. Multiple sclerosis: Secondary progressive.  5. Anemia of CKD 6. CKD stage 3 7. B12 deficiency.  8. Chronic systolic CHF: Mixed ischemic and valvular cardiomyopathy.   - Echo (9/23): EF 30-35%, mild LVH, severe RV dysfunction with moderate RV enlargement, severe MR, severe TR.  - TEE (4/24): EF 40%, moderate RV enlargement with moderate systolic dysfunction, severe functional MR with restricted posterior leaflet, peak RV-RA gradient 48 mmHg, severe TR. 9. Valvular heart disease: Severe MR and severe TR on 9/23 echo. Not candidate for surgical valve replacement.  - TEE (4/24): EF 40%, moderate RV enlargement with moderate systolic dysfunction, severe functional MR with restricted posterior leaflet, peak RV-RA gradient 48 mmHg, severe TR. 10. Cirrhosis: Suspect due to RV failure/hepatic congestion.  - H/o paracenteses.   Social History   Socioeconomic History   Marital status: Divorced    Spouse name: Not on file   Number of children: 2   Years of education: Not on file   Highest education level: Not on file  Occupational History   Not on file  Tobacco Use   Smoking status: Former    Packs/day: 1    Types: Cigarettes   Smokeless tobacco: Never  Vaping Use   Vaping Use: Never used  Substance and Sexual Activity   Alcohol use: Not Currently    Comment: Occasional   Drug use: Never   Sexual activity: Not on file  Other Topics Concern   Not on file  Social History Narrative   Right handed    Lives in one  story home   Drinks Caffeine - Sweet Tea    Social Determinants of Health   Financial Resource Strain: Low Risk  (07/08/2022)   Overall Financial Resource Strain (CARDIA)    Difficulty of Paying Living Expenses: Not hard at all  Food Insecurity: No Food Insecurity (09/17/2022)   Hunger Vital Sign    Worried About Running Out of Food in the Last Year: Never true    Ran Out of Food in the Last Year: Never true  Transportation Needs: No  Transportation Needs (09/17/2022)   PRAPARE - Administrator, Civil Service (Medical): No    Lack of Transportation (Non-Medical): No  Physical Activity: Inactive (07/08/2022)   Exercise Vital Sign    Days of Exercise per Week: 0 days    Minutes of Exercise per Session: 0 min  Stress: No Stress Concern Present (07/08/2022)   [employer] of Occupational Health - Occupational Stress Questionnaire    Feeling of Stress : Not at all  Social Connections: Socially Isolated (07/08/2022)   Social Connection and Isolation Panel [NHANES]    Frequency of Communication with Friends and Family: Three times a week    Frequency of Social Gatherings with Friends and Family: Never    Attends Religious Services: Never    Database administrator or Organizations: No    Attends Banker Meetings: Never    Marital Status: Divorced  Catering manager Violence: Not At Risk (09/17/2022)   Humiliation, Afraid, Rape, and Kick questionnaire    Fear of Current or Ex-Partner: No    Emotionally Abused: No    Physically Abused: No    Sexually Abused: No   Family History  Problem Relation Age of Onset   Skin cancer Mother    Lung cancer Father    Uterine cancer Sister    Heart disease Brother    Bipolar disorder Sister    Throat cancer Paternal Grandmother    ROS: All systems reviewed and negative except as per HPI.   Current Outpatient Medications  Medication Sig Dispense Refill   albuterol (VENTOLIN HFA) 108 (90 Base) MCG/ACT inhaler INHALE 1-2 PUFFS INTO THE LUNGS EVERY 6 (SIX) HOURS AS NEEDED FOR WHEEZING OR SHORTNESS OF BREATH. USE SPARINGLY! 18 each 0   aspirin EC 81 MG tablet Take 1 tablet (81 mg total) by mouth daily. Swallow whole. 30 tablet 0   atorvastatin (LIPITOR) 20 MG tablet TAKE 1 TABLET BY MOUTH EVERYDAY AT BEDTIME 90 tablet 1   dapagliflozin propanediol (FARXIGA) 10 MG TABS tablet Take 1 tablet (10 mg total) by mouth daily before breakfast. 30 tablet 11   DULoxetine  (CYMBALTA) 20 MG capsule TAKE 1 CAPSULE (20 MG TOTAL) BY MOUTH DAILY. FOR ANXIETY AND PAIN. 90 capsule 2   feeding supplement, ENSURE ENLIVE, (ENSURE ENLIVE) LIQD Take 237 mLs by mouth 2 (two) times daily between meals.     ferrous sulfate 325 (65 FE) MG EC tablet Take 1 tablet (325 mg total) by mouth daily. 60 tablet 3   fluticasone-salmeterol (ADVAIR DISKUS) 250-50 MCG/ACT AEPB INHALE 1 PUFF INTO THE LUNGS TWICE A DAY 60 each 5   metoprolol succinate (TOPROL-XL) 25 MG 24 hr tablet Take 1 tablet (25 mg total) by mouth daily. 90 tablet 1   nitroGLYCERIN (NITROSTAT) 0.4 MG SL tablet Place 1 tablet (0.4 mg total) under the tongue every 5 (five) minutes as needed for chest pain. 20 tablet 5   omeprazole (PRILOSEC) 40 MG capsule Take 1 capsule (40  mg total) by mouth daily. For heartburn. 90 capsule 1   Pediatric Multiple Vitamins (FLINTSTONES MULTIVITAMIN) CHEW Chew by mouth.     potassium chloride SA (KLOR-CON M20) 20 MEQ tablet Take 2 tablets (40 mEq total) by mouth 2 (two) times daily. 120 tablet 3   Probiotic Product (PROBIOTIC ADVANCED) CAPS Take 1 capsule by mouth daily.     vitamin B-12 (CYANOCOBALAMIN) 500 MCG tablet Take 500 mcg by mouth 4 (four) times a week. 4 times weekly no more than 2000 MCG per week     amiodarone (PACERONE) 200 MG tablet Take 0.5 tablets (100 mg total) by mouth daily. 180 tablet 3   spironolactone (ALDACTONE) 25 MG tablet Take 1 tablet (25 mg total) by mouth 2 (two) times daily. 180 tablet 3   torsemide (DEMADEX) 20 MG tablet Take 60mg  every morning and 40 mg every evening 120 tablet 3   trolamine salicylate (ASPERCREME) 10 % cream Apply 1 application. topically daily as needed for muscle pain (back or joint pain). (Patient not taking: Reported on 04/17/2023)     Current Facility-Administered Medications  Medication Dose Route Frequency Provider Last Rate Last Admin   albumin human 25 % solution 25 g  25 g Intravenous Once Wyline Mood, MD       BP 114/68   Pulse 66    Wt 135 lb 9.6 oz (61.5 kg)   SpO2 100%   BMI 24.02 kg/m  General: NAD Neck: JVP 10-11, no thyromegaly or thyroid nodule.  Lungs: Clear to auscultation bilaterally with normal respiratory effort. CV: Nondisplaced PMI.  Heart regular S1/S2, no S3/S4, 3/6 HSM LLSB/apex.  No peripheral edema.  No carotid bruit.  Normal pedal pulses.  Abdomen: Soft, nontender, no hepatosplenomegaly, moderate distention.  Skin: Intact without lesions or rashes.  Neurologic: Alert and oriented x 3.  Psych: Normal affect. Extremities: No clubbing or cyanosis.  HEENT: Normal.   Assessment/Plan: 1. CAD: Cath in 4/21 with occluded RCA with collaterals and moderate LAD, LCx disease.  No chest pain.  - Continue atorvastatin, good lipids in 1/24.  - Continue ASA 81 daily.  2. SVT: She has been on amiodarone for this.  Required adenosine to terminate at 10/23 admission.   - Keep amiodarone minimal with cirrhosis, decrease to 100 mg daily in the future given no further symptomatic SVT. Check LFTs and TSH, she will need regular eye exam.  3. Chronic systolic CHF: Suspect mixed ischemic and valvular cardiomyopathy. Echo in 9/23 showed EF 30-35%, mild LVH, severe RV dysfunction with moderate RV enlargement, severe MR, severe TR. TEE in 4/24 with EF 40%, moderate RV enlargement with moderate systolic dysfunction, severe functional MR with restricted posterior leaflet, peak RV-RA gradient 48 mmHg, severe TR.  She remains volume overloaded with NYHA class III symptoms.   - Increase torsemide to 60 qam/40 qpm with BMET/BNP today and in 10 days.  - Increase spironolactone to 25 mg bid with cardiomyopathy and ascites.  - Farxiga 10 mg daily.  - She became hypotensive on Entresto in the past.  - Continue Toprol XL 25 mg daily.  4. Cirrhosis with ascites: Suspect cardiogenic cirrhosis due to RV failure.   She again has significant ascites on exam with tight abdomen.  - I will arrange for IR-guided paracentesis.  - Increase  spironolactone as above.   - Follows with GI.  5. Valvular heart disease: Severe MR and severe TR, functional based on TEE. She is not a candidate for open heart surgery with cirrhosis, poor  mobility (MS), and frailty.   I discussed her situation with structural heart colleagues.  She could potentially have mTEER but would be very high risk for anesthesia.  This would also not fix her TR which is probably as much or more of a problem for her. Therefore, we thought that the risk/benefit ratio does not favor mTEER. Unfortunately, I suspect her CHF will continue to progress.  We briefly discussed eventual hospice referral.  6. CKD stage 3: BMET today.   Followup in 3 wks.    Marca Ancona 04/19/2023

## 2023-04-20 ENCOUNTER — Encounter: Payer: Self-pay | Admitting: Oncology

## 2023-04-20 ENCOUNTER — Telehealth (HOSPITAL_COMMUNITY): Payer: Self-pay

## 2023-04-20 ENCOUNTER — Inpatient Hospital Stay (HOSPITAL_BASED_OUTPATIENT_CLINIC_OR_DEPARTMENT_OTHER): Payer: Medicare Other | Admitting: Oncology

## 2023-04-20 ENCOUNTER — Telehealth: Payer: Self-pay

## 2023-04-20 VITALS — BP 120/61 | HR 68 | Temp 97.5°F | Resp 18 | Wt 132.3 lb

## 2023-04-20 DIAGNOSIS — K7460 Unspecified cirrhosis of liver: Secondary | ICD-10-CM | POA: Diagnosis not present

## 2023-04-20 DIAGNOSIS — R188 Other ascites: Secondary | ICD-10-CM

## 2023-04-20 DIAGNOSIS — I5042 Chronic combined systolic (congestive) and diastolic (congestive) heart failure: Secondary | ICD-10-CM

## 2023-04-20 DIAGNOSIS — N1831 Chronic kidney disease, stage 3a: Secondary | ICD-10-CM | POA: Diagnosis not present

## 2023-04-20 DIAGNOSIS — N1832 Chronic kidney disease, stage 3b: Secondary | ICD-10-CM

## 2023-04-20 DIAGNOSIS — N183 Chronic kidney disease, stage 3 unspecified: Secondary | ICD-10-CM | POA: Insufficient documentation

## 2023-04-20 DIAGNOSIS — D631 Anemia in chronic kidney disease: Secondary | ICD-10-CM | POA: Diagnosis not present

## 2023-04-20 DIAGNOSIS — E538 Deficiency of other specified B group vitamins: Secondary | ICD-10-CM | POA: Diagnosis not present

## 2023-04-20 DIAGNOSIS — I252 Old myocardial infarction: Secondary | ICD-10-CM | POA: Diagnosis not present

## 2023-04-20 NOTE — Telephone Encounter (Signed)
120 pm  Follow up call made to daughter Arline Asp.  No answer.  Message left requesting call back when able to schedule a home visit or offer telephone support.

## 2023-04-20 NOTE — Assessment & Plan Note (Addendum)
#  Anemia, combination of iron deficiency as well as chronic kidney disease. Labs reviewed and discussed with patient. Lab Results  Component Value Date   HGB 9.7 (L) 04/15/2023   TIBC 351 04/15/2023   IRONPCTSAT 9 (L) 04/15/2023   FERRITIN 54 04/15/2023   I discussed about option of IV Venofer.  The potential risks including but not limited to allergic reactions/infusion reactions including anaphylactic reactions, phlebitis, high blood pressure, wheezing, SOB, skin rash, weight gain,dark urine, leg swelling, back pain, headache, nausea and fatigue, etc. Patient and daughter agree with IV Venofer.  Plan IV venofer weekly x 3

## 2023-04-20 NOTE — Assessment & Plan Note (Signed)
continue vitamin B12  3 times per week.

## 2023-04-20 NOTE — Telephone Encounter (Signed)
Orders Placed This Encounter  Procedures   T3, free    Standing Status:   Future    Standing Expiration Date:   04/19/2024    Order Specific Question:   Release to patient    Answer:   Immediate [1]   T4, free    Standing Status:   Future    Standing Expiration Date:   04/19/2024    Order Specific Question:   Release to patient    Answer:   Immediate [1]   TSH    Standing Status:   Future    Standing Expiration Date:   04/19/2024    Order Specific Question:   Release to patient    Answer:   Immediate    Order Specific Question:   Release to patient    Answer:   Immediate [1]

## 2023-04-20 NOTE — Telephone Encounter (Signed)
-----   Message from Laurey Morale, MD sent at 04/17/2023  1:41 PM EDT ----- Arrange for free T3, free T4, and TSH at next blood draw.

## 2023-04-20 NOTE — Assessment & Plan Note (Addendum)
Patient gets periodic therapeutic paracentesis Felt to be due to cardiogenic etiology.  Follow up with cardiology and GI

## 2023-04-20 NOTE — Progress Notes (Signed)
Hematology/Oncology Progress note [phone] [phone]      Patient Care Team: Doreene Nest, NP as PCP - General (Internal Medicine) Jodelle Red, MD as PCP - Cardiology (Cardiology) Rickard Patience, MD as Consulting Physician (Hematology and Oncology) Drema Dallas, DO as Consulting Physician (Neurology)  ASSESSMENT & PLAN:   Anemia due to stage 3a chronic kidney disease (HCC) #Anemia, combination of iron deficiency as well as chronic kidney disease. Labs reviewed and discussed with patient. Lab Results  Component Value Date   HGB 9.7 (L) 04/15/2023   TIBC 351 04/15/2023   IRONPCTSAT 9 (L) 04/15/2023   FERRITIN 54 04/15/2023   I discussed about option of IV Venofer.  The potential risks including but not limited to allergic reactions/infusion reactions including anaphylactic reactions, phlebitis, high blood pressure, wheezing, SOB, skin rash, weight gain,dark urine, leg swelling, back pain, headache, nausea and fatigue, etc. Patient and daughter agree with IV Venofer.  Plan IV venofer weekly x 3  Vitamin B12 deficiency continue vitamin B12  3 times per week.   CKD (chronic kidney disease) stage 3, GFR 30-59 ml/min (HCC) Encourage oral hydration and avoid nephrotoxins.    Cirrhosis of liver with ascites [name]) Patient gets periodic therapeutic paracentesis Felt to be due to cardiogenic etiology.  Follow up with cardiology and GI  Orders Placed This Encounter  Procedures   CBC with Differential (Cancer Center Only)    Standing Status:   Future    Standing Expiration Date:   04/19/2024   Iron and TIBC    Standing Status:   Future    Standing Expiration Date:   04/19/2024   Ferritin    Standing Status:   Future    Standing Expiration Date:   04/19/2024   Follow-up in 3 months. All questions were answered. The patient knows to call the clinic with any problems, questions or concerns.  Rickard Patience, MD, PhD [facility] Hematology  Oncology 04/20/2023    CHIEF COMPLAINTS/REASON FOR VISIT:  Follow up of anemia  HISTORY OF PRESENTING ILLNESS:  Anita [name] is a  62 y.o.  female with PMH listed below who was referred to me for evaluation of anemia Reviewed patient's recent labs that was done.  05/03/2020 labs revealed anemia with hemoglobin of 8.9, MCV 82.9 07/02/2020, iron saturation 11, ferritin 91, TIBC 307, iron 34. Reviewed patient's previous labs , anemia is chronic onset , duration is since April 2021.  She did not have much baseline blood work prior to April 2021.  She did have some remote blood work that was done in 2012 when she had a normal hemoglobin of 13.5. Patient has multiple medical problems.  She is a poor historian.  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 Patient was hospitalized in April 2021 due to acute respiratory failure with hypoxia, due to atypical pneumonia, non-STEMI, severe three-vessel disease and pulmonary hypertension, AKI. 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 Pierce Crane in 2007.  Patient declined pursuing further diagnosis or therapeutic options. She also has a chronic history of MS.  Patient was recently seen by gastroenterology and had colonoscopy done. 5 mm transverse colon polyp which was resected and retrieved.  Diverticulosis.  Nonbleeding internal hemorrhoids. Patient denies any bloody or black bowel movement.  She continues to have chronic nausea with no vomiting. Appetite is not good.  Weight has been relatively stable.  She also  reports bilateral upper extremity ecchymosis which is a chronic issue for her.    INTERVAL HISTORY Anita [name] is a 62 y.o. female who has above history reviewed by me today presents for follow up visit for management of Anemia Patient was accompanied by daughter. + chronic fatigue. She takes oral iron supplementation +  CHF, ascites, she gets therapeutic paracentesis.   Review of Systems  Constitutional:  Positive for fatigue. Negative for appetite change, chills, fever and unexpected weight change.  HENT:   Negative for hearing loss and voice change.   Eyes:  Negative for eye problems.  Respiratory:  Negative for chest tightness and cough.   Cardiovascular:  Negative for chest pain.  Gastrointestinal:  Negative for abdominal distention, abdominal pain, blood in stool and nausea.  Endocrine: Negative for hot flashes.  Genitourinary:  Negative for difficulty urinating and frequency.   Musculoskeletal:  Negative for arthralgias.  Skin:  Negative for itching and rash.  Neurological:  Negative for extremity weakness.  Hematological:  Negative for adenopathy. Bruises/bleeds easily.  Psychiatric/Behavioral:  Negative for confusion.      MEDICAL HISTORY:  Past Medical History:  Diagnosis Date   Abnormality of gait 07/26/2013   Acute respiratory failure with hypoxia (HCC) 03/20/2020   Allergy    Arthritis    Atypical pneumonia 03/22/2020   CAD (coronary artery disease)    a. 03/2020 Cath: LM nl, LAD 60/72m, 55d, D1 70, D2 70, LCX 45ost/p, 65p, RCA 100ost CTO. RPDA fills via collats from dLAD. Inf septal fills via collats from 1st septal, RPAV fills via collats from LPAV, RPL1/2 sev dzs-->med rx.   Chickenpox    Chronic combined systolic (congestive) and diastolic (congestive) heart failure (HCC)    a. 02/2021 Echo: EF 40-45%, glob HK, gr2 DD. Sev red RV fxn, RVSP 69.78mmHg. Mod dil LA, sev dil RA. Sev MR. Mild to mod MS. Mean MV grad 6.32mmHg. Mod-Sev TR. Mild AI. Mild Ao sclerosis w/o stenosis.   COPD (chronic obstructive pulmonary disease) (HCC)    Elevated troponin 03/22/2020   GERD (gastroesophageal reflux disease)    Headache(784.0)    Migraine   Hearing loss    Right ear secondary to infection   History of shingles    Ischemic cardiomyopathy    a. 02/2021 Echo: EF 40-45%, glob HK.   Migraines     Multiple sclerosis (HCC)    Obese    Optic neuritis    PAH (pulmonary artery hypertension) (HCC)    a. 02/2021 Echo: RVSP 69.38mmHg.   Prolonged QT interval 03/20/2020   Severe mitral regurgitation    a. 02/2021 Echo: Severe MR w/ mild to mod MS.  Mean MV grad 6.83mmHg.    SURGICAL HISTORY: Past Surgical History:  Procedure Laterality Date   COLONOSCOPY WITH PROPOFOL N/A 08/03/2020   Procedure: COLONOSCOPY WITH PROPOFOL;  Surgeon: Wyline Mood, MD;  Location: [facility] ENDOSCOPY;  Service: Gastroenterology;  Laterality: N/A;   Ganglioneuroma     Resection   LEFT HEART CATH AND CORONARY ANGIOGRAPHY N/A 03/23/2020   Procedure: LEFT HEART CATH AND CORONARY ANGIOGRAPHY;  Surgeon: Marykay Lex, MD;  Location: [facility] INVASIVE CV LAB;  Service: Cardiovascular;  Laterality: N/A;   PILONIDAL CYST EXCISION     PILONIDAL CYST EXCISION  1983   TEE WITHOUT CARDIOVERSION N/A 03/18/2023   Procedure: TRANSESOPHAGEAL ECHOCARDIOGRAM (TEE);  Surgeon: Laurey Morale, MD;  Location: [facility] ORS;  Service: Cardiovascular;  Laterality: N/A;   TUMOR REMOVAL  2007/2008    SOCIAL HISTORY:  Social History   Socioeconomic History   Marital status: Divorced    Spouse name: Not on file   Number of children: 2   Years of education: Not on file   Highest education level: Not on file  Occupational History   Not on file  Tobacco Use   Smoking status: Former    Packs/day: 1    Types: Cigarettes   Smokeless tobacco: Never  Vaping Use   Vaping Use: Never used  Substance and Sexual Activity   Alcohol use: Not Currently    Comment: Occasional   Drug use: Never   Sexual activity: Not on file  Other Topics Concern   Not on file  Social History Narrative   Right handed    Lives in one story home   Drinks Caffeine - Sweet Tea    Social Determinants of Health   Financial Resource Strain: Low Risk  (07/08/2022)   Overall Financial Resource Strain (CARDIA)    Difficulty of Paying Living Expenses: Not hard at all  Food  Insecurity: No Food Insecurity (09/17/2022)   Hunger Vital Sign    Worried About Running Out of Food in the Last Year: Never true    Ran Out of Food in the Last Year: Never true  Transportation Needs: No Transportation Needs (09/17/2022)   PRAPARE - Administrator, Civil Service (Medical): No    Lack of Transportation (Non-Medical): No  Physical Activity: Inactive (07/08/2022)   Exercise Vital Sign    Days of Exercise per Week: 0 days    Minutes of Exercise per Session: 0 min  Stress: No Stress Concern Present (07/08/2022)   [employer] of Occupational Health - Occupational Stress Questionnaire    Feeling of Stress : Not at all  Social Connections: Socially Isolated (07/08/2022)   Social Connection and Isolation Panel [NHANES]    Frequency of Communication with Friends and Family: Three times a week    Frequency of Social Gatherings with Friends and Family: Never    Attends Religious Services: Never    Database administrator or Organizations: No    Attends Banker Meetings: Never    Marital Status: Divorced  Catering manager Violence: Not At Risk (09/17/2022)   Humiliation, Afraid, Rape, and Kick questionnaire    Fear of Current or Ex-Partner: No    Emotionally Abused: No    Physically Abused: No    Sexually Abused: No    FAMILY HISTORY: Family History  Problem Relation Age of Onset   Skin cancer Mother    Lung cancer Father    Uterine cancer Sister    Heart disease Brother    Bipolar disorder Sister    Throat cancer Paternal Grandmother     ALLERGIES:  is allergic to acthar hp [corticotropin], codeine, and prednisone.  MEDICATIONS:  Current Outpatient Medications  Medication Sig Dispense Refill   albuterol (VENTOLIN HFA) 108 (90 Base) MCG/ACT inhaler INHALE 1-2 PUFFS INTO THE LUNGS EVERY 6 (SIX) HOURS AS NEEDED FOR WHEEZING OR SHORTNESS OF BREATH. USE SPARINGLY! 18 each 0   amiodarone (PACERONE) 200 MG tablet Take 0.5 tablets (100 mg total) by  mouth daily. 180 tablet 3   aspirin EC 81 MG tablet Take 1 tablet (81 mg total) by mouth daily. Swallow whole. 30 tablet 0   atorvastatin (LIPITOR) 20 MG tablet TAKE 1 TABLET BY MOUTH EVERYDAY AT BEDTIME 90 tablet 1   dapagliflozin propanediol (FARXIGA) 10 MG TABS tablet Take 1 tablet (10 mg total)  by mouth daily before breakfast. 30 tablet 11   DULoxetine (CYMBALTA) 20 MG capsule TAKE 1 CAPSULE (20 MG TOTAL) BY MOUTH DAILY. FOR ANXIETY AND PAIN. 90 capsule 2   feeding supplement, ENSURE ENLIVE, (ENSURE ENLIVE) LIQD Take 237 mLs by mouth 2 (two) times daily between meals.     ferrous sulfate 325 (65 FE) MG EC tablet Take 1 tablet (325 mg total) by mouth daily. 60 tablet 3   fluticasone-salmeterol (ADVAIR DISKUS) 250-50 MCG/ACT AEPB INHALE 1 PUFF INTO THE LUNGS TWICE A DAY 60 each 5   metoprolol succinate (TOPROL-XL) 25 MG 24 hr tablet Take 1 tablet (25 mg total) by mouth daily. 90 tablet 1   omeprazole (PRILOSEC) 40 MG capsule Take 1 capsule (40 mg total) by mouth daily. For heartburn. 90 capsule 1   Pediatric Multiple Vitamins (FLINTSTONES MULTIVITAMIN) CHEW Chew by mouth.     potassium chloride SA (KLOR-CON M20) 20 MEQ tablet Take 2 tablets (40 mEq total) by mouth 2 (two) times daily. 120 tablet 3   Probiotic Product (PROBIOTIC ADVANCED) CAPS Take 1 capsule by mouth daily.     spironolactone (ALDACTONE) 25 MG tablet Take 1 tablet (25 mg total) by mouth 2 (two) times daily. 180 tablet 3   torsemide (DEMADEX) 20 MG tablet Take 60mg  every morning and 40 mg every evening 120 tablet 3   trolamine salicylate (ASPERCREME) 10 % cream Apply 1 application  topically daily as needed for muscle pain (back or joint pain).     vitamin B-12 (CYANOCOBALAMIN) 500 MCG tablet Take 500 mcg by mouth 4 (four) times a week. 4 times weekly no more than 2000 MCG per week     nitroGLYCERIN (NITROSTAT) 0.4 MG SL tablet Place 1 tablet (0.4 mg total) under the tongue every 5 (five) minutes as needed for chest pain. (Patient  not taking: Reported on 04/20/2023) 20 tablet 5   Current Facility-Administered Medications  Medication Dose Route Frequency Provider Last Rate Last Admin   albumin human 25 % solution 25 g  25 g Intravenous Once Wyline Mood, MD         PHYSICAL EXAMINATION: ECOG PERFORMANCE STATUS: 2 - Symptomatic, <50% confined to bed Vitals:   04/20/23 1409  BP: 120/61  Pulse: 68  Resp: 18  Temp: (!) 97.5 F (36.4 C)   Filed Weights   04/20/23 1409  Weight: 132 lb 4.8 oz (60 kg)    Physical Exam Constitutional:      General: She is not in acute distress.    Comments: Patient sits in the wheelchair  HENT:     Head: Normocephalic and atraumatic.  Eyes:     General: No scleral icterus. Cardiovascular:     Rate and Rhythm: Normal rate and regular rhythm.     Heart sounds: Murmur heard.  Pulmonary:     Effort: Pulmonary effort is normal. No respiratory distress.     Breath sounds: No wheezing.     Comments: Decreased breath sound bilaterally. Abdominal:     General: Bowel sounds are normal. There is distension.     Palpations: Abdomen is soft.  Musculoskeletal:        General: No deformity. Normal range of motion.     Cervical back: Normal range of motion and neck supple.  Skin:    General: Skin is warm and dry.     Comments: Bilateral upper extremity ecchymosis.  Neurological:     Mental Status: She is alert and oriented to person, place, and time. Mental  status is at baseline.  Psychiatric:        Mood and Affect: Mood normal.      LABORATORY DATA:  I have reviewed the data as listed    Latest Ref Rng & Units 04/15/2023    2:24 PM 12/10/2022    2:49 PM 09/29/2022   11:45 AM  CBC  WBC 4.0 - 10.5 K/uL 7.0  7.3  7.2   Hemoglobin 12.0 - 15.0 g/dL 9.7  9.1  40.9   Hematocrit 36.0 - 46.0 % 32.9  30.0  37.5   Platelets 150 - 400 K/uL 201  265  173       Latest Ref Rng & Units 04/17/2023   11:12 AM 03/31/2023    1:43 PM 02/12/2023   11:44 AM  CMP  Glucose 70 - 99 mg/dL 81  87   91   BUN 6 - 20 mg/dL 32  35  30   Creatinine 0.44 - 1.00 mg/dL 8.11  9.14  7.82   Sodium 135 - 145 mmol/L 135  133  137   Potassium 3.5 - 5.1 mmol/L 4.9  4.3  3.8   Chloride 98 - 111 mmol/L 109  105  106   CO2 22 - 32 mmol/L 20  20  22    Calcium 8.9 - 10.3 mg/dL 8.5  8.6  8.5   Total Protein 6.5 - 8.1 g/dL 7.3     Total Bilirubin 0.3 - 1.2 mg/dL 0.7     Alkaline Phos 38 - 126 U/L 100     AST 15 - 41 U/L 23     ALT 0 - 44 U/L 17        Iron/TIBC/Ferritin/ %Sat    Component Value Date/Time   IRON 33 04/15/2023 1424   IRON 39 12/25/2022 1416   TIBC 351 04/15/2023 1424   TIBC 288 12/25/2022 1416   FERRITIN 54 04/15/2023 1424   FERRITIN 152 (H) 12/25/2022 1416   IRONPCTSAT 9 (L) 04/15/2023 1424   IRONPCTSAT 14 (L) 12/25/2022 1416

## 2023-04-20 NOTE — Assessment & Plan Note (Signed)
Encourage oral hydration and avoid nephrotoxins.   

## 2023-04-21 ENCOUNTER — Ambulatory Visit
Admission: RE | Admit: 2023-04-21 | Discharge: 2023-04-21 | Disposition: A | Discharge status: Home / Self Care | Payer: Medicare Other | Source: Ambulatory Visit | Attending: Cardiology | Admitting: Cardiology

## 2023-04-21 DIAGNOSIS — R188 Other ascites: Secondary | ICD-10-CM | POA: Diagnosis not present

## 2023-04-21 DIAGNOSIS — I509 Heart failure, unspecified: Secondary | ICD-10-CM | POA: Diagnosis not present

## 2023-04-21 NOTE — Procedures (Signed)
Ultrasound-guided therapeutic paracentesis performed yielding 6.2 liters of straw colored fluid.No immediate complications. EBL is none.

## 2023-04-30 ENCOUNTER — Telehealth: Payer: Self-pay

## 2023-04-30 NOTE — Telephone Encounter (Signed)
1430- Palliative Care Note  RN attempted to contact pt for palliative care check in. No answer. LVM with contact info and request to return call.  This is attempt #3.  Barbette Merino, RN

## 2023-05-08 ENCOUNTER — Ambulatory Visit (HOSPITAL_BASED_OUTPATIENT_CLINIC_OR_DEPARTMENT_OTHER): Payer: Medicare Other | Admitting: Cardiology

## 2023-05-08 ENCOUNTER — Other Ambulatory Visit
Admission: RE | Admit: 2023-05-08 | Discharge: 2023-05-08 | Disposition: A | Discharge status: Home / Self Care | Payer: Medicare Other | Source: Ambulatory Visit | Attending: Cardiology | Admitting: Cardiology

## 2023-05-08 ENCOUNTER — Inpatient Hospital Stay: Payer: Medicare Other

## 2023-05-08 VITALS — BP 111/53 | HR 58 | Temp 98.3°F | Resp 19

## 2023-05-08 DIAGNOSIS — E538 Deficiency of other specified B group vitamins: Secondary | ICD-10-CM | POA: Diagnosis not present

## 2023-05-08 DIAGNOSIS — D631 Anemia in chronic kidney disease: Secondary | ICD-10-CM | POA: Diagnosis not present

## 2023-05-08 DIAGNOSIS — I255 Ischemic cardiomyopathy: Secondary | ICD-10-CM | POA: Diagnosis not present

## 2023-05-08 DIAGNOSIS — I5042 Chronic combined systolic (congestive) and diastolic (congestive) heart failure: Secondary | ICD-10-CM

## 2023-05-08 DIAGNOSIS — I252 Old myocardial infarction: Secondary | ICD-10-CM | POA: Diagnosis not present

## 2023-05-08 DIAGNOSIS — K7460 Unspecified cirrhosis of liver: Secondary | ICD-10-CM | POA: Diagnosis not present

## 2023-05-08 DIAGNOSIS — N1831 Chronic kidney disease, stage 3a: Secondary | ICD-10-CM | POA: Diagnosis not present

## 2023-05-08 DIAGNOSIS — R188 Other ascites: Secondary | ICD-10-CM | POA: Diagnosis not present

## 2023-05-08 DIAGNOSIS — D509 Iron deficiency anemia, unspecified: Secondary | ICD-10-CM

## 2023-05-08 LAB — BASIC METABOLIC PANEL
Anion gap: 7 (ref 5–15)
BUN: 45 mg/dL — ABNORMAL HIGH (ref 6–20)
CO2: 23 mmol/L (ref 22–32)
Calcium: 8.8 mg/dL — ABNORMAL LOW (ref 8.9–10.3)
Chloride: 105 mmol/L (ref 98–111)
Creatinine, Ser: 1.88 mg/dL — ABNORMAL HIGH (ref 0.44–1.00)
GFR, Estimated: 30 mL/min — ABNORMAL LOW (ref >60)
Glucose, Bld: 102 mg/dL — ABNORMAL HIGH (ref 70–99)
Potassium: 4.5 mmol/L (ref 3.5–5.1)
Sodium: 135 mmol/L (ref 135–145)

## 2023-05-08 LAB — TSH: TSH: 11.961 u[IU]/mL — ABNORMAL HIGH (ref 0.350–4.500)

## 2023-05-08 LAB — T4, FREE: Free T4: 0.59 ng/dL — ABNORMAL LOW (ref 0.61–1.12)

## 2023-05-08 MED ORDER — SODIUM CHLORIDE 0.9 % IV SOLN
200.0000 mg | Freq: Once | INTRAVENOUS | Status: AC
  Administered 2023-05-08: 200 mg via INTRAVENOUS
  Filled 2023-05-08: qty 200

## 2023-05-08 MED ORDER — TORSEMIDE 20 MG PO TABS
60.0000 mg | ORAL_TABLET | Freq: Two times a day (BID) | ORAL | 3 refills | Status: DC
Start: 2023-05-08 — End: 2023-08-20

## 2023-05-08 MED ORDER — SODIUM CHLORIDE 0.9 % IV SOLN
Freq: Once | INTRAVENOUS | Status: AC
  Filled 2023-05-08: qty 250

## 2023-05-08 NOTE — Patient Instructions (Signed)

## 2023-05-08 NOTE — Patient Instructions (Signed)
Medication Changes:  Increase Torsemide to 60 mg (3 tabs) Twice daily   Lab Work:  Labs done today, your results will be available in MyChart, we will contact you for abnormal readings.  Your physician recommends that you return for lab work in: 1-2 weeks [facility] Entrance at [facility] 1st desk on the right to check in (REGISTRATION)  Lab hours: Monday- Friday (7:30 am- 5:30 pm)  Testing/Procedures:  none  Referrals:  none  Special Instructions // Education:  Do the following things EVERYDAY: Weigh yourself in the morning before breakfast. Write it down and keep it in a log. Take your medicines as prescribed Eat low salt foods--Limit salt (sodium) to 2000 mg per day.  Stay as active as you can everyday Limit all fluids for the day to less than 2 liters   Follow-Up in: 4-6 weeks    If you have any questions or concerns before your next appointment please send [place] a message through mychart or call our office at [phone] Monday-Friday 8 am-5 pm.   If you have an urgent need after hours on the weekend please call your Primary Cardiologist or the [facility] in [place] at [phone].

## 2023-05-09 LAB — T3, FREE: T3, Free: 2.4 pg/mL (ref 2.0–4.4)

## 2023-05-10 NOTE — Progress Notes (Signed)
PCP: Doreene Nest, NP HF Cardiology: Dr. Shirlee Latch  62 y.o. with history of multiple sclerosis, CAD, chronic systolic CHF, congestive hepatopathy and valvular heart disease was referred by Clarisa Kindred for CHF evaluation.  Last echo in 9/23 showed EF 30-35%, mild LVH, severe RV dysfunction with moderate RV enlargement, severe MR, severe TR.  She has been struggling with volume overload including cardiogenic ascites for months. She has multiple sclerosis, secondary progressive. She walks with a rolling walker and has significant left leg weakness.  She has slurred speech and some visual difficulty.  She was deemed not a surgical candidate for her valves, and she was thought to be too high risk for TEE.  Her last cath was in 4/21 and showed occluded RHC with collaterals.  There was moderate disease in the LAD and LCx not thought to be hemodynamically significant, medically managed. She has had several paracenteses over the last couple of months for ascites.  She has cirrhosis likely secondary to RV failure. Most recent admission was in 10/23 with SVT requiring adenosine and volume overload treated with Lasix gtt.   Patient had TEE in 4/24 showing EF 40%, moderate RV enlargement with moderate systolic dysfunction, severe functional MR with restricted posterior leaflet, peak RV-RA gradient 48 mmHg, severe TR.    Patient returns for followup of CHF.  She had paracenteses in 3/24, 4/24, and in 5/24, most recently with removal of 6.2 L.  Weight down 10 lbs.  She had 2 episodes of PND in the last week.  She sleeps on 1 pillow now.  Stable mild dyspnea walking around the house.  No syncope/falls.   Labs (1/24): K 4.2, creatinine 1.69, AST/ALT/bilirubin normal, LDL 56 Labs (4/24): K 4.3, creatinine 1.82 Labs (5/24): hgb 9.7, plts 201, K 4.9, creatinine 1.78, LFTs normal, albumin 3.4, TSH normal, BNP 2261  PMH: 1. CAD: LHC in 4/21 with 60% mLAD, 70% D1, 65% mLCx, occluded proximal RCA with collaterals.  Medically managed.  2. COPD 3. H/o SVT 4. Multiple sclerosis: Secondary progressive.  5. Anemia of CKD 6. CKD stage 3 7. B12 deficiency.  8. Chronic systolic CHF: Mixed ischemic and valvular cardiomyopathy.   - Echo (9/23): EF 30-35%, mild LVH, severe RV dysfunction with moderate RV enlargement, severe MR, severe TR.  - TEE (4/24): EF 40%, moderate RV enlargement with moderate systolic dysfunction, severe functional MR with restricted posterior leaflet, peak RV-RA gradient 48 mmHg, severe TR. 9. Valvular heart disease: Severe MR and severe TR on 9/23 echo. Not candidate for surgical valve replacement.  - TEE (4/24): EF 40%, moderate RV enlargement with moderate systolic dysfunction, severe functional MR with restricted posterior leaflet, peak RV-RA gradient 48 mmHg, severe TR. 10. Cirrhosis: Suspect due to RV failure/hepatic congestion.  - H/o paracenteses.   Social History   Socioeconomic History   Marital status: Divorced    Spouse name: Not on file   Number of children: 2   Years of education: Not on file   Highest education level: Not on file  Occupational History   Not on file  Tobacco Use   Smoking status: Former    Packs/day: 1    Types: Cigarettes   Smokeless tobacco: Never  Vaping Use   Vaping Use: Never used  Substance and Sexual Activity   Alcohol use: Not Currently    Comment: Occasional   Drug use: Never   Sexual activity: Not on file  Other Topics Concern   Not on file  Social History Narrative  Right handed    Lives in one story home   Drinks Caffeine - Sweet Tea    Social Determinants of Health   Financial Resource Strain: Low Risk  (07/08/2022)   Overall Financial Resource Strain (CARDIA)    Difficulty of Paying Living Expenses: Not hard at all  Food Insecurity: No Food Insecurity (09/17/2022)   Hunger Vital Sign    Worried About Running Out of Food in the Last Year: Never true    Ran Out of Food in the Last Year: Never true  Transportation Needs:  No Transportation Needs (09/17/2022)   PRAPARE - Administrator, Civil Service (Medical): No    Lack of Transportation (Non-Medical): No  Physical Activity: Inactive (07/08/2022)   Exercise Vital Sign    Days of Exercise per Week: 0 days    Minutes of Exercise per Session: 0 min  Stress: No Stress Concern Present (07/08/2022)   [employer] of Occupational Health - Occupational Stress Questionnaire    Feeling of Stress : Not at all  Social Connections: Socially Isolated (07/08/2022)   Social Connection and Isolation Panel [NHANES]    Frequency of Communication with Friends and Family: Three times a week    Frequency of Social Gatherings with Friends and Family: Never    Attends Religious Services: Never    Database administrator or Organizations: No    Attends Banker Meetings: Never    Marital Status: Divorced  Catering manager Violence: Not At Risk (09/17/2022)   Humiliation, Afraid, Rape, and Kick questionnaire    Fear of Current or Ex-Partner: No    Emotionally Abused: No    Physically Abused: No    Sexually Abused: No   Family History  Problem Relation Age of Onset   Skin cancer Mother    Lung cancer Father    Uterine cancer Sister    Heart disease Brother    Bipolar disorder Sister    Throat cancer Paternal Grandmother    ROS: All systems reviewed and negative except as per HPI.   Current Outpatient Medications  Medication Sig Dispense Refill   albuterol (VENTOLIN HFA) 108 (90 Base) MCG/ACT inhaler INHALE 1-2 PUFFS INTO THE LUNGS EVERY 6 (SIX) HOURS AS NEEDED FOR WHEEZING OR SHORTNESS OF BREATH. USE SPARINGLY! 18 each 0   amiodarone (PACERONE) 200 MG tablet Take 0.5 tablets (100 mg total) by mouth daily. 180 tablet 3   aspirin EC 81 MG tablet Take 1 tablet (81 mg total) by mouth daily. Swallow whole. 30 tablet 0   atorvastatin (LIPITOR) 20 MG tablet TAKE 1 TABLET BY MOUTH EVERYDAY AT BEDTIME 90 tablet 1   dapagliflozin propanediol (FARXIGA) 10  MG TABS tablet Take 1 tablet (10 mg total) by mouth daily before breakfast. 30 tablet 11   DULoxetine (CYMBALTA) 20 MG capsule TAKE 1 CAPSULE (20 MG TOTAL) BY MOUTH DAILY. FOR ANXIETY AND PAIN. 90 capsule 2   feeding supplement, ENSURE ENLIVE, (ENSURE ENLIVE) LIQD Take 237 mLs by mouth 2 (two) times daily between meals.     ferrous sulfate 325 (65 FE) MG EC tablet Take 1 tablet (325 mg total) by mouth daily. 60 tablet 3   fluticasone-salmeterol (ADVAIR DISKUS) 250-50 MCG/ACT AEPB INHALE 1 PUFF INTO THE LUNGS TWICE A DAY 60 each 5   metoprolol succinate (TOPROL-XL) 25 MG 24 hr tablet Take 1 tablet (25 mg total) by mouth daily. 90 tablet 1   nitroGLYCERIN (NITROSTAT) 0.4 MG SL tablet Place 1 tablet (0.4 mg  total) under the tongue every 5 (five) minutes as needed for chest pain. 20 tablet 5   omeprazole (PRILOSEC) 40 MG capsule Take 1 capsule (40 mg total) by mouth daily. For heartburn. 90 capsule 1   Pediatric Multiple Vitamins (FLINTSTONES MULTIVITAMIN) CHEW Chew by mouth.     potassium chloride SA (KLOR-CON M20) 20 MEQ tablet Take 2 tablets (40 mEq total) by mouth 2 (two) times daily. 120 tablet 3   Probiotic Product (PROBIOTIC ADVANCED) CAPS Take 1 capsule by mouth daily.     spironolactone (ALDACTONE) 25 MG tablet Take 1 tablet (25 mg total) by mouth 2 (two) times daily. 180 tablet 3   trolamine salicylate (ASPERCREME) 10 % cream Apply 1 application  topically daily as needed for muscle pain (back or joint pain).     vitamin B-12 (CYANOCOBALAMIN) 500 MCG tablet Take 500 mcg by mouth 4 (four) times a week. 4 times weekly no more than 2000 MCG per week     torsemide (DEMADEX) 20 MG tablet Take 3 tablets (60 mg total) by mouth 2 (two) times daily. 120 tablet 3   Current Facility-Administered Medications  Medication Dose Route Frequency Provider Last Rate Last Admin   albumin human 25 % solution 25 g  25 g Intravenous Once Wyline Mood, MD       BP (!) 105/58   Pulse (!) 59   Wt 125 lb 6 oz (56.9  kg)   SpO2 100%   BMI 22.21 kg/m  General: NAD Neck: JVP 9-10 cm with HJR, no thyromegaly or thyroid nodule.  Lungs: Clear to auscultation bilaterally with normal respiratory effort. CV: Nondisplaced PMI.  Heart regular S1/S2, no S3/S4, 2/6 HSM apex.  No peripheral edema.  No carotid bruit.  Normal pedal pulses.  Abdomen: Soft, nontender, no hepatosplenomegaly, mild distention.  Skin: Intact without lesions or rashes.  Neurologic: Alert and oriented x 3.  Psych: Normal affect. Extremities: No clubbing or cyanosis.  HEENT: Normal.   Assessment/Plan: 1. CAD: Cath in 4/21 with occluded RCA with collaterals and moderate LAD, LCx disease.  No chest pain.  - Continue atorvastatin, good lipids in 1/24.  - Continue ASA 81 daily.  2. SVT: She has been on amiodarone for this.  Required adenosine to terminate at 10/23 admission.   - Keep amiodarone minimal with cirrhosis, now on 100 mg daily. LFTs normal in 5/24.  TSH mildly elevated when last checked, check TSH/free T3/free T4.  She will need regular eye exam.  3. Chronic systolic CHF: Suspect mixed ischemic and valvular cardiomyopathy. Echo in 9/23 showed EF 30-35%, mild LVH, severe RV dysfunction with moderate RV enlargement, severe MR, severe TR. TEE in 4/24 with EF 40%, moderate RV enlargement with moderate systolic dysfunction, severe functional MR with restricted posterior leaflet, peak RV-RA gradient 48 mmHg, severe TR.  She remains volume overloaded with NYHA class III symptoms.   - Increase torsemide to 60 mg bid with BMET/BNP today and in 10 days.  - Continue spironolactone 25 mg bid with cardiomyopathy and ascites.  - Farxiga 10 mg daily.  - She became hypotensive on Entresto in the past.  - Continue Toprol XL 25 mg daily.  4. Cirrhosis with ascites: Suspect cardiogenic cirrhosis due to RV failure.  Exam not suggestive of severe ascites today. She had paracentesis a couple of weeks ago.   - Continue spironolactone as above.   - Follows  with GI.  5. Valvular heart disease: Severe MR and severe TR, functional based on TEE. She  is not a candidate for open heart surgery with cirrhosis, poor mobility (MS), and frailty.   I discussed her situation with structural heart colleagues.  She could potentially have mTEER but would be very high risk for anesthesia.  This would also not fix her TR which is probably as much or more of a problem for her. Therefore, we thought that the risk/benefit ratio does not favor mTEER. Unfortunately, I suspect her CHF will continue to progress.  We have discussed eventual hospice referral.  6. CKD stage 3: BMET today.   Followup in 4-5 wks.    Marca Ancona 05/10/2023

## 2023-05-11 ENCOUNTER — Telehealth: Payer: Self-pay

## 2023-05-11 NOTE — Telephone Encounter (Signed)
Patients daughter called this morning regarding voicemail that was left on Friday for results. Patients daughter was given instructions on starting patient on Levoxyl as well as letting daughter know patient needed a BMET re-draw this week. Daughter verbalized understanding and stated she would try to get patient to take Levoxyl but it may be difficult.

## 2023-05-11 NOTE — Telephone Encounter (Signed)
Entered in error

## 2023-05-15 ENCOUNTER — Inpatient Hospital Stay: Payer: Medicare Other | Attending: Oncology

## 2023-05-15 VITALS — BP 120/60 | HR 63 | Temp 97.1°F | Resp 18

## 2023-05-15 DIAGNOSIS — Z87891 Personal history of nicotine dependence: Secondary | ICD-10-CM | POA: Diagnosis not present

## 2023-05-15 DIAGNOSIS — D631 Anemia in chronic kidney disease: Secondary | ICD-10-CM | POA: Insufficient documentation

## 2023-05-15 DIAGNOSIS — Z808 Family history of malignant neoplasm of other organs or systems: Secondary | ICD-10-CM | POA: Diagnosis not present

## 2023-05-15 DIAGNOSIS — Z79899 Other long term (current) drug therapy: Secondary | ICD-10-CM | POA: Insufficient documentation

## 2023-05-15 DIAGNOSIS — R5383 Other fatigue: Secondary | ICD-10-CM | POA: Diagnosis not present

## 2023-05-15 DIAGNOSIS — N1831 Chronic kidney disease, stage 3a: Secondary | ICD-10-CM | POA: Insufficient documentation

## 2023-05-15 DIAGNOSIS — Z8049 Family history of malignant neoplasm of other genital organs: Secondary | ICD-10-CM | POA: Diagnosis not present

## 2023-05-15 DIAGNOSIS — Z8249 Family history of ischemic heart disease and other diseases of the circulatory system: Secondary | ICD-10-CM | POA: Diagnosis not present

## 2023-05-15 DIAGNOSIS — Z818 Family history of other mental and behavioral disorders: Secondary | ICD-10-CM | POA: Insufficient documentation

## 2023-05-15 DIAGNOSIS — E538 Deficiency of other specified B group vitamins: Secondary | ICD-10-CM | POA: Diagnosis not present

## 2023-05-15 DIAGNOSIS — D509 Iron deficiency anemia, unspecified: Secondary | ICD-10-CM

## 2023-05-15 DIAGNOSIS — Z801 Family history of malignant neoplasm of trachea, bronchus and lung: Secondary | ICD-10-CM | POA: Insufficient documentation

## 2023-05-15 MED ORDER — SODIUM CHLORIDE 0.9 % IV SOLN
200.0000 mg | Freq: Once | INTRAVENOUS | Status: AC
  Administered 2023-05-15: 200 mg via INTRAVENOUS
  Filled 2023-05-15: qty 200

## 2023-05-15 MED ORDER — EPOETIN ALFA-EPBX 20000 UNIT/ML IJ SOLN: 20000.0000 [IU] | Freq: Once | INTRAMUSCULAR | Status: DC

## 2023-05-15 MED ORDER — SODIUM CHLORIDE 0.9 % IV SOLN
Freq: Once | INTRAVENOUS | Status: AC
  Filled 2023-05-15: qty 250

## 2023-05-22 ENCOUNTER — Other Ambulatory Visit
Admission: RE | Admit: 2023-05-22 | Discharge: 2023-05-22 | Disposition: A | Discharge status: Home / Self Care | Payer: Medicare Other | Source: Ambulatory Visit | Attending: Cardiology | Admitting: Cardiology

## 2023-05-22 ENCOUNTER — Inpatient Hospital Stay: Payer: Medicare Other

## 2023-05-22 ENCOUNTER — Ambulatory Visit: Payer: Medicare Other

## 2023-05-22 VITALS — BP 112/74 | HR 60 | Temp 96.5°F | Resp 18

## 2023-05-22 DIAGNOSIS — R5383 Other fatigue: Secondary | ICD-10-CM | POA: Diagnosis not present

## 2023-05-22 DIAGNOSIS — Z79899 Other long term (current) drug therapy: Secondary | ICD-10-CM | POA: Diagnosis not present

## 2023-05-22 DIAGNOSIS — D631 Anemia in chronic kidney disease: Secondary | ICD-10-CM | POA: Insufficient documentation

## 2023-05-22 DIAGNOSIS — E538 Deficiency of other specified B group vitamins: Secondary | ICD-10-CM | POA: Diagnosis not present

## 2023-05-22 DIAGNOSIS — I5042 Chronic combined systolic (congestive) and diastolic (congestive) heart failure: Secondary | ICD-10-CM | POA: Diagnosis not present

## 2023-05-22 DIAGNOSIS — D509 Iron deficiency anemia, unspecified: Secondary | ICD-10-CM

## 2023-05-22 DIAGNOSIS — N1831 Chronic kidney disease, stage 3a: Secondary | ICD-10-CM | POA: Insufficient documentation

## 2023-05-22 DIAGNOSIS — Z87891 Personal history of nicotine dependence: Secondary | ICD-10-CM | POA: Diagnosis not present

## 2023-05-22 LAB — TSH: TSH: 14.093 u[IU]/mL — ABNORMAL HIGH (ref 0.350–4.500)

## 2023-05-22 LAB — CBC WITH DIFFERENTIAL (CANCER CENTER ONLY)
Abs Immature Granulocytes: 0.02 10*3/uL (ref 0.00–0.07)
Basophils Absolute: 0.1 10*3/uL (ref 0.0–0.1)
Basophils Relative: 1 %
Eosinophils Absolute: 0.2 10*3/uL (ref 0.0–0.5)
Eosinophils Relative: 2 %
HCT: 37.4 % (ref 36.0–46.0)
Hemoglobin: 10.9 g/dL — ABNORMAL LOW (ref 12.0–15.0)
Immature Granulocytes: 0 %
Lymphocytes Relative: 6 %
Lymphs Abs: 0.4 10*3/uL — ABNORMAL LOW (ref 0.7–4.0)
MCH: 28.2 pg (ref 26.0–34.0)
MCHC: 29.1 g/dL — ABNORMAL LOW (ref 30.0–36.0)
MCV: 96.9 fL (ref 80.0–100.0)
Monocytes Absolute: 0.8 10*3/uL (ref 0.1–1.0)
Monocytes Relative: 11 %
Neutro Abs: 5.6 10*3/uL (ref 1.7–7.7)
Neutrophils Relative %: 80 %
Platelet Count: 199 10*3/uL (ref 150–400)
RBC: 3.86 MIL/uL — ABNORMAL LOW (ref 3.87–5.11)
RDW: 20.6 % — ABNORMAL HIGH (ref 11.5–15.5)
WBC Count: 7.1 10*3/uL (ref 4.0–10.5)
nRBC: 0 % (ref 0.0–0.2)

## 2023-05-22 LAB — IRON AND TIBC
Iron: 49 ug/dL (ref 28–170)
Saturation Ratios: 14 % (ref 10.4–31.8)
TIBC: 364 ug/dL (ref 250–450)
UIBC: 315 ug/dL

## 2023-05-22 LAB — BASIC METABOLIC PANEL
Anion gap: 9 (ref 5–15)
BUN: 40 mg/dL — ABNORMAL HIGH (ref 6–20)
CO2: 16 mmol/L — ABNORMAL LOW (ref 22–32)
Calcium: 8.8 mg/dL — ABNORMAL LOW (ref 8.9–10.3)
Chloride: 112 mmol/L — ABNORMAL HIGH (ref 98–111)
Creatinine, Ser: 1.62 mg/dL — ABNORMAL HIGH (ref 0.44–1.00)
GFR, Estimated: 36 mL/min — ABNORMAL LOW (ref >60)
Glucose, Bld: 88 mg/dL (ref 70–99)
Potassium: 4.2 mmol/L (ref 3.5–5.1)
Sodium: 137 mmol/L (ref 135–145)

## 2023-05-22 LAB — T4, FREE: Free T4: 0.59 ng/dL — ABNORMAL LOW (ref 0.61–1.12)

## 2023-05-22 LAB — FERRITIN: Ferritin: 220 ng/mL (ref 11–307)

## 2023-05-22 MED ORDER — SODIUM CHLORIDE 0.9 % IV SOLN
200.0000 mg | Freq: Once | INTRAVENOUS | Status: AC
  Administered 2023-05-22: 200 mg via INTRAVENOUS
  Filled 2023-05-22: qty 200

## 2023-05-22 MED ORDER — SODIUM CHLORIDE 0.9 % IV SOLN
Freq: Once | INTRAVENOUS | Status: AC
  Filled 2023-05-22: qty 250

## 2023-05-22 NOTE — Addendum Note (Signed)
Addended by: Shanekia Latella on: 05/22/2023 02:45 PM   Modules accepted: Orders  

## 2023-05-22 NOTE — Addendum Note (Signed)
Addended by: Esperanza Sheets on: 05/22/2023 02:46 PM   Modules accepted: Orders

## 2023-05-22 NOTE — Addendum Note (Signed)
Addended by: Esperanza Sheets on: 05/22/2023 02:45 PM   Modules accepted: Orders

## 2023-05-23 LAB — T3, FREE: T3, Free: 2.1 pg/mL (ref 2.0–4.4)

## 2023-05-25 ENCOUNTER — Telehealth (HOSPITAL_COMMUNITY): Payer: Self-pay | Admitting: Cardiology

## 2023-05-25 MED ORDER — LEVOTHYROXINE SODIUM 25 MCG PO TABS: 25.0000 ug | ORAL_TABLET | Freq: Every day | ORAL | 3 refills | Status: DC

## 2023-05-25 NOTE — Telephone Encounter (Signed)
Pt aware of results via daughter

## 2023-06-03 NOTE — Progress Notes (Deleted)
NEUROLOGY FOLLOW UP OFFICE NOTE  Anita [name] [id number]  Assessment/Plan:   1  Multiple sclerosis - inactive secondary progressive 2  Migraine without aura, without status migrainosus, not intractable 3  Insomnia   *** Follow up in 12 months.     Subjective:  Anita [name] is a 62 year old right-handed female with CAD, prolonged QT, severe mitral regurg, CHF and arthritis who follows up for multiple sclerosis.  She is accompanied by her daughter who provides further history.  UPDATE: Unable to get MRI because she had trouble breathing when flat on the table.    Vision:  Vision is blurred.  has not had an eye exam since last visit.   Motor:  generalized weakness Sensory:  numbness and tingling in the right hand.  However may feel tingling and numbness in various extremities Pain:  Left lateral burning thigh pain Gait:  Requires walker.  Outside home, she uses wheelchair Bowel/Bladder:  Partial bowel and bladder incontinence Fatigue:  yes Cognition:  Mild cognitive impairment Mood:  okay Insomnia.  Naps off and on throughout the day.  Tried Benadryl which doesn't help.  Melatonin was ineffective.  Cannot take much due to cardiac comorbidities. Appetite is improved.  5 small meals a day.   Migraines:  Infrequent. Stopped sumatriptan due to CAD.  HISTORY:  Diagnosed with multiple sclerosis in May 2000 presenting as optic neuritis (right and then left).  For several years before then, she noted occasional paresthesias.  For several years (early 2001 to 2010) she declined being on DMT.  During that period, she clinically did well.  Occasionally she had balance problems and may have had another episode of optic neuritis.  She was on Tysabri in 2010 until about 2012.  Subsequently stopped due to positive JC Virus positive.  She reportedly had significant decline in 2014 with worsening gait in which she resorted to using a walker, mild cognitive impairment and  occasional fecal incontinence.  She thought it was just another flare up but symptoms never resolved.     She has had a decline since 2022.  She has multiple co-morbidities such as CHF and CAD.  She was in the hospital on at least 2 occasions over the past year with prolong rehab in which she was pretty much sedentary.       She has significant medical co-morbidities including COPD, rheumatic mitral regurgitation, CAD, orthostatic hypotension, combined systolic and diastolic heart failure, and iron-deficiency anemia   Current DMT:  None Past DMT:  Avonex (side effects), Copaxone, Tysabri (JC antibody positive)   Current medications:  midodrine, torsemide, sumatriptan 25mg , B12, ferrous sulfate, MVI Past medications:  H.P. Acthar Gel (throat swelling, hemoptysis), Solu-Medrol, mirtazapine    Imaging: 10/27/2003 MRI BRAIN W WO (personally reviewed):  increasing size and number of multiple intracranial lesions involving the periventricular subcortical white matter, consistent with progression of MS since previous study of April, 2002. 03/26/2001 MRI BRAIN W WO:  Small white matter lesions on the right are unchanged (compared to MRI from 04/05/99).  These lesions are suggestive but not diagnostic of multiple sclerosis.  Other causes of small vessel ischemic disease could also give this appearance, including diabetes, hypertension, vasculitis, or migraine headaches. 04/05/1999 MRI BRAIN W WO:  There are multiple small nonspecific foci of increased T2 prolongation involving the cerebral white matter.  In this young patient with reported clinically suspected optic neuritis multiple sclerosis is a consideration.  Further considerations include causes for small vessel disease including  diabetes, hypertension, vasculitis, collagen vascular disease, and migraines.   No know family history of MS.    PAST MEDICAL HISTORY: Past Medical History:  Diagnosis Date   Abnormality of gait 07/26/2013   Acute  respiratory failure with hypoxia (HCC) 03/20/2020   Allergy    Arthritis    Atypical pneumonia 03/22/2020   CAD (coronary artery disease)    a. 03/2020 Cath: LM nl, LAD 60/3m, 55d, D1 70, D2 70, LCX 45ost/p, 65p, RCA 100ost CTO. RPDA fills via collats from dLAD. Inf septal fills via collats from 1st septal, RPAV fills via collats from LPAV, RPL1/2 sev dzs-->med rx.   Chickenpox    Chronic combined systolic (congestive) and diastolic (congestive) heart failure (HCC)    a. 02/2021 Echo: EF 40-45%, glob HK, gr2 DD. Sev red RV fxn, RVSP 69.75mmHg. Mod dil LA, sev dil RA. Sev MR. Mild to mod MS. Mean MV grad 6.65mmHg. Mod-Sev TR. Mild AI. Mild Ao sclerosis w/o stenosis.   COPD (chronic obstructive pulmonary disease) (HCC)    Elevated troponin 03/22/2020   GERD (gastroesophageal reflux disease)    Headache(784.0)    Migraine   Hearing loss    Right ear secondary to infection   History of shingles    Ischemic cardiomyopathy    a. 02/2021 Echo: EF 40-45%, glob HK.   Migraines    Multiple sclerosis (HCC)    Obese    Optic neuritis    PAH (pulmonary artery hypertension) (HCC)    a. 02/2021 Echo: RVSP 69.67mmHg.   Prolonged QT interval 03/20/2020   Severe mitral regurgitation    a. 02/2021 Echo: Severe MR w/ mild to mod MS.  Mean MV grad 6.76mmHg.    MEDICATIONS: Current Outpatient Medications on File Prior to Visit  Medication Sig Dispense Refill   albuterol (VENTOLIN HFA) 108 (90 Base) MCG/ACT inhaler INHALE 1-2 PUFFS INTO THE LUNGS EVERY 6 (SIX) HOURS AS NEEDED FOR WHEEZING OR SHORTNESS OF BREATH. USE SPARINGLY! 18 each 0   calcium carbonate (TUMS EX) 750 MG chewable tablet Chew 1 tablet by mouth daily as needed for heartburn.     cetirizine (ZYRTEC) 10 MG tablet Take 10 mg by mouth daily.     Doxylamine Succinate, Sleep, (SLEEP AID PO) Take 1 tablet by mouth at bedtime as needed (sleep).     feeding supplement, ENSURE ENLIVE, (ENSURE ENLIVE) LIQD Take 237 mLs by mouth 2 (two) times daily  between meals.     ferrous sulfate 325 (65 FE) MG EC tablet Take 1 tablet (325 mg total) by mouth 2 (two) times daily with a meal. 60 tablet 3   fluticasone-salmeterol (ADVAIR DISKUS) 250-50 MCG/ACT AEPB INHALE 1 PUFF INTO THE LUNGS TWICE A DAY 60 each 5   guaiFENesin (MUCINEX) 600 MG 12 hr tablet Take 600 mg by mouth 2 (two) times daily as needed for cough.     metoprolol succinate (TOPROL-XL) 25 MG 24 hr tablet Take 1 tablet (25 mg total) by mouth daily. Take with or immediately following a meal. 90 tablet 3   Multiple Vitamin (MULTIVITAMIN WITH MINERALS) TABS tablet Take 1 tablet by mouth daily.     nitroGLYCERIN (NITROSTAT) 0.4 MG SL tablet Place 1 tablet (0.4 mg total) under the tongue every 5 (five) minutes as needed for chest pain. (Patient not taking: Reported on 04/09/2022) 20 tablet 5   omeprazole (PRILOSEC) 40 MG capsule Take 1 capsule (40 mg total) by mouth daily. 90 capsule 3   Probiotic Product (PROBIOTIC ADVANCED) CAPS Take  1 capsule by mouth daily.     torsemide (DEMADEX) 20 MG tablet Take for weight gain of 2 lbs in 1 day or 5 lbs in 1 week or obvious swelling. 30 tablet 11   trolamine salicylate (ASPERCREME) 10 % cream Apply 1 application. topically daily as needed for muscle pain (back or joint pain).     vitamin B-12 (CYANOCOBALAMIN) 1000 MCG tablet Take 1,000 mcg by mouth daily.     No current facility-administered medications on file prior to visit.    ALLERGIES: Allergies  Allergen Reactions   Acthar Hp [Corticotropin] Other (See Comments)    Throat swelling and coughing up blood   Codeine    Prednisone     FAMILY HISTORY: Family History  Problem Relation Age of Onset   Skin cancer Mother    Lung cancer Father    Uterine cancer Sister    Heart disease Brother    Bipolar disorder Sister    Throat cancer Paternal Grandmother       Objective:  *** General: No acute distress.  Patient appears well-groomed.   Head:  Normocephalic/atraumatic Eyes:  Fundi  examined but not visualized Neck: supple, no paraspinal tenderness, full range of motion Heart:  Regular rate and rhythm Neurological Exam: alert and oriented.  Speech fluent and not dysarthric, language intact.  CN II-XII intact. Decreased bulk.  Increased tone.  Muscle strength 5-/5 throughout.  Sensation to light touch intact.  Deep tendon reflexes 2+ throughout.  Finger to nose testing with bilateral dysmetria.  ***   Shon Millet, DO  CC: Vernona Rieger, NP

## 2023-06-05 ENCOUNTER — Ambulatory Visit: Payer: Medicare Other | Admitting: Neurology

## 2023-06-05 ENCOUNTER — Encounter: Payer: Medicare Other | Admitting: Family

## 2023-06-12 ENCOUNTER — Other Ambulatory Visit: Payer: Self-pay | Admitting: Oncology

## 2023-06-12 ENCOUNTER — Ambulatory Visit: Payer: Medicare Other | Attending: Family | Admitting: Family

## 2023-06-12 ENCOUNTER — Ambulatory Visit: Payer: Medicare Other | Admitting: Neurology

## 2023-06-12 ENCOUNTER — Encounter: Payer: Self-pay | Admitting: Family

## 2023-06-12 VITALS — BP 109/61 | HR 57 | Wt 128.8 lb

## 2023-06-12 DIAGNOSIS — Z79899 Other long term (current) drug therapy: Secondary | ICD-10-CM | POA: Insufficient documentation

## 2023-06-12 DIAGNOSIS — K7460 Unspecified cirrhosis of liver: Secondary | ICD-10-CM | POA: Insufficient documentation

## 2023-06-12 DIAGNOSIS — G35 Multiple sclerosis: Secondary | ICD-10-CM | POA: Insufficient documentation

## 2023-06-12 DIAGNOSIS — Z7982 Long term (current) use of aspirin: Secondary | ICD-10-CM | POA: Insufficient documentation

## 2023-06-12 DIAGNOSIS — I38 Endocarditis, valve unspecified: Secondary | ICD-10-CM | POA: Insufficient documentation

## 2023-06-12 DIAGNOSIS — I340 Nonrheumatic mitral (valve) insufficiency: Secondary | ICD-10-CM | POA: Diagnosis not present

## 2023-06-12 DIAGNOSIS — R188 Other ascites: Secondary | ICD-10-CM

## 2023-06-12 DIAGNOSIS — I5042 Chronic combined systolic (congestive) and diastolic (congestive) heart failure: Secondary | ICD-10-CM | POA: Diagnosis not present

## 2023-06-12 DIAGNOSIS — Z7984 Long term (current) use of oral hypoglycemic drugs: Secondary | ICD-10-CM | POA: Insufficient documentation

## 2023-06-12 DIAGNOSIS — I110 Hypertensive heart disease with heart failure: Secondary | ICD-10-CM | POA: Diagnosis not present

## 2023-06-12 DIAGNOSIS — Z87891 Personal history of nicotine dependence: Secondary | ICD-10-CM | POA: Insufficient documentation

## 2023-06-12 DIAGNOSIS — I10 Essential (primary) hypertension: Secondary | ICD-10-CM | POA: Diagnosis not present

## 2023-06-12 DIAGNOSIS — Z79890 Hormone replacement therapy (postmenopausal): Secondary | ICD-10-CM | POA: Insufficient documentation

## 2023-06-12 DIAGNOSIS — I2721 Secondary pulmonary arterial hypertension: Secondary | ICD-10-CM | POA: Diagnosis not present

## 2023-06-12 DIAGNOSIS — I4729 Other ventricular tachycardia: Secondary | ICD-10-CM | POA: Diagnosis not present

## 2023-06-12 DIAGNOSIS — I472 Ventricular tachycardia, unspecified: Secondary | ICD-10-CM | POA: Insufficient documentation

## 2023-06-12 DIAGNOSIS — Z7951 Long term (current) use of inhaled steroids: Secondary | ICD-10-CM | POA: Insufficient documentation

## 2023-06-12 DIAGNOSIS — I2510 Atherosclerotic heart disease of native coronary artery without angina pectoris: Secondary | ICD-10-CM

## 2023-06-12 DIAGNOSIS — J449 Chronic obstructive pulmonary disease, unspecified: Secondary | ICD-10-CM | POA: Diagnosis not present

## 2023-06-12 DIAGNOSIS — K219 Gastro-esophageal reflux disease without esophagitis: Secondary | ICD-10-CM | POA: Diagnosis not present

## 2023-06-12 DIAGNOSIS — I255 Ischemic cardiomyopathy: Secondary | ICD-10-CM | POA: Diagnosis not present

## 2023-06-12 NOTE — Progress Notes (Signed)
PCP: Doreene Nest, NP (last seen 10/23) Primary Cardiologist: Jodelle Red, MD (last seen 10/23) HF cardiologist: Marca Ancona, MD (last seen 05/24)  HPI:  Anita [name] is a 62 y/o female with a history of CAD (occluded RCA with collaterals), SVT, COPD, GERD, multiple sclerosis, pulmonary HTN, cirrhosis likely secondary to RV failure, severe valvular disease, previous tobacco use, congestive hepatopathy and chronic heart failure.   TEE 03/18/23: EF 40% with moderate LAE/ severe RAE and severe MR with restricted posterior leaflet, peak RV-RA gradient 48 mmHg, severe TR. No PFO or ASD. Echo 08/22/22: EF of 30-35% along with mild LVH and severe MR/TR. Echo 02/07/21: EF of 40-45% along with severely elevated PA pressure of 69.2 mmHg, moderate LAE, severe MR, mild/ moderate Anita and moderate/severe TR.   LHC done 03/23/20 showed: Hemodynamics: LV end diastolic pressure is moderately elevated. ----Coronary Angiography----- Ost RCA to Dist RCA lesion is 100% stenosed.-The PDA and PL system fills via faint collaterals from the AV groove LCx and LAD septals. Mid LAD-1 lesion is 60% stenosed. Mid LAD-2 lesion is 50% stenosed. Dist LAD lesion is 55% stenosed with 70% stenosed side branch in 2nd Diag. 1st Diag lesion is 70% stenosed. Ost Cx to Prox Cx lesion is 45% stenosed. Prox-MID Cx lesion is 65% stenosed.  MODERATE-SEVERE THREE-VESSEL CAD: 100% proximal RCA occlusion with left-to-right collaterals faintly filling PDA and PL system (AV groove LCx-PL and LAD septal-PDA) Diffuse moderate mid LAD disease with 60% to 50% stenosis. Tandem 50% and 65% proximal and mid LCx with the 65% lesion being the most significant lesion. Moderately elevated LVEDP of 18-20 mmHg  Admitted 09/16/22 due to SVT. Cardiology consult obtained. Given IV adenosine and then amiodarone drip. Lasix gtt. Plan for EP consult. Discharged after 3 days.      She presents today for a HF follow-up visit with a chief complaint  of moderate SOB with minimal exertion. Chronic in nature although feels like this may be a little bit better. Has associated fatigue, chronic dizziness, minimal abdominal distention along with this. Sleeping better on 1-2 pillows. Denies chest pain, palpitations, cough, pedal edema or difficulty sleeping. Reports eating well.    She has cirrhosis likely secondary to RV failure. She had paracenteses in 3/24, 4/24, and in 5/24, most recently with removal of 6.2 L.  ROS: All systems negative except as listed in HPI, PMH and Problem List.  SH:  Social History   Socioeconomic History   Marital status: Divorced    Spouse name: Not on file   Number of children: 2   Years of education: Not on file   Highest education level: Not on file  Occupational History   Not on file  Tobacco Use   Smoking status: Former    Packs/day: 1    Types: Cigarettes   Smokeless tobacco: Never  Vaping Use   Vaping Use: Never used  Substance and Sexual Activity   Alcohol use: Not Currently    Comment: Occasional   Drug use: Never   Sexual activity: Not on file  Other Topics Concern   Not on file  Social History Narrative   Right handed    Lives in one story home   Drinks Caffeine - Sweet Tea    Social Determinants of Health   Financial Resource Strain: Low Risk  (07/08/2022)   Overall Financial Resource Strain (CARDIA)    Difficulty of Paying Living Expenses: Not hard at all  Food Insecurity: No Food Insecurity (09/17/2022)   Hunger Vital  Sign    Worried About Programme researcher, broadcasting/film/video in the Last Year: Never true    Ran Out of Food in the Last Year: Never true  Transportation Needs: No Transportation Needs (09/17/2022)   PRAPARE - Administrator, Civil Service (Medical): No    Lack of Transportation (Non-Medical): No  Physical Activity: Inactive (07/08/2022)   Exercise Vital Sign    Days of Exercise per Week: 0 days    Minutes of Exercise per Session: 0 min  Stress: No Stress Concern Present  (07/08/2022)   [employer] of Occupational Health - Occupational Stress Questionnaire    Feeling of Stress : Not at all  Social Connections: Socially Isolated (07/08/2022)   Social Connection and Isolation Panel [NHANES]    Frequency of Communication with Friends and Family: Three times a week    Frequency of Social Gatherings with Friends and Family: Never    Attends Religious Services: Never    Database administrator or Organizations: No    Attends Banker Meetings: Never    Marital Status: Divorced  Catering manager Violence: Not At Risk (09/17/2022)   Humiliation, Afraid, Rape, and Kick questionnaire    Fear of Current or Ex-Partner: No    Emotionally Abused: No    Physically Abused: No    Sexually Abused: No    FH:  Family History  Problem Relation Age of Onset   Skin cancer Mother    Lung cancer Father    Uterine cancer Sister    Heart disease Brother    Bipolar disorder Sister    Throat cancer Paternal Grandmother     Past Medical History:  Diagnosis Date   Abnormality of gait 07/26/2013   Acute respiratory failure with hypoxia (HCC) 03/20/2020   Allergy    Arthritis    Atypical pneumonia 03/22/2020   CAD (coronary artery disease)    a. 03/2020 Cath: LM nl, LAD 60/61m, 55d, D1 70, D2 70, LCX 45ost/p, 65p, RCA 100ost CTO. RPDA fills via collats from dLAD. Inf septal fills via collats from 1st septal, RPAV fills via collats from LPAV, RPL1/2 sev dzs-->med rx.   Chickenpox    Chronic combined systolic (congestive) and diastolic (congestive) heart failure (HCC)    a. 02/2021 Echo: EF 40-45%, glob HK, gr2 DD. Sev red RV fxn, RVSP 69.53mmHg. Mod dil LA, sev dil RA. Sev MR. Mild to mod Anita. Mean MV grad 6.11mmHg. Mod-Sev TR. Mild AI. Mild Ao sclerosis w/o stenosis.   COPD (chronic obstructive pulmonary disease) (HCC)    Elevated troponin 03/22/2020   GERD (gastroesophageal reflux disease)    Headache(784.0)    Migraine   Hearing loss    Right ear secondary to  infection   History of shingles    Ischemic cardiomyopathy    a. 02/2021 Echo: EF 40-45%, glob HK.   Migraines    Multiple sclerosis (HCC)    Obese    Optic neuritis    PAH (pulmonary artery hypertension) (HCC)    a. 02/2021 Echo: RVSP 69.65mmHg.   Prolonged QT interval 03/20/2020   Severe mitral regurgitation    a. 02/2021 Echo: Severe MR w/ mild to mod Anita.  Mean MV grad 6.57mmHg.    Current Outpatient Medications  Medication Sig Dispense Refill   albuterol (VENTOLIN HFA) 108 (90 Base) MCG/ACT inhaler INHALE 1-2 PUFFS INTO THE LUNGS EVERY 6 (SIX) HOURS AS NEEDED FOR WHEEZING OR SHORTNESS OF BREATH. USE SPARINGLY! 18 each 0   amiodarone (PACERONE)  200 MG tablet Take 0.5 tablets (100 mg total) by mouth daily. 180 tablet 3   aspirin EC 81 MG tablet Take 1 tablet (81 mg total) by mouth daily. Swallow whole. 30 tablet 0   atorvastatin (LIPITOR) 20 MG tablet TAKE 1 TABLET BY MOUTH EVERYDAY AT BEDTIME 90 tablet 1   dapagliflozin propanediol (FARXIGA) 10 MG TABS tablet Take 1 tablet (10 mg total) by mouth daily before breakfast. 30 tablet 11   DULoxetine (CYMBALTA) 20 MG capsule TAKE 1 CAPSULE (20 MG TOTAL) BY MOUTH DAILY. FOR ANXIETY AND PAIN. 90 capsule 2   feeding supplement, ENSURE ENLIVE, (ENSURE ENLIVE) LIQD Take 237 mLs by mouth 2 (two) times daily between meals.     ferrous sulfate 325 (65 FE) MG EC tablet Take 1 tablet (325 mg total) by mouth daily. 60 tablet 3   fluticasone-salmeterol (ADVAIR DISKUS) 250-50 MCG/ACT AEPB INHALE 1 PUFF INTO THE LUNGS TWICE A DAY 60 each 5   levothyroxine (SYNTHROID) 25 MCG tablet Take 1 tablet (25 mcg total) by mouth daily before breakfast. 30 tablet 3   metoprolol succinate (TOPROL-XL) 25 MG 24 hr tablet Take 1 tablet (25 mg total) by mouth daily. 90 tablet 1   nitroGLYCERIN (NITROSTAT) 0.4 MG SL tablet Place 1 tablet (0.4 mg total) under the tongue every 5 (five) minutes as needed for chest pain. 20 tablet 5   omeprazole (PRILOSEC) 40 MG capsule Take 1  capsule (40 mg total) by mouth daily. For heartburn. 90 capsule 1   Pediatric Multiple Vitamins (FLINTSTONES MULTIVITAMIN) CHEW Chew by mouth.     potassium chloride SA (KLOR-CON M20) 20 MEQ tablet Take 2 tablets (40 mEq total) by mouth 2 (two) times daily. 120 tablet 3   Probiotic Product (PROBIOTIC ADVANCED) CAPS Take 1 capsule by mouth daily.     spironolactone (ALDACTONE) 25 MG tablet Take 1 tablet (25 mg total) by mouth 2 (two) times daily. 180 tablet 3   torsemide (DEMADEX) 20 MG tablet Take 3 tablets (60 mg total) by mouth 2 (two) times daily. 120 tablet 3   trolamine salicylate (ASPERCREME) 10 % cream Apply 1 application  topically daily as needed for muscle pain (back or joint pain).     vitamin B-12 (CYANOCOBALAMIN) 500 MCG tablet Take 500 mcg by mouth 4 (four) times a week. 4 times weekly no more than 2000 MCG per week     Current Facility-Administered Medications  Medication Dose Route Frequency Provider Last Rate Last Admin   albumin human 25 % solution 25 g  25 g Intravenous Once Wyline Mood, MD       Vitals:   06/12/23 0905  BP: 109/61  Pulse: (!) 57  SpO2: 100%  Weight: 128 lb 12.8 oz (58.4 kg)   Wt Readings from Last 3 Encounters:  06/12/23 128 lb 12.8 oz (58.4 kg)  05/08/23 125 lb 6 oz (56.9 kg)  04/20/23 132 lb 4.8 oz (60 kg)   Lab Results  Component Value Date   CREATININE 1.62 (H) 05/22/2023   CREATININE 1.88 (H) 05/08/2023   CREATININE 1.78 (H) 04/17/2023    PHYSICAL EXAM:  General:  Well appearing. No resp difficulty HEENT: normal Neck: supple. JVP elevated with HJR. No lymphadenopathy or thryomegaly appreciated. Cor: PMI normal. Regular rhythm, bradycardic. No rubs, gallops, 2/6 HSM at apex Lungs: clear Abdomen: soft, nontender, mild distention No hepatosplenomegaly. No bruits or masses.  Extremities: no cyanosis, clubbing, rash, edema Neuro: alert & oriented x3, cranial nerves grossly intact. Moves all 4  extremities w/o difficulty. Affect  pleasant.   ECG: not done   ASSESSMENT & PLAN:  1: Chronic heart failure with reduced ejection fraction- - suspect mixed ischemic and valvular disease - NYHA class III - euvolemic today - weighing daily; reminded to call for an overnight weight gain of > 2 pounds or a weekly weight gain of > 5 pounds - weight up 3 pounds since her last visit here 6 weeks ago - TEE 03/18/23: EF 40% with moderate LAE/ severe RAE and severe MR. No PFO or ASD.  - Echo 08/22/22: EF of 30-35% along with mild LVH and severe MR/TR.  - Echo 02/07/21: EF of 40-45% along with severely elevated PA pressure of 69.2 mmHg, moderate LAE, severe MR, mild/ moderate Anita and moderate/severe TR.  - not adding salt and daughter says that she doesn't cook with salt either - saw [facility] provider Shirlee Latch) 05/24 - continue metoprolol succinate 25mg  daily - continue farxiga 10mg  daily - continue spironolactone 25mg  daily - continue torsemide 60mg  BID/ potassium BID - had been hypotensive with entresto in the past - BMP/ BNP next visit - palliative care visit done 09/10/22 - BNP 04/17/23 was 2260.8  2: HTN- - BP 109/61 - saw PCP Chestine Spore) 10/23 - BMP 05/22/23 reviewed and showed sodium 137, potassium 4.2, creatinine 1.62 & GFR 36   3: CAD- - saw cardiology Dan Humphreys) 10/23 - continue atorvastatin  - continue ASA 81mg  daily - LHC done 03/23/20 showed: Hemodynamics: LV end diastolic pressure is moderately elevated. ----Coronary Angiography----- Ost RCA to Dist RCA lesion is 100% stenosed.-The PDA and PL system fills via faint collaterals from the AV groove LCx and LAD septals. Mid LAD-1 lesion is 60% stenosed. Mid LAD-2 lesion is 50% stenosed. Dist LAD lesion is 55% stenosed with 70% stenosed side branch in 2nd Diag. 1st Diag lesion is 70% stenosed. Ost Cx to Prox Cx lesion is 45% stenosed. Prox-MID Cx lesion is 65% stenosed.  MODERATE-SEVERE THREE-VESSEL CAD: 100% proximal RCA occlusion with left-to-right collaterals faintly  filling PDA and PL system (AV groove LCx-PL and LAD septal-PDA) Diffuse moderate mid LAD disease with 60% to 50% stenosis. Tandem 50% and 65% proximal and mid LCx with the 65% lesion being the most significant lesion. Moderately elevated LVEDP of 18-20 mmHg  4: NSVT- - continue amiodarone 100mg  daily - required adenosine to terminate 10/23 - needs regular eye exams - TSH 05/22/23 was 14.093 - LDL 01/01/23 was 56  5: Cirrhosis w/ ascites- - suspect cardiogenic cirrhosis due to RV failure - periodic paracentesis; last one done 05/24 with removal of 6.2L; If the patient eventually requires >/=2 paracenteses in a 30 day period, candidacy for formal evaluation by the [facility] will be assessed - saw GI Tobi Bastos) 01/24  6: Valvular heart disease- - TEE 03/18/23: EF 40% with moderate LAE/ severe RAE and severe MR. No PFO or ASD - does not qualifiy for mTEER due to cirrhosis, Anita and fraility. Dr Shirlee Latch discussed with structural heart team and even if it could be done, this would not fix her TR which is also a large contributing factor - eventual hospice referral has previously been discussed  Return in 2 months, sooner if needed.

## 2023-06-15 ENCOUNTER — Ambulatory Visit: Payer: Medicare Other | Admitting: Neurology

## 2023-06-29 ENCOUNTER — Other Ambulatory Visit: Payer: Self-pay | Admitting: Cardiology

## 2023-06-29 DIAGNOSIS — I5020 Unspecified systolic (congestive) heart failure: Secondary | ICD-10-CM

## 2023-07-18 ENCOUNTER — Other Ambulatory Visit (HOSPITAL_BASED_OUTPATIENT_CLINIC_OR_DEPARTMENT_OTHER): Payer: Self-pay | Admitting: Family

## 2023-07-18 ENCOUNTER — Other Ambulatory Visit: Payer: Self-pay | Admitting: Primary Care

## 2023-07-18 DIAGNOSIS — I471 Supraventricular tachycardia, unspecified: Secondary | ICD-10-CM

## 2023-07-18 DIAGNOSIS — I5042 Chronic combined systolic (congestive) and diastolic (congestive) heart failure: Secondary | ICD-10-CM

## 2023-07-18 DIAGNOSIS — I4729 Other ventricular tachycardia: Secondary | ICD-10-CM

## 2023-07-18 DIAGNOSIS — K219 Gastro-esophageal reflux disease without esophagitis: Secondary | ICD-10-CM

## 2023-07-18 DIAGNOSIS — I255 Ischemic cardiomyopathy: Secondary | ICD-10-CM

## 2023-07-19 NOTE — Telephone Encounter (Signed)
 Patient is due for follow up in October, this will be required prior to any further refills.  Please schedule, thank you!

## 2023-07-20 NOTE — Telephone Encounter (Signed)
LVM for patient to c/b and schedule.  

## 2023-07-27 ENCOUNTER — Other Ambulatory Visit: Payer: Self-pay

## 2023-07-27 DIAGNOSIS — D509 Iron deficiency anemia, unspecified: Secondary | ICD-10-CM

## 2023-07-28 ENCOUNTER — Inpatient Hospital Stay: Payer: Medicare Other | Attending: Oncology

## 2023-07-28 DIAGNOSIS — K219 Gastro-esophageal reflux disease without esophagitis: Secondary | ICD-10-CM | POA: Diagnosis not present

## 2023-07-28 DIAGNOSIS — R110 Nausea: Secondary | ICD-10-CM | POA: Insufficient documentation

## 2023-07-28 DIAGNOSIS — R5383 Other fatigue: Secondary | ICD-10-CM | POA: Insufficient documentation

## 2023-07-28 DIAGNOSIS — I252 Old myocardial infarction: Secondary | ICD-10-CM | POA: Insufficient documentation

## 2023-07-28 DIAGNOSIS — I2510 Atherosclerotic heart disease of native coronary artery without angina pectoris: Secondary | ICD-10-CM | POA: Insufficient documentation

## 2023-07-28 DIAGNOSIS — Z79899 Other long term (current) drug therapy: Secondary | ICD-10-CM | POA: Diagnosis not present

## 2023-07-28 DIAGNOSIS — Z885 Allergy status to narcotic agent status: Secondary | ICD-10-CM | POA: Diagnosis not present

## 2023-07-28 DIAGNOSIS — Z808 Family history of malignant neoplasm of other organs or systems: Secondary | ICD-10-CM | POA: Diagnosis not present

## 2023-07-28 DIAGNOSIS — Z79890 Hormone replacement therapy (postmenopausal): Secondary | ICD-10-CM | POA: Insufficient documentation

## 2023-07-28 DIAGNOSIS — J449 Chronic obstructive pulmonary disease, unspecified: Secondary | ICD-10-CM | POA: Insufficient documentation

## 2023-07-28 DIAGNOSIS — D631 Anemia in chronic kidney disease: Secondary | ICD-10-CM | POA: Insufficient documentation

## 2023-07-28 DIAGNOSIS — N1831 Chronic kidney disease, stage 3a: Secondary | ICD-10-CM | POA: Diagnosis not present

## 2023-07-28 DIAGNOSIS — I5042 Chronic combined systolic (congestive) and diastolic (congestive) heart failure: Secondary | ICD-10-CM | POA: Diagnosis not present

## 2023-07-28 DIAGNOSIS — I255 Ischemic cardiomyopathy: Secondary | ICD-10-CM | POA: Insufficient documentation

## 2023-07-28 DIAGNOSIS — K648 Other hemorrhoids: Secondary | ICD-10-CM | POA: Diagnosis not present

## 2023-07-28 DIAGNOSIS — Z818 Family history of other mental and behavioral disorders: Secondary | ICD-10-CM | POA: Diagnosis not present

## 2023-07-28 DIAGNOSIS — Z8049 Family history of malignant neoplasm of other genital organs: Secondary | ICD-10-CM | POA: Insufficient documentation

## 2023-07-28 DIAGNOSIS — Z888 Allergy status to other drugs, medicaments and biological substances status: Secondary | ICD-10-CM | POA: Diagnosis not present

## 2023-07-28 DIAGNOSIS — Z87891 Personal history of nicotine dependence: Secondary | ICD-10-CM | POA: Insufficient documentation

## 2023-07-28 DIAGNOSIS — R188 Other ascites: Secondary | ICD-10-CM | POA: Diagnosis not present

## 2023-07-28 DIAGNOSIS — Z801 Family history of malignant neoplasm of trachea, bronchus and lung: Secondary | ICD-10-CM | POA: Diagnosis not present

## 2023-07-28 DIAGNOSIS — D509 Iron deficiency anemia, unspecified: Secondary | ICD-10-CM

## 2023-07-28 DIAGNOSIS — G35 Multiple sclerosis: Secondary | ICD-10-CM | POA: Diagnosis not present

## 2023-07-28 DIAGNOSIS — E538 Deficiency of other specified B group vitamins: Secondary | ICD-10-CM | POA: Diagnosis not present

## 2023-07-28 DIAGNOSIS — Z8249 Family history of ischemic heart disease and other diseases of the circulatory system: Secondary | ICD-10-CM | POA: Insufficient documentation

## 2023-07-28 LAB — CBC WITH DIFFERENTIAL (CANCER CENTER ONLY)
Abs Immature Granulocytes: 0.03 10*3/uL (ref 0.00–0.07)
Basophils Absolute: 0.1 10*3/uL (ref 0.0–0.1)
Basophils Relative: 1 %
Eosinophils Absolute: 0.2 10*3/uL (ref 0.0–0.5)
Eosinophils Relative: 4 %
HCT: 34.5 % — ABNORMAL LOW (ref 36.0–46.0)
Hemoglobin: 10.5 g/dL — ABNORMAL LOW (ref 12.0–15.0)
Immature Granulocytes: 1 %
Lymphocytes Relative: 5 %
Lymphs Abs: 0.4 10*3/uL — ABNORMAL LOW (ref 0.7–4.0)
MCH: 29.8 pg (ref 26.0–34.0)
MCHC: 30.4 g/dL (ref 30.0–36.0)
MCV: 98 fL (ref 80.0–100.0)
Monocytes Absolute: 0.6 10*3/uL (ref 0.1–1.0)
Monocytes Relative: 10 %
Neutro Abs: 5.3 10*3/uL (ref 1.7–7.7)
Neutrophils Relative %: 79 %
Platelet Count: 176 10*3/uL (ref 150–400)
RBC: 3.52 MIL/uL — ABNORMAL LOW (ref 3.87–5.11)
RDW: 17.2 % — ABNORMAL HIGH (ref 11.5–15.5)
WBC Count: 6.7 10*3/uL (ref 4.0–10.5)
nRBC: 0 % (ref 0.0–0.2)

## 2023-07-28 LAB — IRON AND TIBC
Iron: 42 ug/dL (ref 28–170)
Saturation Ratios: 13 % (ref 10.4–31.8)
TIBC: 325 ug/dL (ref 250–450)
UIBC: 283 ug/dL

## 2023-07-28 LAB — FERRITIN: Ferritin: 117 ng/mL (ref 11–307)

## 2023-07-31 ENCOUNTER — Inpatient Hospital Stay: Payer: Medicare Other

## 2023-07-31 ENCOUNTER — Encounter: Payer: Self-pay | Admitting: Oncology

## 2023-07-31 ENCOUNTER — Inpatient Hospital Stay: Payer: Medicare Other | Admitting: Oncology

## 2023-07-31 VITALS — BP 116/78 | HR 63

## 2023-07-31 VITALS — BP 113/71 | HR 61 | Temp 97.8°F | Resp 18 | Wt 136.5 lb

## 2023-07-31 DIAGNOSIS — G35 Multiple sclerosis: Secondary | ICD-10-CM | POA: Diagnosis not present

## 2023-07-31 DIAGNOSIS — N1832 Chronic kidney disease, stage 3b: Secondary | ICD-10-CM

## 2023-07-31 DIAGNOSIS — E538 Deficiency of other specified B group vitamins: Secondary | ICD-10-CM | POA: Diagnosis not present

## 2023-07-31 DIAGNOSIS — K648 Other hemorrhoids: Secondary | ICD-10-CM | POA: Diagnosis not present

## 2023-07-31 DIAGNOSIS — D509 Iron deficiency anemia, unspecified: Secondary | ICD-10-CM

## 2023-07-31 DIAGNOSIS — D631 Anemia in chronic kidney disease: Secondary | ICD-10-CM | POA: Diagnosis not present

## 2023-07-31 DIAGNOSIS — N1831 Chronic kidney disease, stage 3a: Secondary | ICD-10-CM | POA: Diagnosis not present

## 2023-07-31 DIAGNOSIS — I252 Old myocardial infarction: Secondary | ICD-10-CM | POA: Diagnosis not present

## 2023-07-31 MED ORDER — SODIUM CHLORIDE 0.9 % IV SOLN
Freq: Once | INTRAVENOUS | Status: AC
  Filled 2023-07-31: qty 250

## 2023-07-31 MED ORDER — SODIUM CHLORIDE 0.9 % IV SOLN
200.0000 mg | Freq: Once | INTRAVENOUS | Status: AC
  Administered 2023-07-31: 200 mg via INTRAVENOUS
  Filled 2023-07-31: qty 200

## 2023-07-31 NOTE — Progress Notes (Signed)
Hematology/Oncology Progress note [phone] [phone]      Patient Care Team: Doreene Nest, NP as PCP - General (Internal Medicine) Jodelle Red, MD as PCP - Cardiology (Cardiology) Rickard Patience, MD as Consulting Physician (Hematology and Oncology) Drema Dallas, DO as Consulting Physician (Neurology)  ASSESSMENT & PLAN:   Anemia due to stage 3a chronic kidney disease (HCC) #Anemia, combination of iron deficiency as well as chronic kidney disease. Labs reviewed and discussed with patient. Lab Results  Component Value Date   HGB 10.5 (L) 07/28/2023   TIBC 325 07/28/2023   IRONPCTSAT 13 07/28/2023   FERRITIN 117 07/28/2023    Plan IV venofer weekly x 2 to further improve her iron store No need for EPO replacement.   Vitamin B12 deficiency continue vitamin B12  3 times per week.   CKD (chronic kidney disease) stage 3, GFR 30-59 ml/min (HCC) Encourage oral hydration and avoid nephrotoxins.    Orders Placed This Encounter  Procedures   CBC with Differential (Cancer Center Only)    Standing Status:   Future    Standing Expiration Date:   07/30/2024   Iron and TIBC    Standing Status:   Future    Standing Expiration Date:   07/30/2024   Ferritin    Standing Status:   Future    Standing Expiration Date:   07/30/2024   Retic Panel    Standing Status:   Future    Standing Expiration Date:   07/30/2024   Follow-up in 4 months. All questions were answered. The patient knows to call the clinic with any problems, questions or concerns.  Rickard Patience, MD, PhD [facility] Hematology Oncology 07/31/2023    CHIEF COMPLAINTS/REASON FOR VISIT:  Follow up of anemia  HISTORY OF PRESENTING ILLNESS:  Anita [name] is a  62 y.o.  female with PMH listed below who was referred to me for evaluation of anemia Reviewed patient's recent labs that was done.  05/03/2020 labs revealed anemia with hemoglobin of 8.9, MCV 82.9 07/02/2020, iron saturation  11, ferritin 91, TIBC 307, iron 34. Reviewed patient's previous labs , anemia is chronic onset , duration is since April 2021.  She did not have much baseline blood work prior to April 2021.  She did have some remote blood work that was done in 2012 when she had a normal hemoglobin of 13.5. Patient has multiple medical problems.  She is a poor historian.  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 Patient was hospitalized in April 2021 due to acute respiratory failure with hypoxia, due to atypical pneumonia, non-STEMI, severe three-vessel disease and pulmonary hypertension, AKI. 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 Pierce Crane in 2007.  Patient declined pursuing further diagnosis or therapeutic options. She also has a chronic history of MS.  Patient was recently seen by gastroenterology and had colonoscopy done. 5 mm transverse colon polyp which was resected and retrieved.  Diverticulosis.  Nonbleeding internal hemorrhoids. Patient denies any bloody or black bowel movement.  She continues to have chronic nausea with no vomiting. Appetite is not good.  Weight has been relatively stable.  She also reports bilateral upper extremity ecchymosis which is a chronic issue for her.  + CHF, ascites, she gets therapeutic paracentesis.   INTERVAL HISTORY Anita [name] is a 62 y.o. female who has above history reviewed by me today presents for follow  up visit for management of Anemia Patient was accompanied by daughter. + chronic fatigue.  Improved.  She tolerated IV Venofer treatments.   Review of Systems  Constitutional:  Positive for fatigue. Negative for appetite change, chills, fever and unexpected weight change.  HENT:   Negative for hearing loss and voice change.   Eyes:  Negative for eye problems.  Respiratory:  Negative for chest tightness and cough.   Cardiovascular:  Negative  for chest pain.  Gastrointestinal:  Negative for abdominal distention, abdominal pain, blood in stool and nausea.  Endocrine: Negative for hot flashes.  Genitourinary:  Negative for difficulty urinating and frequency.   Musculoskeletal:  Negative for arthralgias.  Skin:  Negative for itching and rash.  Neurological:  Negative for extremity weakness.  Hematological:  Negative for adenopathy. Bruises/bleeds easily.  Psychiatric/Behavioral:  Negative for confusion.      MEDICAL HISTORY:  Past Medical History:  Diagnosis Date   Abnormality of gait 07/26/2013   Acute respiratory failure with hypoxia (HCC) 03/20/2020   Allergy    Arthritis    Atypical pneumonia 03/22/2020   CAD (coronary artery disease)    a. 03/2020 Cath: LM nl, LAD 60/35m, 55d, D1 70, D2 70, LCX 45ost/p, 65p, RCA 100ost CTO. RPDA fills via collats from dLAD. Inf septal fills via collats from 1st septal, RPAV fills via collats from LPAV, RPL1/2 sev dzs-->med rx.   Chickenpox    Chronic combined systolic (congestive) and diastolic (congestive) heart failure (HCC)    a. 02/2021 Echo: EF 40-45%, glob HK, gr2 DD. Sev red RV fxn, RVSP 69.37mmHg. Mod dil LA, sev dil RA. Sev MR. Mild to mod MS. Mean MV grad 6.76mmHg. Mod-Sev TR. Mild AI. Mild Ao sclerosis w/o stenosis.   COPD (chronic obstructive pulmonary disease) (HCC)    Elevated troponin 03/22/2020   GERD (gastroesophageal reflux disease)    Headache(784.0)    Migraine   Hearing loss    Right ear secondary to infection   History of shingles    Ischemic cardiomyopathy    a. 02/2021 Echo: EF 40-45%, glob HK.   Migraines    Multiple sclerosis (HCC)    Obese    Optic neuritis    PAH (pulmonary artery hypertension) (HCC)    a. 02/2021 Echo: RVSP 69.44mmHg.   Prolonged QT interval 03/20/2020   Severe mitral regurgitation    a. 02/2021 Echo: Severe MR w/ mild to mod MS.  Mean MV grad 6.35mmHg.    SURGICAL HISTORY: Past Surgical History:  Procedure Laterality Date    COLONOSCOPY WITH PROPOFOL N/A 08/03/2020   Procedure: COLONOSCOPY WITH PROPOFOL;  Surgeon: Wyline Mood, MD;  Location[facility] ENDOSCOPY;  Service: Gastroenterology;  Laterality: N/A;   Ganglioneuroma     Resection   LEFT HEART CATH AND CORONARY ANGIOGRAPHY N/A 03/23/2020   Procedure: LEFT HEART CATH AND CORONARY ANGIOGRAPHY;  Surgeon: Marykay Lex, MD;  Location: [facility] INVASIVE CV LAB;  Service: Cardiovascular;  Laterality: N/A;   PILONIDAL CYST EXCISION     PILONIDAL CYST EXCISION  1983   TEE WITHOUT CARDIOVERSION N/A 03/18/2023   Procedure: TRANSESOPHAGEAL ECHOCARDIOGRAM (TEE);  Surgeon: Laurey Morale, MD;  Location: [facility] ORS;  Service: Cardiovascular;  Laterality: N/A;   TUMOR REMOVAL  2007/2008    SOCIAL HISTORY: Social History   Socioeconomic History   Marital status: Divorced    Spouse name: Not on file   Number of children: 2   Years of education: Not on file   Highest education level: Not on  file  Occupational History   Not on file  Tobacco Use   Smoking status: Former    Current packs/day: 1.00    Types: Cigarettes   Smokeless tobacco: Never  Vaping Use   Vaping status: Never Used  Substance and Sexual Activity   Alcohol use: Not Currently    Comment: Occasional   Drug use: Never   Sexual activity: Not on file  Other Topics Concern   Not on file  Social History Narrative   Right handed    Lives in one story home   Drinks Caffeine - Sweet Tea    Social Determinants of Health   Financial Resource Strain: Low Risk  (07/08/2022)   Overall Financial Resource Strain (CARDIA)    Difficulty of Paying Living Expenses: Not hard at all  Food Insecurity: No Food Insecurity (09/17/2022)   Hunger Vital Sign    Worried About Running Out of Food in the Last Year: Never true    Ran Out of Food in the Last Year: Never true  Transportation Needs: No Transportation Needs (09/17/2022)   PRAPARE - Administrator, Civil Service (Medical): No    Lack of Transportation  (Non-Medical): No  Physical Activity: Inactive (07/08/2022)   Exercise Vital Sign    Days of Exercise per Week: 0 days    Minutes of Exercise per Session: 0 min  Stress: No Stress Concern Present (07/08/2022)   [employer] of Occupational Health - Occupational Stress Questionnaire    Feeling of Stress : Not at all  Social Connections: Socially Isolated (07/08/2022)   Social Connection and Isolation Panel [NHANES]    Frequency of Communication with Friends and Family: Three times a week    Frequency of Social Gatherings with Friends and Family: Never    Attends Religious Services: Never    Database administrator or Organizations: No    Attends Banker Meetings: Never    Marital Status: Divorced  Catering manager Violence: Not At Risk (09/17/2022)   Humiliation, Afraid, Rape, and Kick questionnaire    Fear of Current or Ex-Partner: No    Emotionally Abused: No    Physically Abused: No    Sexually Abused: No    FAMILY HISTORY: Family History  Problem Relation Age of Onset   Skin cancer Mother    Lung cancer Father    Uterine cancer Sister    Heart disease Brother    Bipolar disorder Sister    Throat cancer Paternal Grandmother     ALLERGIES:  is allergic to acthar hp [corticotropin], codeine, and prednisone.  MEDICATIONS:  Current Outpatient Medications  Medication Sig Dispense Refill   albuterol (VENTOLIN HFA) 108 (90 Base) MCG/ACT inhaler INHALE 1-2 PUFFS INTO THE LUNGS EVERY 6 (SIX) HOURS AS NEEDED FOR WHEEZING OR SHORTNESS OF BREATH. USE SPARINGLY! 18 each 0   amiodarone (PACERONE) 200 MG tablet Take 0.5 tablets (100 mg total) by mouth daily. 180 tablet 3   aspirin EC 81 MG tablet Take 1 tablet (81 mg total) by mouth daily. Swallow whole. 30 tablet 0   atorvastatin (LIPITOR) 20 MG tablet TAKE 1 TABLET BY MOUTH EVERYDAY AT BEDTIME 90 tablet 1   dapagliflozin propanediol (FARXIGA) 10 MG TABS tablet Take 1 tablet (10 mg total) by mouth daily before breakfast. 30  tablet 11   DULoxetine (CYMBALTA) 20 MG capsule TAKE 1 CAPSULE (20 MG TOTAL) BY MOUTH DAILY. FOR ANXIETY AND PAIN. 90 capsule 2   feeding supplement, ENSURE ENLIVE, (ENSURE ENLIVE)  LIQD Take 237 mLs by mouth 2 (two) times daily between meals. (Patient taking differently: Take 237 mLs by mouth 2 (two) times daily between meals.)     fluticasone-salmeterol (ADVAIR DISKUS) 250-50 MCG/ACT AEPB INHALE 1 PUFF INTO THE LUNGS TWICE A DAY 60 each 5   KLOR-CON M20 20 MEQ tablet TAKE 2 TABLETS BY MOUTH 2 TIMES DAILY. 360 tablet 1   levothyroxine (SYNTHROID) 25 MCG tablet Take 1 tablet (25 mcg total) by mouth daily before breakfast. 30 tablet 3   metoprolol succinate (TOPROL-XL) 25 MG 24 hr tablet TAKE 1 TABLET (25 MG TOTAL) BY MOUTH DAILY. 90 tablet 3   omeprazole (PRILOSEC) 40 MG capsule TAKE 1 CAPSULE (40 MG TOTAL) BY MOUTH DAILY. FOR HEARTBURN. 90 capsule 0   Pediatric Multiple Vitamins (FLINTSTONES MULTIVITAMIN) CHEW Chew by mouth.     Probiotic Product (PROBIOTIC ADVANCED) CAPS Take 1 capsule by mouth daily.     spironolactone (ALDACTONE) 25 MG tablet Take 1 tablet (25 mg total) by mouth 2 (two) times daily. 180 tablet 3   torsemide (DEMADEX) 20 MG tablet Take 3 tablets (60 mg total) by mouth 2 (two) times daily. 120 tablet 3   trolamine salicylate (ASPERCREME) 10 % cream Apply 1 application  topically daily as needed for muscle pain (back or joint pain).     vitamin B-12 (CYANOCOBALAMIN) 500 MCG tablet Take 500 mcg by mouth 4 (four) times a week. 4 times weekly no more than 2000 MCG per week     ferrous sulfate 325 (65 FE) MG EC tablet Take 1 tablet (325 mg total) by mouth daily. (Patient not taking: Reported on 07/31/2023) 60 tablet 3   nitroGLYCERIN (NITROSTAT) 0.4 MG SL tablet Place 1 tablet (0.4 mg total) under the tongue every 5 (five) minutes as needed for chest pain. (Patient not taking: Reported on 06/12/2023) 20 tablet 5   Current Facility-Administered Medications  Medication Dose Route Frequency  Provider Last Rate Last Admin   albumin human 25 % solution 25 g  25 g Intravenous Once Wyline Mood, MD         PHYSICAL EXAMINATION: ECOG PERFORMANCE STATUS: 2 - Symptomatic, <50% confined to bed Vitals:   07/31/23 1211  BP: 113/71  Pulse: 61  Resp: 18  Temp: 97.8 F (36.6 C)  SpO2: 97%   Filed Weights   07/31/23 1211  Weight: 136 lb 8 oz (61.9 kg)    Physical Exam Constitutional:      General: She is not in acute distress.    Comments: Patient sits in the wheelchair  HENT:     Head: Normocephalic and atraumatic.  Eyes:     General: No scleral icterus. Cardiovascular:     Rate and Rhythm: Normal rate and regular rhythm.     Heart sounds: Murmur heard.  Pulmonary:     Effort: Pulmonary effort is normal. No respiratory distress.     Breath sounds: No wheezing.     Comments: Decreased breath sound bilaterally. Abdominal:     General: Bowel sounds are normal. There is distension.     Palpations: Abdomen is soft.  Musculoskeletal:        General: No deformity. Normal range of motion.     Cervical back: Normal range of motion and neck supple.  Skin:    General: Skin is warm and dry.     Comments: Bilateral upper extremity ecchymosis.  Neurological:     Mental Status: She is alert and oriented to person, place, and time. Mental  status is at baseline.  Psychiatric:        Mood and Affect: Mood normal.      LABORATORY DATA:  I have reviewed the data as listed    Latest Ref Rng & Units 07/28/2023   11:42 AM 05/22/2023    2:52 PM 04/15/2023    2:24 PM  CBC  WBC 4.0 - 10.5 K/uL 6.7  7.1  7.0   Hemoglobin 12.0 - 15.0 g/dL 40.9  81.1  9.7   Hematocrit 36.0 - 46.0 % 34.5  37.4  32.9   Platelets 150 - 400 K/uL 176  199  201       Latest Ref Rng & Units 05/22/2023    2:52 PM 05/08/2023   10:57 AM 04/17/2023   11:12 AM  CMP  Glucose 70 - 99 mg/dL 88  914  81   BUN 6 - 20 mg/dL 40  45  32   Creatinine 0.44 - 1.00 mg/dL 7.82  9.56  2.13   Sodium 135 - 145 mmol/L 137   135  135   Potassium 3.5 - 5.1 mmol/L 4.2  4.5  4.9   Chloride 98 - 111 mmol/L 112  105  109   CO2 22 - 32 mmol/L 16  23  20    Calcium 8.9 - 10.3 mg/dL 8.8  8.8  8.5   Total Protein 6.5 - 8.1 g/dL   7.3   Total Bilirubin 0.3 - 1.2 mg/dL   0.7   Alkaline Phos 38 - 126 U/L   100   AST 15 - 41 U/L   23   ALT 0 - 44 U/L   17      Iron/TIBC/Ferritin/ %Sat    Component Value Date/Time   IRON 42 07/28/2023 1142   IRON 39 12/25/2022 1416   TIBC 325 07/28/2023 1142   TIBC 288 12/25/2022 1416   FERRITIN 117 07/28/2023 1142   FERRITIN 152 (H) 12/25/2022 1416   IRONPCTSAT 13 07/28/2023 1142   IRONPCTSAT 14 (L) 12/25/2022 1416

## 2023-07-31 NOTE — Assessment & Plan Note (Addendum)
#  Anemia, combination of iron deficiency as well as chronic kidney disease. Labs reviewed and discussed with patient. Lab Results  Component Value Date   HGB 10.5 (L) 07/28/2023   TIBC 325 07/28/2023   IRONPCTSAT 13 07/28/2023   FERRITIN 117 07/28/2023    Plan IV venofer weekly x 2 to further improve her iron store No need for EPO replacement.

## 2023-07-31 NOTE — Assessment & Plan Note (Signed)
Encourage oral hydration and avoid nephrotoxins.   

## 2023-07-31 NOTE — Assessment & Plan Note (Signed)
continue vitamin B12  3 times per week.

## 2023-07-31 NOTE — Patient Instructions (Signed)
[facility]  Discharge Instructions: Thank you for choosing [facility] to provide your oncology and hematology care.  If you have a lab appointment with the Cancer Center, please go directly to the Cancer Center and check in at the registration area.  Wear comfortable clothing and clothing appropriate for easy access to any Portacath or PICC line.   We strive to give you quality time with your provider. You may need to reschedule your appointment if you arrive late (15 or more minutes).  Arriving late affects you and other patients whose appointments are after yours.  Also, if you miss three or more appointments without notifying the office, you may be dismissed from the clinic at the provider's discretion.      For prescription refill requests, have your pharmacy contact our office and allow 72 hours for refills to be completed.    Today you received the following chemotherapy and/or immunotherapy agents Venofer.      To help prevent nausea and vomiting after your treatment, we encourage you to take your nausea medication as directed.  BELOW ARE SYMPTOMS THAT SHOULD BE REPORTED IMMEDIATELY: *FEVER GREATER THAN 100.4 F (38 C) OR HIGHER *CHILLS OR SWEATING *NAUSEA AND VOMITING THAT IS NOT CONTROLLED WITH YOUR NAUSEA MEDICATION *UNUSUAL SHORTNESS OF BREATH *UNUSUAL BRUISING OR BLEEDING *URINARY PROBLEMS (pain or burning when urinating, or frequent urination) *BOWEL PROBLEMS (unusual diarrhea, constipation, pain near the anus) TENDERNESS IN MOUTH AND THROAT WITH OR WITHOUT PRESENCE OF ULCERS (sore throat, sores in mouth, or a toothache) UNUSUAL RASH, SWELLING OR PAIN  UNUSUAL VAGINAL DISCHARGE OR ITCHING   Items with * indicate a potential emergency and should be followed up as soon as possible or go to the Emergency Department if any problems should occur.  Please show the CHEMOTHERAPY ALERT CARD or IMMUNOTHERAPY ALERT CARD at check-in to  the Emergency Department and triage nurse.  Should you have questions after your visit or need to cancel or reschedule your appointment, please contact [facility]  [phone] and follow the prompts.  Office hours are 8:00 a.m. to 4:30 p.m. Monday - Friday. Please note that voicemails left after 4:00 p.m. may not be returned until the following business day.  We are closed weekends and major holidays. You have access to a nurse at all times for urgent questions. Please call the main number to the clinic [phone] and follow the prompts.  For any non-urgent questions, you may also contact your provider using MyChart. We now offer e-Visits for anyone 18 and older to request care online for non-urgent symptoms. For details visit [email or web address].   Also download the MyChart app! Go to the app store, search "MyChart", open the app, select [facility], and log in with your MyChart username and password.   

## 2023-08-07 ENCOUNTER — Inpatient Hospital Stay: Payer: Medicare Other

## 2023-08-07 VITALS — BP 108/60 | HR 57 | Resp 18

## 2023-08-07 DIAGNOSIS — I252 Old myocardial infarction: Secondary | ICD-10-CM | POA: Diagnosis not present

## 2023-08-07 DIAGNOSIS — D631 Anemia in chronic kidney disease: Secondary | ICD-10-CM | POA: Diagnosis not present

## 2023-08-07 DIAGNOSIS — D509 Iron deficiency anemia, unspecified: Secondary | ICD-10-CM

## 2023-08-07 DIAGNOSIS — N1831 Chronic kidney disease, stage 3a: Secondary | ICD-10-CM | POA: Diagnosis not present

## 2023-08-07 DIAGNOSIS — E538 Deficiency of other specified B group vitamins: Secondary | ICD-10-CM | POA: Diagnosis not present

## 2023-08-07 DIAGNOSIS — G35 Multiple sclerosis: Secondary | ICD-10-CM | POA: Diagnosis not present

## 2023-08-07 DIAGNOSIS — K648 Other hemorrhoids: Secondary | ICD-10-CM | POA: Diagnosis not present

## 2023-08-07 MED ORDER — SODIUM CHLORIDE 0.9 % IV SOLN
200.0000 mg | Freq: Once | INTRAVENOUS | Status: AC
  Administered 2023-08-07: 200 mg via INTRAVENOUS
  Filled 2023-08-07: qty 200

## 2023-08-07 MED ORDER — SODIUM CHLORIDE 0.9 % IV SOLN
Freq: Once | INTRAVENOUS | Status: AC
  Filled 2023-08-07: qty 250

## 2023-08-07 NOTE — Patient Instructions (Signed)
 Iron Sucrose Injection What is this medication? IRON SUCROSE (EYE ern SOO krose) treats low levels of iron (iron deficiency anemia) in people with kidney disease. Iron is a mineral that plays an important role in making red blood cells, which carry oxygen from your lungs to the rest of your body. This medicine may be used for other purposes; ask your health care provider or pharmacist if you have questions. COMMON BRAND NAME(S): Venofer What should I tell my care team before I take this medication? They need to know if you have any of these conditions: Anemia not caused by low iron levels Heart disease High levels of iron in the blood Kidney disease Liver disease An unusual or allergic reaction to iron, other medications, foods, dyes, or preservatives Pregnant or trying to get pregnant Breastfeeding How should I use this medication? This medication is for infusion into a vein. It is given in a hospital or clinic setting. Talk to your care team about the use of this medication in children. While this medication may be prescribed for children as young as 2 years for selected conditions, precautions do apply. Overdosage: If you think you have taken too much of this medicine contact a poison control center or emergency room at once. NOTE: This medicine is only for you. Do not share this medicine with others. What if I miss a dose? Keep appointments for follow-up doses. It is important not to miss your dose. Call your care team if you are unable to keep an appointment. What may interact with this medication? Do not take this medication with any of the following: Deferoxamine Dimercaprol Other iron products This medication may also interact with the following: Chloramphenicol Deferasirox This list may not describe all possible interactions. Give your health care provider a list of all the medicines, herbs, non-prescription drugs, or dietary supplements you use. Also tell them if you smoke,  drink alcohol, or use illegal drugs. Some items may interact with your medicine. What should I watch for while using this medication? Visit your care team regularly. Tell your care team if your symptoms do not start to get better or if they get worse. You may need blood work done while you are taking this medication. You may need to follow a special diet. Talk to your care team. Foods that contain iron include: whole grains/cereals, dried fruits, beans, or peas, leafy green vegetables, and organ meats (liver, kidney). What side effects may I notice from receiving this medication? Side effects that you should report to your care team as soon as possible: Allergic reactions--skin rash, itching, hives, swelling of the face, lips, tongue, or throat Low blood pressure--dizziness, feeling faint or lightheaded, blurry vision Shortness of breath Side effects that usually do not require medical attention (report to your care team if they continue or are bothersome): Flushing Headache Joint pain Muscle pain Nausea Pain, redness, or irritation at injection site This list may not describe all possible side effects. Call your doctor for medical advice about side effects. You may report side effects to FDA at 1-800-FDA-1088. Where should I keep my medication? This medication is given in a hospital or clinic. It will not be stored at home. NOTE: This sheet is a summary. It may not cover all possible information. If you have questions about this medicine, talk to your doctor, pharmacist, or health care provider.  2024 Elsevier/Gold Standard (2023-05-01 00:00:00)

## 2023-08-20 ENCOUNTER — Other Ambulatory Visit
Admission: RE | Admit: 2023-08-20 | Discharge: 2023-08-20 | Disposition: A | Discharge status: Home / Self Care | Payer: Medicare Other | Source: Ambulatory Visit | Attending: Family | Admitting: Family

## 2023-08-20 ENCOUNTER — Ambulatory Visit (HOSPITAL_BASED_OUTPATIENT_CLINIC_OR_DEPARTMENT_OTHER): Payer: Medicare Other | Admitting: Family

## 2023-08-20 ENCOUNTER — Encounter: Payer: Self-pay | Admitting: Family

## 2023-08-20 ENCOUNTER — Other Ambulatory Visit: Payer: Self-pay

## 2023-08-20 ENCOUNTER — Other Ambulatory Visit: Payer: Self-pay | Admitting: Family

## 2023-08-20 VITALS — BP 108/65 | HR 56 | Wt 145.0 lb

## 2023-08-20 DIAGNOSIS — I340 Nonrheumatic mitral (valve) insufficiency: Secondary | ICD-10-CM | POA: Diagnosis not present

## 2023-08-20 DIAGNOSIS — I4729 Other ventricular tachycardia: Secondary | ICD-10-CM

## 2023-08-20 DIAGNOSIS — I2510 Atherosclerotic heart disease of native coronary artery without angina pectoris: Secondary | ICD-10-CM | POA: Diagnosis not present

## 2023-08-20 DIAGNOSIS — I5022 Chronic systolic (congestive) heart failure: Secondary | ICD-10-CM

## 2023-08-20 DIAGNOSIS — I10 Essential (primary) hypertension: Secondary | ICD-10-CM

## 2023-08-20 DIAGNOSIS — D508 Other iron deficiency anemias: Secondary | ICD-10-CM | POA: Diagnosis not present

## 2023-08-20 DIAGNOSIS — I5042 Chronic combined systolic (congestive) and diastolic (congestive) heart failure: Secondary | ICD-10-CM | POA: Diagnosis not present

## 2023-08-20 DIAGNOSIS — I255 Ischemic cardiomyopathy: Secondary | ICD-10-CM | POA: Diagnosis present

## 2023-08-20 DIAGNOSIS — R188 Other ascites: Secondary | ICD-10-CM | POA: Diagnosis not present

## 2023-08-20 DIAGNOSIS — K7460 Unspecified cirrhosis of liver: Secondary | ICD-10-CM

## 2023-08-20 LAB — BASIC METABOLIC PANEL
Anion gap: 9 (ref 5–15)
BUN: 43 mg/dL — ABNORMAL HIGH (ref 6–20)
CO2: 19 mmol/L — ABNORMAL LOW (ref 22–32)
Calcium: 8.7 mg/dL — ABNORMAL LOW (ref 8.9–10.3)
Chloride: 108 mmol/L (ref 98–111)
Creatinine, Ser: 1.65 mg/dL — ABNORMAL HIGH (ref 0.44–1.00)
GFR, Estimated: 35 mL/min — ABNORMAL LOW (ref >60)
Glucose, Bld: 95 mg/dL (ref 70–99)
Potassium: 4.1 mmol/L (ref 3.5–5.1)
Sodium: 136 mmol/L (ref 135–145)

## 2023-08-20 LAB — BRAIN NATRIURETIC PEPTIDE: B Natriuretic Peptide: 1439.7 pg/mL — ABNORMAL HIGH (ref 0.0–100.0)

## 2023-08-20 MED ORDER — TORSEMIDE 20 MG PO TABS: 60.0000 mg | ORAL_TABLET | Freq: Two times a day (BID) | ORAL | 5 refills | Status: DC

## 2023-08-20 NOTE — Progress Notes (Signed)
PCP: Doreene Nest, NP (last seen 10/23) Primary Cardiologist: Jodelle Red, MD (last seen 10/23) HF cardiologist: Marca Ancona, MD (last seen 05/24)  HPI:  Anita [name] is a 62 y/o female with a history of CAD (occluded RCA with collaterals), SVT, COPD, GERD, multiple sclerosis, pulmonary HTN, cirrhosis likely secondary to RV failure, severe valvular disease, previous tobacco use, congestive hepatopathy and chronic heart failure.   Admitted 09/16/22 due to SVT. Cardiology consult obtained. Given IV adenosine and then amiodarone drip. Lasix gtt. Plan for EP consult. Discharged after 3 days.   Echo 03/20/20: EF 40-45% with moderately elevated PA pressure, mild LAE, moderate/ severe MR Echo 02/07/21: EF of 40-45% along with severely elevated PA pressure of 69.2 mmHg, moderate LAE, severe MR, mild/ moderate Anita and moderate/severe TR.  Echo 08/22/22: EF of 30-35% along with mild LVH and severe MR/TR.  TEE 03/18/23: EF 40% with moderate LAE/ severe RAE and severe MR with restricted posterior leaflet, peak RV-RA gradient 48 mmHg, severe TR. No PFO or ASD.   LHC done 03/23/20 showed: Hemodynamics: LV end diastolic pressure is moderately elevated. ----Coronary Angiography----- Ost RCA to Dist RCA lesion is 100% stenosed.-The PDA and PL system fills via faint collaterals from the AV groove LCx and LAD septals. Mid LAD-1 lesion is 60% stenosed. Mid LAD-2 lesion is 50% stenosed. Dist LAD lesion is 55% stenosed with 70% stenosed side branch in 2nd Diag. 1st Diag lesion is 70% stenosed. Ost Cx to Prox Cx lesion is 45% stenosed. Prox-MID Cx lesion is 65% stenosed.  MODERATE-SEVERE THREE-VESSEL CAD: 100% proximal RCA occlusion with left-to-right collaterals faintly filling PDA and PL system (AV groove LCx-PL and LAD septal-PDA) Diffuse moderate mid LAD disease with 60% to 50% stenosis. Tandem 50% and 65% proximal and mid LCx with the 65% lesion being the most significant lesion. Moderately  elevated LVEDP of 18-20 mmHg  She presents today for a HF follow-up visit with a chief complaint of moderate SOB with minimal exertion. Chronic in nature and stable in severity. Has associated fatigue, cough (although improving), intermittent dizziness, worsening abdominal distention and weight gain along with this. Denies chest pain, palpitations, pedal edema or difficulty sleeping.   She has cirrhosis likely secondary to RV failure. She had paracenteses in 3/24, 4/24, and in 5/24, most recently with removal of 6.2 L.  ROS: All systems negative except as listed in HPI, PMH and Problem List.  SH:  Social History   Socioeconomic History   Marital status: Divorced    Spouse name: Not on file   Number of children: 2   Years of education: Not on file   Highest education level: Not on file  Occupational History   Not on file  Tobacco Use   Smoking status: Former    Current packs/day: 1.00    Types: Cigarettes   Smokeless tobacco: Never  Vaping Use   Vaping status: Never Used  Substance and Sexual Activity   Alcohol use: Not Currently    Comment: Occasional   Drug use: Never   Sexual activity: Not on file  Other Topics Concern   Not on file  Social History Narrative   Right handed    Lives in one story home   Drinks Caffeine - Sweet Tea    Social Determinants of Health   Financial Resource Strain: Low Risk  (07/08/2022)   Overall Financial Resource Strain (CARDIA)    Difficulty of Paying Living Expenses: Not hard at all  Food Insecurity: No Food Insecurity (09/17/2022)  Hunger Vital Sign    Worried About Running Out of Food in the Last Year: Never true    Ran Out of Food in the Last Year: Never true  Transportation Needs: No Transportation Needs (09/17/2022)   PRAPARE - Administrator, Civil Service (Medical): No    Lack of Transportation (Non-Medical): No  Physical Activity: Inactive (07/08/2022)   Exercise Vital Sign    Days of Exercise per Week: 0 days     Minutes of Exercise per Session: 0 min  Stress: No Stress Concern Present (07/08/2022)   [employer] of Occupational Health - Occupational Stress Questionnaire    Feeling of Stress : Not at all  Social Connections: Socially Isolated (07/08/2022)   Social Connection and Isolation Panel [NHANES]    Frequency of Communication with Friends and Family: Three times a week    Frequency of Social Gatherings with Friends and Family: Never    Attends Religious Services: Never    Database administrator or Organizations: No    Attends Banker Meetings: Never    Marital Status: Divorced  Catering manager Violence: Not At Risk (09/17/2022)   Humiliation, Afraid, Rape, and Kick questionnaire    Fear of Current or Ex-Partner: No    Emotionally Abused: No    Physically Abused: No    Sexually Abused: No    FH:  Family History  Problem Relation Age of Onset   Skin cancer Mother    Lung cancer Father    Uterine cancer Sister    Heart disease Brother    Bipolar disorder Sister    Throat cancer Paternal Grandmother     Past Medical History:  Diagnosis Date   Abnormality of gait 07/26/2013   Acute respiratory failure with hypoxia (HCC) 03/20/2020   Allergy    Arthritis    Atypical pneumonia 03/22/2020   CAD (coronary artery disease)    a. 03/2020 Cath: LM nl, LAD 60/36m, 55d, D1 70, D2 70, LCX 45ost/p, 65p, RCA 100ost CTO. RPDA fills via collats from dLAD. Inf septal fills via collats from 1st septal, RPAV fills via collats from LPAV, RPL1/2 sev dzs-->med rx.   Chickenpox    Chronic combined systolic (congestive) and diastolic (congestive) heart failure (HCC)    a. 02/2021 Echo: EF 40-45%, glob HK, gr2 DD. Sev red RV fxn, RVSP 69.76mmHg. Mod dil LA, sev dil RA. Sev MR. Mild to mod Anita. Mean MV grad 6.38mmHg. Mod-Sev TR. Mild AI. Mild Ao sclerosis w/o stenosis.   COPD (chronic obstructive pulmonary disease) (HCC)    Elevated troponin 03/22/2020   GERD (gastroesophageal reflux disease)     Headache(784.0)    Migraine   Hearing loss    Right ear secondary to infection   History of shingles    Ischemic cardiomyopathy    a. 02/2021 Echo: EF 40-45%, glob HK.   Migraines    Multiple sclerosis (HCC)    Obese    Optic neuritis    PAH (pulmonary artery hypertension) (HCC)    a. 02/2021 Echo: RVSP 69.21mmHg.   Prolonged QT interval 03/20/2020   Severe mitral regurgitation    a. 02/2021 Echo: Severe MR w/ mild to mod Anita.  Mean MV grad 6.74mmHg.    Current Outpatient Medications  Medication Sig Dispense Refill   albuterol (VENTOLIN HFA) 108 (90 Base) MCG/ACT inhaler INHALE 1-2 PUFFS INTO THE LUNGS EVERY 6 (SIX) HOURS AS NEEDED FOR WHEEZING OR SHORTNESS OF BREATH. USE SPARINGLY! 18 each 0  amiodarone (PACERONE) 200 MG tablet Take 0.5 tablets (100 mg total) by mouth daily. 180 tablet 3   aspirin EC 81 MG tablet Take 1 tablet (81 mg total) by mouth daily. Swallow whole. 30 tablet 0   atorvastatin (LIPITOR) 20 MG tablet TAKE 1 TABLET BY MOUTH EVERYDAY AT BEDTIME 90 tablet 1   dapagliflozin propanediol (FARXIGA) 10 MG TABS tablet Take 1 tablet (10 mg total) by mouth daily before breakfast. 30 tablet 11   DULoxetine (CYMBALTA) 20 MG capsule TAKE 1 CAPSULE (20 MG TOTAL) BY MOUTH DAILY. FOR ANXIETY AND PAIN. 90 capsule 2   feeding supplement, ENSURE ENLIVE, (ENSURE ENLIVE) LIQD Take 237 mLs by mouth 2 (two) times daily between meals. (Patient taking differently: Take 237 mLs by mouth 2 (two) times daily between meals.)     ferrous sulfate 325 (65 FE) MG EC tablet Take 1 tablet (325 mg total) by mouth daily. (Patient not taking: Reported on 07/31/2023) 60 tablet 3   fluticasone-salmeterol (ADVAIR DISKUS) 250-50 MCG/ACT AEPB INHALE 1 PUFF INTO THE LUNGS TWICE A DAY 60 each 5   KLOR-CON M20 20 MEQ tablet TAKE 2 TABLETS BY MOUTH 2 TIMES DAILY. 360 tablet 1   levothyroxine (SYNTHROID) 25 MCG tablet Take 1 tablet (25 mcg total) by mouth daily before breakfast. 30 tablet 3   metoprolol succinate  (TOPROL-XL) 25 MG 24 hr tablet TAKE 1 TABLET (25 MG TOTAL) BY MOUTH DAILY. 90 tablet 3   nitroGLYCERIN (NITROSTAT) 0.4 MG SL tablet Place 1 tablet (0.4 mg total) under the tongue every 5 (five) minutes as needed for chest pain. (Patient not taking: Reported on 06/12/2023) 20 tablet 5   omeprazole (PRILOSEC) 40 MG capsule TAKE 1 CAPSULE (40 MG TOTAL) BY MOUTH DAILY. FOR HEARTBURN. 90 capsule 0   Pediatric Multiple Vitamins (FLINTSTONES MULTIVITAMIN) CHEW Chew by mouth.     Probiotic Product (PROBIOTIC ADVANCED) CAPS Take 1 capsule by mouth daily.     spironolactone (ALDACTONE) 25 MG tablet Take 1 tablet (25 mg total) by mouth 2 (two) times daily. 180 tablet 3   torsemide (DEMADEX) 20 MG tablet Take 3 tablets (60 mg total) by mouth 2 (two) times daily. 120 tablet 3   trolamine salicylate (ASPERCREME) 10 % cream Apply 1 application  topically daily as needed for muscle pain (back or joint pain).     vitamin B-12 (CYANOCOBALAMIN) 500 MCG tablet Take 500 mcg by mouth 4 (four) times a week. 4 times weekly no more than 2000 MCG per week     Current Facility-Administered Medications  Medication Dose Route Frequency Provider Last Rate Last Admin   albumin human 25 % solution 25 g  25 g Intravenous Once Wyline Mood, MD       Vitals:   08/20/23 1041  BP: 108/65  Pulse: (!) 56  SpO2: 100%  Weight: 145 lb (65.8 kg)   Wt Readings from Last 3 Encounters:  08/20/23 145 lb (65.8 kg)  07/31/23 136 lb 8 oz (61.9 kg)  06/12/23 128 lb 12.8 oz (58.4 kg)   Lab Results  Component Value Date   CREATININE 1.65 (H) 08/20/2023   CREATININE 1.62 (H) 05/22/2023   CREATININE 1.88 (H) 05/08/2023   PHYSICAL EXAM:  General:  Well appearing. No resp difficulty HEENT: normal Neck: supple.JVP normal. No lymphadenopathy or thryomegaly appreciated. Cor: PMI normal. Regular rhythm, bradycardic. No rubs, gallops, 2/6 HSM at apex Lungs: clear Abdomen: soft, nontender, moderate distention No hepatosplenomegaly. No bruits  or masses.  Extremities: no cyanosis, clubbing,  rash, edema Neuro: alert & oriented x3, cranial nerves grossly intact. Moves all 4 extremities w/o difficulty. Affect pleasant.   ECG: not done   ASSESSMENT & PLAN:  1: Chronic heart failure with reduced ejection fraction- - suspect mixed ischemic and valvular disease - NYHA class III - euvolemic today - weighing daily; reminded to call for an overnight weight gain of > 2 pounds or a weekly weight gain of > 5 pounds - weight up 16.2 pounds since her last visit here 2 months ago - Echo 03/20/20: EF 40-45% with moderately elevated PA pressure, mild LAE, moderate/ severe MR - Echo 02/07/21: EF of 40-45% along with severely elevated PA pressure of 69.2 mmHg, moderate LAE, severe MR, mild/ moderate Anita and moderate/severe TR.  - Echo 08/22/22: EF of 30-35% along with mild LVH and severe MR/TR.  - TEE 03/18/23: EF 40% with moderate LAE/ severe RAE and severe MR with restricted posterior leaflet, peak RV-RA gradient 48 mmHg, severe TR. No PFO or ASD.  - not adding salt and daughter says that she doesn't cook with salt either - saw [facility] provider Shirlee Latch) 05/24 - continue metoprolol succinate 25mg  daily - continue farxiga 10mg  daily - continue spironolactone 25mg  BID - continue torsemide 60mg  BID - had been hypotensive with entresto in the past - BMP/ BNP today - palliative care visit done 09/10/22 - BNP 04/17/23 was 2260.8  2: HTN- - BP 108/65  - saw PCP Anita [name]) 10/23 - BMP 05/22/23 showed sodium 137, potassium 4.2, creatinine 1.62 & GFR 36  - BMP today  3: CAD- - saw cardiology Anita [name]) 10/23 - continue atorvastatin 20mg  daily - continue ASA 81mg  daily - LHC done 03/23/20 showed: Hemodynamics: LV end diastolic pressure is moderately elevated. ----Coronary Angiography----- Ost RCA to Dist RCA lesion is 100% stenosed.-The PDA and PL system fills via faint collaterals from the AV groove LCx and LAD septals. Mid LAD-1 lesion is 60% stenosed. Mid  LAD-2 lesion is 50% stenosed. Dist LAD lesion is 55% stenosed with 70% stenosed side branch in 2nd Diag. 1st Diag lesion is 70% stenosed. Ost Cx to Prox Cx lesion is 45% stenosed. Prox-MID Cx lesion is 65% stenosed.  MODERATE-SEVERE THREE-VESSEL CAD: 100% proximal RCA occlusion with left-to-right collaterals faintly filling PDA and PL system (AV groove LCx-PL and LAD septal-PDA) Diffuse moderate mid LAD disease with 60% to 50% stenosis. Tandem 50% and 65% proximal and mid LCx with the 65% lesion being the most significant lesion. Moderately elevated LVEDP of 18-20 mmHg  4: NSVT- - continue amiodarone 100mg  daily - required adenosine to terminate 10/23 - needs regular eye exams - TSH 05/22/23 was 14.093 - LDL 01/01/23 was 56  5: Cirrhosis w/ ascites- - suspect cardiogenic cirrhosis due to RV failure - periodic paracentesis; last one done 05/24 with removal of 6.2L; If the patient eventually requires >/=2 paracenteses in a 30 day period, candidacy for formal evaluation by the [facility] will be assessed - will schedule for paracentesis with 25 gm albumin X1 - saw GI Anita [name]) 01/24  6: Valvular heart disease- - TEE 03/18/23: EF 40% with moderate LAE/ severe RAE and severe MR. No PFO or ASD - does not qualifiy for mTEER due to cirrhosis, Anita and fraility. Dr Shirlee Latch discussed with structural heart team and even if it could be done, this would not fix her TR which is also a large contributing factor - eventual hospice referral has previously been discussed  7: Anemia- - saw hematology Anita [name])  08/24 - received iron infusions twice in August - Hg 07/28/23 was 10.5  Return in 1 month, sooner if needed

## 2023-08-20 NOTE — Patient Instructions (Addendum)
Go over to the [facility]. Go pass the gift shop and have your blood work completed.  Someone will call you to schedule your paracentesis. The number is also [phone] if you would like to reach out to them. Your order has already been placed.

## 2023-08-23 ENCOUNTER — Other Ambulatory Visit (HOSPITAL_COMMUNITY): Payer: Self-pay | Admitting: Cardiology

## 2023-08-27 ENCOUNTER — Other Ambulatory Visit: Payer: Self-pay

## 2023-08-27 ENCOUNTER — Ambulatory Visit
Admission: RE | Admit: 2023-08-27 | Discharge: 2023-08-27 | Disposition: A | Discharge status: Home / Self Care | Payer: Medicare Other | Source: Ambulatory Visit | Attending: Family | Admitting: Family

## 2023-08-27 DIAGNOSIS — I5022 Chronic systolic (congestive) heart failure: Secondary | ICD-10-CM

## 2023-08-27 DIAGNOSIS — R188 Other ascites: Secondary | ICD-10-CM

## 2023-08-27 DIAGNOSIS — I10 Essential (primary) hypertension: Secondary | ICD-10-CM | POA: Diagnosis not present

## 2023-08-27 DIAGNOSIS — K7460 Unspecified cirrhosis of liver: Secondary | ICD-10-CM

## 2023-08-27 MED ORDER — LIDOCAINE HCL (PF) 1 % IJ SOLN
10.0000 mL | Freq: Once | INTRAMUSCULAR | Status: AC
  Administered 2023-08-27: 10 mL via INTRADERMAL
  Filled 2023-08-27: qty 10

## 2023-08-27 NOTE — Procedures (Signed)
PROCEDURE SUMMARY:  Successful image-guided paracentesis from the right lower abdomen.  Yielded 5.3 liters of clear, light yellow fluid.  No immediate complications.  EBL = trace. Patient tolerated well.  Patient is declining albumin today.  Specimen was not sent for labs.  Please see imaging section of Epic for full dictation.   Kennieth Francois PA-C 08/27/2023 8:55 AM

## 2023-09-09 ENCOUNTER — Other Ambulatory Visit (HOSPITAL_BASED_OUTPATIENT_CLINIC_OR_DEPARTMENT_OTHER): Payer: Self-pay | Admitting: Cardiology

## 2023-09-09 ENCOUNTER — Other Ambulatory Visit: Payer: Self-pay | Admitting: Primary Care

## 2023-09-09 DIAGNOSIS — M150 Primary generalized (osteo)arthritis: Secondary | ICD-10-CM

## 2023-09-11 NOTE — Progress Notes (Unsigned)
NEUROLOGY FOLLOW UP OFFICE NOTE  [name] [id number]  Assessment/Plan:   1  Multiple sclerosis - inactive secondary progressive 2  Migraine without aura, without status migrainosus, not intractable 3  Insomnia   As it will not change management (for we will not be starting DMT), we can hold off on the MRI *** Consider melatonin at bedtime and avoid naps to help  *** Follow up in 12 months.     Subjective:  Anita [name] is a 62 year old right-handed female with CAD, prolonged QT, severe mitral regurg, CHF and arthritis who follows up for multiple sclerosis.  She is accompanied by her daughter who provides further history.  UPDATE:   Vision:  Vision is blurred.  has not had an eye exam since last visit.   Motor:  generalized weakness Sensory:  numbness and tingling in the right hand.  However may feel tingling and numbness in various extremities Pain:  Left lateral burning thigh pain Gait:  Requires walker.  Outside home, she uses wheelchair Bowel/Bladder:  Partial bowel and bladder incontinence Fatigue:  yes Cognition:  Mild cognitive impairment Mood:  okay Insomnia.  Naps off and on throughout the day.  Tried Benadryl which doesn't help.  Melatonin was ineffective.  Cannot take much due to cardiac comorbidities. Appetite is improved.  5 small meals a day.   Migraines:  Infrequent. Stopped sumatriptan due to CAD.  HISTORY:  Diagnosed with multiple sclerosis in May 2000 presenting as optic neuritis (right and then left).  For several years before then, she noted occasional paresthesias.  For several years (early 2001 to 2010) she declined being on DMT.  During that period, she clinically did well.  Occasionally she had balance problems and may have had another episode of optic neuritis.  She was on Tysabri in 2010 until about 2012.  Subsequently stopped due to positive JC Virus positive.  She reportedly had significant decline in 2014 with worsening gait in  which she resorted to using a walker, mild cognitive impairment and occasional fecal incontinence.  She thought it was just another flare up but symptoms never resolved.     She has had a decline since 2022.  She has multiple co-morbidities such as CHF and CAD.  She was in the hospital on at least 2 occasions over the past year with prolong rehab in which she was pretty much sedentary.       She has significant medical co-morbidities including COPD, rheumatic mitral regurgitation, CAD, orthostatic hypotension, combined systolic and diastolic heart failure, and iron-deficiency anemia   Current DMT:  None Past DMT:  Avonex (side effects), Copaxone, Tysabri (JC antibody positive)   Current medications:  midodrine, torsemide, sumatriptan 25mg , B12, ferrous sulfate, MVI Past medications:  H.P. Acthar Gel (throat swelling, hemoptysis), Solu-Medrol, mirtazapine    Imaging: Unable to get MRIs now because she had trouble breathing when flat on the table.    10/27/2003 MRI BRAIN W WO (personally reviewed):  increasing size and number of multiple intracranial lesions involving the periventricular subcortical white matter, consistent with progression of MS since previous study of April, 2002. 03/26/2001 MRI BRAIN W WO:  Small white matter lesions on the right are unchanged (compared to MRI from 04/05/99).  These lesions are suggestive but not diagnostic of multiple sclerosis.  Other causes of small vessel ischemic disease could also give this appearance, including diabetes, hypertension, vasculitis, or migraine headaches. 04/05/1999 MRI BRAIN W WO:  There are multiple small nonspecific foci of  increased T2 prolongation involving the cerebral white matter.  In this young patient with reported clinically suspected optic neuritis multiple sclerosis is a consideration.  Further considerations include causes for small vessel disease including diabetes, hypertension, vasculitis, collagen vascular disease, and  migraines.   No know family history of MS.    PAST MEDICAL HISTORY: Past Medical History:  Diagnosis Date   Abnormality of gait 07/26/2013   Acute respiratory failure with hypoxia (HCC) 03/20/2020   Allergy    Arthritis    Atypical pneumonia 03/22/2020   CAD (coronary artery disease)    a. 03/2020 Cath: LM nl, LAD 60/80m, 55d, D1 70, D2 70, LCX 45ost/p, 65p, RCA 100ost CTO. RPDA fills via collats from dLAD. Inf septal fills via collats from 1st septal, RPAV fills via collats from LPAV, RPL1/2 sev dzs-->med rx.   Chickenpox    Chronic combined systolic (congestive) and diastolic (congestive) heart failure (HCC)    a. 02/2021 Echo: EF 40-45%, glob HK, gr2 DD. Sev red RV fxn, RVSP 69.25mmHg. Mod dil LA, sev dil RA. Sev MR. Mild to mod MS. Mean MV grad 6.64mmHg. Mod-Sev TR. Mild AI. Mild Ao sclerosis w/o stenosis.   COPD (chronic obstructive pulmonary disease) (HCC)    Elevated troponin 03/22/2020   GERD (gastroesophageal reflux disease)    Headache(784.0)    Migraine   Hearing loss    Right ear secondary to infection   History of shingles    Ischemic cardiomyopathy    a. 02/2021 Echo: EF 40-45%, glob HK.   Migraines    Multiple sclerosis (HCC)    Obese    Optic neuritis    PAH (pulmonary artery hypertension) (HCC)    a. 02/2021 Echo: RVSP 69.90mmHg.   Prolonged QT interval 03/20/2020   Severe mitral regurgitation    a. 02/2021 Echo: Severe MR w/ mild to mod MS.  Mean MV grad 6.59mmHg.    MEDICATIONS: Current Outpatient Medications on File Prior to Visit  Medication Sig Dispense Refill   albuterol (VENTOLIN HFA) 108 (90 Base) MCG/ACT inhaler INHALE 1-2 PUFFS INTO THE LUNGS EVERY 6 (SIX) HOURS AS NEEDED FOR WHEEZING OR SHORTNESS OF BREATH. USE SPARINGLY! 18 each 0   amiodarone (PACERONE) 200 MG tablet Take 0.5 tablets (100 mg total) by mouth daily. 180 tablet 3   aspirin EC 81 MG tablet Take 1 tablet (81 mg total) by mouth daily. Swallow whole. 30 tablet 0   atorvastatin (LIPITOR) 20 MG  tablet Take 1 tablet by mouth everyday at bedtime.  Please call [phone] to schedule an appointment with Dr. Cristal Deer for future refills. Thank you. 30 tablet 0   dapagliflozin propanediol (FARXIGA) 10 MG TABS tablet Take 1 tablet (10 mg total) by mouth daily before breakfast. 30 tablet 11   DULoxetine (CYMBALTA) 20 MG capsule TAKE 1 CAPSULE (20 MG TOTAL) BY MOUTH DAILY. FOR ANXIETY AND PAIN. 90 capsule 0   feeding supplement, ENSURE ENLIVE, (ENSURE ENLIVE) LIQD Take 237 mLs by mouth 2 (two) times daily between meals. (Patient taking differently: Take 237 mLs by mouth 2 (two) times daily between meals.)     ferrous sulfate 325 (65 FE) MG EC tablet Take 1 tablet (325 mg total) by mouth daily. (Patient not taking: Reported on 07/31/2023) 60 tablet 3   fluticasone-salmeterol (ADVAIR DISKUS) 250-50 MCG/ACT AEPB INHALE 1 PUFF INTO THE LUNGS TWICE A DAY 60 each 5   KLOR-CON M20 20 MEQ tablet TAKE 2 TABLETS BY MOUTH 2 TIMES DAILY. 360 tablet 1   levothyroxine (  SYNTHROID) 25 MCG tablet TAKE 1 TABLET BY MOUTH DAILY BEFORE BREAKFAST. 90 tablet 1   metoprolol succinate (TOPROL-XL) 25 MG 24 hr tablet TAKE 1 TABLET (25 MG TOTAL) BY MOUTH DAILY. 90 tablet 3   nitroGLYCERIN (NITROSTAT) 0.4 MG SL tablet Place 1 tablet (0.4 mg total) under the tongue every 5 (five) minutes as needed for chest pain. (Patient not taking: Reported on 06/12/2023) 20 tablet 5   omeprazole (PRILOSEC) 40 MG capsule TAKE 1 CAPSULE (40 MG TOTAL) BY MOUTH DAILY. FOR HEARTBURN. 90 capsule 0   Pediatric Multiple Vitamins (FLINTSTONES MULTIVITAMIN) CHEW Chew by mouth.     Probiotic Product (PROBIOTIC ADVANCED) CAPS Take 1 capsule by mouth daily.     spironolactone (ALDACTONE) 25 MG tablet Take 1 tablet (25 mg total) by mouth 2 (two) times daily. 180 tablet 3   torsemide (DEMADEX) 20 MG tablet Take 3 tablets (60 mg total) by mouth 2 (two) times daily. 180 tablet 5   trolamine salicylate (ASPERCREME) 10 % cream Apply 1 application  topically  daily as needed for muscle pain (back or joint pain).     vitamin B-12 (CYANOCOBALAMIN) 500 MCG tablet Take 500 mcg by mouth 4 (four) times a week. 4 times weekly no more than 2000 MCG per week     Current Facility-Administered Medications on File Prior to Visit  Medication Dose Route Frequency Provider Last Rate Last Admin   albumin human 25 % solution 25 g  25 g Intravenous Once Wyline Mood, MD        ALLERGIES: Allergies  Allergen Reactions   Acthar Hp [Corticotropin] Other (See Comments)    Throat swelling and coughing up blood   Codeine    Prednisone     FAMILY HISTORY: Family History  Problem Relation Age of Onset   Skin cancer Mother    Lung cancer Father    Uterine cancer Sister    Heart disease Brother    Bipolar disorder Sister    Throat cancer Paternal Grandmother       Objective:  *** General: No acute distress.  Patient appears well-groomed.   Head:  Normocephalic/atraumatic Eyes:  Fundi examined but not visualized Neck: supple, no paraspinal tenderness, full range of motion Heart:  Regular rate and rhythm Neurological Exam: alert and oriented to person, place, and time.  Speech fluent and not dysarthric, language intact.  CN II-XII intact. Bulk decreased and tone increased, muscle strength 5-/5 throughout.  Sensation to light touch intact.  Deep tendon reflexes 2+ throughout, toes downgoing.  Finger to nose testing dysmetria bilateraly.  In wheelchair.  Gait testing deferred. ***   Shon Millet, DO  CC: Vernona Rieger, NP

## 2023-09-14 ENCOUNTER — Encounter: Payer: Self-pay | Admitting: Neurology

## 2023-09-14 ENCOUNTER — Ambulatory Visit (INDEPENDENT_AMBULATORY_CARE_PROVIDER_SITE_OTHER): Payer: Medicare Other | Admitting: Neurology

## 2023-09-14 VITALS — BP 107/59 | HR 55 | Ht 63.0 in | Wt 140.0 lb

## 2023-09-14 DIAGNOSIS — G43009 Migraine without aura, not intractable, without status migrainosus: Secondary | ICD-10-CM | POA: Diagnosis not present

## 2023-09-14 DIAGNOSIS — G35 Multiple sclerosis: Secondary | ICD-10-CM | POA: Diagnosis not present

## 2023-09-14 NOTE — Patient Instructions (Signed)
Try to limit naps to before 2 PM

## 2023-09-16 ENCOUNTER — Ambulatory Visit: Payer: Medicare Other | Admitting: Primary Care

## 2023-09-16 ENCOUNTER — Encounter: Payer: Self-pay | Admitting: Primary Care

## 2023-09-16 VITALS — BP 128/78 | HR 56 | Temp 97.0°F | Ht 63.0 in | Wt 141.0 lb

## 2023-09-16 DIAGNOSIS — E039 Hypothyroidism, unspecified: Secondary | ICD-10-CM

## 2023-09-16 DIAGNOSIS — D508 Other iron deficiency anemias: Secondary | ICD-10-CM

## 2023-09-16 DIAGNOSIS — R188 Other ascites: Secondary | ICD-10-CM

## 2023-09-16 DIAGNOSIS — J432 Centrilobular emphysema: Secondary | ICD-10-CM

## 2023-09-16 DIAGNOSIS — K219 Gastro-esophageal reflux disease without esophagitis: Secondary | ICD-10-CM | POA: Diagnosis not present

## 2023-09-16 DIAGNOSIS — E2839 Other primary ovarian failure: Secondary | ICD-10-CM | POA: Diagnosis not present

## 2023-09-16 DIAGNOSIS — G35 Multiple sclerosis: Secondary | ICD-10-CM | POA: Diagnosis not present

## 2023-09-16 DIAGNOSIS — I340 Nonrheumatic mitral (valve) insufficiency: Secondary | ICD-10-CM | POA: Diagnosis not present

## 2023-09-16 DIAGNOSIS — M150 Primary generalized (osteo)arthritis: Secondary | ICD-10-CM | POA: Diagnosis not present

## 2023-09-16 DIAGNOSIS — H6121 Impacted cerumen, right ear: Secondary | ICD-10-CM | POA: Diagnosis not present

## 2023-09-16 DIAGNOSIS — K7460 Unspecified cirrhosis of liver: Secondary | ICD-10-CM | POA: Diagnosis not present

## 2023-09-16 DIAGNOSIS — I5020 Unspecified systolic (congestive) heart failure: Secondary | ICD-10-CM | POA: Diagnosis not present

## 2023-09-16 DIAGNOSIS — D631 Anemia in chronic kidney disease: Secondary | ICD-10-CM

## 2023-09-16 DIAGNOSIS — Z1231 Encounter for screening mammogram for malignant neoplasm of breast: Secondary | ICD-10-CM

## 2023-09-16 DIAGNOSIS — I10 Essential (primary) hypertension: Secondary | ICD-10-CM | POA: Diagnosis not present

## 2023-09-16 DIAGNOSIS — I2510 Atherosclerotic heart disease of native coronary artery without angina pectoris: Secondary | ICD-10-CM | POA: Diagnosis not present

## 2023-09-16 DIAGNOSIS — N1831 Chronic kidney disease, stage 3a: Secondary | ICD-10-CM

## 2023-09-16 DIAGNOSIS — F5101 Primary insomnia: Secondary | ICD-10-CM

## 2023-09-16 DIAGNOSIS — G35D Multiple sclerosis, unspecified: Secondary | ICD-10-CM

## 2023-09-16 DIAGNOSIS — G43701 Chronic migraine without aura, not intractable, with status migrainosus: Secondary | ICD-10-CM

## 2023-09-16 DIAGNOSIS — H6120 Impacted cerumen, unspecified ear: Secondary | ICD-10-CM | POA: Insufficient documentation

## 2023-09-16 LAB — T4, FREE: Free T4: 0.91 ng/dL (ref 0.60–1.60)

## 2023-09-16 LAB — TSH: TSH: 10.66 u[IU]/mL — ABNORMAL HIGH (ref 0.35–5.50)

## 2023-09-16 NOTE — Assessment & Plan Note (Signed)
Following with hematology. Office notes reviewed from August 2024

## 2023-09-16 NOTE — Assessment & Plan Note (Signed)
Following with hematology. Reviewed office notes from August 2024.  Continue iron infusions as needed.  Resume oral iron when iron infusions complete.

## 2023-09-16 NOTE — Assessment & Plan Note (Signed)
Right cerumen impaction identified on exam. Patient consented to irrigation of canals bilaterally.  Right canal irrigated. Patient tolerated well, but unable to completely remove the residual wax with irrigation.  Irrigation discontinued.  Discussed use of Debrox drops and home ear wax removal kits.

## 2023-09-16 NOTE — Assessment & Plan Note (Signed)
Following with cardiology and GI. Continue paracentesis as needed.

## 2023-09-16 NOTE — Assessment & Plan Note (Signed)
Follow with cardiology and CHF clinic, office notes reviewed from September 2024. Continue to monitor and treat conservatively as she is not a candidate for surgery.

## 2023-09-16 NOTE — Assessment & Plan Note (Signed)
No concerns today. Continue to monitor. 

## 2023-09-16 NOTE — Progress Notes (Signed)
Subjective:    Patient ID: Anita [name], female    DOB: [birth year], 62 y.o.   MRN: [id number]  HPI  Anita [name] is a very pleasant 62 y.o. female with a significant medical history including CHF, pulmonary hypertension, CAD, migraines, COPD, cirrhosis of liver, multiple sclerosis, CKD, anemia who presents today for follow-up of chronic conditions.  Her daughter joins [place] today.  1) CAD/Hypertension/CHF/SVT/Valvular Heart Disease: Following with cardiology and CHF clinic, last office visit was 08/20/2023 with heart failure clinic.  She is currently managed on metoprolol succinate 25 mg daily, Farxiga 10 mg daily, spironolactone 25 mg twice daily, torsemide 60 mg twice daily, atorvastatin 20 mg daily, amiodarone 100 mg daily, aspirin 81 mg daily.  Also with cirrhosis secondary to right ventricular failure.  History of paracentesis in March, April, May, September 2024.  During her most recent paracentesis 6.2 L were removed.  Since her last removal she's noticed her abdomen "filling up". She is not weighing herself.   BP Readings from Last 3 Encounters:  09/16/23 128/78  09/14/23 (!) 107/59  08/27/23 123/74     2) Multiple Sclerosis/Migraines: Following with neurology, last office visit was 2 days ago.  She is not currently on DMT, is managed on Cymbalta 20 mg daily for anxiety/pain/sleep.  No changes were made during her recent visit.  She feels well managed on Cymbalta. She is sleeping more soundly and appetite has improved.   3) CKD/Anemia: Following with hematology, last office visit was in August 2024.  Completed iron infusions x 2 last month. She is holding her iron pill while on infusions.   4) Hypothyroidism: Currently managed on levothyroxine 25 mcg daily which was initiated in June 2024 for ongoing elevated TSH over the last year.  Most recent TSH was 14.093 from June 2024 with free T4 of 0.59.  She is needing repeat thyroid levels today.  She is taking  levothyroxine every morning on an empty stomach with water only.   No food or other medications for 30 minutes.   No heartburn medication, iron pills, calcium, vitamin D, or magnesium pills within four hours of taking levothyroxine.   5) COPD: Currently managed on Advair Diskus 250-50 mcg for which she is not using currently. She is managed on albuterol inhaler for which she uses 1-2 times monthly.   Review of Systems  HENT:  Positive for hearing loss.   Respiratory:  Negative for shortness of breath.   Cardiovascular:  Negative for chest pain.  Gastrointestinal:  Negative for constipation and diarrhea.  Skin:  Negative for color change.  Neurological:  Negative for headaches.  Psychiatric/Behavioral:  Negative for sleep disturbance. The patient is not nervous/anxious.          Past Medical History:  Diagnosis Date   Abnormality of gait 07/26/2013   Acute kidney injury superimposed on stage IIIa chronic kidney disease (HCC) 03/20/2020   Acute on chronic combined systolic and diastolic CHF (congestive heart failure) (HCC) 10/08/2020   Acute on chronic HFrEF (heart failure with reduced ejection fraction) (HCC) 03/22/2020   Acute respiratory failure with hypoxia (HCC) 03/20/2020   Allergy    Arthritis    Atypical pneumonia 03/22/2020   CAD (coronary artery disease)    a. 03/2020 Cath: LM nl, LAD 60/50m, 55d, D1 70, D2 70, LCX 45ost/p, 65p, RCA 100ost CTO. RPDA fills via collats from dLAD. Inf septal fills via collats from 1st septal, RPAV fills via collats from LPAV, RPL1/2 sev dzs-->med rx.  Chickenpox    Chronic combined systolic (congestive) and diastolic (congestive) heart failure (HCC)    a. 02/2021 Echo: EF 40-45%, glob HK, gr2 DD. Sev red RV fxn, RVSP 69.80mmHg. Mod dil LA, sev dil RA. Sev MR. Mild to mod MS. Mean MV grad 6.75mmHg. Mod-Sev TR. Mild AI. Mild Ao sclerosis w/o stenosis.   COPD (chronic obstructive pulmonary disease) (HCC)    Elevated troponin 03/22/2020   GERD  (gastroesophageal reflux disease)    Headache(784.0)    Migraine   Hearing loss    Right ear secondary to infection   History of shingles    Ischemic cardiomyopathy    a. 02/2021 Echo: EF 40-45%, glob HK.   Migraines    Multiple sclerosis (HCC)    Obese    Optic neuritis    PAH (pulmonary artery hypertension) (HCC)    a. 02/2021 Echo: RVSP 69.57mmHg.   Prolonged QT interval 03/20/2020   Severe mitral regurgitation    a. 02/2021 Echo: Severe MR w/ mild to mod MS.  Mean MV grad 6.43mmHg.    Social History   Socioeconomic History   Marital status: Divorced    Spouse name: Not on file   Number of children: 2   Years of education: Not on file   Highest education level: Some college, no degree  Occupational History   Not on file  Tobacco Use   Smoking status: Former    Current packs/day: 1.00    Types: Cigarettes   Smokeless tobacco: Never  Vaping Use   Vaping status: Never Used  Substance and Sexual Activity   Alcohol use: Not Currently    Comment: Occasional   Drug use: Never   Sexual activity: Not on file  Other Topics Concern   Not on file  Social History Narrative   Right handed    Lives in one story home   Drinks Caffeine - Sweet Tea    Social Determinants of Health   Financial Resource Strain: Patient Declined (09/15/2023)   Overall Financial Resource Strain (CARDIA)    Difficulty of Paying Living Expenses: Patient declined  Food Insecurity: No Food Insecurity (09/15/2023)   Hunger Vital Sign    Worried About Running Out of Food in the Last Year: Never true    Ran Out of Food in the Last Year: Never true  Transportation Needs: No Transportation Needs (09/15/2023)   PRAPARE - Administrator, Civil Service (Medical): No    Lack of Transportation (Non-Medical): No  Physical Activity: Unknown (09/15/2023)   Exercise Vital Sign    Days of Exercise per Week: 0 days    Minutes of Exercise per Session: Not on file  Stress: No Stress Concern Present  (09/15/2023)   [employer] of Occupational Health - Occupational Stress Questionnaire    Feeling of Stress : Not at all  Social Connections: Socially Isolated (09/15/2023)   Social Connection and Isolation Panel [NHANES]    Frequency of Communication with Friends and Family: Three times a week    Frequency of Social Gatherings with Friends and Family: More than three times a week    Attends Religious Services: Never    Database administrator or Organizations: No    Attends Engineer, structural: Not on file    Marital Status: Divorced  Intimate Partner Violence: Not At Risk (09/17/2022)   Humiliation, Afraid, Rape, and Kick questionnaire    Fear of Current or Ex-Partner: No    Emotionally Abused: No  Physically Abused: No    Sexually Abused: No    Past Surgical History:  Procedure Laterality Date   COLONOSCOPY WITH PROPOFOL N/A 08/03/2020   Procedure: COLONOSCOPY WITH PROPOFOL;  Surgeon: Wyline Mood, MD;  Location: [facility] ENDOSCOPY;  Service: Gastroenterology;  Laterality: N/A;   Ganglioneuroma     Resection   LEFT HEART CATH AND CORONARY ANGIOGRAPHY N/A 03/23/2020   Procedure: LEFT HEART CATH AND CORONARY ANGIOGRAPHY;  Surgeon: Marykay Lex, MD;  Location: [facility] INVASIVE CV LAB;  Service: Cardiovascular;  Laterality: N/A;   PILONIDAL CYST EXCISION     PILONIDAL CYST EXCISION  1983   TEE WITHOUT CARDIOVERSION N/A 03/18/2023   Procedure: TRANSESOPHAGEAL ECHOCARDIOGRAM (TEE);  Surgeon: Laurey Morale, MD;  Location: [facility] ORS;  Service: Cardiovascular;  Laterality: N/A;   TUMOR REMOVAL  2007/2008    Family History  Problem Relation Age of Onset   Skin cancer Mother    Lung cancer Father    Uterine cancer Sister    Heart disease Brother    Bipolar disorder Sister    Throat cancer Paternal Grandmother     Allergies  Allergen Reactions   Acthar Hp [Corticotropin] Other (See Comments)    Throat swelling and coughing up blood   Codeine    Prednisone     Current  Outpatient Medications on File Prior to Visit  Medication Sig Dispense Refill   amiodarone (PACERONE) 200 MG tablet Take 0.5 tablets (100 mg total) by mouth daily. 180 tablet 3   aspirin EC 81 MG tablet Take 1 tablet (81 mg total) by mouth daily. Swallow whole. 30 tablet 0   atorvastatin (LIPITOR) 20 MG tablet Take 1 tablet by mouth everyday at bedtime.  Please call [phone] to schedule an appointment with Dr. Cristal Deer for future refills. Thank you. 30 tablet 0   dapagliflozin propanediol (FARXIGA) 10 MG TABS tablet Take 1 tablet (10 mg total) by mouth daily before breakfast. 30 tablet 11   DULoxetine (CYMBALTA) 20 MG capsule TAKE 1 CAPSULE (20 MG TOTAL) BY MOUTH DAILY. FOR ANXIETY AND PAIN. 90 capsule 0   fluticasone-salmeterol (ADVAIR DISKUS) 250-50 MCG/ACT AEPB INHALE 1 PUFF INTO THE LUNGS TWICE A DAY 60 each 5   KLOR-CON M20 20 MEQ tablet TAKE 2 TABLETS BY MOUTH 2 TIMES DAILY. 360 tablet 1   levothyroxine (SYNTHROID) 25 MCG tablet TAKE 1 TABLET BY MOUTH DAILY BEFORE BREAKFAST. 90 tablet 1   metoprolol succinate (TOPROL-XL) 25 MG 24 hr tablet TAKE 1 TABLET (25 MG TOTAL) BY MOUTH DAILY. 90 tablet 3   nitroGLYCERIN (NITROSTAT) 0.4 MG SL tablet Place 1 tablet (0.4 mg total) under the tongue every 5 (five) minutes as needed for chest pain. 20 tablet 5   omeprazole (PRILOSEC) 40 MG capsule TAKE 1 CAPSULE (40 MG TOTAL) BY MOUTH DAILY. FOR HEARTBURN. 90 capsule 0   Pediatric Multiple Vitamins (FLINTSTONES MULTIVITAMIN) CHEW Chew by mouth.     Probiotic Product (PROBIOTIC ADVANCED) CAPS Take 1 capsule by mouth daily.     spironolactone (ALDACTONE) 25 MG tablet Take 1 tablet (25 mg total) by mouth 2 (two) times daily. 180 tablet 3   torsemide (DEMADEX) 20 MG tablet Take 3 tablets (60 mg total) by mouth 2 (two) times daily. 180 tablet 5   trolamine salicylate (ASPERCREME) 10 % cream Apply 1 application  topically daily as needed for muscle pain (back or joint pain).     vitamin B-12  (CYANOCOBALAMIN) 500 MCG tablet Take 500 mcg by mouth 4 (four) times  a week. 4 times weekly no more than 2000 MCG per week     feeding supplement, ENSURE ENLIVE, (ENSURE ENLIVE) LIQD Take 237 mLs by mouth 2 (two) times daily between meals. (Patient not taking: Reported on 09/16/2023)     ferrous sulfate 325 (65 FE) MG EC tablet Take 1 tablet (325 mg total) by mouth daily. (Patient not taking: Reported on 09/14/2023) 60 tablet 3   Current Facility-Administered Medications on File Prior to Visit  Medication Dose Route Frequency Provider Last Rate Last Admin   albumin human 25 % solution 25 g  25 g Intravenous Once Wyline Mood, MD        BP 128/78   Pulse (!) 56   Temp (!) 97 F (36.1 C) (Temporal)   Ht 5\' 3"  (1.6 m)   Wt 141 lb (64 kg)   SpO2 99%   BMI 24.98 kg/m  Objective:   Physical Exam HENT:     Right Ear: There is impacted cerumen.     Left Ear: There is no impacted cerumen.  Cardiovascular:     Rate and Rhythm: Normal rate and regular rhythm.     Comments: Moderate abdominal swelling, non tender Pulmonary:     Effort: Pulmonary effort is normal.     Breath sounds: Normal breath sounds.  Musculoskeletal:     Cervical back: Neck supple.  Skin:    General: Skin is warm and dry.  Neurological:     Mental Status: She is alert and oriented to person, place, and time.  Psychiatric:        Mood and Affect: Mood normal.           Assessment & Plan:  Hypothyroidism, unspecified type -     TSH -     T4, free  Screening mammogram for breast cancer -     3D Screening Mammogram, Left and Right; Future  Estrogen deficiency -     DG Bone Density; Future  Chronic migraine without aura with status migrainosus, not intractable Assessment & Plan: Controlled. Following with neurology, office notes reviewed from October 2024.   Coronary artery disease involving native coronary artery of native heart without angina pectoris Assessment & Plan: Asymptomatic.  Following with  cardiology, office notes reviewed from May 2024.  Continue lipid and blood pressure control. Continue aspirin 81 mg daily, metoprolol succinate 25 mg daily.   Essential hypertension Assessment & Plan: Controlled.  Continue metoprolol succinate 25 mg daily, spironolactone 25 mg twice daily.    HFrEF (heart failure with reduced ejection fraction) (HCC) Assessment & Plan: Stable.  Following with CHF clinic and cardiology, office notes reviewed from September 2024.  Continue torsemide 60 mg twice daily, torsemide 60 mg twice daily, spironolactone 25 mg twice daily, metoprolol succinate 25 mg daily, Farxiga 10 mg daily.   Mitral valve insufficiency, unspecified etiology Assessment & Plan: Follow with cardiology and CHF clinic, office notes reviewed from September 2024. Continue to monitor and treat conservatively as she is not a candidate for surgery.   Centrilobular emphysema (HCC) Assessment & Plan: Controlled.  Continue albuterol inhaler as needed. Discussed to resume Advair if she requires use of her albuterol more than 3 times weekly for a consistent period of time.   Cirrhosis of liver with ascites, unspecified hepatic cirrhosis type [facility]) Assessment & Plan: Following with cardiology and GI. Continue paracentesis as needed.   Gastroesophageal reflux disease, unspecified whether esophagitis present Assessment & Plan: Controlled.  Continue omeprazole 40 mg daily.   Multiple  sclerosis [facility]) Assessment & Plan: Following with neurology, office notes reviewed from October 2024.  Continue conservative treatment.   Primary osteoarthritis involving multiple joints Assessment & Plan: Stable.  Continue Cymbalta 20 mg daily.   Anemia due to stage 3a chronic kidney disease [facility]) Assessment & Plan: Following with hematology. Office notes reviewed from August 2024   Other iron deficiency anemia Assessment & Plan: Following with hematology. Reviewed office  notes from August 2024.  Continue iron infusions as needed.  Resume oral iron when iron infusions complete.   Primary insomnia Assessment & Plan: No concerns today. Continue to monitor.   Impacted cerumen of right ear Assessment & Plan: Right cerumen impaction identified on exam. Patient consented to irrigation of canals bilaterally.  Right canal irrigated. Patient tolerated well, but unable to completely remove the residual wax with irrigation.  Irrigation discontinued.  Discussed use of Debrox drops and home ear wax removal kits.            Doreene Nest, NP

## 2023-09-16 NOTE — Assessment & Plan Note (Signed)
Controlled. Following with neurology, office notes reviewed from October 2024.

## 2023-09-16 NOTE — Assessment & Plan Note (Signed)
Controlled.  Continue metoprolol succinate 25 mg daily, spironolactone 25 mg twice daily.

## 2023-09-16 NOTE — Assessment & Plan Note (Signed)
Controlled.  Continue albuterol inhaler as needed. Discussed to resume Advair if she requires use of her albuterol more than 3 times weekly for a consistent period of time.

## 2023-09-16 NOTE — Assessment & Plan Note (Signed)
Stable.  Following with CHF clinic and cardiology, office notes reviewed from September 2024.  Continue torsemide 60 mg twice daily, torsemide 60 mg twice daily, spironolactone 25 mg twice daily, metoprolol succinate 25 mg daily, Farxiga 10 mg daily.

## 2023-09-16 NOTE — Assessment & Plan Note (Signed)
Controlled.  Continue omeprazole 40 mg daily. 

## 2023-09-16 NOTE — Assessment & Plan Note (Signed)
Stable.  Continue Cymbalta 20 mg daily.

## 2023-09-16 NOTE — Assessment & Plan Note (Signed)
Following with neurology, office notes reviewed from October 2024.  Continue conservative treatment.

## 2023-09-16 NOTE — Assessment & Plan Note (Signed)
Asymptomatic.  Following with cardiology, office notes reviewed from May 2024.  Continue lipid and blood pressure control. Continue aspirin 81 mg daily, metoprolol succinate 25 mg daily.

## 2023-09-16 NOTE — Patient Instructions (Addendum)
[facility] to schedule your mammogram and bone density scan.  Stop by the lab prior to leaving today. I will notify you of your results once received.   It was a pleasure to see you today!

## 2023-09-22 NOTE — Progress Notes (Unsigned)
PCP: Doreene Nest, NP (last seen 10/24) Primary Cardiologist: Jodelle Red, MD (last seen 10/23) HF cardiologist: Marca Ancona, MD (last seen 05/24)  HPI:  Anita [name] is a 62 y/o female with a history of CAD (occluded RCA with collaterals), SVT, COPD, GERD, multiple sclerosis, pulmonary HTN, cirrhosis likely secondary to RV failure, severe valvular disease, previous tobacco use, congestive hepatopathy and chronic heart failure.   Admitted 09/16/22 due to SVT. Cardiology consult obtained. Given IV adenosine and then amiodarone drip. Lasix gtt. Plan for EP consult. Discharged after 3 days.   Echo 03/20/20: EF 40-45% with moderately elevated PA pressure, mild LAE, moderate/ severe MR Echo 02/07/21: EF of 40-45% along with severely elevated PA pressure of 69.2 mmHg, moderate LAE, severe MR, mild/ moderate Anita and moderate/severe TR.  Echo 08/22/22: EF of 30-35% along with mild LVH and severe MR/TR.  TEE 03/18/23: EF 40% with moderate LAE/ severe RAE and severe MR with restricted posterior leaflet, peak RV-RA gradient 48 mmHg, severe TR. No PFO or ASD.   LHC done 03/23/20 showed: Hemodynamics: LV end diastolic pressure is moderately elevated. ----Coronary Angiography----- Ost RCA to Dist RCA lesion is 100% stenosed.-The PDA and PL system fills via faint collaterals from the AV groove LCx and LAD septals. Mid LAD-1 lesion is 60% stenosed. Mid LAD-2 lesion is 50% stenosed. Dist LAD lesion is 55% stenosed with 70% stenosed side branch in 2nd Diag. 1st Diag lesion is 70% stenosed. Ost Cx to Prox Cx lesion is 45% stenosed. Prox-MID Cx lesion is 65% stenosed.  MODERATE-SEVERE THREE-VESSEL CAD: 100% proximal RCA occlusion with left-to-right collaterals faintly filling PDA and PL system (AV groove LCx-PL and LAD septal-PDA) Diffuse moderate mid LAD disease with 60% to 50% stenosis. Tandem 50% and 65% proximal and mid LCx with the 65% lesion being the most significant lesion. Moderately  elevated LVEDP of 18-20 mmHg  She presents today for a HF follow-up visit with a chief complaint of moderate shortness of breath with minimal exertion. Has chronic fatigue, dizziness, abdominal distention (better) and difficulty sleeping along with this. Denies chest pain, cough, palpitations, pedal edema or weight gain. Says that her difficulty sleeping is not because of SOB but because she is sleeping more during the daytime.   Does not take the flu vaccine.   She has cirrhosis likely secondary to RV failure. She had paracenteses in 3/24, 4/24, 5/24,  and most recently 09/24 with removal of 5.3 L.  ROS: All systems negative except as listed in HPI, PMH and Problem List.  SH:  Social History   Socioeconomic History   Marital status: Divorced    Spouse name: Not on file   Number of children: 2   Years of education: Not on file   Highest education level: Some college, no degree  Occupational History   Not on file  Tobacco Use   Smoking status: Former    Current packs/day: 1.00    Types: Cigarettes   Smokeless tobacco: Never  Vaping Use   Vaping status: Never Used  Substance and Sexual Activity   Alcohol use: Not Currently    Comment: Occasional   Drug use: Never   Sexual activity: Not on file  Other Topics Concern   Not on file  Social History Narrative   Right handed    Lives in one story home   Drinks Caffeine - Sweet Tea    Social Determinants of Health   Financial Resource Strain: Patient Declined (09/15/2023)   Overall Physicist, medical Strain (  CARDIA)    Difficulty of Paying Living Expenses: Patient declined  Food Insecurity: No Food Insecurity (09/15/2023)   Hunger Vital Sign    Worried About Running Out of Food in the Last Year: Never true    Ran Out of Food in the Last Year: Never true  Transportation Needs: No Transportation Needs (09/15/2023)   PRAPARE - Administrator, Civil Service (Medical): No    Lack of Transportation (Non-Medical): No   Physical Activity: Unknown (09/15/2023)   Exercise Vital Sign    Days of Exercise per Week: 0 days    Minutes of Exercise per Session: Not on file  Stress: No Stress Concern Present (09/15/2023)   [employer] of Occupational Health - Occupational Stress Questionnaire    Feeling of Stress : Not at all  Social Connections: Socially Isolated (09/15/2023)   Social Connection and Isolation Panel [NHANES]    Frequency of Communication with Friends and Family: Three times a week    Frequency of Social Gatherings with Friends and Family: More than three times a week    Attends Religious Services: Never    Database administrator or Organizations: No    Attends Engineer, structural: Not on file    Marital Status: Divorced  Intimate Partner Violence: Not At Risk (09/17/2022)   Humiliation, Afraid, Rape, and Kick questionnaire    Fear of Current or Ex-Partner: No    Emotionally Abused: No    Physically Abused: No    Sexually Abused: No    FH:  Family History  Problem Relation Age of Onset   Skin cancer Mother    Lung cancer Father    Uterine cancer Sister    Heart disease Brother    Bipolar disorder Sister    Throat cancer Paternal Grandmother     Past Medical History:  Diagnosis Date   Abnormality of gait 07/26/2013   Acute kidney injury superimposed on stage IIIa chronic kidney disease (HCC) 03/20/2020   Acute on chronic combined systolic and diastolic CHF (congestive heart failure) (HCC) 10/08/2020   Acute on chronic HFrEF (heart failure with reduced ejection fraction) (HCC) 03/22/2020   Acute respiratory failure with hypoxia (HCC) 03/20/2020   Allergy    Arthritis    Atypical pneumonia 03/22/2020   CAD (coronary artery disease)    a. 03/2020 Cath: LM nl, LAD 60/61m, 55d, D1 70, D2 70, LCX 45ost/p, 65p, RCA 100ost CTO. RPDA fills via collats from dLAD. Inf septal fills via collats from 1st septal, RPAV fills via collats from LPAV, RPL1/2 sev dzs-->med rx.    Chickenpox    Chronic combined systolic (congestive) and diastolic (congestive) heart failure (HCC)    a. 02/2021 Echo: EF 40-45%, glob HK, gr2 DD. Sev red RV fxn, RVSP 69.39mmHg. Mod dil LA, sev dil RA. Sev MR. Mild to mod Anita. Mean MV grad 6.31mmHg. Mod-Sev TR. Mild AI. Mild Ao sclerosis w/o stenosis.   COPD (chronic obstructive pulmonary disease) (HCC)    Elevated troponin 03/22/2020   GERD (gastroesophageal reflux disease)    Headache(784.0)    Migraine   Hearing loss    Right ear secondary to infection   History of shingles    Ischemic cardiomyopathy    a. 02/2021 Echo: EF 40-45%, glob HK.   Migraines    Multiple sclerosis (HCC)    Obese    Optic neuritis    PAH (pulmonary artery hypertension) (HCC)    a. 02/2021 Echo: RVSP 69.36mmHg.   Prolonged QT  interval 03/20/2020   Severe mitral regurgitation    a. 02/2021 Echo: Severe MR w/ mild to mod Anita.  Mean MV grad 6.5mmHg.    Current Outpatient Medications  Medication Sig Dispense Refill   amiodarone (PACERONE) 200 MG tablet Take 0.5 tablets (100 mg total) by mouth daily. 180 tablet 3   aspirin EC 81 MG tablet Take 1 tablet (81 mg total) by mouth daily. Swallow whole. 30 tablet 0   atorvastatin (LIPITOR) 20 MG tablet Take 1 tablet by mouth everyday at bedtime.  Please call [phone] to schedule an appointment with Dr. Cristal Deer for future refills. Thank you. 30 tablet 0   dapagliflozin propanediol (FARXIGA) 10 MG TABS tablet Take 1 tablet (10 mg total) by mouth daily before breakfast. 30 tablet 11   DULoxetine (CYMBALTA) 20 MG capsule TAKE 1 CAPSULE (20 MG TOTAL) BY MOUTH DAILY. FOR ANXIETY AND PAIN. 90 capsule 0   feeding supplement, ENSURE ENLIVE, (ENSURE ENLIVE) LIQD Take 237 mLs by mouth 2 (two) times daily between meals. (Patient not taking: Reported on 09/16/2023)     ferrous sulfate 325 (65 FE) MG EC tablet Take 1 tablet (325 mg total) by mouth daily. (Patient not taking: Reported on 09/14/2023) 60 tablet 3    fluticasone-salmeterol (ADVAIR DISKUS) 250-50 MCG/ACT AEPB INHALE 1 PUFF INTO THE LUNGS TWICE A DAY 60 each 5   KLOR-CON M20 20 MEQ tablet TAKE 2 TABLETS BY MOUTH 2 TIMES DAILY. 360 tablet 1   levothyroxine (SYNTHROID) 25 MCG tablet TAKE 1 TABLET BY MOUTH DAILY BEFORE BREAKFAST. 90 tablet 1   metoprolol succinate (TOPROL-XL) 25 MG 24 hr tablet TAKE 1 TABLET (25 MG TOTAL) BY MOUTH DAILY. 90 tablet 3   nitroGLYCERIN (NITROSTAT) 0.4 MG SL tablet Place 1 tablet (0.4 mg total) under the tongue every 5 (five) minutes as needed for chest pain. 20 tablet 5   omeprazole (PRILOSEC) 40 MG capsule TAKE 1 CAPSULE (40 MG TOTAL) BY MOUTH DAILY. FOR HEARTBURN. 90 capsule 0   Pediatric Multiple Vitamins (FLINTSTONES MULTIVITAMIN) CHEW Chew by mouth.     Probiotic Product (PROBIOTIC ADVANCED) CAPS Take 1 capsule by mouth daily.     spironolactone (ALDACTONE) 25 MG tablet Take 1 tablet (25 mg total) by mouth 2 (two) times daily. 180 tablet 3   torsemide (DEMADEX) 20 MG tablet Take 3 tablets (60 mg total) by mouth 2 (two) times daily. 180 tablet 5   trolamine salicylate (ASPERCREME) 10 % cream Apply 1 application  topically daily as needed for muscle pain (back or joint pain).     vitamin B-12 (CYANOCOBALAMIN) 500 MCG tablet Take 500 mcg by mouth 4 (four) times a week. 4 times weekly no more than 2000 MCG per week     Current Facility-Administered Medications  Medication Dose Route Frequency Provider Last Rate Last Admin   albumin human 25 % solution 25 g  25 g Intravenous Once Wyline Mood, MD       Vitals:   09/23/23 1006  BP: 110/61  Pulse: (!) 53  Resp: 14  SpO2: 100%  Weight: 142 lb (64.4 kg)   Wt Readings from Last 3 Encounters:  09/23/23 142 lb (64.4 kg)  09/16/23 141 lb (64 kg)  09/14/23 140 lb (63.5 kg)   Lab Results  Component Value Date   CREATININE 1.65 (H) 08/20/2023   CREATININE 1.62 (H) 05/22/2023   CREATININE 1.88 (H) 05/08/2023    PHYSICAL EXAM:  General:  Well appearing. No resp  difficulty HEENT: normal Neck: supple.JVP  normal. No lymphadenopathy or thryomegaly appreciated. Cor: PMI normal. Regular rhythm, bradycardic. No rubs, gallops, 2/6 HSM at apex Lungs: clear Abdomen: soft, nontender, minimal distention No hepatosplenomegaly. No bruits or masses.  Extremities: no cyanosis, clubbing, rash, edema Neuro: alert & oriented x3, cranial nerves grossly intact. Moves all 4 extremities w/o difficulty. Affect pleasant.   ECG: not done   ASSESSMENT & PLAN:  1: Chronic heart failure with reduced ejection fraction- - suspect mixed ischemic and valvular disease - NYHA class III - euvolemic today - weighing daily; reminded to call for an overnight weight gain of > 2 pounds or a weekly weight gain of > 5 pounds - weight down 3 pounds since her last visit here 1 month ago - Echo 03/20/20: EF 40-45% with moderately elevated PA pressure, mild LAE, moderate/ severe MR - Echo 02/07/21: EF of 40-45% along with severely elevated PA pressure of 69.2 mmHg, moderate LAE, severe MR, mild/ moderate Anita and moderate/severe TR.  - Echo 08/22/22: EF of 30-35% along with mild LVH and severe MR/TR.  - TEE 03/18/23: EF 40% with moderate LAE/ severe RAE and severe MR with restricted posterior leaflet, peak RV-RA gradient 48 mmHg, severe TR. No PFO or ASD.  - not adding salt and daughter says that she doesn't cook with salt either - saw [facility] provider Shirlee Latch) 05/24 - continue metoprolol succinate 25mg  daily - continue farxiga 10mg  daily - continue spironolactone 25mg  BID - continue torsemide 60mg  BID - had been hypotensive with entresto in the past - palliative care visit done 09/10/22 - BNP 08/20/23 was 1439.7  2: HTN- - BP 110/61 - saw PCP Chestine Spore) 10/24 - BMP 08/20/23 showed sodium 136, potassium 4.1, creatinine 1.65 & GFR 35   3: CAD- - saw cardiology Dan Humphreys) 10/23 - continue atorvastatin 20mg  daily - continue ASA 81mg  daily - LHC done 03/23/20 showed: Hemodynamics: LV end diastolic  pressure is moderately elevated. ----Coronary Angiography----- Ost RCA to Dist RCA lesion is 100% stenosed.-The PDA and PL system fills via faint collaterals from the AV groove LCx and LAD septals. Mid LAD-1 lesion is 60% stenosed. Mid LAD-2 lesion is 50% stenosed. Dist LAD lesion is 55% stenosed with 70% stenosed side branch in 2nd Diag. 1st Diag lesion is 70% stenosed. Ost Cx to Prox Cx lesion is 45% stenosed. Prox-MID Cx lesion is 65% stenosed.  MODERATE-SEVERE THREE-VESSEL CAD: 100% proximal RCA occlusion with left-to-right collaterals faintly filling PDA and PL system (AV groove LCx-PL and LAD septal-PDA) Diffuse moderate mid LAD disease with 60% to 50% stenosis. Tandem 50% and 65% proximal and mid LCx with the 65% lesion being the most significant lesion. Moderately elevated LVEDP of 18-20 mmHg  4: NSVT- - continue amiodarone 100mg  daily - required adenosine to terminate 10/23 - needs regular eye exams - TSH 05/22/23 was 14.093 - LDL 01/01/23 was 56  5: Cirrhosis w/ ascites- - suspect cardiogenic cirrhosis due to RV failure - periodic paracentesis; last one done 09/24 with removal of 5.3L; If the patient eventually requires >/=2 paracenteses in a 30 day period, candidacy for formal evaluation by the [facility] will be assessed - saw GI Tobi Bastos) 01/24  6: Valvular heart disease- - TEE 03/18/23: EF 40% with moderate LAE/ severe RAE and severe MR. No PFO or ASD - does not qualifiy for mTEER due to cirrhosis, Anita and fraility. Dr Shirlee Latch discussed with structural heart team and even if it could be done, this would not fix her TR which is also  a large contributing factor - eventual hospice referral has previously been discussed  7: Anemia- - saw hematology Cathie Hoops) 08/24 - received iron infusions twice in August - Hg 07/28/23 was 10.5  Return in 3-4 months, sooner if needed

## 2023-09-23 ENCOUNTER — Encounter: Payer: Self-pay | Admitting: Family

## 2023-09-23 ENCOUNTER — Ambulatory Visit: Payer: Medicare Other | Attending: Family | Admitting: Family

## 2023-09-23 VITALS — BP 110/61 | HR 53 | Resp 14 | Wt 142.0 lb

## 2023-09-23 DIAGNOSIS — J449 Chronic obstructive pulmonary disease, unspecified: Secondary | ICD-10-CM | POA: Diagnosis not present

## 2023-09-23 DIAGNOSIS — Z87891 Personal history of nicotine dependence: Secondary | ICD-10-CM | POA: Diagnosis not present

## 2023-09-23 DIAGNOSIS — I10 Essential (primary) hypertension: Secondary | ICD-10-CM | POA: Diagnosis not present

## 2023-09-23 DIAGNOSIS — G35 Multiple sclerosis: Secondary | ICD-10-CM | POA: Insufficient documentation

## 2023-09-23 DIAGNOSIS — R140 Abdominal distension (gaseous): Secondary | ICD-10-CM | POA: Insufficient documentation

## 2023-09-23 DIAGNOSIS — R188 Other ascites: Secondary | ICD-10-CM

## 2023-09-23 DIAGNOSIS — Z7984 Long term (current) use of oral hypoglycemic drugs: Secondary | ICD-10-CM | POA: Diagnosis not present

## 2023-09-23 DIAGNOSIS — I4729 Other ventricular tachycardia: Secondary | ICD-10-CM | POA: Diagnosis not present

## 2023-09-23 DIAGNOSIS — Z79899 Other long term (current) drug therapy: Secondary | ICD-10-CM | POA: Diagnosis not present

## 2023-09-23 DIAGNOSIS — K7460 Unspecified cirrhosis of liver: Secondary | ICD-10-CM | POA: Diagnosis not present

## 2023-09-23 DIAGNOSIS — I471 Supraventricular tachycardia, unspecified: Secondary | ICD-10-CM | POA: Insufficient documentation

## 2023-09-23 DIAGNOSIS — N1831 Chronic kidney disease, stage 3a: Secondary | ICD-10-CM | POA: Diagnosis not present

## 2023-09-23 DIAGNOSIS — K219 Gastro-esophageal reflux disease without esophagitis: Secondary | ICD-10-CM | POA: Diagnosis not present

## 2023-09-23 DIAGNOSIS — I472 Ventricular tachycardia, unspecified: Secondary | ICD-10-CM | POA: Insufficient documentation

## 2023-09-23 DIAGNOSIS — Z7982 Long term (current) use of aspirin: Secondary | ICD-10-CM | POA: Diagnosis not present

## 2023-09-23 DIAGNOSIS — D508 Other iron deficiency anemias: Secondary | ICD-10-CM

## 2023-09-23 DIAGNOSIS — D631 Anemia in chronic kidney disease: Secondary | ICD-10-CM | POA: Insufficient documentation

## 2023-09-23 DIAGNOSIS — R0602 Shortness of breath: Secondary | ICD-10-CM | POA: Insufficient documentation

## 2023-09-23 DIAGNOSIS — I5022 Chronic systolic (congestive) heart failure: Secondary | ICD-10-CM

## 2023-09-23 DIAGNOSIS — I340 Nonrheumatic mitral (valve) insufficiency: Secondary | ICD-10-CM

## 2023-09-23 DIAGNOSIS — I2721 Secondary pulmonary arterial hypertension: Secondary | ICD-10-CM | POA: Diagnosis not present

## 2023-09-23 DIAGNOSIS — I2510 Atherosclerotic heart disease of native coronary artery without angina pectoris: Secondary | ICD-10-CM

## 2023-09-23 DIAGNOSIS — I130 Hypertensive heart and chronic kidney disease with heart failure and stage 1 through stage 4 chronic kidney disease, or unspecified chronic kidney disease: Secondary | ICD-10-CM | POA: Insufficient documentation

## 2023-09-24 ENCOUNTER — Other Ambulatory Visit: Payer: Self-pay

## 2023-09-24 DIAGNOSIS — I5022 Chronic systolic (congestive) heart failure: Secondary | ICD-10-CM

## 2023-09-24 LAB — BASIC METABOLIC PANEL
BUN/Creatinine Ratio: 22 (ref 12–28)
BUN: 43 mg/dL — ABNORMAL HIGH (ref 8–27)
CO2: 17 mmol/L — ABNORMAL LOW (ref 20–29)
Calcium: 8.8 mg/dL (ref 8.7–10.3)
Chloride: 106 mmol/L (ref 96–106)
Creatinine, Ser: 1.95 mg/dL — ABNORMAL HIGH (ref 0.57–1.00)
Glucose: 86 mg/dL (ref 70–99)
Potassium: 5.1 mmol/L (ref 3.5–5.2)
Sodium: 137 mmol/L (ref 134–144)
eGFR: 29 mL/min/{1.73_m2} — ABNORMAL LOW (ref >59)

## 2023-09-25 ENCOUNTER — Other Ambulatory Visit (HOSPITAL_BASED_OUTPATIENT_CLINIC_OR_DEPARTMENT_OTHER): Payer: Self-pay | Admitting: Family

## 2023-10-07 ENCOUNTER — Other Ambulatory Visit (HOSPITAL_BASED_OUTPATIENT_CLINIC_OR_DEPARTMENT_OTHER): Payer: Self-pay | Admitting: Cardiology

## 2023-10-11 ENCOUNTER — Other Ambulatory Visit (HOSPITAL_BASED_OUTPATIENT_CLINIC_OR_DEPARTMENT_OTHER): Payer: Self-pay | Admitting: Cardiology

## 2023-10-15 ENCOUNTER — Other Ambulatory Visit: Payer: Self-pay | Admitting: Primary Care

## 2023-10-15 DIAGNOSIS — K219 Gastro-esophageal reflux disease without esophagitis: Secondary | ICD-10-CM

## 2023-10-26 ENCOUNTER — Telehealth (HOSPITAL_COMMUNITY): Payer: Self-pay

## 2023-10-26 NOTE — Telephone Encounter (Signed)
Received referral for pt for [facility].  Will reach out to her to sch appointment.    Kerry Hough, EMT-Paramedic  [phone] 10/26/2023

## 2023-11-11 ENCOUNTER — Other Ambulatory Visit: Payer: Self-pay | Admitting: Family

## 2023-11-11 ENCOUNTER — Other Ambulatory Visit (HOSPITAL_COMMUNITY): Payer: Self-pay

## 2023-11-11 MED ORDER — AMIODARONE HCL 100 MG PO TABS: 100.0000 mg | ORAL_TABLET | Freq: Every day | ORAL | 3 refills | Status: AC

## 2023-11-11 MED ORDER — ATORVASTATIN CALCIUM 20 MG PO TABS: ORAL_TABLET | ORAL | 3 refills | Status: DC

## 2023-11-11 NOTE — Progress Notes (Signed)
Paramedicine Encounter    Patient ID: Anita [name], female    DOB: 1961-11-19, 62 y.o.   MRN: [id number]  SOCIAL/MEDICAL BARRIERS:  PHARMACY USED -[facility] in [place]    MED ISSUES:  AFFORDABILITY-none   PT ASSIST APPS NEEDED-n/a  PCP-dr clark    INSURANCE-yes   SOURCE OF INCOME-disability   TRANSPORTATION-daughter   FOOD INSECURITIES/NEEDS-none  FOOD STAMPS-none  REVIEWED DIET/FLUID/SALT RESTRICTIONS-yes  RENT/OWN HOME ISSUES-none   SOCIAL SUPPORT-lives with daughter, son lives in [place], brother at beach    SAFETY/DOMESTIC ISSUES-none  SUBSTANCE ABUSE-none   DAILY WEIGHTS-it broke--has not replaced it yet--then daughter said it needed batteries   EDUCATE ON DISEASE PROCESS/SYMPTOMS/PURPOSES OF MEDS-yes  Complaints-always dizzy, difficulty going to sleep   Edema-none  Compliance with meds-yes  Pill box filled-yes If so, by whom-daughter  Refills needed-daughter handles  First visit with the pt in the home. She lives with her daughter who takes care of her. Daughter works from home.   Reviewed fluid restrictions-she drinks more sprite than water so we talked about to half that out if possible.  Eats a 12' roast beef sub daily-she eats toast for breakfast and snacks through out the day. So we discussed salt intake as well.  The more she talks the more sob she feels.  She has MS. She uses rollator, she feels her balance is getting worse making it more difficult to ambulate coming from the MS.  She has cycled back around to having difficulty going to sleep and her sleep pattern is off.   -amio-needs to change in chart-auto refill got approved by another office that didn't check the dosage  -atorvastatin--needs refills sent by clinic- her other cardiology refused it -sent message to clinic to fix this.   --I seen a note to her mychart that didn't get to the patient about decreasing her K to 2 tabs daily and to get repeat labs in 2wks. This was back  in oct.  Sent message to [facility] to verify if this is still the plan and Inetta Fermo confirmed this was, this was relayed back to daughter.   Daughter fills pill box, I checked it and it was all correct.  Will continue to follow.   BP 118/60   Pulse (!) 52   Resp 16   SpO2 98%  Weight yesterday--scales broke --last weight a few wks ago was 146 at home Last visit weight-142 @ clinic   Patient Care Team: Doreene Nest, NP as PCP - General (Internal Medicine) Jodelle Red, MD as PCP - Cardiology (Cardiology) Rickard Patience, MD as Consulting Physician (Hematology and Oncology) Drema Dallas, DO as Consulting Physician (Neurology)  Patient Active Problem List   Diagnosis Date Noted   Cerumen impaction 09/16/2023   CKD (chronic kidney disease) stage 3, GFR 30-59 ml/min (HCC) 04/20/2023   Anemia due to stage 3a chronic kidney disease (HCC) 10/01/2022   Dilated cardiomyopathy (HCC) 09/19/2022   Cirrhosis of liver with ascites (HCC) 09/19/2022   Generalized weakness    Paroxysmal SVT (supraventricular tachycardia) (HCC) 09/16/2022   Stage 3b chronic kidney disease (HCC) 09/16/2022   HFrEF (heart failure with reduced ejection fraction) (HCC) 09/16/2022   Hypervolemia    Cardiorenal syndrome    Hypokalemia 08/27/2022   Transaminitis 08/23/2022   Elevated liver enzymes    Mitral valve insufficiency 05/19/2021   Hypoxia 05/19/2021   Anasarca 05/19/2021   Esophageal dysphagia 01/08/2021   Ischemic cardiomyopathy 10/08/2020   Coronary artery disease involving native coronary artery of  native heart without angina pectoris 10/08/2020   IDA (iron deficiency anemia) 10/05/2020   Vitamin B12 deficiency 08/23/2020   COPD (chronic obstructive pulmonary disease) (HCC) 06/22/2020   Tobacco abuse 03/22/2020   Pulmonary hypertension (HCC) 03/22/2020   Insomnia 03/22/2020   SIRS (systemic inflammatory response syndrome) (HCC) 03/20/2020   Normocytic anemia 03/20/2020    Gastroesophageal reflux disease 02/15/2018   Chronic migraine without aura with status migrainosus, not intractable 02/15/2018   Osteoarthritis 02/15/2018   Essential hypertension 02/15/2018   Disturbance of skin sensation 03/26/2012   Multiple sclerosis (HCC) 03/26/2012    Current Outpatient Medications:    aspirin EC 81 MG tablet, Take 1 tablet (81 mg total) by mouth daily. Swallow whole., Disp: 30 tablet, Rfl: 0   dapagliflozin propanediol (FARXIGA) 10 MG TABS tablet, Take 1 tablet (10 mg total) by mouth daily before breakfast., Disp: 30 tablet, Rfl: 11   DULoxetine (CYMBALTA) 20 MG capsule, TAKE 1 CAPSULE (20 MG TOTAL) BY MOUTH DAILY. FOR ANXIETY AND PAIN., Disp: 90 capsule, Rfl: 0   fluticasone-salmeterol (ADVAIR DISKUS) 250-50 MCG/ACT AEPB, INHALE 1 PUFF INTO THE LUNGS TWICE A DAY, Disp: 60 each, Rfl: 5   KLOR-CON M20 20 MEQ tablet, TAKE 2 TABLETS BY MOUTH 2 TIMES DAILY., Disp: 360 tablet, Rfl: 1   levothyroxine (SYNTHROID) 25 MCG tablet, TAKE 1 TABLET BY MOUTH DAILY BEFORE BREAKFAST., Disp: 90 tablet, Rfl: 1   metoprolol succinate (TOPROL-XL) 25 MG 24 hr tablet, TAKE 1 TABLET (25 MG TOTAL) BY MOUTH DAILY., Disp: 90 tablet, Rfl: 3   omeprazole (PRILOSEC) 40 MG capsule, TAKE 1 CAPSULE (40 MG TOTAL) BY MOUTH DAILY. FOR HEARTBURN. (Patient taking differently: Take 40 mg by mouth at bedtime. For heartburn.), Disp: 90 capsule, Rfl: 2   Pediatric Multiple Vitamins (FLINTSTONES MULTIVITAMIN) CHEW, Chew by mouth., Disp: , Rfl:    Probiotic Product (PROBIOTIC ADVANCED) CAPS, Take 1 capsule by mouth daily., Disp: , Rfl:    spironolactone (ALDACTONE) 25 MG tablet, Take 1 tablet (25 mg total) by mouth 2 (two) times daily., Disp: 180 tablet, Rfl: 3   torsemide (DEMADEX) 20 MG tablet, Take 3 tablets (60 mg total) by mouth 2 (two) times daily., Disp: 180 tablet, Rfl: 5   vitamin B-12 (CYANOCOBALAMIN) 500 MCG tablet, Take 500 mcg by mouth 4 (four) times a week. 4 times weekly no more than 2000 MCG per  week, Disp: , Rfl:    amiodarone (PACERONE) 100 MG tablet, Take 1 tablet (100 mg total) by mouth daily., Disp: 90 tablet, Rfl: 3   atorvastatin (LIPITOR) 20 MG tablet, Take 1 tablet daily, Disp: 90 tablet, Rfl: 3   feeding supplement, ENSURE ENLIVE, (ENSURE ENLIVE) LIQD, Take 237 mLs by mouth 2 (two) times daily between meals. (Patient not taking: Reported on 09/16/2023), Disp: , Rfl:    ferrous sulfate 325 (65 FE) MG EC tablet, Take 1 tablet (325 mg total) by mouth daily. (Patient not taking: Reported on 09/14/2023), Disp: 60 tablet, Rfl: 3   nitroGLYCERIN (NITROSTAT) 0.4 MG SL tablet, Place 1 tablet (0.4 mg total) under the tongue every 5 (five) minutes as needed for chest pain., Disp: 20 tablet, Rfl: 5   trolamine salicylate (ASPERCREME) 10 % cream, Apply 1 application  topically daily as needed for muscle pain (back or joint pain)., Disp: , Rfl:   Current Facility-Administered Medications:    albumin human 25 % solution 25 g, 25 g, Intravenous, Once, Wyline Mood, MD Allergies  Allergen Reactions   Acthar Hp [Corticotropin] Other (See Comments)  Throat swelling and coughing up blood   Codeine    Prednisone       Social History   Socioeconomic History   Marital status: Divorced    Spouse name: Not on file   Number of children: 2   Years of education: Not on file   Highest education level: Some college, no degree  Occupational History   Not on file  Tobacco Use   Smoking status: Former    Current packs/day: 1.00    Types: Cigarettes   Smokeless tobacco: Never  Vaping Use   Vaping status: Never Used  Substance and Sexual Activity   Alcohol use: Not Currently    Comment: Occasional   Drug use: Never   Sexual activity: Not on file  Other Topics Concern   Not on file  Social History Narrative   Right handed    Lives in one story home   Drinks Caffeine - Sweet Tea    Social Determinants of Health   Financial Resource Strain: Patient Declined (09/15/2023)   Overall  Financial Resource Strain (CARDIA)    Difficulty of Paying Living Expenses: Patient declined  Food Insecurity: No Food Insecurity (09/15/2023)   Hunger Vital Sign    Worried About Running Out of Food in the Last Year: Never true    Ran Out of Food in the Last Year: Never true  Transportation Needs: No Transportation Needs (09/15/2023)   PRAPARE - Administrator, Civil Service (Medical): No    Lack of Transportation (Non-Medical): No  Physical Activity: Unknown (09/15/2023)   Exercise Vital Sign    Days of Exercise per Week: 0 days    Minutes of Exercise per Session: Not on file  Stress: No Stress Concern Present (09/15/2023)   [employer] of Occupational Health - Occupational Stress Questionnaire    Feeling of Stress : Not at all  Social Connections: Socially Isolated (09/15/2023)   Social Connection and Isolation Panel [NHANES]    Frequency of Communication with Friends and Family: Three times a week    Frequency of Social Gatherings with Friends and Family: More than three times a week    Attends Religious Services: Never    Database administrator or Organizations: No    Attends Banker Meetings: Not on file    Marital Status: Divorced  Intimate Partner Violence: Not At Risk (09/17/2022)   Humiliation, Afraid, Rape, and Kick questionnaire    Fear of Current or Ex-Partner: No    Emotionally Abused: No    Physically Abused: No    Sexually Abused: No    Physical Exam      Future Appointments  Date Time Provider Department Center  11/23/2023 11:45 AM [facility] None  11/26/2023  2:30 PM Rickard Patience, MD [facility] None  11/26/2023  2:45 PM [facility] INFUSION CHAIR 4 [facility] None  12/24/2023  2:30 PM Delma Freeze, FNP [facility] None  09/15/2024  9:50 AM Drema Dallas, DO [facility] None       Kerry Hough, Paramedic [phone] [facility] Paramedic  11/11/23

## 2023-11-19 ENCOUNTER — Other Ambulatory Visit (HOSPITAL_COMMUNITY): Payer: Self-pay

## 2023-11-19 NOTE — Progress Notes (Signed)
Paramedicine Encounter    Patient ID: Anita [name], female    DOB: 03-07-1961, 62 y.o.   MRN: [id number]   Complaints-same as usual-nothing abnormal   Edema-bloated   Compliance with meds-yes  Pill box filled-yes  If so, by whom-daughter fills pill box   Refills needed-none      Pt reports she is doing ok. Daughter is managing her meds. She feels like her sob is increased with laying down, but she is not using her inhaler like she is suppose to. So unsure if this is copd related or chf.  Will f/u next week.  She has been doing the potassium 1 tabs BID and then getting more labs done next week.  Just started weighing today so dont really have a home weight for baseline to compare. She did report some constipation, daughter got her some colace to use.  Will f/u next week.    BP 104/60   Pulse 60   Resp 18   Wt 148 lb (67.1 kg)   SpO2 98%   BMI 26.22 kg/m  Weight yesterday-? Last visit weight-145   Patient Care Team: Doreene Nest, NP as PCP - General (Internal Medicine) Jodelle Red, MD as PCP - Cardiology (Cardiology) Rickard Patience, MD as Consulting Physician (Hematology and Oncology) Drema Dallas, DO as Consulting Physician (Neurology)  Patient Active Problem List   Diagnosis Date Noted   Cerumen impaction 09/16/2023   CKD (chronic kidney disease) stage 3, GFR 30-59 ml/min (HCC) 04/20/2023   Anemia due to stage 3a chronic kidney disease (HCC) 10/01/2022   Dilated cardiomyopathy (HCC) 09/19/2022   Cirrhosis of liver with ascites (HCC) 09/19/2022   Generalized weakness    Paroxysmal SVT (supraventricular tachycardia) (HCC) 09/16/2022   Stage 3b chronic kidney disease (HCC) 09/16/2022   HFrEF (heart failure with reduced ejection fraction) (HCC) 09/16/2022   Hypervolemia    Cardiorenal syndrome    Hypokalemia 08/27/2022   Transaminitis 08/23/2022   Elevated liver enzymes    Mitral valve insufficiency 05/19/2021   Hypoxia 05/19/2021   Anasarca  05/19/2021   Esophageal dysphagia 01/08/2021   Ischemic cardiomyopathy 10/08/2020   Coronary artery disease involving native coronary artery of native heart without angina pectoris 10/08/2020   IDA (iron deficiency anemia) 10/05/2020   Vitamin B12 deficiency 08/23/2020   COPD (chronic obstructive pulmonary disease) (HCC) 06/22/2020   Tobacco abuse 03/22/2020   Pulmonary hypertension (HCC) 03/22/2020   Insomnia 03/22/2020   SIRS (systemic inflammatory response syndrome) (HCC) 03/20/2020   Normocytic anemia 03/20/2020   Gastroesophageal reflux disease 02/15/2018   Chronic migraine without aura with status migrainosus, not intractable 02/15/2018   Osteoarthritis 02/15/2018   Essential hypertension 02/15/2018   Disturbance of skin sensation 03/26/2012   Multiple sclerosis (HCC) 03/26/2012    Current Outpatient Medications:    amiodarone (PACERONE) 100 MG tablet, Take 1 tablet (100 mg total) by mouth daily., Disp: 90 tablet, Rfl: 3   aspirin EC 81 MG tablet, Take 1 tablet (81 mg total) by mouth daily. Swallow whole., Disp: 30 tablet, Rfl: 0   atorvastatin (LIPITOR) 20 MG tablet, Take 1 tablet daily, Disp: 90 tablet, Rfl: 3   dapagliflozin propanediol (FARXIGA) 10 MG TABS tablet, Take 1 tablet (10 mg total) by mouth daily before breakfast., Disp: 30 tablet, Rfl: 11   DULoxetine (CYMBALTA) 20 MG capsule, TAKE 1 CAPSULE (20 MG TOTAL) BY MOUTH DAILY. FOR ANXIETY AND PAIN., Disp: 90 capsule, Rfl: 0   fluticasone-salmeterol (ADVAIR DISKUS) 250-50 MCG/ACT AEPB, INHALE 1  PUFF INTO THE LUNGS TWICE A DAY, Disp: 60 each, Rfl: 5   levothyroxine (SYNTHROID) 25 MCG tablet, TAKE 1 TABLET BY MOUTH DAILY BEFORE BREAKFAST., Disp: 90 tablet, Rfl: 1   metoprolol succinate (TOPROL-XL) 25 MG 24 hr tablet, TAKE 1 TABLET (25 MG TOTAL) BY MOUTH DAILY., Disp: 90 tablet, Rfl: 3   omeprazole (PRILOSEC) 40 MG capsule, TAKE 1 CAPSULE (40 MG TOTAL) BY MOUTH DAILY. FOR HEARTBURN. (Patient taking differently: Take 40 mg by  mouth at bedtime. For heartburn.), Disp: 90 capsule, Rfl: 2   Pediatric Multiple Vitamins (FLINTSTONES MULTIVITAMIN) CHEW, Chew by mouth., Disp: , Rfl:    Probiotic Product (PROBIOTIC ADVANCED) CAPS, Take 1 capsule by mouth daily., Disp: , Rfl:    spironolactone (ALDACTONE) 25 MG tablet, Take 1 tablet (25 mg total) by mouth 2 (two) times daily., Disp: 180 tablet, Rfl: 3   torsemide (DEMADEX) 20 MG tablet, Take 3 tablets (60 mg total) by mouth 2 (two) times daily., Disp: 180 tablet, Rfl: 5   trolamine salicylate (ASPERCREME) 10 % cream, Apply 1 application  topically daily as needed for muscle pain (back or joint pain)., Disp: , Rfl:    vitamin B-12 (CYANOCOBALAMIN) 500 MCG tablet, Take 500 mcg by mouth 4 (four) times a week. 4 times weekly no more than 2000 MCG per week, Disp: , Rfl:    feeding supplement, ENSURE ENLIVE, (ENSURE ENLIVE) LIQD, Take 237 mLs by mouth 2 (two) times daily between meals. (Patient not taking: Reported on 09/16/2023), Disp: , Rfl:    ferrous sulfate 325 (65 FE) MG EC tablet, Take 1 tablet (325 mg total) by mouth daily. (Patient not taking: Reported on 09/14/2023), Disp: 60 tablet, Rfl: 3   KLOR-CON M20 20 MEQ tablet, TAKE 2 TABLETS BY MOUTH 2 TIMES DAILY., Disp: 360 tablet, Rfl: 1   nitroGLYCERIN (NITROSTAT) 0.4 MG SL tablet, Place 1 tablet (0.4 mg total) under the tongue every 5 (five) minutes as needed for chest pain., Disp: 20 tablet, Rfl: 5  Current Facility-Administered Medications:    albumin human 25 % solution 25 g, 25 g, Intravenous, Once, Wyline Mood, MD Allergies  Allergen Reactions   Acthar Hp [Corticotropin] Other (See Comments)    Throat swelling and coughing up blood   Codeine    Prednisone       Social History   Socioeconomic History   Marital status: Divorced    Spouse name: Not on file   Number of children: 2   Years of education: Not on file   Highest education level: Some college, no degree  Occupational History   Not on file  Tobacco Use    Smoking status: Former    Current packs/day: 1.00    Types: Cigarettes   Smokeless tobacco: Never  Vaping Use   Vaping status: Never Used  Substance and Sexual Activity   Alcohol use: Not Currently    Comment: Occasional   Drug use: Never   Sexual activity: Not on file  Other Topics Concern   Not on file  Social History Narrative   Right handed    Lives in one story home   Drinks Caffeine - Sweet Tea    Social Drivers of Health   Financial Resource Strain: Patient Declined (09/15/2023)   Overall Financial Resource Strain (CARDIA)    Difficulty of Paying Living Expenses: Patient declined  Food Insecurity: No Food Insecurity (09/15/2023)   Hunger Vital Sign    Worried About Running Out of Food in the Last Year: Never true  Ran Out of Food in the Last Year: Never true  Transportation Needs: No Transportation Needs (09/15/2023)   PRAPARE - Administrator, Civil Service (Medical): No    Lack of Transportation (Non-Medical): No  Physical Activity: Unknown (09/15/2023)   Exercise Vital Sign    Days of Exercise per Week: 0 days    Minutes of Exercise per Session: Not on file  Stress: No Stress Concern Present (09/15/2023)   [employer] of Occupational Health - Occupational Stress Questionnaire    Feeling of Stress : Not at all  Social Connections: Socially Isolated (09/15/2023)   Social Connection and Isolation Panel [NHANES]    Frequency of Communication with Friends and Family: Three times a week    Frequency of Social Gatherings with Friends and Family: More than three times a week    Attends Religious Services: Never    Database administrator or Organizations: No    Attends Banker Meetings: Not on file    Marital Status: Divorced  Intimate Partner Violence: Not At Risk (09/17/2022)   Humiliation, Afraid, Rape, and Kick questionnaire    Fear of Current or Ex-Partner: No    Emotionally Abused: No    Physically Abused: No    Sexually Abused:  No    Physical Exam      Future Appointments  Date Time Provider Department Center  12/21/2023 11:00 AM [facility] None  12/23/2023  1:00 PM Rickard Patience, MD [facility] None  12/23/2023  1:30 PM [facility] INFUSION CHAIR 4 [facility] None  12/24/2023  2:30 PM Delma Freeze, FNP [facility] None  09/15/2024  9:50 AM Drema Dallas, DO [facility] None       Kerry Hough, Paramedic [phone] [facility] Paramedic  11/19/23

## 2023-11-23 ENCOUNTER — Inpatient Hospital Stay: Payer: Medicare Other

## 2023-11-25 ENCOUNTER — Encounter: Payer: Self-pay | Admitting: Family

## 2023-11-25 ENCOUNTER — Other Ambulatory Visit
Admission: RE | Admit: 2023-11-25 | Discharge: 2023-11-25 | Disposition: A | Discharge status: Home / Self Care | Payer: Medicare Other | Attending: Family | Admitting: Family

## 2023-11-25 DIAGNOSIS — I5022 Chronic systolic (congestive) heart failure: Secondary | ICD-10-CM | POA: Diagnosis not present

## 2023-11-25 LAB — BASIC METABOLIC PANEL
Anion gap: 9 (ref 5–15)
BUN: 29 mg/dL — ABNORMAL HIGH (ref 6–20)
CO2: 18 mmol/L — ABNORMAL LOW (ref 22–32)
Calcium: 8.6 mg/dL — ABNORMAL LOW (ref 8.9–10.3)
Chloride: 108 mmol/L (ref 98–111)
Creatinine, Ser: 1.79 mg/dL — ABNORMAL HIGH (ref 0.44–1.00)
GFR, Estimated: 32 mL/min — ABNORMAL LOW (ref >60)
Glucose, Bld: 86 mg/dL (ref 70–99)
Potassium: 3.9 mmol/L (ref 3.5–5.1)
Sodium: 135 mmol/L (ref 135–145)

## 2023-11-26 ENCOUNTER — Inpatient Hospital Stay: Payer: Medicare Other | Admitting: Oncology

## 2023-11-26 ENCOUNTER — Inpatient Hospital Stay: Payer: Medicare Other

## 2023-11-26 ENCOUNTER — Other Ambulatory Visit (HOSPITAL_COMMUNITY): Payer: Self-pay

## 2023-11-26 NOTE — Progress Notes (Signed)
Paramedicine Encounter    Patient ID: Anita [name], female    DOB: [birth year], 62 y.o.   MRN: [id number]   Complaints-nothing new  Edema-none   Compliance with meds-yes  Pill box filled-yes  If so, by whom-daughter fills it   Refills needed-daughter handles it  Pt reports she is doing good. She denies any changes from last visit.  Labs from yesterday were normal. K was good and kidney function improved.  Using her inhaler BID more often, so it is helping. Still not sleeping well at night time. Her weight is stable. Daughter handles all meds.  Will f/u in a couple wks due to holiday schedule.     BP 108/60   Pulse (!) 54   Resp 16   Wt 149 lb (67.6 kg)   SpO2 99%   BMI 26.39 kg/m  Weight yesterday-148 Last visit weight-148   Patient Care Team: Doreene Nest, NP as PCP - General (Internal Medicine) Jodelle Red, MD as PCP - Cardiology (Cardiology) Rickard Patience, MD as Consulting Physician (Hematology and Oncology) Drema Dallas, DO as Consulting Physician (Neurology)  Patient Active Problem List   Diagnosis Date Noted   Cerumen impaction 09/16/2023   CKD (chronic kidney disease) stage 3, GFR 30-59 ml/min (HCC) 04/20/2023   Anemia due to stage 3a chronic kidney disease (HCC) 10/01/2022   Dilated cardiomyopathy (HCC) 09/19/2022   Cirrhosis of liver with ascites (HCC) 09/19/2022   Generalized weakness    Paroxysmal SVT (supraventricular tachycardia) (HCC) 09/16/2022   Stage 3b chronic kidney disease (HCC) 09/16/2022   HFrEF (heart failure with reduced ejection fraction) (HCC) 09/16/2022   Hypervolemia    Cardiorenal syndrome    Hypokalemia 08/27/2022   Transaminitis 08/23/2022   Elevated liver enzymes    Mitral valve insufficiency 05/19/2021   Hypoxia 05/19/2021   Anasarca 05/19/2021   Esophageal dysphagia 01/08/2021   Ischemic cardiomyopathy 10/08/2020   Coronary artery disease involving native coronary artery of native heart without angina  pectoris 10/08/2020   IDA (iron deficiency anemia) 10/05/2020   Vitamin B12 deficiency 08/23/2020   COPD (chronic obstructive pulmonary disease) (HCC) 06/22/2020   Tobacco abuse 03/22/2020   Pulmonary hypertension (HCC) 03/22/2020   Insomnia 03/22/2020   SIRS (systemic inflammatory response syndrome) (HCC) 03/20/2020   Normocytic anemia 03/20/2020   Gastroesophageal reflux disease 02/15/2018   Chronic migraine without aura with status migrainosus, not intractable 02/15/2018   Osteoarthritis 02/15/2018   Essential hypertension 02/15/2018   Disturbance of skin sensation 03/26/2012   Multiple sclerosis (HCC) 03/26/2012    Current Outpatient Medications:    amiodarone (PACERONE) 100 MG tablet, Take 1 tablet (100 mg total) by mouth daily., Disp: 90 tablet, Rfl: 3   aspirin EC 81 MG tablet, Take 1 tablet (81 mg total) by mouth daily. Swallow whole., Disp: 30 tablet, Rfl: 0   atorvastatin (LIPITOR) 20 MG tablet, Take 1 tablet daily, Disp: 90 tablet, Rfl: 3   dapagliflozin propanediol (FARXIGA) 10 MG TABS tablet, Take 1 tablet (10 mg total) by mouth daily before breakfast., Disp: 30 tablet, Rfl: 11   DULoxetine (CYMBALTA) 20 MG capsule, TAKE 1 CAPSULE (20 MG TOTAL) BY MOUTH DAILY. FOR ANXIETY AND PAIN., Disp: 90 capsule, Rfl: 0   feeding supplement, ENSURE ENLIVE, (ENSURE ENLIVE) LIQD, Take 237 mLs by mouth 2 (two) times daily between meals., Disp: , Rfl:    ferrous sulfate 325 (65 FE) MG EC tablet, Take 1 tablet (325 mg total) by mouth daily., Disp: 60 tablet, Rfl: 3  fluticasone-salmeterol (ADVAIR DISKUS) 250-50 MCG/ACT AEPB, INHALE 1 PUFF INTO THE LUNGS TWICE A DAY, Disp: 60 each, Rfl: 5   KLOR-CON M20 20 MEQ tablet, TAKE 2 TABLETS BY MOUTH 2 TIMES DAILY., Disp: 360 tablet, Rfl: 1   levothyroxine (SYNTHROID) 25 MCG tablet, TAKE 1 TABLET BY MOUTH DAILY BEFORE BREAKFAST., Disp: 90 tablet, Rfl: 1   metoprolol succinate (TOPROL-XL) 25 MG 24 hr tablet, TAKE 1 TABLET (25 MG TOTAL) BY MOUTH DAILY.,  Disp: 90 tablet, Rfl: 3   nitroGLYCERIN (NITROSTAT) 0.4 MG SL tablet, Place 1 tablet (0.4 mg total) under the tongue every 5 (five) minutes as needed for chest pain., Disp: 20 tablet, Rfl: 5   omeprazole (PRILOSEC) 40 MG capsule, TAKE 1 CAPSULE (40 MG TOTAL) BY MOUTH DAILY. FOR HEARTBURN. (Patient taking differently: Take 40 mg by mouth at bedtime. For heartburn.), Disp: 90 capsule, Rfl: 2   Pediatric Multiple Vitamins (FLINTSTONES MULTIVITAMIN) CHEW, Chew by mouth., Disp: , Rfl:    Probiotic Product (PROBIOTIC ADVANCED) CAPS, Take 1 capsule by mouth daily., Disp: , Rfl:    spironolactone (ALDACTONE) 25 MG tablet, Take 1 tablet (25 mg total) by mouth 2 (two) times daily., Disp: 180 tablet, Rfl: 3   torsemide (DEMADEX) 20 MG tablet, Take 3 tablets (60 mg total) by mouth 2 (two) times daily., Disp: 180 tablet, Rfl: 5   trolamine salicylate (ASPERCREME) 10 % cream, Apply 1 application  topically daily as needed for muscle pain (back or joint pain)., Disp: , Rfl:    vitamin B-12 (CYANOCOBALAMIN) 500 MCG tablet, Take 500 mcg by mouth 4 (four) times a week. 4 times weekly no more than 2000 MCG per week, Disp: , Rfl:   Current Facility-Administered Medications:    albumin human 25 % solution 25 g, 25 g, Intravenous, Once, Wyline Mood, MD Allergies  Allergen Reactions   Acthar Hp [Corticotropin] Other (See Comments)    Throat swelling and coughing up blood   Codeine    Prednisone       Social History   Socioeconomic History   Marital status: Divorced    Spouse name: Not on file   Number of children: 2   Years of education: Not on file   Highest education level: Some college, no degree  Occupational History   Not on file  Tobacco Use   Smoking status: Former    Current packs/day: 1.00    Types: Cigarettes   Smokeless tobacco: Never  Vaping Use   Vaping status: Never Used  Substance and Sexual Activity   Alcohol use: Not Currently    Comment: Occasional   Drug use: Never   Sexual  activity: Not on file  Other Topics Concern   Not on file  Social History Narrative   Right handed    Lives in one story home   Drinks Caffeine - Sweet Tea    Social Drivers of Health   Financial Resource Strain: Patient Declined (09/15/2023)   Overall Financial Resource Strain (CARDIA)    Difficulty of Paying Living Expenses: Patient declined  Food Insecurity: No Food Insecurity (09/15/2023)   Hunger Vital Sign    Worried About Running Out of Food in the Last Year: Never true    Ran Out of Food in the Last Year: Never true  Transportation Needs: No Transportation Needs (09/15/2023)   PRAPARE - Administrator, Civil Service (Medical): No    Lack of Transportation (Non-Medical): No  Physical Activity: Unknown (09/15/2023)   Exercise Vital Sign  Days of Exercise per Week: 0 days    Minutes of Exercise per Session: Not on file  Stress: No Stress Concern Present (09/15/2023)   [employer] of Occupational Health - Occupational Stress Questionnaire    Feeling of Stress : Not at all  Social Connections: Socially Isolated (09/15/2023)   Social Connection and Isolation Panel [NHANES]    Frequency of Communication with Friends and Family: Three times a week    Frequency of Social Gatherings with Friends and Family: More than three times a week    Attends Religious Services: Never    Database administrator or Organizations: No    Attends Banker Meetings: Not on file    Marital Status: Divorced  Intimate Partner Violence: Not At Risk (09/17/2022)   Humiliation, Afraid, Rape, and Kick questionnaire    Fear of Current or Ex-Partner: No    Emotionally Abused: No    Physically Abused: No    Sexually Abused: No    Physical Exam      Future Appointments  Date Time Provider Department Center  11/27/2023  1:30 PM [facility] ANNUAL WELLNESS VISIT 1 [facility]  12/21/2023 11:00 AM [facility] None  12/23/2023  1:00 PM Rickard Patience, MD [facility] None   12/23/2023  1:30 PM [facility] INFUSION CHAIR 5 [facility] None  12/24/2023  2:30 PM Delma Freeze, FNP [facility] None  09/15/2024  9:50 AM Drema Dallas, DO [facility] None       Kerry Hough, Paramedic [phone] [facility] Paramedic  11/26/23

## 2023-11-27 ENCOUNTER — Ambulatory Visit (INDEPENDENT_AMBULATORY_CARE_PROVIDER_SITE_OTHER): Payer: Medicare Other

## 2023-11-27 VITALS — Ht 63.0 in | Wt 149.0 lb

## 2023-11-27 DIAGNOSIS — Z0000 Encounter for general adult medical examination without abnormal findings: Secondary | ICD-10-CM | POA: Diagnosis not present

## 2023-11-27 NOTE — Patient Instructions (Signed)
Ms. Crofton , Thank you for taking time to come for your Medicare Wellness Visit. I appreciate your ongoing commitment to your health goals. Please review the following plan we discussed and let me know if I can assist you in the future.   Referrals/Orders/Follow-Ups/Clinician Recommendations: none  This is a list of the screening recommended for you and due dates:  Health Maintenance  Topic Date Due   Mammogram  02/18/2013   Pap with HPV screening  03/19/2021   Zoster (Shingles) Vaccine (1 of 2) 12/17/2023*   Flu Shot  03/07/2024*   Medicare Annual Wellness Visit  11/26/2024   Colon Cancer Screening  08/03/2025   Hepatitis C Screening  Completed   HIV Screening  Completed   HPV Vaccine  Aged Out   DTaP/Tdap/Td vaccine  Discontinued   COVID-19 Vaccine  Discontinued  *Topic was postponed. The date shown is not the original due date.    Advanced directives: (In Chart) A copy of your advanced directives are scanned into your chart should your provider ever need it.  Next Medicare Annual Wellness Visit scheduled for next year: Yes 11/29/2024 @1pm  telephone

## 2023-11-27 NOTE — Progress Notes (Signed)
Subjective:   Anita [name] is a 62 y.o. female who presents for Medicare Annual (Subsequent) preventive examination.  Visit Complete: Virtual I connected with Anita [name] pt HCPOA/caregiver and daughter regarding Anita [name] on 11/27/23 by a audio enabled telemedicine application and verified that I am speaking with the correct person using two identifiers.  Patient Location: Home  Provider Location: Office/Clinic  I discussed the limitations of evaluation and management by telemedicine. The patient expressed understanding and agreed to proceed.  Vital Signs: Because this visit was a virtual/telehealth visit, some criteria may be missing or patient reported. Any vitals not documented were not able to be obtained and vitals that have been documented are patient reported.  Patient Medicare AWV questionnaire was completed by the patient on (not done); I have confirmed that all information answered by patient is correct and no changes since this date.  Cardiac Risk Factors include: advanced age (>67men, >38 women);hypertension;sedentary lifestyle    Objective:    Today's Vitals   11/27/23 1259  Weight: 149 lb (67.6 kg)  Height: 5\' 3"  (1.6 m)   Body mass index is 26.39 kg/m.     11/27/2023    1:13 PM 09/14/2023    2:32 PM 05/15/2023    1:00 PM 04/20/2023    2:04 PM 03/18/2023   12:03 PM 10/01/2022    2:18 PM 09/17/2022   11:00 AM  Advanced Directives  Does Patient Have a Medical Advance Directive? Yes Yes Yes Yes Yes Yes Yes  Type of Estate agent of [place];Living will Healthcare Power of [place];Living will Healthcare Power of [employer] Power of [employer] Power of [place];Living will Healthcare Power of [employer] Power of Attorney  Does patient want to make changes to medical advance directive?   No - Patient declined    No - Patient declined  Copy of Healthcare Power of Attorney in Chart? Yes -  validated most recent copy scanned in chart (See row information)     Yes - validated most recent copy scanned in chart (See row information) Yes - validated most recent copy scanned in chart (See row information)    Current Medications (verified) Outpatient Encounter Medications as of 11/27/2023  Medication Sig   amiodarone (PACERONE) 100 MG tablet Take 1 tablet (100 mg total) by mouth daily.   aspirin EC 81 MG tablet Take 1 tablet (81 mg total) by mouth daily. Swallow whole.   atorvastatin (LIPITOR) 20 MG tablet Take 1 tablet daily   dapagliflozin propanediol (FARXIGA) 10 MG TABS tablet Take 1 tablet (10 mg total) by mouth daily before breakfast.   DULoxetine (CYMBALTA) 20 MG capsule TAKE 1 CAPSULE (20 MG TOTAL) BY MOUTH DAILY. FOR ANXIETY AND PAIN.   feeding supplement, ENSURE ENLIVE, (ENSURE ENLIVE) LIQD Take 237 mLs by mouth 2 (two) times daily between meals.   ferrous sulfate 325 (65 FE) MG EC tablet Take 1 tablet (325 mg total) by mouth daily.   fluticasone-salmeterol (ADVAIR DISKUS) 250-50 MCG/ACT AEPB INHALE 1 PUFF INTO THE LUNGS TWICE A DAY   KLOR-CON M20 20 MEQ tablet TAKE 2 TABLETS BY MOUTH 2 TIMES DAILY.   levothyroxine (SYNTHROID) 25 MCG tablet TAKE 1 TABLET BY MOUTH DAILY BEFORE BREAKFAST.   metoprolol succinate (TOPROL-XL) 25 MG 24 hr tablet TAKE 1 TABLET (25 MG TOTAL) BY MOUTH DAILY.   nitroGLYCERIN (NITROSTAT) 0.4 MG SL tablet Place 1 tablet (0.4 mg total) under the tongue every 5 (five) minutes as needed for chest pain.  omeprazole (PRILOSEC) 40 MG capsule TAKE 1 CAPSULE (40 MG TOTAL) BY MOUTH DAILY. FOR HEARTBURN. (Patient taking differently: Take 40 mg by mouth at bedtime. For heartburn.)   Pediatric Multiple Vitamins (FLINTSTONES MULTIVITAMIN) CHEW Chew by mouth.   Probiotic Product (PROBIOTIC ADVANCED) CAPS Take 1 capsule by mouth daily.   spironolactone (ALDACTONE) 25 MG tablet Take 1 tablet (25 mg total) by mouth 2 (two) times daily.   torsemide (DEMADEX) 20 MG tablet  Take 3 tablets (60 mg total) by mouth 2 (two) times daily.   trolamine salicylate (ASPERCREME) 10 % cream Apply 1 application  topically daily as needed for muscle pain (back or joint pain).   vitamin B-12 (CYANOCOBALAMIN) 500 MCG tablet Take 500 mcg by mouth 4 (four) times a week. 4 times weekly no more than 2000 MCG per week   Facility-Administered Encounter Medications as of 11/27/2023  Medication   albumin human 25 % solution 25 g    Allergies (verified) Acthar hp [corticotropin], Codeine, and Prednisone   History: Past Medical History:  Diagnosis Date   Abnormality of gait 07/26/2013   Acute kidney injury superimposed on stage IIIa chronic kidney disease (HCC) 03/20/2020   Acute on chronic combined systolic and diastolic CHF (congestive heart failure) (HCC) 10/08/2020   Acute on chronic HFrEF (heart failure with reduced ejection fraction) (HCC) 03/22/2020   Acute respiratory failure with hypoxia (HCC) 03/20/2020   Allergy    Arthritis    Atypical pneumonia 03/22/2020   CAD (coronary artery disease)    a. 03/2020 Cath: LM nl, LAD 60/50m, 55d, D1 70, D2 70, LCX 45ost/p, 65p, RCA 100ost CTO. RPDA fills via collats from dLAD. Inf septal fills via collats from 1st septal, RPAV fills via collats from LPAV, RPL1/2 sev dzs-->med rx.   Chickenpox    Chronic combined systolic (congestive) and diastolic (congestive) heart failure (HCC)    a. 02/2021 Echo: EF 40-45%, glob HK, gr2 DD. Sev red RV fxn, RVSP 69.66mmHg. Mod dil LA, sev dil RA. Sev MR. Mild to mod MS. Mean MV grad 6.19mmHg. Mod-Sev TR. Mild AI. Mild Ao sclerosis w/o stenosis.   COPD (chronic obstructive pulmonary disease) (HCC)    Elevated troponin 03/22/2020   GERD (gastroesophageal reflux disease)    Headache(784.0)    Migraine   Hearing loss    Right ear secondary to infection   History of shingles    Ischemic cardiomyopathy    a. 02/2021 Echo: EF 40-45%, glob HK.   Migraines    Multiple sclerosis (HCC)    Obese    Optic  neuritis    PAH (pulmonary artery hypertension) (HCC)    a. 02/2021 Echo: RVSP 69.36mmHg.   Prolonged QT interval 03/20/2020   Severe mitral regurgitation    a. 02/2021 Echo: Severe MR w/ mild to mod MS.  Mean MV grad 6.69mmHg.   Past Surgical History:  Procedure Laterality Date   COLONOSCOPY WITH PROPOFOL N/A 08/03/2020   Procedure: COLONOSCOPY WITH PROPOFOL;  Surgeon: Wyline Mood, MD;  Location: [facility] ENDOSCOPY;  Service: Gastroenterology;  Laterality: N/A;   Ganglioneuroma     Resection   LEFT HEART CATH AND CORONARY ANGIOGRAPHY N/A 03/23/2020   Procedure: LEFT HEART CATH AND CORONARY ANGIOGRAPHY;  Surgeon: Marykay Lex, MD;  Location: [facility] INVASIVE CV LAB;  Service: Cardiovascular;  Laterality: N/A;   PILONIDAL CYST EXCISION     PILONIDAL CYST EXCISION  1983   TEE WITHOUT CARDIOVERSION N/A 03/18/2023   Procedure: TRANSESOPHAGEAL ECHOCARDIOGRAM (TEE);  Surgeon: Laurey Morale,  MD;  Location: [facility] ORS;  Service: Cardiovascular;  Laterality: N/A;   TUMOR REMOVAL  2007/2008   Family History  Problem Relation Age of Onset   Skin cancer Mother    Lung cancer Father    Uterine cancer Sister    Heart disease Brother    Bipolar disorder Sister    Throat cancer Paternal Grandmother    Social History   Socioeconomic History   Marital status: Divorced    Spouse name: Not on file   Number of children: 2   Years of education: Not on file   Highest education level: Some college, no degree  Occupational History   Not on file  Tobacco Use   Smoking status: Former    Current packs/day: 1.00    Types: Cigarettes   Smokeless tobacco: Never  Vaping Use   Vaping status: Never Used  Substance and Sexual Activity   Alcohol use: Not Currently    Comment: Occasional   Drug use: Never   Sexual activity: Not on file  Other Topics Concern   Not on file  Social History Narrative   Right handed    Lives in one story home   Drinks Caffeine - Sweet Tea    Social Drivers of Health    Financial Resource Strain: Low Risk  (11/27/2023)   Overall Financial Resource Strain (CARDIA)    Difficulty of Paying Living Expenses: Not hard at all  Food Insecurity: No Food Insecurity (11/27/2023)   Hunger Vital Sign    Worried About Running Out of Food in the Last Year: Never true    Ran Out of Food in the Last Year: Never true  Transportation Needs: No Transportation Needs (11/27/2023)   PRAPARE - Administrator, Civil Service (Medical): No    Lack of Transportation (Non-Medical): No  Physical Activity: Inactive (11/27/2023)   Exercise Vital Sign    Days of Exercise per Week: 0 days    Minutes of Exercise per Session: 0 min  Stress: No Stress Concern Present (11/27/2023)   [employer] of Occupational Health - Occupational Stress Questionnaire    Feeling of Stress : Only a little  Social Connections: Socially Isolated (11/27/2023)   Social Connection and Isolation Panel [NHANES]    Frequency of Communication with Friends and Family: Twice a week    Frequency of Social Gatherings with Friends and Family: Three times a week    Attends Religious Services: Never    Active Member of Clubs or Organizations: No    Attends Banker Meetings: Never    Marital Status: Divorced    Tobacco Counseling Counseling given: Not Answered  Clinical Intake:  Pre-visit preparation completed: Yes  Pain : No/denies pain per daughter    BMI - recorded: 26.39 Nutritional Status: BMI 25 -29 Overweight Nutritional Risks: None Diabetes: No  How often do you need to have someone help you when you read instructions, pamphlets, or other written materials from your doctor or pharmacy?: 4 - Often  Interpreter Needed?: No  Comments: lives with daughter Information entered by :: B.Lilley Hubble,LPN   Activities of Daily Living    11/27/2023    1:13 PM 03/18/2023   11:57 AM  In your present state of health, do you have any difficulty performing the following  activities:  Hearing? 1 0  Vision? 1 1  Comment  optic neuritis  Difficulty concentrating or making decisions? 1 0  Walking or climbing stairs? 1 1  Dressing or bathing? 1 1  Comment  Just bathing  Doing errands, shopping? 1   Preparing Food and eating ? Y   Using the Toilet? N   In the past six months, have you accidently leaked urine? Y   Do you have problems with loss of bowel control? N   Managing your Medications? Y   Managing your Finances? Y   Housekeeping or managing your Housekeeping? Y     Patient Care Team: Doreene Nest, NP as PCP - General (Internal Medicine) Jodelle Red, MD as PCP - Cardiology (Cardiology) Rickard Patience, MD as Consulting Physician (Hematology and Oncology) Drema Dallas, DO as Consulting Physician (Neurology)  Indicate any recent Medical Services you may have received from other than [facility] providers in the past year (date may be approximate).     Assessment:   This is a routine wellness examination for Sanika.  Hearing/Vision screen Hearing Screening - Comments:: Arline Asp says pt does not hear well Vision Screening - Comments:: Pt daughter vision not good w/glasses [facility]   Goals Addressed   None    Depression Screen    11/27/2023    1:10 PM 09/16/2023    7:27 AM 07/08/2022    9:37 AM 06/18/2021    1:54 PM  PHQ 2/9 Scores  PHQ - 2 Score 0 0 1 0  PHQ- 9 Score   6     Fall Risk    11/27/2023    1:07 PM 09/16/2023    7:27 AM 09/14/2023    2:32 PM 09/08/2022    2:42 PM 07/08/2022    9:41 AM  Fall Risk   Falls in the past year? 0 0 0 0 0  Number falls in past yr: 0 0 0 0 0  Injury with Fall? 0 0 0 0 0  Risk for fall due to : No Fall Risks No Fall Risks  Impaired mobility   Follow up Falls prevention discussed;Education provided Falls evaluation completed Falls evaluation completed Falls evaluation completed Falls evaluation completed;Education provided;Falls prevention discussed    MEDICARE RISK AT HOME: Medicare  Risk at Home Any stairs in or around the home?: No If so, are there any without handrails?: No Home free of loose throw rugs in walkways, pet beds, electrical cords, etc?: Yes Adequate lighting in your home to reduce risk of falls?: Yes Life alert?: No Use of a cane, walker or w/c?: Yes Grab bars in the bathroom?: Yes Shower chair or bench in shower?: Yes Elevated toilet seat or a handicapped toilet?: Yes  TIMED UP AND GO:  Was the test performed?  No    Cognitive Function:    11/27/2023    1:17 PM  MMSE - Mini Mental State Exam  Not completed: Unable to complete        07/08/2022    9:33 AM  6CIT Screen  What Year? 0 points  What month? 0 points  What time? 0 points  Count back from 20 0 points  Months in reverse 0 points  Repeat phrase 0 points  Total Score 0 points    Immunizations  There is no immunization history on file for this patient.  TDAP status: Due, Education has been provided regarding the importance of this vaccine. Advised may receive this vaccine at local pharmacy or Health Dept. Aware to provide a copy of the vaccination record if obtained from local pharmacy or Health Dept. Verbalized acceptance and understanding.  Flu Vaccine status: Declined, Education has been provided regarding the importance of this  vaccine but patient still declined. Advised may receive this vaccine at local pharmacy or Health Dept. Aware to provide a copy of the vaccination record if obtained from local pharmacy or Health Dept. Verbalized acceptance and understanding.  Pneumococcal vaccine status: Declined,  Education has been provided regarding the importance of this vaccine but patient still declined. Advised may receive this vaccine at local pharmacy or Health Dept. Aware to provide a copy of the vaccination record if obtained from local pharmacy or Health Dept. Verbalized acceptance and understanding.   Covid-19 vaccine status: Declined, Education has been provided regarding  the importance of this vaccine but patient still declined. Advised may receive this vaccine at local pharmacy or Health Dept.or vaccine clinic. Aware to provide a copy of the vaccination record if obtained from local pharmacy or Health Dept. Verbalized acceptance and understanding.  Qualifies for Shingles Vaccine? Yes   Zostavax completed No   Shingrix Completed?: No.    Education has been provided regarding the importance of this vaccine. Patient has been advised to call insurance company to determine out of pocket expense if they have not yet received this vaccine. Advised may also receive vaccine at local pharmacy or Health Dept. Verbalized acceptance and understanding.  Screening Tests Health Maintenance  Topic Date Due   Zoster Vaccines- Shingrix (1 of 2) 12/17/2023 (Originally 02/18/1980)   INFLUENZA VACCINE  03/07/2024 (Originally 07/09/2023)   Cervical Cancer Screening (HPV/Pap Cotest)  11/26/2024 (Originally 03/19/2021)   MAMMOGRAM  11/26/2024 (Originally 02/18/2013)   Medicare Annual Wellness (AWV)  11/26/2024   Colonoscopy  08/03/2025   Hepatitis C Screening  Completed   HIV Screening  Completed   HPV VACCINES  Aged Out   DTaP/Tdap/Td  Discontinued   COVID-19 Vaccine  Discontinued    Health Maintenance  There are no preventive care reminders to display for this patient.   Colorectal cancer screening: Type of screening: Colonoscopy. Completed 8/27/201. Repeat every 5 years Daughter says pt refuses to do this screening again  Mammogram status: Completed no. Repeat every year ordered already  Lung Cancer Screening: (Low Dose CT Chest recommended if Age 17-80 years, 20 pack-year currently smoking OR have quit w/in 15years.) does not qualify.   Lung Cancer Screening Referral: no  Additional Screening:  Hepatitis C Screening: does not qualify; Completed 12/25/2022  Vision Screening: Recommended annual ophthalmology exams for early detection of glaucoma and other disorders of the  eye. Is the patient up to date with their annual eye exam?  No  Who is the provider or what is the name of the office in which the patient attends annual eye exams? none If pt is not established with a provider, would they like to be referred to a provider to establish care? No . Daughter says no tx will improve vision.does not desire visit  Dental Screening: Recommended annual dental exams for proper oral hygiene  Diabetic Foot Exam: n/a  Community Resource Referral / Chronic Care Management: CRR required this visit?  No   CCM required this visit?  No    Plan:     I have personally reviewed and noted the following in the patient's chart:   Medical and social history Use of alcohol, tobacco or illicit drugs  Current medications and supplements including opioid prescriptions. Patient is not currently taking opioid prescriptions. Functional ability and status Nutritional status Physical activity Advanced directives List of other physicians Hospitalizations, surgeries, and ER visits in previous 12 months Vitals Screenings to include cognitive, depression, and falls Referrals and  appointments  In addition, I have reviewed and discussed with patient certain preventive protocols, quality metrics, and best practice recommendations. A written personalized care plan for preventive services as well as general preventive health recommendations were provided to patient.    Sue Lush, LPN   04/54/[id number]   After Visit Summary: (MyChart) Due to this being a telephonic visit, the after visit summary with patients personalized plan was offered to patient via MyChart   Nurse Notes: I spoke with pt's daughter/HCPOA and caretaker, Aram Beecham, who relays pt lives with her and pt MS sx are worsening. She has no concerns or questions at this time.

## 2023-12-07 ENCOUNTER — Other Ambulatory Visit: Payer: Self-pay | Admitting: Primary Care

## 2023-12-07 DIAGNOSIS — M150 Primary generalized (osteo)arthritis: Secondary | ICD-10-CM

## 2023-12-10 ENCOUNTER — Encounter: Payer: Self-pay | Admitting: Family

## 2023-12-10 ENCOUNTER — Other Ambulatory Visit: Payer: Self-pay

## 2023-12-10 DIAGNOSIS — I5022 Chronic systolic (congestive) heart failure: Secondary | ICD-10-CM

## 2023-12-10 MED ORDER — ALBUMIN HUMAN 25 % IV SOLN: 25.0000 g | Freq: Once | INTRAVENOUS | 0 refills | Status: AC

## 2023-12-11 ENCOUNTER — Other Ambulatory Visit: Payer: Self-pay

## 2023-12-11 MED ORDER — ALBUMIN HUMAN 25 % IV SOLN
25.0000 g | Freq: Once | INTRAVENOUS | Status: AC
Start: 2023-12-11 — End: 2023-12-11

## 2023-12-14 ENCOUNTER — Ambulatory Visit
Admission: RE | Admit: 2023-12-14 | Discharge: 2023-12-14 | Disposition: A | Discharge status: Home / Self Care | Payer: Medicare Other | Source: Ambulatory Visit | Attending: Family | Admitting: Family

## 2023-12-14 ENCOUNTER — Other Ambulatory Visit: Payer: Self-pay

## 2023-12-14 DIAGNOSIS — I5022 Chronic systolic (congestive) heart failure: Secondary | ICD-10-CM

## 2023-12-14 DIAGNOSIS — R188 Other ascites: Secondary | ICD-10-CM

## 2023-12-14 DIAGNOSIS — I509 Heart failure, unspecified: Secondary | ICD-10-CM | POA: Diagnosis not present

## 2023-12-14 MED ORDER — LIDOCAINE HCL (PF) 1 % IJ SOLN
10.0000 mL | Freq: Once | INTRAMUSCULAR | Status: AC
  Administered 2023-12-14: 10 mL via INTRADERMAL
  Filled 2023-12-14: qty 10

## 2023-12-14 MED ORDER — ALBUMIN HUMAN 25 % IV SOLN
25.0000 g | Freq: Once | INTRAVENOUS | Status: AC
  Filled 2023-12-14: qty 100

## 2023-12-14 NOTE — Procedures (Signed)
 PROCEDURE SUMMARY:  Successful US  guided paracentesis from the right lower abdomen.  Yielded 6350 cc of clear yellow fluid.  No immediate complications.  Pt tolerated well.   No labs requested.   EBL < 5mL  Tameka Hoiland N Dellar Traber PA-C 12/14/2023 2:53 PM

## 2023-12-16 ENCOUNTER — Other Ambulatory Visit (HOSPITAL_COMMUNITY): Payer: Self-pay

## 2023-12-16 NOTE — Progress Notes (Signed)
 Paramedicine Encounter    Patient ID: Anita [name], female    DOB: 1961/07/18, 63 y.o.   MRN: [id number]   Complaints-much better   Edema-none   Compliance with meds-yes   Pill box filled-pts daughter filled it  If so, by whom-daughter   Refills needed-none      Pt reports she is doing much better after getting the fluid drained off of her 2 days ago. Her weights had gotten up to 150's and felt very bloated. Some of the time adjusting her diuretics  work but most of time it doesn't. So daughter called in to the heart clinic to get it set up She feels like her stomach is settling back down.  Her med list shows 40meq K BID but it should be 20meq BID.  Appetite is better and its easier for her to eat with the fluid off of her.  Pt denies c/p, no increased sob, her dizziness is staying the same.  No issues or drainage from the site where they drew the fluid off.  She has multiple appointments next week including at heart clinic, so I will f/u week after that.  Daughter is doing a good job managing all the things--meds, appointments etc.  BP 108/60   Pulse (!) 56   Resp 18   Wt 141 lb (64 kg)   SpO2 99%   BMI 24.98 kg/m  Weight yesterday-141 Last visit weight-149  Patient Care Team: Gretta Comer POUR, NP as PCP - General (Internal Medicine) Lonni Slain, MD as PCP - Cardiology (Cardiology) Babara Call, MD as Consulting Physician (Hematology and Oncology) Skeet Juliene SAUNDERS, DO as Consulting Physician (Neurology)  Patient Active Problem List   Diagnosis Date Noted   Cerumen impaction 09/16/2023   CKD (chronic kidney disease) stage 3, GFR 30-59 ml/min (HCC) 04/20/2023   Anemia due to stage 3a chronic kidney disease (HCC) 10/01/2022   Dilated cardiomyopathy (HCC) 09/19/2022   Cirrhosis of liver with ascites (HCC) 09/19/2022   Generalized weakness    Paroxysmal SVT (supraventricular tachycardia) (HCC) 09/16/2022   Stage 3b chronic kidney disease (HCC) 09/16/2022    HFrEF (heart failure with reduced ejection fraction) (HCC) 09/16/2022   Hypervolemia    Cardiorenal syndrome    Hypokalemia 08/27/2022   Transaminitis 08/23/2022   Elevated liver enzymes    Mitral valve insufficiency 05/19/2021   Hypoxia 05/19/2021   Anasarca 05/19/2021   Esophageal dysphagia 01/08/2021   Ischemic cardiomyopathy 10/08/2020   Coronary artery disease involving native coronary artery of native heart without angina pectoris 10/08/2020   IDA (iron  deficiency anemia) 10/05/2020   Vitamin B12 deficiency 08/23/2020   COPD (chronic obstructive pulmonary disease) (HCC) 06/22/2020   Tobacco abuse 03/22/2020   Pulmonary hypertension (HCC) 03/22/2020   Insomnia 03/22/2020   SIRS (systemic inflammatory response syndrome) (HCC) 03/20/2020   Normocytic anemia 03/20/2020   Gastroesophageal reflux disease 02/15/2018   Chronic migraine without aura with status migrainosus, not intractable 02/15/2018   Osteoarthritis 02/15/2018   Essential hypertension 02/15/2018   Disturbance of skin sensation 03/26/2012   Multiple sclerosis (HCC) 03/26/2012    Current Outpatient Medications:    amiodarone  (PACERONE ) 100 MG tablet, Take 1 tablet (100 mg total) by mouth daily., Disp: 90 tablet, Rfl: 3   aspirin  EC 81 MG tablet, Take 1 tablet (81 mg total) by mouth daily. Swallow whole., Disp: 30 tablet, Rfl: 0   atorvastatin  (LIPITOR) 20 MG tablet, Take 1 tablet daily, Disp: 90 tablet, Rfl: 3   dapagliflozin  propanediol (FARXIGA ) 10  MG TABS tablet, Take 1 tablet (10 mg total) by mouth daily before breakfast., Disp: 30 tablet, Rfl: 11   DULoxetine  (CYMBALTA ) 20 MG capsule, TAKE 1 CAPSULE (20 MG TOTAL) BY MOUTH DAILY. FOR ANXIETY AND PAIN., Disp: 90 capsule, Rfl: 2   feeding supplement, ENSURE ENLIVE, (ENSURE ENLIVE) LIQD, Take 237 mLs by mouth 2 (two) times daily between meals., Disp: , Rfl:    ferrous sulfate  325 (65 FE) MG EC tablet, Take 1 tablet (325 mg total) by mouth daily., Disp: 60 tablet,  Rfl: 3   fluticasone -salmeterol (ADVAIR DISKUS) 250-50 MCG/ACT AEPB, INHALE 1 PUFF INTO THE LUNGS TWICE A DAY, Disp: 60 each, Rfl: 5   KLOR-CON  M20 20 MEQ tablet, TAKE 2 TABLETS BY MOUTH 2 TIMES DAILY. (Patient taking differently: Take 20 mEq by mouth 2 (two) times daily.), Disp: 360 tablet, Rfl: 1   levothyroxine  (SYNTHROID ) 25 MCG tablet, TAKE 1 TABLET BY MOUTH DAILY BEFORE BREAKFAST., Disp: 90 tablet, Rfl: 1   metoprolol  succinate (TOPROL -XL) 25 MG 24 hr tablet, TAKE 1 TABLET (25 MG TOTAL) BY MOUTH DAILY., Disp: 90 tablet, Rfl: 3   omeprazole  (PRILOSEC) 40 MG capsule, TAKE 1 CAPSULE (40 MG TOTAL) BY MOUTH DAILY. FOR HEARTBURN. (Patient taking differently: Take 40 mg by mouth at bedtime. For heartburn.), Disp: 90 capsule, Rfl: 2   Pediatric Multiple Vitamins (FLINTSTONES MULTIVITAMIN) CHEW, Chew by mouth., Disp: , Rfl:    Probiotic Product (PROBIOTIC ADVANCED) CAPS, Take 1 capsule by mouth daily., Disp: , Rfl:    spironolactone  (ALDACTONE ) 25 MG tablet, Take 1 tablet (25 mg total) by mouth 2 (two) times daily., Disp: 180 tablet, Rfl: 3   torsemide  (DEMADEX ) 20 MG tablet, Take 3 tablets (60 mg total) by mouth 2 (two) times daily., Disp: 180 tablet, Rfl: 5   trolamine salicylate (ASPERCREME) 10 % cream, Apply 1 application  topically daily as needed for muscle pain (back or joint pain)., Disp: , Rfl:    vitamin B-12 (CYANOCOBALAMIN ) 500 MCG tablet, Take 500 mcg by mouth 4 (four) times a week. 4 times weekly no more than 2000 MCG per week, Disp: , Rfl:    nitroGLYCERIN  (NITROSTAT ) 0.4 MG SL tablet, Place 1 tablet (0.4 mg total) under the tongue every 5 (five) minutes as needed for chest pain., Disp: 20 tablet, Rfl: 5  Current Facility-Administered Medications:    albumin  human 25 % solution 25 g, 25 g, Intravenous, Once, Hackney, Tina A, FNP Allergies  Allergen Reactions   Acthar  Hp [Corticotropin ] Other (See Comments)    Throat swelling and coughing up blood   Codeine    Prednisone        Social History   Socioeconomic History   Marital status: Divorced    Spouse name: Not on file   Number of children: 2   Years of education: Not on file   Highest education level: Some college, no degree  Occupational History   Not on file  Tobacco Use   Smoking status: Former    Current packs/day: 1.00    Types: Cigarettes   Smokeless tobacco: Never  Vaping Use   Vaping status: Never Used  Substance and Sexual Activity   Alcohol use: Not Currently    Comment: Occasional   Drug use: Never   Sexual activity: Not on file  Other Topics Concern   Not on file  Social History Narrative   Right handed    Lives in one story home   Drinks Caffeine - Sweet Tea    Social  Drivers of Health   Financial Resource Strain: Low Risk  (11/27/2023)   Overall Financial Resource Strain (CARDIA)    Difficulty of Paying Living Expenses: Not hard at all  Food Insecurity: No Food Insecurity (11/27/2023)   Hunger Vital Sign    Worried About Running Out of Food in the Last Year: Never true    Ran Out of Food in the Last Year: Never true  Transportation Needs: No Transportation Needs (11/27/2023)   PRAPARE - Administrator, Civil Service (Medical): No    Lack of Transportation (Non-Medical): No  Physical Activity: Inactive (11/27/2023)   Exercise Vital Sign    Days of Exercise per Week: 0 days    Minutes of Exercise per Session: 0 min  Stress: No Stress Concern Present (11/27/2023)   [employer] of Occupational Health - Occupational Stress Questionnaire    Feeling of Stress : Only a little  Social Connections: Socially Isolated (11/27/2023)   Social Connection and Isolation Panel [NHANES]    Frequency of Communication with Friends and Family: Twice a week    Frequency of Social Gatherings with Friends and Family: Three times a week    Attends Religious Services: Never    Active Member of Clubs or Organizations: No    Attends Banker Meetings: Never     Marital Status: Divorced  Catering Manager Violence: Not At Risk (11/27/2023)   Humiliation, Afraid, Rape, and Kick questionnaire    Fear of Current or Ex-Partner: No    Emotionally Abused: No    Physically Abused: No    Sexually Abused: No    Physical Exam      Future Appointments  Date Time Provider Department Center  12/21/2023 11:00 AM [facility] None  12/23/2023  1:00 PM Babara Call, MD [facility] None  12/23/2023  1:30 PM [facility] INFUSION CHAIR 7 [facility] None  12/24/2023  2:30 PM Donette Ellouise LABOR, FNP [facility] None  09/15/2024  9:50 AM Skeet Juliene SAUNDERS, DO [facility] None  11/29/2024  1:00 PM [facility] ANNUAL WELLNESS VISIT 1 [facility] P[facility]       Izetta Quivers, Paramedic [phone] [facility] Paramedic  12/16/23

## 2023-12-21 ENCOUNTER — Inpatient Hospital Stay: Payer: Medicare Other | Attending: Oncology

## 2023-12-21 DIAGNOSIS — Z888 Allergy status to other drugs, medicaments and biological substances status: Secondary | ICD-10-CM | POA: Diagnosis not present

## 2023-12-21 DIAGNOSIS — I252 Old myocardial infarction: Secondary | ICD-10-CM | POA: Insufficient documentation

## 2023-12-21 DIAGNOSIS — R110 Nausea: Secondary | ICD-10-CM | POA: Diagnosis not present

## 2023-12-21 DIAGNOSIS — K648 Other hemorrhoids: Secondary | ICD-10-CM | POA: Diagnosis not present

## 2023-12-21 DIAGNOSIS — I5042 Chronic combined systolic (congestive) and diastolic (congestive) heart failure: Secondary | ICD-10-CM | POA: Insufficient documentation

## 2023-12-21 DIAGNOSIS — Z87891 Personal history of nicotine dependence: Secondary | ICD-10-CM | POA: Insufficient documentation

## 2023-12-21 DIAGNOSIS — R5382 Chronic fatigue, unspecified: Secondary | ICD-10-CM | POA: Insufficient documentation

## 2023-12-21 DIAGNOSIS — D631 Anemia in chronic kidney disease: Secondary | ICD-10-CM | POA: Diagnosis not present

## 2023-12-21 DIAGNOSIS — Z8249 Family history of ischemic heart disease and other diseases of the circulatory system: Secondary | ICD-10-CM | POA: Insufficient documentation

## 2023-12-21 DIAGNOSIS — Z885 Allergy status to narcotic agent status: Secondary | ICD-10-CM | POA: Insufficient documentation

## 2023-12-21 DIAGNOSIS — I2510 Atherosclerotic heart disease of native coronary artery without angina pectoris: Secondary | ICD-10-CM | POA: Diagnosis not present

## 2023-12-21 DIAGNOSIS — Z79899 Other long term (current) drug therapy: Secondary | ICD-10-CM | POA: Diagnosis not present

## 2023-12-21 DIAGNOSIS — N1831 Chronic kidney disease, stage 3a: Secondary | ICD-10-CM | POA: Insufficient documentation

## 2023-12-21 DIAGNOSIS — Z818 Family history of other mental and behavioral disorders: Secondary | ICD-10-CM | POA: Insufficient documentation

## 2023-12-21 DIAGNOSIS — R188 Other ascites: Secondary | ICD-10-CM | POA: Diagnosis not present

## 2023-12-21 DIAGNOSIS — R1909 Other intra-abdominal and pelvic swelling, mass and lump: Secondary | ICD-10-CM | POA: Diagnosis not present

## 2023-12-21 DIAGNOSIS — E538 Deficiency of other specified B group vitamins: Secondary | ICD-10-CM | POA: Insufficient documentation

## 2023-12-21 DIAGNOSIS — J449 Chronic obstructive pulmonary disease, unspecified: Secondary | ICD-10-CM | POA: Diagnosis not present

## 2023-12-21 DIAGNOSIS — Z808 Family history of malignant neoplasm of other organs or systems: Secondary | ICD-10-CM | POA: Insufficient documentation

## 2023-12-21 DIAGNOSIS — Z8619 Personal history of other infectious and parasitic diseases: Secondary | ICD-10-CM | POA: Diagnosis not present

## 2023-12-21 DIAGNOSIS — I2721 Secondary pulmonary arterial hypertension: Secondary | ICD-10-CM | POA: Diagnosis not present

## 2023-12-21 DIAGNOSIS — Z801 Family history of malignant neoplasm of trachea, bronchus and lung: Secondary | ICD-10-CM | POA: Insufficient documentation

## 2023-12-21 DIAGNOSIS — I255 Ischemic cardiomyopathy: Secondary | ICD-10-CM | POA: Insufficient documentation

## 2023-12-21 DIAGNOSIS — Z8049 Family history of malignant neoplasm of other genital organs: Secondary | ICD-10-CM | POA: Insufficient documentation

## 2023-12-21 DIAGNOSIS — K219 Gastro-esophageal reflux disease without esophagitis: Secondary | ICD-10-CM | POA: Diagnosis not present

## 2023-12-21 LAB — CBC WITH DIFFERENTIAL (CANCER CENTER ONLY)
Abs Immature Granulocytes: 0.05 10*3/uL (ref 0.00–0.07)
Basophils Absolute: 0.1 10*3/uL (ref 0.0–0.1)
Basophils Relative: 1 %
Eosinophils Absolute: 0.1 10*3/uL (ref 0.0–0.5)
Eosinophils Relative: 3 %
HCT: 43.3 % (ref 36.0–46.0)
Hemoglobin: 13.1 g/dL (ref 12.0–15.0)
Immature Granulocytes: 1 %
Lymphocytes Relative: 11 %
Lymphs Abs: 0.5 10*3/uL — ABNORMAL LOW (ref 0.7–4.0)
MCH: 29.9 pg (ref 26.0–34.0)
MCHC: 30.3 g/dL (ref 30.0–36.0)
MCV: 98.9 fL (ref 80.0–100.0)
Monocytes Absolute: 0.5 10*3/uL (ref 0.1–1.0)
Monocytes Relative: 11 %
Neutro Abs: 3.1 10*3/uL (ref 1.7–7.7)
Neutrophils Relative %: 73 %
Platelet Count: 174 10*3/uL (ref 150–400)
RBC: 4.38 MIL/uL (ref 3.87–5.11)
RDW: 17.6 % — ABNORMAL HIGH (ref 11.5–15.5)
WBC Count: 4.3 10*3/uL (ref 4.0–10.5)
nRBC: 0 % (ref 0.0–0.2)

## 2023-12-21 LAB — IRON AND TIBC
Iron: 57 ug/dL (ref 28–170)
Saturation Ratios: 15 % (ref 10.4–31.8)
TIBC: 378 ug/dL (ref 250–450)
UIBC: 321 ug/dL

## 2023-12-21 LAB — RETIC PANEL
Immature Retic Fract: 20.1 % — ABNORMAL HIGH (ref 2.3–15.9)
RBC.: 4.29 MIL/uL (ref 3.87–5.11)
Retic Count, Absolute: 90.1 10*3/uL (ref 19.0–186.0)
Retic Ct Pct: 2.1 % (ref 0.4–3.1)
Reticulocyte Hemoglobin: 31.5 pg (ref >27.9)

## 2023-12-21 LAB — FERRITIN: Ferritin: 102 ng/mL (ref 11–307)

## 2023-12-22 NOTE — Progress Notes (Signed)
PCP: Doreene Nest, NP (last seen 10/24) Primary Cardiologist: Jodelle Red, MD (last seen 10/23) HF cardiologist: Marca Ancona, MD (last seen 05/24)  Chief Complaint: fatigue  HPI:  Anita [name] is a 63 y/o female with a history of CAD (occluded RCA with collaterals), SVT, COPD, GERD, multiple sclerosis, pulmonary HTN, cirrhosis likely secondary to RV failure, severe valvular disease, previous tobacco use, congestive hepatopathy and chronic heart failure.   Admitted 09/16/22 due to SVT. Cardiology consult obtained. Given IV adenosine and then amiodarone drip. Lasix gtt. Plan for EP consult. Discharged after 3 days.   Echo 03/20/20: EF 40-45% with moderately elevated PA pressure, mild LAE, moderate/ severe MR Echo 02/07/21: EF of 40-45% along with severely elevated PA pressure of 69.2 mmHg, moderate LAE, severe MR, mild/ moderate Anita and moderate/severe TR.  Echo 08/22/22: EF of 30-35% along with mild LVH and severe MR/TR.  TEE 03/18/23: EF 40% with moderate LAE/ severe RAE and severe MR with restricted posterior leaflet, peak RV-RA gradient 48 mmHg, severe TR. No PFO or ASD.   LHC done 03/23/20 showed: Hemodynamics: LV end diastolic pressure is moderately elevated. ----Coronary Angiography----- Ost RCA to Dist RCA lesion is 100% stenosed.-The PDA and PL system fills via faint collaterals from the AV groove LCx and LAD septals. Mid LAD-1 lesion is 60% stenosed. Mid LAD-2 lesion is 50% stenosed. Dist LAD lesion is 55% stenosed with 70% stenosed side branch in 2nd Diag. 1st Diag lesion is 70% stenosed. Ost Cx to Prox Cx lesion is 45% stenosed. Prox-MID Cx lesion is 65% stenosed.  MODERATE-SEVERE THREE-VESSEL CAD: 100% proximal RCA occlusion with left-to-right collaterals faintly filling PDA and PL system (AV groove LCx-PL and LAD septal-PDA) Diffuse moderate mid LAD disease with 60% to 50% stenosis. Tandem 50% and 65% proximal and mid LCx with the 65% lesion being the most  significant lesion. Moderately elevated LVEDP of 18-20 mmHg  She presents today for a HF follow-up visit with a chief complaint of moderate fatigue with little exertion. Has associated shortness of breath, occasional chest pain and chronic dizziness along with this. Denies palpitations, cough, abdominal distention, pedal edema or weight gain. Does endorse sleeping more during the day and feels like her days/ nights are mixed up.   She has cirrhosis likely secondary to RV failure. She had most recent paracenteses 12/14/23 with removal of 6.3 L. Since the paracentesis, her abdomen is now soft and she's not having any more abdominal pain. Prior to the paracentesis, her abdomen became distended and painful.   ROS: All systems negative except as listed in HPI, PMH and Problem List.  SH:  Social History   Socioeconomic History   Marital status: Divorced    Spouse name: Not on file   Number of children: 2   Years of education: Not on file   Highest education level: Some college, no degree  Occupational History   Not on file  Tobacco Use   Smoking status: Former    Current packs/day: 1.00    Types: Cigarettes   Smokeless tobacco: Never  Vaping Use   Vaping status: Never Used  Substance and Sexual Activity   Alcohol use: Not Currently    Comment: Occasional   Drug use: Never   Sexual activity: Not on file  Other Topics Concern   Not on file  Social History Narrative   Right handed    Lives in one story home   Drinks Caffeine - Sweet Tea    Social Drivers of Health  Financial Resource Strain: Low Risk  (11/27/2023)   Overall Financial Resource Strain (CARDIA)    Difficulty of Paying Living Expenses: Not hard at all  Food Insecurity: No Food Insecurity (11/27/2023)   Hunger Vital Sign    Worried About Running Out of Food in the Last Year: Never true    Ran Out of Food in the Last Year: Never true  Transportation Needs: No Transportation Needs (11/27/2023)   PRAPARE -  Administrator, Civil Service (Medical): No    Lack of Transportation (Non-Medical): No  Physical Activity: Inactive (11/27/2023)   Exercise Vital Sign    Days of Exercise per Week: 0 days    Minutes of Exercise per Session: 0 min  Stress: No Stress Concern Present (11/27/2023)   [employer] of Occupational Health - Occupational Stress Questionnaire    Feeling of Stress : Only a little  Social Connections: Socially Isolated (11/27/2023)   Social Connection and Isolation Panel [NHANES]    Frequency of Communication with Friends and Family: Twice a week    Frequency of Social Gatherings with Friends and Family: Three times a week    Attends Religious Services: Never    Active Member of Clubs or Organizations: No    Attends Banker Meetings: Never    Marital Status: Divorced  Catering manager Violence: Not At Risk (11/27/2023)   Humiliation, Afraid, Rape, and Kick questionnaire    Fear of Current or Ex-Partner: No    Emotionally Abused: No    Physically Abused: No    Sexually Abused: No    FH:  Family History  Problem Relation Age of Onset   Skin cancer Mother    Lung cancer Father    Uterine cancer Sister    Heart disease Brother    Bipolar disorder Sister    Throat cancer Paternal Grandmother     Past Medical History:  Diagnosis Date   Abnormality of gait 07/26/2013   Acute kidney injury superimposed on stage IIIa chronic kidney disease (HCC) 03/20/2020   Acute on chronic combined systolic and diastolic CHF (congestive heart failure) (HCC) 10/08/2020   Acute on chronic HFrEF (heart failure with reduced ejection fraction) (HCC) 03/22/2020   Acute respiratory failure with hypoxia (HCC) 03/20/2020   Allergy    Arthritis    Atypical pneumonia 03/22/2020   CAD (coronary artery disease)    a. 03/2020 Cath: LM nl, LAD 60/21m, 55d, D1 70, D2 70, LCX 45ost/p, 65p, RCA 100ost CTO. RPDA fills via collats from dLAD. Inf septal fills via collats from 1st  septal, RPAV fills via collats from LPAV, RPL1/2 sev dzs-->med rx.   Chickenpox    Chronic combined systolic (congestive) and diastolic (congestive) heart failure (HCC)    a. 02/2021 Echo: EF 40-45%, glob HK, gr2 DD. Sev red RV fxn, RVSP 69.36mmHg. Mod dil LA, sev dil RA. Sev MR. Mild to mod Anita. Mean MV grad 6.71mmHg. Mod-Sev TR. Mild AI. Mild Ao sclerosis w/o stenosis.   COPD (chronic obstructive pulmonary disease) (HCC)    Elevated troponin 03/22/2020   GERD (gastroesophageal reflux disease)    Headache(784.0)    Migraine   Hearing loss    Right ear secondary to infection   History of shingles    Ischemic cardiomyopathy    a. 02/2021 Echo: EF 40-45%, glob HK.   Migraines    Multiple sclerosis (HCC)    Obese    Optic neuritis    PAH (pulmonary artery hypertension) (HCC)  a. 02/2021 Echo: RVSP 69.43mmHg.   Prolonged QT interval 03/20/2020   Severe mitral regurgitation    a. 02/2021 Echo: Severe MR w/ mild to mod Anita.  Mean MV grad 6.78mmHg.    Current Outpatient Medications  Medication Sig Dispense Refill   amiodarone (PACERONE) 100 MG tablet Take 1 tablet (100 mg total) by mouth daily. 90 tablet 3   aspirin EC 81 MG tablet Take 1 tablet (81 mg total) by mouth daily. Swallow whole. 30 tablet 0   atorvastatin (LIPITOR) 20 MG tablet Take 1 tablet daily 90 tablet 3   dapagliflozin propanediol (FARXIGA) 10 MG TABS tablet Take 1 tablet (10 mg total) by mouth daily before breakfast. 30 tablet 11   DULoxetine (CYMBALTA) 20 MG capsule TAKE 1 CAPSULE (20 MG TOTAL) BY MOUTH DAILY. FOR ANXIETY AND PAIN. 90 capsule 2   feeding supplement, ENSURE ENLIVE, (ENSURE ENLIVE) LIQD Take 237 mLs by mouth 2 (two) times daily between meals.     ferrous sulfate 325 (65 FE) MG EC tablet Take 1 tablet (325 mg total) by mouth daily. 60 tablet 3   fluticasone-salmeterol (ADVAIR DISKUS) 250-50 MCG/ACT AEPB INHALE 1 PUFF INTO THE LUNGS TWICE A DAY 60 each 5   KLOR-CON M20 20 MEQ tablet TAKE 2 TABLETS BY MOUTH 2 TIMES  DAILY. (Patient taking differently: Take 20 mEq by mouth 2 (two) times daily.) 360 tablet 1   levothyroxine (SYNTHROID) 25 MCG tablet TAKE 1 TABLET BY MOUTH DAILY BEFORE BREAKFAST. 90 tablet 1   metoprolol succinate (TOPROL-XL) 25 MG 24 hr tablet TAKE 1 TABLET (25 MG TOTAL) BY MOUTH DAILY. 90 tablet 3   nitroGLYCERIN (NITROSTAT) 0.4 MG SL tablet Place 1 tablet (0.4 mg total) under the tongue every 5 (five) minutes as needed for chest pain. 20 tablet 5   omeprazole (PRILOSEC) 40 MG capsule TAKE 1 CAPSULE (40 MG TOTAL) BY MOUTH DAILY. FOR HEARTBURN. (Patient taking differently: Take 40 mg by mouth at bedtime. For heartburn.) 90 capsule 2   Pediatric Multiple Vitamins (FLINTSTONES MULTIVITAMIN) CHEW Chew by mouth.     Probiotic Product (PROBIOTIC ADVANCED) CAPS Take 1 capsule by mouth daily.     spironolactone (ALDACTONE) 25 MG tablet Take 1 tablet (25 mg total) by mouth 2 (two) times daily. 180 tablet 3   torsemide (DEMADEX) 20 MG tablet Take 3 tablets (60 mg total) by mouth 2 (two) times daily. 180 tablet 5   trolamine salicylate (ASPERCREME) 10 % cream Apply 1 application  topically daily as needed for muscle pain (back or joint pain).     vitamin B-12 (CYANOCOBALAMIN) 500 MCG tablet Take 500 mcg by mouth 4 (four) times a week. 4 times weekly no more than 2000 MCG per week     Current Facility-Administered Medications  Medication Dose Route Frequency Provider Last Rate Last Admin   albumin human 25 % solution 25 g  25 g Intravenous Once [place]ma A, FNP       Vitals:   12/24/23 1431  BP: 111/68  Pulse: (!) 56  SpO2: 100%  Weight: 141 lb (64 kg)   Wt Readings from Last 3 Encounters:  12/24/23 141 lb (64 kg)  12/23/23 125 lb 9.6 oz (57 kg)  12/16/23 141 lb (64 kg)   Lab Results  Component Value Date   CREATININE 1.79 (H) 11/25/2023   CREATININE 1.95 (H) 09/23/2023   CREATININE 1.65 (H) 08/20/2023   PHYSICAL EXAM:  General:  Well appearing. No resp difficulty HEENT:  normal Neck: supple.JVP  normal. No lymphadenopathy or thryomegaly appreciated. Cor: PMI normal. Regular rhythm, bradycardic. No rubs, gallops, 2/6 HSM at apex Lungs: clear Abdomen: soft, nontender, no distention. No hepatosplenomegaly. No bruits or masses.  Extremities: no cyanosis, clubbing, rash, edema Neuro: alert & oriented x3, cranial nerves grossly intact. Moves all 4 extremities w/o difficulty. Affect pleasant.   ECG: not done   ASSESSMENT & PLAN:  1: Chronic heart failure with reduced ejection fraction- - suspect mixed ischemic and valvular disease - NYHA class III - euvolemic today - weighing daily; reminded to call for an overnight weight gain of > 2 pounds or a weekly weight gain of > 5 pounds - weight stable since her last visit here 3 months ago - Echo 03/20/20: EF 40-45% with moderately elevated PA pressure, mild LAE, moderate/ severe MR - Echo 02/07/21: EF of 40-45% along with severely elevated PA pressure of 69.2 mmHg, moderate LAE, severe MR, mild/ moderate Anita and moderate/severe TR.  - Echo 08/22/22: EF of 30-35% along with mild LVH and severe MR/TR.  - TEE 03/18/23: EF 40% with moderate LAE/ severe RAE and severe MR with restricted posterior leaflet, peak RV-RA gradient 48 mmHg, severe TR. No PFO or ASD.  - not adding salt and daughter says that she doesn't cook with salt either - saw [facility] provider Shirlee Latch) 05/24 - continue farxiga 10mg  daily - continue metoprolol succinate 25mg  daily - continue potassium BID - continue spironolactone 25mg  BID - continue torsemide 60mg  BID - had been hypotensive with entresto in the past - palliative care visit done 09/10/22 - BNP 08/20/23 was 1439.7  2: HTN- - BP 111/68 - saw PCP Chestine Spore) 10/24 - BMP 11/25/23 showed sodium 135, potassium 3.9, creatinine 1.79 & GFR 32   3: CAD- - saw cardiology Dan Humphreys) 10/23 - continue atorvastatin 20mg  daily - continue ASA 81mg  daily - LHC done 03/23/20 showed: Hemodynamics: LV end  diastolic pressure is moderately elevated. ----Coronary Angiography----- Ost RCA to Dist RCA lesion is 100% stenosed.-The PDA and PL system fills via faint collaterals from the AV groove LCx and LAD septals. Mid LAD-1 lesion is 60% stenosed. Mid LAD-2 lesion is 50% stenosed. Dist LAD lesion is 55% stenosed with 70% stenosed side branch in 2nd Diag. 1st Diag lesion is 70% stenosed. Ost Cx to Prox Cx lesion is 45% stenosed. Prox-MID Cx lesion is 65% stenosed.  MODERATE-SEVERE THREE-VESSEL CAD: 100% proximal RCA occlusion with left-to-right collaterals faintly filling PDA and PL system (AV groove LCx-PL and LAD septal-PDA) Diffuse moderate mid LAD disease with 60% to 50% stenosis. Tandem 50% and 65% proximal and mid LCx with the 65% lesion being the most significant lesion. Moderately elevated LVEDP of 18-20 mmHg  4: NSVT- - continue amiodarone 100mg  daily - required adenosine to terminate 10/23 - needs regular eye exams - TSH 05/22/23 was 14.093 - LDL 01/01/23 was 56  5: Cirrhosis w/ ascites- - suspect cardiogenic cirrhosis due to RV failure - periodic paracentesis; last one done 01/25 with removal of 6.3L; If the patient eventually requires >/=2 paracenteses in a 30 day period, candidacy for formal evaluation by the [facility] Hy[facility] will be assessed - saw GI Tobi Bastos) 01/24  6: Valvular heart disease- - TEE 03/18/23: EF 40% with moderate LAE/ severe RAE and severe MR. No PFO or ASD - does not qualifiy for mTEER due to cirrhosis, Anita and fraility. Dr Shirlee Latch discussed with structural heart team and even if it could be done, this would not fix her TR  which is also a large contributing factor - eventual hospice referral has previously been discussed  7: Anemia- - saw hematology Cathie Hoops) 01/25 - has received IV venofer in the past - Hg 12/21/23 was 13.1  Return in 3 months, sooner if needed.

## 2023-12-23 ENCOUNTER — Encounter: Payer: Self-pay | Admitting: Oncology

## 2023-12-23 ENCOUNTER — Inpatient Hospital Stay: Payer: Medicare Other

## 2023-12-23 ENCOUNTER — Inpatient Hospital Stay (HOSPITAL_BASED_OUTPATIENT_CLINIC_OR_DEPARTMENT_OTHER): Payer: Medicare Other | Admitting: Oncology

## 2023-12-23 VITALS — BP 113/61 | HR 56 | Temp 96.0°F | Resp 18 | Wt 125.6 lb

## 2023-12-23 DIAGNOSIS — E538 Deficiency of other specified B group vitamins: Secondary | ICD-10-CM | POA: Diagnosis not present

## 2023-12-23 DIAGNOSIS — N1832 Chronic kidney disease, stage 3b: Secondary | ICD-10-CM

## 2023-12-23 DIAGNOSIS — N1831 Chronic kidney disease, stage 3a: Secondary | ICD-10-CM | POA: Diagnosis not present

## 2023-12-23 DIAGNOSIS — R1909 Other intra-abdominal and pelvic swelling, mass and lump: Secondary | ICD-10-CM | POA: Diagnosis not present

## 2023-12-23 DIAGNOSIS — I2721 Secondary pulmonary arterial hypertension: Secondary | ICD-10-CM | POA: Diagnosis not present

## 2023-12-23 DIAGNOSIS — D631 Anemia in chronic kidney disease: Secondary | ICD-10-CM | POA: Diagnosis not present

## 2023-12-23 DIAGNOSIS — I252 Old myocardial infarction: Secondary | ICD-10-CM | POA: Diagnosis not present

## 2023-12-23 MED ORDER — FERROUS SULFATE 325 (65 FE) MG PO TBEC: 1.0000 | DELAYED_RELEASE_TABLET | Freq: Every day | ORAL | 3 refills | Status: DC

## 2023-12-23 NOTE — Assessment & Plan Note (Addendum)
 continue vitamin B12  3 times per week. Will check B12 at next visit.

## 2023-12-23 NOTE — Progress Notes (Signed)
 Hematology/Oncology Progress note [phone] [phone]      Patient Care Team: Gabriel John, NP as PCP - General (Internal Medicine) Sheryle Donning, MD as PCP - Cardiology (Cardiology) Timmy Forbes, MD as Consulting Physician (Hematology and Oncology) Merriam Abbey, DO as Consulting Physician (Neurology)  CHIEF COMPLAINTS/REASON FOR VISIT:  Follow up of anemia  ASSESSMENT & PLAN:   Anemia due to stage 3a chronic kidney disease (HCC) #Anemia, combination of iron  deficiency as well as chronic kidney disease. Labs reviewed and discussed with patient. Lab Results  Component Value Date   HGB 13.1 12/21/2023   TIBC 378 12/21/2023   IRONPCTSAT 15 12/21/2023   FERRITIN 102 12/21/2023   Hemoglobin has normalized. I will hold off Venofer . Recommend patient to take oral iron  supplementation once daily.    Vitamin B12 deficiency continue vitamin B12  3 times per week. Will check B12 at next visit.   CKD (chronic kidney disease) stage 3, GFR 30-59 ml/min (HCC) Encourage oral hydration and avoid nephrotoxins.    Orders Placed This Encounter  Procedures   CBC with Differential (Cancer Center Only)    Standing Status:   Future    Expected Date:   06/21/2024    Expiration Date:   12/22/2024   Ferritin    Standing Status:   Future    Expected Date:   06/21/2024    Expiration Date:   12/22/2024   Vitamin B12    Standing Status:   Future    Expected Date:   06/21/2024    Expiration Date:   12/22/2024   Iron  and TIBC    Standing Status:   Future    Expected Date:   06/21/2024    Expiration Date:   12/22/2024   Follow-up in 6 months. All questions were answered. The patient knows to call the clinic with any problems, questions or concerns.  Timmy Forbes, MD, PhD [facility] Hematology Oncology 12/23/2023      HISTORY OF PRESENTING ILLNESS:  Anita [name] is a  63 y.o.  female with PMH listed below who was referred to me for evaluation of  anemia Reviewed patient's recent labs that was done.  05/03/2020 labs revealed anemia with hemoglobin of 8.9, MCV 82.9 07/02/2020, iron  saturation 11, ferritin 91, TIBC 307, iron  34. Reviewed patient's previous labs , anemia is chronic onset , duration is since April 2021.  She did not have much baseline blood work prior to April 2021.  She did have some remote blood work that was done in 2012 when she had a normal hemoglobin of 13.5. Patient has multiple medical problems.  She is a poor historian.  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 Patient was hospitalized in April 2021 due to acute respiratory failure with hypoxia, due to atypical pneumonia, non-STEMI, severe three-vessel disease and pulmonary hypertension, AKI. 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 Bartley Borrow in 2007.  Patient declined pursuing further diagnosis or therapeutic options. She also has a chronic history of MS.  Patient was recently seen by gastroenterology and had colonoscopy done. 5 mm transverse colon polyp which was resected and retrieved.  Diverticulosis.  Nonbleeding internal hemorrhoids. Patient denies any bloody or black bowel movement.  She continues to have chronic nausea with no vomiting. Appetite is not good.  Weight has been relatively stable.  She also reports bilateral upper extremity ecchymosis which is a  chronic issue for her.  + CHF, ascites, she gets therapeutic paracentesis.   INTERVAL HISTORY Anita [name] is a 63 y.o. female who has above history reviewed by me today presents for follow up visit for management of Anemia Patient was accompanied by daughter. + chronic fatigue.  Improved.  She tolerated IV Venofer  treatments.   Review of Systems  Constitutional:  Positive for fatigue. Negative for appetite change, chills, fever and unexpected weight change.  HENT:   Negative for  hearing loss and voice change.   Eyes:  Negative for eye problems.  Respiratory:  Negative for chest tightness and cough.   Cardiovascular:  Negative for chest pain.  Gastrointestinal:  Negative for abdominal distention, abdominal pain, blood in stool and nausea.  Endocrine: Negative for hot flashes.  Genitourinary:  Negative for difficulty urinating and frequency.   Musculoskeletal:  Negative for arthralgias.  Skin:  Negative for itching and rash.  Neurological:  Negative for extremity weakness.  Hematological:  Negative for adenopathy. Bruises/bleeds easily.  Psychiatric/Behavioral:  Negative for confusion.      MEDICAL HISTORY:  Past Medical History:  Diagnosis Date   Abnormality of gait 07/26/2013   Acute kidney injury superimposed on stage IIIa chronic kidney disease (HCC) 03/20/2020   Acute on chronic combined systolic and diastolic CHF (congestive heart failure) (HCC) 10/08/2020   Acute on chronic HFrEF (heart failure with reduced ejection fraction) (HCC) 03/22/2020   Acute respiratory failure with hypoxia (HCC) 03/20/2020   Allergy    Arthritis    Atypical pneumonia 03/22/2020   CAD (coronary artery disease)    a. 03/2020 Cath: LM nl, LAD 60/23m, 55d, D1 70, D2 70, LCX 45ost/p, 65p, RCA 100ost CTO. RPDA fills via collats from dLAD. Inf septal fills via collats from 1st septal, RPAV fills via collats from LPAV, RPL1/2 sev dzs-->med rx.   Chickenpox    Chronic combined systolic (congestive) and diastolic (congestive) heart failure (HCC)    a. 02/2021 Echo: EF 40-45%, glob HK, gr2 DD. Sev red RV fxn, RVSP 69.57mmHg. Mod dil LA, sev dil RA. Sev MR. Mild to mod MS. Mean MV grad 6.73mmHg. Mod-Sev TR. Mild AI. Mild Ao sclerosis w/o stenosis.   COPD (chronic obstructive pulmonary disease) (HCC)    Elevated troponin 03/22/2020   GERD (gastroesophageal reflux disease)    Headache(784.0)    Migraine   Hearing loss    Right ear secondary to infection   History of shingles    Ischemic  cardiomyopathy    a. 02/2021 Echo: EF 40-45%, glob HK.   Migraines    Multiple sclerosis (HCC)    Obese    Optic neuritis    PAH (pulmonary artery hypertension) (HCC)    a. 02/2021 Echo: RVSP 69.74mmHg.   Prolonged QT interval 03/20/2020   Severe mitral regurgitation    a. 02/2021 Echo: Severe MR w/ mild to mod MS.  Mean MV grad 6.60mmHg.    SURGICAL HISTORY: Past Surgical History:  Procedure Laterality Date   COLONOSCOPY WITH PROPOFOL  N/A 08/03/2020   Procedure: COLONOSCOPY WITH PROPOFOL ;  Surgeon: Luke Salaam, MD;  Location: [facility] ENDOSCOPY;  Service: Gastroenterology;  Laterality: N/A;   Ganglioneuroma     Resection   LEFT HEART CATH AND CORONARY ANGIOGRAPHY N/A 03/23/2020   Procedure: LEFT HEART CATH AND CORONARY ANGIOGRAPHY;  Surgeon: Arleen Lacer, MD;  Location: [facility] INVASIVE CV LAB;  Service: Cardiovascular;  Laterality: N/A;   PILONIDAL CYST EXCISION     PILONIDAL CYST EXCISION  1983  TEE WITHOUT CARDIOVERSION N/A 03/18/2023   Procedure: TRANSESOPHAGEAL ECHOCARDIOGRAM (TEE);  Surgeon: Darlis Eisenmenger, MD;  Location: [facility] ORS;  Service: Cardiovascular;  Laterality: N/A;   TUMOR REMOVAL  2007/2008    SOCIAL HISTORY: Social History   Socioeconomic History   Marital status: Divorced    Spouse name: Not on file   Number of children: 2   Years of education: Not on file   Highest education level: Some college, no degree  Occupational History   Not on file  Tobacco Use   Smoking status: Former    Current packs/day: 1.00    Types: Cigarettes   Smokeless tobacco: Never  Vaping Use   Vaping status: Never Used  Substance and Sexual Activity   Alcohol use: Not Currently    Comment: Occasional   Drug use: Never   Sexual activity: Not on file  Other Topics Concern   Not on file  Social History Narrative   Right handed    Lives in one story home   Drinks Caffeine - Sweet Tea    Social Drivers of Health   Financial Resource Strain: Low Risk  (11/27/2023)   Overall  Financial Resource Strain (CARDIA)    Difficulty of Paying Living Expenses: Not hard at all  Food Insecurity: No Food Insecurity (11/27/2023)   Hunger Vital Sign    Worried About Running Out of Food in the Last Year: Never true    Ran Out of Food in the Last Year: Never true  Transportation Needs: No Transportation Needs (11/27/2023)   PRAPARE - Administrator, Civil Service (Medical): No    Lack of Transportation (Non-Medical): No  Physical Activity: Inactive (11/27/2023)   Exercise Vital Sign    Days of Exercise per Week: 0 days    Minutes of Exercise per Session: 0 min  Stress: No Stress Concern Present (11/27/2023)   [employer] of Occupational Health - Occupational Stress Questionnaire    Feeling of Stress : Only a little  Social Connections: Socially Isolated (11/27/2023)   Social Connection and Isolation Panel [NHANES]    Frequency of Communication with Friends and Family: Twice a week    Frequency of Social Gatherings with Friends and Family: Three times a week    Attends Religious Services: Never    Active Member of Clubs or Organizations: No    Attends Banker Meetings: Never    Marital Status: Divorced  Catering manager Violence: Not At Risk (11/27/2023)   Humiliation, Afraid, Rape, and Kick questionnaire    Fear of Current or Ex-Partner: No    Emotionally Abused: No    Physically Abused: No    Sexually Abused: No    FAMILY HISTORY: Family History  Problem Relation Age of Onset   Skin cancer Mother    Lung cancer Father    Uterine cancer Sister    Heart disease Brother    Bipolar disorder Sister    Throat cancer Paternal Grandmother     ALLERGIES:  is allergic to acthar  hp [corticotropin ], codeine, and prednisone.  MEDICATIONS:  Current Outpatient Medications  Medication Sig Dispense Refill   amiodarone  (PACERONE ) 100 MG tablet Take 1 tablet (100 mg total) by mouth daily. 90 tablet 3   aspirin  EC 81 MG tablet Take 1 tablet (81  mg total) by mouth daily. Swallow whole. 30 tablet 0   atorvastatin  (LIPITOR) 20 MG tablet Take 1 tablet daily 90 tablet 3   dapagliflozin  propanediol (FARXIGA ) 10 MG TABS tablet Take  1 tablet (10 mg total) by mouth daily before breakfast. 30 tablet 11   DULoxetine  (CYMBALTA ) 20 MG capsule TAKE 1 CAPSULE (20 MG TOTAL) BY MOUTH DAILY. FOR ANXIETY AND PAIN. 90 capsule 2   feeding supplement, ENSURE ENLIVE, (ENSURE ENLIVE) LIQD Take 237 mLs by mouth 2 (two) times daily between meals.     fluticasone -salmeterol (ADVAIR DISKUS) 250-50 MCG/ACT AEPB INHALE 1 PUFF INTO THE LUNGS TWICE A DAY 60 each 5   levothyroxine  (SYNTHROID ) 25 MCG tablet TAKE 1 TABLET BY MOUTH DAILY BEFORE BREAKFAST. 90 tablet 1   metoprolol  succinate (TOPROL -XL) 25 MG 24 hr tablet TAKE 1 TABLET (25 MG TOTAL) BY MOUTH DAILY. 90 tablet 3   omeprazole  (PRILOSEC) 40 MG capsule TAKE 1 CAPSULE (40 MG TOTAL) BY MOUTH DAILY. FOR HEARTBURN. (Patient taking differently: Take 40 mg by mouth at bedtime. For heartburn.) 90 capsule 2   Pediatric Multiple Vitamins (FLINTSTONES MULTIVITAMIN) CHEW Chew by mouth.     potassium chloride  SA (KLOR-CON  M) 20 MEQ tablet Take 20 mEq by mouth 2 (two) times daily.     Probiotic Product (PROBIOTIC ADVANCED) CAPS Take 1 capsule by mouth daily.     spironolactone  (ALDACTONE ) 25 MG tablet Take 1 tablet (25 mg total) by mouth 2 (two) times daily. 180 tablet 3   torsemide  (DEMADEX ) 20 MG tablet Take 3 tablets (60 mg total) by mouth 2 (two) times daily. 180 tablet 5   trolamine salicylate (ASPERCREME) 10 % cream Apply 1 application  topically daily as needed for muscle pain (back or joint pain).     vitamin B-12 (CYANOCOBALAMIN ) 500 MCG tablet Take 500 mcg by mouth 4 (four) times a week. 4 times weekly no more than 2000 MCG per week     ferrous sulfate  325 (65 FE) MG EC tablet Take 1 tablet (325 mg total) by mouth daily. 90 tablet 3   nitroGLYCERIN  (NITROSTAT ) 0.4 MG SL tablet Place 1 tablet (0.4 mg total) under the  tongue every 5 (five) minutes as needed for chest pain. (Patient not taking: Reported on 12/23/2023) 20 tablet 5   Current Facility-Administered Medications  Medication Dose Route Frequency Provider Last Rate Last Admin   albumin  human 25 % solution 25 g  25 g Intravenous Once Shawnee Dellen A, FNP         PHYSICAL EXAMINATION: ECOG PERFORMANCE STATUS: 2 - Symptomatic, <50% confined to bed Vitals:   12/23/23 1340  BP: 113/61  Pulse: (!) 56  Resp: 18  Temp: (!) 96 F (35.6 C)  SpO2: 100%   Filed Weights   12/23/23 1340  Weight: 125 lb 9.6 oz (57 kg)    Physical Exam Constitutional:      General: She is not in acute distress.    Comments: Patient sits in the wheelchair  HENT:     Head: Normocephalic and atraumatic.  Eyes:     General: No scleral icterus. Cardiovascular:     Rate and Rhythm: Normal rate and regular rhythm.     Heart sounds: Murmur heard.  Pulmonary:     Effort: Pulmonary effort is normal. No respiratory distress.     Breath sounds: No wheezing.     Comments: Decreased breath sound bilaterally. Abdominal:     General: Bowel sounds are normal. There is distension.     Palpations: Abdomen is soft.  Musculoskeletal:        General: No deformity. Normal range of motion.     Cervical back: Normal range of motion and neck supple.  Skin:    General: Skin is warm and dry.     Comments: Bilateral upper extremity ecchymosis.  Neurological:     Mental Status: She is alert and oriented to person, place, and time. Mental status is at baseline.  Psychiatric:        Mood and Affect: Mood normal.      LABORATORY DATA:  I have reviewed the data as listed    Latest Ref Rng & Units 12/21/2023   11:00 AM 07/28/2023   11:42 AM 05/22/2023    2:52 PM  CBC  WBC 4.0 - 10.5 K/uL 4.3  6.7  7.1   Hemoglobin 12.0 - 15.0 g/dL 25.3  66.4  40.3   Hematocrit 36.0 - 46.0 % 43.3  34.5  37.4   Platelets 150 - 400 K/uL 174  176  199       Latest Ref Rng & Units 11/25/2023     1:39 PM 09/23/2023   10:48 AM 08/20/2023   11:26 AM  CMP  Glucose 70 - 99 mg/dL 86  86  95   BUN 6 - 20 mg/dL 29  43  43   Creatinine 0.44 - 1.00 mg/dL 4.74  2.59  5.63   Sodium 135 - 145 mmol/L 135  137  136   Potassium 3.5 - 5.1 mmol/L 3.9  5.1  4.1   Chloride 98 - 111 mmol/L 108  106  108   CO2 22 - 32 mmol/L 18  17  19    Calcium  8.9 - 10.3 mg/dL 8.6  8.8  8.7      Iron /TIBC/Ferritin/ %Sat    Component Value Date/Time   IRON  57 12/21/2023 1100   IRON  39 12/25/2022 1416   TIBC 378 12/21/2023 1100   TIBC 288 12/25/2022 1416   FERRITIN 102 12/21/2023 1100   FERRITIN 152 (H) 12/25/2022 1416   IRONPCTSAT 15 12/21/2023 1100   IRONPCTSAT 14 (L) 12/25/2022 1416

## 2023-12-23 NOTE — Assessment & Plan Note (Signed)
 Encourage oral hydration and avoid nephrotoxins.

## 2023-12-23 NOTE — Assessment & Plan Note (Addendum)
#  Anemia, combination of iron  deficiency as well as chronic kidney disease. Labs reviewed and discussed with patient. Lab Results  Component Value Date   HGB 13.1 12/21/2023   TIBC 378 12/21/2023   IRONPCTSAT 15 12/21/2023   FERRITIN 102 12/21/2023   Hemoglobin has normalized. I will hold off Venofer . Recommend patient to take oral iron  supplementation once daily.

## 2023-12-24 ENCOUNTER — Encounter: Payer: Self-pay | Admitting: Family

## 2023-12-24 ENCOUNTER — Ambulatory Visit: Payer: Medicare Other | Attending: Family | Admitting: Family

## 2023-12-24 VITALS — BP 111/68 | HR 56 | Wt 141.0 lb

## 2023-12-24 DIAGNOSIS — I38 Endocarditis, valve unspecified: Secondary | ICD-10-CM | POA: Insufficient documentation

## 2023-12-24 DIAGNOSIS — Z7982 Long term (current) use of aspirin: Secondary | ICD-10-CM | POA: Diagnosis not present

## 2023-12-24 DIAGNOSIS — I5042 Chronic combined systolic (congestive) and diastolic (congestive) heart failure: Secondary | ICD-10-CM | POA: Insufficient documentation

## 2023-12-24 DIAGNOSIS — I130 Hypertensive heart and chronic kidney disease with heart failure and stage 1 through stage 4 chronic kidney disease, or unspecified chronic kidney disease: Secondary | ICD-10-CM | POA: Diagnosis not present

## 2023-12-24 DIAGNOSIS — G35 Multiple sclerosis: Secondary | ICD-10-CM | POA: Diagnosis not present

## 2023-12-24 DIAGNOSIS — I471 Supraventricular tachycardia, unspecified: Secondary | ICD-10-CM | POA: Insufficient documentation

## 2023-12-24 DIAGNOSIS — K219 Gastro-esophageal reflux disease without esophagitis: Secondary | ICD-10-CM | POA: Diagnosis not present

## 2023-12-24 DIAGNOSIS — J449 Chronic obstructive pulmonary disease, unspecified: Secondary | ICD-10-CM | POA: Insufficient documentation

## 2023-12-24 DIAGNOSIS — I2721 Secondary pulmonary arterial hypertension: Secondary | ICD-10-CM | POA: Diagnosis not present

## 2023-12-24 DIAGNOSIS — D508 Other iron deficiency anemias: Secondary | ICD-10-CM

## 2023-12-24 DIAGNOSIS — I5022 Chronic systolic (congestive) heart failure: Secondary | ICD-10-CM

## 2023-12-24 DIAGNOSIS — I10 Essential (primary) hypertension: Secondary | ICD-10-CM

## 2023-12-24 DIAGNOSIS — N1831 Chronic kidney disease, stage 3a: Secondary | ICD-10-CM | POA: Diagnosis not present

## 2023-12-24 DIAGNOSIS — I472 Ventricular tachycardia, unspecified: Secondary | ICD-10-CM | POA: Insufficient documentation

## 2023-12-24 DIAGNOSIS — K7460 Unspecified cirrhosis of liver: Secondary | ICD-10-CM | POA: Diagnosis not present

## 2023-12-24 DIAGNOSIS — R079 Chest pain, unspecified: Secondary | ICD-10-CM | POA: Diagnosis not present

## 2023-12-24 DIAGNOSIS — Z79899 Other long term (current) drug therapy: Secondary | ICD-10-CM | POA: Insufficient documentation

## 2023-12-24 DIAGNOSIS — R188 Other ascites: Secondary | ICD-10-CM | POA: Insufficient documentation

## 2023-12-24 DIAGNOSIS — I4729 Other ventricular tachycardia: Secondary | ICD-10-CM

## 2023-12-24 DIAGNOSIS — I2510 Atherosclerotic heart disease of native coronary artery without angina pectoris: Secondary | ICD-10-CM | POA: Insufficient documentation

## 2023-12-24 DIAGNOSIS — D649 Anemia, unspecified: Secondary | ICD-10-CM | POA: Diagnosis not present

## 2023-12-24 DIAGNOSIS — I340 Nonrheumatic mitral (valve) insufficiency: Secondary | ICD-10-CM

## 2023-12-24 NOTE — Patient Instructions (Signed)
It was good to see you today!

## 2024-01-07 ENCOUNTER — Other Ambulatory Visit (HOSPITAL_COMMUNITY): Payer: Self-pay

## 2024-01-07 NOTE — Progress Notes (Signed)
Paramedicine Encounter    Patient ID: Anita [name], female    DOB: 1961-07-23, 63 y.o.   MRN: [id number]   Complaints-no new complaints- same dizziness   Edema-none   Compliance with meds-yes   Pill box filled-yes  If so, by whom-daughter   Refills needed-    Pt reports she is doing good. No increased fluid since her paracentesis earlier this month.  Breathing is doing good.  Dizziness stays the same (MS)  Sometimes she gets a stabbing pain to the left center of her chest. Goes away very quickly on its own, she does not take anything for it.  He weight is slowly decreasing but reports good appetite and drinking fluids.  Daughter is doing all her meds and handling everything.  No issues with getting meds with costs.  No issues or concerns at the moment.  Will f/u in 2 wks.    BP 98/70   Pulse (!) 54   Resp 16   Wt 134 lb (60.8 kg)   SpO2 99%   BMI 23.74 kg/m  Weight yesterday-134  Last visit weight-141   Patient Care Team: Doreene Nest, NP as PCP - General (Internal Medicine) Jodelle Red, MD as PCP - Cardiology (Cardiology) Rickard Patience, MD as Consulting Physician (Hematology and Oncology) Drema Dallas, DO as Consulting Physician (Neurology)  Patient Active Problem List   Diagnosis Date Noted   Cerumen impaction 09/16/2023   CKD (chronic kidney disease) stage 3, GFR 30-59 ml/min (HCC) 04/20/2023   Anemia due to stage 3a chronic kidney disease (HCC) 10/01/2022   Dilated cardiomyopathy (HCC) 09/19/2022   Cirrhosis of liver with ascites (HCC) 09/19/2022   Generalized weakness    Paroxysmal SVT (supraventricular tachycardia) (HCC) 09/16/2022   Stage 3b chronic kidney disease (HCC) 09/16/2022   HFrEF (heart failure with reduced ejection fraction) (HCC) 09/16/2022   Hypervolemia    Cardiorenal syndrome    Hypokalemia 08/27/2022   Transaminitis 08/23/2022   Elevated liver enzymes    Mitral valve insufficiency 05/19/2021   Hypoxia 05/19/2021    Anasarca 05/19/2021   Esophageal dysphagia 01/08/2021   Ischemic cardiomyopathy 10/08/2020   Coronary artery disease involving native coronary artery of native heart without angina pectoris 10/08/2020   IDA (iron deficiency anemia) 10/05/2020   Vitamin B12 deficiency 08/23/2020   COPD (chronic obstructive pulmonary disease) (HCC) 06/22/2020   Tobacco abuse 03/22/2020   Pulmonary hypertension (HCC) 03/22/2020   Insomnia 03/22/2020   SIRS (systemic inflammatory response syndrome) (HCC) 03/20/2020   Normocytic anemia 03/20/2020   Gastroesophageal reflux disease 02/15/2018   Chronic migraine without aura with status migrainosus, not intractable 02/15/2018   Osteoarthritis 02/15/2018   Essential hypertension 02/15/2018   Disturbance of skin sensation 03/26/2012   Multiple sclerosis (HCC) 03/26/2012    Current Outpatient Medications:    amiodarone (PACERONE) 100 MG tablet, Take 1 tablet (100 mg total) by mouth daily., Disp: 90 tablet, Rfl: 3   aspirin EC 81 MG tablet, Take 1 tablet (81 mg total) by mouth daily. Swallow whole., Disp: 30 tablet, Rfl: 0   atorvastatin (LIPITOR) 20 MG tablet, Take 1 tablet daily, Disp: 90 tablet, Rfl: 3   dapagliflozin propanediol (FARXIGA) 10 MG TABS tablet, Take 1 tablet (10 mg total) by mouth daily before breakfast., Disp: 30 tablet, Rfl: 11   DULoxetine (CYMBALTA) 20 MG capsule, TAKE 1 CAPSULE (20 MG TOTAL) BY MOUTH DAILY. FOR ANXIETY AND PAIN., Disp: 90 capsule, Rfl: 2   feeding supplement, ENSURE ENLIVE, (ENSURE ENLIVE) LIQD, Take  237 mLs by mouth 2 (two) times daily between meals., Disp: , Rfl:    ferrous sulfate 325 (65 FE) MG EC tablet, Take 1 tablet (325 mg total) by mouth daily., Disp: 90 tablet, Rfl: 3   fluticasone-salmeterol (ADVAIR DISKUS) 250-50 MCG/ACT AEPB, INHALE 1 PUFF INTO THE LUNGS TWICE A DAY, Disp: 60 each, Rfl: 5   levothyroxine (SYNTHROID) 25 MCG tablet, TAKE 1 TABLET BY MOUTH DAILY BEFORE BREAKFAST., Disp: 90 tablet, Rfl: 1    metoprolol succinate (TOPROL-XL) 25 MG 24 hr tablet, TAKE 1 TABLET (25 MG TOTAL) BY MOUTH DAILY., Disp: 90 tablet, Rfl: 3   nitroGLYCERIN (NITROSTAT) 0.4 MG SL tablet, Place 1 tablet (0.4 mg total) under the tongue every 5 (five) minutes as needed for chest pain., Disp: 20 tablet, Rfl: 5   omeprazole (PRILOSEC) 40 MG capsule, TAKE 1 CAPSULE (40 MG TOTAL) BY MOUTH DAILY. FOR HEARTBURN. (Patient taking differently: Take 40 mg by mouth at bedtime. For heartburn.), Disp: 90 capsule, Rfl: 2   Pediatric Multiple Vitamins (FLINTSTONES MULTIVITAMIN) CHEW, Chew by mouth., Disp: , Rfl:    potassium chloride SA (KLOR-CON M) 20 MEQ tablet, Take 20 mEq by mouth 2 (two) times daily., Disp: , Rfl:    Probiotic Product (PROBIOTIC ADVANCED) CAPS, Take 1 capsule by mouth daily., Disp: , Rfl:    spironolactone (ALDACTONE) 25 MG tablet, Take 1 tablet (25 mg total) by mouth 2 (two) times daily., Disp: 180 tablet, Rfl: 3   torsemide (DEMADEX) 20 MG tablet, Take 3 tablets (60 mg total) by mouth 2 (two) times daily., Disp: 180 tablet, Rfl: 5   trolamine salicylate (ASPERCREME) 10 % cream, Apply 1 application  topically daily as needed for muscle pain (back or joint pain)., Disp: , Rfl:    vitamin B-12 (CYANOCOBALAMIN) 500 MCG tablet, Take 500 mcg by mouth 4 (four) times a week. 4 times weekly no more than 2000 MCG per week, Disp: , Rfl:   Current Facility-Administered Medications:    albumin human 25 % solution 25 g, 25 g, Intravenous, Once, Hackney, Tina A, FNP Allergies  Allergen Reactions   Acthar Hp [Corticotropin] Other (See Comments)    Throat swelling and coughing up blood   Codeine    Prednisone       Social History   Socioeconomic History   Marital status: Divorced    Spouse name: Not on file   Number of children: 2   Years of education: Not on file   Highest education level: Some college, no degree  Occupational History   Not on file  Tobacco Use   Smoking status: Former    Current packs/day:  1.00    Types: Cigarettes   Smokeless tobacco: Never  Vaping Use   Vaping status: Never Used  Substance and Sexual Activity   Alcohol use: Not Currently    Comment: Occasional   Drug use: Never   Sexual activity: Not on file  Other Topics Concern   Not on file  Social History Narrative   Right handed    Lives in one story home   Drinks Caffeine - Sweet Tea    Social Drivers of Health   Financial Resource Strain: Low Risk  (11/27/2023)   Overall Financial Resource Strain (CARDIA)    Difficulty of Paying Living Expenses: Not hard at all  Food Insecurity: No Food Insecurity (11/27/2023)   Hunger Vital Sign    Worried About Running Out of Food in the Last Year: Never true    Ran Out  of Food in the Last Year: Never true  Transportation Needs: No Transportation Needs (11/27/2023)   PRAPARE - Administrator, Civil Service (Medical): No    Lack of Transportation (Non-Medical): No  Physical Activity: Inactive (11/27/2023)   Exercise Vital Sign    Days of Exercise per Week: 0 days    Minutes of Exercise per Session: 0 min  Stress: No Stress Concern Present (11/27/2023)   [employer] of Occupational Health - Occupational Stress Questionnaire    Feeling of Stress : Only a little  Social Connections: Socially Isolated (11/27/2023)   Social Connection and Isolation Panel [NHANES]    Frequency of Communication with Friends and Family: Twice a week    Frequency of Social Gatherings with Friends and Family: Three times a week    Attends Religious Services: Never    Active Member of Clubs or Organizations: No    Attends Banker Meetings: Never    Marital Status: Divorced  Catering manager Violence: Not At Risk (11/27/2023)   Humiliation, Afraid, Rape, and Kick questionnaire    Fear of Current or Ex-Partner: No    Emotionally Abused: No    Physically Abused: No    Sexually Abused: No    Physical Exam      Future Appointments  Date Time Provider  Department Center  03/24/2024  2:30 PM Delma Freeze, FNP [facility] None  06/29/2024 12:45 PM [facility] None  07/01/2024 12:15 PM Rickard Patience, MD [facility] None  07/01/2024  1:00 PM [facility] INFUSION CHAIR 1 [facility] None  09/15/2024  9:50 AM Drema Dallas, DO [facility] None  11/29/2024  1:00 PM [facility] ANNUAL WELLNESS VISIT 1 [facility] PEC       Kerry Hough, Paramedic [phone] [facility] Paramedic  01/07/24

## 2024-01-15 ENCOUNTER — Other Ambulatory Visit: Payer: Self-pay | Admitting: Cardiology

## 2024-01-15 DIAGNOSIS — I5020 Unspecified systolic (congestive) heart failure: Secondary | ICD-10-CM

## 2024-01-28 ENCOUNTER — Telehealth (HOSPITAL_COMMUNITY): Payer: Self-pay

## 2024-01-28 NOTE — Telephone Encounter (Signed)
Had to resch her home visit due to inclement weather. Will f/u next week.   Kerry Hough, EMT-Paramedic  [phone] 01/28/2024

## 2024-02-03 ENCOUNTER — Other Ambulatory Visit (HOSPITAL_COMMUNITY): Payer: Self-pay

## 2024-02-03 NOTE — Progress Notes (Signed)
 Paramedicine Encounter    Patient ID: Anita [name], female    DOB: 1961/06/03, 63 y.o.   MRN: [id number]   Complaints-no changes   Edema-none   Compliance with meds-yes   Pill box filled-daughter fills  If so, by whom-daughter   Refills needed-none   Pt reports she is doing well. She reports no changes. Eating and drinking ok. Taking meds.  Her daughter handles appoint/meds etc.  No bloating to her abd.  No edema noted.  No c/p, no increased sob, dizziness same.  She said her vision sucks. She has not had regular eye exams in long time so advised her to get plugged in with that to get evaluated.  Her daughter has been doing great job managing her appointments, meds, and has her mychart set up to reach provider at any time needed.  Her daughter and the pt is very well aware of her fluid getting worse and when the need of a paracentesis is needed and communicates that with provider asap.  From a HF standpoint she is stable for d/c at this time.  Should anything change she can definitely be re-referred. However after discussion with daughter and pt, they are open again to palliative care. She was referred once before but their service line changed and was not able to get services anymore but would be open to [facility] for referral.  I will send a note in for that to provider.    Patient is now discharged from [facility].  Patient has/has not met the following goals:  Yes :Patient expresses basic understanding of medications and what they are for Yes :Patient able to verbalize heart failure specific dietary/fluid restrictions Yes :Patient is aware of who to call if they have medical concerns or if they need to schedule or change appts Yes :Patient has a scale for daily weights and weighs regularly Yes :Patient able to verbalize concerning symptoms when they should call the HF clinic (weight gain ranges, etc) Yes :Patient has a PCP and has  seen within the past year or has upcoming appt Yes :Patient has reliable access to getting their medications Yes :Patient has shown they are able to reorder medications reliably No :Patient has had admission in past 30 days- if yes how many? No :Patient has had admission in past 90 days- if yes how many?  Discharge Comments:  Pts daughter handles meds, food, appointments.  At this time no further needs from paramedicine, she does want referral sent to [facility] for their palliative services.      BP 108/62   Pulse (!) 54   Resp 17   Wt 133 lb (60.3 kg)   SpO2 98%   BMI 23.56 kg/m  Weight yesterday--133 Last visit weight-134  Patient Care Team: Doreene Nest, NP as PCP - General (Internal Medicine) Jodelle Red, MD as PCP - Cardiology (Cardiology) Rickard Patience, MD as Consulting Physician (Hematology and Oncology) Drema Dallas, DO as Consulting Physician (Neurology)  Patient Active Problem List   Diagnosis Date Noted   Cerumen impaction 09/16/2023   CKD (chronic kidney disease) stage 3, GFR 30-59 ml/min (HCC) 04/20/2023   Anemia due to stage 3a chronic kidney disease (HCC) 10/01/2022   Dilated cardiomyopathy (HCC) 09/19/2022   Cirrhosis of liver with ascites (HCC) 09/19/2022   Generalized weakness    Paroxysmal SVT (supraventricular tachycardia) (HCC) 09/16/2022   Stage 3b chronic kidney disease (HCC) 09/16/2022   HFrEF (heart failure with reduced  ejection fraction) (HCC) 09/16/2022   Hypervolemia    Cardiorenal syndrome    Hypokalemia 08/27/2022   Transaminitis 08/23/2022   Elevated liver enzymes    Mitral valve insufficiency 05/19/2021   Hypoxia 05/19/2021   Anasarca 05/19/2021   Esophageal dysphagia 01/08/2021   Ischemic cardiomyopathy 10/08/2020   Coronary artery disease involving native coronary artery of native heart without angina pectoris 10/08/2020   IDA (iron deficiency anemia) 10/05/2020   Vitamin B12 deficiency 08/23/2020   COPD  (chronic obstructive pulmonary disease) (HCC) 06/22/2020   Tobacco abuse 03/22/2020   Pulmonary hypertension (HCC) 03/22/2020   Insomnia 03/22/2020   SIRS (systemic inflammatory response syndrome) (HCC) 03/20/2020   Normocytic anemia 03/20/2020   Gastroesophageal reflux disease 02/15/2018   Chronic migraine without aura with status migrainosus, not intractable 02/15/2018   Osteoarthritis 02/15/2018   Essential hypertension 02/15/2018   Disturbance of skin sensation 03/26/2012   Multiple sclerosis (HCC) 03/26/2012    Current Outpatient Medications:    amiodarone (PACERONE) 100 MG tablet, Take 1 tablet (100 mg total) by mouth daily., Disp: 90 tablet, Rfl: 3   aspirin EC 81 MG tablet, Take 1 tablet (81 mg total) by mouth daily. Swallow whole., Disp: 30 tablet, Rfl: 0   atorvastatin (LIPITOR) 20 MG tablet, Take 1 tablet daily, Disp: 90 tablet, Rfl: 3   dapagliflozin propanediol (FARXIGA) 10 MG TABS tablet, Take 1 tablet (10 mg total) by mouth daily before breakfast., Disp: 30 tablet, Rfl: 11   DULoxetine (CYMBALTA) 20 MG capsule, TAKE 1 CAPSULE (20 MG TOTAL) BY MOUTH DAILY. FOR ANXIETY AND PAIN., Disp: 90 capsule, Rfl: 2   feeding supplement, ENSURE ENLIVE, (ENSURE ENLIVE) LIQD, Take 237 mLs by mouth 2 (two) times daily between meals., Disp: , Rfl:    ferrous sulfate 325 (65 FE) MG EC tablet, Take 1 tablet (325 mg total) by mouth daily., Disp: 90 tablet, Rfl: 3   fluticasone-salmeterol (ADVAIR DISKUS) 250-50 MCG/ACT AEPB, INHALE 1 PUFF INTO THE LUNGS TWICE A DAY, Disp: 60 each, Rfl: 5   levothyroxine (SYNTHROID) 25 MCG tablet, TAKE 1 TABLET BY MOUTH DAILY BEFORE BREAKFAST., Disp: 90 tablet, Rfl: 1   metoprolol succinate (TOPROL-XL) 25 MG 24 hr tablet, TAKE 1 TABLET (25 MG TOTAL) BY MOUTH DAILY., Disp: 90 tablet, Rfl: 3   nitroGLYCERIN (NITROSTAT) 0.4 MG SL tablet, Place 1 tablet (0.4 mg total) under the tongue every 5 (five) minutes as needed for chest pain., Disp: 20 tablet, Rfl: 5    omeprazole (PRILOSEC) 40 MG capsule, TAKE 1 CAPSULE (40 MG TOTAL) BY MOUTH DAILY. FOR HEARTBURN. (Patient taking differently: Take 40 mg by mouth at bedtime. For heartburn.), Disp: 90 capsule, Rfl: 2   Pediatric Multiple Vitamins (FLINTSTONES MULTIVITAMIN) CHEW, Chew by mouth., Disp: , Rfl:    potassium chloride SA (KLOR-CON M20) 20 MEQ tablet, Take 1 tablet  (20 MEQ) by mouth twice daily., Disp: 180 tablet, Rfl: 1   Probiotic Product (PROBIOTIC ADVANCED) CAPS, Take 1 capsule by mouth daily., Disp: , Rfl:    spironolactone (ALDACTONE) 25 MG tablet, Take 1 tablet (25 mg total) by mouth 2 (two) times daily., Disp: 180 tablet, Rfl: 3   torsemide (DEMADEX) 20 MG tablet, Take 3 tablets (60 mg total) by mouth 2 (two) times daily., Disp: 180 tablet, Rfl: 5   trolamine salicylate (ASPERCREME) 10 % cream, Apply 1 application  topically daily as needed for muscle pain (back or joint pain)., Disp: , Rfl:    vitamin B-12 (CYANOCOBALAMIN) 500 MCG tablet, Take 500 mcg  by mouth 4 (four) times a week. 4 times weekly no more than 2000 MCG per week, Disp: , Rfl:   Current Facility-Administered Medications:    albumin human 25 % solution 25 g, 25 g, Intravenous, Once, Hackney, Tina A, FNP Allergies  Allergen Reactions   Acthar Hp [Corticotropin] Other (See Comments)    Throat swelling and coughing up blood   Codeine    Prednisone       Social History   Socioeconomic History   Marital status: Divorced    Spouse name: Not on file   Number of children: 2   Years of education: Not on file   Highest education level: Some college, no degree  Occupational History   Not on file  Tobacco Use   Smoking status: Former    Current packs/day: 1.00    Types: Cigarettes   Smokeless tobacco: Never  Vaping Use   Vaping status: Never Used  Substance and Sexual Activity   Alcohol use: Not Currently    Comment: Occasional   Drug use: Never   Sexual activity: Not on file  Other Topics Concern   Not on file   Social History Narrative   Right handed    Lives in one story home   Drinks Caffeine - Sweet Tea    Social Drivers of Health   Financial Resource Strain: Low Risk  (11/27/2023)   Overall Financial Resource Strain (CARDIA)    Difficulty of Paying Living Expenses: Not hard at all  Food Insecurity: No Food Insecurity (11/27/2023)   Hunger Vital Sign    Worried About Running Out of Food in the Last Year: Never true    Ran Out of Food in the Last Year: Never true  Transportation Needs: No Transportation Needs (11/27/2023)   PRAPARE - Administrator, Civil Service (Medical): No    Lack of Transportation (Non-Medical): No  Physical Activity: Inactive (11/27/2023)   Exercise Vital Sign    Days of Exercise per Week: 0 days    Minutes of Exercise per Session: 0 min  Stress: No Stress Concern Present (11/27/2023)   [employer] of Occupational Health - Occupational Stress Questionnaire    Feeling of Stress : Only a little  Social Connections: Socially Isolated (11/27/2023)   Social Connection and Isolation Panel [NHANES]    Frequency of Communication with Friends and Family: Twice a week    Frequency of Social Gatherings with Friends and Family: Three times a week    Attends Religious Services: Never    Active Member of Clubs or Organizations: No    Attends Banker Meetings: Never    Marital Status: Divorced  Catering manager Violence: Not At Risk (11/27/2023)   Humiliation, Afraid, Rape, and Kick questionnaire    Fear of Current or Ex-Partner: No    Emotionally Abused: No    Physically Abused: No    Sexually Abused: No    Physical Exam      Future Appointments  Date Time Provider Department Center  03/24/2024  2:30 PM Delma Freeze, FNP [facility] None  06/29/2024 12:45 PM [facility] None  07/01/2024 12:15 PM Rickard Patience, MD [facility] None  07/01/2024  1:00 PM [facility] INFUSION CHAIR 1 [facility] None  09/15/2024  9:50 AM Drema Dallas, DO  [facility] None  11/29/2024  1:00 PM [facility] ANNUAL WELLNESS VISIT 1 [facility]       Kerry Hough, Paramedic [phone] [facility] Paramedic  02/03/24

## 2024-02-04 ENCOUNTER — Encounter (HOSPITAL_COMMUNITY): Payer: Self-pay | Admitting: Adult Health

## 2024-02-04 ENCOUNTER — Other Ambulatory Visit: Payer: Self-pay | Admitting: Family

## 2024-02-04 DIAGNOSIS — I5022 Chronic systolic (congestive) heart failure: Secondary | ICD-10-CM

## 2024-02-04 NOTE — Progress Notes (Signed)
 Patient stable for discharge from paramedicine program. Family asks for referral for palliative care services. Referral placed to [facility] for [facility] care services. Referral back to the paramedicine program can be done if the need arises.

## 2024-02-04 NOTE — Progress Notes (Signed)
  HF [facility] Discharge  Discussed with HF Paramedic and HF Team.   Goals achieved.Can refer again if new needs identified. She was referred to Palliative Care. Family able to help with medications.   Stable for discharge. Patient aware.    Anees Vanecek NP-C  8:29 AM

## 2024-02-15 ENCOUNTER — Telehealth: Payer: Self-pay | Admitting: Family

## 2024-02-15 NOTE — Telephone Encounter (Signed)
 Need to r/s appt on 03/24/24

## 2024-02-21 ENCOUNTER — Other Ambulatory Visit (HOSPITAL_COMMUNITY): Payer: Self-pay | Admitting: Cardiology

## 2024-03-05 ENCOUNTER — Other Ambulatory Visit: Payer: Self-pay | Admitting: Family

## 2024-03-05 DIAGNOSIS — I5022 Chronic systolic (congestive) heart failure: Secondary | ICD-10-CM

## 2024-03-05 DIAGNOSIS — I10 Essential (primary) hypertension: Secondary | ICD-10-CM

## 2024-03-08 DIAGNOSIS — I509 Heart failure, unspecified: Secondary | ICD-10-CM | POA: Diagnosis not present

## 2024-03-08 DIAGNOSIS — I2510 Atherosclerotic heart disease of native coronary artery without angina pectoris: Secondary | ICD-10-CM | POA: Diagnosis not present

## 2024-03-08 DIAGNOSIS — G35 Multiple sclerosis: Secondary | ICD-10-CM | POA: Diagnosis not present

## 2024-03-08 DIAGNOSIS — J449 Chronic obstructive pulmonary disease, unspecified: Secondary | ICD-10-CM | POA: Diagnosis not present

## 2024-03-12 ENCOUNTER — Other Ambulatory Visit: Payer: Self-pay | Admitting: Cardiology

## 2024-03-24 ENCOUNTER — Encounter: Payer: Medicare Other | Admitting: Family

## 2024-03-24 ENCOUNTER — Telehealth: Payer: Self-pay | Admitting: Family

## 2024-03-24 NOTE — Telephone Encounter (Incomplete)
 Called to confirm/remind patient of their appointment [facility] on 03/25/24***.   Appointment:   [x] Confirmed  [] Left mess   [] No answer/No voice mail  [] Phone not in service  Patient reminded to bring all medications and/or complete list.  Confirmed patient has transportation. Gave directions, instructed to utilize valet parking.

## 2024-03-24 NOTE — Progress Notes (Signed)
 Advanced Heart Failure Clinic Note   PCP: Gabriel John, NP (last seen 10/24) Primary Cardiologist: Sheryle Donning, MD (last seen 10/23) HF cardiologist: Peder Bourdon, MD (last seen 05/24)  Chief Complaint: shortness of breath  HPI:  Anita [name] is a 63 y/o female with a history of CAD (occluded RCA with collaterals), SVT, COPD, GERD, multiple sclerosis, pulmonary HTN, cirrhosis likely secondary to RV failure, severe valvular disease, previous tobacco use, congestive hepatopathy and chronic heart failure.   Echo 03/20/20: EF 40-45% with moderately elevated PA pressure, mild LAE, moderate/ severe MR  LHC done 03/23/20 showed: Hemodynamics: LV end diastolic pressure is moderately elevated. ----Coronary Angiography----- Ost RCA to Dist RCA lesion is 100% stenosed.-The PDA and PL system fills via faint collaterals from the AV groove LCx and LAD septals. Mid LAD-1 lesion is 60% stenosed. Mid LAD-2 lesion is 50% stenosed. Dist LAD lesion is 55% stenosed with 70% stenosed side branch in 2nd Diag. 1st Diag lesion is 70% stenosed. Ost Cx to Prox Cx lesion is 45% stenosed. Prox-MID Cx lesion is 65% stenosed.  MODERATE-SEVERE THREE-VESSEL CAD: 100% proximal RCA occlusion with left-to-right collaterals faintly filling PDA and PL system (AV groove LCx-PL and LAD septal-PDA) Diffuse moderate mid LAD disease with 60% to 50% stenosis. Tandem 50% and 65% proximal and mid LCx with the 65% lesion being the most significant lesion. Moderately elevated LVEDP of 18-20 mmHg  Echo 02/07/21: EF of 40-45% along with severely elevated PA pressure of 69.2 mmHg, moderate LAE, severe MR, mild/ moderate Anita and moderate/severe TR.  Echo 08/22/22: EF of 30-35% along with mild LVH and severe MR/TR.   Admitted 09/16/22 due to SVT. Cardiology consult obtained. Given IV adenosine  and then amiodarone  drip. Lasix  gtt. Plan for EP consult. Discharged after 3 days.   TEE 03/18/23: EF 40% with moderate LAE/  severe RAE and severe MR with restricted posterior leaflet, peak RV-RA gradient 48 mmHg, severe TR. No PFO or ASD.   She presents today for a HF follow-up visit with a chief complaint of shortness of breath. Has associated fatigue and occasional right sided chest pain along with this. She denies palpitations, abdominal distention, pedal edema, dizziness, weight gain or difficulty sleeping. Involved with palliative care and will be getting frequent phone call visits with the nurse and home visits every 3-6 months. Overall she says that she feels "pretty good".   She has cirrhosis likely secondary to RV failure. Last paracenteses 12/14/23 with removal of 6.3 L.   ROS: All systems negative except as listed in HPI, PMH and Problem List.  SH:  Social History   Socioeconomic History   Marital status: Divorced    Spouse name: Not on file   Number of children: 2   Years of education: Not on file   Highest education level: Some college, no degree  Occupational History   Not on file  Tobacco Use   Smoking status: Former    Current packs/day: 1.00    Types: Cigarettes   Smokeless tobacco: Never  Vaping Use   Vaping status: Never Used  Substance and Sexual Activity   Alcohol use: Not Currently    Comment: Occasional   Drug use: Never   Sexual activity: Not on file  Other Topics Concern   Not on file  Social History Narrative   Right handed    Lives in one story home   Drinks Caffeine - Sweet Tea    Social Drivers of Health   Financial Resource Strain: Low Risk  (  11/27/2023)   Overall Financial Resource Strain (CARDIA)    Difficulty of Paying Living Expenses: Not hard at all  Food Insecurity: No Food Insecurity (11/27/2023)   Hunger Vital Sign    Worried About Running Out of Food in the Last Year: Never true    Ran Out of Food in the Last Year: Never true  Transportation Needs: No Transportation Needs (11/27/2023)   PRAPARE - Administrator, Civil Service (Medical): No     Lack of Transportation (Non-Medical): No  Physical Activity: Inactive (11/27/2023)   Exercise Vital Sign    Days of Exercise per Week: 0 days    Minutes of Exercise per Session: 0 min  Stress: No Stress Concern Present (11/27/2023)   [employer] of Occupational Health - Occupational Stress Questionnaire    Feeling of Stress : Only a little  Social Connections: Socially Isolated (11/27/2023)   Social Connection and Isolation Panel [NHANES]    Frequency of Communication with Friends and Family: Twice a week    Frequency of Social Gatherings with Friends and Family: Three times a week    Attends Religious Services: Never    Active Member of Clubs or Organizations: No    Attends Banker Meetings: Never    Marital Status: Divorced  Catering manager Violence: Not At Risk (11/27/2023)   Humiliation, Afraid, Rape, and Kick questionnaire    Fear of Current or Ex-Partner: No    Emotionally Abused: No    Physically Abused: No    Sexually Abused: No    FH:  Family History  Problem Relation Age of Onset   Skin cancer Mother    Lung cancer Father    Uterine cancer Sister    Heart disease Brother    Bipolar disorder Sister    Throat cancer Paternal Grandmother     Past Medical History:  Diagnosis Date   Abnormality of gait 07/26/2013   Acute kidney injury superimposed on stage IIIa chronic kidney disease (HCC) 03/20/2020   Acute on chronic combined systolic and diastolic CHF (congestive heart failure) (HCC) 10/08/2020   Acute on chronic HFrEF (heart failure with reduced ejection fraction) (HCC) 03/22/2020   Acute respiratory failure with hypoxia (HCC) 03/20/2020   Allergy    Arthritis    Atypical pneumonia 03/22/2020   CAD (coronary artery disease)    a. 03/2020 Cath: LM nl, LAD 60/62m, 55d, D1 70, D2 70, LCX 45ost/p, 65p, RCA 100ost CTO. RPDA fills via collats from dLAD. Inf septal fills via collats from 1st septal, RPAV fills via collats from LPAV, RPL1/2 sev  dzs-->med rx.   Chickenpox    Chronic combined systolic (congestive) and diastolic (congestive) heart failure (HCC)    a. 02/2021 Echo: EF 40-45%, glob HK, gr2 DD. Sev red RV fxn, RVSP 69.41mmHg. Mod dil LA, sev dil RA. Sev MR. Mild to mod Anita. Mean MV grad 6.56mmHg. Mod-Sev TR. Mild AI. Mild Ao sclerosis w/o stenosis.   COPD (chronic obstructive pulmonary disease) (HCC)    Elevated troponin 03/22/2020   GERD (gastroesophageal reflux disease)    Headache(784.0)    Migraine   Hearing loss    Right ear secondary to infection   History of shingles    Ischemic cardiomyopathy    a. 02/2021 Echo: EF 40-45%, glob HK.   Migraines    Multiple sclerosis (HCC)    Obese    Optic neuritis    PAH (pulmonary artery hypertension) (HCC)    a. 02/2021 Echo: RVSP 69.50mmHg.  Prolonged QT interval 03/20/2020   Severe mitral regurgitation    a. 02/2021 Echo: Severe MR w/ mild to mod Anita.  Mean MV grad 6.7mmHg.    Current Outpatient Medications  Medication Sig Dispense Refill   amiodarone  (PACERONE ) 100 MG tablet Take 1 tablet (100 mg total) by mouth daily. 90 tablet 3   aspirin  EC 81 MG tablet Take 1 tablet (81 mg total) by mouth daily. Swallow whole. 30 tablet 0   atorvastatin  (LIPITOR) 20 MG tablet Take 1 tablet daily 90 tablet 3   DULoxetine  (CYMBALTA ) 20 MG capsule TAKE 1 CAPSULE (20 MG TOTAL) BY MOUTH DAILY. FOR ANXIETY AND PAIN. 90 capsule 2   FARXIGA  10 MG TABS tablet TAKE 1 TABLET BY MOUTH DAILY BEFORE BREAKFAST. 30 tablet 11   feeding supplement, ENSURE ENLIVE, (ENSURE ENLIVE) LIQD Take 237 mLs by mouth 2 (two) times daily between meals.     ferrous sulfate  325 (65 FE) MG EC tablet Take 1 tablet (325 mg total) by mouth daily. 90 tablet 3   fluticasone -salmeterol (ADVAIR DISKUS) 250-50 MCG/ACT AEPB INHALE 1 PUFF INTO THE LUNGS TWICE A DAY 60 each 5   levothyroxine  (SYNTHROID ) 25 MCG tablet TAKE 1 TABLET BY MOUTH DAILY BEFORE BREAKFAST. 90 tablet 1   metoprolol  succinate (TOPROL -XL) 25 MG 24 hr tablet  TAKE 1 TABLET (25 MG TOTAL) BY MOUTH DAILY. 90 tablet 3   nitroGLYCERIN  (NITROSTAT ) 0.4 MG SL tablet Place 1 tablet (0.4 mg total) under the tongue every 5 (five) minutes as needed for chest pain. 20 tablet 5   omeprazole  (PRILOSEC) 40 MG capsule TAKE 1 CAPSULE (40 MG TOTAL) BY MOUTH DAILY. FOR HEARTBURN. (Patient taking differently: Take 40 mg by mouth at bedtime. For heartburn.) 90 capsule 2   Pediatric Multiple Vitamins (FLINTSTONES MULTIVITAMIN) CHEW Chew by mouth.     potassium chloride  SA (KLOR-CON  M20) 20 MEQ tablet Take 1 tablet  (20 MEQ) by mouth twice daily. 180 tablet 1   Probiotic Product (PROBIOTIC ADVANCED) CAPS Take 1 capsule by mouth daily.     spironolactone  (ALDACTONE ) 25 MG tablet Take 1 tablet (25 mg total) by mouth 2 (two) times daily. 180 tablet 3   torsemide  (DEMADEX ) 20 MG tablet TAKE 3 TABLETS (60 MG TOTAL) BY MOUTH 2 (TWO) TIMES DAILY. 540 tablet 1   trolamine salicylate (ASPERCREME) 10 % cream Apply 1 application  topically daily as needed for muscle pain (back or joint pain).     vitamin B-12 (CYANOCOBALAMIN ) 500 MCG tablet Take 500 mcg by mouth 4 (four) times a week. 4 times weekly no more than 2000 MCG per week     Current Facility-Administered Medications  Medication Dose Route Frequency Provider Last Rate Last Admin   albumin  human 25 % solution 25 g  25 g Intravenous Once [place] A, FNP       Vitals:   03/25/24 1439  BP: (!) 100/55  Pulse: (!) 57  SpO2: 99%  Weight: 139 lb (63 kg)   Wt Readings from Last 3 Encounters:  03/25/24 139 lb (63 kg)  02/03/24 133 lb (60.3 kg)  01/07/24 134 lb (60.8 kg)   Lab Results  Component Value Date   CREATININE 1.79 (H) 11/25/2023   CREATININE 1.95 (H) 09/23/2023   CREATININE 1.65 (H) 08/20/2023    PHYSICAL EXAM:  General: Well appearing. No resp difficulty HEENT: normal Neck: supple, no JVD Cor: Regular rhythm, bradycardic. No rubs, gallops. 2/6 HSM at apex Lungs: clear Abdomen: soft, nontender,  nondistended. Extremities:  no cyanosis, clubbing, rash, edema Neuro: alert & oriented X 3. Moves all 4 extremities w/o difficulty. Affect pleasant   ECG: not done   ASSESSMENT & PLAN:  1: Chronic heart failure with reduced ejection fraction- - suspect mixed ischemic and valvular disease - NYHA class III - euvolemic today - weighing daily; reminded to call for an overnight weight gain of > 2 pounds or a weekly weight gain of > 5 pounds - weight down 2 pounds since her last visit here 3 months ago - Echo 03/20/20: EF 40-45% with moderately elevated PA pressure, mild LAE, moderate/ severe MR - Echo 02/07/21: EF of 40-45% along with severely elevated PA pressure of 69.2 mmHg, moderate LAE, severe MR, mild/ moderate Anita and moderate/severe TR.  - Echo 08/22/22: EF of 30-35% along with mild LVH and severe MR/TR.  - TEE 03/18/23: EF 40% with moderate LAE/ severe RAE and severe MR with restricted posterior leaflet, peak RV-RA gradient 48 mmHg, severe TR. No PFO or ASD.  - not adding salt and daughter says that she doesn't cook with salt either - saw [facility] provider Mitzie Anda) 05/24 - continue farxiga  10mg  daily - continue metoprolol  succinate 25mg  daily - continue potassium 20meq BID - continue spironolactone  25mg  BID - continue torsemide  60mg  BID - had been hypotensive with entresto  in the past - BMET today - now with [facility] and is pleased with their service so far. Biweekly phone visit with the nurse and provider visits Q 3-6 months - BNP 08/20/23 was 1439.7  2: HTN- - BP 100/55 - saw PCP Fulton Job) 10/24 - BMP 11/25/23 reviewed: sodium 135, potassium 3.9, creatinine 1.79 & GFR 32  - BMET today  3: CAD- - saw cardiology Otho Blitz) 10/23 - continue atorvastatin  20mg  daily - continue ASA 81mg  daily - LHC done 03/23/20 showed: Hemodynamics: LV end diastolic pressure is moderately elevated. ----Coronary Angiography----- Ost RCA to Dist RCA lesion is 100% stenosed.-The PDA and PL system  fills via faint collaterals from the AV groove LCx and LAD septals. Mid LAD-1 lesion is 60% stenosed. Mid LAD-2 lesion is 50% stenosed. Dist LAD lesion is 55% stenosed with 70% stenosed side branch in 2nd Diag. 1st Diag lesion is 70% stenosed. Ost Cx to Prox Cx lesion is 45% stenosed. Prox-MID Cx lesion is 65% stenosed.  MODERATE-SEVERE THREE-VESSEL CAD: 100% proximal RCA occlusion with left-to-right collaterals faintly filling PDA and PL system (AV groove LCx-PL and LAD septal-PDA) Diffuse moderate mid LAD disease with 60% to 50% stenosis. Tandem 50% and 65% proximal and mid LCx with the 65% lesion being the most significant lesion. Moderately elevated LVEDP of 18-20 mmHg  4: NSVT- - continue amiodarone  100mg  daily - required adenosine  to terminate 10/23 - needs regular eye exams - TSH 05/22/23 was 14.093 - LDL 01/01/23 was 56  5: Cirrhosis w/ ascites- - suspect cardiogenic cirrhosis due to RV failure - periodic paracentesis; last one done 01/25 with removal of 6.3L; If the patient eventually requires >/=2 paracenteses in a 30 day period, candidacy for formal evaluation by the [facility] will be assessed - saw GI Antony Baumgartner) 01/24  6: Valvular heart disease- - TEE 03/18/23: EF 40% with moderate LAE/ severe RAE and severe MR. No PFO or ASD - does not qualifiy for mTEER due to cirrhosis, Anita and fraility. Dr Mitzie Anda discussed with structural heart team and even if it could be done, this would not fix her TR which is also a large contributing factor - now  with [facility]  7: Anemia- - saw hematology Wilhelmenia Harada) 01/25 - has received IV venofer  in the past - Hg 12/21/23 was 13.1   Return in 4 months, sooner if needed.   Anita Console, FNP 03/24/24

## 2024-03-25 ENCOUNTER — Ambulatory Visit: Attending: Family | Admitting: Family

## 2024-03-25 ENCOUNTER — Encounter: Payer: Self-pay | Admitting: Family

## 2024-03-25 VITALS — BP 100/55 | HR 57 | Wt 139.0 lb

## 2024-03-25 DIAGNOSIS — D508 Other iron deficiency anemias: Secondary | ICD-10-CM | POA: Diagnosis not present

## 2024-03-25 DIAGNOSIS — I10 Essential (primary) hypertension: Secondary | ICD-10-CM

## 2024-03-25 DIAGNOSIS — D649 Anemia, unspecified: Secondary | ICD-10-CM | POA: Insufficient documentation

## 2024-03-25 DIAGNOSIS — G35 Multiple sclerosis: Secondary | ICD-10-CM | POA: Insufficient documentation

## 2024-03-25 DIAGNOSIS — Z8249 Family history of ischemic heart disease and other diseases of the circulatory system: Secondary | ICD-10-CM | POA: Insufficient documentation

## 2024-03-25 DIAGNOSIS — J449 Chronic obstructive pulmonary disease, unspecified: Secondary | ICD-10-CM | POA: Insufficient documentation

## 2024-03-25 DIAGNOSIS — Z79899 Other long term (current) drug therapy: Secondary | ICD-10-CM | POA: Diagnosis not present

## 2024-03-25 DIAGNOSIS — I5022 Chronic systolic (congestive) heart failure: Secondary | ICD-10-CM | POA: Diagnosis not present

## 2024-03-25 DIAGNOSIS — R188 Other ascites: Secondary | ICD-10-CM | POA: Insufficient documentation

## 2024-03-25 DIAGNOSIS — K7460 Unspecified cirrhosis of liver: Secondary | ICD-10-CM | POA: Insufficient documentation

## 2024-03-25 DIAGNOSIS — Z87891 Personal history of nicotine dependence: Secondary | ICD-10-CM | POA: Insufficient documentation

## 2024-03-25 DIAGNOSIS — I340 Nonrheumatic mitral (valve) insufficiency: Secondary | ICD-10-CM | POA: Diagnosis not present

## 2024-03-25 DIAGNOSIS — I4729 Other ventricular tachycardia: Secondary | ICD-10-CM

## 2024-03-25 DIAGNOSIS — I110 Hypertensive heart disease with heart failure: Secondary | ICD-10-CM | POA: Diagnosis not present

## 2024-03-25 DIAGNOSIS — I2510 Atherosclerotic heart disease of native coronary artery without angina pectoris: Secondary | ICD-10-CM | POA: Insufficient documentation

## 2024-03-25 DIAGNOSIS — Z7982 Long term (current) use of aspirin: Secondary | ICD-10-CM | POA: Insufficient documentation

## 2024-03-25 DIAGNOSIS — I472 Ventricular tachycardia, unspecified: Secondary | ICD-10-CM | POA: Diagnosis not present

## 2024-03-25 NOTE — Patient Instructions (Signed)
 Lab Work:  Go DOWN to LOWER LEVEL (LL) to have your blood work completed inside of [employer] office.  We will only call you if the results are abnormal or if the provider would like to make medication changes.   Follow-Up in: 4 months with Ellouise Class, FNP.  At the [facility], you and your health needs are our priority. We have a designated team specialized in the treatment of Heart Failure. This Care Team includes your primary Heart Failure Specialized Cardiologist (physician), Advanced Practice Providers (APPs- Physician Assistants and Nurse Practitioners), and Pharmacist who all work together to provide you with the care you need, when you need it.   You may see any of the following providers on your designated Care Team at your next follow up:  Dr. Toribio Fuel Dr. Ezra Shuck Dr. Ria Commander Dr. Odis Brownie Ellouise Class, FNP Jaun Bash, RPH-CPP  Please be sure to bring in all your medications bottles to every appointment.   Need to Contact Us :  If you have any questions or concerns before your next appointment please send us  a message through [place] or call our office at [phone].    TO LEAVE A MESSAGE FOR THE NURSE SELECT OPTION 2, PLEASE LEAVE A MESSAGE INCLUDING: YOUR NAME DATE OF BIRTH CALL BACK NUMBER REASON FOR CALL**this is important as we prioritize the call backs  YOU WILL RECEIVE A CALL BACK THE SAME DAY AS LONG AS YOU CALL BEFORE 4:00 PM

## 2024-03-26 LAB — BASIC METABOLIC PANEL WITH GFR
BUN/Creatinine Ratio: 21 (ref 12–28)
BUN: 34 mg/dL — ABNORMAL HIGH (ref 8–27)
CO2: 17 mmol/L — ABNORMAL LOW (ref 20–29)
Calcium: 8.9 mg/dL (ref 8.7–10.3)
Chloride: 105 mmol/L (ref 96–106)
Creatinine, Ser: 1.63 mg/dL — ABNORMAL HIGH (ref 0.57–1.00)
Glucose: 82 mg/dL (ref 70–99)
Potassium: 5 mmol/L (ref 3.5–5.2)
Sodium: 137 mmol/L (ref 134–144)
eGFR: 36 mL/min/{1.73_m2} — ABNORMAL LOW (ref >59)

## 2024-03-27 ENCOUNTER — Other Ambulatory Visit: Payer: Self-pay | Admitting: Family

## 2024-03-27 ENCOUNTER — Encounter: Payer: Self-pay | Admitting: Family

## 2024-03-27 DIAGNOSIS — I5022 Chronic systolic (congestive) heart failure: Secondary | ICD-10-CM

## 2024-05-11 ENCOUNTER — Other Ambulatory Visit: Payer: Self-pay | Admitting: Cardiology

## 2024-06-29 ENCOUNTER — Inpatient Hospital Stay: Payer: Medicare Other | Attending: Oncology

## 2024-06-29 DIAGNOSIS — Z87891 Personal history of nicotine dependence: Secondary | ICD-10-CM | POA: Diagnosis not present

## 2024-06-29 DIAGNOSIS — Z801 Family history of malignant neoplasm of trachea, bronchus and lung: Secondary | ICD-10-CM | POA: Insufficient documentation

## 2024-06-29 DIAGNOSIS — Z8619 Personal history of other infectious and parasitic diseases: Secondary | ICD-10-CM | POA: Diagnosis not present

## 2024-06-29 DIAGNOSIS — G35 Multiple sclerosis: Secondary | ICD-10-CM | POA: Insufficient documentation

## 2024-06-29 DIAGNOSIS — I252 Old myocardial infarction: Secondary | ICD-10-CM | POA: Insufficient documentation

## 2024-06-29 DIAGNOSIS — K648 Other hemorrhoids: Secondary | ICD-10-CM | POA: Insufficient documentation

## 2024-06-29 DIAGNOSIS — R188 Other ascites: Secondary | ICD-10-CM | POA: Insufficient documentation

## 2024-06-29 DIAGNOSIS — Z79899 Other long term (current) drug therapy: Secondary | ICD-10-CM | POA: Insufficient documentation

## 2024-06-29 DIAGNOSIS — I5042 Chronic combined systolic (congestive) and diastolic (congestive) heart failure: Secondary | ICD-10-CM | POA: Diagnosis not present

## 2024-06-29 DIAGNOSIS — I2510 Atherosclerotic heart disease of native coronary artery without angina pectoris: Secondary | ICD-10-CM | POA: Diagnosis not present

## 2024-06-29 DIAGNOSIS — J449 Chronic obstructive pulmonary disease, unspecified: Secondary | ICD-10-CM | POA: Insufficient documentation

## 2024-06-29 DIAGNOSIS — Z7982 Long term (current) use of aspirin: Secondary | ICD-10-CM | POA: Insufficient documentation

## 2024-06-29 DIAGNOSIS — N1831 Chronic kidney disease, stage 3a: Secondary | ICD-10-CM | POA: Diagnosis present

## 2024-06-29 DIAGNOSIS — R110 Nausea: Secondary | ICD-10-CM | POA: Insufficient documentation

## 2024-06-29 DIAGNOSIS — D631 Anemia in chronic kidney disease: Secondary | ICD-10-CM | POA: Insufficient documentation

## 2024-06-29 DIAGNOSIS — Z8249 Family history of ischemic heart disease and other diseases of the circulatory system: Secondary | ICD-10-CM | POA: Insufficient documentation

## 2024-06-29 DIAGNOSIS — E538 Deficiency of other specified B group vitamins: Secondary | ICD-10-CM | POA: Diagnosis not present

## 2024-06-29 DIAGNOSIS — R1909 Other intra-abdominal and pelvic swelling, mass and lump: Secondary | ICD-10-CM | POA: Diagnosis not present

## 2024-06-29 DIAGNOSIS — R5383 Other fatigue: Secondary | ICD-10-CM | POA: Diagnosis not present

## 2024-06-29 DIAGNOSIS — I2721 Secondary pulmonary arterial hypertension: Secondary | ICD-10-CM | POA: Insufficient documentation

## 2024-06-29 DIAGNOSIS — Z808 Family history of malignant neoplasm of other organs or systems: Secondary | ICD-10-CM | POA: Insufficient documentation

## 2024-06-29 DIAGNOSIS — Z818 Family history of other mental and behavioral disorders: Secondary | ICD-10-CM | POA: Insufficient documentation

## 2024-06-29 DIAGNOSIS — I255 Ischemic cardiomyopathy: Secondary | ICD-10-CM | POA: Insufficient documentation

## 2024-06-29 DIAGNOSIS — Z888 Allergy status to other drugs, medicaments and biological substances status: Secondary | ICD-10-CM | POA: Diagnosis not present

## 2024-06-29 DIAGNOSIS — Z8049 Family history of malignant neoplasm of other genital organs: Secondary | ICD-10-CM | POA: Insufficient documentation

## 2024-06-29 DIAGNOSIS — Z885 Allergy status to narcotic agent status: Secondary | ICD-10-CM | POA: Insufficient documentation

## 2024-06-29 LAB — CBC WITH DIFFERENTIAL (CANCER CENTER ONLY)
Abs Immature Granulocytes: 0.01 K/uL (ref 0.00–0.07)
Basophils Absolute: 0.1 K/uL (ref 0.0–0.1)
Basophils Relative: 1 %
Eosinophils Absolute: 0.2 K/uL (ref 0.0–0.5)
Eosinophils Relative: 4 %
HCT: 39 % (ref 36.0–46.0)
Hemoglobin: 12.8 g/dL (ref 12.0–15.0)
Immature Granulocytes: 0 %
Lymphocytes Relative: 14 %
Lymphs Abs: 0.7 K/uL (ref 0.7–4.0)
MCH: 35.7 pg — ABNORMAL HIGH (ref 26.0–34.0)
MCHC: 32.8 g/dL (ref 30.0–36.0)
MCV: 108.6 fL — ABNORMAL HIGH (ref 80.0–100.0)
Monocytes Absolute: 0.6 K/uL (ref 0.1–1.0)
Monocytes Relative: 12 %
Neutro Abs: 3.5 K/uL (ref 1.7–7.7)
Neutrophils Relative %: 69 %
Platelet Count: 155 K/uL (ref 150–400)
RBC: 3.59 MIL/uL — ABNORMAL LOW (ref 3.87–5.11)
RDW: 13.1 % (ref 11.5–15.5)
WBC Count: 5.1 K/uL (ref 4.0–10.5)
nRBC: 0 % (ref 0.0–0.2)

## 2024-06-29 LAB — IRON AND TIBC
Iron: 87 ug/dL (ref 28–170)
Saturation Ratios: 26 % (ref 10.4–31.8)
TIBC: 336 ug/dL (ref 250–450)
UIBC: 249 ug/dL

## 2024-06-29 LAB — VITAMIN B12: Vitamin B-12: 531 pg/mL (ref 180–914)

## 2024-06-29 LAB — FERRITIN: Ferritin: 128 ng/mL (ref 11–307)

## 2024-07-01 ENCOUNTER — Inpatient Hospital Stay: Payer: Medicare Other | Admitting: Oncology

## 2024-07-01 ENCOUNTER — Inpatient Hospital Stay: Payer: Medicare Other

## 2024-07-01 ENCOUNTER — Encounter: Payer: Self-pay | Admitting: Oncology

## 2024-07-01 VITALS — BP 106/69 | HR 58 | Temp 97.8°F | Resp 16 | Wt 151.0 lb

## 2024-07-01 DIAGNOSIS — N1832 Chronic kidney disease, stage 3b: Secondary | ICD-10-CM

## 2024-07-01 DIAGNOSIS — I252 Old myocardial infarction: Secondary | ICD-10-CM | POA: Diagnosis not present

## 2024-07-01 DIAGNOSIS — D631 Anemia in chronic kidney disease: Secondary | ICD-10-CM | POA: Diagnosis not present

## 2024-07-01 DIAGNOSIS — N1831 Chronic kidney disease, stage 3a: Secondary | ICD-10-CM | POA: Diagnosis not present

## 2024-07-01 DIAGNOSIS — R1909 Other intra-abdominal and pelvic swelling, mass and lump: Secondary | ICD-10-CM | POA: Diagnosis not present

## 2024-07-01 DIAGNOSIS — E538 Deficiency of other specified B group vitamins: Secondary | ICD-10-CM | POA: Diagnosis not present

## 2024-07-01 DIAGNOSIS — I2721 Secondary pulmonary arterial hypertension: Secondary | ICD-10-CM | POA: Diagnosis not present

## 2024-07-01 NOTE — Assessment & Plan Note (Signed)
#  Anemia, combination of iron  deficiency as well as chronic kidney disease. Labs reviewed and discussed with patient. Lab Results  Component Value Date   HGB 12.8 06/29/2024   TIBC 336 06/29/2024   IRONPCTSAT 26 06/29/2024   FERRITIN 128 06/29/2024   Hemoglobin has normalized. I will hold off Venofer . Recommend patient to take oral iron  supplementation once daily.

## 2024-07-01 NOTE — Assessment & Plan Note (Signed)
 Encourage oral hydration and avoid nephrotoxins.

## 2024-07-01 NOTE — Assessment & Plan Note (Signed)
 continue vitamin B12  3 times per week.  B12 level is within normal limits.

## 2024-07-01 NOTE — Progress Notes (Signed)
 Hematology/Oncology Progress note [phone] [phone]      Patient Care Team: Gretta Comer POUR, NP as PCP - General (Internal Medicine) Lonni Slain, MD as PCP - Cardiology (Cardiology) Babara Call, MD as Consulting Physician (Oncology) Skeet Juliene SAUNDERS, DO as Consulting Physician (Neurology)  CHIEF COMPLAINTS/REASON FOR VISIT:  Follow up of anemia  ASSESSMENT & PLAN:   Anemia due to stage 3a chronic kidney disease (HCC) #Anemia, combination of iron  deficiency as well as chronic kidney disease. Labs reviewed and discussed with patient. Lab Results  Component Value Date   HGB 12.8 06/29/2024   TIBC 336 06/29/2024   IRONPCTSAT 26 06/29/2024   FERRITIN 128 06/29/2024   Hemoglobin has normalized. I will hold off Venofer . Recommend patient to take oral iron  supplementation once daily.    CKD (chronic kidney disease) stage 3, GFR 30-59 ml/min (HCC) Encourage oral hydration and avoid nephrotoxins.    Vitamin B12 deficiency continue vitamin B12  3 times per week.  B12 level is within normal limits.  Orders Placed This Encounter  Procedures   CBC with Differential/Platelet    Standing Status:   Future    Expected Date:   01/01/2025    Expiration Date:   04/01/2025   Iron  and TIBC    Standing Status:   Future    Expected Date:   01/01/2025    Expiration Date:   04/01/2025   Ferritin    Standing Status:   Future    Expected Date:   01/01/2025    Expiration Date:   04/01/2025   Vitamin B12    Standing Status:   Future    Expected Date:   01/01/2025    Expiration Date:   04/01/2025   Folate    Standing Status:   Future    Expected Date:   01/01/2025    Expiration Date:   04/01/2025   Hepatic function panel    Standing Status:   Future    Expected Date:   01/01/2025    Expiration Date:   04/01/2025   Follow-up in 6 months. All questions were answered. The patient knows to call the clinic with any problems, questions or concerns.  Call Babara, MD,  PhD [facility] Hematology Oncology 07/01/2024      HISTORY OF PRESENTING ILLNESS:  Anita [name] is a  63 y.o.  female with PMH listed below who was referred to me for evaluation of anemia Reviewed patient's recent labs that was done.  05/03/2020 labs revealed anemia with hemoglobin of 8.9, MCV 82.9 07/02/2020, iron  saturation 11, ferritin 91, TIBC 307, iron  34. Reviewed patient's previous labs , anemia is chronic onset , duration is since April 2021.  She did not have much baseline blood work prior to April 2021.  She did have some remote blood work that was done in 2012 when she had a normal hemoglobin of 13.5. Patient has multiple medical problems.  She is a poor historian.  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 Patient was hospitalized in April 2021 due to acute respiratory failure with hypoxia, due to atypical pneumonia, non-STEMI, severe three-vessel disease and pulmonary hypertension, AKI. 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 Melodye Coy in 2007.  Patient declined pursuing further diagnosis or therapeutic options. She also has a chronic history of MS.  Patient was recently seen by gastroenterology and had colonoscopy done. 5 mm transverse colon  polyp which was resected and retrieved.  Diverticulosis.  Nonbleeding internal hemorrhoids. Patient denies any bloody or black bowel movement.  She continues to have chronic nausea with no vomiting. Appetite is not good.  Weight has been relatively stable.  She also reports bilateral upper extremity ecchymosis which is a chronic issue for her.  + CHF, ascites, she gets therapeutic paracentesis.   INTERVAL HISTORY Anita [name] is a 63 y.o. female who has above history reviewed by me today presents for follow up visit for management of Anemia Patient was accompanied by daughter. + chronic fatigue.  Improved.  She  tolerated IV Venofer  treatments.   Review of Systems  Constitutional:  Positive for fatigue. Negative for appetite change, chills, fever and unexpected weight change.  HENT:   Negative for hearing loss and voice change.   Eyes:  Negative for eye problems.  Respiratory:  Negative for chest tightness and cough.   Cardiovascular:  Negative for chest pain.  Gastrointestinal:  Negative for abdominal distention, abdominal pain, blood in stool and nausea.  Endocrine: Negative for hot flashes.  Genitourinary:  Negative for difficulty urinating and frequency.   Musculoskeletal:  Negative for arthralgias.  Skin:  Negative for itching and rash.  Neurological:  Negative for extremity weakness.  Hematological:  Negative for adenopathy. Bruises/bleeds easily.  Psychiatric/Behavioral:  Negative for confusion.      MEDICAL HISTORY:  Past Medical History:  Diagnosis Date   Abnormality of gait 07/26/2013   Acute kidney injury superimposed on stage IIIa chronic kidney disease (HCC) 03/20/2020   Acute on chronic combined systolic and diastolic CHF (congestive heart failure) (HCC) 10/08/2020   Acute on chronic HFrEF (heart failure with reduced ejection fraction) (HCC) 03/22/2020   Acute respiratory failure with hypoxia (HCC) 03/20/2020   Allergy    Arthritis    Atypical pneumonia 03/22/2020   CAD (coronary artery disease)    a. 03/2020 Cath: LM nl, LAD 60/39m, 55d, D1 70, D2 70, LCX 45ost/p, 65p, RCA 100ost CTO. RPDA fills via collats from dLAD. Inf septal fills via collats from 1st septal, RPAV fills via collats from LPAV, RPL1/2 sev dzs-->med rx.   Chickenpox    Chronic combined systolic (congestive) and diastolic (congestive) heart failure (HCC)    a. 02/2021 Echo: EF 40-45%, glob HK, gr2 DD. Sev red RV fxn, RVSP 69.62mmHg. Mod dil LA, sev dil RA. Sev MR. Mild to mod MS. Mean MV grad 6.2mmHg. Mod-Sev TR. Mild AI. Mild Ao sclerosis w/o stenosis.   COPD (chronic obstructive pulmonary disease) (HCC)     Elevated troponin 03/22/2020   GERD (gastroesophageal reflux disease)    Headache(784.0)    Migraine   Hearing loss    Right ear secondary to infection   History of shingles    Ischemic cardiomyopathy    a. 02/2021 Echo: EF 40-45%, glob HK.   Migraines    Multiple sclerosis (HCC)    Obese    Optic neuritis    PAH (pulmonary artery hypertension) (HCC)    a. 02/2021 Echo: RVSP 69.48mmHg.   Prolonged QT interval 03/20/2020   Severe mitral regurgitation    a. 02/2021 Echo: Severe MR w/ mild to mod MS.  Mean MV grad 6.34mmHg.    SURGICAL HISTORY: Past Surgical History:  Procedure Laterality Date   COLONOSCOPY WITH PROPOFOL  N/A 08/03/2020   Procedure: COLONOSCOPY WITH PROPOFOL ;  Surgeon: Therisa Bi, MD;  Location[facility] ENDOSCOPY;  Service: Gastroenterology;  Laterality: N/A;   Ganglioneuroma     Resection  LEFT HEART CATH AND CORONARY ANGIOGRAPHY N/A 03/23/2020   Procedure: LEFT HEART CATH AND CORONARY ANGIOGRAPHY;  Surgeon: Anner Alm ORN, MD;  Location: [facility] INVASIVE CV LAB;  Service: Cardiovascular;  Laterality: N/A;   PILONIDAL CYST EXCISION     PILONIDAL CYST EXCISION  1983   TEE WITHOUT CARDIOVERSION N/A 03/18/2023   Procedure: TRANSESOPHAGEAL ECHOCARDIOGRAM (TEE);  Surgeon: Rolan Ezra RAMAN, MD;  Location: [facility] ORS;  Service: Cardiovascular;  Laterality: N/A;   TUMOR REMOVAL  2007/2008    SOCIAL HISTORY: Social History   Socioeconomic History   Marital status: Divorced    Spouse name: Not on file   Number of children: 2   Years of education: Not on file   Highest education level: Some college, no degree  Occupational History   Not on file  Tobacco Use   Smoking status: Former    Current packs/day: 1.00    Types: Cigarettes   Smokeless tobacco: Never  Vaping Use   Vaping status: Never Used  Substance and Sexual Activity   Alcohol use: Not Currently    Comment: Occasional   Drug use: Never   Sexual activity: Not on file  Other Topics Concern   Not on file  Social  History Narrative   Right handed    Lives in one story home   Drinks Caffeine - Sweet Tea    Social Drivers of Health   Financial Resource Strain: Low Risk  (11/27/2023)   Overall Financial Resource Strain (CARDIA)    Difficulty of Paying Living Expenses: Not hard at all  Food Insecurity: No Food Insecurity (11/27/2023)   Hunger Vital Sign    Worried About Running Out of Food in the Last Year: Never true    Ran Out of Food in the Last Year: Never true  Transportation Needs: No Transportation Needs (11/27/2023)   PRAPARE - Administrator, Civil Service (Medical): No    Lack of Transportation (Non-Medical): No  Physical Activity: Inactive (11/27/2023)   Exercise Vital Sign    Days of Exercise per Week: 0 days    Minutes of Exercise per Session: 0 min  Stress: No Stress Concern Present (11/27/2023)   [employer] of Occupational Health - Occupational Stress Questionnaire    Feeling of Stress : Only a little  Social Connections: Socially Isolated (11/27/2023)   Social Connection and Isolation Panel    Frequency of Communication with Friends and Family: Twice a week    Frequency of Social Gatherings with Friends and Family: Three times a week    Attends Religious Services: Never    Active Member of Clubs or Organizations: No    Attends Banker Meetings: Never    Marital Status: Divorced  Catering manager Violence: Not At Risk (11/27/2023)   Humiliation, Afraid, Rape, and Kick questionnaire    Fear of Current or Ex-Partner: No    Emotionally Abused: No    Physically Abused: No    Sexually Abused: No    FAMILY HISTORY: Family History  Problem Relation Age of Onset   Skin cancer Mother    Lung cancer Father    Uterine cancer Sister    Heart disease Brother    Bipolar disorder Sister    Throat cancer Paternal Grandmother     ALLERGIES:  is allergic to acthar  hp [corticotropin ], codeine, and prednisone.  MEDICATIONS:  Current Outpatient  Medications  Medication Sig Dispense Refill   amiodarone  (PACERONE ) 100 MG tablet Take 1 tablet (100 mg total) by  mouth daily. 90 tablet 3   aspirin  EC 81 MG tablet Take 1 tablet (81 mg total) by mouth daily. Swallow whole. 30 tablet 0   atorvastatin  (LIPITOR) 20 MG tablet Take 1 tablet daily 90 tablet 3   DULoxetine  (CYMBALTA ) 20 MG capsule TAKE 1 CAPSULE (20 MG TOTAL) BY MOUTH DAILY. FOR ANXIETY AND PAIN. 90 capsule 2   FARXIGA  10 MG TABS tablet TAKE 1 TABLET BY MOUTH DAILY BEFORE BREAKFAST. 30 tablet 11   feeding supplement, ENSURE ENLIVE, (ENSURE ENLIVE) LIQD Take 237 mLs by mouth 2 (two) times daily between meals.     ferrous sulfate  325 (65 FE) MG EC tablet Take 1 tablet (325 mg total) by mouth daily. 90 tablet 3   fluticasone -salmeterol (ADVAIR DISKUS) 250-50 MCG/ACT AEPB INHALE 1 PUFF INTO THE LUNGS TWICE A DAY 60 each 5   levothyroxine  (SYNTHROID ) 25 MCG tablet TAKE 1 TABLET BY MOUTH DAILY BEFORE BREAKFAST. 90 tablet 1   metoprolol  succinate (TOPROL -XL) 25 MG 24 hr tablet TAKE 1 TABLET (25 MG TOTAL) BY MOUTH DAILY. 90 tablet 3   nitroGLYCERIN  (NITROSTAT ) 0.4 MG SL tablet Place 1 tablet (0.4 mg total) under the tongue every 5 (five) minutes as needed for chest pain. 20 tablet 5   omeprazole  (PRILOSEC) 40 MG capsule TAKE 1 CAPSULE (40 MG TOTAL) BY MOUTH DAILY. FOR HEARTBURN. (Patient taking differently: Take 40 mg by mouth at bedtime. For heartburn.) 90 capsule 2   Pediatric Multiple Vitamins (FLINTSTONES MULTIVITAMIN) CHEW Chew by mouth.     potassium chloride  SA (KLOR-CON  M20) 20 MEQ tablet Take 1 tablet  (20 MEQ) by mouth twice daily. 180 tablet 1   Probiotic Product (PROBIOTIC ADVANCED) CAPS Take 1 capsule by mouth daily.     spironolactone  (ALDACTONE ) 25 MG tablet TAKE 1 TABLET BY MOUTH TWICE A DAY 90 tablet 3   torsemide  (DEMADEX ) 20 MG tablet TAKE 3 TABLETS (60 MG TOTAL) BY MOUTH 2 (TWO) TIMES DAILY. 540 tablet 1   trolamine salicylate (ASPERCREME) 10 % cream Apply 1 application   topically daily as needed for muscle pain (back or joint pain).     vitamin B-12 (CYANOCOBALAMIN ) 500 MCG tablet Take 500 mcg by mouth 4 (four) times a week. 4 times weekly no more than 2000 MCG per week     Current Facility-Administered Medications  Medication Dose Route Frequency Provider Last Rate Last Admin   albumin  human 25 % solution 25 g  25 g Intravenous Once [place] A, FNP         PHYSICAL EXAMINATION: ECOG PERFORMANCE STATUS: 2 - Symptomatic, <50% confined to bed Vitals:   07/01/24 1204  BP: 106/69  Pulse: (!) 58  Resp: 16  Temp: 97.8 F (36.6 C)  SpO2: 98%   Filed Weights   07/01/24 1204  Weight: 151 lb (68.5 kg)    Physical Exam Constitutional:      General: She is not in acute distress.    Comments: Patient sits in the wheelchair  HENT:     Head: Normocephalic and atraumatic.  Eyes:     General: No scleral icterus. Cardiovascular:     Rate and Rhythm: Normal rate and regular rhythm.     Heart sounds: Murmur heard.  Pulmonary:     Effort: Pulmonary effort is normal. No respiratory distress.     Breath sounds: No wheezing.     Comments: Decreased breath sound bilaterally. Abdominal:     General: Bowel sounds are normal. There is distension.  Palpations: Abdomen is soft.  Musculoskeletal:        General: No deformity. Normal range of motion.     Cervical back: Normal range of motion and neck supple.  Skin:    General: Skin is warm and dry.     Comments: Bilateral upper extremity ecchymosis.  Neurological:     Mental Status: She is alert and oriented to person, place, and time. Mental status is at baseline.  Psychiatric:        Mood and Affect: Mood normal.      LABORATORY DATA:  I have reviewed the data as listed    Latest Ref Rng & Units 06/29/2024   12:58 PM 12/21/2023   11:00 AM 07/28/2023   11:42 AM  CBC  WBC 4.0 - 10.5 K/uL 5.1  4.3  6.7   Hemoglobin 12.0 - 15.0 g/dL 87.1  86.8  89.4   Hematocrit 36.0 - 46.0 % 39.0  43.3  34.5    Platelets 150 - 400 K/uL 155  174  176       Latest Ref Rng & Units 03/25/2024    3:10 PM 11/25/2023    1:39 PM 09/23/2023   10:48 AM  CMP  Glucose 70 - 99 mg/dL 82  86  86   BUN 8 - 27 mg/dL 34  29  43   Creatinine 0.57 - 1.00 mg/dL 8.36  8.20  8.04   Sodium 134 - 144 mmol/L 137  135  137   Potassium 3.5 - 5.2 mmol/L 5.0  3.9  5.1   Chloride 96 - 106 mmol/L 105  108  106   CO2 20 - 29 mmol/L 17  18  17    Calcium  8.7 - 10.3 mg/dL 8.9  8.6  8.8      Iron /TIBC/Ferritin/ %Sat    Component Value Date/Time   IRON  87 06/29/2024 1258   IRON  39 12/25/2022 1416   TIBC 336 06/29/2024 1258   TIBC 288 12/25/2022 1416   FERRITIN 128 06/29/2024 1258   FERRITIN 152 (H) 12/25/2022 1416   IRONPCTSAT 26 06/29/2024 1258   IRONPCTSAT 14 (L) 12/25/2022 1416

## 2024-07-13 ENCOUNTER — Other Ambulatory Visit: Payer: Self-pay | Admitting: Cardiology

## 2024-07-13 ENCOUNTER — Other Ambulatory Visit: Payer: Self-pay | Admitting: Primary Care

## 2024-07-13 DIAGNOSIS — I5042 Chronic combined systolic (congestive) and diastolic (congestive) heart failure: Secondary | ICD-10-CM

## 2024-07-13 DIAGNOSIS — I5020 Unspecified systolic (congestive) heart failure: Secondary | ICD-10-CM

## 2024-07-13 DIAGNOSIS — I255 Ischemic cardiomyopathy: Secondary | ICD-10-CM

## 2024-07-13 DIAGNOSIS — K219 Gastro-esophageal reflux disease without esophagitis: Secondary | ICD-10-CM

## 2024-07-13 DIAGNOSIS — I471 Supraventricular tachycardia, unspecified: Secondary | ICD-10-CM

## 2024-07-13 DIAGNOSIS — I4729 Other ventricular tachycardia: Secondary | ICD-10-CM

## 2024-07-13 NOTE — Telephone Encounter (Signed)
Patient is due for follow up, this will be required prior to any further refills.  Please schedule, thank you!    

## 2024-07-21 ENCOUNTER — Telehealth: Payer: Self-pay | Admitting: Family

## 2024-07-21 NOTE — Progress Notes (Signed)
 Advanced [facility] Failure [facility] Note    PCP: Gretta Comer POUR, NP  Primary Cardiologist: Shelda Bruckner, MD (last seen 10/23) HF cardiologist: Rolan Barrack, MD (last seen 05/24)  Chief Complaint: shortness of breath   HPI:  Anita [name] is a 63 y/o female with a history of CAD (occluded RCA with collaterals), SVT, COPD, GERD, multiple sclerosis, pulmonary HTN, cirrhosis likely secondary to RV failure, severe valvular disease, previous tobacco use, congestive hepatopathy and chronic heart failure. She has cirrhosis likely secondary to RV failure.   Echo 03/20/20: EF 40-45% with moderately elevated PA pressure, mild LAE, moderate/ severe MR  LHC done 03/23/20 showed: Hemodynamics: LV end diastolic pressure is moderately elevated. ----Coronary Angiography----- Ost RCA to Dist RCA lesion is 100% stenosed.-The PDA and PL system fills via faint collaterals from the AV groove LCx and LAD septals. Mid LAD-1 lesion is 60% stenosed. Mid LAD-2 lesion is 50% stenosed. Dist LAD lesion is 55% stenosed with 70% stenosed side branch in 2nd Diag. 1st Diag lesion is 70% stenosed. Ost Cx to Prox Cx lesion is 45% stenosed. Prox-MID Cx lesion is 65% stenosed.  MODERATE-SEVERE THREE-VESSEL CAD: 100% proximal RCA occlusion with left-to-right collaterals faintly filling PDA and PL system (AV groove LCx-PL and LAD septal-PDA) Diffuse moderate mid LAD disease with 60% to 50% stenosis. Tandem 50% and 65% proximal and mid LCx with the 65% lesion being the most significant lesion. Moderately elevated LVEDP of 18-20 mmHg  Echo 02/07/21: EF of 40-45% along with severely elevated PA pressure of 69.2 mmHg, moderate LAE, severe MR, mild/ moderate Anita and moderate/severe TR.  Echo 08/22/22: EF of 30-35% along with mild LVH and severe MR/TR.   Admitted 09/16/22 due to SVT. Cardiology consult obtained. Given IV adenosine  and then amiodarone  drip. Lasix  gtt. Plan for EP consult. Discharged after 3 days.   TEE  03/18/23: EF 40% with moderate LAE/ severe RAE and severe MR with restricted posterior leaflet, peak RV-RA gradient 48 mmHg, severe TR. No PFO or ASD.   She presents today for a HF follow-up visit with a chief complaint of shortness of breath. Has associated fatigue, intermittent right sided chest pressure, stable dizziness, increased appetite and gradual weight gain due to increased appetite. Sleeping well although tends to sleep more during the day.   Last paracenteses 12/14/23 with removal of 6.3 L.   ROS: All systems negative except as listed in HPI, PMH and Problem List.   Labs (1/24): K 4.2, creatinine 1.69, AST/ALT/bilirubin normal, LDL 56 Labs (4/24): K 4.3, creatinine 1.82 Labs (5/24): hgb 9.7, plts 201, K 4.9, creatinine 1.78, LFTs normal, albumin  3.4, TSH normal, BNP 2261 Labs (6/24): K 4.2, creatinine 1.62 Labs (9/24): K 4.1, creatinine 1.65, BNP 1439.7 Labs (10/24): K 5.1, creatinine 1.95, TSH 10.66 Labs (12/24): K 3.9, creatinine 1.79 Labs (01/24) LDL 56 Labs (04/25): K 5.0, creatinine 1.63 Labs (07/25): Hg 12.8, PLT 155   PMH: 1. CAD: LHC in 4/21 with 60% mLAD, 70% D1, 65% mLCx, occluded proximal RCA with collaterals. Medically managed.  2. COPD 3. H/o SVT 4. Multiple sclerosis: Secondary progressive.  5. Anemia of CKD 6. CKD stage 3 7. B12 deficiency.  8. Chronic systolic CHF: Mixed ischemic and valvular cardiomyopathy.   - Echo (9/23): EF 30-35%, mild LVH, severe RV dysfunction with moderate RV enlargement, severe MR, severe TR.  - TEE (4/24): EF 40%, moderate RV enlargement with moderate systolic dysfunction, severe functional MR with restricted posterior leaflet, peak RV-RA gradient 48 mmHg, severe TR. 9. Valvular  heart disease: Severe MR and severe TR on 9/23 echo. Not candidate for surgical valve replacement.  - TEE (4/24): EF 40%, moderate RV enlargement with moderate systolic dysfunction, severe functional MR with restricted posterior leaflet, peak RV-RA gradient  48 mmHg, severe TR. 10. Cirrhosis: Suspect due to RV failure/hepatic congestion.  - H/o paracenteses.   SH:  Social History   Socioeconomic History   Marital status: Divorced    Spouse name: Not on file   Number of children: 2   Years of education: Not on file   Highest education level: Some college, no degree  Occupational History   Not on file  Tobacco Use   Smoking status: Former    Current packs/day: 1.00    Types: Cigarettes   Smokeless tobacco: Never  Vaping Use   Vaping status: Never Used  Substance and Sexual Activity   Alcohol use: Not Currently    Comment: Occasional   Drug use: Never   Sexual activity: Not on file  Other Topics Concern   Not on file  Social History Narrative   Right handed    Lives in one story home   Drinks Caffeine - Sweet Tea    Social Drivers of Health   Financial Resource Strain: Low Risk  (11/27/2023)   Overall Financial Resource Strain (CARDIA)    Difficulty of Paying Living Expenses: Not hard at all  Food Insecurity: No Food Insecurity (11/27/2023)   Hunger Vital Sign    Worried About Running Out of Food in the Last Year: Never true    Ran Out of Food in the Last Year: Never true  Transportation Needs: No Transportation Needs (11/27/2023)   PRAPARE - Administrator, Civil Service (Medical): No    Lack of Transportation (Non-Medical): No  Physical Activity: Inactive (11/27/2023)   Exercise Vital Sign    Days of Exercise per Week: 0 days    Minutes of Exercise per Session: 0 min  Stress: No Stress Concern Present (11/27/2023)   [employer] of Occupational Health - Occupational Stress Questionnaire    Feeling of Stress : Only a little  Social Connections: Socially Isolated (11/27/2023)   Social Connection and Isolation Panel    Frequency of Communication with Friends and Family: Twice a week    Frequency of Social Gatherings with Friends and Family: Three times a week    Attends Religious Services: Never     Active Member of Clubs or Organizations: No    Attends Banker Meetings: Never    Marital Status: Divorced  Catering manager Violence: Not At Risk (11/27/2023)   Humiliation, Afraid, Rape, and Kick questionnaire    Fear of Current or Ex-Partner: No    Emotionally Abused: No    Physically Abused: No    Sexually Abused: No    FH:  Family History  Problem Relation Age of Onset   Skin cancer Mother    Lung cancer Father    Uterine cancer Sister    Heart disease Brother    Bipolar disorder Sister    Throat cancer Paternal Grandmother      Current Outpatient Medications  Medication Sig Dispense Refill   amiodarone  (PACERONE ) 100 MG tablet Take 1 tablet (100 mg total) by mouth daily. 90 tablet 3   aspirin  EC 81 MG tablet Take 1 tablet (81 mg total) by mouth daily. Swallow whole. 30 tablet 0   atorvastatin  (LIPITOR) 20 MG tablet Take 1 tablet daily 90 tablet 3   DULoxetine  (  CYMBALTA ) 20 MG capsule TAKE 1 CAPSULE (20 MG TOTAL) BY MOUTH DAILY. FOR ANXIETY AND PAIN. 90 capsule 2   FARXIGA  10 MG TABS tablet TAKE 1 TABLET BY MOUTH DAILY BEFORE BREAKFAST. 30 tablet 11   feeding supplement, ENSURE ENLIVE, (ENSURE ENLIVE) LIQD Take 237 mLs by mouth 2 (two) times daily between meals.     ferrous sulfate  325 (65 FE) MG EC tablet Take 1 tablet (325 mg total) by mouth daily. 90 tablet 3   fluticasone -salmeterol (ADVAIR DISKUS) 250-50 MCG/ACT AEPB INHALE 1 PUFF INTO THE LUNGS TWICE A DAY 60 each 5   levothyroxine  (SYNTHROID ) 25 MCG tablet TAKE 1 TABLET BY MOUTH DAILY BEFORE BREAKFAST. 90 tablet 1   metoprolol  succinate (TOPROL -XL) 25 MG 24 hr tablet TAKE 1 TABLET (25 MG TOTAL) BY MOUTH DAILY. 90 tablet 3   nitroGLYCERIN  (NITROSTAT ) 0.4 MG SL tablet Place 1 tablet (0.4 mg total) under the tongue every 5 (five) minutes as needed for chest pain. 20 tablet 5   omeprazole  (PRILOSEC) 40 MG capsule TAKE 1 CAPSULE (40 MG TOTAL) BY MOUTH DAILY. FOR HEARTBURN. 90 capsule 0   Pediatric Multiple  Vitamins (FLINTSTONES MULTIVITAMIN) CHEW Chew by mouth.     potassium chloride  SA (KLOR-CON  M) 20 MEQ tablet TAKE 1 TABLET (20 MEQ) BY MOUTH TWICE DAILY. 180 tablet 1   Probiotic Product (PROBIOTIC ADVANCED) CAPS Take 1 capsule by mouth daily.     spironolactone  (ALDACTONE ) 25 MG tablet TAKE 1 TABLET BY MOUTH TWICE A DAY 90 tablet 3   torsemide  (DEMADEX ) 20 MG tablet TAKE 3 TABLETS (60 MG TOTAL) BY MOUTH 2 (TWO) TIMES DAILY. 540 tablet 1   trolamine salicylate (ASPERCREME) 10 % cream Apply 1 application  topically daily as needed for muscle pain (back or joint pain).     vitamin B-12 (CYANOCOBALAMIN ) 500 MCG tablet Take 500 mcg by mouth 4 (four) times a week. 4 times weekly no more than 2000 MCG per week     Current Facility-Administered Medications  Medication Dose Route Frequency Provider Last Rate Last Admin   albumin  human 25 % solution 25 g  25 g Intravenous Once [place] A, FNP       Vitals:   07/22/24 1419  BP: 124/74  Pulse: 60  SpO2: 97%  Weight: 155 lb 9.6 oz (70.6 kg)  Height: 5' 3 (1.6 m)   Wt Readings from Last 3 Encounters:  07/22/24 155 lb 9.6 oz (70.6 kg)  07/01/24 151 lb (68.5 kg)  03/25/24 139 lb (63 kg)   Lab Results  Component Value Date   CREATININE 1.63 (H) 03/25/2024   CREATININE 1.79 (H) 11/25/2023   CREATININE 1.95 (H) 09/23/2023    PHYSICAL EXAM:  General: Frail elderly female in wheelchair  Cor: No JVD. Regular rhythm, rate, 2/6 HSM at apex Lungs: clear Abdomen: soft, nontender, nondistended. Extremities: no edema Neuro:. Affect pleasant   ECG: not done   ASSESSMENT & PLAN:  1: Chronic heart failure with reduced ejection fraction- - suspect mixed ischemic and valvular disease - NYHA class III - euvolemic today - weight up 16 pounds since her last visit here 4 months ago. Admits that she's eating all the time. Has recently been eating burgers every day. Reviewed decreasing caloric intake and increasing activity - Echo 03/20/20: EF  40-45% with moderately elevated PA pressure, mild LAE, moderate/ severe MR - Echo 02/07/21: EF of 40-45% along with severely elevated PA pressure of 69.2 mmHg, moderate LAE, severe MR, mild/ moderate Anita and  moderate/severe TR.  - Echo 08/22/22: EF of 30-35% along with mild LVH and severe MR/TR.  - TEE 03/18/23: EF 40% with moderate LAE/ severe RAE and severe MR with restricted posterior leaflet, peak RV-RA gradient 48 mmHg, severe TR. No PFO or ASD.  - will get echo updated - not adding salt and daughter says that she doesn't cook with salt either - saw [facility] provider Ellena) 05/24 - continue farxiga  10mg  daily - continue metoprolol  succinate 25mg  daily - continue potassium 20meq BID - continue spironolactone  25mg  BID - continue torsemide  60mg  BID - had been hypotensive with entresto  in the past - now with [facility] and is pleased with their service so far. Biweekly phone visit with the nurse and provider visits Q 3-6 months - BNP 08/20/23 was 1439.7  2: HTN- - BP 124/74 - saw PCP Domenick) 10/24 - BMP 03/25/24 reviewed: sodium 137, potassium 5.0, creatinine 1.63 & GFR 36  - BMET today  3: CAD- - saw cardiology Frieda) 10/23 - continue ASA 81mg  daily - LHC done 03/23/20 showed: Hemodynamics: LV end diastolic pressure is moderately elevated. ----Coronary Angiography----- Ost RCA to Dist RCA lesion is 100% stenosed.-The PDA and PL system fills via faint collaterals from the AV groove LCx and LAD septals. Mid LAD-1 lesion is 60% stenosed. Mid LAD-2 lesion is 50% stenosed. Dist LAD lesion is 55% stenosed with 70% stenosed side branch in 2nd Diag. 1st Diag lesion is 70% stenosed. Ost Cx to Prox Cx lesion is 45% stenosed. Prox-MID Cx lesion is 65% stenosed.  MODERATE-SEVERE THREE-VESSEL CAD: 100% proximal RCA occlusion with left-to-right collaterals faintly filling PDA and PL system (AV groove LCx-PL and LAD septal-PDA) Diffuse moderate mid LAD disease with 60% to 50% stenosis. Tandem  50% and 65% proximal and mid LCx with the 65% lesion being the most significant lesion. Moderately elevated LVEDP of 18-20 mmHg  4: NSVT- - continue amiodarone  100mg  daily - required adenosine  to terminate 10/23 - needs regular eye exams - TSH 09/16/23 was 10.66. TSH today  5: Cirrhosis w/ ascites- - suspect cardiogenic cirrhosis due to RV failure - periodic paracentesis; last one done 01/25 with removal of 6.3L; If the patient eventually requires >/=2 paracenteses in a 30 day period, candidacy for formal evaluation by the [facility] will be assessed - saw GI Romero) 01/24 - LFT's today  6: Valvular heart disease- - TEE 03/18/23: EF 40% with moderate LAE/ severe RAE and severe MR. No PFO or ASD - does not qualifiy for mTEER due to cirrhosis, Anita and fraility. Dr Rolan discussed with structural heart team and even if it could be done, this would not fix her TR which is also a large contributing factor - now with [facility]  7: Anemia- - saw hematology Milda) [phone] - has received IV venofer  in the past - Hg 06/29/24 was 12.8  8: HLD- - lipid panel today - continue atorvastatin  20mg  daily   Follow-up in 3 months with HF MD, sooner if needed.   Anita [name] Class, FNP 07/21/24

## 2024-07-21 NOTE — Telephone Encounter (Signed)
 Called to confirm/remind patient of their appointment at the [facility] Clinic on 07/22/24.   Appointment:   [x] Confirmed  [] Left mess   [] No answer/No voice mail  [] VM Full/unable to leave message  [] Phone not in service  Patient reminded to bring all medications and/or complete list.  Confirmed patient has transportation. Gave directions, instructed to utilize valet parking.

## 2024-07-22 ENCOUNTER — Other Ambulatory Visit: Payer: Self-pay | Admitting: Family

## 2024-07-22 ENCOUNTER — Other Ambulatory Visit
Admission: RE | Admit: 2024-07-22 | Discharge: 2024-07-22 | Disposition: A | Discharge status: Home / Self Care | Source: Ambulatory Visit | Attending: Family | Admitting: Family

## 2024-07-22 ENCOUNTER — Ambulatory Visit: Payer: Self-pay | Admitting: Family

## 2024-07-22 ENCOUNTER — Ambulatory Visit (HOSPITAL_BASED_OUTPATIENT_CLINIC_OR_DEPARTMENT_OTHER): Admitting: Family

## 2024-07-22 ENCOUNTER — Encounter: Payer: Self-pay | Admitting: Family

## 2024-07-22 VITALS — BP 124/74 | HR 60 | Ht 63.0 in | Wt 155.6 lb

## 2024-07-22 DIAGNOSIS — I4729 Other ventricular tachycardia: Secondary | ICD-10-CM | POA: Insufficient documentation

## 2024-07-22 DIAGNOSIS — I2510 Atherosclerotic heart disease of native coronary artery without angina pectoris: Secondary | ICD-10-CM

## 2024-07-22 DIAGNOSIS — K7460 Unspecified cirrhosis of liver: Secondary | ICD-10-CM

## 2024-07-22 DIAGNOSIS — I5022 Chronic systolic (congestive) heart failure: Secondary | ICD-10-CM

## 2024-07-22 DIAGNOSIS — E782 Mixed hyperlipidemia: Secondary | ICD-10-CM

## 2024-07-22 DIAGNOSIS — I340 Nonrheumatic mitral (valve) insufficiency: Secondary | ICD-10-CM

## 2024-07-22 DIAGNOSIS — D508 Other iron deficiency anemias: Secondary | ICD-10-CM

## 2024-07-22 DIAGNOSIS — R188 Other ascites: Secondary | ICD-10-CM | POA: Diagnosis not present

## 2024-07-22 DIAGNOSIS — I10 Essential (primary) hypertension: Secondary | ICD-10-CM | POA: Diagnosis not present

## 2024-07-22 LAB — HEPATIC FUNCTION PANEL
ALT: 18 U/L (ref 0–44)
AST: 20 U/L (ref 15–41)
Albumin: 3.8 g/dL (ref 3.5–5.0)
Alkaline Phosphatase: 119 U/L (ref 38–126)
Bilirubin, Direct: <0.1 mg/dL (ref 0.0–0.2)
Total Bilirubin: 0.9 mg/dL (ref 0.0–1.2)
Total Protein: 7.7 g/dL (ref 6.5–8.1)

## 2024-07-22 LAB — BASIC METABOLIC PANEL WITH GFR
Anion gap: 9 (ref 5–15)
BUN: 43 mg/dL — ABNORMAL HIGH (ref 8–23)
CO2: 19 mmol/L — ABNORMAL LOW (ref 22–32)
Calcium: 9.2 mg/dL (ref 8.9–10.3)
Chloride: 106 mmol/L (ref 98–111)
Creatinine, Ser: 1.85 mg/dL — ABNORMAL HIGH (ref 0.44–1.00)
GFR, Estimated: 31 mL/min — ABNORMAL LOW (ref >60)
Glucose, Bld: 91 mg/dL (ref 70–99)
Potassium: 4.8 mmol/L (ref 3.5–5.1)
Sodium: 134 mmol/L — ABNORMAL LOW (ref 135–145)

## 2024-07-22 LAB — LIPID PANEL
Cholesterol: 162 mg/dL (ref 0–200)
HDL: 44 mg/dL (ref >40)
LDL Cholesterol: 103 mg/dL — ABNORMAL HIGH (ref 0–99)
Total CHOL/HDL Ratio: 3.7 ratio
Triglycerides: 73 mg/dL (ref <150)
VLDL: 15 mg/dL (ref 0–40)

## 2024-07-22 LAB — TSH: TSH: 10.705 u[IU]/mL — ABNORMAL HIGH (ref 0.350–4.500)

## 2024-07-22 MED ORDER — ATORVASTATIN CALCIUM 40 MG PO TABS: ORAL_TABLET | ORAL | Status: DC

## 2024-07-22 NOTE — Patient Instructions (Addendum)
 It was good to see you today!  If you receive a satisfaction survey regarding the Heart Failure Clinic, please take the time to fill it out. This way we can continue to provide excellent care and make any changes that need to be made.   Medication Changes:  No medication changes today!  Lab Work:  Go over to the [facility]. Go pass the gift shop and have your blood work completed.  We will only call you if the results are abnormal or if the provider would like to make medication changes.  No news is good news.   Testing/Procedures:  Your physician has requested that you have an echocardiogram. Echocardiography is a painless test that uses sound waves to create images of your heart. It provides your doctor with information about the size and shape of your heart and how well your heart's chambers and valves are working. This procedure takes approximately one hour. There are no restrictions for this procedure. Please do NOT wear cologne, perfume, aftershave, or lotions (deodorant is allowed). Please arrive 15 minutes prior to your appointment time.  Please note: We ask at that you not bring children with you during ultrasound (echo/ vascular) testing. Due to room size and safety concerns, children are not allowed in the ultrasound rooms during exams. Our front office staff cannot provide observation of children in our lobby area while testing is being conducted. An adult accompanying a patient to their appointment will only be allowed in the ultrasound room at the discretion of the ultrasound technician under special circumstances. We apologize for any inconvenience.   Follow-Up in: Please follow up with the Advanced Heart Failure Clinic in 3 months with Dr. Rolan. We do not currently have that schedule. Please give us  a call in October in order to schedule your appointment for November.    Thank you for choosing [facility].    At the Advanced  Heart Failure Clinic, you and your health needs are our priority. We have a designated team specialized in the treatment of Heart Failure. This Care Team includes your primary Heart Failure Specialized Cardiologist (physician), Advanced Practice Providers (APPs- Physician Assistants and Nurse Practitioners), and Pharmacist who all work together to provide you with the care you need, when you need it.   You may see any of the following providers on your designated Care Team at your next follow up:  Dr. Toribio Fuel Dr. Ezra Rolan Dr. Ria Commander Dr. Morene Brownie Ellouise Class, FNP Jaun Bash, RPH-CPP  Please be sure to bring in all your medications bottles to every appointment.   Need to Contact Us :  If you have any questions or concerns before your next appointment please send us  a message through [place] or call our office at [phone].    TO LEAVE A MESSAGE FOR THE NURSE SELECT OPTION 2, PLEASE LEAVE A MESSAGE INCLUDING: YOUR NAME DATE OF BIRTH CALL BACK NUMBER REASON FOR CALL**this is important as we prioritize the call backs  YOU WILL RECEIVE A CALL BACK THE SAME DAY AS LONG AS YOU CALL BEFORE 4:00 PM

## 2024-08-26 ENCOUNTER — Ambulatory Visit: Admitting: Primary Care

## 2024-08-26 ENCOUNTER — Encounter: Payer: Self-pay | Admitting: Primary Care

## 2024-08-26 VITALS — BP 114/76 | HR 59 | Temp 97.2°F | Ht 63.0 in | Wt 159.0 lb

## 2024-08-26 DIAGNOSIS — I5020 Unspecified systolic (congestive) heart failure: Secondary | ICD-10-CM

## 2024-08-26 DIAGNOSIS — R188 Other ascites: Secondary | ICD-10-CM | POA: Diagnosis not present

## 2024-08-26 DIAGNOSIS — I130 Hypertensive heart and chronic kidney disease with heart failure and stage 1 through stage 4 chronic kidney disease, or unspecified chronic kidney disease: Secondary | ICD-10-CM

## 2024-08-26 DIAGNOSIS — N1832 Chronic kidney disease, stage 3b: Secondary | ICD-10-CM | POA: Diagnosis not present

## 2024-08-26 DIAGNOSIS — G43701 Chronic migraine without aura, not intractable, with status migrainosus: Secondary | ICD-10-CM

## 2024-08-26 DIAGNOSIS — K7460 Unspecified cirrhosis of liver: Secondary | ICD-10-CM | POA: Diagnosis not present

## 2024-08-26 DIAGNOSIS — I2510 Atherosclerotic heart disease of native coronary artery without angina pectoris: Secondary | ICD-10-CM | POA: Diagnosis not present

## 2024-08-26 DIAGNOSIS — K219 Gastro-esophageal reflux disease without esophagitis: Secondary | ICD-10-CM

## 2024-08-26 DIAGNOSIS — J432 Centrilobular emphysema: Secondary | ICD-10-CM

## 2024-08-26 DIAGNOSIS — I471 Supraventricular tachycardia, unspecified: Secondary | ICD-10-CM | POA: Diagnosis not present

## 2024-08-26 DIAGNOSIS — E039 Hypothyroidism, unspecified: Secondary | ICD-10-CM | POA: Diagnosis not present

## 2024-08-26 DIAGNOSIS — G35 Multiple sclerosis: Secondary | ICD-10-CM

## 2024-08-26 DIAGNOSIS — I10 Essential (primary) hypertension: Secondary | ICD-10-CM

## 2024-08-26 DIAGNOSIS — D508 Other iron deficiency anemias: Secondary | ICD-10-CM

## 2024-08-26 DIAGNOSIS — E876 Hypokalemia: Secondary | ICD-10-CM | POA: Diagnosis not present

## 2024-08-26 DIAGNOSIS — Z1231 Encounter for screening mammogram for malignant neoplasm of breast: Secondary | ICD-10-CM

## 2024-08-26 NOTE — Assessment & Plan Note (Signed)
 Controlled.  We discussed when to resume inhalers and to start with albuterol  first. We discussed that if she required recurrent use of her butyryl inhaler to start the Advair again.

## 2024-08-26 NOTE — Assessment & Plan Note (Signed)
 No levothyroxine  in several months. Reviewed TSH from August 2025 which is above goal.  Discussed to resume levothyroxine  25 mcg daily.  We discussed appropriate instructions for administration.  Will recheck TSH in 2 months.

## 2024-08-26 NOTE — Assessment & Plan Note (Signed)
 Following with cardiology and GI.  Exam today stable. Continue torsemide  60 mg daily, spironolactone  25 mg daily

## 2024-08-26 NOTE — Assessment & Plan Note (Signed)
 Controlled.  Continue amiodarone  100 mg daily, metoprolol  succinate 25 mg daily.

## 2024-08-26 NOTE — Assessment & Plan Note (Signed)
 Controlled.  Continue metoprolol  succinate 25 mg daily, spironolactone  25 mg daily, torsemide  60 mg daily. Labs reviewed from July and August 2025.

## 2024-08-26 NOTE — Assessment & Plan Note (Signed)
 Reviewed labs from July and August 2025.  Continue Farxiga  10 mg daily, blood pressure control

## 2024-08-26 NOTE — Assessment & Plan Note (Signed)
 Controlled.  Continue omeprazole 40 mg daily.

## 2024-08-26 NOTE — Assessment & Plan Note (Signed)
 Gradually progressing.  Following with neurology, office notes reviewed from October 2024. She will follow-up in October as scheduled

## 2024-08-26 NOTE — Assessment & Plan Note (Signed)
 Following with hematology, office labs and notes reviewed from July 2025.

## 2024-08-26 NOTE — Assessment & Plan Note (Signed)
 Overall minimal fluid to abdomen.  Following with cardiology, office notes reviewed heart failure clinic office notes from August 2025.  Continue torsemide  60 mg BID. Continue amiodarone  100 mg.

## 2024-08-26 NOTE — Assessment & Plan Note (Signed)
 Stable.  Continue potassium chloride  20 mEq twice daily.

## 2024-08-26 NOTE — Assessment & Plan Note (Signed)
 Asymptomatic.  Continue atorvastatin  40 mg daily, aspirin  81 mg daily, blood pressure control.

## 2024-08-26 NOTE — Patient Instructions (Signed)
 Be sure to take your levothyroxine  (thyroid  medication) every morning on an empty stomach with water only.   No food or other medications for 30 minutes.   No heartburn medication, iron  pills, calcium , vitamin D, or magnesium  pills within four hours of taking levothyroxine .   For ear fullness: Try using Flonase (fluticasone ) nasal spray. Instill 1 spray in each nostril twice daily.   Shortness of Breath/Wheezing/Cough: Use the albuterol  inhaler. Inhale 2 puffs into the lungs every 4 to 6 hours as needed for wheezing, cough, and/or shortness of breath.   Start using the purple Advair inhaler if you require the rescue albuterol  inhaler more than 3 times weekly for several weeks.  Schedule a lab only appointment for 2 months to recheck your thyroid  levels.  It was a pleasure to see you today!

## 2024-08-26 NOTE — Assessment & Plan Note (Signed)
 No concerns today. Continue to monitor.

## 2024-08-26 NOTE — Progress Notes (Signed)
 Subjective:    Patient ID: Anita [name], female    DOB: 06/01/1961, 63 y.o.   MRN: [id number]  Anita [name] is a very pleasant 63 y.o. female with a significant medical history including migraines, hypertension, ischemic cardiomyopathy, CAD, heart failure, COPD, hypoxia, cirrhosis of the liver, CKD, generalized weakness, multiple sclerosis who presents today for follow-up of chronic conditions.  Her daughter joined us  today who helps to provide information for HPI.   Immunizations: -Tetanus: Declines  -Influenza: Declines influenza vaccine.  -Shingles: Never completed, declines -Pneumonia: Never completed, declines    Mammogram: Completed several years ago.    Colonoscopy: Completed in 2021, due 2026  1) Multiple Sclerosis: Currently now on palliative care. She uses a walker in the house, wheelchair in public. Not on treatment for MS, follows with neurology, next office visit is in October.   2) CAD/CHF/NSVT: Following with heart failure clinic, last office visit was in August 2025. Currently managed on Farxiga  10 mg daily, metoprolol  succinate 25 mg daily, spironolactone  25 mg twice daily, potassium 20 mEq twice daily, torsemide  60 mg twice daily, amiodarone  100 mg daily, atorvastatin  40 mg daily.  3) Anemia/B12 deficiency: Following with hematology, last office visit was July 2025.  During her last visit IV iron  was not indicated and she was encouraged to continue oral iron  supplements daily.  She was also advised to continue B12 vitamins 3 times weekly and to start folic acid  daily.   She denies vaginal and rectal bleeding.   4) Anxiety: Chronic. Currently managed on Cymbalta  20 mg daily. This has helped with overeating and anxiety. Overall feels well managed.   5) COPD: Currently managed on Advair Diskus 250-50 mcg, 1 puff BID and albuterol  inhaler PRN. No recent use of either inhaler in months. She denies cough, increased shortness of breath.   6)  Hypothyroidism: Currently prescribed levothyroxine  25 mcg daily. She's not taken her levothyroxine  pill in months. She did not notify her daughter that she needed her pill box refilled. Her daughter has plenty of levothyroxine  pills at home.   BP Readings from Last 3 Encounters:  08/26/24 114/76  07/22/24 124/74  07/01/24 106/69     Review of Systems  HENT:  Negative for congestion.   Respiratory:  Negative for shortness of breath.   Cardiovascular:  Negative for chest pain.  Gastrointestinal:  Negative for constipation and diarrhea.  Neurological:  Negative for headaches.  Psychiatric/Behavioral:  The patient is not nervous/anxious.          Past Medical History:  Diagnosis Date   Abnormality of gait 07/26/2013   Acute kidney injury superimposed on stage IIIa chronic kidney disease (HCC) 03/20/2020   Acute on chronic combined systolic and diastolic CHF (congestive heart failure) (HCC) 10/08/2020   Acute on chronic HFrEF (heart failure with reduced ejection fraction) (HCC) 03/22/2020   Acute respiratory failure with hypoxia (HCC) 03/20/2020   Allergy    Arthritis    Atypical pneumonia 03/22/2020   CAD (coronary artery disease)    a. 03/2020 Cath: LM nl, LAD 60/46m, 55d, D1 70, D2 70, LCX 45ost/p, 65p, RCA 100ost CTO. RPDA fills via collats from dLAD. Inf septal fills via collats from 1st septal, RPAV fills via collats from LPAV, RPL1/2 sev dzs-->med rx.   Chickenpox    Chronic combined systolic (congestive) and diastolic (congestive) heart failure (HCC)    a. 02/2021 Echo: EF 40-45%, glob HK, gr2 DD. Sev red RV fxn, RVSP 69.70mmHg. Mod dil LA, sev dil RA.  Sev MR. Mild to mod MS. Mean MV grad 6.19mmHg. Mod-Sev TR. Mild AI. Mild Ao sclerosis w/o stenosis.   COPD (chronic obstructive pulmonary disease) (HCC)    Elevated troponin 03/22/2020   GERD (gastroesophageal reflux disease)    Headache(784.0)    Migraine   Hearing loss    Right ear secondary to infection   History of  shingles    Ischemic cardiomyopathy    a. 02/2021 Echo: EF 40-45%, glob HK.   Migraines    Multiple sclerosis (HCC)    Obese    Optic neuritis    PAH (pulmonary artery hypertension) (HCC)    a. 02/2021 Echo: RVSP 69.97mmHg.   Prolonged QT interval 03/20/2020   Severe mitral regurgitation    a. 02/2021 Echo: Severe MR w/ mild to mod MS.  Mean MV grad 6.67mmHg.   SIRS (systemic inflammatory response syndrome) (HCC) 03/20/2020    Social History   Socioeconomic History   Marital status: Divorced    Spouse name: Not on file   Number of children: 2   Years of education: Not on file   Highest education level: Some college, no degree  Occupational History   Not on file  Tobacco Use   Smoking status: Former    Current packs/day: 1.00    Types: Cigarettes   Smokeless tobacco: Never   Tobacco comments:    Former smoker 07/22/24  Vaping Use   Vaping status: Never Used  Substance and Sexual Activity   Alcohol use: Not Currently    Comment: Occasional   Drug use: Never   Sexual activity: Not on file  Other Topics Concern   Not on file  Social History Narrative   Right handed    Lives in one story home   Drinks Caffeine - Sweet Tea    Social Drivers of Health   Financial Resource Strain: Low Risk  (11/27/2023)   Overall Financial Resource Strain (CARDIA)    Difficulty of Paying Living Expenses: Not hard at all  Food Insecurity: No Food Insecurity (11/27/2023)   Hunger Vital Sign    Worried About Running Out of Food in the Last Year: Never true    Ran Out of Food in the Last Year: Never true  Transportation Needs: No Transportation Needs (11/27/2023)   PRAPARE - Administrator, Civil Service (Medical): No    Lack of Transportation (Non-Medical): No  Physical Activity: Inactive (11/27/2023)   Exercise Vital Sign    Days of Exercise per Week: 0 days    Minutes of Exercise per Session: 0 min  Stress: No Stress Concern Present (11/27/2023)   [employer] of  Occupational Health - Occupational Stress Questionnaire    Feeling of Stress : Only a little  Social Connections: Socially Isolated (11/27/2023)   Social Connection and Isolation Panel    Frequency of Communication with Friends and Family: Twice a week    Frequency of Social Gatherings with Friends and Family: Three times a week    Attends Religious Services: Never    Active Member of Clubs or Organizations: No    Attends Banker Meetings: Never    Marital Status: Divorced  Catering manager Violence: Not At Risk (11/27/2023)   Humiliation, Afraid, Rape, and Kick questionnaire    Fear of Current or Ex-Partner: No    Emotionally Abused: No    Physically Abused: No    Sexually Abused: No    Past Surgical History:  Procedure Laterality Date   COLONOSCOPY WITH  PROPOFOL  N/A 08/03/2020   Procedure: COLONOSCOPY WITH PROPOFOL ;  Surgeon: Therisa Bi, MD;  Location: [facility] ENDOSCOPY;  Service: Gastroenterology;  Laterality: N/A;   Ganglioneuroma     Resection   LEFT HEART CATH AND CORONARY ANGIOGRAPHY N/A 03/23/2020   Procedure: LEFT HEART CATH AND CORONARY ANGIOGRAPHY;  Surgeon: Anner Alm ORN, MD;  Location: [facility] INVASIVE CV LAB;  Service: Cardiovascular;  Laterality: N/A;   PILONIDAL CYST EXCISION     PILONIDAL CYST EXCISION  1983   TEE WITHOUT CARDIOVERSION N/A 03/18/2023   Procedure: TRANSESOPHAGEAL ECHOCARDIOGRAM (TEE);  Surgeon: Rolan Ezra RAMAN, MD;  Location: [facility] ORS;  Service: Cardiovascular;  Laterality: N/A;   TUMOR REMOVAL  2007/2008    Family History  Problem Relation Age of Onset   Skin cancer Mother    Lung cancer Father    Uterine cancer Sister    Heart disease Brother    Bipolar disorder Sister    Throat cancer Paternal Grandmother     Allergies  Allergen Reactions   Acthar  Hp [Corticotropin ] Other (See Comments)    Throat swelling and coughing up blood   Codeine    Prednisone     Current Outpatient Medications on File Prior to Visit  Medication Sig  Dispense Refill   amiodarone  (PACERONE ) 100 MG tablet Take 1 tablet (100 mg total) by mouth daily. 90 tablet 3   aspirin  EC 81 MG tablet Take 1 tablet (81 mg total) by mouth daily. Swallow whole. 30 tablet 0   atorvastatin  (LIPITOR) 40 MG tablet Take 1 tablet daily     DULoxetine  (CYMBALTA ) 20 MG capsule TAKE 1 CAPSULE (20 MG TOTAL) BY MOUTH DAILY. FOR ANXIETY AND PAIN. 90 capsule 2   FARXIGA  10 MG TABS tablet TAKE 1 TABLET BY MOUTH DAILY BEFORE BREAKFAST. 30 tablet 11   feeding supplement, ENSURE ENLIVE, (ENSURE ENLIVE) LIQD Take 237 mLs by mouth 2 (two) times daily between meals.     ferrous sulfate  325 (65 FE) MG EC tablet Take 1 tablet (325 mg total) by mouth daily. 90 tablet 3   fluticasone -salmeterol (ADVAIR DISKUS) 250-50 MCG/ACT AEPB INHALE 1 PUFF INTO THE LUNGS TWICE A DAY 60 each 5   levothyroxine  (SYNTHROID ) 25 MCG tablet TAKE 1 TABLET BY MOUTH DAILY BEFORE BREAKFAST. 90 tablet 1   metoprolol  succinate (TOPROL -XL) 25 MG 24 hr tablet TAKE 1 TABLET (25 MG TOTAL) BY MOUTH DAILY. 90 tablet 3   nitroGLYCERIN  (NITROSTAT ) 0.4 MG SL tablet Place 1 tablet (0.4 mg total) under the tongue every 5 (five) minutes as needed for chest pain. 20 tablet 5   omeprazole  (PRILOSEC) 40 MG capsule TAKE 1 CAPSULE (40 MG TOTAL) BY MOUTH DAILY. FOR HEARTBURN. 90 capsule 0   Pediatric Multiple Vitamins (FLINTSTONES MULTIVITAMIN) CHEW Chew by mouth.     potassium chloride  SA (KLOR-CON  M) 20 MEQ tablet TAKE 1 TABLET (20 MEQ) BY MOUTH TWICE DAILY. 180 tablet 1   Probiotic Product (PROBIOTIC ADVANCED) CAPS Take 1 capsule by mouth daily.     spironolactone  (ALDACTONE ) 25 MG tablet TAKE 1 TABLET BY MOUTH TWICE A DAY 90 tablet 3   torsemide  (DEMADEX ) 20 MG tablet TAKE 3 TABLETS (60 MG TOTAL) BY MOUTH 2 (TWO) TIMES DAILY. 540 tablet 1   trolamine salicylate (ASPERCREME) 10 % cream Apply 1 application  topically daily as needed for muscle pain (back or joint pain).     vitamin B-12 (CYANOCOBALAMIN ) 500 MCG tablet Take 500  mcg by mouth 4 (four) times a week. 4 times weekly  no more than 2000 MCG per week     Current Facility-Administered Medications on File Prior to Visit  Medication Dose Route Frequency Provider Last Rate Last Admin   albumin  human 25 % solution 25 g  25 g Intravenous Once Hackney, Tina A, FNP        BP 114/76   Pulse (!) 59   Temp (!) 97.2 F (36.2 C) (Temporal)   Ht 5' 3 (1.6 m)   Wt 159 lb (72.1 kg)   SpO2 98%   BMI 28.17 kg/m  Objective:   Physical Exam HENT:     Right Ear: There is no impacted cerumen.     Left Ear: There is no impacted cerumen.  Cardiovascular:     Rate and Rhythm: Normal rate and regular rhythm.  Pulmonary:     Effort: Pulmonary effort is normal.     Breath sounds: Normal breath sounds.  Musculoskeletal:     Cervical back: Neck supple.  Skin:    General: Skin is warm and dry.  Neurological:     Mental Status: She is alert and oriented to person, place, and time.  Psychiatric:        Mood and Affect: Mood normal.     Physical Exam        Assessment & Plan:  Hypothyroidism, unspecified type Assessment & Plan: No levothyroxine  in several months. Reviewed TSH from August 2025 which is above goal.  Discussed to resume levothyroxine  25 mcg daily.  We discussed appropriate instructions for administration.  Will recheck TSH in 2 months.  Orders: -     TSH; Future  Screening mammogram for breast cancer -     3D Screening Mammogram, Left and Right; Future  HFrEF (heart failure with reduced ejection fraction) (HCC) Assessment & Plan: Overall minimal fluid to abdomen.  Following with cardiology, office notes reviewed heart failure clinic office notes from August 2025.  Continue torsemide  60 mg BID. Continue amiodarone  100 mg.     Paroxysmal SVT (supraventricular tachycardia) (HCC) Assessment & Plan: Controlled.  Continue amiodarone  100 mg daily, metoprolol  succinate 25 mg daily.   Essential hypertension Assessment &  Plan: Controlled.  Continue metoprolol  succinate 25 mg daily, spironolactone  25 mg daily, torsemide  60 mg daily. Labs reviewed from July and August 2025.   Coronary artery disease involving native coronary artery of native heart without angina pectoris Assessment & Plan: Asymptomatic.  Continue atorvastatin  40 mg daily, aspirin  81 mg daily, blood pressure control.   Chronic migraine without aura with status migrainosus, not intractable Assessment & Plan: No concerns today.  Continue to monitor.   Centrilobular emphysema (HCC) Assessment & Plan: Controlled.  We discussed when to resume inhalers and to start with albuterol  first. We discussed that if she required recurrent use of her butyryl inhaler to start the Advair again.     Gastroesophageal reflux disease, unspecified whether esophagitis present Assessment & Plan: Controlled.  Continue omeprazole  40 mg daily.   Cirrhosis of liver with ascites, unspecified hepatic cirrhosis type [facility]) Assessment & Plan: Following with cardiology and GI.  Exam today stable. Continue torsemide  60 mg daily, spironolactone  25 mg daily   Multiple sclerosis (HCC) Assessment & Plan: Gradually progressing.  Following with neurology, office notes reviewed from October 2024. She will follow-up in October as scheduled   Stage 3b chronic kidney disease [facility]) Assessment & Plan: Reviewed labs from July and August 2025.  Continue Farxiga  10 mg daily, blood pressure control   Other iron  deficiency anemia Assessment &  Plan: Following with hematology, office labs and notes reviewed from July 2025.   Hypokalemia Assessment & Plan: Stable.  Continue potassium chloride  20 mEq twice daily.     Assessment and Plan Assessment & Plan         Comer MARLA Gaskins, NP    History of Present Illness

## 2024-09-05 MED ORDER — ATORVASTATIN CALCIUM 40 MG PO TABS: 40.0000 mg | ORAL_TABLET | Freq: Every day | ORAL | 3 refills | Status: AC

## 2024-09-05 NOTE — Addendum Note (Signed)
 Addended by: Tommye Lehenbauer A on: 09/05/2024 10:55 AM   Modules accepted: Orders

## 2024-09-08 ENCOUNTER — Other Ambulatory Visit: Payer: Self-pay | Admitting: Family

## 2024-09-08 ENCOUNTER — Other Ambulatory Visit: Payer: Self-pay | Admitting: Primary Care

## 2024-09-08 DIAGNOSIS — I10 Essential (primary) hypertension: Secondary | ICD-10-CM

## 2024-09-08 DIAGNOSIS — M150 Primary generalized (osteo)arthritis: Secondary | ICD-10-CM

## 2024-09-08 DIAGNOSIS — I5022 Chronic systolic (congestive) heart failure: Secondary | ICD-10-CM

## 2024-09-09 ENCOUNTER — Other Ambulatory Visit (HOSPITAL_COMMUNITY): Payer: Self-pay | Admitting: Cardiology

## 2024-09-14 NOTE — Progress Notes (Unsigned)
 NEUROLOGY FOLLOW UP OFFICE NOTE  Anita [name] [id number]  Assessment/Plan:   1  Multiple sclerosis - inactive secondary progressive 2  Migraine without aura, without status migrainosus, not intractable    DMT not indicated. Follow up with eye doctor Follow up one year  Total time spent in the chart and face to face with patient and daughter:  20 minutes     Subjective:  Anita [name] is a 63 year old right-handed female with CAD, prolonged QT, severe mitral regurg, CHF and arthritis who follows up for multiple sclerosis.  She is accompanied by her daughter who provides further history.  UPDATE: Current DMT:  None Other medications:  Cymbalta  20mg  daily (anxiety, pain, sleep)  Vision:  Vision is foggy.  She got new glasses and didn't improve.  Hasn't followed up with the eye doctor.   Hearing:  Decreased hearing over the past 3 months, both ears.  Has noticed ringing in ears.  Being treated with decongestants and antihistamines. If no improvement, plan is to get hearing test.  Motor:  generalized weakness.  Reports less endurance.  Home exercises are usually knee lifts from bed but does do a little bit of walking with walker.   Sensory:  numbness and tingling in both hands, worse on the right hand that radiates to the elbow.  Involves all fingers.  Occurs all day.  Pain:  Arthritis.  Gait:  Requires walker.  Outside home, she uses wheelchair Bowel/Bladder:  Partial bowel and bladder incontinence Fatigue:  yes Cognition:  Mild cognitive impairment Mood:  okay Sleep:  She now gets solid sleep during the day but is up at night.  However, she feels rested. Tried Benadryl  which doesn't help.  Melatonin was ineffective.  Cannot take much due to cardiac comorbidities. Appetite is improved.  Eating 3 normal meals a day since getting fluid taken off of her abdomen.    Migraines:  Infrequent. Stopped sumatriptan  due to CAD.  HISTORY:  Diagnosed with multiple sclerosis  in May 2000 presenting as optic neuritis (right and then left).  For several years before then, she noted occasional paresthesias.  For several years (early 2001 to 2010) she declined being on DMT.  During that period, she clinically did well.  Occasionally she had balance problems and may have had another episode of optic neuritis.  She was on Tysabri in 2010 until about 2012.  Subsequently stopped due to positive JC Virus positive.  She reportedly had significant decline in 2014 with worsening gait in which she resorted to using a walker, mild cognitive impairment and occasional fecal incontinence.  She thought it was just another flare up but symptoms never resolved.     She has had a decline since 2022.  She has multiple co-morbidities such as CHF and CAD.  She was in the hospital on at least 2 occasions over the past year with prolong rehab in which she was pretty much sedentary.       She has significant medical co-morbidities including COPD, rheumatic mitral regurgitation, CAD, orthostatic hypotension, combined systolic and diastolic heart failure, and iron -deficiency anemia    Past DMT:  Avonex (side effects), Copaxone, Tysabri (JC antibody positive)   Past medications:  H.P. Acthar  Gel (throat swelling, hemoptysis), Solu-Medrol  (reaction), prednisone (allergies), mirtazapine ,  midodrine  sumatriptan  25mg , B12, ferrous sulfate , MVI    Imaging: Unable to get MRIs now because she had trouble breathing when flat on the table.  With reduced fluid retention on abdomen, may be able  to tolerate better.    10/27/2003 MRI BRAIN W WO (personally reviewed):  increasing size and number of multiple intracranial lesions involving the periventricular subcortical white matter, consistent with progression of MS since previous study of April, 2002. 03/26/2001 MRI BRAIN W WO:  Small white matter lesions on the right are unchanged (compared to MRI from 04/05/99).  These lesions are suggestive but not diagnostic of  multiple sclerosis.  Other causes of small vessel ischemic disease could also give this appearance, including diabetes, hypertension, vasculitis, or migraine headaches. 04/05/1999 MRI BRAIN W WO:  There are multiple small nonspecific foci of increased T2 prolongation involving the cerebral white matter.  In this young patient with reported clinically suspected optic neuritis multiple sclerosis is a consideration.  Further considerations include causes for small vessel disease including diabetes, hypertension, vasculitis, collagen vascular disease, and migraines.   No know family history of MS.    PAST MEDICAL HISTORY: Past Medical History:  Diagnosis Date   Abnormality of gait 07/26/2013   Acute kidney injury superimposed on stage IIIa chronic kidney disease (HCC) 03/20/2020   Acute on chronic combined systolic and diastolic CHF (congestive heart failure) (HCC) 10/08/2020   Acute on chronic HFrEF (heart failure with reduced ejection fraction) (HCC) 03/22/2020   Acute respiratory failure with hypoxia (HCC) 03/20/2020   Allergy    Arthritis    Atypical pneumonia 03/22/2020   CAD (coronary artery disease)    a. 03/2020 Cath: LM nl, LAD 60/37m, 55d, D1 70, D2 70, LCX 45ost/p, 65p, RCA 100ost CTO. RPDA fills via collats from dLAD. Inf septal fills via collats from 1st septal, RPAV fills via collats from LPAV, RPL1/2 sev dzs-->med rx.   Chickenpox    Chronic combined systolic (congestive) and diastolic (congestive) heart failure (HCC)    a. 02/2021 Echo: EF 40-45%, glob HK, gr2 DD. Sev red RV fxn, RVSP 69.53mmHg. Mod dil LA, sev dil RA. Sev MR. Mild to mod MS. Mean MV grad 6.58mmHg. Mod-Sev TR. Mild AI. Mild Ao sclerosis w/o stenosis.   COPD (chronic obstructive pulmonary disease) (HCC)    Elevated troponin 03/22/2020   GERD (gastroesophageal reflux disease)    Headache(784.0)    Migraine   Hearing loss    Right ear secondary to infection   History of shingles    Ischemic cardiomyopathy    a.  02/2021 Echo: EF 40-45%, glob HK.   Migraines    Multiple sclerosis    Obese    Optic neuritis    PAH (pulmonary artery hypertension) (HCC)    a. 02/2021 Echo: RVSP 69.56mmHg.   Prolonged QT interval 03/20/2020   Severe mitral regurgitation    a. 02/2021 Echo: Severe MR w/ mild to mod MS.  Mean MV grad 6.42mmHg.   SIRS (systemic inflammatory response syndrome) (HCC) 03/20/2020    MEDICATIONS: Current Outpatient Medications on File Prior to Visit  Medication Sig Dispense Refill   amiodarone  (PACERONE ) 100 MG tablet Take 1 tablet (100 mg total) by mouth daily. 90 tablet 3   aspirin  EC 81 MG tablet Take 1 tablet (81 mg total) by mouth daily. Swallow whole. 30 tablet 0   atorvastatin  (LIPITOR) 40 MG tablet Take 1 tablet (40 mg total) by mouth daily. 90 tablet 3   DULoxetine  (CYMBALTA ) 20 MG capsule TAKE 1 CAPSULE (20 MG TOTAL) BY MOUTH DAILY. FOR ANXIETY AND PAIN. 90 capsule 2   FARXIGA  10 MG TABS tablet TAKE 1 TABLET BY MOUTH DAILY BEFORE BREAKFAST. 30 tablet 11   feeding supplement,  ENSURE ENLIVE, (ENSURE ENLIVE) LIQD Take 237 mLs by mouth 2 (two) times daily between meals.     ferrous sulfate  325 (65 FE) MG EC tablet Take 1 tablet (325 mg total) by mouth daily. 90 tablet 3   fluticasone -salmeterol (ADVAIR DISKUS) 250-50 MCG/ACT AEPB INHALE 1 PUFF INTO THE LUNGS TWICE A DAY 60 each 5   levothyroxine  (SYNTHROID ) 25 MCG tablet TAKE 1 TABLET BY MOUTH DAILY BEFORE BREAKFAST. 90 tablet 1   metoprolol  succinate (TOPROL -XL) 25 MG 24 hr tablet TAKE 1 TABLET (25 MG TOTAL) BY MOUTH DAILY. 90 tablet 3   nitroGLYCERIN  (NITROSTAT ) 0.4 MG SL tablet Place 1 tablet (0.4 mg total) under the tongue every 5 (five) minutes as needed for chest pain. 20 tablet 5   omeprazole  (PRILOSEC) 40 MG capsule TAKE 1 CAPSULE (40 MG TOTAL) BY MOUTH DAILY. FOR HEARTBURN. 90 capsule 0   Pediatric Multiple Vitamins (FLINTSTONES MULTIVITAMIN) CHEW Chew by mouth.     potassium chloride  SA (KLOR-CON  M) 20 MEQ tablet TAKE 1 TABLET (20  MEQ) BY MOUTH TWICE DAILY. 180 tablet 1   Probiotic Product (PROBIOTIC ADVANCED) CAPS Take 1 capsule by mouth daily.     spironolactone  (ALDACTONE ) 25 MG tablet TAKE 1 TABLET BY MOUTH TWICE A DAY 90 tablet 3   torsemide  (DEMADEX ) 20 MG tablet TAKE 3 TABLETS (60 MG TOTAL) BY MOUTH 2 (TWO) TIMES DAILY. 540 tablet 1   trolamine salicylate (ASPERCREME) 10 % cream Apply 1 application  topically daily as needed for muscle pain (back or joint pain).     vitamin B-12 (CYANOCOBALAMIN ) 500 MCG tablet Take 500 mcg by mouth 4 (four) times a week. 4 times weekly no more than 2000 MCG per week     Current Facility-Administered Medications on File Prior to Visit  Medication Dose Route Frequency Provider Last Rate Last Admin   albumin  human 25 % solution 25 g  25 g Intravenous Once Hackney, Tina A, FNP        ALLERGIES: Allergies  Allergen Reactions   Acthar  Hp [Corticotropin ] Other (See Comments)    Throat swelling and coughing up blood   Codeine    Prednisone     FAMILY HISTORY: Family History  Problem Relation Age of Onset   Skin cancer Mother    Lung cancer Father    Uterine cancer Sister    Heart disease Brother    Bipolar disorder Sister    Throat cancer Paternal Grandmother       Objective:  Blood pressure 108/69, pulse 77, height 5' 3 (1.6 m), weight 159 lb (72.1 kg), SpO2 95%. General: No acute distress.  Patient appears well-groomed.   Head:  Normocephalic/atraumatic Eyes:  Fundi examined but not visualized Neck: supple, no paraspinal tenderness, full range of motion Heart:  Regular rate and rhythm Neurological Exam: alert and oriented.  Speech fluent and not dysarthric, language intact.  CN II-XII intact. Bulk decreased and tone increased.  Muscle strength 5-/5 throughout.  Sensation to light touch intact.  Deep tendon reflexes 3+ patellars, otherwise 2+ throughout, Finger to nose testing with dysmetria bilaterally, worse on right.  In wheelchair.  Requires assistance to stand up.   Unsteady.   Juliene Dunnings, DO  CC: Comer Gaskins, NP

## 2024-09-15 ENCOUNTER — Ambulatory Visit (INDEPENDENT_AMBULATORY_CARE_PROVIDER_SITE_OTHER): Payer: Medicare Other | Admitting: Neurology

## 2024-09-15 ENCOUNTER — Encounter: Payer: Self-pay | Admitting: Neurology

## 2024-09-15 VITALS — BP 108/69 | HR 77 | Ht 63.0 in | Wt 159.0 lb

## 2024-09-15 DIAGNOSIS — G43009 Migraine without aura, not intractable, without status migrainosus: Secondary | ICD-10-CM | POA: Diagnosis not present

## 2024-09-15 DIAGNOSIS — G35D Multiple sclerosis, unspecified: Secondary | ICD-10-CM | POA: Diagnosis not present

## 2024-09-15 NOTE — Patient Instructions (Signed)
 Follow up with eye doctor Follow up for hearing test if PCP thinks it is necessary

## 2024-09-16 ENCOUNTER — Ambulatory Visit

## 2024-09-16 ENCOUNTER — Ambulatory Visit
Admission: RE | Admit: 2024-09-16 | Discharge: 2024-09-16 | Disposition: A | Discharge status: Home / Self Care | Source: Ambulatory Visit | Attending: Primary Care | Admitting: Primary Care

## 2024-09-16 DIAGNOSIS — Z1231 Encounter for screening mammogram for malignant neoplasm of breast: Secondary | ICD-10-CM | POA: Diagnosis not present

## 2024-09-20 ENCOUNTER — Ambulatory Visit: Payer: Self-pay | Admitting: Primary Care

## 2024-09-21 ENCOUNTER — Other Ambulatory Visit: Payer: Self-pay | Admitting: Primary Care

## 2024-09-21 DIAGNOSIS — R928 Other abnormal and inconclusive findings on diagnostic imaging of breast: Secondary | ICD-10-CM

## 2024-10-03 ENCOUNTER — Ambulatory Visit
Admission: RE | Admit: 2024-10-03 | Discharge: 2024-10-03 | Disposition: A | Discharge status: Home / Self Care | Source: Ambulatory Visit | Attending: Primary Care | Admitting: Primary Care

## 2024-10-03 DIAGNOSIS — R928 Other abnormal and inconclusive findings on diagnostic imaging of breast: Secondary | ICD-10-CM

## 2024-10-03 DIAGNOSIS — N6489 Other specified disorders of breast: Secondary | ICD-10-CM | POA: Diagnosis not present

## 2024-10-04 ENCOUNTER — Ambulatory Visit: Payer: Self-pay | Admitting: Primary Care

## 2024-10-04 ENCOUNTER — Other Ambulatory Visit: Payer: Self-pay | Admitting: Primary Care

## 2024-10-04 DIAGNOSIS — R928 Other abnormal and inconclusive findings on diagnostic imaging of breast: Secondary | ICD-10-CM

## 2024-10-22 ENCOUNTER — Other Ambulatory Visit: Payer: Self-pay | Admitting: Primary Care

## 2024-10-22 DIAGNOSIS — K219 Gastro-esophageal reflux disease without esophagitis: Secondary | ICD-10-CM

## 2024-11-04 ENCOUNTER — Ambulatory Visit (HOSPITAL_COMMUNITY)
Admission: RE | Admit: 2024-11-04 | Discharge: 2024-11-04 | Disposition: A | Discharge status: Home / Self Care | Source: Ambulatory Visit | Attending: Family | Admitting: Family

## 2024-11-04 DIAGNOSIS — I5022 Chronic systolic (congestive) heart failure: Secondary | ICD-10-CM | POA: Diagnosis not present

## 2024-11-04 LAB — ECHOCARDIOGRAM COMPLETE
Area-P 1/2: 3.06 cm2
Calc EF: 46.8 %
MV M vel: 5.96 m/s
MV Peak grad: 142.1 mmHg
MV VTI: 0.93 cm2
Radius: 0.7 cm
S' Lateral: 4.8 cm
Single Plane A2C EF: 43.5 %
Single Plane A4C EF: 48.2 %

## 2024-11-04 MED ORDER — PERFLUTREN LIPID MICROSPHERE
1.0000 mL | INTRAVENOUS | Status: AC | PRN
  Administered 2024-11-04: 2 mL via INTRAVENOUS

## 2024-11-29 ENCOUNTER — Ambulatory Visit

## 2024-11-29 VITALS — Ht 63.0 in | Wt 159.0 lb

## 2024-11-29 DIAGNOSIS — Z0000 Encounter for general adult medical examination without abnormal findings: Secondary | ICD-10-CM

## 2024-11-29 NOTE — Patient Instructions (Signed)
 [name],  Thank you for taking the time for your Medicare Wellness Visit. I appreciate your continued commitment to your health goals. Please review the care plan we discussed, and feel free to reach out if I can assist you further.  Please note that Annual Wellness Visits do not include a physical exam. Some assessments may be limited, especially if the visit was conducted virtually. If needed, we may recommend an in-person follow-up with your provider.  Ongoing Care Seeing your primary care provider every 3 to 6 months helps us  monitor your health and provide consistent, personalized care.   Referrals If a referral was made during today's visit and you haven't received any updates within two weeks, please contact the referred provider directly to check on the status.  Recommended Screenings:  Health Maintenance  Topic Date Due   Pneumococcal Vaccine for age over 10 (1 of 2 - PCV) Never done   Zoster (Shingles) Vaccine (1 of 2) Never done   Pap with HPV screening  03/19/2021   Hepatitis B Vaccine (1 of 3 - Risk 3-dose series) Never done   Medicare Annual Wellness Visit  11/26/2024   Flu Shot  03/07/2025*   Colon Cancer Screening  08/03/2025   Breast Cancer Screening  09/16/2026   Hepatitis C Screening  Completed   HIV Screening  Completed   HPV Vaccine  Aged Out   Meningitis B Vaccine  Aged Out   DTaP/Tdap/Td vaccine  Discontinued   COVID-19 Vaccine  Discontinued  *Topic was postponed. The date shown is not the original due date.       11/29/2024    3:40 PM  Advanced Directives  Does Patient Have a Medical Advance Directive? Yes  Type of Estate Agent of [place];Living will  Copy of Healthcare Power of Attorney in Chart? Yes - validated most recent copy scanned in chart (See row information)    Vision: Annual vision screenings are recommended for early detection of glaucoma, cataracts, and diabetic retinopathy. These exams can also reveal signs  of chronic conditions such as diabetes and high blood pressure.  Dental: Annual dental screenings help detect early signs of oral cancer, gum disease, and other conditions linked to overall health, including heart disease and diabetes.  Please see the attached documents for additional preventive care recommendations.

## 2024-11-29 NOTE — Progress Notes (Signed)
 "  Chief Complaint  Patient presents with   Medicare Wellness     Subjective:   Anita [name] is a 63 y.o. female who presents for a Medicare Annual Wellness Visit.  Visit info / Clinical Intake: Medicare Wellness Visit Type:: Subsequent Annual Wellness Visit Medicare Wellness Visit Mode:: Telephone If telephone:: video declined Since this visit was completed virtually, some vitals may be partially provided or unavailable. Missing vitals are due to the limitations of the virtual format.: Unable to obtain vitals - no equipment If Telephone or Video please confirm:: I connected with patient using audio/video enable telemedicine. I verified patient identity with two identifiers, discussed telehealth limitations, and patient agreed to proceed. Patient Location:: home Provider Location:: home office Interpreter Needed?: No Pre-visit prep was completed: yes Living arrangements:: with family/others Patient's Overall Health Status Rating: (!) fair Typical amount of pain: some Does pain affect daily life?: (!) yes Are you currently prescribed opioids?: no  Dietary Habits and Nutritional Risks How many meals a day?: 2 Eats fruit and vegetables daily?: yes Most meals are obtained by: having others provide food In the last 2 weeks, have you had any of the following?: none Diabetic:: no  Functional Status Activities of Daily Living (to include ambulation/medication): (!) Needs Assist Feeding: Independent Dressing/Grooming: Independent Bathing: Needs assistance Toileting: Independent Transfer: Independent with device- listed below Ambulation: Independent with device- listed below Home Assistive Devices/Equipment: Eyeglasses; Walker (specify Type); Wheelchair Medication Administration: Needs assistance (comment) Is this a change from baseline?: Pre-admission baseline Home Management (perform basic housework or laundry): Needs assistance (comment) Manage your own finances?:  yes Primary transportation is: family / friends Concerns about vision?: (!) yes (foggy vision;worse in lft eye) Concerns about hearing?: no  Fall Screening Falls in the past year?: 0 Number of falls in past year: 0 Was there an injury with Fall?: 0 Fall Risk Category Calculator: 0 Patient Fall Risk Level: Low Fall Risk  Fall Risk Patient at Risk for Falls Due to: No Fall Risks Fall risk Follow up: Falls evaluation completed; Education provided; Falls prevention discussed  Home and Transportation Safety: All rugs have non-skid backing?: N/A, no rugs All stairs or steps have railings?: N/A, no stairs Grab bars in the bathtub or shower?: yes Have non-skid surface in bathtub or shower?: yes Good home lighting?: yes Regular seat belt use?: yes Hospital stays in the last year:: no  Cognitive Assessment Difficulty concentrating, remembering, or making decisions? : yes Will 6CIT or Mini Cog be Completed: yes  Advance Directives (For Healthcare) Does Patient Have a Medical Advance Directive?: Yes Type of Advance Directive: Healthcare Power of [place]; Living will Copy of Healthcare Power of Attorney in Chart?: Yes - validated most recent copy scanned in chart (See row information) Copy of Living Will in Chart?: Yes - validated most recent copy scanned in chart (See row information)  Reviewed/Updated  Reviewed/Updated: Reviewed All (Medical, Surgical, Family, Medications, Allergies, Care Teams, Patient Goals)   Allergies (verified) Acthar  hp [corticotropin ], Codeine, and Prednisone   Current Medications (verified) Outpatient Encounter Medications as of 11/29/2024  Medication Sig   amiodarone  (PACERONE ) 100 MG tablet Take 1 tablet (100 mg total) by mouth daily.   aspirin  EC 81 MG tablet Take 1 tablet (81 mg total) by mouth daily. Swallow whole.   atorvastatin  (LIPITOR) 40 MG tablet Take 1 tablet (40 mg total) by mouth daily.   DULoxetine  (CYMBALTA ) 20 MG capsule TAKE 1 CAPSULE  (20 MG TOTAL) BY MOUTH DAILY. FOR ANXIETY AND PAIN.  FARXIGA  10 MG TABS tablet TAKE 1 TABLET BY MOUTH DAILY BEFORE BREAKFAST.   feeding supplement, ENSURE ENLIVE, (ENSURE ENLIVE) LIQD Take 237 mLs by mouth 2 (two) times daily between meals. (Patient taking differently: Take 237 mLs by mouth as needed.)   ferrous sulfate  325 (65 FE) MG EC tablet Take 1 tablet (325 mg total) by mouth daily.   fluticasone -salmeterol (ADVAIR DISKUS) 250-50 MCG/ACT AEPB INHALE 1 PUFF INTO THE LUNGS TWICE A DAY   levothyroxine  (SYNTHROID ) 25 MCG tablet TAKE 1 TABLET BY MOUTH DAILY BEFORE BREAKFAST.   metoprolol  succinate (TOPROL -XL) 25 MG 24 hr tablet TAKE 1 TABLET (25 MG TOTAL) BY MOUTH DAILY.   nitroGLYCERIN  (NITROSTAT ) 0.4 MG SL tablet Place 1 tablet (0.4 mg total) under the tongue every 5 (five) minutes as needed for chest pain.   omeprazole  (PRILOSEC) 40 MG capsule TAKE 1 CAPSULE (40 MG TOTAL) BY MOUTH DAILY. FOR HEARTBURN.   Pediatric Multiple Vitamins (FLINTSTONES MULTIVITAMIN) CHEW Chew by mouth.   potassium chloride  SA (KLOR-CON  M) 20 MEQ tablet TAKE 1 TABLET (20 MEQ) BY MOUTH TWICE DAILY.   Probiotic Product (PROBIOTIC ADVANCED) CAPS Take 1 capsule by mouth daily.   spironolactone  (ALDACTONE ) 25 MG tablet TAKE 1 TABLET BY MOUTH TWICE A DAY   torsemide  (DEMADEX ) 20 MG tablet TAKE 3 TABLETS (60 MG TOTAL) BY MOUTH 2 (TWO) TIMES DAILY. (Patient taking differently: Take 60 mg by mouth in the morning, at noon, and at bedtime.)   trolamine salicylate (ASPERCREME) 10 % cream Apply 1 application  topically daily as needed for muscle pain (back or joint pain).   vitamin B-12 (CYANOCOBALAMIN ) 500 MCG tablet Take 500 mcg by mouth 4 (four) times a week. 4 times weekly no more than 2000 MCG per week   Facility-Administered Encounter Medications as of 11/29/2024  Medication   albumin  human 25 % solution 25 g    History: Past Medical History:  Diagnosis Date   Abnormality of gait 07/26/2013   Acute kidney injury  superimposed on stage IIIa chronic kidney disease (HCC) 03/20/2020   Acute on chronic combined systolic and diastolic CHF (congestive heart failure) (HCC) 10/08/2020   Acute on chronic HFrEF (heart failure with reduced ejection fraction) (HCC) 03/22/2020   Acute respiratory failure with hypoxia (HCC) 03/20/2020   Allergy    Arthritis    Atypical pneumonia 03/22/2020   CAD (coronary artery disease)    a. 03/2020 Cath: LM nl, LAD 60/50m, 55d, D1 70, D2 70, LCX 45ost/p, 65p, RCA 100ost CTO. RPDA fills via collats from dLAD. Inf septal fills via collats from 1st septal, RPAV fills via collats from LPAV, RPL1/2 sev dzs-->med rx.   Chickenpox    Chronic combined systolic (congestive) and diastolic (congestive) heart failure (HCC)    a. 02/2021 Echo: EF 40-45%, glob HK, gr2 DD. Sev red RV fxn, RVSP 69.74mmHg. Mod dil LA, sev dil RA. Sev MR. Mild to mod MS. Mean MV grad 6.51mmHg. Mod-Sev TR. Mild AI. Mild Ao sclerosis w/o stenosis.   COPD (chronic obstructive pulmonary disease) (HCC)    Elevated troponin 03/22/2020   GERD (gastroesophageal reflux disease)    Headache(784.0)    Migraine   Hearing loss    Right ear secondary to infection   History of shingles    Ischemic cardiomyopathy    a. 02/2021 Echo: EF 40-45%, glob HK.   Migraines    Multiple sclerosis    Obese    Optic neuritis    PAH (pulmonary artery hypertension) (HCC)  a. 02/2021 Echo: RVSP 69.35mmHg.   Prolonged QT interval 03/20/2020   Severe mitral regurgitation    a. 02/2021 Echo: Severe MR w/ mild to mod MS.  Mean MV grad 6.61mmHg.   SIRS (systemic inflammatory response syndrome) (HCC) 03/20/2020   Past Surgical History:  Procedure Laterality Date   COLONOSCOPY WITH PROPOFOL  N/A 08/03/2020   Procedure: COLONOSCOPY WITH PROPOFOL ;  Surgeon: Therisa Bi, MD;  Location: [facility]COPY;  Service: Gastroenterology;  Laterality: N/A;   Ganglioneuroma     Resection   LEFT HEART CATH AND CORONARY ANGIOGRAPHY N/A 03/23/2020   Procedure:  LEFT HEART CATH AND CORONARY ANGIOGRAPHY;  Surgeon: Anner Alm ORN, MD;  Location: [facility] INVASIVE CV LAB;  Service: Cardiovascular;  Laterality: N/A;   PILONIDAL CYST EXCISION     PILONIDAL CYST EXCISION  1983   TEE WITHOUT CARDIOVERSION N/A 03/18/2023   Procedure: TRANSESOPHAGEAL ECHOCARDIOGRAM (TEE);  Surgeon: Rolan Ezra RAMAN, MD;  Location: [facility] ORS;  Service: Cardiovascular;  Laterality: N/A;   TUBAL LIGATION     TUMOR REMOVAL  2007/2008   Family History  Problem Relation Age of Onset   Skin cancer Mother    Lung cancer Father    Cancer Father    COPD Father    Uterine cancer Sister    Cancer Sister    Bipolar disorder Sister    Throat cancer Paternal Grandmother    Heart disease Brother    ADD / ADHD Son    Breast cancer Neg Hx    Social History   Occupational History   Not on file  Tobacco Use   Smoking status: Former    Current packs/day: 1.00    Types: Cigarettes   Smokeless tobacco: Never   Tobacco comments:    Former smoker 07/22/24  Vaping Use   Vaping status: Never Used  Substance and Sexual Activity   Alcohol use: Not Currently    Comment: Occasional   Drug use: Never   Sexual activity: Not on file   Tobacco Counseling Counseling given: Not Answered Tobacco comments: Former smoker 07/22/24  SDOH Screenings   Food Insecurity: No Food Insecurity (11/29/2024)  Housing: Unknown (11/29/2024)  Transportation Needs: No Transportation Needs (11/29/2024)  Utilities: Not At Risk (11/29/2024)  Alcohol Screen: Low Risk (11/27/2023)  Depression (PHQ2-9): Low Risk (11/29/2024)  Financial Resource Strain: Low Risk (11/27/2023)  Physical Activity: Inactive (11/29/2024)  Social Connections: Socially Isolated (11/29/2024)  Stress: No Stress Concern Present (11/29/2024)  Tobacco Use: Medium Risk (11/29/2024)  Health Literacy: Inadequate Health Literacy (11/29/2024)   See flowsheets for full screening details  Depression Screen PHQ 2 & 9 Depression Scale- Over  the past 2 weeks, how often have you been bothered by any of the following problems? Little interest or pleasure in doing things: 0 Feeling down, depressed, or hopeless (PHQ Adolescent also includes...irritable): 0 PHQ-2 Total Score: 0     Goals Addressed             This Visit's Progress    Be healthy and get stable enough to come out of Palliative visits       COMPLETED: Patient Stated       No goals            Objective:    Today's Vitals   11/29/24 1537  Weight: 159 lb (72.1 kg)  Height: 5' 3 (1.6 m)   Body mass index is 28.17 kg/m.  Hearing/Vision screen No results found. Immunizations and Health Maintenance Health Maintenance  Topic Date Due  Pneumococcal Vaccine: 50+ Years (1 of 2 - PCV) Never done   Zoster Vaccines- Shingrix (1 of 2) Never done   Cervical Cancer Screening (HPV/Pap Cotest)  03/19/2021   Hepatitis B Vaccines 19-59 Average Risk (1 of 3 - Risk 3-dose series) Never done   Medicare Annual Wellness (AWV)  11/26/2024   Influenza Vaccine  03/07/2025 (Originally 07/08/2024)   Colonoscopy  08/03/2025   Mammogram  09/16/2026   Hepatitis C Screening  Completed   HIV Screening  Completed   HPV VACCINES  Aged Out   Meningococcal B Vaccine  Aged Out   DTaP/Tdap/Td  Discontinued   COVID-19 Vaccine  Discontinued        Assessment/Plan:  This is a routine wellness examination for Netty.  Patient Care Team: Gretta Comer POUR, NP as PCP - General (Internal Medicine) Lonni Slain, MD as PCP - Cardiology (Cardiology) Babara Call, MD as Consulting Physician (Oncology) Skeet Juliene SAUNDERS, DO as Consulting Physician (Neurology)  I have personally reviewed and noted the following in the patients chart:   Medical and social history Use of alcohol, tobacco or illicit drugs  Current medications and supplements including opioid prescriptions. Functional ability and status Nutritional status Physical activity Advanced directives List of other  physicians Hospitalizations, surgeries, and ER visits in previous 12 months Vitals Screenings to include cognitive, depression, and falls Referrals and appointments  No orders of the defined types were placed in this encounter.  In addition, I have reviewed and discussed with patient certain preventive protocols, quality metrics, and best practice recommendations. A written personalized care plan for preventive services as well as general preventive health recommendations were provided to patient.   Erminio LITTIE Saris, LPN   [id number]   No follow-ups on file. Pt daughter states she will make appt with PCP in summer (declines to schedule now)  After Visit Summary: (MyChart) Due to this being a telephonic visit, the after visit summary with patients personalized plan was offered to patient via MyChart   Nurse Notes: No voiced or noted concerns at this time Appointment(s) made: (declines at this time as so many appts/; will schedule closer to summer per daughter/Cynthia) HM Addressed: no vaccines desired at this time  "

## 2024-12-07 ENCOUNTER — Telehealth: Payer: Self-pay | Admitting: Family

## 2024-12-07 NOTE — Telephone Encounter (Signed)
 Called to confirm/remind patient of [facility] appointment [facility] on [facility].   Appointment:   [x] Confirmed  [] Left mess   [] No answer/No voice mail  [] VM Full/unable to leave message  [] Phone not in service  Patient reminded to bring all medications and/or complete list.  Confirmed patient has transportation. Gave directions, instructed to utilize valet parking.

## 2024-12-08 NOTE — Progress Notes (Unsigned)
 "  Advanced Heart Failure Clinic Note    PCP: Gretta Comer POUR, NP  Primary Cardiologist: Shelda Bruckner, MD  HF cardiologist: Rolan Barrack, MD   Chief Complaint: shortness of breath   HPI:  [name] is a 64 y/o female with a history of CAD (occluded RCA with collaterals), SVT, COPD, GERD, multiple sclerosis, pulmonary HTN, cirrhosis likely secondary to RV failure, severe valvular disease, previous tobacco use, congestive hepatopathy and chronic heart failure. She has cirrhosis likely secondary to RV failure.   Echo 03/20/20: EF 40-45% with moderately elevated PA pressure, mild LAE, moderate/ severe MR  LHC done 03/23/20 showed: Hemodynamics: LV end diastolic pressure is moderately elevated. ----Coronary Angiography----- Ost RCA to Dist RCA lesion is 100% stenosed.-The PDA and PL system fills via faint collaterals from the AV groove LCx and LAD septals. Mid LAD-1 lesion is 60% stenosed. Mid LAD-2 lesion is 50% stenosed. Dist LAD lesion is 55% stenosed with 70% stenosed side branch in 2nd Diag. 1st Diag lesion is 70% stenosed. Ost Cx to Prox Cx lesion is 45% stenosed. Prox-MID Cx lesion is 65% stenosed.  MODERATE-SEVERE THREE-VESSEL CAD: 100% proximal RCA occlusion with left-to-right collaterals faintly filling PDA and PL system (AV groove LCx-PL and LAD septal-PDA) Diffuse moderate mid LAD disease with 60% to 50% stenosis. Tandem 50% and 65% proximal and mid LCx with the 65% lesion being the most significant lesion. Moderately elevated LVEDP of 18-20 mmHg  Echo 02/07/21: EF of 40-45% along with severely elevated PA pressure of 69.2 mmHg, moderate LAE, severe MR, mild/ moderate MS and moderate/severe TR.  Echo 08/22/22: EF of 30-35% along with mild LVH and severe MR/TR.   Admitted 09/16/22 due to SVT. Cardiology consult obtained. Given IV adenosine  and then amiodarone  drip. Lasix  gtt. Plan for EP consult. Discharged after 3 days.   TEE 03/18/23: EF 40% with moderate LAE/  severe RAE and severe MR with restricted posterior leaflet, peak RV-RA gradient 48 mmHg, severe TR. No PFO or ASD.   She presents today for a HF follow-up visit with a chief complaint of shortness of breath. Has associated fatigue, dizziness, sleeping well although during the day. Denies chest pain, palpitations, edema, abdominal distention.   Last paracenteses 12/14/23 with removal of 6.3 L.   ROS: All systems negative except as listed in HPI, PMH and Problem List.   Labs (1/24): K 4.2, creatinine 1.69, AST/ALT/bilirubin normal, LDL 56 Labs (4/24): K 4.3, creatinine 1.82 Labs (5/24): hgb 9.7, plts 201, K 4.9, creatinine 1.78, LFTs normal, albumin  3.4, TSH normal, BNP 2261 Labs (6/24): K 4.2, creatinine 1.62 Labs (9/24): K 4.1, creatinine 1.65, BNP 1439.7 Labs (10/24): K 5.1, creatinine 1.95, TSH 10.66 Labs (12/24): K 3.9, creatinine 1.79 Labs (01/24) LDL 56 Labs (04/25): K 5.0, creatinine 1.63 Labs (07/25): Hg 12.8, PLT 155 Labs (08/25): K 4.8, creatinine 1.85, LFT's normal, LDL 103, TSH 10.705  PMH: 1. CAD: LHC in 4/21 with 60% mLAD, 70% D1, 65% mLCx, occluded proximal RCA with collaterals. Medically managed.  2. COPD 3. H/o SVT 4. Multiple sclerosis: Secondary progressive.  5. Anemia of CKD 6. CKD stage 3 7. B12 deficiency.  8. Chronic systolic CHF: Mixed ischemic and valvular cardiomyopathy.   - Echo (9/23): EF 30-35%, mild LVH, severe RV dysfunction with moderate RV enlargement, severe MR, severe TR.  - TEE (4/24): EF 40%, moderate RV enlargement with moderate systolic dysfunction, severe functional MR with restricted posterior leaflet, peak RV-RA gradient 48 mmHg, severe TR. - Echo (11/25): EF 40-45%, akinesis  of the left ventricular, basal-mid inferolateral wall, mildly elevated PA pressure, severe LAE, severe MR with mean mitral valve gradient of 4.0 mmHg , no thrombus 9. Valvular heart disease: Severe MR and severe TR on 9/23 echo. Not candidate for surgical valve replacement.   - TEE (4/24): EF 40%, moderate RV enlargement with moderate systolic dysfunction, severe functional MR with restricted posterior leaflet, peak RV-RA gradient 48 mmHg, severe TR. 10. Cirrhosis: Suspect due to RV failure/hepatic congestion.  - H/o paracenteses.   SH:  Social History   Socioeconomic History   Marital status: Divorced    Spouse name: Not on file   Number of children: 2   Years of education: Not on file   Highest education level: Some college, no degree  Occupational History   Not on file  Tobacco Use   Smoking status: Former    Current packs/day: 1.00    Types: Cigarettes   Smokeless tobacco: Never   Tobacco comments:    Former smoker 07/22/24  Vaping Use   Vaping status: Never Used  Substance and Sexual Activity   Alcohol use: Not Currently    Comment: Occasional   Drug use: Never   Sexual activity: Not on file  Other Topics Concern   Not on file  Social History Narrative   Right handed    Lives in one story home   Drinks Caffeine - Sweet Tea    Social Drivers of Health   Tobacco Use: Medium Risk (11/29/2024)   Patient History    Smoking Tobacco Use: Former    Smokeless Tobacco Use: Never    Passive Exposure: Not on file  Financial Resource Strain: Low Risk (11/27/2023)   Overall Financial Resource Strain (CARDIA)    Difficulty of Paying Living Expenses: Not hard at all  Food Insecurity: No Food Insecurity (11/29/2024)   Epic    Worried About Radiation Protection Practitioner of Food in the Last Year: Never true    Ran Out of Food in the Last Year: Never true  Transportation Needs: No Transportation Needs (11/29/2024)   Epic    Lack of Transportation (Medical): No    Lack of Transportation (Non-Medical): No  Physical Activity: Inactive (11/29/2024)   Exercise Vital Sign    Days of Exercise per Week: 0 days    Minutes of Exercise per Session: 0 min  Stress: No Stress Concern Present (11/29/2024)   [employer] of Occupational Health - Occupational Stress  Questionnaire    Feeling of Stress: Not at all  Social Connections: Socially Isolated (11/29/2024)   Social Connection and Isolation Panel    Frequency of Communication with Friends and Family: Twice a week    Frequency of Social Gatherings with Friends and Family: Never    Attends Religious Services: Never    Database Administrator or Organizations: No    Attends Banker Meetings: Never    Marital Status: Divorced  Catering Manager Violence: Not At Risk (11/29/2024)   Epic    Fear of Current or Ex-Partner: No    Emotionally Abused: No    Physically Abused: No    Sexually Abused: No  Depression (PHQ2-9): Low Risk (11/29/2024)   Depression (PHQ2-9)    PHQ-2 Score: 0  Alcohol Screen: Low Risk (11/27/2023)   Alcohol Screen    Last Alcohol Screening Score (AUDIT): 0  Housing: Unknown (11/29/2024)   Epic    Unable to Pay for Housing in the Last Year: No    Number of Times Moved in  the Last Year: Not on file    Homeless in the Last Year: No  Utilities: Not At Risk (11/29/2024)   Epic    Threatened with loss of utilities: No  Health Literacy: Inadequate Health Literacy (11/29/2024)   B1300 Health Literacy    Frequency of need for help with medical instructions: Sometimes    FH:  Family History  Problem Relation Age of Onset   Skin cancer Mother    Lung cancer Father    Cancer Father    COPD Father    Uterine cancer Sister    Cancer Sister    Bipolar disorder Sister    Throat cancer Paternal Grandmother    Heart disease Brother    ADD / ADHD Son    Breast cancer Neg Hx      Current Outpatient Medications  Medication Sig Dispense Refill   amiodarone  (PACERONE ) 100 MG tablet Take 1 tablet (100 mg total) by mouth daily. 90 tablet 3   aspirin  EC 81 MG tablet Take 1 tablet (81 mg total) by mouth daily. Swallow whole. 30 tablet 0   atorvastatin  (LIPITOR) 40 MG tablet Take 1 tablet (40 mg total) by mouth daily. 90 tablet 3   DULoxetine  (CYMBALTA ) 20 MG capsule  TAKE 1 CAPSULE (20 MG TOTAL) BY MOUTH DAILY. FOR ANXIETY AND PAIN. 90 capsule 2   FARXIGA  10 MG TABS tablet TAKE 1 TABLET BY MOUTH DAILY BEFORE BREAKFAST. 30 tablet 11   feeding supplement, ENSURE ENLIVE, (ENSURE ENLIVE) LIQD Take 237 mLs by mouth 2 (two) times daily between meals. (Patient taking differently: Take 237 mLs by mouth as needed.)     ferrous sulfate  325 (65 FE) MG EC tablet Take 1 tablet (325 mg total) by mouth daily. 90 tablet 3   fluticasone -salmeterol (ADVAIR DISKUS) 250-50 MCG/ACT AEPB INHALE 1 PUFF INTO THE LUNGS TWICE A DAY 60 each 5   levothyroxine  (SYNTHROID ) 25 MCG tablet TAKE 1 TABLET BY MOUTH DAILY BEFORE BREAKFAST. 90 tablet 1   metoprolol  succinate (TOPROL -XL) 25 MG 24 hr tablet TAKE 1 TABLET (25 MG TOTAL) BY MOUTH DAILY. 90 tablet 3   nitroGLYCERIN  (NITROSTAT ) 0.4 MG SL tablet Place 1 tablet (0.4 mg total) under the tongue every 5 (five) minutes as needed for chest pain. 20 tablet 5   omeprazole  (PRILOSEC) 40 MG capsule TAKE 1 CAPSULE (40 MG TOTAL) BY MOUTH DAILY. FOR HEARTBURN. 90 capsule 2   Pediatric Multiple Vitamins (FLINTSTONES MULTIVITAMIN) CHEW Chew by mouth.     potassium chloride  SA (KLOR-CON  M) 20 MEQ tablet TAKE 1 TABLET (20 MEQ) BY MOUTH TWICE DAILY. 180 tablet 1   Probiotic Product (PROBIOTIC ADVANCED) CAPS Take 1 capsule by mouth daily.     spironolactone  (ALDACTONE ) 25 MG tablet TAKE 1 TABLET BY MOUTH TWICE A DAY 90 tablet 3   torsemide  (DEMADEX ) 20 MG tablet TAKE 3 TABLETS (60 MG TOTAL) BY MOUTH 2 (TWO) TIMES DAILY. (Patient taking differently: Take 60 mg by mouth in the morning, at noon, and at bedtime.) 540 tablet 1   trolamine salicylate (ASPERCREME) 10 % cream Apply 1 application  topically daily as needed for muscle pain (back or joint pain).     vitamin B-12 (CYANOCOBALAMIN ) 500 MCG tablet Take 500 mcg by mouth 4 (four) times a week. 4 times weekly no more than 2000 MCG per week     Current Facility-Administered Medications  Medication Dose Route  Frequency Provider Last Rate Last Admin   albumin  human 25 % solution 25 g  25 g Intravenous Once Donette Ellouise LABOR, [place]       Vitals:   12/09/24 1448  BP: 138/71  Pulse: 73  SpO2: 99%  Weight: 166 lb 12.8 oz (75.7 kg)   Wt Readings from Last 3 Encounters:  12/09/24 166 lb 12.8 oz (75.7 kg)  11/29/24 159 lb (72.1 kg)  09/15/24 159 lb (72.1 kg)   Lab Results  Component Value Date   CREATININE 1.85 (H) 07/22/2024   CREATININE 1.63 (H) 03/25/2024   CREATININE 1.79 (H) 11/25/2023    PHYSICAL EXAM:  General: Frail, elderly female in wheelchair Cor: No JVD. Regular rhythm, rate. 2/6 HSM at apex Lungs:expiratory wheezing LUL Abdomen: soft, nontender, nondistended. Extremities: no edema Neuro:. Affect pleasant   ECG: not done   ASSESSMENT & PLAN:  1: Chronic heart failure with reduced ejection fraction- - suspect mixed ischemic and valvular disease - NYHA class III - euvolemic today - weight up 11 pounds since her last visit here 4+ months ago.  - Echo 03/20/20: EF 40-45% with moderately elevated PA pressure, mild LAE, moderate/ severe MR - Echo 02/07/21: EF of 40-45% along with severely elevated PA pressure of 69.2 mmHg, moderate LAE, severe MR, mild/ moderate MS and moderate/severe TR.  - Echo 08/22/22: EF of 30-35% along with mild LVH and severe MR/TR.  - TEE 03/18/23: EF 40% with moderate LAE/ severe RAE and severe MR with restricted posterior leaflet, peak RV-RA gradient 48 mmHg, severe TR. No PFO or ASD.  - Echo (11/25): EF 40-45%, akinesis of the left ventricular, basal-mid inferolateral wall, mildly elevated PA pressure, severe LAE, severe MR with mean mitral valve gradient of 4.0 mmHg , no thrombus - not adding salt and daughter says that she doesn't cook with salt either - continue farxiga  10mg  daily - continue metoprolol  succinate 25mg  daily - continue potassium 20meq BID - continue spironolactone  25mg  BID - continue torsemide  60mg  BID - had been hypotensive with  entresto  in the past - continues with [facility] and is pleased with their service so far. Biweekly phone visit with the nurse and provider visits Q 3-6 months - BNP 08/20/23 was 1439.7  2: HTN- - BP controlled 138/71 - saw PCP Domenick) 09/25 - BMP 07/22/24 reviewed: sodium 134, potassium 4.8, creatinine 1.85 & GFR 31  - BMET today  3: CAD- - saw cardiology Frieda) 10/23 - continue ASA 81mg  daily - LHC done 03/23/20 showed: Hemodynamics: LV end diastolic pressure is moderately elevated. ----Coronary Angiography----- Ost RCA to Dist RCA lesion is 100% stenosed.-The PDA and PL system fills via faint collaterals from the AV groove LCx and LAD septals. Mid LAD-1 lesion is 60% stenosed. Mid LAD-2 lesion is 50% stenosed. Dist LAD lesion is 55% stenosed with 70% stenosed side branch in 2nd Diag. 1st Diag lesion is 70% stenosed. Ost Cx to Prox Cx lesion is 45% stenosed. Prox-MID Cx lesion is 65% stenosed.  MODERATE-SEVERE THREE-VESSEL CAD: 100% proximal RCA occlusion with left-to-right collaterals faintly filling PDA and PL system (AV groove LCx-PL and LAD septal-PDA) Diffuse moderate mid LAD disease with 60% to 50% stenosis. Tandem 50% and 65% proximal and mid LCx with the 65% lesion being the most significant lesion. Moderately elevated LVEDP of 18-20 mmHg  4: NSVT- - continue amiodarone  100mg  daily - required adenosine  to terminate 10/23 - needs regular eye exams - TSH 07/22/24 was 10.705. TSH today  5: Cirrhosis w/ ascites- - suspect cardiogenic cirrhosis due to RV failure - periodic paracentesis; last one done 01/25  with removal of 6.3L; If the patient eventually requires >/=2 paracenteses in a 30 day period, candidacy for formal evaluation by the [facility] will be assessed - saw GI Romero) 01/24 - LFT's 08/25 were normal  6: Valvular heart disease- - TEE 03/18/23: EF 40% with moderate LAE/ severe RAE and severe MR. No PFO or  ASD - Echo (11/25): EF 40-45%, akinesis of the left ventricular, basal-mid inferolateral wall, mildly elevated PA pressure, severe LAE, severe MR with mean mitral valve gradient of 4.0 mmHg , no thrombus - does not qualifiy for mTEER due to cirrhosis, MS and fraility. Dr Rolan discussed with structural heart team and even if it could be done, this would not fix her TR which is also a large contributing factor - continues with [facility]  7: Anemia- - saw hematology Milda) 0725 - has received IV venofer  in the past - Hg 06/29/24 was 12.8  8: HLD- - LDL 07/22/24 was 103 - continue atorvastatin  40mg  daily (increased 08/25) - lipid panel today   Return in 3 months to see Dr Rolan, sooner if needed.   I spent 25 minutes reviewing records, interviewing/ examing patient and managing plan/ orders.   Ellouise DELENA Class, FNP 12/08/2024  "

## 2024-12-09 ENCOUNTER — Ambulatory Visit: Attending: Family | Admitting: Family

## 2024-12-09 ENCOUNTER — Encounter: Payer: Self-pay | Admitting: Family

## 2024-12-09 VITALS — BP 138/71 | HR 73 | Wt 166.8 lb

## 2024-12-09 DIAGNOSIS — I340 Nonrheumatic mitral (valve) insufficiency: Secondary | ICD-10-CM | POA: Diagnosis not present

## 2024-12-09 DIAGNOSIS — I4729 Other ventricular tachycardia: Secondary | ICD-10-CM | POA: Diagnosis not present

## 2024-12-09 DIAGNOSIS — I10 Essential (primary) hypertension: Secondary | ICD-10-CM | POA: Diagnosis not present

## 2024-12-09 DIAGNOSIS — Z87891 Personal history of nicotine dependence: Secondary | ICD-10-CM | POA: Insufficient documentation

## 2024-12-09 DIAGNOSIS — R188 Other ascites: Secondary | ICD-10-CM | POA: Diagnosis not present

## 2024-12-09 DIAGNOSIS — K761 Chronic passive congestion of liver: Secondary | ICD-10-CM | POA: Insufficient documentation

## 2024-12-09 DIAGNOSIS — Z7982 Long term (current) use of aspirin: Secondary | ICD-10-CM | POA: Diagnosis not present

## 2024-12-09 DIAGNOSIS — E782 Mixed hyperlipidemia: Secondary | ICD-10-CM

## 2024-12-09 DIAGNOSIS — D631 Anemia in chronic kidney disease: Secondary | ICD-10-CM | POA: Insufficient documentation

## 2024-12-09 DIAGNOSIS — G35D Multiple sclerosis, unspecified: Secondary | ICD-10-CM | POA: Insufficient documentation

## 2024-12-09 DIAGNOSIS — E785 Hyperlipidemia, unspecified: Secondary | ICD-10-CM | POA: Insufficient documentation

## 2024-12-09 DIAGNOSIS — I130 Hypertensive heart and chronic kidney disease with heart failure and stage 1 through stage 4 chronic kidney disease, or unspecified chronic kidney disease: Secondary | ICD-10-CM | POA: Insufficient documentation

## 2024-12-09 DIAGNOSIS — I2510 Atherosclerotic heart disease of native coronary artery without angina pectoris: Secondary | ICD-10-CM | POA: Diagnosis not present

## 2024-12-09 DIAGNOSIS — I272 Pulmonary hypertension, unspecified: Secondary | ICD-10-CM | POA: Insufficient documentation

## 2024-12-09 DIAGNOSIS — I081 Rheumatic disorders of both mitral and tricuspid valves: Secondary | ICD-10-CM | POA: Diagnosis not present

## 2024-12-09 DIAGNOSIS — J449 Chronic obstructive pulmonary disease, unspecified: Secondary | ICD-10-CM | POA: Diagnosis not present

## 2024-12-09 DIAGNOSIS — I471 Supraventricular tachycardia, unspecified: Secondary | ICD-10-CM | POA: Insufficient documentation

## 2024-12-09 DIAGNOSIS — Z7984 Long term (current) use of oral hypoglycemic drugs: Secondary | ICD-10-CM | POA: Insufficient documentation

## 2024-12-09 DIAGNOSIS — K7460 Unspecified cirrhosis of liver: Secondary | ICD-10-CM

## 2024-12-09 DIAGNOSIS — I5022 Chronic systolic (congestive) heart failure: Secondary | ICD-10-CM | POA: Insufficient documentation

## 2024-12-09 DIAGNOSIS — K219 Gastro-esophageal reflux disease without esophagitis: Secondary | ICD-10-CM | POA: Diagnosis not present

## 2024-12-09 DIAGNOSIS — N183 Chronic kidney disease, stage 3 unspecified: Secondary | ICD-10-CM | POA: Insufficient documentation

## 2024-12-09 DIAGNOSIS — I472 Ventricular tachycardia, unspecified: Secondary | ICD-10-CM | POA: Diagnosis not present

## 2024-12-09 DIAGNOSIS — Z79899 Other long term (current) drug therapy: Secondary | ICD-10-CM | POA: Diagnosis not present

## 2024-12-09 DIAGNOSIS — D508 Other iron deficiency anemias: Secondary | ICD-10-CM | POA: Diagnosis not present

## 2024-12-09 NOTE — Patient Instructions (Signed)
 Medication Changes:  No medication changes today!  Lab Work:  Go downstairs to [facility] on [address] to have your blood work completed.  We will only call you if the results are abnormal or if the provider would like to make medication changes.  No news is good news.   Follow-Up in: Please follow up with the Advanced Heart Failure Clinic in May with Dr. Rolan.   Thank you for choosing [facility].    At the Advanced Heart Failure Clinic, you and your health needs are our priority. We have a designated team specialized in the treatment of Heart Failure. This Care Team includes your primary Heart Failure Specialized Cardiologist (physician), Advanced Practice Providers (APPs- Physician Assistants and Nurse Practitioners), and Pharmacist who all work together to provide you with the care you need, when you need it.   You may see any of the following providers on your designated Care Team at your next follow up:  Dr. Toribio Fuel Dr. Ezra Rolan Dr. Ria Commander Dr. Morene Brownie Ellouise Class, FNP Jaun Bash, RPH-CPP  Please be sure to bring in all your medications bottles to every appointment.   Need to Contact Us :  If you have any questions or concerns before your next appointment please send us  a message through [place] or call our office at [phone].    TO LEAVE A MESSAGE FOR THE NURSE SELECT OPTION 2, PLEASE LEAVE A MESSAGE INCLUDING: YOUR NAME DATE OF BIRTH CALL BACK NUMBER REASON FOR CALL**this is important as we prioritize the call backs  YOU WILL RECEIVE A CALL BACK THE SAME DAY AS LONG AS YOU CALL BEFORE 4:00 PM

## 2024-12-10 LAB — BASIC METABOLIC PANEL WITH GFR
BUN/Creatinine Ratio: 16 (ref 12–28)
BUN: 30 mg/dL — ABNORMAL HIGH (ref 8–27)
CO2: 16 mmol/L — ABNORMAL LOW (ref 20–29)
Calcium: 9.5 mg/dL (ref 8.7–10.3)
Chloride: 103 mmol/L (ref 96–106)
Creatinine, Ser: 1.85 mg/dL — ABNORMAL HIGH (ref 0.57–1.00)
Glucose: 95 mg/dL (ref 70–99)
Potassium: 4.7 mmol/L (ref 3.5–5.2)
Sodium: 137 mmol/L (ref 134–144)
eGFR: 31 mL/min/1.73 — ABNORMAL LOW

## 2024-12-10 LAB — LIPID PANEL
Chol/HDL Ratio: 3.5 ratio (ref 0.0–4.4)
Cholesterol, Total: 150 mg/dL (ref 100–199)
HDL: 43 mg/dL
LDL Chol Calc (NIH): 86 mg/dL (ref 0–99)
Triglycerides: 113 mg/dL (ref 0–149)
VLDL Cholesterol Cal: 21 mg/dL (ref 5–40)

## 2024-12-10 LAB — TSH: TSH: 11.7 u[IU]/mL — ABNORMAL HIGH (ref 0.450–4.500)

## 2024-12-11 ENCOUNTER — Other Ambulatory Visit: Payer: Self-pay | Admitting: Family

## 2024-12-11 ENCOUNTER — Ambulatory Visit: Payer: Self-pay | Admitting: Family

## 2024-12-11 DIAGNOSIS — E039 Hypothyroidism, unspecified: Secondary | ICD-10-CM

## 2024-12-12 NOTE — Telephone Encounter (Addendum)
 Please call patient's daughter:  I spoke with her heart care provider and we need to adjust her thyroid  medication, levothyroxine , to 50 mcg. We also need to repeat thyroid  function, lab only appointment is fine, in 6 weeks. Please schedule. She does not have to fast.   Let me know once you've spoken with her, thanks!   ----- Message from Ellouise DELENA Class sent at 12/11/2024  6:14 PM EST ----- FYI: will defer any synthroid  adjustment to you. Lipids improving and renal function is stable.

## 2024-12-18 ENCOUNTER — Other Ambulatory Visit: Payer: Self-pay | Admitting: Oncology

## 2025-01-04 ENCOUNTER — Inpatient Hospital Stay

## 2025-01-06 ENCOUNTER — Ambulatory Visit: Admitting: Oncology

## 2025-01-06 ENCOUNTER — Ambulatory Visit

## 2025-02-01 ENCOUNTER — Other Ambulatory Visit

## 2025-04-05 ENCOUNTER — Encounter

## 2025-09-18 ENCOUNTER — Ambulatory Visit: Admitting: Neurology
